# Patient Record
Sex: Female | Born: 1950 | Race: Black or African American | Hispanic: No | Marital: Married | Smoking: Never smoker
Health system: Southern US, Community
[De-identification: ages and names within clinical notes are randomized; demographics above are authoritative.]

## PROBLEM LIST (undated history)

## (undated) DIAGNOSIS — E119 Type 2 diabetes mellitus without complications: Secondary | ICD-10-CM

## (undated) DIAGNOSIS — I499 Cardiac arrhythmia, unspecified: Secondary | ICD-10-CM

## (undated) DIAGNOSIS — E041 Nontoxic single thyroid nodule: Secondary | ICD-10-CM

## (undated) DIAGNOSIS — K5790 Diverticulosis of intestine, part unspecified, without perforation or abscess without bleeding: Secondary | ICD-10-CM

## (undated) DIAGNOSIS — Z79899 Other long term (current) drug therapy: Secondary | ICD-10-CM

## (undated) DIAGNOSIS — Z7901 Long term (current) use of anticoagulants: Secondary | ICD-10-CM

## (undated) DIAGNOSIS — N12 Tubulo-interstitial nephritis, not specified as acute or chronic: Secondary | ICD-10-CM

## (undated) DIAGNOSIS — E785 Hyperlipidemia, unspecified: Secondary | ICD-10-CM

## (undated) DIAGNOSIS — K603 Anal fistula, unspecified: Secondary | ICD-10-CM

## (undated) DIAGNOSIS — N186 End stage renal disease: Secondary | ICD-10-CM

## (undated) DIAGNOSIS — I517 Cardiomegaly: Secondary | ICD-10-CM

## (undated) DIAGNOSIS — I440 Atrioventricular block, first degree: Secondary | ICD-10-CM

## (undated) DIAGNOSIS — I4510 Unspecified right bundle-branch block: Secondary | ICD-10-CM

## (undated) DIAGNOSIS — E1140 Type 2 diabetes mellitus with diabetic neuropathy, unspecified: Secondary | ICD-10-CM

## (undated) DIAGNOSIS — N200 Calculus of kidney: Secondary | ICD-10-CM

## (undated) DIAGNOSIS — E538 Deficiency of other specified B group vitamins: Secondary | ICD-10-CM

## (undated) DIAGNOSIS — N189 Chronic kidney disease, unspecified: Secondary | ICD-10-CM

## (undated) DIAGNOSIS — D649 Anemia, unspecified: Secondary | ICD-10-CM

## (undated) DIAGNOSIS — I4891 Unspecified atrial fibrillation: Secondary | ICD-10-CM

## (undated) DIAGNOSIS — R112 Nausea with vomiting, unspecified: Secondary | ICD-10-CM

## (undated) DIAGNOSIS — R011 Cardiac murmur, unspecified: Secondary | ICD-10-CM

## (undated) DIAGNOSIS — I38 Endocarditis, valve unspecified: Secondary | ICD-10-CM

## (undated) DIAGNOSIS — I350 Nonrheumatic aortic (valve) stenosis: Secondary | ICD-10-CM

## (undated) DIAGNOSIS — K289 Gastrojejunal ulcer, unspecified as acute or chronic, without hemorrhage or perforation: Secondary | ICD-10-CM

## (undated) DIAGNOSIS — E139 Other specified diabetes mellitus without complications: Secondary | ICD-10-CM

## (undated) DIAGNOSIS — G709 Myoneural disorder, unspecified: Secondary | ICD-10-CM

## (undated) DIAGNOSIS — D638 Anemia in other chronic diseases classified elsewhere: Secondary | ICD-10-CM

## (undated) DIAGNOSIS — M4802 Spinal stenosis, cervical region: Secondary | ICD-10-CM

## (undated) DIAGNOSIS — Z940 Kidney transplant status: Secondary | ICD-10-CM

## (undated) DIAGNOSIS — I10 Essential (primary) hypertension: Secondary | ICD-10-CM

## (undated) DIAGNOSIS — Z9889 Other specified postprocedural states: Secondary | ICD-10-CM

## (undated) DIAGNOSIS — Z7960 Long term (current) use of unspecified immunomodulators and immunosuppressants: Secondary | ICD-10-CM

## (undated) DIAGNOSIS — M810 Age-related osteoporosis without current pathological fracture: Secondary | ICD-10-CM

## (undated) DIAGNOSIS — E039 Hypothyroidism, unspecified: Secondary | ICD-10-CM

## (undated) DIAGNOSIS — R0600 Dyspnea, unspecified: Secondary | ICD-10-CM

## (undated) HISTORY — PX: A/V FISTULAGRAM: CATH118298

## (undated) HISTORY — PX: KIDNEY TRANSPLANT: SHX239

---

## 1998-12-26 HISTORY — PX: KIDNEY TRANSPLANT: SHX239

## 2009-02-04 HISTORY — PX: KIDNEY TRANSPLANT: SHX239

## 2014-07-11 HISTORY — PX: CARPAL TUNNEL RELEASE: SHX101

## 2018-07-11 HISTORY — PX: EYE SURGERY: SHX253

## 2018-09-21 DIAGNOSIS — Z940 Kidney transplant status: Secondary | ICD-10-CM | POA: Insufficient documentation

## 2018-09-21 DIAGNOSIS — I4891 Unspecified atrial fibrillation: Secondary | ICD-10-CM | POA: Insufficient documentation

## 2018-09-21 DIAGNOSIS — M810 Age-related osteoporosis without current pathological fracture: Secondary | ICD-10-CM | POA: Insufficient documentation

## 2020-03-11 HISTORY — PX: CATARACT EXTRACTION W/ INTRAOCULAR LENS IMPLANT: SHX1309

## 2020-10-15 ENCOUNTER — Encounter: Payer: Self-pay | Admitting: Ophthalmology

## 2020-10-19 ENCOUNTER — Other Ambulatory Visit: Admission: RE | Admit: 2020-10-19 | Payer: 59 | Source: Ambulatory Visit

## 2020-10-20 NOTE — Discharge Instructions (Signed)

## 2020-10-21 ENCOUNTER — Encounter
Admission: RE | Disposition: A | Discharge status: Home / Self Care | Payer: Self-pay | Source: Home / Self Care | Attending: Ophthalmology

## 2020-10-21 ENCOUNTER — Encounter: Payer: Self-pay | Admitting: Ophthalmology

## 2020-10-21 ENCOUNTER — Ambulatory Visit
Admission: RE | Admit: 2020-10-21 | Discharge: 2020-10-21 | Disposition: A | Discharge status: Home / Self Care | Payer: Medicare Other | Attending: Ophthalmology | Admitting: Ophthalmology

## 2020-10-21 ENCOUNTER — Ambulatory Visit: Payer: Medicare Other | Admitting: Anesthesiology

## 2020-10-21 ENCOUNTER — Other Ambulatory Visit: Payer: Self-pay

## 2020-10-21 DIAGNOSIS — Z940 Kidney transplant status: Secondary | ICD-10-CM | POA: Diagnosis not present

## 2020-10-21 DIAGNOSIS — Z7952 Long term (current) use of systemic steroids: Secondary | ICD-10-CM | POA: Diagnosis not present

## 2020-10-21 DIAGNOSIS — Z79899 Other long term (current) drug therapy: Secondary | ICD-10-CM | POA: Diagnosis not present

## 2020-10-21 DIAGNOSIS — H2512 Age-related nuclear cataract, left eye: Secondary | ICD-10-CM | POA: Insufficient documentation

## 2020-10-21 DIAGNOSIS — E1136 Type 2 diabetes mellitus with diabetic cataract: Secondary | ICD-10-CM | POA: Insufficient documentation

## 2020-10-21 DIAGNOSIS — Z794 Long term (current) use of insulin: Secondary | ICD-10-CM | POA: Diagnosis not present

## 2020-10-21 DIAGNOSIS — Z7901 Long term (current) use of anticoagulants: Secondary | ICD-10-CM | POA: Insufficient documentation

## 2020-10-21 HISTORY — PX: CATARACT EXTRACTION W/PHACO: SHX586

## 2020-10-21 HISTORY — DX: Type 2 diabetes mellitus without complications: E11.9

## 2020-10-21 HISTORY — DX: Unspecified atrial fibrillation: I48.91

## 2020-10-21 HISTORY — DX: Essential (primary) hypertension: I10

## 2020-10-21 HISTORY — DX: Myoneural disorder, unspecified: G70.9

## 2020-10-21 LAB — GLUCOSE, CAPILLARY
Glucose-Capillary: 148 mg/dL — ABNORMAL HIGH (ref 70–99)
Glucose-Capillary: 72 mg/dL (ref 70–99)
Glucose-Capillary: 92 mg/dL (ref 70–99)

## 2020-10-21 SURGERY — PHACOEMULSIFICATION, CATARACT, WITH IOL INSERTION
Anesthesia: Monitor Anesthesia Care | Site: Eye | Laterality: Left

## 2020-10-21 MED ORDER — MIDAZOLAM HCL 2 MG/2ML IJ SOLN
INTRAMUSCULAR | Status: DC | PRN
  Administered 2020-10-21: 1 mg via INTRAVENOUS

## 2020-10-21 MED ORDER — LIDOCAINE HCL (PF) 2 % IJ SOLN
INTRAOCULAR | Status: DC | PRN
  Administered 2020-10-21: 1 mL

## 2020-10-21 MED ORDER — FENTANYL CITRATE (PF) 100 MCG/2ML IJ SOLN
INTRAMUSCULAR | Status: DC | PRN
  Administered 2020-10-21 (×2): 50 ug via INTRAVENOUS

## 2020-10-21 MED ORDER — TETRACAINE HCL 0.5 % OP SOLN
1.0000 [drp] | OPHTHALMIC | Status: DC | PRN
  Administered 2020-10-21 (×3): 1 [drp] via OPHTHALMIC

## 2020-10-21 MED ORDER — EPINEPHRINE PF 1 MG/ML IJ SOLN
INTRAOCULAR | Status: DC | PRN
  Administered 2020-10-21: 77 mL via OPHTHALMIC

## 2020-10-21 MED ORDER — NA HYALUR & NA CHOND-NA HYALUR 0.4-0.35 ML IO KIT
PACK | INTRAOCULAR | Status: DC | PRN
  Administered 2020-10-21: 1 mL via INTRAOCULAR

## 2020-10-21 MED ORDER — DEXTROSE 50 % IV SOLN
25.0000 mL | Freq: Once | INTRAVENOUS | Status: AC
  Administered 2020-10-21: 25 mL via INTRAVENOUS

## 2020-10-21 MED ORDER — BRIMONIDINE TARTRATE-TIMOLOL 0.2-0.5 % OP SOLN
OPHTHALMIC | Status: DC | PRN
  Administered 2020-10-21: 1 [drp] via OPHTHALMIC

## 2020-10-21 MED ORDER — ARMC OPHTHALMIC DILATING DROPS
1.0000 "application " | OPHTHALMIC | Status: DC | PRN
  Administered 2020-10-21 (×3): 1 via OPHTHALMIC

## 2020-10-21 MED ORDER — MOXIFLOXACIN HCL 0.5 % OP SOLN
OPHTHALMIC | Status: DC | PRN
  Administered 2020-10-21: 0.2 mL via OPHTHALMIC

## 2020-10-21 SURGICAL SUPPLY — 24 items
CANNULA ANT/CHMB 27GA (MISCELLANEOUS) ×2 IMPLANT
GLOVE SURG TRIUMPH 8.0 PF LTX (GLOVE) ×6 IMPLANT
GOWN STRL REUS W/ TWL LRG LVL3 (GOWN DISPOSABLE) ×3 IMPLANT
GOWN STRL REUS W/TWL LRG LVL3 (GOWN DISPOSABLE) ×6
LENS IOL ACRSF IQ ULTRA 16.5 (Intraocular Lens) ×1 IMPLANT
LENS IOL ACRYSOF IQ 16.5 (Intraocular Lens) ×2 IMPLANT
MARKER SKIN DUAL TIP RULER LAB (MISCELLANEOUS) ×2 IMPLANT
NDL RETROBULBAR .5 NSTRL (NEEDLE) IMPLANT
NEEDLE CAPSULORHEX 25GA (NEEDLE) ×2 IMPLANT
NEEDLE FILTER BLUNT 18X 1/2SAF (NEEDLE) ×2
NEEDLE FILTER BLUNT 18X1 1/2 (NEEDLE) ×2 IMPLANT
PACK CATARACT BRASINGTON (MISCELLANEOUS) ×2 IMPLANT
PACK EYE AFTER SURG (MISCELLANEOUS) ×2 IMPLANT
PACK OPTHALMIC (MISCELLANEOUS) ×2 IMPLANT
RING MALYGIN 7.0 (MISCELLANEOUS) IMPLANT
SOLUTION OPHTHALMIC SALT (MISCELLANEOUS) ×2 IMPLANT
SUT ETHILON 10-0 CS-B-6CS-B-6 (SUTURE)
SUT VICRYL  9 0 (SUTURE)
SUT VICRYL 9 0 (SUTURE) IMPLANT
SUTURE EHLN 10-0 CS-B-6CS-B-6 (SUTURE) IMPLANT
SYR 3ML LL SCALE MARK (SYRINGE) ×4 IMPLANT
SYR TB 1ML LUER SLIP (SYRINGE) ×2 IMPLANT
WATER STERILE IRR 250ML POUR (IV SOLUTION) ×2 IMPLANT
WIPE NON LINTING 3.25X3.25 (MISCELLANEOUS) ×2 IMPLANT

## 2020-10-21 NOTE — H&P (Signed)
[facility]   Primary Care Physician:  Ezequiel Kayser, MD Ophthalmologist: Dr. Leandrew Koyanagi  Pre-Procedure History & Physical: HPI:  Jean Johnson is a 70 y.o. female here for ophthalmic surgery.   Past Medical History:  Diagnosis Date  . Afib ([place])    history of   . Diabetes mellitus without complication ([place])   . Hypertension   . Neuromuscular disorder ([place])    neuropathy    Past Surgical History:  Procedure Laterality Date  . KIDNEY TRANSPLANT     x 2     Prior to Admission medications   Medication Sig Start Date End Date Taking? Authorizing Provider  apixaban (ELIQUIS) 5 MG TABS tablet Take 5 mg by mouth 2 (two) times daily.   Yes [provider]  atorvastatin (LIPITOR) 10 MG tablet Take 10 mg by mouth daily.   Yes [provider]  diltiazem (CARDIZEM CD) 240 MG 24 hr capsule Take 240 mg by mouth daily.   Yes [provider]  ezetimibe (ZETIA) 10 MG tablet Take 10 mg by mouth daily.   Yes [provider]  flecainide (TAMBOCOR) 100 MG tablet Take 100 mg by mouth 2 (two) times daily.   Yes [provider]  insulin detemir (LEVEMIR) 100 UNIT/ML injection Inject 8-10 Units into the skin at bedtime.   Yes [provider]  insulin lispro (HUMALOG) 100 UNIT/ML KwikPen Inject into the skin in the morning and at bedtime.   Yes [provider]  lisinopril (ZESTRIL) 30 MG tablet Take 30 mg by mouth daily.   Yes [provider]  mycophenolate (CELLCEPT) 500 MG tablet Take by mouth 2 (two) times daily.   Yes [provider]  Potassium Citrate POWD by Does not apply route.   Yes [provider]  predniSONE (DELTASONE) 5 MG tablet Take 5 mg by mouth daily with breakfast.   Yes [provider]  tacrolimus (PROGRAF) 1 MG capsule Take 1 mg by mouth 2 (two) times daily.   Yes [provider]    Allergies as of 09/23/2020  . (Not on File)    History reviewed. No  pertinent family history.  Social History   Socioeconomic History  . Marital status: Married    Spouse name: Not on file  . Number of children: Not on file  . Years of education: Not on file  . Highest education level: Not on file  Occupational History  . Not on file  Tobacco Use  . Smoking status: Never Smoker  . Smokeless tobacco: Never Used  Substance and Sexual Activity  . Alcohol use: Not Currently  . Drug use: Not on file  . Sexual activity: Not on file  Other Topics Concern  . Not on file  Social History Narrative  . Not on file   Social Determinants of Health   Financial Resource Strain: Not on file  Food Insecurity: Not on file  Transportation Needs: Not on file  Physical Activity: Not on file  Stress: Not on file  Social Connections: Not on file  Intimate Partner Violence: Not on file    Review of Systems: See HPI, otherwise negative ROS  Physical Exam: BP (!) 184/69   Pulse 64   Temp 97.6 F (36.4 C)   Ht '5\' 1"'$  (1.549 m)   Wt 51.3 kg   SpO2 98%   BMI 21.35 kg/m  General:   Alert,  pleasant and cooperative in NAD Head:  Normocephalic and atraumatic. Lungs:  Clear  to auscultation.    Heart:  Regular rate and rhythm.   Impression/Plan: Jean Johnson is here for ophthalmic surgery.  Risks, benefits, limitations, and alternatives regarding ophthalmic surgery have been reviewed with the patient.  Questions have been answered.  All parties agreeable.   Leandrew Koyanagi, MD  10/21/2020, 11:56 AM

## 2020-10-21 NOTE — Op Note (Signed)
OPERATIVE NOTE  Emojean Wineberg [id number] 10/21/2020   PREOPERATIVE DIAGNOSIS:  Nuclear sclerotic cataract left eye. H25.12   POSTOPERATIVE DIAGNOSIS:    Nuclear sclerotic cataract left eye.     PROCEDURE:  Phacoemusification with posterior chamber intraocular lens placement of the left eye  Ultrasound time: Procedure(s) with comments: CATARACT EXTRACTION PHACO AND INTRAOCULAR LENS PLACEMENT (IOC) DIABETES LEFT (Left) - 2.84 0:48.8 5.8%  LENS:   Implant Name Type Inv. Item Serial No. Manufacturer Lot No. LRB No. Used Action  LENS IOL ACRYSOF IQ 16.5 - [id number] Intraocular Lens LENS IOL ACRYSOF IQ 16.5 [id number] ALCON  Left 1 Implanted      SURGEON:  Wyonia Hough, MD   ANESTHESIA:  Topical with tetracaine drops and 2% Xylocaine jelly, augmented with 1% preservative-free intracameral lidocaine.    COMPLICATIONS:  None.   DESCRIPTION OF PROCEDURE:  The patient was identified in the holding room and transported to the operating room and placed in the supine position under the operating microscope.  The left eye was identified as the operative eye and it was prepped and draped in the usual sterile ophthalmic fashion.   A 1 millimeter clear-corneal paracentesis was made at the 1:30 position.  0.5 ml of preservative-free 1% lidocaine was injected into the anterior chamber.  The anterior chamber was filled with Viscoat viscoelastic.  A 2.4 millimeter keratome was used to make a near-clear corneal incision at the 10:30 position.  .  A curvilinear capsulorrhexis was made with a cystotome and capsulorrhexis forceps.  Balanced salt solution was used to hydrodissect and hydrodelineate the nucleus.   Phacoemulsification was then used in stop and chop fashion to remove the lens nucleus and epinucleus.  The remaining cortex was then removed using the irrigation and aspiration handpiece. Provisc was then placed into the capsular bag to distend it for lens placement.  A lens was then  injected into the capsular bag.  The remaining viscoelastic was aspirated.   Wounds were hydrated with balanced salt solution.  The anterior chamber was inflated to a physiologic pressure with balanced salt solution.  No wound leaks were noted. Vigamox 0.2 ml of a '1mg'$  per ml solution was injected into the anterior chamber for a dose of 0.2 mg of intracameral antibiotic at the completion of the case.   Timolol and Brimonidine drops were applied to the eye.  The patient was taken to the recovery room in stable condition without complications of anesthesia or surgery.  Gillian Meeuwsen 10/21/2020, 1:21 PM

## 2020-10-21 NOTE — Anesthesia Preprocedure Evaluation (Signed)
Anesthesia Evaluation  Patient identified by MRN, date of birth, ID band Patient awake    History of Anesthesia Complications Negative for: history of anesthetic complications  Airway Mallampati: II  TM Distance: >3 FB Neck ROM: Full    Dental no notable dental hx.    Pulmonary neg pulmonary ROS,    Pulmonary exam normal        Cardiovascular Exercise Tolerance: Good hypertension, Pt. 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)     Musculoskeletal   Abdominal   Peds  Hematology negative hematology ROS (+)   Anesthesia Other Findings   Reproductive/Obstetrics                             Anesthesia Physical Anesthesia Plan  ASA: III  Anesthesia Plan: MAC   Post-op Pain Management:    Induction: Intravenous  PONV Risk Score and Plan: 2 and Midazolam, TIVA and Treatment may vary due to age or medical condition  Airway Management Planned: Nasal Cannula and Natural Airway  Additional Equipment: None  Intra-op Plan:   Post-operative Plan:   Informed Consent: I have reviewed the patients History and Physical, chart, labs and discussed the procedure including the risks, benefits and alternatives for the proposed anesthesia with the patient or authorized representative who has indicated his/her understanding and acceptance.       Plan Discussed with: CRNA  Anesthesia Plan Comments:         Anesthesia Quick Evaluation

## 2020-10-21 NOTE — Anesthesia Procedure Notes (Signed)
Procedure Name: MAC Performed by: Demetrice Amstutz, CRNA Pre-anesthesia Checklist: Patient identified, Emergency Drugs available, Suction available, Timeout performed and Patient being monitored Patient Re-evaluated:Patient Re-evaluated prior to induction Oxygen Delivery Method: Nasal cannula Placement Confirmation: positive ETCO2       

## 2020-10-21 NOTE — Anesthesia Postprocedure Evaluation (Signed)
Anesthesia Post Note  Patient: Jean Johnson  Procedure(s) Performed: CATARACT EXTRACTION PHACO AND INTRAOCULAR LENS PLACEMENT (IOC) DIABETES LEFT (Left Eye)     Patient location during evaluation: PACU Anesthesia Type: MAC Level of consciousness: awake and alert Pain management: pain level controlled Vital Signs Assessment: post-procedure vital signs reviewed and stable Respiratory status: spontaneous breathing Cardiovascular status: blood pressure returned to baseline Postop Assessment: no apparent nausea or vomiting, adequate PO intake and no headache Anesthetic complications: no   No complications documented.  Adele Barthel Sabino Denning

## 2020-10-21 NOTE — Transfer of Care (Signed)
Immediate Anesthesia Transfer of Care Note  Patient: Jean Johnson  Procedure(s) Performed: CATARACT EXTRACTION PHACO AND INTRAOCULAR LENS PLACEMENT (IOC) DIABETES LEFT (Left Eye)  Patient Location: PACU  Anesthesia Type: MAC  Level of Consciousness: awake, alert  and patient cooperative  Airway and Oxygen Therapy: Patient Spontanous Breathing and Patient connected to supplemental oxygen  Post-op Assessment: Post-op Vital signs reviewed, Patient's Cardiovascular Status Stable, Respiratory Function Stable, Patent Airway and No signs of Nausea or vomiting  Post-op Vital Signs: Reviewed and stable  Complications: No complications documented.

## 2020-10-22 ENCOUNTER — Encounter: Payer: Self-pay | Admitting: Ophthalmology

## 2020-11-16 ENCOUNTER — Other Ambulatory Visit: Payer: Self-pay

## 2020-11-16 ENCOUNTER — Ambulatory Visit (INDEPENDENT_AMBULATORY_CARE_PROVIDER_SITE_OTHER): Payer: Medicare Other | Admitting: Urology

## 2020-11-16 VITALS — BP 165/73 | HR 71 | Ht 61.0 in | Wt 112.0 lb

## 2020-11-16 DIAGNOSIS — N3941 Urge incontinence: Secondary | ICD-10-CM | POA: Diagnosis not present

## 2020-11-16 LAB — BLADDER SCAN AMB NON-IMAGING

## 2020-11-16 NOTE — Progress Notes (Signed)
11/16/2020 2:11 PM   Jean Johnson [birth year] [id number]  Referring provider: Ezequiel Kayser, MD [facility] [place],  [place] [address]  Chief Complaint  Patient presents with  . Urinary Incontinence    HPI: I was consulted to assess the patient who moved here from [place].  She has noticed some bulging in the vagina from her uterus for 18 months.  She has mild incontinence and wanted to find a urologist  Sometimes she has urgency incontinence.  No stress incontinence or bedwetting.  Wears 2 depends a day.  She thinks usually they are damp and some of the history was difficult to sort out at times.  She voids every 2 or 3 hours and gets up at least 3 times a night.  She has ankle edema.  She has had 2 kidney transplant 1 in year 2001 and 2010.  She has a shunt but does not do dialysis  Has not had a hysterectomy.  Is an insulin-dependent diabetic and may need neck surgery  No previous bladder surgery.       PMH: Past Medical History:  Diagnosis Date  . Afib ([place])    history of   . Diabetes mellitus without complication ([place])   . Hypertension   . Neuromuscular disorder [facility])    neuropathy    Surgical History: Past Surgical History:  Procedure Laterality Date  . CATARACT EXTRACTION W/PHACO Left 10/21/2020   Procedure: CATARACT EXTRACTION PHACO AND INTRAOCULAR LENS PLACEMENT ([place]) DIABETES LEFT;  Surgeon: Leandrew Koyanagi, MD;  Location: [facility];  Service: Ophthalmology;  Laterality: Left;  2.84 0:48.8 5.8%  . KIDNEY TRANSPLANT     x 2     Home Medications:  Allergies as of 11/16/2020      Reactions   E-mycin [erythromycin] Swelling   And itching   Penicillins Swelling   Throat      Medication List       Accurate as of Nov 16, 2020  2:11 PM. If you have any questions, ask your nurse or doctor.        atorvastatin 10 MG tablet Commonly known as: LIPITOR Take 10 mg by mouth daily.   diltiazem 240 MG 24 hr  capsule Commonly known as: CARDIZEM CD Take 240 mg by mouth daily.   Eliquis 5 MG Tabs tablet Generic drug: apixaban Take 5 mg by mouth 2 (two) times daily.   ezetimibe 10 MG tablet Commonly known as: ZETIA Take 10 mg by mouth daily.   flecainide 100 MG tablet Commonly known as: TAMBOCOR Take 100 mg by mouth 2 (two) times daily.   insulin detemir 100 UNIT/ML injection Commonly known as: LEVEMIR Inject 8-10 Units into the skin at bedtime.   insulin lispro 100 UNIT/ML KwikPen Commonly known as: HUMALOG Inject into the skin in the morning and at bedtime.   lisinopril 30 MG tablet Commonly known as: ZESTRIL Take 30 mg by mouth daily.   mycophenolate 500 MG tablet Commonly known as: CELLCEPT Take by mouth 2 (two) times daily.   Potassium Citrate Powd by Does not apply route.   predniSONE 5 MG tablet Commonly known as: DELTASONE Take 5 mg by mouth daily with breakfast.   tacrolimus 1 MG capsule Commonly known as: PROGRAF Take 1 mg by mouth 2 (two) times daily.       Allergies:  Allergies  Allergen Reactions  . E-Mycin [Erythromycin] Swelling    And itching   . Penicillins Swelling  Throat     Family History: No family history on file.  Social History:  reports that she has never smoked. She has never used smokeless tobacco. She reports previous alcohol use. No history on file for drug use.  ROS:                                        Physical Exam: BP (!) 165/73   Pulse 71   Ht '5\' 1"'$  (1.549 m)   Wt 50.8 kg   BMI 21.16 kg/m   Constitutional:  Alert and oriented, No acute distress. HEENT: [place] AT, moist mucus membranes.  Trachea midline, no masses. Cardiovascular: No clubbing, cyanosis, or edema. Respiratory: Normal respiratory effort, no increased work of breathing. GI: Abdomen is soft, nontender, nondistended, no abdominal masses GU: On pelvic examination patient has a grade 3 cystocele with some shortening of the anterior  vaginal wall.  The uterus and cervix descended to the introitus.  No stress incontinence with prolapse reduced.  No rectocele with prolapse reduced. Skin: No rashes, bruises or suspicious lesions. Lymph: No cervical or inguinal adenopathy. Neurologic: Grossly intact, no focal deficits, moving all 4 extremities. Psychiatric: Normal mood and affect.  Laboratory Data: No results found for: WBC, HGB, HCT, MCV, PLT  No results found for: CREATININE  No results found for: PSA  No results found for: TESTOSTERONE  No results found for: HGBA1C  Urinalysis No results found for: COLORURINE, APPEARANCEUR, LABSPEC, PHURINE, GLUCOSEU, HGBUR, BILIRUBINUR, KETONESUR, PROTEINUR, UROBILINOGEN, NITRITE, LEUKOCYTESUR  Pertinent Imaging: Urine reviewed.  Urine sent for culture.  Chart reviewed.  Post void residual 113 mL  Assessment & Plan: Patient has mild urge incontinence.  She has prolapse symptoms.  She has had 2 kidney transplant.  She uses a cane at home but otherwise a wheelchair when she goes out in public  Picture was drawn.  Watchful waiting and pessary recommended.  In my opinion prolapse surgery would not be a good option with her renal transplants.  She actually tolerates her symptoms very well.  The role of medical therapy for overactive bladder discussed.  Recent lab test noted that her glomerular filtration rate was 66 mils per minute  I was not surprised and patient chose watchful waiting See primary care for hemorrhoid and mild rectal prolapse  1. Urge incontinence of urine  - Urinalysis, Complete - Bladder Scan (Post Void Residual) in office   No follow-ups on file.  Reece Packer, MD  [name] [address] [place], [place] [address] [phone]

## 2020-11-17 LAB — MICROSCOPIC EXAMINATION: Bacteria, UA: NONE SEEN

## 2020-11-17 LAB — URINALYSIS, COMPLETE
Bilirubin, UA: NEGATIVE
Glucose, UA: NEGATIVE
Ketones, UA: NEGATIVE
Leukocytes,UA: NEGATIVE
Nitrite, UA: NEGATIVE
Specific Gravity, UA: >1.03 — ABNORMAL HIGH (ref 1.005–1.030)
Urobilinogen, Ur: 0.2 mg/dL (ref 0.2–1.0)
pH, UA: 6 (ref 5.0–7.5)

## 2020-11-20 LAB — CULTURE, URINE COMPREHENSIVE

## 2020-12-03 DIAGNOSIS — E119 Type 2 diabetes mellitus without complications: Secondary | ICD-10-CM | POA: Insufficient documentation

## 2020-12-03 DIAGNOSIS — D649 Anemia, unspecified: Secondary | ICD-10-CM | POA: Insufficient documentation

## 2020-12-03 DIAGNOSIS — I10 Essential (primary) hypertension: Secondary | ICD-10-CM | POA: Insufficient documentation

## 2020-12-03 DIAGNOSIS — E039 Hypothyroidism, unspecified: Secondary | ICD-10-CM | POA: Insufficient documentation

## 2020-12-10 ENCOUNTER — Other Ambulatory Visit: Payer: Self-pay | Admitting: Nephrology

## 2020-12-10 ENCOUNTER — Other Ambulatory Visit (HOSPITAL_COMMUNITY): Payer: Self-pay | Admitting: Nephrology

## 2020-12-10 DIAGNOSIS — E876 Hypokalemia: Secondary | ICD-10-CM

## 2020-12-10 DIAGNOSIS — D631 Anemia in chronic kidney disease: Secondary | ICD-10-CM

## 2020-12-10 DIAGNOSIS — N189 Chronic kidney disease, unspecified: Secondary | ICD-10-CM

## 2020-12-10 DIAGNOSIS — R8290 Unspecified abnormal findings in urine: Secondary | ICD-10-CM

## 2020-12-16 ENCOUNTER — Other Ambulatory Visit (HOSPITAL_COMMUNITY): Payer: Self-pay | Admitting: Neurology

## 2020-12-16 ENCOUNTER — Other Ambulatory Visit: Payer: Self-pay | Admitting: Neurology

## 2020-12-16 DIAGNOSIS — M47812 Spondylosis without myelopathy or radiculopathy, cervical region: Secondary | ICD-10-CM

## 2020-12-29 ENCOUNTER — Ambulatory Visit
Admission: RE | Admit: 2020-12-29 | Discharge: 2020-12-29 | Disposition: A | Discharge status: Home / Self Care | Payer: Medicare Other | Source: Ambulatory Visit | Attending: Neurology | Admitting: Neurology

## 2020-12-29 ENCOUNTER — Other Ambulatory Visit: Payer: Self-pay

## 2020-12-29 DIAGNOSIS — M47812 Spondylosis without myelopathy or radiculopathy, cervical region: Secondary | ICD-10-CM | POA: Insufficient documentation

## 2021-01-08 ENCOUNTER — Ambulatory Visit
Admission: RE | Admit: 2021-01-08 | Discharge: 2021-01-08 | Disposition: A | Discharge status: Home / Self Care | Payer: Medicare Other | Source: Ambulatory Visit | Attending: Nephrology | Admitting: Nephrology

## 2021-01-08 ENCOUNTER — Other Ambulatory Visit: Payer: Self-pay

## 2021-01-08 DIAGNOSIS — D631 Anemia in chronic kidney disease: Secondary | ICD-10-CM | POA: Diagnosis present

## 2021-01-08 DIAGNOSIS — E876 Hypokalemia: Secondary | ICD-10-CM

## 2021-01-08 DIAGNOSIS — N189 Chronic kidney disease, unspecified: Secondary | ICD-10-CM | POA: Insufficient documentation

## 2021-01-08 DIAGNOSIS — R8290 Unspecified abnormal findings in urine: Secondary | ICD-10-CM

## 2021-01-28 ENCOUNTER — Other Ambulatory Visit: Payer: Self-pay

## 2021-01-28 ENCOUNTER — Ambulatory Visit: Payer: Medicare Other | Attending: Neurology | Admitting: Physical Therapy

## 2021-01-28 DIAGNOSIS — R2689 Other abnormalities of gait and mobility: Secondary | ICD-10-CM | POA: Diagnosis present

## 2021-01-28 DIAGNOSIS — R262 Difficulty in walking, not elsewhere classified: Secondary | ICD-10-CM | POA: Diagnosis present

## 2021-01-28 DIAGNOSIS — R42 Dizziness and giddiness: Secondary | ICD-10-CM | POA: Insufficient documentation

## 2021-01-28 NOTE — Therapy (Signed)
[place] [facility] [address] [place], [place], [address] Phone: [phone]   Fax:  [phone]  Physical Therapy Evaluation  Patient Details  Name: Jean Johnson MRN: [id number] Date of Birth: 1950/09/01 Referring Provider (PT): Dr. Vladimir Crofts  Encounter Date: 01/28/2021   PT End of Session - 01/29/21 1000     Visit Number 1    Number of Visits 17    Date for PT Re-Evaluation 03/25/21    Authorization Time Period initial eval 01/28/21    Progress Note Due on Visit 10    PT Start Time 0948    PT Stop Time 1035    PT Time Calculation (min) 47 min    Activity Tolerance Patient tolerated treatment well    Behavior During Therapy [facility] for tasks assessed/performed             Past Medical History:  Diagnosis Date   Afib ([place])    history of    Diabetes mellitus without complication ([place])    Hypertension    Neuromuscular disorder ([place])    neuropathy    Past Surgical History:  Procedure Laterality Date   CATARACT EXTRACTION W/PHACO Left 10/21/2020   Procedure: CATARACT EXTRACTION PHACO AND INTRAOCULAR LENS PLACEMENT ([place]) DIABETES LEFT;  Surgeon: Leandrew Koyanagi, MD;  Location: [facility];  Service: Ophthalmology;  Laterality: Left;  2.84 0:48.8 5.8%   KIDNEY TRANSPLANT     x 2     There were no vitals filed for this visit.    Subjective Assessment - 01/28/21 0946     Subjective Patient is a 70 year old female with primary complaint of dizziness and imbalance    Pertinent History Patient is a 70 year old female with primary complaint of dizziness and imbalance. She states this got worse around early 2021 (month unspecified). Patient reports feeling off balance. She reports intermittently feeling some sensation of her head spinning.  She reports dizziness can last throughout the day. She states this is happening every day. Pt reports no change in sypmtoms since onset. She states it feels different than  previous bout of vertigo. She states she uses her cane to move about the house. She reports some difficulty with direction of gait - "I'm trying to walk this way, but walk the other way." Patient has hx of chronic kidney disease, bilateral neuropathy affecting LEs and R hand. She has mild numbness affecting her L hand. She feels that Lyrica helps "a little." Patient reports no facial numbness or paresthesias. . Patient denies N&V with dizziness. She reports no notable dysarhtria, some dysphagia with food "not wanting to go down." Patient reports limitation in household cleaning, getting up stairs to access her bedroom (handrail is on left going up). She reports more difficulty with LLE going down steps. She reports using single-point cane for household mobility, walker for going outside. She uses wheelchair for shopping if scooter is unavailable. Patient reports one fall in last 6 months - falling out of chair. Patient reports no auditory complaints. Pt is s/p kidney transplant x 2 and she has prolapsed uterus; she reports some bloating and increased urgency, though this has not changed in the previous year notably. Pt denies syncope or drop attacks. Patient has been assessed for mild neck pain in association with neuropathic pain and dizziness with Dr. Jennings Books - consult sent to neurosurgery with concern for possible myelopathy.    Limitations Walking;House hold activities;Standing    Diagnostic tests Impression from radiology as follows:  At C3-C4, there is grade 1 anterolisthesis. 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 T2 hyperintense signal abnormality within the spinal cord at this level, which may reflect focal edema and/or myelomalacia. Bilateral neural foraminal   narrowing (moderate right, severe left).     At C5-C6, a posterior disc osteophyte complex contributes to severe   spinal canal stenosis (greater on the right) with spinal cord flattening. T2 hyperintense  signal abnormality within the right   aspect of the spinal cord at this level compatible with myelomalacia and/or focal edema. Bilateral neural foraminal narrowing (severe   right, moderate/severe left).     At C7-T1, a posterior disc osteophyte complex contributes to mild/moderate spinal canal stenosis, contacting and minimally flattening the ventral spinal cord. Bilateral neural foraminal   narrowing (moderate/severe right, severe left).  No more than mild spinal canal stenosis at the remaining levels. Additional sites of neural foraminal narrowing (including additional   sites of severe neural foraminal narrowing), as detailed. Reversal of the expected cervical lordosis. Grade 1 anterolisthesis also present at C4-C5.    Patient Stated Goals Improvement in dizziness and ability to get around home                [facility] PT Assessment - 01/29/21 0930       Assessment   Medical Diagnosis Balance disorder    Referring Provider (PT) Dr. Vladimir Crofts    Onset Date/Surgical Date 08/12/19    Next MD Visit Not stated    Prior Therapy None for this condition      Precautions   Precautions Fall    Precaution Comments Wheelchair for community mobility, [facility] in home, walker outside of home      Balance Screen   Has the patient fallen in the past 6 months Yes    Has the patient had a decrease in activity level because of a fear of falling?  Yes    Is the patient reluctant to leave their home because of a fear of falling?  Yes      [place] residence    Additional Comments 2 level home with patient's bedroom upstairs      Prior Function   Level of Independence Independent with household mobility with device      Cognition   Overall Cognitive Status Within Functional Limits for tasks assessed      Standardized Balance Assessment   Standardized Balance Assessment Timed Up and Go Test      Timed Up and Go Test   Normal TUG (seconds) 30               OBJECTIVE EXAMINATION  POSTURE:  Forward head, rounded shoulders posture; increased kyphosis grossly. Sacral sitting position in wheelchair  DERMATOMAL SCREEN: N=normal  Ab=abnormal  Level Dermatome R L  C3 Anterior Neck N N  C4 Top of Shoulder N N  C5 Lateral Upper Arm N N  C6 Lateral Arm/ Thumb A N  C7 Middle Finger A N  C8 4th & 5th Finger A N  T1 Medial Arm N N  L2 Medial thigh/groin N N  L3 Lower thigh/med.knee N N  L4 Medial leg/lat thigh N N  L5 Lat. leg & dorsal foot A A  S1 post/lat foot/thigh/leg N N  S2 Post./med. thigh & leg N N    Cranial Nerves Visual acuity and visual fields are intact  Extraocular muscles are intact, poor targeting and intermittent catch-up saccades Facial  sensation is intact bilaterally  Facial strength is intact bilaterally  Patient does have known hearing loss Palate elevates midline, normal phonation  Shoulder shrug strength is intact  Tongue protrudes midline    SOMATOSENSORY:         Sensation           Intact      Diminished         Absent  Light touch   R digits/distal phalanx 1-5    COORDINATION: Finger to Nose: Mild impairment, intermittent hypometria Heel to Shin: Normal Pronator Drift: Negative Rapid Alternating Movements: Normal Finger to Thumb Opposition: Normal  MUSCULOSKELETAL SCREEN: Cervical Spine ROM: WFL and no increase in pain in all planes (mild "strain"). Decreased cervical spine rotation A/PROM.    Functional Mobility: Contact-guard assist during stand pivot transfer from chair to table; high guard with upper extremities and increased postural sway with mild loss of postural stability during stepping prior to sitting on table. Moderate assist for supine to sit on table with difficulty initiating cervical and trunk flexion.   Gait: With single-point cane, patient demonstrates shortened stance time L>R and decreased heel strike at initial contact with mild instability at loading response. Intermittent  loss of balance during change of direction/180-degree turn.     POSTURAL CONTROL TESTS:   Rhomberg Test Eyes Open: WNL Eyes Closed: 8 seconds    OCULOMOTOR / VESTIBULAR TESTING:  Oculomotor Exam- Room Light  Findings Comments  Ocular Alignment abnormal ptosis bilateral right > left with bilateral raised eye brows skew deviation  Ocular ROM normal   Spontaneous Nystagmus normal   Gaze-Holding Nystagmus normal   End-Gaze Nystagmus normal   Vergence (normal 2-3") normal   Smooth Pursuit abnormal Poor accuracy with horiz and vert smooth pursuits  Cross-Cover Test not examined   Saccades abnormal Decreased velocity, impaired  VOR Cancellation normal   Left Head Impulse abnormal Catch up saccade  Right Head Impulse abnormal Catch up saccade  Static Acuity Not examined   Dynamic Acuity not examined    VOR: horizontal and vertical, impaired with mild loss of gaze stability, mild dizziness reproduced.    BPPV TESTS:  Symptoms Duration Intensity Nystagmus  L Dix-Hallpike None   None  R Dix-Hallpike None   None  L Head Roll      R Head Roll      L Sidelying Test None   None  R Sidelying Test None   None  *Head roll not tested due to ROM restrictions    FUNCTIONAL OUTCOME MEASURES   Results Comments  BERG Not tested/56 (To be completed next visit)   TUG 30 seconds Fall risk, in need of intervention  5TSTS (To be completed next visit)   [place] (To be completed next visit)      Objective measurements completed on examination: See above findings.    ASSESSMENT Clinical Impression: Pt is a pleasant 70 year old female referred for difficulty with balance with primary complaint of dizziness. Clinical exam is not consistent with peripheral etiology and no symptoms are reproduced with provocative maneuvers for BPPV today. PT examination reveals deficits in sensation affecting sensory integration and balance, gait instability, visual dependence for postural stability, and  difficulties with sit to stand, supine to sit, change of direction during gait, and negotiating turns. Pt presents with deficits in equilibrium and non-equilibrium coordination, gait, and balance. Pt will benefit from skilled PT services to address deficits in balance and decrease risk for future falls.      PT  Short Term Goals - 01/29/21 1135       PT SHORT TERM GOAL #1   Title Pt will be independent with HEP in order to improve strength and balance in order to decrease fall risk and improve function at home and work.    Baseline 01/28/21: HEP initiated    Time 3    Period Weeks    Status New    Target Date 02/18/21      PT SHORT TERM GOAL #2   Title Patient will perform independent stand pivot transfer without loss of balance or cueing required from clinician as needed for safe home-level mobility ability to get to her appointments    Baseline 01/28/21: LOB and high guard with significant postural sway during transfer W/C to table    Time 4    Period Weeks    Status New    Target Date 02/25/21               PT Long Term Goals - 01/28/21 1137       PT LONG TERM GOAL #1   Title Patient will demonstrate improved function as evidenced by a score of 51 on FOTO measure for full participation in activities at home and in the community.    Baseline 01/28/21: FOTO 41    Time 8    Period Weeks    Status New    Target Date 03/25/21      PT LONG TERM GOAL #2   Title Pt will improve BERG by at least 3 points in order to demonstrate clinically significant improvement in balance.    Baseline 01/28/21: BERG score to be obtained at next f/u visit    Time 8    Period Weeks    Status New    Target Date 03/25/21      PT LONG TERM GOAL #3   Title Pt will decrease DHI score by at least 18 points in order to demonstrate clinically significant reduction in disability    Baseline 01/28/21: [place] to be obtained at next follow-up visit    Time 8    Period Weeks    Status New    Target Date  03/25/21      PT LONG TERM GOAL #4   Title Patient will improve TUG to 14 seconds or less indicative of decreased risk of falls and improved ability to perform home-level mobility tasks safely    Baseline 01/28/21: TUG 30 seconds    Time 8    Period Weeks    Status New    Target Date 03/25/21                    Plan - 01/29/21 1122     Clinical Impression Statement Clinical Impression: Pt is a pleasant 70 year old female referred for difficulty with balance with primary complaint of dizziness. Clinical exam is not consistent with peripheral etiology and no symptoms are reproduced with provocative maneuvers for BPPV today. PT examination reveals deficits in sensation affecting sensory integration and balance, gait instability, visual dependence for postural stability, and difficulties with sit to stand, supine to sit, change of direction during gait, and negotiating turns. Pt presents with deficits in equilibrium and non-equilibrium coordination, gait, and balance. Pt will benefit from skilled PT services to address deficits in balance and decrease risk for future falls.    Personal Factors and Comorbidities Age;Comorbidity 3+    Comorbidities chronic kidney disease, Type II DM, peripheral neuropathy, hypertension, Hx of A-fib  Examination-Activity Limitations Bed Mobility;Stairs;Stand;Locomotion Level;Transfers    Examination-Participation Restrictions Community Activity;Cleaning    Stability/Clinical Decision Making Unstable/Unpredictable    Clinical Decision Making High    Rehab Potential Good    PT Frequency 2x / week    PT Duration 8 weeks    PT Treatment/Interventions Gait training;Stair training;Functional mobility training;Therapeutic activities;Therapeutic exercise;Balance training;Neuromuscular re-education;Patient/family education    PT Next Visit Plan Further assess myotomes/strength, BERG and 5-times sit to stand. Continue with occulomotor re-training, VOR training,  and initiate formal HEP    PT Home Exercise Plan Not initiated today    Consulted and Agree with Plan of Care Patient             Patient will benefit from skilled therapeutic intervention in order to improve the following deficits and impairments:  Abnormal gait, Postural dysfunction, Difficulty walking, Decreased balance, Decreased coordination  Visit Diagnosis: Imbalance  Difficulty in walking, not elsewhere classified  Dizziness and giddiness     Problem List There are no problems to display for this patient.  Valentina Gu, PT, DPT [id number] Eilleen Kempf 01/29/2021, 11:43 AM  [place] [address] [place], [place], [address] Phone: [phone]   Fax:  [phone]  Name: Alveena Deubler MRN: [id number] Date of Birth: 06-11-1951

## 2021-01-29 ENCOUNTER — Encounter: Payer: Self-pay | Admitting: Physical Therapy

## 2021-02-02 ENCOUNTER — Ambulatory Visit: Payer: Medicare Other | Admitting: Physical Therapy

## 2021-02-04 ENCOUNTER — Other Ambulatory Visit: Payer: Self-pay

## 2021-02-04 ENCOUNTER — Ambulatory Visit: Payer: Medicare Other | Admitting: Physical Therapy

## 2021-02-04 DIAGNOSIS — R2689 Other abnormalities of gait and mobility: Secondary | ICD-10-CM | POA: Diagnosis not present

## 2021-02-04 DIAGNOSIS — R262 Difficulty in walking, not elsewhere classified: Secondary | ICD-10-CM

## 2021-02-04 DIAGNOSIS — R42 Dizziness and giddiness: Secondary | ICD-10-CM

## 2021-02-04 NOTE — Therapy (Signed)
[facility] [address] [place], [place], [address] Phone: [phone]   Fax:  [phone]  Physical Therapy Treatment  Patient Details  Name: Jean Johnson MRN: [id number] Date of Birth: [birth year] Referring Provider (PT): Dr. Vladimir Crofts   Encounter Date: 02/04/2021   PT End of Session - 02/04/21 1103     Visit Number 2    Number of Visits 17    Date for PT Re-Evaluation 03/25/21    Authorization Time Period initial eval 01/28/21    Progress Note Due on Visit 10    PT Start Time 0955    PT Stop Time 1035    PT Time Calculation (min) 40 min    Activity Tolerance Patient tolerated treatment well    Behavior During Therapy [facility] for tasks assessed/performed             Past Medical History:  Diagnosis Date   Afib ([place])    history of    Diabetes mellitus without complication ([place])    Hypertension    Neuromuscular disorder ([place])    neuropathy    Past Surgical History:  Procedure Laterality Date   CATARACT EXTRACTION W/PHACO Left 10/21/2020   Procedure: CATARACT EXTRACTION PHACO AND INTRAOCULAR LENS PLACEMENT ([place]) DIABETES LEFT;  Surgeon: Leandrew Koyanagi, MD;  Location: [place];  Service: Ophthalmology;  Laterality: Left;  2.84 0:48.8 5.8%   KIDNEY TRANSPLANT     x 2     There were no vitals filed for this visit.   Subjective Assessment - 02/04/21 1000     Subjective Patient reports ongoing dizziness this AM. Patient is undergoing consultation with neurosurgery for her cervical spine Feb 18, 2021. Patient reports doing okay after her initial evaluation. Patient reports dizziness with sitting, standing - no dramatic change associated with change in position. She reports doing well with lying in bed.    Pertinent History Patient is a 70 year old female with primary complaint of dizziness and imbalance. She states this got worse around early 2021 (month unspecified). Patient reports feeling off  balance. She reports intermittently feeling some sensation of her head spinning.  She reports dizziness can last throughout the day. She states this is happening every day. Pt reports no change in sypmtoms since onset. She states it feels different than previous bout of vertigo. She states she uses her cane to move about the house. She reports some difficulty with direction of gait - "I'm trying to walk this way, but walk the other way." Patient has hx of chronic kidney disease, bilateral neuropathy affecting LEs and R hand. She has mild numbness affecting her L hand. She feels that Lyrica helps "a little." Patient reports no facial numbness or paresthesias. . Patient denies N&V with dizziness. She reports no notable dysarhtria, some dysphagia with food "not wanting to go down." Patient reports limitation in household cleaning, getting up stairs to access her bedroom (handrail is on left going up). She reports more difficulty with LLE going down steps. She reports using single-point cane for household mobility, walker for going outside. She uses wheelchair for shopping if scooter is unavailable. Patient reports one fall in last 6 months - falling out of chair. Patient reports no auditory complaints. Pt is s/p kidney transplant x 2 and she has prolapsed uterus; she reports some bloating and increased urgency, though this has not changed in the previous year notably. Pt denies syncope or drop attacks. Patient has been assessed for mild neck pain  in association with neuropathic pain and dizziness with Dr. Jennings Books - consult sent to neurosurgery with concern for possible myelopathy.    Limitations Walking;House hold activities;Standing    Diagnostic tests Impression from radiology as follows:    At C3-C4, there is grade 1 anterolisthesis. 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 T2 hyperintense signal abnormality within the spinal cord at this level, which may reflect focal edema  and/or myelomalacia. Bilateral neural foraminal   narrowing (moderate right, severe left).     At C5-C6, a posterior disc osteophyte complex contributes to severe   spinal canal stenosis (greater on the right) with spinal cord flattening. T2 hyperintense signal abnormality within the right   aspect of the spinal cord at this level compatible with myelomalacia and/or focal edema. Bilateral neural foraminal narrowing (severe   right, moderate/severe left).     At C7-T1, a posterior disc osteophyte complex contributes to mild/moderate spinal canal stenosis, contacting and minimally flattening the ventral spinal cord. Bilateral neural foraminal   narrowing (moderate/severe right, severe left).  No more than mild spinal canal stenosis at the remaining levels. Additional sites of neural foraminal narrowing (including additional   sites of severe neural foraminal narrowing), as detailed. Reversal of the expected cervical lordosis. Grade 1 anterolisthesis also present at C4-C5.    Patient Stated Goals Improvement in dizziness and ability to get around home                  FUNCTIONAL OUTCOME MEASURES       Results Comments  BERG 02/04/21: 38/56  Fall risk, in need of intervention  TUG 01/28/21: 30 seconds  Fall risk, in need of intervention  5TSTS 02/04/21: 27 seconds    Fall risk, in need of intervention  DHI 19        Physical Performance Testing (see above)   Neuromuscular Re-education - for occulomotor re-training, VOR training, postural stability and balance re-training  VOR x 1 in sitting; 2x20; horizontal and vertical Sit to stand; 2x5  HEP update for VOR re-training; see Access Code under Home Program  *next visit* Smooth pursuits with visual target manipulated by therapist; horiz and vertical;   ASSESSMENT Patient arrives with excellent motivation to participate in physical therapy. She is undergoing consultation for cervical myelopathy. Patient had no reproduction of  symptoms with cervical screen at IE and has been instructed on utilizing pain-free and symptom-free ROM only and signs/symptoms that would necessitate stopping VOR work requiring small degree of head turning. She completed BERG, 5 times STS, and DHI today. BERG and 5TSTS further indicating fall risk and need of intervention. She exhibits good targeting during VOR x 1 performed in sitting today. Patient has remaining deficits in postural stability, occulomotor impairments, dizziness, and gait necessitating further PT. Patient will benefit from continued skilled therapeutic intervention to address the above deficits as needed for improved function and QoL.        PT Short Term Goals - 02/04/21 1053       PT SHORT TERM GOAL #1   Title Pt will be independent with HEP in order to improve strength and balance in order to decrease fall risk and improve function at home and work.    Baseline 02/04/21: HEP initiated at second follow-up visit    Time 3    Period Weeks    Status New    Target Date 02/18/21      PT SHORT TERM GOAL #2   Title Patient will perform  independent stand pivot transfer without loss of balance or cueing required from clinician as needed for safe home-level mobility ability to get to her appointments    Baseline 01/28/21: LOB and high guard with significant postural sway during transfer W/C to table    Time 4    Period Weeks    Status New    Target Date 02/25/21               PT Long Term Goals - 02/04/21 1054       PT LONG TERM GOAL #1   Title Patient will demonstrate improved function as evidenced by a score of 51 on FOTO measure for full participation in activities at home and in the community.    Baseline 01/28/21: FOTO 41    Time 8    Period Weeks    Status New    Target Date 03/25/21      PT LONG TERM GOAL #2   Title Pt will improve BERG by at least 3 points in order to demonstrate clinically significant improvement in balance.    Baseline 01/28/21: BERG  score to be obtained at next f/u visit; 02/04/21: BERG 38/56    Time 8    Period Weeks    Status New    Target Date 03/25/21      PT LONG TERM GOAL #3   Title Pt will decrease DHI score by at least 18 points in order to demonstrate clinically significant reduction in disability    Baseline 01/28/21: [place] to be obtained at next follow-up visit; 02/04/21: [place] 19    Time 8    Period Weeks    Status New    Target Date 03/25/21      PT LONG TERM GOAL #4   Title Patient will improve TUG to 14 seconds or less indicative of decreased risk of falls and improved ability to perform home-level mobility tasks safely    Baseline 01/28/21: TUG 30 seconds    Time 8    Period Weeks    Status New    Target Date 03/25/21                   Plan - 02/04/21 1108     Clinical Impression Statement Patient arrives with excellent motivation to participate in physical therapy. She is undergoing consultation for cervical myelopathy. Patient had no reproduction of symptoms with cervical screen at IE and has been instructed on utilizing pain-free and symptom-free ROM only and signs/symptoms that would necessitate stopping VOR work requiring small degree of head turning. She completed BERG, 5 times STS, and DHI today. BERG and 5TSTS further indicating fall risk and need of intervention. She exhibits good targeting during VOR x 1 performed in sitting today. Patient has remaining deficits in postural stability, occulomotor impairments, dizziness, and gait necessitating further PT. Patient will benefit from continued skilled therapeutic intervention to address the above deficits as needed for improved function and QoL.    Personal Factors and Comorbidities Age;Comorbidity 3+    Comorbidities chronic kidney disease, Type II DM, peripheral neuropathy, hypertension, Hx of A-fib    Examination-Activity Limitations Bed Mobility;Stairs;Stand;Locomotion Level;Transfers    Examination-Participation Restrictions Community  Activity;Cleaning    Stability/Clinical Decision Making Unstable/Unpredictable    Rehab Potential Good    PT Frequency 2x / week    PT Duration 8 weeks    PT Treatment/Interventions Gait training;Stair training;Functional mobility training;Therapeutic activities;Therapeutic exercise;Balance training;Neuromuscular re-education;Patient/family education    PT Next Visit Plan Continue with  occulomotor re-training, VOR training, LE strengthening and postural stabilty work    PT [place] and Agree with Plan of Care Patient             Patient will benefit from skilled therapeutic intervention in order to improve the following deficits and impairments:  Abnormal gait, Postural dysfunction, Difficulty walking, Decreased balance, Decreased coordination  Visit Diagnosis: Imbalance  Difficulty in walking, not elsewhere classified  Dizziness and giddiness     Problem List There are no problems to display for this patient.  Valentina Gu, PT, DPT [id number]  Eilleen Kempf 02/05/2021, 10:20 AM  [facility] [address] [place], [place], [address] Phone: [phone]   Fax:  [phone]  Name: Keela Pluth MRN: [id number] Date of Birth: [birth year]

## 2021-02-05 ENCOUNTER — Encounter: Payer: Self-pay | Admitting: Physical Therapy

## 2021-02-07 NOTE — Addendum Note (Signed)
Encounter addended by: Annie Paras on: 02/07/2021 12:09 PM  Actions taken: Letter saved

## 2021-02-09 ENCOUNTER — Ambulatory Visit: Payer: Medicare Other | Attending: Neurology | Admitting: Physical Therapy

## 2021-02-09 ENCOUNTER — Other Ambulatory Visit: Payer: Self-pay

## 2021-02-09 DIAGNOSIS — R262 Difficulty in walking, not elsewhere classified: Secondary | ICD-10-CM

## 2021-02-09 DIAGNOSIS — R42 Dizziness and giddiness: Secondary | ICD-10-CM | POA: Insufficient documentation

## 2021-02-09 DIAGNOSIS — R2689 Other abnormalities of gait and mobility: Secondary | ICD-10-CM | POA: Diagnosis present

## 2021-02-10 ENCOUNTER — Encounter: Payer: Self-pay | Admitting: Physical Therapy

## 2021-02-10 NOTE — Therapy (Signed)
[facility] [address] [place], [place], [address] Phone: [phone]   Fax:  [phone]  Physical Therapy Treatment  Patient Details  Name: Jean Johnson MRN: [id number] Date of Birth: 02-15-1951 Referring Provider (PT): Dr. Vladimir Crofts   Encounter Date: 02/09/2021   PT End of Session - 02/10/21 1642     Visit Number 3    Number of Visits 17    Date for PT Re-Evaluation 03/25/21    Authorization Time Period initial eval 01/28/21    Progress Note Due on Visit 10    PT Start Time 1000    PT Stop Time 1050    PT Time Calculation (min) 50 min    Equipment Utilized During Treatment Gait belt    Activity Tolerance Patient tolerated treatment well    Behavior During Therapy WFL for tasks assessed/performed             Past Medical History:  Diagnosis Date   Afib ([place])    history of    Diabetes mellitus without complication ([place])    Hypertension    Neuromuscular disorder ([place])    neuropathy    Past Surgical History:  Procedure Laterality Date   CATARACT EXTRACTION W/PHACO Left 10/21/2020   Procedure: CATARACT EXTRACTION PHACO AND INTRAOCULAR LENS PLACEMENT ([place]) DIABETES LEFT;  Surgeon: Leandrew Koyanagi, MD;  Location: [place];  Service: Ophthalmology;  Laterality: Left;  2.84 0:48.8 5.8%   KIDNEY TRANSPLANT     x 2     There were no vitals filed for this visit.   Subjective Assessment - 02/10/21 1640     Subjective Patient reports compliance with her initial HEP. She reports some "strain" in her eyes intermittently with gaze stability work. She reports no major issues after he last visit. Patient reports minimal symptoms at arrival to PT today.    Pertinent History Patient is a 70 year old female with primary complaint of dizziness and imbalance. She states this got worse around early 2021 (month unspecified). Patient reports feeling off balance. She reports intermittently feeling some sensation  of her head spinning.  She reports dizziness can last throughout the day. She states this is happening every day. Pt reports no change in sypmtoms since onset. She states it feels different than previous bout of vertigo. She states she uses her cane to move about the house. She reports some difficulty with direction of gait - "I'm trying to walk this way, but walk the other way." Patient has hx of chronic kidney disease, bilateral neuropathy affecting LEs and R hand. She has mild numbness affecting her L hand. She feels that Lyrica helps "a little." Patient reports no facial numbness or paresthesias. . Patient denies N&V with dizziness. She reports no notable dysarhtria, some dysphagia with food "not wanting to go down." Patient reports limitation in household cleaning, getting up stairs to access her bedroom (handrail is on left going up). She reports more difficulty with LLE going down steps. She reports using single-point cane for household mobility, walker for going outside. She uses wheelchair for shopping if scooter is unavailable. Patient reports one fall in last 6 months - falling out of chair. Patient reports no auditory complaints. Pt is s/p kidney transplant x 2 and she has prolapsed uterus; she reports some bloating and increased urgency, though this has not changed in the previous year notably. Pt denies syncope or drop attacks. Patient has been assessed for mild neck pain in association with neuropathic  pain and dizziness with Dr. Jennings Books - consult sent to neurosurgery with concern for possible myelopathy.    Limitations Walking;House hold activities;Standing    Diagnostic tests Impression from radiology as follows:    At C3-C4, there is grade 1 anterolisthesis. 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 T2 hyperintense signal abnormality within the spinal cord at this level, which may reflect focal edema and/or myelomalacia. Bilateral neural foraminal   narrowing  (moderate right, severe left).     At C5-C6, a posterior disc osteophyte complex contributes to severe   spinal canal stenosis (greater on the right) with spinal cord flattening. T2 hyperintense signal abnormality within the right   aspect of the spinal cord at this level compatible with myelomalacia and/or focal edema. Bilateral neural foraminal narrowing (severe   right, moderate/severe left).     At C7-T1, a posterior disc osteophyte complex contributes to mild/moderate spinal canal stenosis, contacting and minimally flattening the ventral spinal cord. Bilateral neural foraminal   narrowing (moderate/severe right, severe left).  No more than mild spinal canal stenosis at the remaining levels. Additional sites of neural foraminal narrowing (including additional   sites of severe neural foraminal narrowing), as detailed. Reversal of the expected cervical lordosis. Grade 1 anterolisthesis also present at C4-C5.    Patient Stated Goals Improvement in dizziness and ability to get around home              Strength R/L 4-/3+ Hip flexion 4/4 Hip abduction 5/5 Hip adduction 4+/4- Knee extension 4/4- Knee flexion 4+/4 Ankle Dorsiflexion 4/4 Ankle Plantarflexion *indicates pain     TREATMENT    Neuromuscular Re-education - for occulomotor re-training, VOR training, postural stability and balance re-training   VOR x 1 in sitting; 2x20; horizontal and vertical  Smooth pursuits with visual target manipulated by therapist; horiz and vertical, in sitting; 2x20  [place] eyes closed; 2x30sec  [place] eyes open on foam; 2x30sec   HEP update for smooth pursuits with visual target; see Access Code under Home Program    Therapeutic Activities - repetitive task practice and performance of functional ADLs to improve activity performance, gait training  *MMT assessment (see above)  Adjustment of single-point cane for appropriate height and gait with SPC x 1 lap in gym  In //  bars: Forward/retro stepping; x3 D/B Sidestepping; x3 D/B Standing heel raise/toe raise; 2x10 alternating      ASSESSMENT Patient arrives with excellent motivation to participate in physical therapy. Patient demonstrates normal [place] with eyes closed today. She demonstrates slow cadence with VOR work, but this is likely associated with comorbid cervical spine stiffness and comorbid cervical spine myelopathy (patient is undergoing consultation with neurosurgery for this). She demonstrates significant gait instability and has intermittent LOB during stepping without UE support. Patient has remaining deficits in postural stability, occulomotor impairments, dizziness, and gait necessitating further PT. Patient will benefit from continued skilled therapeutic intervention to address the above deficits as needed for improved function and QoL.       PT Short Term Goals - 02/04/21 1053       PT SHORT TERM GOAL #1   Title Pt will be independent with HEP in order to improve strength and balance in order to decrease fall risk and improve function at home and work.    Baseline 02/04/21: HEP initiated at second follow-up visit    Time 3    Period Weeks    Status New    Target Date 02/18/21  PT SHORT TERM GOAL #2   Title Patient will perform independent stand pivot transfer without loss of balance or cueing required from clinician as needed for safe home-level mobility ability to get to her appointments    Baseline 01/28/21: LOB and high guard with significant postural sway during transfer W/C to table    Time 4    Period Weeks    Status New    Target Date 02/25/21               PT Long Term Goals - 02/04/21 1054       PT LONG TERM GOAL #1   Title Patient will demonstrate improved function as evidenced by a score of 51 on FOTO measure for full participation in activities at home and in the community.    Baseline 01/28/21: FOTO 41    Time 8    Period Weeks    Status New    Target  Date 03/25/21      PT LONG TERM GOAL #2   Title Pt will improve BERG by at least 3 points in order to demonstrate clinically significant improvement in balance.    Baseline 01/28/21: BERG score to be obtained at next f/u visit; 02/04/21: BERG 38/56    Time 8    Period Weeks    Status New    Target Date 03/25/21      PT LONG TERM GOAL #3   Title Pt will decrease DHI score by at least 18 points in order to demonstrate clinically significant reduction in disability    Baseline 01/28/21: [place] to be obtained at next follow-up visit; 02/04/21: [employer] 19    Time 8    Period Weeks    Status New    Target Date 03/25/21      PT LONG TERM GOAL #4   Title Patient will improve TUG to 14 seconds or less indicative of decreased risk of falls and improved ability to perform home-level mobility tasks safely    Baseline 01/28/21: TUG 30 seconds    Time 8    Period Weeks    Status New    Target Date 03/25/21                   Plan - 02/10/21 1650     Clinical Impression Statement Patient arrives with excellent motivation to participate in physical therapy. Patient demonstrates normal [place] with eyes closed today. She demonstrates slow cadence with VOR work, but this is likely associated with comorbid cervical spine stiffness and comorbid cervical spine myelopathy (patient is undergoing consultation with neurosurgery for this). She demonstrates significant gait instability and has intermittent LOB during stepping without UE support. Patient has remaining deficits in postural stability, occulomotor impairments, dizziness, and gait necessitating further PT. Patient will benefit from continued skilled therapeutic intervention to address the above deficits as needed for improved function and QoL.    Personal Factors and Comorbidities Age;Comorbidity 3+    Comorbidities chronic kidney disease, Type II DM, peripheral neuropathy, hypertension, Hx of A-fib    Examination-Activity Limitations Bed  Mobility;Stairs;Stand;Locomotion Level;Transfers    Examination-Participation Restrictions Community Activity;Cleaning    Stability/Clinical Decision Making Unstable/Unpredictable    Rehab Potential Good    PT Frequency 2x / week    PT Duration 8 weeks    PT Treatment/Interventions Gait training;Stair training;Functional mobility training;Therapeutic activities;Therapeutic exercise;Balance training;Neuromuscular re-education;Patient/family education    PT Next Visit Plan Continue with occulomotor re-training, VOR training, LE strengthening and postural stabilty work  PT Home Exercise Plan Access Code [email or web address]    Consulted and Agree with Plan of Care Patient             Patient will benefit from skilled therapeutic intervention in order to improve the following deficits and impairments:  Abnormal gait, Postural dysfunction, Difficulty walking, Decreased balance, Decreased coordination  Visit Diagnosis: Imbalance  Difficulty in walking, not elsewhere classified  Dizziness and giddiness     Problem List There are no problems to display for this patient.  Valentina Gu, PT, DPT [id number]  Eilleen Kempf 02/10/2021, 4:52 PM  [facility] [address] [place], [place], [address] Phone: [phone]   Fax:  [phone]  Name: Jean Johnson MRN: [id number] Date of Birth: 09/03/1950

## 2021-02-11 ENCOUNTER — Encounter: Payer: 59 | Admitting: Physical Therapy

## 2021-02-16 ENCOUNTER — Ambulatory Visit: Payer: Medicare Other | Admitting: Physical Therapy

## 2021-02-17 ENCOUNTER — Ambulatory Visit: Payer: Medicare Other | Admitting: Physical Therapy

## 2021-02-17 ENCOUNTER — Other Ambulatory Visit: Payer: Self-pay

## 2021-02-17 ENCOUNTER — Encounter: Payer: Self-pay | Admitting: Physical Therapy

## 2021-02-17 DIAGNOSIS — R2689 Other abnormalities of gait and mobility: Secondary | ICD-10-CM | POA: Diagnosis not present

## 2021-02-17 DIAGNOSIS — R262 Difficulty in walking, not elsewhere classified: Secondary | ICD-10-CM

## 2021-02-17 DIAGNOSIS — R42 Dizziness and giddiness: Secondary | ICD-10-CM

## 2021-02-17 NOTE — Therapy (Signed)
[place] [facility] [address] [place], [place], [address] Phone: [phone]   Fax:  [phone]  Physical Therapy Treatment  Patient Details  Name: Jean Johnson MRN: [id number] Date of Birth: [birth year] Referring Provider (PT): Dr. Vladimir Crofts   Encounter Date: 02/17/2021   PT End of Session - 02/17/21 1510     Visit Number 4    Number of Visits 17    Date for PT Re-Evaluation 03/25/21    Authorization Time Period initial eval 01/28/21    Progress Note Due on Visit 10    PT Start Time 1327    PT Stop Time 1413    PT Time Calculation (min) 46 min    Equipment Utilized During Treatment Gait belt    Activity Tolerance Patient tolerated treatment well    Behavior During Therapy WFL for tasks assessed/performed             Past Medical History:  Diagnosis Date   Afib ([place])    history of    Diabetes mellitus without complication ([place])    Hypertension    Neuromuscular disorder ([place])    neuropathy    Past Surgical History:  Procedure Laterality Date   CATARACT EXTRACTION W/PHACO Left 10/21/2020   Procedure: CATARACT EXTRACTION PHACO AND INTRAOCULAR LENS PLACEMENT ([place]) DIABETES LEFT;  Surgeon: Leandrew Koyanagi, MD;  Location: [place];  Service: Ophthalmology;  Laterality: Left;  2.84 0:48.8 5.8%   KIDNEY TRANSPLANT     x 2     There were no vitals filed for this visit.   Subjective Assessment - 02/17/21 1331     Subjective Patient reports feeling mild dizziness, feeling of "eyes spinning" at arrival to PT. Patient reports that she does use her SPC in the house. She uses W/C for getting out to community. 5/10 dizziness at arrival to PT. Pt denies nausea at this time. Patient denies feeling of head spinning, "very little" feeling of room spinning at this time. Patient reports compliance with her HEP.    Pertinent History Patient is a 70 year old female with primary complaint of dizziness and imbalance.  She states this got worse around early 2021 (month unspecified). Patient reports feeling off balance. She reports intermittently feeling some sensation of her head spinning.  She reports dizziness can last throughout the day. She states this is happening every day. Pt reports no change in sypmtoms since onset. She states it feels different than previous bout of vertigo. She states she uses her cane to move about the house. She reports some difficulty with direction of gait - "I'm trying to walk this way, but walk the other way." Patient has hx of chronic kidney disease, bilateral neuropathy affecting LEs and R hand. She has mild numbness affecting her L hand. She feels that Lyrica helps "a little." Patient reports no facial numbness or paresthesias. . Patient denies N&V with dizziness. She reports no notable dysarhtria, some dysphagia with food "not wanting to go down." Patient reports limitation in household cleaning, getting up stairs to access her bedroom (handrail is on left going up). She reports more difficulty with LLE going down steps. She reports using single-point cane for household mobility, walker for going outside. She uses wheelchair for shopping if scooter is unavailable. Patient reports one fall in last 6 months - falling out of chair. Patient reports no auditory complaints. Pt is s/p kidney transplant x 2 and she has prolapsed uterus; she reports some bloating and increased urgency,  though this has not changed in the previous year notably. Pt denies syncope or drop attacks. Patient has been assessed for mild neck pain in association with neuropathic pain and dizziness with Dr. Jennings Books - consult sent to neurosurgery with concern for possible myelopathy.    Limitations Walking;House hold activities;Standing    Diagnostic tests Impression from radiology as follows:    At C3-C4, there is grade 1 anterolisthesis. 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 T2  hyperintense signal abnormality within the spinal cord at this level, which may reflect focal edema and/or myelomalacia. Bilateral neural foraminal   narrowing (moderate right, severe left).     At C5-C6, a posterior disc osteophyte complex contributes to severe   spinal canal stenosis (greater on the right) with spinal cord flattening. T2 hyperintense signal abnormality within the right   aspect of the spinal cord at this level compatible with myelomalacia and/or focal edema. Bilateral neural foraminal narrowing (severe   right, moderate/severe left).     At C7-T1, a posterior disc osteophyte complex contributes to mild/moderate spinal canal stenosis, contacting and minimally flattening the ventral spinal cord. Bilateral neural foraminal   narrowing (moderate/severe right, severe left).  No more than mild spinal canal stenosis at the remaining levels. Additional sites of neural foraminal narrowing (including additional   sites of severe neural foraminal narrowing), as detailed. Reversal of the expected cervical lordosis. Grade 1 anterolisthesis also present at C4-C5.    Patient Stated Goals Improvement in dizziness and ability to get around home                  TREATMENT      Neuromuscular Re-education - for occulomotor re-training, VOR training, postural stability and balance re-training   VOR x 1 in standing; 2x20; horizontal and vertical, standby assist with patient adjacent to table  Semitandem eyes closed attempted, unable to maintain postural stability with frequent lean to L   Rhomberg eyes closed; 1x30sec   Rhomberg eyes open on foam; 2x30sec   *next visit* Toe Tap; to [employer]; 2x10 alternating (in // bars)   *not today* Smooth pursuits with visual target manipulated by therapist; horiz and vertical, in sitting; 2x20    Therapeutic Activities - repetitive task practice and performance of functional ADLs to improve activity performance, gait training    In //  bars: Forward/retro stepping; x2 D/B Sidestepping; x3 D/B Standing heel raise/toe raise; 2x10 alternating   *not today* Adjustment of single-point cane for appropriate height and gait with SPC x 1 lap in gym     ASSESSMENT Patient arrives with excellent motivation to participate in physical therapy. She has ongoing dizziness consistent with central etiology versus peripheral vestibular dysfunction. Patient reports improvement in her symptoms, albeit dizziness is still present. She does still have notable gait instability with forward, backward, and side stepping. She is significantly challenged with static postural stability tasks performed today including standing with eyes closed with narrow BOS. Patient has remaining deficits in postural stability, occulomotor impairments, dizziness, and gait necessitating further PT. Patient will benefit from continued skilled therapeutic intervention to address the above deficits as needed for improved function and QoL.       PT Short Term Goals - 02/04/21 1053       PT SHORT TERM GOAL #1   Title Pt will be independent with HEP in order to improve strength and balance in order to decrease fall risk and improve function at home and work.    Baseline 02/04/21:  HEP initiated at second follow-up visit    Time 3    Period Weeks    Status New    Target Date 02/18/21      PT SHORT TERM GOAL #2   Title Patient will perform independent stand pivot transfer without loss of balance or cueing required from clinician as needed for safe home-level mobility ability to get to her appointments    Baseline 01/28/21: LOB and high guard with significant postural sway during transfer W/C to table    Time 4    Period Weeks    Status New    Target Date 02/25/21               PT Long Term Goals - 02/04/21 1054       PT LONG TERM GOAL #1   Title Patient will demonstrate improved function as evidenced by a score of 51 on FOTO measure for full participation in  activities at home and in the community.    Baseline 01/28/21: FOTO 41    Time 8    Period Weeks    Status New    Target Date 03/25/21      PT LONG TERM GOAL #2   Title Pt will improve BERG by at least 3 points in order to demonstrate clinically significant improvement in balance.    Baseline 01/28/21: BERG score to be obtained at next f/u visit; 02/04/21: BERG 38/56    Time 8    Period Weeks    Status New    Target Date 03/25/21      PT LONG TERM GOAL #3   Title Pt will decrease DHI score by at least 18 points in order to demonstrate clinically significant reduction in disability    Baseline 01/28/21: [place] to be obtained at next follow-up visit; 02/04/21: [place] 19    Time 8    Period Weeks    Status New    Target Date 03/25/21      PT LONG TERM GOAL #4   Title Patient will improve TUG to 14 seconds or less indicative of decreased risk of falls and improved ability to perform home-level mobility tasks safely    Baseline 01/28/21: TUG 30 seconds    Time 8    Period Weeks    Status New    Target Date 03/25/21                   Plan - 02/17/21 1559     Clinical Impression Statement Patient arrives with excellent motivation to participate in physical therapy. She has ongoing dizziness consistent with central etiology versus peripheral vestibular dysfunction. Patient reports improvement in her symptoms, albeit dizziness is still present. She does still have notable gait instability with forward, backward, and side stepping. She is significantly challenged with static postural stability tasks performed today including standing with eyes closed with narrow BOS. Patient has remaining deficits in postural stability, occulomotor impairments, dizziness, and gait necessitating further PT. Patient will benefit from continued skilled therapeutic intervention to address the above deficits as needed for improved function and QoL.    Personal Factors and Comorbidities Age;Comorbidity 3+     Comorbidities chronic kidney disease, Type II DM, peripheral neuropathy, hypertension, Hx of A-fib    Examination-Activity Limitations Bed Mobility;Stairs;Stand;Locomotion Level;Transfers    Examination-Participation Restrictions Community Activity;Cleaning    Stability/Clinical Decision Making Unstable/Unpredictable    Rehab Potential Good    PT Frequency 2x / week    PT Duration 8 weeks  PT Treatment/Interventions Gait training;Stair training;Functional mobility training;Therapeutic activities;Therapeutic exercise;Balance training;Neuromuscular re-education;Patient/family education    PT Next Visit Plan Continue with occulomotor re-training, VOR training, LE strengthening and postural stabilty work    PT [place] and Agree with Plan of Care Patient             Patient will benefit from skilled therapeutic intervention in order to improve the following deficits and impairments:  Abnormal gait, Postural dysfunction, Difficulty walking, Decreased balance, Decreased coordination  Visit Diagnosis: Imbalance  Difficulty in walking, not elsewhere classified  Dizziness and giddiness     Problem List There are no problems to display for this patient.  Valentina Gu, PT, DPT [id number]  Eilleen Kempf 02/17/2021, 4:00 PM  [facility] [facility] [address] [place], [place], [address] Phone: [phone]   Fax:  [phone]  Name: Willie Mehan MRN: [id number] Date of Birth: 09/24/1950

## 2021-02-18 ENCOUNTER — Encounter: Payer: 59 | Admitting: Physical Therapy

## 2021-02-23 ENCOUNTER — Other Ambulatory Visit: Payer: Self-pay

## 2021-02-23 ENCOUNTER — Ambulatory Visit: Payer: Medicare Other | Admitting: Physical Therapy

## 2021-02-23 DIAGNOSIS — R262 Difficulty in walking, not elsewhere classified: Secondary | ICD-10-CM

## 2021-02-23 DIAGNOSIS — R2689 Other abnormalities of gait and mobility: Secondary | ICD-10-CM

## 2021-02-23 DIAGNOSIS — R42 Dizziness and giddiness: Secondary | ICD-10-CM

## 2021-02-23 NOTE — Therapy (Signed)
[facility] [address] [place], [place], [address] Phone: [phone]   Fax:  [phone]  Physical Therapy Treatment  Patient Details  Name: Jean Johnson MRN: [id number] Date of Birth: 06-Oct-1950 Referring Provider (PT): Dr. Vladimir Crofts   Encounter Date: 02/23/2021   PT End of Session - 02/24/21 0743     Visit Number 5    Number of Visits 17    Date for PT Re-Evaluation 03/23/21    Authorization Time Period initial eval 01/28/21    Progress Note Due on Visit 10    PT Start Time 0948    PT Stop Time 1038    PT Time Calculation (min) 50 min    Equipment Utilized During Treatment Gait belt    Activity Tolerance Patient tolerated treatment well    Behavior During Therapy WFL for tasks assessed/performed             Past Medical History:  Diagnosis Date   Afib ([place])    history of    Diabetes mellitus without complication ([place])    Hypertension    Neuromuscular disorder ([place])    neuropathy    Past Surgical History:  Procedure Laterality Date   CATARACT EXTRACTION W/PHACO Left 10/21/2020   Procedure: CATARACT EXTRACTION PHACO AND INTRAOCULAR LENS PLACEMENT ([place]) DIABETES LEFT;  Surgeon: Leandrew Koyanagi, MD;  Location: [place];  Service: Ophthalmology;  Laterality: Left;  2.84 0:48.8 5.8%   KIDNEY TRANSPLANT     x 2     There were no vitals filed for this visit.   Subjective Assessment - 02/23/21 0952     Subjective She reports still experiencing some slight dizziness at this time. Patient has been scheduled for upcoming ACDF due to cervical myelopathy - scheduled for 03/24/21. Patient reports having one episode of LOB due to L-sided weakness. Patient reports with imbalance, she tends to fall toward her L side. She reports compliance with her HEP. 3/10 dizziness at arrival.    Pertinent History Patient is a 70 year old female with primary complaint of dizziness and imbalance. She states this got  worse around early 2021 (month unspecified). Patient reports feeling off balance. She reports intermittently feeling some sensation of her head spinning.  She reports dizziness can last throughout the day. She states this is happening every day. Pt reports no change in sypmtoms since onset. She states it feels different than previous bout of vertigo. She states she uses her cane to move about the house. She reports some difficulty with direction of gait - "I'm trying to walk this way, but walk the other way." Patient has hx of chronic kidney disease, bilateral neuropathy affecting LEs and R hand. She has mild numbness affecting her L hand. She feels that Lyrica helps "a little." Patient reports no facial numbness or paresthesias. . Patient denies N&V with dizziness. She reports no notable dysarhtria, some dysphagia with food "not wanting to go down." Patient reports limitation in household cleaning, getting up stairs to access her bedroom (handrail is on left going up). She reports more difficulty with LLE going down steps. She reports using single-point cane for household mobility, walker for going outside. She uses wheelchair for shopping if scooter is unavailable. Patient reports one fall in last 6 months - falling out of chair. Patient reports no auditory complaints. Pt is s/p kidney transplant x 2 and she has prolapsed uterus; she reports some bloating and increased urgency, though this has not changed in the  previous year notably. Pt denies syncope or drop attacks. Patient has been assessed for mild neck pain in association with neuropathic pain and dizziness with Dr. Jennings Books - consult sent to neurosurgery with concern for possible myelopathy.    Limitations Walking;House hold activities;Standing    Diagnostic tests Impression from radiology as follows:    At C3-C4, there is grade 1 anterolisthesis. 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 T2 hyperintense signal  abnormality within the spinal cord at this level, which may reflect focal edema and/or myelomalacia. Bilateral neural foraminal   narrowing (moderate right, severe left).     At C5-C6, a posterior disc osteophyte complex contributes to severe   spinal canal stenosis (greater on the right) with spinal cord flattening. T2 hyperintense signal abnormality within the right   aspect of the spinal cord at this level compatible with myelomalacia and/or focal edema. Bilateral neural foraminal narrowing (severe   right, moderate/severe left).     At C7-T1, a posterior disc osteophyte complex contributes to mild/moderate spinal canal stenosis, contacting and minimally flattening the ventral spinal cord. Bilateral neural foraminal   narrowing (moderate/severe right, severe left).  No more than mild spinal canal stenosis at the remaining levels. Additional sites of neural foraminal narrowing (including additional   sites of severe neural foraminal narrowing), as detailed. Reversal of the expected cervical lordosis. Grade 1 anterolisthesis also present at C4-C5.    Patient Stated Goals Improvement in dizziness and ability to get around home               TREATMENT      Neuromuscular Re-education - for occulomotor re-training, VOR training, postural stability and balance re-training   VOR x 1 in standing; 1x30; horizontal and vertical, standby assist with patient adjacent to table   Quarter Tandem, eyes closed; 2 x 10 sec, bilateral   Rhomberg eyes closed; 1x30sec    Standing with eyes closed feet apart, x 1 min   Toe Tap; to 6-inch step; 2x10 alternating (at base of stairs)     *not today* Rhomberg eyes open on foam; 2x30sec Semitandem eyes closed attempted, unable to maintain postural stability with frequent lean to L Smooth pursuits with visual target manipulated by therapist; horiz and vertical, in sitting; 2x20     Therapeutic Activities - repetitive task practice and performance of functional  ADLs to improve activity performance, gait training     In // bars: Forward/retro stepping; x3 D/B Sidestepping; x3 D/B Standing heel raise/toe raise; 2x10 alternating     *not today* Adjustment of single-point cane for appropriate height and gait with SPC x 1 lap in gym     ASSESSMENT Patient has mild remaining dizziness at this time and L>R-sided weakness. Her condition is largely affected by cervical myelopathy, for which patient will be having surgery in one month. Static postural stability training needs to be regressed today due to difficulty maintaining balance with narrow BOS. She tolerates VOR drills well without significant increase in dizziness. Patient has remaining deficits in postural stability, occulomotor impairments, dizziness, and gait necessitating further PT. Patient will benefit from continued skilled therapeutic intervention to address the above deficits as needed for improved function and QoL.       PT Short Term Goals - 02/04/21 1053       PT SHORT TERM GOAL #1   Title Pt will be independent with HEP in order to improve strength and balance in order to decrease fall risk and improve function at home  and work.    Baseline 02/04/21: HEP initiated at second follow-up visit    Time 3    Period Weeks    Status New    Target Date 02/18/21      PT SHORT TERM GOAL #2   Title Patient will perform independent stand pivot transfer without loss of balance or cueing required from clinician as needed for safe home-level mobility ability to get to her appointments    Baseline 01/28/21: LOB and high guard with significant postural sway during transfer W/C to table    Time 4    Period Weeks    Status New    Target Date 02/25/21               PT Long Term Goals - 02/04/21 1054       PT LONG TERM GOAL #1   Title Patient will demonstrate improved function as evidenced by a score of 51 on FOTO measure for full participation in activities at home and in the community.     Baseline 01/28/21: FOTO 41    Time 8    Period Weeks    Status New    Target Date 03/25/21      PT LONG TERM GOAL #2   Title Pt will improve BERG by at least 3 points in order to demonstrate clinically significant improvement in balance.    Baseline 01/28/21: BERG score to be obtained at next f/u visit; 02/04/21: BERG 38/56    Time 8    Period Weeks    Status New    Target Date 03/25/21      PT LONG TERM GOAL #3   Title Pt will decrease DHI score by at least 18 points in order to demonstrate clinically significant reduction in disability    Baseline 01/28/21: [place] to be obtained at next follow-up visit; 02/04/21: [place] 19    Time 8    Period Weeks    Status New    Target Date 03/25/21      PT LONG TERM GOAL #4   Title Patient will improve TUG to 14 seconds or less indicative of decreased risk of falls and improved ability to perform home-level mobility tasks safely    Baseline 01/28/21: TUG 30 seconds    Time 8    Period Weeks    Status New    Target Date 03/25/21                   Plan - 02/24/21 0747     Clinical Impression Statement Patient has mild remaining dizziness at this time and L>R-sided weakness. Her condition is largely affected by cervical myelopathy, for which patient will be having surgery in one month. Static postural stability training needs to be regressed today due to difficulty maintaining balance with narrow BOS. She tolerates VOR drills well without significant increase in dizziness. Patient has remaining deficits in postural stability, occulomotor impairments, dizziness, and gait necessitating further PT. Patient will benefit from continued skilled therapeutic intervention to address the above deficits as needed for improved function and QoL.    Personal Factors and Comorbidities Age;Comorbidity 3+    Comorbidities chronic kidney disease, Type II DM, peripheral neuropathy, hypertension, Hx of A-fib    Examination-Activity Limitations Bed  Mobility;Stairs;Stand;Locomotion Level;Transfers    Examination-Participation Restrictions Community Activity;Cleaning    Stability/Clinical Decision Making Unstable/Unpredictable    Rehab Potential Good    PT Frequency 2x / week    PT Duration 8 weeks  PT Treatment/Interventions Gait training;Stair training;Functional mobility training;Therapeutic activities;Therapeutic exercise;Balance training;Neuromuscular re-education;Patient/family education    PT Next Visit Plan Continue with occulomotor re-training, VOR training, LE strengthening and postural stabilty work. Upcoming ACDF scheduled for 03/24/21.    PT Home Exercise Plan Access Code [email or web address]    Consulted and Agree with Plan of Care Patient             Patient will benefit from skilled therapeutic intervention in order to improve the following deficits and impairments:  Abnormal gait, Postural dysfunction, Difficulty walking, Decreased balance, Decreased coordination  Visit Diagnosis: Imbalance  Difficulty in walking, not elsewhere classified  Dizziness and giddiness     Problem List There are no problems to display for this patient.  Valentina Gu, PT, DPT [id number]  Eilleen Kempf 02/24/2021, 7:51 AM  [facility] [address] [place], [place], [address] Phone: [phone]   Fax:  [phone]  Name: Jean Johnson MRN: [id number] Date of Birth: Aug 10, 1950

## 2021-02-24 ENCOUNTER — Other Ambulatory Visit: Payer: Self-pay | Admitting: Neurosurgery

## 2021-02-24 ENCOUNTER — Encounter: Payer: Self-pay | Admitting: Physical Therapy

## 2021-02-25 ENCOUNTER — Ambulatory Visit: Payer: Medicare Other | Admitting: Physical Therapy

## 2021-02-25 ENCOUNTER — Other Ambulatory Visit: Payer: Self-pay

## 2021-02-25 DIAGNOSIS — R262 Difficulty in walking, not elsewhere classified: Secondary | ICD-10-CM

## 2021-02-25 DIAGNOSIS — R42 Dizziness and giddiness: Secondary | ICD-10-CM

## 2021-02-25 DIAGNOSIS — R2689 Other abnormalities of gait and mobility: Secondary | ICD-10-CM

## 2021-02-25 NOTE — Therapy (Signed)
[facility] [address] [place], [place], [address] Phone: [phone]   Fax:  [phone]  Physical Therapy Treatment  Patient Details  Name: Jean Johnson MRN: [id number] Date of Birth: Dec 11, 1950 Referring Provider (PT): Dr. Vladimir Crofts   Encounter Date: 02/25/2021   PT End of Session - 02/25/21 1047     Visit Number 6    Number of Visits 17    Date for PT Re-Evaluation 03/23/21    Authorization Time Period initial eval 01/28/21    Progress Note Due on Visit 10    PT Start Time 0943    PT Stop Time 1029    PT Time Calculation (min) 46 min    Equipment Utilized During Treatment Gait belt    Activity Tolerance Patient tolerated treatment well    Behavior During Therapy WFL for tasks assessed/performed             Past Medical History:  Diagnosis Date   Afib ([place])    history of    Diabetes mellitus without complication ([place])    Hypertension    Neuromuscular disorder ([place])    neuropathy    Past Surgical History:  Procedure Laterality Date   CATARACT EXTRACTION W/PHACO Left 10/21/2020   Procedure: CATARACT EXTRACTION PHACO AND INTRAOCULAR LENS PLACEMENT ([place]) DIABETES LEFT;  Surgeon: Leandrew Koyanagi, MD;  Location: [place];  Service: Ophthalmology;  Laterality: Left;  2.84 0:48.8 5.8%   KIDNEY TRANSPLANT     x 2     There were no vitals filed for this visit.   Subjective Assessment - 02/25/21 1034     Subjective Pt reports 2/10 dizziness at arrival to PT. She is undergoing testing for stenosis of unspecified cervical artery today. Pt has upcoming surgery (ACDF) to address cervical myelopathy. She reports compliance with her established home program and completing stepping drils in home with adjacent wall/counter.    Pertinent History Patient is a 70 year old female with primary complaint of dizziness and imbalance. She states this got worse around early 2021 (month unspecified). Patient  reports feeling off balance. She reports intermittently feeling some sensation of her head spinning.  She reports dizziness can last throughout the day. She states this is happening every day. Pt reports no change in sypmtoms since onset. She states it feels different than previous bout of vertigo. She states she uses her cane to move about the house. She reports some difficulty with direction of gait - "I'm trying to walk this way, but walk the other way." Patient has hx of chronic kidney disease, bilateral neuropathy affecting LEs and R hand. She has mild numbness affecting her L hand. She feels that Lyrica helps "a little." Patient reports no facial numbness or paresthesias. . Patient denies N&V with dizziness. She reports no notable dysarhtria, some dysphagia with food "not wanting to go down." Patient reports limitation in household cleaning, getting up stairs to access her bedroom (handrail is on left going up). She reports more difficulty with LLE going down steps. She reports using single-point cane for household mobility, walker for going outside. She uses wheelchair for shopping if scooter is unavailable. Patient reports one fall in last 6 months - falling out of chair. Patient reports no auditory complaints. Pt is s/p kidney transplant x 2 and she has prolapsed uterus; she reports some bloating and increased urgency, though this has not changed in the previous year notably. Pt denies syncope or drop attacks. Patient has been assessed  for mild neck pain in association with neuropathic pain and dizziness with Dr. Jennings Books - consult sent to neurosurgery with concern for possible myelopathy.    Limitations Walking;House hold activities;Standing    Diagnostic tests Impression from radiology as follows:    At C3-C4, there is grade 1 anterolisthesis. 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 T2 hyperintense signal abnormality within the spinal cord at this level, which may  reflect focal edema and/or myelomalacia. Bilateral neural foraminal   narrowing (moderate right, severe left).     At C5-C6, a posterior disc osteophyte complex contributes to severe   spinal canal stenosis (greater on the right) with spinal cord flattening. T2 hyperintense signal abnormality within the right   aspect of the spinal cord at this level compatible with myelomalacia and/or focal edema. Bilateral neural foraminal narrowing (severe   right, moderate/severe left).     At C7-T1, a posterior disc osteophyte complex contributes to mild/moderate spinal canal stenosis, contacting and minimally flattening the ventral spinal cord. Bilateral neural foraminal   narrowing (moderate/severe right, severe left).  No more than mild spinal canal stenosis at the remaining levels. Additional sites of neural foraminal narrowing (including additional   sites of severe neural foraminal narrowing), as detailed. Reversal of the expected cervical lordosis. Grade 1 anterolisthesis also present at C4-C5.    Patient Stated Goals Improvement in dizziness and ability to get around home                 TREATMENT      Neuromuscular Re-education - for occulomotor re-training, VOR training, postural stability and balance re-training   VOR x 1 in standing; 1x30; horizontal and vertical, standby assist with patient adjacent to table   Standing with eyes closed, feet apart, on Airex; 3x20sec   In // bars: Dynamic march; 2x down/back Toe Tap; to 6-inch step; 2x10 alternating     *not today* Quarter Tandem, eyes closed; 2 x 10 sec, bilateral Rhomberg eyes closed; 1x30sec  Rhomberg eyes open on foam; 2x30sec Semitandem eyes closed attempted, unable to maintain postural stability with frequent lean to L Smooth pursuits with visual target manipulated by therapist; horiz and vertical, in sitting; 2x20     Therapeutic Activities - repetitive task practice and performance of functional ADLs to improve activity  performance, gait training   Sit to stand, from low table; 2x5   In // bars: Forward/retro stepping; x4 D/B with cueing for symmetrical toe-out (avoiding excessive R toe-out) and no cross-over gait Sidestepping; x3 D/B      *not today* Standing heel raise/toe raise; 2x10 alternating Adjustment of single-point cane for appropriate height and gait with SPC x 1 lap in gym     ASSESSMENT Patient has no increase in dizziness with sensory integration drills, VOR drills, or dynamic stepping drills. She does exhibit significant cross-over of RLE during gait with excessive R foot toe-out. Practiced correction of this pattern in // bars and worked on dynamic weight-shifting to improve postural stability, LE weakness, and equilibrium coordination (which are likely root cause of gait changes). Patient has remaining deficits in postural stability, occulomotor impairments, dizziness, and gait necessitating further PT. Patient will benefit from continued skilled therapeutic intervention to address the above deficits as needed for improved function and QoL.         PT Short Term Goals - 02/04/21 1053       PT SHORT TERM GOAL #1   Title Pt will be independent with HEP in order to  improve strength and balance in order to decrease fall risk and improve function at home and work.    Baseline 02/04/21: HEP initiated at second follow-up visit    Time 3    Period Weeks    Status New    Target Date 02/18/21      PT SHORT TERM GOAL #2   Title Patient will perform independent stand pivot transfer without loss of balance or cueing required from clinician as needed for safe home-level mobility ability to get to her appointments    Baseline 01/28/21: LOB and high guard with significant postural sway during transfer W/C to table    Time 4    Period Weeks    Status New    Target Date 02/25/21               PT Long Term Goals - 02/04/21 1054       PT LONG TERM GOAL #1   Title Patient will  demonstrate improved function as evidenced by a score of 51 on FOTO measure for full participation in activities at home and in the community.    Baseline 01/28/21: FOTO 41    Time 8    Period Weeks    Status New    Target Date 03/25/21      PT LONG TERM GOAL #2   Title Pt will improve BERG by at least 3 points in order to demonstrate clinically significant improvement in balance.    Baseline 01/28/21: BERG score to be obtained at next f/u visit; 02/04/21: BERG 38/56    Time 8    Period Weeks    Status New    Target Date 03/25/21      PT LONG TERM GOAL #3   Title Pt will decrease DHI score by at least 18 points in order to demonstrate clinically significant reduction in disability    Baseline 01/28/21: [place] to be obtained at next follow-up visit; 02/04/21: [place] 19    Time 8    Period Weeks    Status New    Target Date 03/25/21      PT LONG TERM GOAL #4   Title Patient will improve TUG to 14 seconds or less indicative of decreased risk of falls and improved ability to perform home-level mobility tasks safely    Baseline 01/28/21: TUG 30 seconds    Time 8    Period Weeks    Status New    Target Date 03/25/21                   Plan - 02/25/21 1056     Clinical Impression Statement Patient has no increase in dizziness with sensory integration drills, VOR drills, or dynamic stepping drills. She does exhibit significant cross-over of RLE during gait with excessive R foot toe-out. Practiced correction of this pattern in // bars and worked on dynamic weight-shifting to improve postural stability, LE weakness, and equilibrium coordination (which are likely root cause of gait changes). Patient has remaining deficits in postural stability, occulomotor impairments, dizziness, and gait necessitating further PT. Patient will benefit from continued skilled therapeutic intervention to address the above deficits as needed for improved function and QoL.    Personal Factors and Comorbidities  Age;Comorbidity 3+    Comorbidities chronic kidney disease, Type II DM, peripheral neuropathy, hypertension, Hx of A-fib    Examination-Activity Limitations Bed Mobility;Stairs;Stand;Locomotion Level;Transfers    Examination-Participation Restrictions Community Activity;Cleaning    Stability/Clinical Decision Making Unstable/Unpredictable    Rehab Potential  Good    PT Frequency 2x / week    PT Duration 8 weeks    PT Treatment/Interventions Gait training;Stair training;Functional mobility training;Therapeutic activities;Therapeutic exercise;Balance training;Neuromuscular re-education;Patient/family education    PT Next Visit Plan Continue with occulomotor re-training, VOR training, LE strengthening and postural stabilty work. Upcoming ACDF scheduled for 03/24/21.    PT Home Exercise Plan Access Code [email or web address]    Consulted and Agree with Plan of Care Patient             Patient will benefit from skilled therapeutic intervention in order to improve the following deficits and impairments:  Abnormal gait, Postural dysfunction, Difficulty walking, Decreased balance, Decreased coordination  Visit Diagnosis: Imbalance  Difficulty in walking, not elsewhere classified  Dizziness and giddiness     Problem List There are no problems to display for this patient.  Valentina Gu, PT, DPT [id number]  Eilleen Kempf 02/25/2021, 10:56 AM  [facility] [address] [place], [place], [address] Phone: [phone]   Fax:  [phone]  Name: Jean Johnson MRN: [id number] Date of Birth: 14-May-1951

## 2021-03-02 ENCOUNTER — Ambulatory Visit: Payer: Medicare Other | Admitting: Physical Therapy

## 2021-03-02 ENCOUNTER — Other Ambulatory Visit: Payer: Self-pay

## 2021-03-02 DIAGNOSIS — R2689 Other abnormalities of gait and mobility: Secondary | ICD-10-CM

## 2021-03-02 DIAGNOSIS — R42 Dizziness and giddiness: Secondary | ICD-10-CM

## 2021-03-02 DIAGNOSIS — R262 Difficulty in walking, not elsewhere classified: Secondary | ICD-10-CM

## 2021-03-02 NOTE — Therapy (Signed)
[facility] [address]. [place], [place], [address] Phone: [phone]   Fax:  [phone]  Physical Therapy Treatment/ Physical Therapy Progress Note   Dates of reporting period  01/28/21   to   03/02/21   Patient Details  Name: Jean Johnson MRN: [id number] Date of Birth: 08-Apr-1951 Referring Provider (PT): Dr. Vladimir Crofts   Encounter Date: 03/02/2021   PT End of Session - 03/02/21 0953     Visit Number 7    Number of Visits 17    Date for PT Re-Evaluation 03/23/21    Authorization Time Period initial eval 01/28/21    Progress Note Due on Visit 10    PT Start Time 0945    PT Stop Time 1032    PT Time Calculation (min) 47 min    Equipment Utilized During Treatment Gait belt    Activity Tolerance Patient tolerated treatment well    Behavior During Therapy [facility] for tasks assessed/performed             Past Medical History:  Diagnosis Date   Afib ([place])    history of    Diabetes mellitus without complication ([place])    Hypertension    Neuromuscular disorder ([place])    neuropathy    Past Surgical History:  Procedure Laterality Date   CATARACT EXTRACTION W/PHACO Left 10/21/2020   Procedure: CATARACT EXTRACTION PHACO AND INTRAOCULAR LENS PLACEMENT ([place]) DIABETES LEFT;  Surgeon: Leandrew Koyanagi, MD;  Location: [place];  Service: Ophthalmology;  Laterality: Left;  2.84 0:48.8 5.8%   KIDNEY TRANSPLANT     x 2     There were no vitals filed for this visit.   Subjective Assessment - 03/02/21 1001     Subjective Patient reports experiencing dizziness this AM. She reports no worsening dizziness compared to last week. Patient reports feeling sensation of room moving/eyes spinning. She reports feeing better following PT. She reports notable fatigue following PT sessions. Patient reports difficulty with swelling in her feet for which she is taking Rx medication. Patient reports her blood sugar does sometimes  get too low with taking 3 units of insulin. She reports difficulty with walking in mall or in store due to imbalance and dizziness. 80% global rating of improvement. Pt is following up with cardiology today following carotid artery imaging. 6/10 dizziness at arrival to PT.    Pertinent History Patient is a 70 year old female with primary complaint of dizziness and imbalance. She states this got worse around early 2021 (month unspecified). Patient reports feeling off balance. She reports intermittently feeling some sensation of her head spinning.  She reports dizziness can last throughout the day. She states this is happening every day. Pt reports no change in sypmtoms since onset. She states it feels different than previous bout of vertigo. She states she uses her cane to move about the house. She reports some difficulty with direction of gait - "I'm trying to walk this way, but walk the other way." Patient has hx of chronic kidney disease, bilateral neuropathy affecting LEs and R hand. She has mild numbness affecting her L hand. She feels that Lyrica helps "a little." Patient reports no facial numbness or paresthesias. . Patient denies N&V with dizziness. She reports no notable dysarhtria, some dysphagia with food "not wanting to go down." Patient reports limitation in household cleaning, getting up stairs to access her bedroom (handrail is on left going up). She reports more difficulty with LLE going down steps.  She reports using single-point cane for household mobility, walker for going outside. She uses wheelchair for shopping if scooter is unavailable. Patient reports one fall in last 6 months - falling out of chair. Patient reports no auditory complaints. Pt is s/p kidney transplant x 2 and she has prolapsed uterus; she reports some bloating and increased urgency, though this has not changed in the previous year notably. Pt denies syncope or drop attacks. Patient has been assessed for mild neck pain in  association with neuropathic pain and dizziness with Dr. Jennings Books - consult sent to neurosurgery with concern for possible myelopathy.    Limitations Walking;House hold activities;Standing    Diagnostic tests Impression from radiology as follows:    At C3-C4, there is grade 1 anterolisthesis. 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 T2 hyperintense signal abnormality within the spinal cord at this level, which may reflect focal edema and/or myelomalacia. Bilateral neural foraminal   narrowing (moderate right, severe left).     At C5-C6, a posterior disc osteophyte complex contributes to severe   spinal canal stenosis (greater on the right) with spinal cord flattening. T2 hyperintense signal abnormality within the right   aspect of the spinal cord at this level compatible with myelomalacia and/or focal edema. Bilateral neural foraminal narrowing (severe   right, moderate/severe left).     At C7-T1, a posterior disc osteophyte complex contributes to mild/moderate spinal canal stenosis, contacting and minimally flattening the ventral spinal cord. Bilateral neural foraminal   narrowing (moderate/severe right, severe left).  No more than mild spinal canal stenosis at the remaining levels. Additional sites of neural foraminal narrowing (including additional   sites of severe neural foraminal narrowing), as detailed. Reversal of the expected cervical lordosis. Grade 1 anterolisthesis also present at C4-C5.    Patient Stated Goals Improvement in dizziness and ability to get around home              OBJECTIVE FINDINGS   COORDINATION: Finger to Nose: WFL Heel to Shin: Normal Pronator Drift: Negative Rapid Alternating Movements: Normal Finger to Thumb Opposition: Normal                Functional Mobility: Supervision level of assist during stand pivot transfer from chair to table; rapid transfer of pelvis from surface to surface with no LOB. Supine to sit not assessed  today (IE: Moderate assist for supine to sit on table with difficulty initiating cervical and trunk flexion).    Gait: With single-point cane, patient demonstrates shortened stance time L>R and decreased heel strike at initial contact with mild instability at loading response. Intermittent crossover gait pattern with R lower limb during stance on LLE.      POSTURAL CONTROL TESTS:          Rhomberg Test Eyes Open: WNL Eyes Closed: 27 seconds        FUNCTIONAL OUTCOME MEASURES         Results Comments  BERG 02/04/21: 38/56, 03/02/21: 41/56   Fall risk, in need of intervention. Clinically significant improvement versus baseline  TUG 01/28/21: 30 seconds; 03/02/21: 29 seconds   Fall risk, in need of intervention. No significant change versus baseline.  5TSTS 02/04/21: 27 seconds; 03/02/21: 25 sec    Fall risk, in need of intervention. No significant change versus IE (MCID = 2.3 sec)  DHI 02/04/21: 19; 03/02/21: 32  No change according to established MCID/MDC         TREATMENT     Therapeutic Activities -  repetitive task practice and performance of functional ADLs to improve activity performance, gait training   Re-assessment performed (see above)   *next visit* Sit to stand, from low table; 2x5 In // bars: Forward/retro stepping; x4 D/B with cueing for symmetrical toe-out (avoiding excessive R toe-out) and no cross-over gait Sidestepping; x3 D/B      *not today* Standing heel raise/toe raise; 2x10 alternating Adjustment of single-point cane for appropriate height and gait with SPC x 1 lap in gym      Neuromuscular Re-education - for occulomotor re-training, VOR training, postural stability and balance re-training  Adjacent to raised low mat (on L side of patient) Toe Tap; to 6-inch step; 2x10 alternating      *next visit* VOR x 1 in standing; 1x30; horizontal and vertical, standby assist with patient adjacent to table Standing with eyes closed, feet apart, on Airex;  3x20sec      *not today* Dynamic march; 2x down/back  Quarter Tandem, eyes closed; 2 x 10 sec, bilateral Rhomberg eyes closed; 1x30sec  Rhomberg eyes open on foam; 2x30sec Semitandem eyes closed attempted, unable to maintain postural stability with frequent lean to L Smooth pursuits with visual target manipulated by therapist; horiz and vertical, in sitting; 2x20          ASSESSMENT Patient reports relatively higher level of dizziness this AM and higher DHI relative to initial evaluation; fortunately, DHI is not increased to degree of established minimal detectable change. She feels she as made notable progress with her balance and dizziness to date with low levels of dizziness reported at last 2 sessions. She has significantly improved performance of Rhomberg with eyes closed. She has clinically significant improvement in BERG score, but no notable change in other functional outcome measures today. She has achieved MDC for FOTO, but still has not increased to established long-term goal. She has significant gait instability remaining that can be largely attributed to cervical myelopathy with L>R lower limb weakness. Patient has upcoming ACDF to address this. She has remaining deficits in gaze stability, continuous dizziness, postural stability, gait instability, impaired sensory integration/visual reliance for balance, L>R lower extremity weakness, and difficulty with sit to stand. Patient will benefit from continued skilled therapeutic intervention to address the above deficits as needed for improved function and QoL.                    PT Short Term Goals - 03/03/21 1336       PT SHORT TERM GOAL #1   Title Pt will be independent with HEP in order to improve strength and balance in order to decrease fall risk and improve function at home and work.    Baseline 02/04/21: HEP initiated at second follow-up visit. 03/02/21: Pt is compliant and independent with HEP    Time 3     Period Weeks    Status Achieved    Target Date 02/18/21      PT SHORT TERM GOAL #2   Title Patient will perform independent stand pivot transfer without loss of balance or cueing required from clinician as needed for safe home-level mobility ability to get to her appointments    Baseline 01/28/21: LOB and high guard with significant postural sway during transfer W/C to table. 03/02/21: Patient is able to perform transfer independently with IND management of chair and no LOB    Time 4    Period Weeks    Status Achieved    Target Date 02/25/21  PT Long Term Goals - 03/03/21 1337       PT LONG TERM GOAL #1   Title Patient will demonstrate improved function as evidenced by a score of 51 on FOTO measure for full participation in activities at home and in the community.    Baseline 01/28/21: FOTO 41. 03/02/21: FOTO 45    Time 8    Period Weeks    Status New    Target Date 03/25/21      PT LONG TERM GOAL #2   Title Pt will improve BERG by at least 3 points in order to demonstrate clinically significant improvement in balance.    Baseline 01/28/21: BERG score to be obtained at next f/u visit; 02/04/21: BERG 38/56. 03/02/21: BERG 41/56    Time 8    Period Weeks    Status Achieved    Target Date 03/02/21      PT LONG TERM GOAL #3   Title Pt will decrease DHI score by at least 18 points in order to demonstrate clinically significant reduction in disability    Baseline 01/28/21: [place] to be obtained at next follow-up visit; 02/04/21: [place] 19. 03/02/21: [place] 32    Time 8    Period Weeks    Status New    Target Date 03/18/21      PT LONG TERM GOAL #4   Title Patient will improve TUG to 14 seconds or less indicative of decreased risk of falls and improved ability to perform home-level mobility tasks safely    Baseline 01/28/21: TUG 30 seconds. 03/03/21: TUG 29 seconds    Time 8    Period Weeks    Status New                   Plan - 03/03/21 1402     Clinical Impression  Statement Patient reports relatively higher level of dizziness this AM and higher DHI relative to initial evaluation; fortunately, DHI is not increased to degree of established minimal detectable change. She feels she as made notable progress with her balance and dizziness to date with low levels of dizziness reported at last 2 sessions. She has significantly improved performance of Rhomberg with eyes closed. She has clinically significant improvement in BERG score, but no notable change in other functional outcome measures today. She has achieved MDC for FOTO, but still has not increased to established long-term goal. She has significant gait instability remaining that can be largely attributed to cervical myelopathy with L>R lower limb weakness. Patient has upcoming ACDF to address this. She has remaining deficits in gaze stability, continuous dizziness, postural stability, gait instability, impaired sensory integration/visual reliance for balance, L>R lower extremity weakness, and difficulty with sit to stand. Patient will benefit from continued skilled therapeutic intervention to address the above deficits as needed for improved function and QoL.    Personal Factors and Comorbidities Age;Comorbidity 3+    Comorbidities chronic kidney disease, Type II DM, peripheral neuropathy, hypertension, Hx of A-fib    Examination-Activity Limitations Bed Mobility;Stairs;Stand;Locomotion Level;Transfers    Examination-Participation Restrictions Community Activity;Cleaning    Stability/Clinical Decision Making Unstable/Unpredictable    Rehab Potential Good    PT Frequency 2x / week    PT Duration 8 weeks    PT Treatment/Interventions Gait training;Stair training;Functional mobility training;Therapeutic activities;Therapeutic exercise;Balance training;Neuromuscular re-education;Patient/family education    PT Next Visit Plan Continue with occulomotor re-training, VOR training, LE strengthening and postural stabilty  work. Upcoming ACDF scheduled for 03/24/21.    PT Home Exercise  Plan Access Code [email or web address]    Consulted and Agree with Plan of Care Patient             Patient will benefit from skilled therapeutic intervention in order to improve the following deficits and impairments:  Abnormal gait, Postural dysfunction, Difficulty walking, Decreased balance, Decreased coordination  Visit Diagnosis: Imbalance  Difficulty in walking, not elsewhere classified  Dizziness and giddiness     Problem List There are no problems to display for this patient.  Valentina Gu, PT, DPT [id number]  Eilleen Kempf 03/03/2021, 2:02 PM  [facility] [address] [place], [place], [address] Phone: [phone]   Fax:  [phone]  Name: Jean Johnson MRN: [id number] Date of Birth: 08/22/1950

## 2021-03-03 ENCOUNTER — Encounter: Payer: Self-pay | Admitting: Physical Therapy

## 2021-03-03 ENCOUNTER — Other Ambulatory Visit: Payer: Self-pay | Admitting: Neurosurgery

## 2021-03-04 ENCOUNTER — Ambulatory Visit: Payer: Medicare Other | Admitting: Physical Therapy

## 2021-03-04 ENCOUNTER — Other Ambulatory Visit: Payer: Self-pay

## 2021-03-04 DIAGNOSIS — R2689 Other abnormalities of gait and mobility: Secondary | ICD-10-CM | POA: Diagnosis not present

## 2021-03-04 DIAGNOSIS — R42 Dizziness and giddiness: Secondary | ICD-10-CM

## 2021-03-04 DIAGNOSIS — R262 Difficulty in walking, not elsewhere classified: Secondary | ICD-10-CM

## 2021-03-04 NOTE — Therapy (Signed)
[facility] [address] [place], [place], [address] Phone: [phone]   Fax:  [phone]  Physical Therapy Treatment  Patient Details  Name: Jean Johnson MRN: [id number] Date of Birth: 09/24/1950 Referring Provider (PT): Dr. Vladimir Crofts   Encounter Date: 03/04/2021   PT End of Session - 03/04/21 0942     Visit Number 8    Number of Visits 17    Date for PT Re-Evaluation 03/23/21    Authorization Time Period initial eval 01/28/21, progress note 03/02/21    Progress Note Due on Visit 27    PT Start Time 0944    PT Stop Time 1030    PT Time Calculation (min) 46 min    Equipment Utilized During Treatment Gait belt    Activity Tolerance Patient tolerated treatment well    Behavior During Therapy WFL for tasks assessed/performed             Past Medical History:  Diagnosis Date   Afib ([place])    history of    Diabetes mellitus without complication ([place])    Hypertension    Neuromuscular disorder ([place])    neuropathy    Past Surgical History:  Procedure Laterality Date   CATARACT EXTRACTION W/PHACO Left 10/21/2020   Procedure: CATARACT EXTRACTION PHACO AND INTRAOCULAR LENS PLACEMENT ([place]) DIABETES LEFT;  Surgeon: Leandrew Koyanagi, MD;  Location: [facility];  Service: Ophthalmology;  Laterality: Left;  2.84 0:48.8 5.8%   KIDNEY TRANSPLANT     x 2     There were no vitals filed for this visit.   Subjective Assessment - 03/04/21 0948     Subjective Patient reports not feeling well today due to low back pain. Patient reports burning associated with neuropathy. Patient reports mild dizziness at arrival. She reports 4/10 dizziness this AM. She reports compliance with her home exercise program.    Pertinent History Patient is a 70 year old female with primary complaint of dizziness and imbalance. She states this got worse around early 2021 (month unspecified). Patient reports feeling off balance. She reports  intermittently feeling some sensation of her head spinning.  She reports dizziness can last throughout the day. She states this is happening every day. Pt reports no change in sypmtoms since onset. She states it feels different than previous bout of vertigo. She states she uses her cane to move about the house. She reports some difficulty with direction of gait - "I'm trying to walk this way, but walk the other way." Patient has hx of chronic kidney disease, bilateral neuropathy affecting LEs and R hand. She has mild numbness affecting her L hand. She feels that Lyrica helps "a little." Patient reports no facial numbness or paresthesias. . Patient denies N&V with dizziness. She reports no notable dysarhtria, some dysphagia with food "not wanting to go down." Patient reports limitation in household cleaning, getting up stairs to access her bedroom (handrail is on left going up). She reports more difficulty with LLE going down steps. She reports using single-point cane for household mobility, walker for going outside. She uses wheelchair for shopping if scooter is unavailable. Patient reports one fall in last 6 months - falling out of chair. Patient reports no auditory complaints. Pt is s/p kidney transplant x 2 and she has prolapsed uterus; she reports some bloating and increased urgency, though this has not changed in the previous year notably. Pt denies syncope or drop attacks. Patient has been assessed for mild neck pain in  association with neuropathic pain and dizziness with Dr. Jennings Books - consult sent to neurosurgery with concern for possible myelopathy.    Limitations Walking;House hold activities;Standing    Diagnostic tests Impression from radiology as follows:    At C3-C4, there is grade 1 anterolisthesis. 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 T2 hyperintense signal abnormality within the spinal cord at this level, which may reflect focal edema and/or myelomalacia.  Bilateral neural foraminal   narrowing (moderate right, severe left).     At C5-C6, a posterior disc osteophyte complex contributes to severe   spinal canal stenosis (greater on the right) with spinal cord flattening. T2 hyperintense signal abnormality within the right   aspect of the spinal cord at this level compatible with myelomalacia and/or focal edema. Bilateral neural foraminal narrowing (severe   right, moderate/severe left).     At C7-T1, a posterior disc osteophyte complex contributes to mild/moderate spinal canal stenosis, contacting and minimally flattening the ventral spinal cord. Bilateral neural foraminal   narrowing (moderate/severe right, severe left).  No more than mild spinal canal stenosis at the remaining levels. Additional sites of neural foraminal narrowing (including additional   sites of severe neural foraminal narrowing), as detailed. Reversal of the expected cervical lordosis. Grade 1 anterolisthesis also present at C4-C5.    Patient Stated Goals Improvement in dizziness and ability to get around home                TREATMENT    MHP (unbilled) utilized during pre-treatment for analgesic effect and improved soft tissue extensibility, x 5 minutes along low back with patient seated   Therapeutic Activities - repetitive task practice and performance of functional ADLs to improve activity performance, gait training    In // bars: Sidestepping; x3 D/B   Sit to stand, from standard chair; 2x8   *not today* Forward/retro stepping; x4 D/B with cueing for symmetrical toe-out (avoiding excessive R toe-out) and no cross-over gait Standing heel raise/toe raise; 2x10 alternating Adjustment of single-point cane for appropriate height and gait with SPC x 1 lap in gym         Neuromuscular Re-education - for occulomotor re-training, VOR training, postural stability and balance re-training   Adjacent to raised low mat (on L side of patient) Toe Tap; to 6-inch step; 2x10  alternating    VOR x 1 in standing; 1x30; horizontal and vertical, standby assist with patient adjacent to table  Standing with eyes closed, feet apart, on Airex; 3x30sec   Dynamic march; 2x down/back    *not today* Quarter Tandem, eyes closed; 2 x 10 sec, bilateral Rhomberg eyes closed; 1x30sec  Rhomberg eyes open on foam; 2x30sec Semitandem eyes closed attempted, unable to maintain postural stability with frequent lean to L Smooth pursuits with visual target manipulated by therapist; horiz and vertical, in sitting; 2x20           ASSESSMENT Patient demonstrates improving ability to perform transfers and sit to stand. She exhibits safe transfer from her wheelchair to adjacent standard chair. She has more mild dizziness today compared to last session. She is limited by comorbid back pain, sciatica, and peripheral neuropathy which can impede her activity tolerance. Utilized thermotherapy today to improve her ability to participate in active PT. She has significant gait instability remaining that can be largely attributed to cervical myelopathy with L>R lower limb weakness. Patient has upcoming ACDF to address this. She has remaining deficits in gaze stability, continuous dizziness, postural stability, gait instability, impaired  sensory integration/visual reliance for balance, L>R lower extremity weakness, and difficulty with sit to stand. Patient will benefit from continued skilled therapeutic intervention to address the above deficits as needed for improved function and QoL.           PT Short Term Goals - 03/03/21 1336       PT SHORT TERM GOAL #1   Title Pt will be independent with HEP in order to improve strength and balance in order to decrease fall risk and improve function at home and work.    Baseline 02/04/21: HEP initiated at second follow-up visit. 03/02/21: Pt is compliant and independent with HEP    Time 3    Period Weeks    Status Achieved    Target Date 02/18/21       PT SHORT TERM GOAL #2   Title Patient will perform independent stand pivot transfer without loss of balance or cueing required from clinician as needed for safe home-level mobility ability to get to her appointments    Baseline 01/28/21: LOB and high guard with significant postural sway during transfer W/C to table. 03/02/21: Patient is able to perform transfer independently with IND management of chair and no LOB    Time 4    Period Weeks    Status Achieved    Target Date 02/25/21               PT Long Term Goals - 03/03/21 1337       PT LONG TERM GOAL #1   Title Patient will demonstrate improved function as evidenced by a score of 51 on FOTO measure for full participation in activities at home and in the community.    Baseline 01/28/21: FOTO 41. 03/02/21: FOTO 45    Time 8    Period Weeks    Status New    Target Date 03/25/21      PT LONG TERM GOAL #2   Title Pt will improve BERG by at least 3 points in order to demonstrate clinically significant improvement in balance.    Baseline 01/28/21: BERG score to be obtained at next f/u visit; 02/04/21: BERG 38/56. 03/02/21: BERG 41/56    Time 8    Period Weeks    Status Achieved    Target Date 03/02/21      PT LONG TERM GOAL #3   Title Pt will decrease DHI score by at least 18 points in order to demonstrate clinically significant reduction in disability    Baseline 01/28/21: [place] to be obtained at next follow-up visit; 02/04/21: [place] 19. 03/02/21: [place] 32    Time 8    Period Weeks    Status New    Target Date 03/18/21      PT LONG TERM GOAL #4   Title Patient will improve TUG to 14 seconds or less indicative of decreased risk of falls and improved ability to perform home-level mobility tasks safely    Baseline 01/28/21: TUG 30 seconds. 03/03/21: TUG 29 seconds    Time 8    Period Weeks    Status New                   Plan - 03/04/21 1053     Clinical Impression Statement Patient demonstrates improving ability to perform  transfers and sit to stand. She exhibits safe transfer from her wheelchair to adjacent standard chair. She has more mild dizziness today compared to last session. She is limited by comorbid back pain, sciatica, and  peripheral neuropathy which can impede her activity tolerance. Utilized thermotherapy today to improve her ability to participate in active PT. She has significant gait instability remaining that can be largely attributed to cervical myelopathy with L>R lower limb weakness. Patient has upcoming ACDF to address this. She has remaining deficits in gaze stability, continuous dizziness, postural stability, gait instability, impaired sensory integration/visual reliance for balance, L>R lower extremity weakness, and difficulty with sit to stand. Patient will benefit from continued skilled therapeutic intervention to address the above deficits as needed for improved function and QoL.    Personal Factors and Comorbidities Age;Comorbidity 3+    Comorbidities chronic kidney disease, Type II DM, peripheral neuropathy, hypertension, Hx of A-fib    Examination-Activity Limitations Bed Mobility;Stairs;Stand;Locomotion Level;Transfers    Examination-Participation Restrictions Community Activity;Cleaning    Stability/Clinical Decision Making Unstable/Unpredictable    Rehab Potential Good    PT Frequency 2x / week    PT Duration 8 weeks    PT Treatment/Interventions Gait training;Stair training;Functional mobility training;Therapeutic activities;Therapeutic exercise;Balance training;Neuromuscular re-education;Patient/family education    PT Next Visit Plan Continue with occulomotor re-training, VOR training, LE strengthening and postural stabilty work. Upcoming ACDF scheduled for 03/24/21.    PT Home Exercise Plan Access Code [email or web address]    Consulted and Agree with Plan of Care Patient             Patient will benefit from skilled therapeutic intervention in order to improve the following deficits and  impairments:  Abnormal gait, Postural dysfunction, Difficulty walking, Decreased balance, Decreased coordination  Visit Diagnosis: Imbalance  Difficulty in walking, not elsewhere classified  Dizziness and giddiness     Problem List There are no problems to display for this patient.  Valentina Gu, PT, DPT [id number]  Eilleen Kempf 03/04/2021, 10:54 AM  [facility] [address] [place], [place], [address] Phone: [phone]   Fax:  [phone]  Name: Jean Johnson MRN: [id number] Date of Birth: 03-28-1951

## 2021-03-09 ENCOUNTER — Encounter: Payer: Self-pay | Admitting: Physical Therapy

## 2021-03-09 ENCOUNTER — Other Ambulatory Visit: Payer: Self-pay

## 2021-03-09 ENCOUNTER — Ambulatory Visit: Payer: Medicare Other | Admitting: Physical Therapy

## 2021-03-09 DIAGNOSIS — R262 Difficulty in walking, not elsewhere classified: Secondary | ICD-10-CM

## 2021-03-09 DIAGNOSIS — R42 Dizziness and giddiness: Secondary | ICD-10-CM

## 2021-03-09 DIAGNOSIS — R2689 Other abnormalities of gait and mobility: Secondary | ICD-10-CM

## 2021-03-10 NOTE — Therapy (Signed)
[place] [facility] [address] [place], [place], [address] Phone: [phone]   Fax:  [phone]  Physical Therapy Treatment  Patient Details  Name: Jean Johnson MRN: [id number] Date of Birth: 08/13/1950 Referring Provider (PT): Dr. Vladimir Crofts   Encounter Date: 03/09/2021   PT End of Session - 03/10/21 1258     Visit Number 9    Number of Visits 17    Date for PT Re-Evaluation 03/23/21    Authorization Time Period initial eval 01/28/21, progress note 03/02/21    Progress Note Due on Visit 27    PT Start Time 0953    PT Stop Time 1032    PT Time Calculation (min) 39 min    Equipment Utilized During Treatment Gait belt    Activity Tolerance Patient tolerated treatment well    Behavior During Therapy WFL for tasks assessed/performed             Past Medical History:  Diagnosis Date   Afib ([place])    history of    Diabetes mellitus without complication ([place])    Hypertension    Neuromuscular disorder ([place])    neuropathy    Past Surgical History:  Procedure Laterality Date   CATARACT EXTRACTION W/PHACO Left 10/21/2020   Procedure: CATARACT EXTRACTION PHACO AND INTRAOCULAR LENS PLACEMENT ([place]) DIABETES LEFT;  Surgeon: Leandrew Koyanagi, MD;  Location: [place];  Service: Ophthalmology;  Laterality: Left;  2.84 0:48.8 5.8%   KIDNEY TRANSPLANT     x 2     There were no vitals filed for this visit.   Subjective Assessment - 03/09/21 0955     Subjective Pt reports feeling very tired at arrival to PT. She reports difficulties with comorbid neuropathy affecting her RLE. She reports mild dizziness at arrival to PT, 3/10 this AM. She reports ongoing significant difficulty with imbalance. She reports intermittent retropulsion in standing and having to grab items nearby when walking.    Pertinent History Patient is a 70 year old female with primary complaint of dizziness and imbalance. She states this got worse  around early 2021 (month unspecified). Patient reports feeling off balance. She reports intermittently feeling some sensation of her head spinning.  She reports dizziness can last throughout the day. She states this is happening every day. Pt reports no change in sypmtoms since onset. She states it feels different than previous bout of vertigo. She states she uses her cane to move about the house. She reports some difficulty with direction of gait - "I'm trying to walk this way, but walk the other way." Patient has hx of chronic kidney disease, bilateral neuropathy affecting LEs and R hand. She has mild numbness affecting her L hand. She feels that Lyrica helps "a little." Patient reports no facial numbness or paresthesias. . Patient denies N&V with dizziness. She reports no notable dysarhtria, some dysphagia with food "not wanting to go down." Patient reports limitation in household cleaning, getting up stairs to access her bedroom (handrail is on left going up). She reports more difficulty with LLE going down steps. She reports using single-point cane for household mobility, walker for going outside. She uses wheelchair for shopping if scooter is unavailable. Patient reports one fall in last 6 months - falling out of chair. Patient reports no auditory complaints. Pt is s/p kidney transplant x 2 and she has prolapsed uterus; she reports some bloating and increased urgency, though this has not changed in the previous year notably. Pt denies  syncope or drop attacks. Patient has been assessed for mild neck pain in association with neuropathic pain and dizziness with Dr. Jennings Books - consult sent to neurosurgery with concern for possible myelopathy.    Limitations Walking;House hold activities;Standing    Diagnostic tests Impression from radiology as follows:    At C3-C4, there is grade 1 anterolisthesis. 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 T2 hyperintense signal abnormality  within the spinal cord at this level, which may reflect focal edema and/or myelomalacia. Bilateral neural foraminal   narrowing (moderate right, severe left).     At C5-C6, a posterior disc osteophyte complex contributes to severe   spinal canal stenosis (greater on the right) with spinal cord flattening. T2 hyperintense signal abnormality within the right   aspect of the spinal cord at this level compatible with myelomalacia and/or focal edema. Bilateral neural foraminal narrowing (severe   right, moderate/severe left).     At C7-T1, a posterior disc osteophyte complex contributes to mild/moderate spinal canal stenosis, contacting and minimally flattening the ventral spinal cord. Bilateral neural foraminal   narrowing (moderate/severe right, severe left).  No more than mild spinal canal stenosis at the remaining levels. Additional sites of neural foraminal narrowing (including additional   sites of severe neural foraminal narrowing), as detailed. Reversal of the expected cervical lordosis. Grade 1 anterolisthesis also present at C4-C5.    Patient Stated Goals Improvement in dizziness and ability to get around home                TREATMENT     Therapeutic Activities - repetitive task practice and performance of functional ADLs to improve activity performance, gait training     In // bars: Sidestepping; x3 D/B   Step over targets, blue agility ladder (stepping over horizontal lines, step-to); x4 down/back   *not today* Sit to stand, from standard chair; 2x8 Forward/retro stepping; x4 D/B with cueing for symmetrical toe-out (avoiding excessive R toe-out) and no cross-over gait Standing heel raise/toe raise; 2x10 alternating Adjustment of single-point cane for appropriate height and gait with SPC x 1 lap in gym         Neuromuscular Re-education - for occulomotor re-training, VOR training, postural stability and balance re-training   At base of stairs:  Toe Tap; to 6-inch step; 2x10  alternating    In // bars: VOR x 1 in standing; 1x30; horizontal and vertical,  Standing with eyes closed, feet apart, on Airex; 3x30sec Dynamic march; 2x down/back   Campbell [employer]; x20 down/back, 3 balls on string, anchored on bathroom door     *not today* Quarter Tandem, eyes closed; 2 x 10 sec, bilateral Rhomberg eyes closed; 1x30sec  Rhomberg eyes open on foam; 2x30sec Semitandem eyes closed attempted, unable to maintain postural stability with frequent lean to L Smooth pursuits with visual target manipulated by therapist; horiz and vertical, in sitting; 2x20       *not today*   MHP (unbilled) utilized during pre-treatment for analgesic effect and improved soft tissue extensibility, x 5 minutes along low back with patient seated     ASSESSMENT Patient demonstrates ongoing significant difficulty with maintaining postural control without visual input and has notable gait instability and LE weakness. She is able to perform Fransisco Beau string without significant increase in symptoms, though she does have mild symptoms with VOR drills. She is exhibiting improved independence and safety with transferring from her chair (improved sit to standand stand-pivot transfer). 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 Patient has upcoming ACDF to address this. She has remaining deficits in gaze stability, continuous dizziness, postural stability, gait instability, impaired sensory integration/visual reliance for balance, L>R lower extremity weakness, and difficulty with sit to stand. Patient will benefit from continued skilled therapeutic intervention to address the above deficits as needed for improved function and QoL.         PT Short Term Goals - 03/03/21 1336       PT SHORT TERM GOAL #1   Title Pt will be independent with HEP in order to improve strength and balance in order to decrease fall risk and improve  function at home and work.    Baseline 02/04/21: HEP initiated at second follow-up visit. 03/02/21: Pt is compliant and independent with HEP    Time 3    Period Weeks    Status Achieved    Target Date 02/18/21      PT SHORT TERM GOAL #2   Title Patient will perform independent stand pivot transfer without loss of balance or cueing required from clinician as needed for safe home-level mobility ability to get to her appointments    Baseline 01/28/21: LOB and high guard with significant postural sway during transfer W/C to table. 03/02/21: Patient is able to perform transfer independently with IND management of chair and no LOB    Time 4    Period Weeks    Status Achieved    Target Date 02/25/21               PT Long Term Goals - 03/03/21 1337       PT LONG TERM GOAL #1   Title Patient will demonstrate improved function as evidenced by a score of 51 on FOTO measure for full participation in activities at home and in the community.    Baseline 01/28/21: FOTO 41. 03/02/21: FOTO 45    Time 8    Period Weeks    Status New    Target Date 03/25/21      PT LONG TERM GOAL #2   Title Pt will improve BERG by at least 3 points in order to demonstrate clinically significant improvement in balance.    Baseline 01/28/21: BERG score to be obtained at next f/u visit; 02/04/21: BERG 38/56. 03/02/21: BERG 41/56    Time 8    Period Weeks    Status Achieved    Target Date 03/02/21      PT LONG TERM GOAL #3   Title Pt will decrease DHI score by at least 18 points in order to demonstrate clinically significant reduction in disability    Baseline 01/28/21: [place] to be obtained at next follow-up visit; 02/04/21: [place] 19. 03/02/21: [place] 32    Time 8    Period Weeks    Status New    Target Date 03/18/21      PT LONG TERM GOAL #4   Title Patient will improve TUG to 14 seconds or less indicative of decreased risk of falls and improved ability to perform home-level mobility tasks safely    Baseline 01/28/21: TUG  30 seconds. 03/03/21: TUG 29 seconds    Time 8    Period Weeks    Status New                   Plan - 03/10/21 1302     Clinical Impression Statement Patient demonstrates ongoing significant difficulty with maintaining postural control without visual  input and has notable gait instability and LE weakness. She is able to perform Fransisco Beau string without significant increase in symptoms, though she does have mild symptoms with VOR drills. She is exhibiting improved independence and safety with transferring from her chair (improved sit to standand stand-pivot transfer). She has significant gait instability remaining that can be largely attributed to cervical myelopathy with L>R lower limb weakness. Patient has upcoming ACDF to address this. She has remaining deficits in gaze stability, continuous dizziness, postural stability, gait instability, impaired sensory integration/visual reliance for balance, L>R lower extremity weakness, and difficulty with sit to stand. Patient will benefit from continued skilled therapeutic intervention to address the above deficits as needed for improved function and QoL.    Personal Factors and Comorbidities Age;Comorbidity 3+    Comorbidities chronic kidney disease, Type II DM, peripheral neuropathy, hypertension, Hx of A-fib    Examination-Activity Limitations Bed Mobility;Stairs;Stand;Locomotion Level;Transfers    Examination-Participation Restrictions Community Activity;Cleaning    Stability/Clinical Decision Making Unstable/Unpredictable    Rehab Potential Good    PT Frequency 2x / week    PT Duration 8 weeks    PT Treatment/Interventions Gait training;Stair training;Functional mobility training;Therapeutic activities;Therapeutic exercise;Balance training;Neuromuscular re-education;Patient/family education    PT Next Visit Plan Continue with occulomotor re-training, VOR training, LE strengthening and postural stabilty work. Upcoming ACDF scheduled for 03/24/21.     PT Home Exercise Plan Access Code [email or web address]    Consulted and Agree with Plan of Care Patient             Patient will benefit from skilled therapeutic intervention in order to improve the following deficits and impairments:  Abnormal gait, Postural dysfunction, Difficulty walking, Decreased balance, Decreased coordination  Visit Diagnosis: Imbalance  Difficulty in walking, not elsewhere classified  Dizziness and giddiness     Problem List There are no problems to display for this patient.  Valentina Gu, PT, DPT [id number]  Eilleen Kempf 03/10/2021, 1:02 PM  [place] [facility] [address] [place], [place], [address] Phone: [phone]   Fax:  [phone]  Name: Jerry Akram MRN: [id number] Date of Birth: 1951/02/04

## 2021-03-11 ENCOUNTER — Other Ambulatory Visit: Payer: Self-pay

## 2021-03-11 ENCOUNTER — Encounter: Payer: Self-pay | Admitting: Physical Therapy

## 2021-03-11 ENCOUNTER — Ambulatory Visit: Payer: Medicare Other | Attending: Neurology | Admitting: Physical Therapy

## 2021-03-11 DIAGNOSIS — R42 Dizziness and giddiness: Secondary | ICD-10-CM | POA: Diagnosis present

## 2021-03-11 DIAGNOSIS — R2689 Other abnormalities of gait and mobility: Secondary | ICD-10-CM | POA: Insufficient documentation

## 2021-03-11 DIAGNOSIS — R262 Difficulty in walking, not elsewhere classified: Secondary | ICD-10-CM | POA: Diagnosis present

## 2021-03-11 NOTE — Therapy (Signed)
[facility] [address] [place], [place], [address] Phone: [phone]   Fax:  [phone]  Physical Therapy Treatment  Patient Details  Name: Jean Johnson MRN: [id number] Date of Birth: Dec 04, 1950 Referring Provider (PT): Dr. Vladimir Crofts   Encounter Date: 03/11/2021   PT End of Session - 03/11/21 0956     Visit Number 10    Number of Visits 17    Date for PT Re-Evaluation 03/23/21    Authorization Time Period initial eval 01/28/21, progress note 03/02/21    Progress Note Due on Visit 27    PT Start Time 0948    PT Stop Time 1030    PT Time Calculation (min) 42 min    Equipment Utilized During Treatment Gait belt    Activity Tolerance Patient tolerated treatment well    Behavior During Therapy WFL for tasks assessed/performed             Past Medical History:  Diagnosis Date   Afib ([place])    history of    Diabetes mellitus without complication ([place])    Hypertension    Neuromuscular disorder ([place])    neuropathy    Past Surgical History:  Procedure Laterality Date   CATARACT EXTRACTION W/PHACO Left 10/21/2020   Procedure: CATARACT EXTRACTION PHACO AND INTRAOCULAR LENS PLACEMENT ([place]) DIABETES LEFT;  Surgeon: Leandrew Koyanagi, MD;  Location: [place];  Service: Ophthalmology;  Laterality: Left;  2.84 0:48.8 5.8%   KIDNEY TRANSPLANT     x 2     There were no vitals filed for this visit.   Subjective Assessment - 03/11/21 0953     Subjective Patient reports 3/10 dizziness at arrival to PT. She has comorbid L-sided low back pain. She denies significant dizziness with lying - she reports that lying back and relaxing her head "calms down" symptoms. Patient reports that she has made fair progress to date. Patient reports doing well after last PT visit. Patient reports no major incidents/LOB or near fall recently. Pt has upcoming ACDF 03/24/21.    Pertinent History Patient is a 70 year old female with  primary complaint of dizziness and imbalance. She states this got worse around early 2021 (month unspecified). Patient reports feeling off balance. She reports intermittently feeling some sensation of her head spinning.  She reports dizziness can last throughout the day. She states this is happening every day. Pt reports no change in sypmtoms since onset. She states it feels different than previous bout of vertigo. She states she uses her cane to move about the house. She reports some difficulty with direction of gait - "I'm trying to walk this way, but walk the other way." Patient has hx of chronic kidney disease, bilateral neuropathy affecting LEs and R hand. She has mild numbness affecting her L hand. She feels that Lyrica helps "a little." Patient reports no facial numbness or paresthesias. . Patient denies N&V with dizziness. She reports no notable dysarhtria, some dysphagia with food "not wanting to go down." Patient reports limitation in household cleaning, getting up stairs to access her bedroom (handrail is on left going up). She reports more difficulty with LLE going down steps. She reports using single-point cane for household mobility, walker for going outside. She uses wheelchair for shopping if scooter is unavailable. Patient reports one fall in last 6 months - falling out of chair. Patient reports no auditory complaints. Pt is s/p kidney transplant x 2 and she has prolapsed uterus; she reports some  bloating and increased urgency, though this has not changed in the previous year notably. Pt denies syncope or drop attacks. Patient has been assessed for mild neck pain in association with neuropathic pain and dizziness with Dr. Jennings Books - consult sent to neurosurgery with concern for possible myelopathy.    Limitations Walking;House hold activities;Standing    Diagnostic tests Impression from radiology as follows:    At C3-C4, there is grade 1 anterolisthesis. 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 T2 hyperintense signal abnormality within the spinal cord at this level, which may reflect focal edema and/or myelomalacia. Bilateral neural foraminal   narrowing (moderate right, severe left).     At C5-C6, a posterior disc osteophyte complex contributes to severe   spinal canal stenosis (greater on the right) with spinal cord flattening. T2 hyperintense signal abnormality within the right   aspect of the spinal cord at this level compatible with myelomalacia and/or focal edema. Bilateral neural foraminal narrowing (severe   right, moderate/severe left).     At C7-T1, a posterior disc osteophyte complex contributes to mild/moderate spinal canal stenosis, contacting and minimally flattening the ventral spinal cord. Bilateral neural foraminal   narrowing (moderate/severe right, severe left).  No more than mild spinal canal stenosis at the remaining levels. Additional sites of neural foraminal narrowing (including additional   sites of severe neural foraminal narrowing), as detailed. Reversal of the expected cervical lordosis. Grade 1 anterolisthesis also present at C4-C5.    Patient Stated Goals Improvement in dizziness and ability to get around home               TREATMENT    MHP (unbilled) utilized during pre-treatment for analgesic effect and improved soft tissue extensibility, x 5 minutes along low back with patient seated    Therapeutic Activities - repetitive task practice and performance of functional ADLs to improve activity performance, gait training   In // bars: Sidestepping; x3 D/B Forward/retro stepping; x4 D/B with cueing for symmetrical toe-out (avoiding excessive R toe-out) and no cross-over gait  Sit to stand, from standard chair; 2x10  Ambulate laps in gym with CGA using gait belt; x2   *next visit* Step over targets, blue agility ladder (stepping over horizontal lines, step-to); x4 down/back  *not today* Standing heel raise/toe  raise; 2x10 alternating Adjustment of single-point cane for appropriate height and gait with SPC x 1 lap in gym         Neuromuscular Re-education - for occulomotor re-training, VOR training, postural stability and balance re-training    In // bars: VOR x 1 in standing; 1x30; horizontal and vertical, with narrow BOS Standing with eyes closed, narrow BOS, on Airex; 3x30sec    Campbell Soup; x20 down/back, 3 balls on string, anchored on bathroom door     *not today* Dynamic march; 2x down/back At base of stairs:  Toe Tap; to 6-inch step; 2x10 alternating  Quarter Tandem, eyes closed; 2 x 10 sec, bilateral Rhomberg eyes closed; 1x30sec  Rhomberg eyes open on foam; 2x30sec Semitandem eyes closed attempted, unable to maintain postural stability with frequent lean to L Smooth pursuits with visual target manipulated by therapist; horiz and vertical, in sitting; 2x20          ASSESSMENT Patient demonstrates improved ability to maintain postural control with narrow BOS. She exhibits ongoing gait stability with decreased stance time on LLE. She reports relatively mild symptoms of dizziness recently - this is not notably exacerbated with activities performed in clinic. She  has remaining deficits in gaze stability, continuous dizziness, postural stability, gait instability, impaired sensory integration/visual reliance for balance, L>R lower extremity weakness, and difficulty with sit to stand. Patient will benefit from continued skilled therapeutic intervention to address the above deficits as needed for improved function and QoL.           PT Short Term Goals - 03/03/21 1336       PT SHORT TERM GOAL #1   Title Pt will be independent with HEP in order to improve strength and balance in order to decrease fall risk and improve function at home and work.    Baseline 02/04/21: HEP initiated at second follow-up visit. 03/02/21: Pt is compliant and independent with HEP    Time 3    Period  Weeks    Status Achieved    Target Date 02/18/21      PT SHORT TERM GOAL #2   Title Patient will perform independent stand pivot transfer without loss of balance or cueing required from clinician as needed for safe home-level mobility ability to get to her appointments    Baseline 01/28/21: LOB and high guard with significant postural sway during transfer W/C to table. 03/02/21: Patient is able to perform transfer independently with IND management of chair and no LOB    Time 4    Period Weeks    Status Achieved    Target Date 02/25/21               PT Long Term Goals - 03/03/21 1337       PT LONG TERM GOAL #1   Title Patient will demonstrate improved function as evidenced by a score of 51 on FOTO measure for full participation in activities at home and in the community.    Baseline 01/28/21: FOTO 41. 03/02/21: FOTO 45    Time 8    Period Weeks    Status New    Target Date 03/25/21      PT LONG TERM GOAL #2   Title Pt will improve BERG by at least 3 points in order to demonstrate clinically significant improvement in balance.    Baseline 01/28/21: BERG score to be obtained at next f/u visit; 02/04/21: BERG 38/56. 03/02/21: BERG 41/56    Time 8    Period Weeks    Status Achieved    Target Date 03/02/21      PT LONG TERM GOAL #3   Title Pt will decrease DHI score by at least 18 points in order to demonstrate clinically significant reduction in disability    Baseline 01/28/21: Wall to be obtained at next follow-up visit; 02/04/21: Allison 19. 03/02/21: Macy 32    Time 8    Period Weeks    Status New    Target Date 03/18/21      PT LONG TERM GOAL #4   Title Patient will improve TUG to 14 seconds or less indicative of decreased risk of falls and improved ability to perform home-level mobility tasks safely    Baseline 01/28/21: TUG 30 seconds. 03/03/21: TUG 29 seconds    Time 8    Period Weeks    Status New                   Plan - 03/11/21 1940     Clinical Impression  Statement Patient demonstrates improved ability to maintain postural control with narrow BOS. She exhibits ongoing gait stability with decreased stance time on LLE. She reports relatively mild symptoms of  dizziness recently - this is not notably exacerbated with activities performed in clinic. She has remaining deficits in gaze stability, continuous dizziness, postural stability, gait instability, impaired sensory integration/visual reliance for balance, L>R lower extremity weakness, and difficulty with sit to stand. Patient will benefit from continued skilled therapeutic intervention to address the above deficits as needed for improved function and QoL.    Personal Factors and Comorbidities Age;Comorbidity 3+    Comorbidities chronic kidney disease, Type II DM, peripheral neuropathy, hypertension, Hx of A-fib    Examination-Activity Limitations Bed Mobility;Stairs;Stand;Locomotion Level;Transfers    Examination-Participation Restrictions Community Activity;Cleaning    Stability/Clinical Decision Making Unstable/Unpredictable    Rehab Potential Good    PT Frequency 2x / week    PT Duration 8 weeks    PT Treatment/Interventions Gait training;Stair training;Functional mobility training;Therapeutic activities;Therapeutic exercise;Balance training;Neuromuscular re-education;Patient/family education    PT Next Visit Plan Continue with occulomotor re-training, VOR training, LE strengthening and postural stabilty work. Upcoming ACDF scheduled for 03/24/21.    PT Home Exercise Plan Access Code [email or web address]    Consulted and Agree with Plan of Care Patient             Patient will benefit from skilled therapeutic intervention in order to improve the following deficits and impairments:  Abnormal gait, Postural dysfunction, Difficulty walking, Decreased balance, Decreased coordination  Visit Diagnosis: Imbalance  Difficulty in walking, not elsewhere classified  Dizziness and giddiness     Problem  List There are no problems to display for this patient.  Valentina Gu, PT, DPT [id number]  Eilleen Kempf 03/11/2021, 7:40 PM  [facility] [address] [place], [place], [address] Phone: [phone]   Fax:  [phone]  Name: Jean Johnson MRN: [id number] Date of Birth: 01/29/1951

## 2021-03-16 ENCOUNTER — Other Ambulatory Visit: Payer: Self-pay

## 2021-03-16 ENCOUNTER — Encounter: Payer: 59 | Admitting: Physical Therapy

## 2021-03-16 ENCOUNTER — Encounter
Admission: RE | Admit: 2021-03-16 | Discharge: 2021-03-16 | Disposition: A | Discharge status: Home / Self Care | Payer: Medicare Other | Source: Ambulatory Visit | Attending: Neurosurgery | Admitting: Neurosurgery

## 2021-03-16 DIAGNOSIS — I4510 Unspecified right bundle-branch block: Secondary | ICD-10-CM | POA: Diagnosis not present

## 2021-03-16 DIAGNOSIS — Z01810 Encounter for preprocedural cardiovascular examination: Secondary | ICD-10-CM

## 2021-03-16 DIAGNOSIS — Z01818 Encounter for other preprocedural examination: Secondary | ICD-10-CM | POA: Diagnosis present

## 2021-03-16 DIAGNOSIS — I440 Atrioventricular block, first degree: Secondary | ICD-10-CM | POA: Diagnosis not present

## 2021-03-16 HISTORY — DX: Anemia, unspecified: D64.9

## 2021-03-16 HISTORY — DX: Other specified postprocedural states: R11.2

## 2021-03-16 HISTORY — DX: Cardiac arrhythmia, unspecified: I49.9

## 2021-03-16 HISTORY — DX: Dyspnea, unspecified: R06.00

## 2021-03-16 HISTORY — DX: Other specified postprocedural states: Z98.890

## 2021-03-16 HISTORY — DX: Chronic kidney disease, unspecified: N18.9

## 2021-03-16 LAB — BASIC METABOLIC PANEL
Anion gap: 6 (ref 5–15)
BUN: 19 mg/dL (ref 8–23)
CO2: 26 mmol/L (ref 22–32)
Calcium: 9.5 mg/dL (ref 8.9–10.3)
Chloride: 112 mmol/L — ABNORMAL HIGH (ref 98–111)
Creatinine, Ser: 1.18 mg/dL — ABNORMAL HIGH (ref 0.44–1.00)
GFR, Estimated: 49 mL/min — ABNORMAL LOW (ref >60)
Glucose, Bld: 85 mg/dL (ref 70–99)
Potassium: 3.8 mmol/L (ref 3.5–5.1)
Sodium: 144 mmol/L (ref 135–145)

## 2021-03-16 LAB — TYPE AND SCREEN
ABO/RH(D): A POS
Antibody Screen: NEGATIVE

## 2021-03-16 LAB — CBC
HCT: 35.2 % — ABNORMAL LOW (ref 36.0–46.0)
Hemoglobin: 11.6 g/dL — ABNORMAL LOW (ref 12.0–15.0)
MCH: 32.3 pg (ref 26.0–34.0)
MCHC: 33 g/dL (ref 30.0–36.0)
MCV: 98.1 fL (ref 80.0–100.0)
Platelets: 201 10*3/uL (ref 150–400)
RBC: 3.59 MIL/uL — ABNORMAL LOW (ref 3.87–5.11)
RDW: 13.8 % (ref 11.5–15.5)
WBC: 6.4 10*3/uL (ref 4.0–10.5)
nRBC: 0 % (ref 0.0–0.2)

## 2021-03-16 LAB — SURGICAL PCR SCREEN
MRSA, PCR: NEGATIVE
Staphylococcus aureus: NEGATIVE

## 2021-03-16 LAB — PROTIME-INR
INR: 1.2 (ref 0.8–1.2)
Prothrombin Time: 14.9 seconds (ref 11.4–15.2)

## 2021-03-16 LAB — APTT: aPTT: 30 seconds (ref 24–36)

## 2021-03-16 NOTE — Patient Instructions (Addendum)
Your procedure is scheduled on: Wednesday March 24, 2021. Report to Day Surgery inside [facility] 2nd floor stop by admissions desk first before getting on elevator. To find out your arrival time please call [phone] between 1PM - 3PM on Tuesday March 23, 2021.  Remember: Instructions that are not followed completely may result in serious medical risk,  up to and including death, or upon the discretion of your surgeon and anesthesiologist your  surgery may need to be rescheduled.     _X__ 1. Do not eat food after midnight the night before your procedure.                 No chewing gum or hard candies. You may drink clear liquids up to 2 hours                 before you are scheduled to arrive for your surgery- DO not drink clear                 liquids within 2 hours of the start of your surgery.                 Clear Liquids include:  water,  Black Coffee or Tea (Do not add                 anything to coffee or tea).  __X__2.  On the morning of surgery brush your teeth with toothpaste and water, you                may rinse your mouth with mouthwash if you wish.  Do not swallow any toothpaste of mouthwash.     _X__ 3.  No Alcohol for 24 hours before or after surgery.   _X__ 4.  Do Not Smoke or use e-cigarettes For 24 Hours Prior to Your Surgery.                 Do not use any chewable tobacco products for at least 6 hours prior to                 Surgery.  _X__  5.  Do not use any recreational drugs (marijuana, cocaine, heroin, ecstasy, MDMA or other)                For at least one week prior to your surgery.  Combination of these drugs with anesthesia                May have life threatening results.  __X__6.  Notify your doctor if there is any change in your medical condition      (cold, fever, infections).     Do not wear jewelry, make-up, hairpins, clips or nail polish. Do not wear lotions, powders, or perfumes. You may wear deodorant. Do  not shave 48 hours prior to surgery. Men may shave face and neck. Do not bring valuables to the hospital.    [facility] is not responsible for any belongings or valuables.  Contacts, dentures or bridgework may not be worn into surgery. Leave your suitcase in the car. After surgery it may be brought to your room. For patients admitted to the hospital, discharge time is determined by your treatment team.   Patients discharged the day of surgery will not be allowed to drive home.   Make arrangements for someone to be with you for the first 24 hours of your Same Day Discharge.   __X__ Take these medicines the morning  of surgery with A SIP OF WATER:    1. tacrolimus (PROGRAF) 3 mg  2. pregabalin (LYRICA) 25 MG  3. mycophenolate (CELLCEPT) 500 MG   4. flecainide (TAMBOCOR) 100 MG   5. ezetimibe (ZETIA) 10 MG  6. atorvastatin (LIPITOR) 10 MG  ____ Fleet Enema (as directed)   __X__ Use CHG Soap (or wipes) as directed  ____ Use Benzoyl Peroxide Gel as instructed  ____ Use inhalers on the day of surgery  ____ Stop metformin 2 days prior to surgery    __X__ Take 1/2 of usual insulin dose the night before surgery. No insulin the morning          of surgery. insulin detemir (LEVEMIR) 100 UNIT/ML injection  __X__ Stop apixaban (ELIQUIS) 5 MG TABS 5 days prior to your surgery as instructed by your doctor. (last dose Saturday 03/20/21)  __X__ One Week prior to surgery- Stop Anti-inflammatories such as Ibuprofen, Aleve, Advil, Motrin, meloxicam (MOBIC), diclofenac, etodolac, ketorolac, Toradol, Daypro, piroxicam, Goody's or BC powders. OK TO USE TYLENOL IF NEEDED   __X__ Do not start any new supplements prior to your surgery    ____ Bring C-Pap to the hospital.    If you have any questions regarding your pre-procedure instructions,  Please call Pre-admit Testing at [phone]

## 2021-03-17 ENCOUNTER — Encounter: Payer: Self-pay | Admitting: Neurosurgery

## 2021-03-17 ENCOUNTER — Ambulatory Visit: Payer: Medicare Other | Admitting: Physical Therapy

## 2021-03-17 ENCOUNTER — Encounter: Payer: Self-pay | Admitting: Physical Therapy

## 2021-03-17 VITALS — BP 162/58

## 2021-03-17 DIAGNOSIS — R2689 Other abnormalities of gait and mobility: Secondary | ICD-10-CM

## 2021-03-17 DIAGNOSIS — R262 Difficulty in walking, not elsewhere classified: Secondary | ICD-10-CM

## 2021-03-17 DIAGNOSIS — R42 Dizziness and giddiness: Secondary | ICD-10-CM

## 2021-03-17 NOTE — Progress Notes (Signed)
Perioperative Services  Pre-Admission/Anesthesia Testing Clinical Review  Date: 03/18/21  Patient Demographics:  Name: Jean Johnson DOB:   08-28-1950 MRN:   [id number]  Planned Surgical Procedure(s):    Case: [id number] Date/Time: 03/24/21 0929   Procedure: C3-6 ANTERIOR CERVICAL DECOMPRESSION/DISCECTOMY FUSION 3 LEVELS   Anesthesia type: General   Pre-op diagnosis: cervical myelopathy g95.9   Location: [facility] OR ROOM 03 / [facility]   Surgeons: Meade Maw, MD   NOTE: Available PAT nursing documentation and vital signs have been reviewed. Clinical nursing staff has updated patient's PMH/PSHx, current medication list, and drug allergies/intolerances to ensure comprehensive history available to assist in medical decision making as it pertains to the aforementioned surgical procedure and anticipated anesthetic course. Extensive review of available clinical information performed. [place] PMH and PSHx updated with any diagnoses/procedures that  may have been inadvertently omitted during her intake with the pre-admission testing department's nursing staff.  Clinical Discussion:  Jean Johnson is a 70 y.o. female who is submitted for pre-surgical anesthesia review and clearance prior to her undergoing the above procedure. Patient has never been a smoker. Pertinent PMH includes: atrial fibrillation, 1st degree AV block, IRBBB, cardiac murmur, valvular heart disease, left atrial enlargement, aortic stenosis, HLD, posttransplant diabetes, ESRD (s/p renal transplant), dyspnea, anemia of chronic renal disease, cervical spinal stenosis, diabetic neuropathy.  Patient is followed by cardiology Clayborn Bigness, MD). She was last seen in the cardiology clinic on 03/02/2021; notes reviewed. At the time of her clinic visit, the patient denied any chest pain, shortness of breath, PND, orthopnea, palpitations, significant peripheral edema, vertiginous symptoms, or presyncope/syncope.   Patient with a PMH significant for cardiovascular diagnoses.  TTE performed on 11/23/2020 revealed globally normal systolic function with moderate LVH.  LVEF estimated at >55%.  There was mild pan valvular regurgitation noted.  Additionally, there was mild aortic valve stenosis observed; mean gradient 9.7 mmHg; AVA (VTI) = 1.33 cm.  There was a small/mild pericardial effusion.  PMH significant for atrial fibrillation.  CHA2DS2-VASc Score = 3  (age, sex, diabetes).  Rate and rhythm controlled with oral diltiazem and flecainide.  Patient chronically anticoagulated with daily apixaban; compliant with therapy with no evidence of GI bleeding.  Patient with a significant renovascular history.  She underwent a living donor transplant in 2000 for her sister, however rejected the organ in 2006.  Patient underwent a cadaveric transplant in 2010 and has remained on antirejection therapy using mycophenolate, prednisone, and tacrolimus since.  Post transplant, patient developed diabetes, which is well controlled; last Hgb A1c was 6.2% when checked on 10/14/2020.  Functional capacity limited by back pain; patient ambulates with the assistance of a cane.  Despite pain, patient felt to be able to achieve at least 4 METS of activity.  Patient stable overall from a cardiovascular perspective.  No changes were made to her medication regimen.  Patient to follow-up with outpatient cardiology in 4 to 6 months or sooner if needed.  Patient is scheduled to undergo a C3-C6 ACDF on 03/24/2021 with Dr. Meade Maw, MD.  Given patient's past medical history significant for cardiovascular and renovascular diagnoses, presurgical clearances were sought from patient's primary cardiologist and nephrologist.  Specialty clearances were obtained as follows.  Per nephrology Lanora Manis, MD), "this patient is optimized for surgery.  Patient is cleared from a renal standpoint for proposed surgery and may proceed at a LOW risk".  Per  cardiology Clayborn Bigness, MD), "this patient is optimized for surgery and may proceed with the  planned procedural course with an ACCEPTABLE risk of significant perioperative cardiovascular complications".  Again, this patient is on daily anticoagulation therapy.  She has been instructed on recommendations for holding her apixaban for 3 days prior to her procedure with plans to restart as soon as postoperative bleeding risk felt to be minimized by her primary attending surgeon.  The patient is aware that her last dose of apixaban will be on 03/20/2021.  Patient reports previous perioperative complications with anesthesia in the past. Patient has a PMH (+) for PONV.  Order placed by PAT for patient to receive a prophylactic preoperative dose of aprepitant. In review of the available records, it is noted that patient underwent a MAC anesthetic course at [facility] ([facility]) in 10/2020 without documented complications.   Vitals with BMI 03/17/2021 03/17/2021 03/16/2021  Height - - _0   Weight - - 116 lbs 14 oz  BMI - - 94.5  Systolic 859 292 446  Diastolic 58 62 72  Pulse - - 68    Providers/Specialists:   NOTE: Primary physician provider listed below. Patient may have been seen by APP or partner within same practice.   PROVIDER ROLE / SPECIALTY LAST Dola Factor, MD Neurosurgery 02/18/2021  Lyla Son, MD Primary Care Provider 01/21/2021  Katrine Coho, MD Cardiology 03/02/2021  Lavone Orn, MD Endocrinology 12/15/2020  Jennings Books, MD Neurology 01/15/2021   Allergies:  E-mycin [erythromycin] and Penicillins  Current Home Medications:   No current facility-administered medications for this encounter.    apixaban (ELIQUIS) 5 MG TABS tablet   Artificial Tear Ointment (DRY EYES OP)   atorvastatin (LIPITOR) 10 MG tablet   cyanocobalamin 2000 MCG tablet   diltiazem (CARDIZEM CD) 240 MG 24 hr capsule   ezetimibe (ZETIA) 10 MG tablet   flecainide (TAMBOCOR) 100 MG  tablet   insulin detemir (LEVEMIR) 100 UNIT/ML injection   insulin lispro (HUMALOG) 100 UNIT/ML KwikPen   lisinopril (ZESTRIL) 30 MG tablet   mycophenolate (CELLCEPT) 500 MG tablet   Potassium Chloride ER 20 MEQ TBCR   predniSONE (DELTASONE) 5 MG tablet   pregabalin (LYRICA) 25 MG capsule   tacrolimus (PROGRAF) 1 MG capsule   History:   Past Medical History:  Diagnosis Date   Anemia of chronic renal disease    Aortic stenosis, mild    a.) TTE on 11/23/2020 --> mean gradient 9.7 mmHg   Atrial fibrillation (HCC)    a.) CHA2DS2-VASc = 3 (age, sex, diabetes). b.) daily apixaban   B12 deficiency    Cervical spinal stenosis    Chronic anticoagulation    Apixaban   Diabetic neuropathy (HCC)    Diverticulosis    Dyspnea    ESRD (end stage renal disease) (HCC)    First degree AV block    HLD (hyperlipidemia)    Hypertension    Incomplete right bundle branch block (RBBB)    LAE (left atrial enlargement)    a.) TTE on 11/23/2020 --> moderate   Left thyroid nodule    a.) Cervical MRI on 12/29/2020 --> measured "at least" 3.5 cm; imcompletely imaged.   Long-term use of immunosuppressant medication    a.) takes daily mycophenolate, tacrolimus, prednisone   Murmur    Nephrolithiasis    Osteoporosis    PONV (postoperative nausea and vomiting)    Post-transplant diabetes mellitus ([place])    Renal transplant recipient    a.) living donor transplant from sister on 12/26/1998; rejected organ in 2006 and restarted on hemodialysis. b.) cadaveric  organ recipient on 02/04/2009; located in LEFT lower abdominal quadrant.   Valvular heart disease    a.) TTE 11/23/2020 --> mild panvalvular regurgitation   Past Surgical History:  Procedure Laterality Date   CATARACT EXTRACTION W/PHACO Left 10/21/2020   Procedure: CATARACT EXTRACTION PHACO AND INTRAOCULAR LENS PLACEMENT ([employer]) DIABETES LEFT;  Surgeon: Leandrew Koyanagi, MD;  Location: [place];  Service: Ophthalmology;  Laterality:  Left;  2.84 0:48.8 5.8%   EYE SURGERY Right 2020   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   Living donor organ recipient (sister)   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   Cadaveric organ recipient   No family history on file. Social History   Tobacco Use   Smoking status: Never   Smokeless tobacco: Never  Vaping Use   Vaping Use: Never used  Substance Use Topics   Alcohol use: Not Currently   Drug use: Never    Pertinent Clinical Results:  LABS: Labs reviewed: Acceptable for surgery.  Hospital Outpatient Visit on 03/16/2021  Component Date Value Ref Range Status   aPTT 03/16/2021 30  24 - 36 seconds Final   Performed at [facility], [place], [place] [address]   Sodium 03/16/2021 144  135 - 145 mmol/L Final   Potassium 03/16/2021 3.8  3.5 - 5.1 mmol/L Final   Chloride 03/16/2021 112 (A) 98 - 111 mmol/L Final   CO2 03/16/2021 26  22 - 32 mmol/L Final   Glucose, Bld 03/16/2021 85  70 - 99 mg/dL Final   Glucose reference range applies only to samples taken after fasting for at least 8 hours.   BUN 03/16/2021 19  8 - 23 mg/dL Final   Creatinine, Ser 03/16/2021 1.18 (A) 0.44 - 1.00 mg/dL Final   Calcium 03/16/2021 9.5  8.9 - 10.3 mg/dL Final   GFR, Estimated 03/16/2021 49 (A) >60 mL/min Final   Comment: (NOTE) Calculated using the CKD-EPI Creatinine Equation (2021)    Anion gap 03/16/2021 6  5 - 15 Final   Performed at [facility], [address] [place], [place] [address]   WBC 03/16/2021 6.4  4.0 - 10.5 K/uL Final   RBC 03/16/2021 3.59 (A) 3.87 - 5.11 MIL/uL Final   Hemoglobin 03/16/2021 11.6 (A) 12.0 - 15.0 g/dL Final   HCT 03/16/2021 35.2 (A) 36.0 - 46.0 % Final   MCV 03/16/2021 98.1  80.0 - 100.0 fL Final   MCH 03/16/2021 32.3  26.0 - 34.0 pg Final   MCHC 03/16/2021 33.0  30.0 - 36.0 g/dL Final   RDW 03/16/2021 13.8  11.5 - 15.5 % Final   Platelets 03/16/2021 201  150 - 400 K/uL Final   nRBC 03/16/2021 0.0  0.0 - 0.2 % Final    Performed at [facility], [address] [place], [place] [address]   Prothrombin Time 03/16/2021 14.9  11.4 - 15.2 seconds Final   INR 03/16/2021 1.2  0.8 - 1.2 Final   Comment: (NOTE) INR goal varies based on device and disease states. Performed at [facility], [address] [place], [place] [address]    MRSA, PCR 03/16/2021 NEGATIVE  NEGATIVE Final   Staphylococcus aureus 03/16/2021 NEGATIVE  NEGATIVE Final   Comment: (NOTE) The Xpert SA Assay (FDA approved for NASAL specimens in patients 63 years of age and older), is one component of a comprehensive surveillance program. It is not intended to diagnose infection nor to guide or monitor treatment. Performed at [facility], [address] [place],  [place] [address]    ABO/RH(D) 03/16/2021 A POS   Final   Antibody Screen 03/16/2021 NEG   Final   Sample Expiration 03/16/2021 03/30/2021,2359   Final   Extend sample reason 03/16/2021    Final                   Value:NO TRANSFUSIONS OR PREGNANCY IN THE PAST 3 MONTHS Performed at [facility], [address] [place], [place] [address]     ECG: Date: 03/16/2021 Time ECG obtained: 1229 PM Rate: 66 bpm Rhythm:  Sinus rhythm with marked sinus arrhythmia with first-degree AV block; IRBBB Axis (leads I and aVF): Normal Intervals: PR 256 ms. QRS 92 ms. QTc 465 ms. ST segment and T wave changes: No evidence of acute ST segment elevation or depression.  Evidence of an age undetermined anteroseptal infarct noted. Comparison: Similar to previous tracing obtained on 10/26/2020   IMAGING / PROCEDURES: MRI CERVICAL SPINE WITHOUT CONTRAST performed on 12/29/2020 Advanced cervical and upper thoracic spondylosis. At C3-C4, there is grade 1 anterolisthesis. 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 T2 hyperintense signal abnormality within the spinal cord at this level, which may reflect focal edema and/or myelomalacia.  Bilateral neural foraminal narrowing (moderate right, severe left). At C5-C6, a posterior disc osteophyte complex contributes to severe spinal canal stenosis (greater on the right) with spinal cord flattening. T2 hyperintense signal abnormality within the right aspect of the spinal cord at this level compatible with myelomalacia and/or focal edema. Bilateral neural foraminal narrowing (severe right, moderate/severe left). At C7-T1, a posterior disc osteophyte complex contributes to mild/moderate spinal canal stenosis, contacting and minimally flattening the ventral spinal cord. Bilateral neural foraminal narrowing (moderate/severe right, severe left). No more than mild spinal canal stenosis at the remaining levels. Reversal of the expected cervical lordosis. Grade 1 anterolisthesis also present at C4-C5. Incompletely imaged left thyroid nodule measuring at least 3.5 cm. A dedicated thyroid ultrasound is recommended for further evaluation.  TRANSTHORACIC ECHOCARDIOGRAM performed on 11/23/2020 LVEF >55% Normal left ventricular systolic function with mild LVH Normal right ventricular systolic function Mild pan valvular regurgitation mild aortic stenosis; mean gradient 9.7 mmHg; AVA (VTI) = 1.33 cm clear Small/mild pericardial effusion  Impression and Plan:  Jean Johnson has been referred for pre-anesthesia review and clearance prior to her undergoing the planned anesthetic and procedural courses. Available labs, pertinent testing, and imaging results were personally reviewed by me. This patient has been appropriately cleared by cardiology (ACCEPTABLE) and nephrology (LOW) with the individually indicated risk of significant perioperative complications.  Based on clinical review performed today (03/18/21), barring any significant acute changes in the patient's overall condition, it is anticipated that she will be able to proceed with the planned surgical intervention. Any acute changes in clinical  condition may necessitate her procedure being postponed and/or cancelled. Patient will meet with anesthesia team (MD and/or CRNA) on the day of her procedure for preoperative evaluation/assessment. Questions regarding anesthetic course will be fielded at that time.   Pre-surgical instructions were reviewed with the patient during her PAT appointment and questions were fielded by PAT clinical staff. Patient was advised that if any questions or concerns arise prior to her procedure then she should return a call to PAT and/or her surgeon's office to discuss.  Honor Loh, MSN, APRN, FNP-C, CEN [facility]  Peri-operative Services Nurse Practitioner Phone: [phone] Fax: [phone] 03/18/21 4:37 PM  NOTE: This note has been prepared using Dragon dictation software.  Despite my best ability to proofread, there is always the potential that unintentional transcriptional errors may still occur from this process.

## 2021-03-17 NOTE — Therapy (Addendum)
[facility] [address]. [place], [place], [address] Phone: [phone]   Fax:  [phone]  Physical Therapy Treatment  Patient Details  Name: Jean Johnson MRN: [id number] Date of Birth: 10-10-1950 Referring Provider (PT): Dr. Vladimir Crofts   Encounter Date: 03/17/2021   PT End of Session - 03/17/21 1454     Visit Number 11    Number of Visits 17    Date for PT Re-Evaluation 03/23/21    Authorization Time Period initial eval 01/28/21, progress note 03/02/21    Progress Note Due on Visit 27    PT Start Time 1337    PT Stop Time 1425    PT Time Calculation (min) 48 min    Equipment Utilized During Treatment Gait belt    Activity Tolerance Patient tolerated treatment well    Behavior During Therapy [facility] for tasks assessed/performed             Past Medical History:  Diagnosis Date   Anemia of chronic renal disease    Aortic stenosis, mild    a.) TTE on 11/23/2020 --> mean gradient 9.7 mmHg   Atrial fibrillation ([place])    a.) CHA2DS2-VASc = 3 (age, sex, diabetes). b.) daily apixaban   B12 deficiency    Cervical spinal stenosis    Chronic anticoagulation    Apixaban   Diabetic neuropathy (HCC)    Diverticulosis    Dyspnea    ESRD (end stage renal disease) (HCC)    First degree AV block    HLD (hyperlipidemia)    Hypertension    Incomplete right bundle branch block (RBBB)    LAE (left atrial enlargement)    a.) TTE on 11/23/2020 --> moderate   Left thyroid nodule    a.) Cervical MRI on 12/29/2020 --> measured "at least" 3.5 cm; imcompletely imaged.   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   Valvular heart disease    a.) TTE 11/23/2020 --> mild panvalvular regurgitation    Past Surgical History:  Procedure Laterality Date   CATARACT EXTRACTION W/PHACO Left 10/21/2020   Procedure: CATARACT EXTRACTION PHACO AND INTRAOCULAR LENS PLACEMENT ([place]) DIABETES LEFT;  Surgeon: Leandrew Koyanagi, MD;  Location: [facility];  Service: Ophthalmology;  Laterality: Left;  2.84 0:48.8 5.8%   EYE SURGERY Right 2020   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   Living donor organ recipient (sister)   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   Cadaveric organ recipient    Vitals:   03/17/21 1402 03/17/21 1403  BP: (!) 164/62 (!) 162/58     Subjective Assessment - 03/17/21 1509     Subjective Patient reports 5/10 dizziness at arrival to PT. She reports feeling relatively tired this afternoon. She denies recent near-fall or fall episode. Pt reports some lightheadedness with inititaion of ambulation today. Pt does have some worry about upcoming procedure; she has upcoming ACDF 03/24/21.    Pertinent History Patient is a 70 year old female with primary complaint of dizziness and imbalance. She states this got worse around early 2021 (month unspecified). Patient reports feeling off balance.  She reports intermittently feeling some sensation of her head spinning.  She reports dizziness can last throughout the day. She states this is happening every day. Pt reports no change in sypmtoms since onset. She states it feels different than previous bout of vertigo. She states she uses her cane to move about the house. She reports some difficulty with direction of gait - "I'm trying to walk this way, but walk the other way." Patient has hx of chronic kidney disease, bilateral neuropathy affecting LEs and R hand. She has mild numbness affecting her L hand. She feels that Lyrica helps "a little." Patient reports no  facial numbness or paresthesias. . Patient denies N&V with dizziness. She reports no notable dysarhtria, some dysphagia with food "not wanting to go down." Patient reports limitation in household cleaning, getting up stairs to access her bedroom (handrail is on left going up). She reports more difficulty with LLE going down steps. She reports using single-point cane for household mobility, walker for going outside. She uses wheelchair for shopping if scooter is unavailable. Patient reports one fall in last 6 months - falling out of chair. Patient reports no auditory complaints. Pt is s/p kidney transplant x 2 and she has prolapsed uterus; she reports some bloating and increased urgency, though this has not changed in the previous year notably. Pt denies syncope or drop attacks. Patient has been assessed for mild neck pain in association with neuropathic pain and dizziness with Dr. Jennings Books - consult sent to neurosurgery with concern for possible myelopathy.    Limitations Walking;House hold activities;Standing    Diagnostic tests Impression from radiology as follows:    At C3-C4, there is grade 1 anterolisthesis. 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 T2 hyperintense signal abnormality within the spinal cord at this level, which may reflect focal edema and/or myelomalacia. Bilateral neural foraminal   narrowing (moderate right, severe left).     At C5-C6, a posterior disc osteophyte complex contributes to severe   spinal canal stenosis (greater on the right) with spinal cord flattening. T2 hyperintense signal abnormality within the right   aspect of the spinal cord at this level compatible with myelomalacia and/or focal edema. Bilateral neural foraminal narrowing (severe   right, moderate/severe left).     At C7-T1, a posterior disc osteophyte complex contributes to mild/moderate spinal canal stenosis, contacting and minimally flattening the ventral spinal cord. Bilateral  neural foraminal   narrowing (moderate/severe right, severe left).  No more than mild spinal canal stenosis at the remaining levels. Additional sites of neural foraminal narrowing (including additional   sites of severe neural foraminal narrowing), as detailed. Reversal of the expected cervical lordosis. Grade 1 anterolisthesis also present at C4-C5.    Patient Stated Goals Improvement in dizziness and ability to get around home                TREATMENT      Therapeutic Activities - repetitive task practice and performance of functional ADLs to improve activity performance, gait training   Blood pressure monitoring and discussion on present signs/symptoms. Educated patient on following up with MD if experiencing recurrent episodes of postural hypotension.    In // bars: Sit to stand, from standard chair; 2x10   Ambulate laps in gym with CGA using gait belt; x3  Step over targets, blue agility ladder (stepping over horizontal lines, step-to); x4 down/back     *not today* Forward/retro stepping; x4 D/B with cueing for symmetrical toe-out (avoiding excessive R  toe-out) and no cross-over gait Sidestepping; x3 D/B Standing heel raise/toe raise; 2x10 alternating Adjustment of single-point cane for appropriate height and gait with SPC x 1 lap in gym         Neuromuscular Re-education - for occulomotor re-training, VOR training, postural stability and balance re-training     In // bars: VOR x 1 in standing; 1x30; horizontal and vertical, with narrow BOS  Standing with eyes closed, narrow BOS, on Airex; 1x10 sec, 1x15 sec  Standing with eyes closed, feet apart, on Airex; 1x10 sec, 1x20 sec  Standing with eyes closed, narrow BOS, noncompliant surface; 1x10 sec, 1x15 sec     Standing adjacent to wheelchair with Fransisco Beau string anchored to door: [employer]; x20 down/back, 3 balls on string, anchored on bathroom door       *not today* Dynamic march; 2x down/back At base of  stairs:  Toe Tap; to 6-inch step; 2x10 alternating  Quarter Tandem, eyes closed; 2 x 10 sec, bilateral Rhomberg eyes closed; 1x30sec  Rhomberg eyes open on foam; 2x30sec Semitandem eyes closed attempted, unable to maintain postural stability with frequent lean to L Smooth pursuits with visual target manipulated by therapist; horiz and vertical, in sitting; 2x20     *not today* MHP (unbilled) utilized during pre-treatment for analgesic effect and improved soft tissue extensibility, x 5 minutes along low back with patient seated        ASSESSMENT Patient has ongoing deficits with gait instability with decreased stance time on LLE with intermittent crossover pattern during episode of LOB c gait. She exhibits some increasing difficulty with maintaining static balance without visual input. Pt demonstrates safe transferring from wheelchair. She has no notable increase in symptoms with convergence work today, but she does have some mild increase in symptoms with VOR horizontal (less than VOR vertical). She does have some report of lightheadedness with initiation of ambulation in gym today; took BP with systolic blood pressure in hypertensive range (though not increased to level of hypertensive emergency) and relatively low diastolic blood pressure. Pt was monitored throughout the session and did not have recurrent episode of lightheadedness with standing. Discussed with patient following up with her physician if she is continuing to experience episodes of debilitating lightheadedness with sit to stand or initiation of standing activity. She has remaining deficits in gaze stability, continuous dizziness, postural stability, gait instability, impaired sensory integration/visual reliance for balance, L>R lower extremity weakness, and difficulty with sit to stand. Patient will benefit from continued skilled therapeutic intervention to address the above deficits as needed for improved function and QoL.         PT Short Term Goals - 03/03/21 1336       PT SHORT TERM GOAL #1   Title Pt will be independent with HEP in order to improve strength and balance in order to decrease fall risk and improve function at home and work.    Baseline 02/04/21: HEP initiated at second follow-up visit. 03/02/21: Pt is compliant and independent with HEP    Time 3    Period Weeks    Status Achieved    Target Date 02/18/21      PT SHORT TERM GOAL #2   Title Patient will perform independent stand pivot transfer without loss of balance or cueing required from clinician as needed for safe home-level mobility ability to get to her appointments    Baseline 01/28/21: LOB and high guard with significant postural sway during transfer W/C to table. 03/02/21: Patient is able  to perform transfer independently with IND management of chair and no LOB    Time 4    Period Weeks    Status Achieved    Target Date 02/25/21               PT Long Term Goals - 03/03/21 1337       PT LONG TERM GOAL #1   Title Patient will demonstrate improved function as evidenced by a score of 51 on FOTO measure for full participation in activities at home and in the community.    Baseline 01/28/21: FOTO 41. 03/02/21: FOTO 45    Time 8    Period Weeks    Status New    Target Date 03/25/21      PT LONG TERM GOAL #2   Title Pt will improve BERG by at least 3 points in order to demonstrate clinically significant improvement in balance.    Baseline 01/28/21: BERG score to be obtained at next f/u visit; 02/04/21: BERG 38/56. 03/02/21: BERG 41/56    Time 8    Period Weeks    Status Achieved    Target Date 03/02/21      PT LONG TERM GOAL #3   Title Pt will decrease DHI score by at least 18 points in order to demonstrate clinically significant reduction in disability    Baseline 01/28/21: [place] to be obtained at next follow-up visit; 02/04/21: [place] 19. 03/02/21: [place] 32    Time 8    Period Weeks    Status New    Target Date 03/18/21      PT LONG  TERM GOAL #4   Title Patient will improve TUG to 14 seconds or less indicative of decreased risk of falls and improved ability to perform home-level mobility tasks safely    Baseline 01/28/21: TUG 30 seconds. 03/03/21: TUG 29 seconds    Time 8    Period Weeks    Status New                   Plan - 03/17/21 1501     Clinical Impression Statement Patient has ongoing deficits with gait instability with decreased stance time on LLE with intermittent crossover pattern during episode of LOB c gait. She exhibits some increasing difficulty with maintaining static balance without visual input. Pt demonstrates safe transferring from wheelchair. She has no notable increase in symptoms with convergence work today, but she does have some mild increase in symptoms with VOR horizontal (less than VOR vertical). She does have some report of lightheadedness with initiation of ambulation in gym today; took BP with systolic blood pressure in hypertensive range (though not increased to level of hypertensive emergency) and relatively low diastolic blood pressure. Pt was monitored throughout the session and did not have recurrent episode of lightheadedness with standing. Discussed with patient following up with her physician if she is continuing to experience episodes of debilitating lightheadedness with sit to stand or initiation of standing activity. She has remaining deficits in gaze stability, continuous dizziness, postural stability, gait instability, impaired sensory integration/visual reliance for balance, L>R lower extremity weakness, and difficulty with sit to stand. Patient will benefit from continued skilled therapeutic intervention to address the above deficits as needed for improved function and QoL.    Personal Factors and Comorbidities Age;Comorbidity 3+    Comorbidities chronic kidney disease, Type II DM, peripheral neuropathy, hypertension, Hx of A-fib    Examination-Activity Limitations Bed  Mobility;Stairs;Stand;Locomotion Level;Transfers    Examination-Participation Restrictions [employer]  Activity;Cleaning    Stability/Clinical Decision Making Unstable/Unpredictable    Rehab Potential Good    PT Frequency 2x / week    PT Duration 8 weeks    PT Treatment/Interventions Gait training;Stair training;Functional mobility training;Therapeutic activities;Therapeutic exercise;Balance training;Neuromuscular re-education;Patient/family education    PT Next Visit Plan Continue with occulomotor re-training, VOR training, LE strengthening and postural stability work. Upcoming ACDF scheduled for 03/24/21.    PT Home Exercise Plan Access Code [email or web address]    Consulted and Agree with Plan of Care Patient             Patient will benefit from skilled therapeutic intervention in order to improve the following deficits and impairments:  Abnormal gait, Postural dysfunction, Difficulty walking, Decreased balance, Decreased coordination  Visit Diagnosis: Imbalance  Difficulty in walking, not elsewhere classified  Dizziness and giddiness     Problem List There are no problems to display for this patient.  Valentina Gu, PT, DPT [id number]  Eilleen Kempf, PT 03/17/2021, 3:13 PM  [facility] [address] [place], [place], [address] Phone: [phone]   Fax:  [phone]  Name: Jean Johnson MRN: [id number] Date of Birth: 1951/07/02

## 2021-03-18 ENCOUNTER — Other Ambulatory Visit: Payer: Self-pay

## 2021-03-18 ENCOUNTER — Ambulatory Visit: Payer: Medicare Other | Admitting: Physical Therapy

## 2021-03-18 ENCOUNTER — Encounter: Payer: Self-pay | Admitting: Physical Therapy

## 2021-03-18 DIAGNOSIS — R262 Difficulty in walking, not elsewhere classified: Secondary | ICD-10-CM

## 2021-03-18 DIAGNOSIS — R2689 Other abnormalities of gait and mobility: Secondary | ICD-10-CM

## 2021-03-18 DIAGNOSIS — R42 Dizziness and giddiness: Secondary | ICD-10-CM

## 2021-03-18 NOTE — Therapy (Signed)
[facility] [place] [address] [place], [place], [address] Phone: [phone]   Fax:  [phone]  Physical Therapy Treatment  Patient Details  Name: Jean Johnson MRN: [id number] Date of Birth: 01/12/1951 Referring Provider (PT): Dr. Vladimir Crofts   Encounter Date: 03/18/2021   PT End of Session - 03/19/21 0956     Visit Number 12    Number of Visits 17    Date for PT Re-Evaluation 03/23/21    Authorization Time Period initial eval 01/28/21, progress note 03/02/21    Progress Note Due on Visit 27    PT Start Time 0952    PT Stop Time 1035    PT Time Calculation (min) 43 min    Equipment Utilized During Treatment Gait belt    Activity Tolerance Patient tolerated treatment well    Behavior During Therapy [facility] for tasks assessed/performed             Past Medical History:  Diagnosis Date   Anemia of chronic renal disease    Aortic stenosis, mild    a.) TTE on 11/23/2020 --> mean gradient 9.7 mmHg   Atrial fibrillation ([place])    a.) CHA2DS2-VASc = 3 (age, sex, diabetes). b.) daily apixaban   B12 deficiency    Cervical spinal stenosis    Chronic anticoagulation    Apixaban   Diabetic neuropathy (HCC)    Diverticulosis    Dyspnea    ESRD (end stage renal disease) (HCC)    First degree AV block    HLD (hyperlipidemia)    Hypertension    Incomplete right bundle branch block (RBBB)    LAE (left atrial enlargement)    a.) TTE on 11/23/2020 --> moderate   Left thyroid nodule    a.) Cervical MRI on 12/29/2020 --> measured "at least" 3.5 cm; imcompletely imaged.   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   Subjective Assessment - 03/19/21 0955     Subjective Patient reports 2/10 dizziness at arrival to PT. She denies recent significant near-fall or fall incident. She believes that some of her symptoms may be attributed to her medications. She reports feeling uneasy about upcoming ACDF procedure. She reports minimal pain at arrival to PT this AM.    Pertinent History Patient is a 70 year old female with primary complaint of dizziness and imbalance. She states this got worse around early 2021 (month unspecified). Patient reports feeling off balance. She reports intermittently feeling  some sensation of her head spinning.  She reports dizziness can last throughout the day. She states this is happening every day. Pt reports no change in sypmtoms since onset. She states it feels different than previous bout of vertigo. She states she uses her cane to move about the house. She reports some difficulty with direction of gait - "I'm trying to walk this way, but walk the other way." Patient has hx of chronic kidney disease, bilateral neuropathy affecting LEs and R hand. She has mild numbness affecting her L hand. She feels that Lyrica helps "a little." Patient reports no facial numbness or paresthesias.  . Patient denies N&V with dizziness. She reports no notable dysarhtria, some dysphagia with food "not wanting to go down." Patient reports limitation in household cleaning, getting up stairs to access her bedroom (handrail is on left going up). She reports more difficulty with LLE going down steps. She reports using single-point cane for household mobility, walker for going outside. She uses wheelchair for shopping if scooter is unavailable. Patient reports one fall in last 6 months - falling out of chair. Patient reports no auditory complaints. Pt is s/p kidney transplant x 2 and she has prolapsed uterus; she reports some bloating and increased urgency, though this has not changed in the previous year notably. Pt denies syncope or drop attacks. Patient has been assessed for mild neck pain in association with neuropathic pain and dizziness with Dr. Jennings Books - consult sent to neurosurgery with concern for possible myelopathy.    Limitations Walking;House hold activities;Standing    Diagnostic tests Impression from radiology as follows:    At C3-C4, there is grade 1 anterolisthesis. 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 T2 hyperintense signal abnormality within the spinal cord at this level, which may reflect focal edema and/or myelomalacia. Bilateral neural foraminal   narrowing (moderate right, severe left).     At C5-C6, a posterior disc osteophyte complex contributes to severe   spinal canal stenosis (greater on the right) with spinal cord flattening. T2 hyperintense signal abnormality within the right   aspect of the spinal cord at this level compatible with myelomalacia and/or focal edema. Bilateral neural foraminal narrowing (severe   right, moderate/severe left).     At C7-T1, a posterior disc osteophyte complex contributes to mild/moderate spinal canal stenosis, contacting and minimally flattening the ventral spinal cord. Bilateral neural foraminal   narrowing  (moderate/severe right, severe left).  No more than mild spinal canal stenosis at the remaining levels. Additional sites of neural foraminal narrowing (including additional   sites of severe neural foraminal narrowing), as detailed. Reversal of the expected cervical lordosis. Grade 1 anterolisthesis also present at C4-C5.    Patient Stated Goals Improvement in dizziness and ability to get around home                 TREATMENT      Therapeutic Activities - repetitive task practice and performance of functional ADLs to improve activity performance, gait training   In // bars: Minisquat with bilat UE light touch; 2x10   Ambulate laps in gym with CGA using gait belt; x3   Step over targets, blue agility ladder (stepping over horizontal lines, step-to); x4 down/back       *not today* Sit to stand, from standard chair; 2x10 Forward/retro stepping; x4 D/B with cueing for symmetrical toe-out (avoiding excessive R toe-out) and no cross-over gait Sidestepping; x3 D/B Standing heel raise/toe raise; 2x10 alternating Adjustment of single-point cane  for appropriate height and gait with SPC x 1 lap in gym         Neuromuscular Re-education - for occulomotor re-training, VOR training, postural stability and balance re-training     In // bars: VOR x 1 in standing; 1x30; horizontal and vertical, with narrow BOS   Standing with eyes closed, narrow BOS, on Airex; 1x10 sec, 1x20 sec   Standing with eyes closed, feet apart, on Airex; 2x20sec   Standing with eyes closed, narrow BOS, noncompliant surface; 1x20sec, 1x30sec      Adjacent to wheelchair for UE support as needed: Smooth pursuit in standing; with sticky note visual target (Letter "e"); x20 vertical and horizontal          *not today* Standing adjacent to wheelchair with Fransisco Beau string anchored to door: [employer]; x20 down/back, 3 balls on string, anchored on bathroom door Dynamic march; 2x down/back At base of stairs:   Toe Tap; to 6-inch step; 2x10 alternating  Quarter Tandem, eyes closed; 2 x 10 sec, bilateral Rhomberg eyes closed; 1x30sec  Rhomberg eyes open on foam; 2x30sec Semitandem eyes closed attempted, unable to maintain postural stability with frequent lean to L Smooth pursuits with visual target manipulated by therapist; horiz and vertical, in sitting; 2x20       *not today* MHP (unbilled) utilized during pre-treatment for analgesic effect and improved soft tissue extensibility, x 5 minutes along low back with patient seated          ASSESSMENT Patient exhibits improving ability to negotiate visual targets on floor and improving ability to complete transfers and navigate small space/room without AD. She has mild dizziness today and feels that her symptoms have been intermittent recently. She has ongoing LLE weakness and decreased stance time on left lower limb during gait. Pt has upcoming ACDF next week. Will complete further re-assessment next week to determine her current progress prior to pt moving forward with surgery. She has remaining deficits in gaze stability, continuous dizziness, postural stability, gait instability, impaired sensory integration/visual reliance for balance, L>R lower extremity weakness, and difficulty with sit to stand. Patient will benefit from continued skilled therapeutic intervention to address the above deficits as needed for improved function and QoL.           PT Short Term Goals - 03/03/21 1336       PT SHORT TERM GOAL #1   Title Pt will be independent with HEP in order to improve strength and balance in order to decrease fall risk and improve function at home and work.    Baseline 02/04/21: HEP initiated at second follow-up visit. 03/02/21: Pt is compliant and independent with HEP    Time 3    Period Weeks    Status Achieved    Target Date 02/18/21      PT SHORT TERM GOAL #2   Title Patient will perform independent stand pivot transfer without loss of  balance or cueing required from clinician as needed for safe home-level mobility ability to get to her appointments    Baseline 01/28/21: LOB and high guard with significant postural sway during transfer W/C to table. 03/02/21: Patient is able to perform transfer independently with IND management of chair and no LOB    Time 4    Period Weeks    Status Achieved    Target Date 02/25/21               PT Long Term Goals - 03/03/21 1337  PT LONG TERM GOAL #1   Title Patient will demonstrate improved function as evidenced by a score of 51 on FOTO measure for full participation in activities at home and in the community.    Baseline 01/28/21: FOTO 41. 03/02/21: FOTO 45    Time 8    Period Weeks    Status New    Target Date 03/25/21      PT LONG TERM GOAL #2   Title Pt will improve BERG by at least 3 points in order to demonstrate clinically significant improvement in balance.    Baseline 01/28/21: BERG score to be obtained at next f/u visit; 02/04/21: BERG 38/56. 03/02/21: BERG 41/56    Time 8    Period Weeks    Status Achieved    Target Date 03/02/21      PT LONG TERM GOAL #3   Title Pt will decrease DHI score by at least 18 points in order to demonstrate clinically significant reduction in disability    Baseline 01/28/21: [place] to be obtained at next follow-up visit; 02/04/21: [place] 19. 03/02/21: [place] 32    Time 8    Period Weeks    Status New    Target Date 03/18/21      PT LONG TERM GOAL #4   Title Patient will improve TUG to 14 seconds or less indicative of decreased risk of falls and improved ability to perform home-level mobility tasks safely    Baseline 01/28/21: TUG 30 seconds. 03/03/21: TUG 29 seconds    Time 8    Period Weeks    Status New                   Plan - 03/19/21 1015     Clinical Impression Statement Patient exhibits improving ability to negotiate visual targets on floor and improving ability to complete transfers and navigate small space/room without  AD. She has mild dizziness today and feels that her symptoms have been intermittent recently. She has ongoing LLE weakness and decreased stance time on left lower limb during gait. Pt has upcoming ACDF next week. Will complete further re-assessment next week to determine her current progress prior to pt moving forward with surgery. She has remaining deficits in gaze stability, continuous dizziness, postural stability, gait instability, impaired sensory integration/visual reliance for balance, L>R lower extremity weakness, and difficulty with sit to stand. Patient will benefit from continued skilled therapeutic intervention to address the above deficits as needed for improved function and QoL.    Personal Factors and Comorbidities Age;Comorbidity 3+    Comorbidities chronic kidney disease, Type II DM, peripheral neuropathy, hypertension, Hx of A-fib    Examination-Activity Limitations Bed Mobility;Stairs;Stand;Locomotion Level;Transfers    Examination-Participation Restrictions Community Activity;Cleaning    Stability/Clinical Decision Making Unstable/Unpredictable    Rehab Potential Good    PT Frequency 2x / week    PT Duration 8 weeks    PT Treatment/Interventions Gait training;Stair training;Functional mobility training;Therapeutic activities;Therapeutic exercise;Balance training;Neuromuscular re-education;Patient/family education    PT Next Visit Plan Continue with occulomotor re-training, VOR training, LE strengthening and postural stability work. Upcoming ACDF scheduled for 03/24/21.    PT Home Exercise Plan Access Code [email or web address]    Consulted and Agree with Plan of Care Patient             Patient will benefit from skilled therapeutic intervention in order to improve the following deficits and impairments:  Abnormal gait, Postural dysfunction, Difficulty walking, Decreased balance, Decreased coordination  Visit Diagnosis: Imbalance  Difficulty  in walking, not elsewhere  classified  Dizziness and giddiness     Problem List There are no problems to display for this patient.  Valentina Gu, PT, DPT [id number]  Eilleen Kempf, PT 03/19/2021, 10:17 AM  [place] [facility] [address] [place], [place], [address] Phone: [phone]   Fax:  [phone]  Name: Jean Johnson MRN: [id number] Date of Birth: 1951-07-05

## 2021-03-22 ENCOUNTER — Other Ambulatory Visit: Payer: Self-pay

## 2021-03-22 ENCOUNTER — Other Ambulatory Visit
Admission: RE | Admit: 2021-03-22 | Discharge: 2021-03-22 | Disposition: A | Discharge status: Home / Self Care | Payer: Medicare Other | Source: Ambulatory Visit | Attending: Neurosurgery | Admitting: Neurosurgery

## 2021-03-22 DIAGNOSIS — Z01812 Encounter for preprocedural laboratory examination: Secondary | ICD-10-CM | POA: Insufficient documentation

## 2021-03-22 DIAGNOSIS — Z20822 Contact with and (suspected) exposure to covid-19: Secondary | ICD-10-CM | POA: Insufficient documentation

## 2021-03-22 LAB — SARS CORONAVIRUS 2 (TAT 6-24 HRS): SARS Coronavirus 2: NEGATIVE

## 2021-03-23 ENCOUNTER — Encounter: Payer: 59 | Admitting: Physical Therapy

## 2021-03-24 ENCOUNTER — Inpatient Hospital Stay: Payer: Medicare Other | Admitting: Urgent Care

## 2021-03-24 ENCOUNTER — Other Ambulatory Visit: Payer: Self-pay

## 2021-03-24 ENCOUNTER — Encounter
Admission: RE | Disposition: A | Discharge status: Home / Self Care | Payer: Self-pay | Source: Ambulatory Visit | Attending: Neurosurgery

## 2021-03-24 ENCOUNTER — Inpatient Hospital Stay
Admission: RE | Admit: 2021-03-24 | Discharge: 2021-03-27 | DRG: 472 | Disposition: A | Discharge status: Home Health | Payer: Medicare Other | Attending: Neurosurgery | Admitting: Neurosurgery

## 2021-03-24 ENCOUNTER — Inpatient Hospital Stay: Payer: Medicare Other

## 2021-03-24 ENCOUNTER — Encounter: Payer: Self-pay | Admitting: Neurosurgery

## 2021-03-24 DIAGNOSIS — D84821 Immunodeficiency due to drugs: Secondary | ICD-10-CM | POA: Diagnosis present

## 2021-03-24 DIAGNOSIS — M4712 Other spondylosis with myelopathy, cervical region: Secondary | ICD-10-CM | POA: Diagnosis present

## 2021-03-24 DIAGNOSIS — R5383 Other fatigue: Secondary | ICD-10-CM | POA: Diagnosis present

## 2021-03-24 DIAGNOSIS — M4714 Other spondylosis with myelopathy, thoracic region: Secondary | ICD-10-CM | POA: Diagnosis present

## 2021-03-24 DIAGNOSIS — E785 Hyperlipidemia, unspecified: Secondary | ICD-10-CM | POA: Diagnosis present

## 2021-03-24 DIAGNOSIS — Z20822 Contact with and (suspected) exposure to covid-19: Secondary | ICD-10-CM | POA: Diagnosis present

## 2021-03-24 DIAGNOSIS — E1140 Type 2 diabetes mellitus with diabetic neuropathy, unspecified: Secondary | ICD-10-CM | POA: Diagnosis present

## 2021-03-24 DIAGNOSIS — I350 Nonrheumatic aortic (valve) stenosis: Secondary | ICD-10-CM | POA: Diagnosis present

## 2021-03-24 DIAGNOSIS — M4312 Spondylolisthesis, cervical region: Secondary | ICD-10-CM | POA: Diagnosis present

## 2021-03-24 DIAGNOSIS — I440 Atrioventricular block, first degree: Secondary | ICD-10-CM | POA: Diagnosis present

## 2021-03-24 DIAGNOSIS — M2578 Osteophyte, vertebrae: Secondary | ICD-10-CM | POA: Diagnosis present

## 2021-03-24 DIAGNOSIS — Z794 Long term (current) use of insulin: Secondary | ICD-10-CM

## 2021-03-24 DIAGNOSIS — Z7901 Long term (current) use of anticoagulants: Secondary | ICD-10-CM | POA: Diagnosis not present

## 2021-03-24 DIAGNOSIS — Z881 Allergy status to other antibiotic agents status: Secondary | ICD-10-CM | POA: Diagnosis not present

## 2021-03-24 DIAGNOSIS — Z8249 Family history of ischemic heart disease and other diseases of the circulatory system: Secondary | ICD-10-CM

## 2021-03-24 DIAGNOSIS — M4803 Spinal stenosis, cervicothoracic region: Secondary | ICD-10-CM | POA: Diagnosis present

## 2021-03-24 DIAGNOSIS — I10 Essential (primary) hypertension: Secondary | ICD-10-CM | POA: Diagnosis present

## 2021-03-24 DIAGNOSIS — G959 Disease of spinal cord, unspecified: Secondary | ICD-10-CM | POA: Diagnosis not present

## 2021-03-24 DIAGNOSIS — E041 Nontoxic single thyroid nodule: Secondary | ICD-10-CM | POA: Diagnosis present

## 2021-03-24 DIAGNOSIS — Z833 Family history of diabetes mellitus: Secondary | ICD-10-CM

## 2021-03-24 DIAGNOSIS — I480 Paroxysmal atrial fibrillation: Secondary | ICD-10-CM | POA: Diagnosis present

## 2021-03-24 DIAGNOSIS — M4722 Other spondylosis with radiculopathy, cervical region: Secondary | ICD-10-CM | POA: Diagnosis present

## 2021-03-24 DIAGNOSIS — M4802 Spinal stenosis, cervical region: Secondary | ICD-10-CM | POA: Diagnosis present

## 2021-03-24 DIAGNOSIS — Z880 Allergy status to penicillin: Secondary | ICD-10-CM | POA: Diagnosis not present

## 2021-03-24 DIAGNOSIS — Z79899 Other long term (current) drug therapy: Secondary | ICD-10-CM | POA: Diagnosis not present

## 2021-03-24 DIAGNOSIS — Z940 Kidney transplant status: Secondary | ICD-10-CM

## 2021-03-24 DIAGNOSIS — E1169 Type 2 diabetes mellitus with other specified complication: Secondary | ICD-10-CM | POA: Diagnosis present

## 2021-03-24 DIAGNOSIS — R1310 Dysphagia, unspecified: Secondary | ICD-10-CM | POA: Diagnosis present

## 2021-03-24 DIAGNOSIS — Z7952 Long term (current) use of systemic steroids: Secondary | ICD-10-CM

## 2021-03-24 DIAGNOSIS — Z87891 Personal history of nicotine dependence: Secondary | ICD-10-CM

## 2021-03-24 DIAGNOSIS — Z419 Encounter for procedure for purposes other than remedying health state, unspecified: Secondary | ICD-10-CM

## 2021-03-24 HISTORY — DX: Long term (current) use of unspecified immunomodulators and immunosuppressants: Z79.60

## 2021-03-24 HISTORY — DX: Unspecified right bundle-branch block: I45.10

## 2021-03-24 HISTORY — DX: Hyperlipidemia, unspecified: E78.5

## 2021-03-24 HISTORY — DX: Deficiency of other specified B group vitamins: E53.8

## 2021-03-24 HISTORY — DX: Age-related osteoporosis without current pathological fracture: M81.0

## 2021-03-24 HISTORY — DX: Diverticulosis of intestine, part unspecified, without perforation or abscess without bleeding: K57.90

## 2021-03-24 HISTORY — PX: ANTERIOR CERVICAL DECOMP/DISCECTOMY FUSION: SHX1161

## 2021-03-24 HISTORY — DX: Cardiac murmur, unspecified: R01.1

## 2021-03-24 HISTORY — DX: End stage renal disease: N18.6

## 2021-03-24 HISTORY — DX: Atrioventricular block, first degree: I44.0

## 2021-03-24 HISTORY — DX: Spinal stenosis, cervical region: M48.02

## 2021-03-24 HISTORY — DX: Long term (current) use of anticoagulants: Z79.01

## 2021-03-24 HISTORY — DX: Other specified diabetes mellitus without complications: E13.9

## 2021-03-24 HISTORY — DX: Unspecified atrial fibrillation: I48.91

## 2021-03-24 HISTORY — DX: Endocarditis, valve unspecified: I38

## 2021-03-24 HISTORY — DX: Type 2 diabetes mellitus with diabetic neuropathy, unspecified: E11.40

## 2021-03-24 HISTORY — DX: Kidney transplant status: Z94.0

## 2021-03-24 HISTORY — DX: Nonrheumatic aortic (valve) stenosis: I35.0

## 2021-03-24 HISTORY — DX: Calculus of kidney: N20.0

## 2021-03-24 HISTORY — DX: Anemia in other chronic diseases classified elsewhere: D63.8

## 2021-03-24 HISTORY — DX: Cardiomegaly: I51.7

## 2021-03-24 HISTORY — DX: Other long term (current) drug therapy: Z79.899

## 2021-03-24 HISTORY — DX: Nontoxic single thyroid nodule: E04.1

## 2021-03-24 LAB — HEMOGLOBIN A1C
Hgb A1c MFr Bld: 5.5 % (ref 4.8–5.6)
Mean Plasma Glucose: 111.15 mg/dL

## 2021-03-24 LAB — GLUCOSE, CAPILLARY
Glucose-Capillary: 88 mg/dL (ref 70–99)
Glucose-Capillary: 105 mg/dL — ABNORMAL HIGH (ref 70–99)
Glucose-Capillary: 153 mg/dL — ABNORMAL HIGH (ref 70–99)
Glucose-Capillary: 219 mg/dL — ABNORMAL HIGH (ref 70–99)
Glucose-Capillary: 174 mg/dL — ABNORMAL HIGH (ref 70–99)

## 2021-03-24 LAB — ABO/RH: ABO/RH(D): A POS

## 2021-03-24 SURGERY — ANTERIOR CERVICAL DECOMPRESSION/DISCECTOMY FUSION 3 LEVELS
Anesthesia: General

## 2021-03-24 MED ORDER — INSULIN ASPART 100 UNIT/ML IJ SOLN
0.0000 [IU] | Freq: Every day | INTRAMUSCULAR | Status: DC
  Administered 2021-03-26: 3 [IU] via SUBCUTANEOUS
  Filled 2021-03-24: qty 1

## 2021-03-24 MED ORDER — REMIFENTANIL HCL 1 MG IV SOLR
INTRAVENOUS | Status: AC
  Filled 2021-03-24: qty 1000

## 2021-03-24 MED ORDER — APREPITANT 40 MG PO CAPS
ORAL_CAPSULE | ORAL | Status: AC
  Filled 2021-03-24: qty 1

## 2021-03-24 MED ORDER — ACETAMINOPHEN 325 MG PO TABS
650.0000 mg | ORAL_TABLET | Freq: Four times a day (QID) | ORAL | Status: DC | PRN
  Filled 2021-03-24: qty 2

## 2021-03-24 MED ORDER — FLECAINIDE ACETATE 100 MG PO TABS
100.0000 mg | ORAL_TABLET | Freq: Two times a day (BID) | ORAL | Status: DC
  Administered 2021-03-24 – 2021-03-27 (×6): 100 mg via ORAL
  Filled 2021-03-24 (×7): qty 1

## 2021-03-24 MED ORDER — CHLORHEXIDINE GLUCONATE 0.12 % MT SOLN: 15.0000 mL | Freq: Once | OROMUCOSAL | Status: AC

## 2021-03-24 MED ORDER — HYDRALAZINE HCL 20 MG/ML IJ SOLN
5.0000 mg | Freq: Four times a day (QID) | INTRAMUSCULAR | Status: DC | PRN
  Administered 2021-03-26: 5 mg via INTRAVENOUS
  Filled 2021-03-24: qty 1
  Filled 2021-03-24: qty 0.25

## 2021-03-24 MED ORDER — FAMOTIDINE 20 MG PO TABS: 20.0000 mg | ORAL_TABLET | Freq: Once | ORAL | Status: AC

## 2021-03-24 MED ORDER — OXYCODONE HCL 5 MG/5ML PO SOLN
5.0000 mg | Freq: Once | ORAL | Status: DC | PRN
Start: 2021-03-24 — End: 2021-03-24

## 2021-03-24 MED ORDER — MELATONIN 5 MG PO TABS
2.5000 mg | ORAL_TABLET | Freq: Every evening | ORAL | Status: DC | PRN
  Administered 2021-03-24: 2.5 mg via ORAL
  Filled 2021-03-24: qty 0.5
  Filled 2021-03-24: qty 1

## 2021-03-24 MED ORDER — INSULIN ASPART 100 UNIT/ML IJ SOLN: 0.0000 [IU] | Freq: Three times a day (TID) | INTRAMUSCULAR | Status: DC

## 2021-03-24 MED ORDER — APREPITANT 40 MG PO CAPS
40.0000 mg | ORAL_CAPSULE | Freq: Once | ORAL | Status: AC
  Administered 2021-03-24: 40 mg via ORAL

## 2021-03-24 MED ORDER — POTASSIUM CHLORIDE CRYS ER 20 MEQ PO TBCR
20.0000 meq | EXTENDED_RELEASE_TABLET | Freq: Three times a day (TID) | ORAL | Status: DC
  Administered 2021-03-24 – 2021-03-27 (×9): 20 meq via ORAL
  Filled 2021-03-24 (×9): qty 1

## 2021-03-24 MED ORDER — PHENOL 1.4 % MT LIQD
1.0000 | OROMUCOSAL | Status: DC | PRN
  Filled 2021-03-24: qty 177

## 2021-03-24 MED ORDER — ACETAMINOPHEN 325 MG PO TABS
650.0000 mg | ORAL_TABLET | ORAL | Status: DC | PRN
  Administered 2021-03-25: 650 mg via ORAL
  Filled 2021-03-24 (×3): qty 2

## 2021-03-24 MED ORDER — FLEET ENEMA 7-19 GM/118ML RE ENEM: 1.0000 | ENEMA | Freq: Once | RECTAL | Status: DC | PRN

## 2021-03-24 MED ORDER — ACETAMINOPHEN 10 MG/ML IV SOLN
INTRAVENOUS | Status: DC | PRN
  Administered 2021-03-24: 1000 mg via INTRAVENOUS

## 2021-03-24 MED ORDER — PROPOFOL 1000 MG/100ML IV EMUL
INTRAVENOUS | Status: AC
  Filled 2021-03-24: qty 200

## 2021-03-24 MED ORDER — SODIUM CHLORIDE 0.9% FLUSH: 3.0000 mL | INTRAVENOUS | Status: DC | PRN

## 2021-03-24 MED ORDER — BUPIVACAINE-EPINEPHRINE 0.5% -1:200000 IJ SOLN
INTRAMUSCULAR | Status: DC | PRN
  Administered 2021-03-24: 6 mL

## 2021-03-24 MED ORDER — OXYCODONE HCL 5 MG PO TABS: 5.0000 mg | ORAL_TABLET | Freq: Once | ORAL | Status: DC | PRN

## 2021-03-24 MED ORDER — INSULIN ASPART 100 UNIT/ML IJ SOLN
0.0000 [IU] | Freq: Three times a day (TID) | INTRAMUSCULAR | Status: DC
  Administered 2021-03-24: 2 [IU] via SUBCUTANEOUS
  Administered 2021-03-25 – 2021-03-26 (×3): 1 [IU] via SUBCUTANEOUS
  Filled 2021-03-24 (×4): qty 1

## 2021-03-24 MED ORDER — CEFAZOLIN SODIUM-DEXTROSE 2-4 GM/100ML-% IV SOLN
2.0000 g | Freq: Once | INTRAVENOUS | Status: AC
  Administered 2021-03-24 (×2): 2 g via INTRAVENOUS

## 2021-03-24 MED ORDER — SUCCINYLCHOLINE CHLORIDE 200 MG/10ML IV SOSY
PREFILLED_SYRINGE | INTRAVENOUS | Status: DC | PRN
  Administered 2021-03-24: 80 mg via INTRAVENOUS

## 2021-03-24 MED ORDER — INSULIN DETEMIR 100 UNIT/ML ~~LOC~~ SOLN
8.0000 [IU] | Freq: Every day | SUBCUTANEOUS | Status: DC
  Administered 2021-03-24 – 2021-03-26 (×3): 8 [IU] via SUBCUTANEOUS
  Filled 2021-03-24 (×4): qty 0.08

## 2021-03-24 MED ORDER — LACTATED RINGERS IV SOLN: INTRAVENOUS | Status: AC

## 2021-03-24 MED ORDER — ONDANSETRON HCL 4 MG/2ML IJ SOLN: 4.0000 mg | Freq: Four times a day (QID) | INTRAMUSCULAR | Status: DC | PRN

## 2021-03-24 MED ORDER — ATORVASTATIN CALCIUM 10 MG PO TABS
10.0000 mg | ORAL_TABLET | Freq: Every day | ORAL | Status: DC
  Administered 2021-03-25 – 2021-03-27 (×3): 10 mg via ORAL
  Filled 2021-03-24 (×4): qty 1

## 2021-03-24 MED ORDER — SENNA 8.6 MG PO TABS
1.0000 | ORAL_TABLET | Freq: Two times a day (BID) | ORAL | Status: DC
  Administered 2021-03-24 – 2021-03-27 (×6): 8.6 mg via ORAL
  Filled 2021-03-24 (×6): qty 1

## 2021-03-24 MED ORDER — METHOCARBAMOL 1000 MG/10ML IJ SOLN
500.0000 mg | Freq: Four times a day (QID) | INTRAVENOUS | Status: DC | PRN
  Filled 2021-03-24: qty 5

## 2021-03-24 MED ORDER — POLYETHYLENE GLYCOL 3350 17 G PO PACK
17.0000 g | PACK | Freq: Every day | ORAL | Status: DC | PRN
Start: 2021-03-24 — End: 2021-03-27

## 2021-03-24 MED ORDER — MENTHOL 3 MG MT LOZG
1.0000 | LOZENGE | OROMUCOSAL | Status: DC | PRN
  Administered 2021-03-25: 3 mg via ORAL
  Filled 2021-03-24 (×2): qty 9

## 2021-03-24 MED ORDER — FENTANYL CITRATE (PF) 100 MCG/2ML IJ SOLN: 25.0000 ug | INTRAMUSCULAR | Status: DC | PRN

## 2021-03-24 MED ORDER — PROPOFOL 10 MG/ML IV BOLUS
INTRAVENOUS | Status: DC | PRN
  Administered 2021-03-24: 80 mg via INTRAVENOUS

## 2021-03-24 MED ORDER — ONDANSETRON HCL 4 MG/2ML IJ SOLN
INTRAMUSCULAR | Status: DC | PRN
  Administered 2021-03-24: 4 mg via INTRAVENOUS

## 2021-03-24 MED ORDER — METHOCARBAMOL 500 MG PO TABS
500.0000 mg | ORAL_TABLET | Freq: Four times a day (QID) | ORAL | Status: DC | PRN
  Administered 2021-03-24 – 2021-03-26 (×2): 500 mg via ORAL
  Filled 2021-03-24 (×2): qty 1

## 2021-03-24 MED ORDER — MYCOPHENOLATE MOFETIL 250 MG PO CAPS: 500.0000 mg | ORAL_CAPSULE | Freq: Two times a day (BID) | ORAL | Status: DC

## 2021-03-24 MED ORDER — MIDAZOLAM HCL 2 MG/2ML IJ SOLN
INTRAMUSCULAR | Status: AC
  Filled 2021-03-24: qty 2

## 2021-03-24 MED ORDER — PHENYLEPHRINE HCL (PRESSORS) 10 MG/ML IV SOLN
INTRAVENOUS | Status: AC
  Filled 2021-03-24: qty 1

## 2021-03-24 MED ORDER — VITAMIN B-12 1000 MCG PO TABS
2000.0000 ug | ORAL_TABLET | Freq: Every day | ORAL | Status: DC
  Administered 2021-03-24 – 2021-03-27 (×4): 2000 ug via ORAL
  Filled 2021-03-24 (×4): qty 2

## 2021-03-24 MED ORDER — LIDOCAINE HCL (CARDIAC) PF 100 MG/5ML IV SOSY
PREFILLED_SYRINGE | INTRAVENOUS | Status: DC | PRN
Start: 2021-03-24 — End: 2021-03-24
  Administered 2021-03-24: 100 mg via INTRAVENOUS
  Administered 2021-03-24: 50 mg via INTRAVENOUS

## 2021-03-24 MED ORDER — FENTANYL CITRATE (PF) 100 MCG/2ML IJ SOLN
INTRAMUSCULAR | Status: DC | PRN
  Administered 2021-03-24: 100 ug via INTRAVENOUS

## 2021-03-24 MED ORDER — SODIUM CHLORIDE 0.9 % IV SOLN: INTRAVENOUS | Status: DC

## 2021-03-24 MED ORDER — OXYCODONE HCL 5 MG PO TABS
5.0000 mg | ORAL_TABLET | ORAL | Status: DC | PRN
  Administered 2021-03-26: 5 mg via ORAL
  Filled 2021-03-24: qty 1

## 2021-03-24 MED ORDER — HEMOSTATIC AGENTS (NO CHARGE) OPTIME
TOPICAL | Status: DC | PRN
Start: 2021-03-24 — End: 2021-03-24
  Administered 2021-03-24 (×2): 1 via TOPICAL

## 2021-03-24 MED ORDER — ACETAMINOPHEN 10 MG/ML IV SOLN
INTRAVENOUS | Status: AC
  Filled 2021-03-24: qty 100

## 2021-03-24 MED ORDER — EZETIMIBE 10 MG PO TABS
10.0000 mg | ORAL_TABLET | Freq: Every day | ORAL | Status: DC
  Administered 2021-03-25 – 2021-03-27 (×3): 10 mg via ORAL
  Filled 2021-03-24 (×3): qty 1

## 2021-03-24 MED ORDER — TACROLIMUS 1 MG PO CAPS
3.0000 mg | ORAL_CAPSULE | Freq: Two times a day (BID) | ORAL | Status: DC
  Administered 2021-03-24 – 2021-03-27 (×6): 3 mg via ORAL
  Filled 2021-03-24 (×8): qty 3

## 2021-03-24 MED ORDER — PREGABALIN 50 MG PO CAPS
50.0000 mg | ORAL_CAPSULE | Freq: Two times a day (BID) | ORAL | Status: DC
  Administered 2021-03-24 – 2021-03-27 (×6): 50 mg via ORAL
  Filled 2021-03-24 (×6): qty 1

## 2021-03-24 MED ORDER — PROPOFOL 1000 MG/100ML IV EMUL
INTRAVENOUS | Status: AC
  Filled 2021-03-24: qty 100

## 2021-03-24 MED ORDER — MEPERIDINE HCL 25 MG/ML IJ SOLN: 6.2500 mg | INTRAMUSCULAR | Status: DC | PRN

## 2021-03-24 MED ORDER — OXYCODONE HCL 5 MG PO TABS
10.0000 mg | ORAL_TABLET | ORAL | Status: DC | PRN
  Administered 2021-03-24: 10 mg via ORAL
  Filled 2021-03-24: qty 2

## 2021-03-24 MED ORDER — DEXAMETHASONE SODIUM PHOSPHATE 10 MG/ML IJ SOLN
INTRAMUSCULAR | Status: DC | PRN
  Administered 2021-03-24: 10 mg via INTRAVENOUS

## 2021-03-24 MED ORDER — PROPOFOL 500 MG/50ML IV EMUL
INTRAVENOUS | Status: DC | PRN
  Administered 2021-03-24: 125 ug/kg/min via INTRAVENOUS

## 2021-03-24 MED ORDER — 0.9 % SODIUM CHLORIDE (POUR BTL) OPTIME
TOPICAL | Status: DC | PRN
  Administered 2021-03-24: 1000 mL

## 2021-03-24 MED ORDER — CEFAZOLIN SODIUM-DEXTROSE 2-4 GM/100ML-% IV SOLN
INTRAVENOUS | Status: AC
  Filled 2021-03-24: qty 100

## 2021-03-24 MED ORDER — CHLORHEXIDINE GLUCONATE 0.12 % MT SOLN
OROMUCOSAL | Status: AC
  Administered 2021-03-24: 15 mL via OROMUCOSAL
  Filled 2021-03-24: qty 15

## 2021-03-24 MED ORDER — GLYCOPYRROLATE 0.2 MG/ML IJ SOLN
INTRAMUSCULAR | Status: DC | PRN
Start: 2021-03-24 — End: 2021-03-24
  Administered 2021-03-24: .2 mg via INTRAVENOUS

## 2021-03-24 MED ORDER — FAMOTIDINE 20 MG PO TABS
ORAL_TABLET | ORAL | Status: AC
  Administered 2021-03-24: 20 mg via ORAL
  Filled 2021-03-24: qty 1

## 2021-03-24 MED ORDER — SODIUM CHLORIDE 0.9% FLUSH
3.0000 mL | Freq: Two times a day (BID) | INTRAVENOUS | Status: DC
  Administered 2021-03-24 – 2021-03-25 (×2): 3 mL via INTRAVENOUS

## 2021-03-24 MED ORDER — PREDNISONE 10 MG PO TABS
5.0000 mg | ORAL_TABLET | Freq: Every day | ORAL | Status: DC
  Administered 2021-03-25 – 2021-03-27 (×3): 5 mg via ORAL
  Filled 2021-03-24 (×3): qty 1

## 2021-03-24 MED ORDER — ONDANSETRON HCL 4 MG PO TABS: 4.0000 mg | ORAL_TABLET | Freq: Four times a day (QID) | ORAL | Status: DC | PRN

## 2021-03-24 MED ORDER — THROMBIN 5000 UNITS EX SOLR
CUTANEOUS | Status: DC | PRN
  Administered 2021-03-24 (×2): 5000 [IU] via TOPICAL

## 2021-03-24 MED ORDER — SODIUM CHLORIDE 0.9 % IV SOLN: 250.0000 mL | INTRAVENOUS | Status: DC

## 2021-03-24 MED ORDER — ORAL CARE MOUTH RINSE: 15.0000 mL | Freq: Once | OROMUCOSAL | Status: AC

## 2021-03-24 MED ORDER — FENTANYL CITRATE (PF) 100 MCG/2ML IJ SOLN
INTRAMUSCULAR | Status: AC
  Filled 2021-03-24: qty 2

## 2021-03-24 MED ORDER — DILTIAZEM HCL ER COATED BEADS 240 MG PO CP24
240.0000 mg | ORAL_CAPSULE | Freq: Every day | ORAL | Status: DC
  Administered 2021-03-24 – 2021-03-26 (×3): 240 mg via ORAL
  Filled 2021-03-24 (×3): qty 1

## 2021-03-24 MED ORDER — REMIFENTANIL HCL 1 MG IV SOLR
INTRAVENOUS | Status: DC | PRN
  Administered 2021-03-24: .1 ug/kg/min via INTRAVENOUS

## 2021-03-24 MED ORDER — LISINOPRIL 20 MG PO TABS
30.0000 mg | ORAL_TABLET | Freq: Every day | ORAL | Status: DC
  Administered 2021-03-24 – 2021-03-27 (×4): 30 mg via ORAL
  Filled 2021-03-24 (×5): qty 1

## 2021-03-24 MED ORDER — SODIUM CHLORIDE 0.9 % IV SOLN
INTRAVENOUS | Status: DC | PRN
  Administered 2021-03-24: 15 ug/min via INTRAVENOUS

## 2021-03-24 MED ORDER — ONDANSETRON HCL 4 MG/2ML IJ SOLN: 4.0000 mg | Freq: Once | INTRAMUSCULAR | Status: DC | PRN

## 2021-03-24 MED ORDER — ACETAMINOPHEN 650 MG RE SUPP: 650.0000 mg | RECTAL | Status: DC | PRN

## 2021-03-24 MED ORDER — BISACODYL 5 MG PO TBEC
5.0000 mg | DELAYED_RELEASE_TABLET | Freq: Every day | ORAL | Status: DC | PRN
  Administered 2021-03-27: 5 mg via ORAL
  Filled 2021-03-24: qty 1

## 2021-03-24 MED ORDER — EPHEDRINE SULFATE 50 MG/ML IJ SOLN
INTRAMUSCULAR | Status: DC | PRN
  Administered 2021-03-24: 5 mg via INTRAVENOUS

## 2021-03-24 MED ORDER — INSULIN ASPART 100 UNIT/ML IJ SOLN: 0.0000 [IU] | Freq: Every day | INTRAMUSCULAR | Status: DC

## 2021-03-24 SURGICAL SUPPLY — 62 items
BULB RESERV EVAC DRAIN JP 100C (MISCELLANEOUS) ×2 IMPLANT
BUR NEURO DRILL SOFT 3.0X3.8M (BURR) ×2 IMPLANT
CHLORAPREP W/TINT 26 (MISCELLANEOUS) ×2 IMPLANT
COUNTER NEEDLE 20/40 LG (NEEDLE) ×2 IMPLANT
DERMABOND ADVANCED (GAUZE/BANDAGES/DRESSINGS) ×1
DERMABOND ADVANCED .7 DNX12 (GAUZE/BANDAGES/DRESSINGS) ×1 IMPLANT
DRAIN CHANNEL JP 10F RND 20C F (MISCELLANEOUS) ×2 IMPLANT
DRAPE C ARM PK CFD 31 SPINE (DRAPES) ×2 IMPLANT
DRAPE LAPAROTOMY 77X122 PED (DRAPES) ×2 IMPLANT
DRAPE MICROSCOPE SPINE 48X150 (DRAPES) ×2 IMPLANT
DRAPE SURG 17X11 SM STRL (DRAPES) ×8 IMPLANT
DRSG TEGADERM 2-3/8X2-3/4 SM (GAUZE/BANDAGES/DRESSINGS) ×2 IMPLANT
ELECT CAUTERY BLADE TIP 2.5 (TIP) ×2
ELECTRODE CAUTERY BLDE TIP 2.5 (TIP) ×1 IMPLANT
FEE INTRAOP CADWELL SUPPLY NCS (MISCELLANEOUS) ×1 IMPLANT
FEE INTRAOP MONITOR IMPULS NCS (MISCELLANEOUS) ×1 IMPLANT
GAUZE 4X4 16PLY ~~LOC~~+RFID DBL (SPONGE) ×2 IMPLANT
GAUZE SPONGE 4X4 12PLY STRL (GAUZE/BANDAGES/DRESSINGS) ×2 IMPLANT
GLOVE SURG SYN 6.5 ES PF (GLOVE) ×4 IMPLANT
GLOVE SURG SYN 8.5  E (GLOVE) ×6
GLOVE SURG SYN 8.5 E (GLOVE) ×3 IMPLANT
GLOVE SURG UNDER POLY LF SZ6.5 (GLOVE) ×4 IMPLANT
GOWN SRG LRG LVL 4 IMPRV REINF (GOWNS) ×2 IMPLANT
GOWN SRG XL LVL 3 NONREINFORCE (GOWNS) ×1 IMPLANT
GOWN STRL NON-REIN TWL XL LVL3 (GOWNS) ×2
GOWN STRL REIN LRG LVL4 (GOWNS) ×4
GRADUATE 1200CC STRL 31836 (MISCELLANEOUS) ×2 IMPLANT
INTRAOP CADWELL SUPPLY FEE NCS (MISCELLANEOUS) ×1
INTRAOP DISP SUPPLY FEE NCS (MISCELLANEOUS) ×2
INTRAOP MONITOR FEE IMPULS NCS (MISCELLANEOUS) ×1
INTRAOP MONITOR FEE IMPULSE (MISCELLANEOUS) ×2
KIT TURNOVER KIT A (KITS) ×2 IMPLANT
MANIFOLD NEPTUNE II (INSTRUMENTS) ×2 IMPLANT
MARKER SKIN DUAL TIP RULER LAB (MISCELLANEOUS) ×4 IMPLANT
NEEDLE HYPO 22GX1.5 SAFETY (NEEDLE) ×2 IMPLANT
NS IRRIG 1000ML POUR BTL (IV SOLUTION) ×2 IMPLANT
PACK LAMINECTOMY NEURO (CUSTOM PROCEDURE TRAY) ×2 IMPLANT
PAD ARMBOARD 7.5X6 YLW CONV (MISCELLANEOUS) ×4 IMPLANT
PIN CASPAR 14 (PIN) ×1 IMPLANT
PIN CASPAR 14MM (PIN) ×2
PLATE ANT CERV XTEND 3 LV 42 (Plate) ×2 IMPLANT
PUTTY DBX 1CC (Putty) ×4 IMPLANT
PUTTY DBX 1CC DEPUY (Putty) ×2 IMPLANT
SCREW VAR 4.2 XD SELF DRILL 16 (Screw) ×14 IMPLANT
SCREW XTEND SELF DRILL 4.6X16 (Screw) ×2 IMPLANT
SPACER C HEDRON 12X14 6 7D (Spacer) ×4 IMPLANT
SPACER C HEDRON 12X14 7M 7D (Spacer) ×2 IMPLANT
SPOGE SURGIFLO 8M (HEMOSTASIS) ×2
SPONGE KITTNER 5P (MISCELLANEOUS) ×4 IMPLANT
SPONGE SURGIFLO 8M (HEMOSTASIS) ×2 IMPLANT
STAPLER SKIN PROX 35W (STAPLE) ×2 IMPLANT
STRIP CLOSURE SKIN 1/2X4 (GAUZE/BANDAGES/DRESSINGS) IMPLANT
SUT ETHILON 3-0 FS-10 30 BLK (SUTURE) ×2
SUT V-LOC 90 ABS DVC 3-0 CL (SUTURE) ×2 IMPLANT
SUT VIC AB 3-0 SH 8-18 (SUTURE) ×2 IMPLANT
SUTURE EHLN 3-0 FS-10 30 BLK (SUTURE) ×1 IMPLANT
SYR 30ML LL (SYRINGE) ×2 IMPLANT
TAPE CLOTH 3X10 WHT NS LF (GAUZE/BANDAGES/DRESSINGS) ×2 IMPLANT
TOWEL OR 17X26 4PK STRL BLUE (TOWEL DISPOSABLE) ×8 IMPLANT
TRAY FOLEY MTR SLVR 16FR STAT (SET/KITS/TRAYS/PACK) IMPLANT
TUBING CONNECTING 10 (TUBING) ×2 IMPLANT
WATER STERILE IRR 500ML POUR (IV SOLUTION) ×2 IMPLANT

## 2021-03-24 NOTE — Op Note (Signed)
Indications: Ms. Leella Yanni is suffering from cervical myelopathy. Due to weakness we recommended surgical intervention.  Findings: severe stenosis C3-6  Preoperative Diagnosis: Cervical myelopathy G95.9 Postoperative Diagnosis: same   EBL: 50 ml IVF: 1000 ml Drains: 1 placed Disposition: Extubated and Stable to PACU Complications: none  No foley catheter was placed.   Preoperative Note:   Risks of surgery discussed include: infection, bleeding, stroke, coma, death, paralysis, CSF leak, nerve/spinal cord injury, numbness, tingling, weakness, complex regional pain syndrome, recurrent stenosis and/or disc herniation, vascular injury, development of instability, neck/back pain, need for further surgery, persistent symptoms, development of deformity, and the risks of anesthesia. The patient understood these risks and agreed to proceed.  Operative Note:  Operative Procedure: 1. Anterior Cervical Discectomy and Fusion C4-5 including bilateral foraminotomies and end plate preparation  2. Anterior Cervical Discectomy and Fusion C5-6 including bilateral foraminotomies and end plate preparation  3. Anterior Cervical Discectomy and Fusion C3-4 including bilateral foraminotomies and end plate preparation 4. Anterior Spinal Instrumentation C3 to 6 using Globus Xtend 5. Anterior arthrodesis from C3 to C6 with placement of biomechanical devices at C3-4, C4-5, and C5-6 6. Use of the operative microscope 7. Use of intraoperative flouroscopy  PROCEDURE IN DETAIL: After obtaining informed consent, the patient taken to the operating room, placed in supine position, general anesthesia induced.  The patient had a small shoulder roll placed behind their shoulders.  The patient received preop antibiotics and IV Decadron.  The patient had a neck incision outlined, was prepped and draped in usual sterile fashion. The incision was injected with local anesthetic.   An incision was opened, dissection taken down  medial to the carotid artery and jugular vein, lateral to the trachea and esophagus.  The prevertebral fascia identified and a localizing x-ray demonstrated the correct level.  The longus colli were dissected laterally, and self-retaining retractors placed to open the operative field. The microscope was then brought into the field.  With this complete, distractor pins were placed in the vertebral bodies of C3 and C4. The distractor was placed from C3-4, and the annulus at C3-4 was opened using a bovie.  Curettes and pituitary rongeurs used to remove the majority of disk, then the drill was used to remove the posterior osteophyte and begin the foraminotomies. The nerve hook was used to elevate the posterior longitudinal ligament, which was then removed with Kerrison rongeurs. The microblunt nerve hook could be passed out the foramen bilaterally.   Meticulous hemostasis was obtained.    A trial was used to size the disc space. A biomechanical device (Globus Hedron C 7 mm height x 14 mm width x 12 mm depth) was filled with bone-promoting allograft and tapped behind the anterior lip of the vertebral body at C3/4.    The distractor was removed, and the C3 distractor pin removed. Bone wax was used for hemostasis.  A caspar pin was placed at C6, then the distractor placed at C4-6. The annulus C4-5 was opened using a bovie.  Curettes and pituitary rongeurs used to remove the majority of disk, then the drill was used to remove the posterior osteophyte and begin the foraminotomies. The nerve hook was used to elevate the posterior longitudinal ligament, which was then removed with Kerrison rongeurs. The microblunt nerve hook could be passed out the foramen bilaterally.   Meticulous hemostasis was obtained.    A trial was used to size the disc space. A biomechanical device (Globus Hedron C 7 mm height x 14 mm width  x 12 mm depth) was filled with bone-promoting allograft and tapped behind the anterior lip of the  vertebral body at C4/5.    The annulus C5-6 was opened using a bovie.  Curettes and pituitary rongeurs used to remove the majority of disk, then the drill was used to remove the posterior osteophyte and begin the foraminotomies. The nerve hook was used to elevate the posterior longitudinal ligament, which was then removed with Kerrison rongeurs. The microblunt nerve hook could be passed out the foramen bilaterally.   Meticulous hemostasis was obtained.    A trial was used to size the disc space. A biomechanical device (Globus Hedron C 7 mm height x 14 mm width x 12 mm depth) was filled with bone-promoting allograft and tapped behind the anterior lip of the vertebral body at C5/6.   The caspar distractor was removed, and bone wax used for hemostasis at each level. The anterior osteophytes were removed.   A separate, four segment, three level plate (42 mm Globus Xtend) was chosen.  Two screws placed in the vertebral bodies of all four segments, respectively making sure the screws were behind the locking mechanism.  Final AP and lateral radiographs were taken.   With everything in good position, the wound was irrigated copiously with bacitracin-containing solution and meticulous hemostasis obtained.  Wound was closed in 2 layers using interrupted inverted 3-0 Vicryl sutures in the platysma and 3-0 monocryl in the dermis.  The wound was dressed with dermabond, the head of bed at 30 degrees, taken to recovery room in stable condition.  No new postop neurological deficits were identified.  All counts were correct at the end of the case.   Monitoring was used throughout without any changes.  Cooper Render PA assisted in the entire procedure.

## 2021-03-24 NOTE — Anesthesia Preprocedure Evaluation (Addendum)
Anesthesia Evaluation  Patient identified by MRN, date of birth, ID band Patient awake    Reviewed: Allergy & Precautions, NPO status , Patient's Chart, lab work & pertinent test results, reviewed documented beta blocker date and time   Airway Mallampati: I  TM Distance: >3 FB Neck ROM: Full    Dental no notable dental hx.    Pulmonary    Pulmonary exam normal        Cardiovascular Exercise Tolerance: Poor hypertension, Pt. on medications Normal cardiovascular examI     Neuro/Psych    GI/Hepatic   Endo/Other  diabetes, Well Controlled  Renal/GU Renal disease     Musculoskeletal   Abdominal Normal abdominal exam  (+)   Peds  Hematology   Anesthesia Other Findings H/o kidney transplant approx. 10 years back  Reproductive/Obstetrics                            Anesthesia Physical Anesthesia Plan  ASA: 2  Anesthesia Plan: General   Post-op Pain Management:    Induction:   PONV Risk Score and Plan:   Airway Management Planned: Oral ETT  Additional Equipment: None and Arterial line  Intra-op Plan:   Post-operative Plan:   Informed Consent: I have reviewed the patients History and Physical, chart, labs and discussed the procedure including the risks, benefits and alternatives for the proposed anesthesia with the patient or authorized representative who has indicated his/her understanding and acceptance.       Plan Discussed with: CRNA  Anesthesia Plan Comments:        Anesthesia Quick Evaluation

## 2021-03-24 NOTE — Anesthesia Procedure Notes (Signed)
Procedure Name: Intubation Date/Time: 03/24/2021 10:10 AM Performed by: Kelton Pillar, CRNA Pre-anesthesia Checklist: Patient identified, Emergency Drugs available, Suction available and Patient being monitored Patient Re-evaluated:Patient Re-evaluated prior to induction Oxygen Delivery Method: Circle system utilized Preoxygenation: Pre-oxygenation with 100% oxygen Induction Type: IV induction Ventilation: Mask ventilation without difficulty Laryngoscope Size: McGraph and 3 Grade View: Grade I Tube type: Oral Tube size: 6.5 mm Number of attempts: 1 Airway Equipment and Method: Stylet and Oral airway Placement Confirmation: ETT inserted through vocal cords under direct vision, positive ETCO2, breath sounds checked- equal and bilateral and CO2 detector Secured at: 21 cm Tube secured with: Tape Dental Injury: Teeth and Oropharynx as per pre-operative assessment

## 2021-03-24 NOTE — Consult Note (Signed)
Pharmacy Antibiotic Note  Jean Johnson is a 70 y.o. female admitted on 03/24/2021 with surgical prophylaxis.    Pharmacy has been consulted for cefazolin dosing.  Pt with reported PCN allergy of swelling but without reported cephalosporin allergy and med history of cefuroxime use from 04/2020.  Plan: Will dose Cefazolin 2g x 1 within 60 minutes of surgical incision - please contact pharmacy if surgery is prolonged and additional dosing is warranted.  Height: '5\' 1"'$  (154.9 cm) Weight: 53 kg (116 lb 14.4 oz) IBW/kg (Calculated) : 47.8  Temp (24hrs), Avg:97.5 F (36.4 C), Min:97.5 F (36.4 C), Max:97.5 F (36.4 C)   Allergies  Allergen Reactions   E-Mycin [Erythromycin] Swelling    And itching    Penicillins Swelling    Throat     Thank you for allowing pharmacy to be a part of this patient's care.  Lu Duffel, PharmD, BCPS Clinical Pharmacist 03/24/2021 8:35 AM

## 2021-03-24 NOTE — Progress Notes (Signed)
Orthopedic Tech Progress Note Patient Details:  Jean Johnson 10/19/1950 [id number]  Called in order to [facility] for an [place] Patient ID: Jean Johnson, female   DOB: 01-Dec-1950, 70 y.o.   MRN: [id number]  Janit Pagan 03/24/2021, 3:53 PM

## 2021-03-24 NOTE — H&P (Signed)
History of Present Illness: 03/24/2021 Jean Johnson has continued symptoms as noted below.  02/18/2021 Jean Johnson is here today with a chief complaint of balance issues, numbness in her right hand, tingling in fingertips, dropping items, trouble with buttons, and changes in her handwriting. She also reports burning in her right leg. She is a diabetic and has a fistula in her left arm, but reports she is no longer on dialysis.  She has been having trouble for a few years both gradually worsening numbness and tingling in her hands. She is having trouble walking and now cannot walk unaided. She is lost strength in her arms.   Bowel/Bladder Dysfunction: none  Conservative measures:  Physical therapy: Currently participating in balance PT at [facility] in [facility] Multimodal medical therapy including regular antiinflammatories: gabapentin, lyrica, tylenol, prednsione Injections: has not had any epidural steroid injections  Past Surgery: denies  Jean Johnson has clear and progressive symptoms of cervical myelopathy.  The symptoms are causing a significant impact on the patient's life.   Review of Systems:  A 10 point review of systems is negative, except for the pertinent positives and negatives detailed in the HPI.  Past Medical History: Past Medical History:  Diagnosis Date   Anemia of chronic renal failure 09/21/2018   Atrial fibrillation (CMS-HCC) 09/21/2018   B12 deficiency   Diabetes mellitus with neuropathy (CMS-HCC)   ESRD (end stage renal disease) (CMS-HCC)  Etiology uncertain, status post renal transplant   Hyperlipidemia associated with type 2 diabetes mellitus (CMS-HCC) 10/12/2020   Hypertension associated with type 2 diabetes mellitus (CMS-HCC) 10/12/2020   Nephrolithiasis 05/2010   Osteoporosis 09/21/2018   Post-transplant diabetes mellitus (CMS-HCC) 2012   Pyelonephritis 05/01/2020   Spinal stenosis of cervical region 09/21/2018  Was advised to have surgery  but declined   Unilateral renal agenesis   Zoster  2 separate episodes in the 1990s   Past Surgical History: Past Surgical History:  Procedure Laterality Date   CATARACT EXTRACTION Left 2022   CREATION ARTERIOVENOUS FISTULA Left   ENDOSCOPIC CARPAL TUNNEL RELEASE Right 2016   EXTRACTION CATARACT EXTRACAPSULAR W/INSERTION INTRAOCULAR PROSTHESIS Right 03/2020   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   Artificial Tear Ointment (DRY EYES OP) Place 1 drop into both eyes daily as needed (blurry Vision or floater in eyes).   atorvastatin (LIPITOR) 10 MG tablet Take 10 mg by mouth daily.   cyanocobalamin 2000 MCG tablet Take 2,000 mcg by mouth daily.   diltiazem (CARDIZEM CD) 240 MG 24 hr capsule Take 240 mg by mouth at bedtime.   ezetimibe (ZETIA) 10 MG tablet Take 10 mg by mouth daily.   flecainide (TAMBOCOR) 100 MG tablet Take 100 mg by mouth every 12 (twelve) hours.   insulin detemir (LEVEMIR) 100 UNIT/ML injection Inject 8 Units into the skin at bedtime.   insulin lispro (HUMALOG) 100 UNIT/ML KwikPen Inject 0-5 Units into the skin in the morning and at bedtime. Sliding scale   lisinopril (ZESTRIL) 30 MG tablet Take 30 mg by mouth daily.   mycophenolate (CELLCEPT) 500 MG tablet Take 500 mg by mouth 2 (two) times daily.   Potassium Chloride ER 20 MEQ TBCR Take 20 mEq by mouth 3 (three) times daily.   predniSONE (DELTASONE) 5 MG tablet Take  5 mg by mouth daily with breakfast.   pregabalin (LYRICA) 25 MG capsule Take 50 mg by mouth 2 (two) times daily.   tacrolimus (PROGRAF) 1 MG capsule Take 3 mg by mouth 2 (two) times daily.     Allergies  Allergen Reactions   E-Mycin [Erythromycin] Swelling    And itching    Penicillins Swelling    Throat     Social History: Social History   Tobacco  Use   Smoking status: Former Smoker  Types: Cigarettes  Quit date: 1994  Years since quitting: 28.6   Smokeless tobacco: Never Used  Substance Use Topics   Alcohol use: Not Currently  Comment: occ wine   Drug use: Never   Family Medical History: Family History  Problem Relation Age of Onset   Diabetes Father   Coronary Artery Disease (Blocked arteries around heart) Father  S/P CABG   High blood pressure (Hypertension) Father   Diabetes Sister   Cerebral aneurysm Sister   No Known Problems Maternal Grandmother   No Known Problems Maternal Grandfather   No Known Problems Paternal Grandmother   No Known Problems Paternal Grandfather   Cancer Neg Hx   Breast cancer Neg Hx   Colon cancer Neg Hx   Physical Examination:  Vitals:   03/24/21 0822  BP: (!) 193/68  Pulse: (!) 58  Resp: 14  Temp: (!) 97.5 F (36.4 C)  SpO2: 100%    Heart sounds normal no MRG. Chest Clear to Auscultation Bilaterally.   General: Patient is well developed, well nourished, calm, collected, and in no apparent distress. Attention to examination is appropriate.  Psychiatric: Patient is non-anxious.  Head: Pupils equal, round, and reactive to light.  ENT: Oral mucosa appears well hydrated.  Neck: Supple. Limited extension range of motion.  Respiratory: Patient is breathing without any difficulty.  Extremities: No edema.  Vascular: Palpable dorsal pedal pulses.  Skin: On exposed skin, there are no abnormal skin lesions.  NEUROLOGICAL:   Awake, alert, oriented to person, place, and time. Speech is clear and fluent. Fund of knowledge is appropriate.   Cranial Nerves: Pupils equal round and reactive to light. Facial tone is symmetric. Facial sensation is symmetric. Shoulder shrug is symmetric. Tongue protrusion is midline. There is no pronator drift.  Strength: Side Biceps Triceps Deltoid Interossei Grip Wrist Ext. Wrist Flex.  R 4+ 4+ '4 4 4 4 '$ 4+  L 4+ 4+ 4+ '4 4 4 4   '$ Side Iliopsoas  Quads Hamstring PF DF EHL  R '5 5 5 5 5 5  '$ L '5 5 5 5 5 5   '$ Reflexes are 3+ and symmetric at the biceps, triceps, brachioradialis, patella and achilles. Hoffman's is present.  Clonus is present. Bilateral upper and lower extremity sensation is symmetric to light touch.  Gait is abnormal and wide-based. She cannot walk normally. She cannot perform tandem walk.  No evidence of dysmetria noted.  Medical Decision Making  Imaging: MRI C spine 12/29/2020 C3-C4: Grade 1 anterolisthesis. Disc uncovering with disc bulge and  uncovertebral hypertrophy. Superimposed central disc protrusion.  Facet arthrosis and ligamentum flavum hypertrophy. Severe spinal  canal stenosis with spinal cord flattening. T2/STIR hyperintense  signal abnormality within the spinal cord at this level which may  reflect focal edema and/or myelomalacia (series 7, image 8).  Bilateral neural foraminal narrowing (moderate right, severe left).   C4-C5: Grade 1 anterolisthesis. Disc uncovering with disc bulge.  Endplate spurring with bilateral disc osteophyte ridge/uncinate  hypertrophy. Facet arthrosis. Moderate  spinal canal stenosis without  significant spinal cord mass effect. Bilateral neural foraminal  narrowing (severe right, moderate left   C5-C6: Posterior disc osteophyte complex with right greater than  left disc osteophyte ridge/uncinate hypertrophy. Facet arthrosis.  Severe spinal canal stenosis (greater on the right). With spinal  cord flattening. T2 hyperintense signal abnormality within the right  aspect of the spinal cord which may reflect myelomalacia and/or  focal edema (series 8, image 14). Bilateral neural foraminal  narrowing (severe right, moderate/severe left).   C6-C7: Posterior disc osteophyte complex with left greater than  right disc osteophyte ridge/uncinate hypertrophy. Mild spinal canal  narrowing without significant spinal cord mass effect. Bilateral  neural foraminal narrowing  (moderate/severe right, severe left).   C7-T1: Posterior disc osteophyte complex with bilateral disc  osteophyte ridge. Mild facet arthrosis. Mild/moderate spinal canal  stenosis. Contact upon the ventral spinal cord with mild ventral  spinal cord flattening (series 5, image 9). Bilateral neural  foraminal narrowing (moderate/severe right, severe left).   T1-T2: Posterior disc osteophyte complex with bilateral disc  osteophyte ridge. Mild facet arthrosis. Mild relative spinal canal  narrowing without significant spinal cord mass effect. Moderate  bilateral neural foraminal narrowing.   IMPRESSION:  Advanced cervical and upper thoracic spondylosis, as outlined and  with findings most notably as follows.   At C3-C4, there is grade 1 anterolisthesis. Multifactorial severe  spinal canal stenosis with spinal cord flattening. T2 hyperintense  signal abnormality within the spinal cord at this level, which may  reflect focal edema and/or myelomalacia. Bilateral neural foraminal  narrowing (moderate right, severe left).   At C5-C6, a posterior disc osteophyte complex contributes to severe  spinal canal stenosis (greater on the right) with spinal cord  flattening. T2 hyperintense signal abnormality within the right  aspect of the spinal cord at this level compatible with myelomalacia  and/or focal edema. Bilateral neural foraminal narrowing (severe  right, moderate/severe left).   At C7-T1, a posterior disc osteophyte complex contributes to  mild/moderate spinal canal stenosis, contacting and minimally  flattening the ventral spinal cord. Bilateral neural foraminal  narrowing (moderate/severe right, severe left).   No more than mild spinal canal stenosis at the remaining levels.  Additional sites of neural foraminal narrowing (including additional  sites of severe neural foraminal narrowing), as detailed.   Reversal of the expected cervical lordosis. Grade 1 anterolisthesis  also  present at C4-C5.   Incompletely imaged left thyroid nodule measuring at least 3.5 cm. A  dedicated thyroid ultrasound is recommended for further evaluation.   Electronically Signed    By: Kellie Simmering DO   I have personally reviewed the images and agree with the above interpretation.  Assessment and Plan: Jean Johnson is a pleasant 70 y.o. female with cervical myelopathy with progressive symptoms. She is very weak and very unstable. She has critical stenosis C3-4 and moderate to severe stenosis at C4-5 and C5-6. There is no role for conservative management.   We will proceed with ACDF C3-6   Meade Maw MD, [facility] Department of Neurosurgery

## 2021-03-24 NOTE — Transfer of Care (Signed)
Immediate Anesthesia Transfer of Care Note  Patient: Jean Johnson  Procedure(s) Performed: C3-6 ANTERIOR CERVICAL DECOMPRESSION/DISCECTOMY FUSION 3 LEVELS  Patient Location: PACU  Anesthesia Type:General  Level of Consciousness: awake, drowsy and patient cooperative  Airway & Oxygen Therapy: Patient Spontanous Breathing  Post-op Assessment: Report given to RN and Post -op Vital signs reviewed and stable  Post vital signs: Reviewed and stable  Last Vitals:  Vitals Value Taken Time  BP 174/69 03/24/21 1330  Temp    Pulse 76 03/24/21 1338  Resp 14 03/24/21 1338  SpO2 97 % 03/24/21 1338  Vitals shown include unvalidated device data.  Last Pain:  Vitals:   03/24/21 0822  TempSrc: Temporal  PainSc: 0-No pain         Complications: No notable events documented.

## 2021-03-24 NOTE — Anesthesia Postprocedure Evaluation (Signed)
Anesthesia Post Note  Patient: Jean Johnson  Procedure(s) Performed: C3-6 ANTERIOR CERVICAL DECOMPRESSION/DISCECTOMY FUSION 3 LEVELS  Patient location during evaluation: PACU Anesthesia Type: General Level of consciousness: awake and alert Pain management: pain level controlled Vital Signs Assessment: post-procedure vital signs reviewed and stable Respiratory status: spontaneous breathing, nonlabored ventilation, respiratory function stable and patient connected to nasal cannula oxygen Cardiovascular status: blood pressure returned to baseline and stable Postop Assessment: no apparent nausea or vomiting Anesthetic complications: no   No notable events documented.   Last Vitals:  Vitals:   03/24/21 1345 03/24/21 1346  BP:  (!) 183/67  Pulse: 77 78  Resp: 15 20  Temp:    SpO2: 100% 100%    Last Pain:  Vitals:   03/24/21 0822  TempSrc: Temporal  PainSc: 0-No pain                 Aryahna Spagna Doyne Keel

## 2021-03-24 NOTE — Consult Note (Addendum)
Medical Consultation   Caroljean Pazmino  [id number]  DOB: 22-Nov-1950  DOA: 03/24/2021  PCP: Lyla Son, MD   Outpatient Specialists: Neurosurgery.   Requesting physician: Dr. Izora Ribas  Reason for consultation: Diabetes, medical management.   History of Present Illness: Jean Johnson is an 70 y.o. female with past medical history significant for insulin-dependent type 2 diabetes, diabetic peripheral neuropathy, ESRD status post bilateral kidney transplant on immunosuppressants, anemia of chronic disease, paroxysmal atrial fibrillation on Eliquis, hyperlipidemia, essential hypertension, nephrolithiasis, B12 deficiency, osteoporosis, spinal stenosis of cervical region, who was admitted by the neurosurgical team after undergoing ACDF C3-6 on 03/24/2021 by Dr. Izora Ribas.  Per neurosurgery no surgical complication.  [facility] was called to consult for management of her diabetes and her chronic medical conditions.  She is in the room accompanied by her husband.  She has some mild discomfort in her throat with swallowing, she rates 3 out of 10.  She also reports bilateral lower extremity edema left greater than right.  States she had let her endocrinologist know about this and was told that it was due to her diabetes.  She denies any pain in her lower extremities.  Review of Systems:  ROS As per HPI otherwise 10 point review of systems negative.    Past Medical History: Past Medical History:  Diagnosis Date   Anemia of chronic renal disease    Aortic stenosis, mild    a.) TTE on 11/23/2020 --> mean gradient 9.7 mmHg   Atrial fibrillation (HCC)    a.) CHA2DS2-VASc = 3 (age, sex, diabetes). b.) daily apixaban   B12 deficiency    Cervical spinal stenosis    Chronic anticoagulation    Apixaban   Diabetic neuropathy (HCC)    Diverticulosis    Dyspnea    ESRD (end stage renal disease) (HCC)    First degree AV block    HLD (hyperlipidemia)    Hypertension     Incomplete right bundle branch block (RBBB)    LAE (left atrial enlargement)    a.) TTE on 11/23/2020 --> moderate   Left thyroid nodule    a.) Cervical MRI on 12/29/2020 --> measured "at least" 3.5 cm; imcompletely imaged.   Long-term use of immunosuppressant medication    a.) takes daily mycophenolate, tacrolimus, prednisone   Murmur    Nephrolithiasis    Osteoporosis    PONV (postoperative nausea and vomiting)    Post-transplant diabetes mellitus ([place])    Renal transplant recipient    a.) living donor transplant from sister on 12/26/1998; rejected organ in 2006 and restarted on hemodialysis. 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   Valvular heart disease    a.) TTE 11/23/2020 --> mild panvalvular regurgitation    Past Surgical History: Past Surgical History:  Procedure Laterality Date   CATARACT EXTRACTION W/PHACO Left 10/21/2020   Procedure: CATARACT EXTRACTION PHACO AND INTRAOCULAR LENS PLACEMENT ([place]) DIABETES LEFT;  Surgeon: Leandrew Koyanagi, MD;  Location: [facility];  Service: Ophthalmology;  Laterality: Left;  2.84 0:48.8 5.8%   EYE SURGERY Right 2020   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   Living donor organ recipient (sister)   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   Cadaveric organ recipient     Allergies:   Allergies  Allergen Reactions   E-Mycin [Erythromycin] Swelling    And itching    Penicillins Swelling    Throat  Social History:  reports that she has never smoked. She has never used smokeless tobacco. She reports that she does not currently use alcohol. She reports that she does not use drugs.   Family History: History reviewed. No pertinent family history.   Physical Exam: Vitals:   03/24/21 1328 03/24/21 1330 03/24/21 1345 03/24/21 1346  BP:  (!) 174/69  (!) 183/67  Pulse: 82 78 77 78  Resp:  (!) '21 15 20  '$ Temp: (!) 97.2 F (36.2 C)     TempSrc:      SpO2: 100% 100% 100%  100%  Weight:      Height:        Constitutional: Alert and awake, oriented x3, not in any acute distress. Eyes: PERLA, EOMI, irises appear normal, anicteric sclera,  ENMT: external ears and nose appear normal, normal hearing. Lips appears normal, oropharynx mucosa, tongue, posterior pharynx appear normal  Neck: neck appears normal, no masses, normal ROM, no thyromegaly, no JVD  CVS: S1-S2 clear, mild LE edema, normal pedal pulses  Respiratory:  clear to auscultation bilaterally, no wheezing, rales or rhonchi. Respiratory effort normal. No accessory muscle use.  Abdomen: soft nontender, nondistended, normal bowel sounds, no hepatosplenomegaly, no hernias  Musculoskeletal: : no cyanosis or clubbing.  Mild lower extremity edema bilaterally, left greater than right.  No tenderness with palpation.               Neuro: Cranial nerves II-XII intact, strength, sensation, reflexes Psych: judgement and insight appear normal, stable mood and affect, mental status Skin: no rashes or lesions or ulcers, no induration or nodules   Data reviewed:  I have personally reviewed following labs and imaging studies Labs:  CBC: No results for input(s): WBC, NEUTROABS, HGB, HCT, MCV, PLT in the last 168 hours.  Basic Metabolic Panel: No results for input(s): NA, K, CL, CO2, GLUCOSE, BUN, CREATININE, CALCIUM, MG, PHOS in the last 168 hours. GFR Estimated Creatinine Clearance: 33 mL/min (A) (by C-G formula based on SCr of 1.18 mg/dL (H)). Liver Function Tests: No results for input(s): AST, ALT, ALKPHOS, BILITOT, PROT, ALBUMIN in the last 168 hours. No results for input(s): LIPASE, AMYLASE in the last 168 hours. No results for input(s): AMMONIA in the last 168 hours. Coagulation profile No results for input(s): INR, PROTIME in the last 168 hours.  Cardiac Enzymes: No results for input(s): CKTOTAL, CKMB, CKMBINDEX, TROPONINI in the last 168 hours. BNP: Invalid input(s): POCBNP CBG: Recent Labs  Lab  03/24/21 0826 03/24/21 1328  GLUCAP 88 105*   D-Dimer No results for input(s): DDIMER in the last 72 hours. Hgb A1c No results for input(s): HGBA1C in the last 72 hours. Lipid Profile No results for input(s): CHOL, HDL, LDLCALC, TRIG, CHOLHDL, LDLDIRECT in the last 72 hours. Thyroid function studies No results for input(s): TSH, T4TOTAL, T3FREE, THYROIDAB in the last 72 hours.  Invalid input(s): FREET3 Anemia work up No results for input(s): VITAMINB12, FOLATE, FERRITIN, TIBC, IRON, RETICCTPCT in the last 72 hours. Urinalysis    Component Value Date/Time   APPEARANCEUR Hazy (A) 11/16/2020 1344   GLUCOSEU Negative 11/16/2020 1344   BILIRUBINUR Negative 11/16/2020 1344   PROTEINUR 3+ (A) 11/16/2020 1344   NITRITE Negative 11/16/2020 1344   LEUKOCYTESUR Negative 11/16/2020 1344     Microbiology Recent Results (from the past 240 hour(s))  Surgical pcr screen     Status: None   Collection Time: 03/16/21 12:22 PM   Specimen: Nasal Mucosa; Nasal Swab  Result Value Ref Range  Status   MRSA, PCR NEGATIVE NEGATIVE Final   Staphylococcus aureus NEGATIVE NEGATIVE Final    Comment: (NOTE) The Xpert SA Assay (FDA approved for NASAL specimens in patients 62 years of age and older), is one component of a comprehensive surveillance program. It is not intended to diagnose infection nor to guide or monitor treatment. Performed at [facility], [place], [place] [address]   SARS CORONAVIRUS 2 (TAT 6-24 HRS) Nasopharyngeal Nasopharyngeal Swab     Status: None   Collection Time: 03/22/21  9:54 AM   Specimen: Nasopharyngeal Swab  Result Value Ref Range Status   SARS Coronavirus 2 NEGATIVE NEGATIVE Final    Comment: (NOTE) SARS-CoV-2 target nucleic acids are NOT DETECTED.  The SARS-CoV-2 RNA is generally detectable in upper and lower respiratory specimens during the acute phase of infection. Negative results do not preclude SARS-CoV-2 infection, do not rule  out co-infections with other pathogens, and should not be used as the sole basis for treatment or other patient management decisions. Negative results must be combined with clinical observations, patient history, and epidemiological information. The expected result is Negative.  Fact Sheet for Patients: SugarRoll.be  Fact Sheet for Healthcare Providers: https://www.woods-mathews.com/  This test is not yet approved or cleared by the [place] FDA and  has been authorized for detection and/or diagnosis of SARS-CoV-2 by FDA under an Emergency Use Authorization (EUA). This EUA will remain  in effect (meaning this test can be used) for the duration of the COVID-19 declaration under Se ction 564(b)(1) of the Act, 21 U.S.C. section 360bbb-3(b)(1), unless the authorization is terminated or revoked sooner.  Performed at [facility], [place] [address], [place], [place] [address]        Inpatient Medications:   Scheduled Meds:  aprepitant       Continuous Infusions:  sodium chloride 10 mL/hr at 03/24/21 [id number]     Radiological Exams on Admission: DG Cervical Spine 2 or 3 views  Result Date: 03/24/2021 CLINICAL DATA:  surgery EXAM: CERVICAL SPINE - 2-3 VIEW; DG C-ARM 1-60 MIN COMPARISON:  None. FINDINGS: Fluoro time: 5 seconds. Five C-arm fluoroscopic images were obtained intraoperatively and submitted for post operative interpretation. The initial image demonstrates a probe with its tip at the C3-4 anterior disc space. Subsequent images demonstrate placement of anterior plate and screws spanning C3-C6 with intervening biomechanical devices at C3-C4, C4-C5, and C5-C6. Expected swelling/gas. Please see the performing provider's procedural report for further detail. IMPRESSION: Intraoperative fluoroscopy, as detailed above. Electronically Signed   By: Margaretha Sheffield M.D.   On: 03/24/2021 13:23   DG C-Arm 1-60 Min  Result Date:  03/24/2021 CLINICAL DATA:  surgery EXAM: CERVICAL SPINE - 2-3 VIEW; DG C-ARM 1-60 MIN COMPARISON:  None. FINDINGS: Fluoro time: 5 seconds. Five C-arm fluoroscopic images were obtained intraoperatively and submitted for post operative interpretation. The initial image demonstrates a probe with its tip at the C3-4 anterior disc space. Subsequent images demonstrate placement of anterior plate and screws spanning C3-C6 with intervening biomechanical devices at C3-C4, C4-C5, and C5-C6. Expected swelling/gas. Please see the performing provider's procedural report for further detail. IMPRESSION: Intraoperative fluoroscopy, as detailed above. Electronically Signed   By: Margaretha Sheffield M.D.   On: 03/24/2021 13:23   DG C-Arm 1-60 Min  Result Date: 03/24/2021 CLINICAL DATA:  surgery EXAM: CERVICAL SPINE - 2-3 VIEW; DG C-ARM 1-60 MIN COMPARISON:  None. FINDINGS: Fluoro time: 5 seconds. Five C-arm fluoroscopic images were obtained intraoperatively and submitted  for post operative interpretation. The initial image demonstrates a probe with its tip at the C3-4 anterior disc space. Subsequent images demonstrate placement of anterior plate and screws spanning C3-C6 with intervening biomechanical devices at C3-C4, C4-C5, and C5-C6. Expected swelling/gas. Please see the performing provider's procedural report for further detail. IMPRESSION: Intraoperative fluoroscopy, as detailed above. Electronically Signed   By: Margaretha Sheffield M.D.   On: 03/24/2021 13:23    Impression/Recommendations Active Problems:   Cervical myelopathy (HCC)  Cervical myelopathy with progressive symptoms status post ACDF C3-6 by neurosurgery Dr. Izora Ribas on 03/24/2021. Management per neurosurgery.  ESRD s/p bilateral renal transplant, on immunosuppressants Per primary, transplant team had recommended to hold off mycophenolate for 2 days and to continue prednisone and tacrolimus. Monitor closely while on immunosuppressants. Creatinine stable  at 1.18 with GFR 49 We will start gentle IV fluid hydration LR at 50 cc/h x 1 day. Closely monitor urine output.  Bilateral lower extremity edema, POA States she noted this a few days ago and had mentioned it to her endocrinologist. She denies any pain in her lower extremities. Will monitor for now while on IV fluids Elevated extremities and continue TED hose.  Paroxysmal A. fib on Eliquis Resume home cardiac regimen Resume Eliquis once okay with primary team improved  Insulin-dependent type 2 diabetes with hyperlipidemia Will obtain hemoglobin A1c Start insulin sliding scale Hold off home oral hypoglycemics for now, can resume once able to tolerate orals or diet.  Essential hypertension, BPs not at goal. BP elevated. Resume home oral antihypertensives IV hydralazine as needed with parameters Continue to closely monitor vital signs  Hyperlipidemia Resume home Lipitor and Zetia  Ambulatory dysfunction Reported balance issues PT OT to assess when okay with neurosurgery Fall precautions.    Thank you for this consultation.  Our [facility] hospitalist team will follow the patient with you.   Time Spent: 55 minutes.  Kayleen Memos M.D. [facility] 03/24/2021, 1:52 PM

## 2021-03-25 ENCOUNTER — Encounter: Payer: 59 | Admitting: Physical Therapy

## 2021-03-25 ENCOUNTER — Encounter: Payer: Self-pay | Admitting: Neurosurgery

## 2021-03-25 LAB — CBC
HCT: 38.1 % (ref 36.0–46.0)
Hemoglobin: 12.6 g/dL (ref 12.0–15.0)
MCH: 32.5 pg (ref 26.0–34.0)
MCHC: 33.1 g/dL (ref 30.0–36.0)
MCV: 98.2 fL (ref 80.0–100.0)
Platelets: 191 10*3/uL (ref 150–400)
RBC: 3.88 MIL/uL (ref 3.87–5.11)
RDW: 13.8 % (ref 11.5–15.5)
WBC: 11.2 10*3/uL — ABNORMAL HIGH (ref 4.0–10.5)
nRBC: 0 % (ref 0.0–0.2)

## 2021-03-25 LAB — COMPREHENSIVE METABOLIC PANEL
ALT: 11 U/L (ref 0–44)
AST: 20 U/L (ref 15–41)
Albumin: 3.4 g/dL — ABNORMAL LOW (ref 3.5–5.0)
Alkaline Phosphatase: 78 U/L (ref 38–126)
Anion gap: 6 (ref 5–15)
BUN: 16 mg/dL (ref 8–23)
CO2: 24 mmol/L (ref 22–32)
Calcium: 8.9 mg/dL (ref 8.9–10.3)
Chloride: 108 mmol/L (ref 98–111)
Creatinine, Ser: 1.02 mg/dL — ABNORMAL HIGH (ref 0.44–1.00)
GFR, Estimated: 59 mL/min — ABNORMAL LOW (ref >60)
Glucose, Bld: 207 mg/dL — ABNORMAL HIGH (ref 70–99)
Potassium: 4.5 mmol/L (ref 3.5–5.1)
Sodium: 138 mmol/L (ref 135–145)
Total Bilirubin: 0.6 mg/dL (ref 0.3–1.2)
Total Protein: 6.4 g/dL — ABNORMAL LOW (ref 6.5–8.1)

## 2021-03-25 LAB — HEMOGLOBIN A1C
Hgb A1c MFr Bld: 5.6 % (ref 4.8–5.6)
Mean Plasma Glucose: 114.02 mg/dL

## 2021-03-25 LAB — GLUCOSE, CAPILLARY
Glucose-Capillary: 151 mg/dL — ABNORMAL HIGH (ref 70–99)
Glucose-Capillary: 148 mg/dL — ABNORMAL HIGH (ref 70–99)
Glucose-Capillary: 176 mg/dL — ABNORMAL HIGH (ref 70–99)
Glucose-Capillary: 164 mg/dL — ABNORMAL HIGH (ref 70–99)

## 2021-03-25 LAB — MAGNESIUM: Magnesium: 1.4 mg/dL — ABNORMAL LOW (ref 1.7–2.4)

## 2021-03-25 MED ORDER — MYCOPHENOLATE MOFETIL 250 MG PO CAPS
500.0000 mg | ORAL_CAPSULE | Freq: Two times a day (BID) | ORAL | Status: DC
  Administered 2021-03-27: 500 mg via ORAL
  Filled 2021-03-25 (×3): qty 2

## 2021-03-25 MED ORDER — MAGNESIUM SULFATE 4 GM/100ML IV SOLN
4.0000 g | Freq: Once | INTRAVENOUS | Status: AC
  Administered 2021-03-25: 4 g via INTRAVENOUS
  Filled 2021-03-25: qty 100

## 2021-03-25 MED ORDER — SEVOFLURANE IN SOLN
RESPIRATORY_TRACT | Status: AC
  Filled 2021-03-25: qty 250

## 2021-03-25 NOTE — Evaluation (Signed)
Occupational Therapy Evaluation Patient Details Name: Jean Johnson MRN: [id number] DOB: Mar 16, 1951 Today's Date: 03/25/2021   History of Present Illness 70 y/o female s/p C3-6 ACDF for cervical myelopathy and radiculopathy 03/24/21.   Clinical Impression   Patient presenting with decreased I in self care, balance, functional mobility/transfers, endurance, and safety awareness. Patient reports being independent at baseline with use of SPC as needed. Pt lives with her husband who is available to assist as needed. Patient currently performing functional mobility with min HHA for short distance ambulation. OT discussed pt will have increased support and balance with use of AD at discharge. Pt demonstrates use of figure four position for LB self care tasks. Pt does report bedroom is upstairs and would like to be able to get to her bedroom safely but does on occasion sleep in recliner chair downstairs. Patient will benefit from acute OT to increase overall independence in the areas of ADLs, functional mobility, and safety awareness in order to safely discharge home with family assistance.       Recommendations for follow up therapy are one component of a multi-disciplinary discharge planning process, led by the attending physician.  Recommendations may be updated based on patient status, additional functional criteria and insurance authorization.   Follow Up Recommendations  Home health OT;Supervision - Intermittent    Equipment Recommendations  None recommended by OT       Precautions / Restrictions Precautions Precautions: Cervical Required Braces or Orthoses: Cervical Brace Cervical Brace: Hard collar Restrictions Weight Bearing Restrictions: No      Mobility Bed Mobility Overal bed mobility: Modified Independent             General bed mobility comments: Pt seated in recliner chair    Transfers Overall transfer level: Needs assistance Equipment used: 1 person hand held  assist Transfers: Sit to/from Stand;Stand Pivot Transfers Sit to Stand: Min guard Stand pivot transfers: Min guard;Min assist       General transfer comment: Pt with some initial hesitancy with getting to standing citing weakness, but was able to rise w/o direct assist    Balance Overall balance assessment: Needs assistance Sitting-balance support: Feet supported Sitting balance-Leahy Scale: Good     Standing balance support: During functional activity Standing balance-Leahy Scale: Fair Standing balance comment: UE support                           ADL either performed or assessed with clinical judgement   ADL Overall ADL's : Needs assistance/impaired     Grooming: Wash/dry hands;Wash/dry face;Standing;Min guard;Minimal Scientist, research (physical sciences): Minimal assistance   Toileting- Water quality scientist and Hygiene: Minimal assistance       Functional mobility during ADLs: Min guard;Minimal assistance General ADL Comments: HHA for functional mobility and toileting needs. Pt able to demonstrate figure four position for LB dressing/threading onto B feet.     Vision Patient Visual Report: No change from baseline              Pertinent Vitals/Pain Pain Assessment: 0-10 Pain Score: 4  Pain Location: anterior neck Pain Descriptors / Indicators: Aching;Discomfort Pain Intervention(s): Monitored during session;Premedicated before session;Repositioned     Hand Dominance Right   Extremity/Trunk Assessment Upper Extremity Assessment Upper Extremity Assessment: Generalized weakness (chronic R UE numbness- pt reports unchanged from surgery)   Lower Extremity Assessment Lower Extremity Assessment:  Generalized weakness       Communication Communication Communication: No difficulties   Cognition Arousal/Alertness: Awake/alert Behavior During Therapy: WFL for tasks assessed/performed Overall Cognitive Status: Within Functional Limits  for tasks assessed                                 General Comments: Pt is pleasant and cooperative throughout              [place] expects to be discharged to:: Private residence Living Arrangements: Spouse/significant other Available Help at Discharge: Family;Available 24 hours/day Type of Home: Apartment Home Access: Level entry     Home Layout: Two level;Bed/bath upstairs Alternate Level Stairs-Number of Steps: 14 Alternate Level Stairs-Rails:  (yes)       Bathroom Accessibility: Yes   Home Equipment: Walker - 4 wheels;Cane - single point   Additional Comments: Plan for sink bathing at home at discharge      Prior Functioning/Environment Level of Independence: Independent with assistive device(s)        Comments: Pt reports that she does not necessarily need an AD all the time in the home, does use cane out of the home and typically will use transport chair,        OT Problem List: Decreased strength;Decreased activity tolerance;Impaired balance (sitting and/or standing);Decreased safety awareness;Pain;Decreased knowledge of use of DME or AE      OT Treatment/Interventions: Self-care/ADL training;Manual therapy;Therapeutic exercise;Modalities;Patient/family education;Balance training;Energy conservation;Therapeutic activities;DME and/or AE instruction    OT Goals(Current goals can be found in the care plan section) Acute Rehab OT Goals Patient Stated Goal: Go home OT Goal Formulation: With patient Time For Goal Achievement: 04/08/21 Potential to Achieve Goals: Good ADL Goals Pt Will Perform Grooming: with modified independence Pt Will Perform Lower Body Dressing: with modified independence;sit to/from stand Pt Will Transfer to Toilet: with modified independence;ambulating Pt Will Perform Toileting - Clothing Manipulation and hygiene: with modified independence;sit to/from stand  OT Frequency: Min 2X/week   Barriers to D/C:     none known at this time          AM-PAC OT "6 Clicks" Daily Activity     Outcome Measure Help from another person eating meals?: A Little Help from another person taking care of personal grooming?: A Little Help from another person toileting, which includes using toliet, bedpan, or urinal?: A Little Help from another person bathing (including washing, rinsing, drying)?: A Little Help from another person to put on and taking off regular upper body clothing?: A Little Help from another person to put on and taking off regular lower body clothing?: A Little 6 Click Score: 18   End of Session Nurse Communication: Mobility status;Precautions  Activity Tolerance: Patient tolerated treatment well Patient left: with call bell/phone within reach;in chair;with chair alarm set  OT Visit Diagnosis: Unsteadiness on feet (R26.81);Muscle weakness (generalized) (M62.81)                Time: [id number] OT Time Calculation (min): 20 min Charges:  OT General Charges $OT Visit: 1 Visit OT Evaluation $OT Eval Moderate Complexity: 1 Mod OT Treatments $Self Care/Home Management : 8-22 mins  Darleen Crocker, MS, OTR/L , CBIS ascom [phone]  03/25/21, 11:40 AM

## 2021-03-25 NOTE — Progress Notes (Addendum)
    Attending Progress Note  History: Jean Johnson is POD#1 from C3-6 ACDF for cervical myelopathy and radiculopathy.  POD#1: She is getting breakfast well but does note some mild dysphagia.  She continues to have some neck pain.  Physical Exam: Vitals:   03/25/21 0602 03/25/21 0800  BP: (!) 182/164 (!) 175/67  Pulse: 80 73  Resp: 20 15  Temp: 97.9 F (36.6 C) 97.9 F (36.6 C)  SpO2: 97% 97%    AA Ox3 CNI  4/5 throughout BUE. 5/5 throughout BLE Incision with Dermabond in place. HV output 100 overnight   Data:  Recent Labs  Lab 03/25/21 0243  NA 138  K 4.5  CL 108  CO2 24  BUN 16  CREATININE 1.02*  GLUCOSE 207*  CALCIUM 8.9   Recent Labs  Lab 03/25/21 0243  AST 20  ALT 11  ALKPHOS 78     Recent Labs  Lab 03/25/21 0243  WBC 11.2*  HGB 12.6  HCT 38.1  PLT 191   No results for input(s): APTT, INR in the last 168 hours.         Assessment/Plan:  Jean Johnson is a 70 y.o with a medical history of  ESRD s/p bilateral renal transplant, A Fib on Eliquis, type 2 DM, HTN, and HLD, s/p C3-6 ACDF cervical myelopathy and radiculopathy.  - mobilize - pain control - DVT prophylaxis; SCDs. Holding chemical DVT prophylaxis at this time due to drain output. - PTOT; already has home health services established for discharge. - Continue HV due to output - c-collar when out of bed. -We appreciate medicine's assistance with additional medical management.  Cooper Render  Department of Neurosurgery

## 2021-03-25 NOTE — Evaluation (Signed)
Physical Therapy Evaluation Patient Details Name: Jean Johnson MRN: [id number] DOB: 1951/04/30 Today's Date: 03/25/2021  History of Present Illness  70 y/o female s/p C3-6 ACDF for cervical myelopathy and radiculopathy 03/24/21.  Clinical Impression  Pt did well with activity/movement in bed despite being somewhat slow and guarded with the effort.  She reports expected surgical pain (anterior neck) but was good about using Aspen Collar and does not report change in baseline numbness in R U&LEs.  She does go to outpatient PT currently for vestibular issues, f/u recs per neuro/limitations.       Recommendations for follow up therapy are one component of a multi-disciplinary discharge planning process, led by the attending physician.  Recommendations may be updated based on patient status, additional functional criteria and insurance authorization.  Follow Up Recommendations Follow surgeon's recommendation for DC plan and follow-up therapies (currently has outpt PT for vestibular issues; neck movement limitations may preclude continuing this short term; further recs regarding post-surgery PT deferred to Neuro team)    Equipment Recommendations  None recommended by PT    Recommendations for Other Services       Precautions / Restrictions Precautions Precautions: Cervical Required Braces or Orthoses: Cervical Brace Cervical Brace: Hard collar Restrictions Weight Bearing Restrictions: No      Mobility  Bed Mobility Overal bed mobility: Modified Independent             General bed mobility comments: Pt seated in recliner chair    Transfers Overall transfer level: Needs assistance Equipment used: 1 person hand held assist Transfers: Sit to/from Stand;Stand Pivot Transfers Sit to Stand: Min guard Stand pivot transfers: Min guard;Min assist       General transfer comment: Pt with some initial hesitancy with getting to standing citing weakness, but was able to rise w/o  direct assist  Ambulation/Gait Ambulation/Gait assistance: Min guard Gait Distance (Feet): 100 Feet Assistive device: Rolling walker (2 wheeled)       General Gait Details: Pt with slow but steady gait around central pod.  She endorses fatigue (vitals stable) and need to sit after the effort but did not have any unsteadiness or overt safety issues.  Stairs            Wheelchair Mobility    Modified Rankin (Stroke Patients Only)       Balance Overall balance assessment: Needs assistance Sitting-balance support: Feet supported Sitting balance-Leahy Scale: Good     Standing balance support: During functional activity Standing balance-Leahy Scale: Fair Standing balance comment: UE support                             Pertinent Vitals/Pain Pain Assessment: 0-10 Pain Score: 4  Pain Location: anterior neck Pain Descriptors / Indicators: Aching;Discomfort Pain Intervention(s): Monitored during session;Premedicated before session;Repositioned    Home Living Family/patient expects to be discharged to:: Private residence Living Arrangements: Spouse/significant other Available Help at Discharge: Family;Available 24 hours/day Type of Home: Apartment Home Access: Level entry     Home Layout: Two level;Bed/bath upstairs Home Equipment: Walker - 4 wheels;Cane - single point Additional Comments: Plan for sink bathing at home at discharge    Prior Function Level of Independence: Independent with assistive device(s)         Comments: Pt reports that she does not necessarily need an AD all the time in the home, does use cane out of the home and typically will use transport chair,  Hand Dominance   Dominant Hand: Right    Extremity/Trunk Assessment   Upper Extremity Assessment Upper Extremity Assessment: Generalized weakness (chronic R UE numbness- pt reports unchanged from surgery)    Lower Extremity Assessment Lower Extremity Assessment:  Generalized weakness       Communication   Communication: No difficulties  Cognition Arousal/Alertness: Awake/alert Behavior During Therapy: WFL for tasks assessed/performed Overall Cognitive Status: Within Functional Limits for tasks assessed                                 General Comments: Pt is pleasant and cooperative throughout      General Comments      Exercises     Assessment/Plan    PT Assessment Patient needs continued PT services  PT Problem List Decreased strength;Decreased range of motion;Decreased activity tolerance;Decreased balance;Decreased mobility;Decreased coordination;Decreased knowledge of use of DME;Decreased safety awareness;Pain       PT Treatment Interventions DME instruction;Gait training;Stair training;Functional mobility training;Therapeutic activities;Therapeutic exercise;Balance training;Neuromuscular re-education;Patient/family education    PT Goals (Current goals can be found in the Care Plan section)  Acute Rehab PT Goals Patient Stated Goal: Go home PT Goal Formulation: With patient Time For Goal Achievement: 04/08/21 Potential to Achieve Goals: Good    Frequency 7X/week   Barriers to discharge        Co-evaluation               AM-PAC PT "6 Clicks" Mobility  Outcome Measure Help needed turning from your back to your side while in a flat bed without using bedrails?: A Little Help needed moving from lying on your back to sitting on the side of a flat bed without using bedrails?: A Little Help needed moving to and from a bed to a chair (including a wheelchair)?: A Little Help needed standing up from a chair using your arms (e.g., wheelchair or bedside chair)?: A Little Help needed to walk in hospital room?: A Little Help needed climbing 3-5 steps with a railing? : A Lot 6 Click Score: 17    End of Session Equipment Utilized During Treatment: Gait belt Activity Tolerance: Patient tolerated treatment  well Patient left: with chair alarm set;with call bell/phone within reach Nurse Communication: Mobility status PT Visit Diagnosis: Muscle weakness (generalized) (M62.81);Unsteadiness on feet (R26.81);Difficulty in walking, not elsewhere classified (R26.2);Other symptoms and signs involving the nervous system (R29.898)    Time: [id number] PT Time Calculation (min) (ACUTE ONLY): 28 min   Charges:   PT Evaluation $PT Eval Low Complexity: 1 Low PT Treatments $Gait Training: 8-22 mins        Kreg Shropshire, DPT 03/25/2021, 11:32 AM

## 2021-03-25 NOTE — Consult Note (Signed)
Medical Consultation   Jean Johnson  [id number]  DOB: [birth year]  DOA: 03/24/2021  PCP: Lyla Son, MD   Outpatient Specialists: Neurosurgery.   Requesting physician: Dr. Izora Ribas  Reason for consultation: Diabetes, medical management.   History of Present Illness: Jean Johnson is an 70 y.o. female with past medical history significant for insulin-dependent type 2 diabetes, diabetic peripheral neuropathy, ESRD status post bilateral kidney transplant on immunosuppressants, anemia of chronic disease, paroxysmal atrial fibrillation on Eliquis, hyperlipidemia, essential hypertension, nephrolithiasis, B12 deficiency, osteoporosis, spinal stenosis of cervical region, who was admitted by the neurosurgical team after undergoing ACDF C3-6 on 03/24/2021 by Dr. Izora Ribas.  Per neurosurgery no surgical complication.  [facility] was called to consult for management of her diabetes and her chronic medical conditions.  Patient reports successful surgery. Says mouth is dry, no significant pain. Tolerating diet. No fevers or cough.  Review of Systems:  ROS As per HPI otherwise 10 point review of systems negative.    Past Medical History: Past Medical History:  Diagnosis Date   Anemia of chronic renal disease    Aortic stenosis, mild    a.) TTE on 11/23/2020 --> mean gradient 9.7 mmHg   Atrial fibrillation (HCC)    a.) CHA2DS2-VASc = 3 (age, sex, diabetes). b.) daily apixaban   B12 deficiency    Cervical spinal stenosis    Chronic anticoagulation    Apixaban   Diabetic neuropathy (HCC)    Diverticulosis    Dyspnea    ESRD (end stage renal disease) (HCC)    First degree AV block    HLD (hyperlipidemia)    Hypertension    Incomplete right bundle branch block (RBBB)    LAE (left atrial enlargement)    a.) TTE on 11/23/2020 --> moderate   Left thyroid nodule    a.) Cervical MRI on 12/29/2020 --> measured "at least" 3.5 cm; imcompletely imaged.   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   Valvular heart disease    a.) TTE 11/23/2020 --> mild panvalvular regurgitation    Past Surgical History: Past Surgical History:  Procedure Laterality Date   ANTERIOR CERVICAL DECOMP/DISCECTOMY FUSION N/A 03/24/2021   Procedure: C3-6 ANTERIOR CERVICAL DECOMPRESSION/DISCECTOMY FUSION 3 LEVELS;  Surgeon: Meade Maw, MD;  Location: [facility];  Service: Neurosurgery;  Laterality: N/A;   CATARACT EXTRACTION W/PHACO Left 10/21/2020   Procedure: CATARACT EXTRACTION PHACO AND INTRAOCULAR LENS PLACEMENT ([place]) DIABETES LEFT;  Surgeon: Leandrew Koyanagi, MD;  Location: [facility];  Service: Ophthalmology;  Laterality: Left;  2.84 0:48.8 5.8%   EYE SURGERY Right 2020   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   Living donor organ recipient (sister)   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   Cadaveric organ recipient     Allergies:   Allergies  Allergen Reactions   E-Mycin [Erythromycin] Swelling    And itching    Penicillins Swelling    Throat      Social History:  reports that she has never smoked. She has never used smokeless  tobacco. She reports that she does not currently use alcohol. She reports that she does not use drugs.   Family History: History reviewed. No pertinent family history.   Physical Exam: Vitals:   03/24/21 1520 03/24/21 2030 03/25/21 0602 03/25/21 0800  BP: (!) 169/75 (!) 153/82 (!) 182/164 (!) 175/67  Pulse: 79 89 80 73  Resp: '15 18 20 15  '$ Temp: 98.6 F (37 C) 98.4 F (36.9 C) 97.9 F (36.6 C)  97.9 F (36.6 C)  TempSrc:  Oral  Oral  SpO2: 97%  97% 97%  Weight:      Height:        Constitutional: Alert and awake, NAD, collar in place Eyes: non-icteric ENMT: mmm Neck: cervical collar in place, jp drain draining blood CVS: S1-S2 clear, trace pedal edema, soft systolic murmur Respiratory:  clear to auscultation bilaterally, no wheezing, rales or rhonchi. Respiratory effort normal. No accessory muscle use.  Abdomen: soft nontender, nondistended, normal bowel sounds, no hepatosplenomegaly, no hernias  Musculoskeletal: : no cyanosis or clubbing.  Mild lower extremity edema bilaterally, left greater than right.  No tenderness with palpation.               Neuro: moving all 4 extremities Psych: judgement and insight appear normal, stable mood and affect, mental status Skin: no rashes or lesions or ulcers, no induration or nodules   Data reviewed:  I have personally reviewed following labs and imaging studies Labs:  CBC: Recent Labs  Lab 03/25/21 0243  WBC 11.2*  HGB 12.6  HCT 38.1  MCV 98.2  PLT 99991111    Basic Metabolic Panel: Recent Labs  Lab 03/25/21 0243  NA 138  K 4.5  CL 108  CO2 24  GLUCOSE 207*  BUN 16  CREATININE 1.02*  CALCIUM 8.9  MG 1.4*   GFR Estimated Creatinine Clearance: 38.2 mL/min (A) (by C-G formula based on SCr of 1.02 mg/dL (H)). Liver Function Tests: Recent Labs  Lab 03/25/21 0243  AST 20  ALT 11  ALKPHOS 78  BILITOT 0.6  PROT 6.4*  ALBUMIN 3.4*   No results for input(s): LIPASE, AMYLASE in the last 168 hours. No results for input(s): AMMONIA in the last 168 hours. Coagulation profile No results for input(s): INR, PROTIME in the last 168 hours.  Cardiac Enzymes: No results for input(s): CKTOTAL, CKMB, CKMBINDEX, TROPONINI in the last 168 hours. BNP: Invalid input(s): POCBNP CBG: Recent Labs  Lab 03/24/21 1452 03/24/21 1806 03/24/21 2116 03/25/21 0745 03/25/21 1131  GLUCAP 153* 219* 174* 151* 148*    D-Dimer No  results for input(s): DDIMER in the last 72 hours. Hgb A1c Recent Labs    03/24/21 1459 03/25/21 0243  HGBA1C 5.5 5.6   Lipid Profile No results for input(s): CHOL, HDL, LDLCALC, TRIG, CHOLHDL, LDLDIRECT in the last 72 hours. Thyroid function studies No results for input(s): TSH, T4TOTAL, T3FREE, THYROIDAB in the last 72 hours.  Invalid input(s): FREET3 Anemia work up No results for input(s): VITAMINB12, FOLATE, FERRITIN, TIBC, IRON, RETICCTPCT in the last 72 hours. Urinalysis    Component Value Date/Time   APPEARANCEUR Hazy (A) 11/16/2020 1344   GLUCOSEU Negative 11/16/2020 1344   BILIRUBINUR Negative 11/16/2020 1344   PROTEINUR 3+ (A) 11/16/2020 1344   NITRITE Negative 11/16/2020 1344   LEUKOCYTESUR Negative 11/16/2020 1344     Microbiology Recent Results (from the past 240 hour(s))  Surgical pcr screen     Status: None   Collection Time: 03/16/21 12:22 PM   Specimen:  Nasal Mucosa; Nasal Swab  Result Value Ref Range Status   MRSA, PCR NEGATIVE NEGATIVE Final   Staphylococcus aureus NEGATIVE NEGATIVE Final    Comment: (NOTE) The Xpert SA Assay (FDA approved for NASAL specimens in patients 79 years of age and older), is one component of a comprehensive surveillance program. It is not intended to diagnose infection nor to guide or monitor treatment. Performed at [facility], [place], [place] [address]   SARS CORONAVIRUS 2 (TAT 6-24 HRS) Nasopharyngeal Nasopharyngeal Swab     Status: None   Collection Time: 03/22/21  9:54 AM   Specimen: Nasopharyngeal Swab  Result Value Ref Range Status   SARS Coronavirus 2 NEGATIVE NEGATIVE Final    Comment: (NOTE) SARS-CoV-2 target nucleic acids are NOT DETECTED.  The SARS-CoV-2 RNA is generally detectable in upper and lower respiratory specimens during the acute phase of infection. Negative results do not preclude SARS-CoV-2 infection, do not rule out co-infections with other pathogens, and should not  be used as the sole basis for treatment or other patient management decisions. Negative results must be combined with clinical observations, patient history, and epidemiological information. The expected result is Negative.  Fact Sheet for Patients: SugarRoll.be  Fact Sheet for Healthcare Providers: https://www.woods-mathews.com/  This test is not yet approved or cleared by the [place] FDA and  has been authorized for detection and/or diagnosis of SARS-CoV-2 by FDA under an Emergency Use Authorization (EUA). This EUA will remain  in effect (meaning this test can be used) for the duration of the COVID-19 declaration under Se ction 564(b)(1) of the Act, 21 U.S.C. section 360bbb-3(b)(1), unless the authorization is terminated or revoked sooner.  Performed at [facility], [place] [address] [place], [place] [address]        Inpatient Medications:   Scheduled Meds:  atorvastatin  10 mg Oral Daily   diltiazem  240 mg Oral QHS   ezetimibe  10 mg Oral Daily   flecainide  100 mg Oral Q12H   insulin aspart  0-5 Units Subcutaneous QHS   insulin aspart  0-6 Units Subcutaneous TID WC   insulin detemir  8 Units Subcutaneous QHS   lisinopril  30 mg Oral Daily   [START ON 03/26/2021] mycophenolate  500 mg Oral BID   potassium chloride SA  20 mEq Oral TID   predniSONE  5 mg Oral Q breakfast   pregabalin  50 mg Oral BID   senna  1 tablet Oral BID   tacrolimus  3 mg Oral BID   cyanocobalamin  2,000 mcg Oral Daily   Continuous Infusions:  methocarbamol (ROBAXIN) IV       Radiological Exams on Admission: DG Cervical Spine 2 or 3 views  Result Date: 03/24/2021 CLINICAL DATA:  surgery EXAM: CERVICAL SPINE - 2-3 VIEW; DG C-ARM 1-60 MIN COMPARISON:  None. FINDINGS: Fluoro time: 5 seconds. Five C-arm fluoroscopic images were obtained intraoperatively and submitted for post operative interpretation. The initial image demonstrates a probe  with its tip at the C3-4 anterior disc space. Subsequent images demonstrate placement of anterior plate and screws spanning C3-C6 with intervening biomechanical devices at C3-C4, C4-C5, and C5-C6. Expected swelling/gas. Please see the performing provider's procedural report for further detail. IMPRESSION: Intraoperative fluoroscopy, as detailed above. Electronically Signed   By: Margaretha Sheffield M.D.   On: 03/24/2021 13:23   DG C-Arm 1-60 Min  Result Date: 03/24/2021 CLINICAL DATA:  surgery EXAM: CERVICAL SPINE - 2-3 VIEW; DG C-ARM 1-60  MIN COMPARISON:  None. FINDINGS: Fluoro time: 5 seconds. Five C-arm fluoroscopic images were obtained intraoperatively and submitted for post operative interpretation. The initial image demonstrates a probe with its tip at the C3-4 anterior disc space. Subsequent images demonstrate placement of anterior plate and screws spanning C3-C6 with intervening biomechanical devices at C3-C4, C4-C5, and C5-C6. Expected swelling/gas. Please see the performing provider's procedural report for further detail. IMPRESSION: Intraoperative fluoroscopy, as detailed above. Electronically Signed   By: Margaretha Sheffield M.D.   On: 03/24/2021 13:23   DG C-Arm 1-60 Min  Result Date: 03/24/2021 CLINICAL DATA:  surgery EXAM: CERVICAL SPINE - 2-3 VIEW; DG C-ARM 1-60 MIN COMPARISON:  None. FINDINGS: Fluoro time: 5 seconds. Five C-arm fluoroscopic images were obtained intraoperatively and submitted for post operative interpretation. The initial image demonstrates a probe with its tip at the C3-4 anterior disc space. Subsequent images demonstrate placement of anterior plate and screws spanning C3-C6 with intervening biomechanical devices at C3-C4, C4-C5, and C5-C6. Expected swelling/gas. Please see the performing provider's procedural report for further detail. IMPRESSION: Intraoperative fluoroscopy, as detailed above. Electronically Signed   By: Margaretha Sheffield M.D.   On: 03/24/2021 13:23     Impression/Recommendations Active Problems:   Cervical myelopathy (HCC)  Cervical myelopathy with progressive symptoms status post ACDF C3-6 by neurosurgery Dr. Izora Ribas on 03/24/2021. Management per neurosurgery.  ESRD s/p bilateral renal transplant, on immunosuppressants Per primary, transplant team had recommended to hold off mycophenolate for 2 days and to continue prednisone and tacrolimus. Can thus resume mycophenolate tomorrow. Kidney function is at baseline. Did not receive stress-dose steroids perioperatively; appears to have tolerated surgery well. Can stop IVF today  Bilateral lower extremity edema, POA Trace lower extremity edema around ankles, present prior to surgery, exam not suggestive of DVT.  History hypokalemia Normokalemic here. Continue home potassium  Hypomagnesemia Mg 1.4. k wnl - will give Mg 4 g - monitor daily  Paroxysmal A. fib on Eliquis Continue home flecainide, diltiazem Resume eliquis when safe to do so from neurosurgical perspective  Insulin-dependent type 2 diabetes with hyperlipidemia A1c shows excellent control continue insulin sliding scale Continue  home levemir  Essential hypertension, BPs not at goal. BP elevated here. Review out outpt records shows good control on home meds so will hold adding additional chronic med for now. Continue home lisinopril, with hydralazine as needed  Hyperlipidemia Resumed home Lipitor and Zetia  Ambulatory dysfunction PT/OT, disposition per primary team  Diabetic neuropathy Continue home lyrica    Thank you for this consultation.  Our [facility] hospitalist team will follow the patient with you.   Time Spent: 35 minutes.  Desma Maxim M.D. [facility] 03/25/2021, 2:18 PM

## 2021-03-25 NOTE — TOC Progression Note (Signed)
Transition of Care [facility]) - Progression Note    Patient Details  Name: Arleda Lahiff MRN: [id number] Date of Birth: 1950-08-09  Transition of Care [facility]) CM/SW [place], RN Phone Number: 03/25/2021, 4:21 PM  Clinical Narrative:   Patient states she lives with spouse, who is at bedside.  Spouse can assist with care as needed.  No concerns with transportation to appointments or to the pharmacy.  She is able to take medications as directed.  [facility] to home health.  [facility] home [facility] to provide services, confirmed by [facility].  Patient has walker at home.  TOC contact information given, tOC to follow to discharge.         Expected Discharge Plan and Services                                                 Social Determinants of Health (SDOH) Interventions    Readmission Risk Interventions No flowsheet data found.

## 2021-03-26 LAB — BASIC METABOLIC PANEL
Anion gap: 4 — ABNORMAL LOW (ref 5–15)
BUN: 27 mg/dL — ABNORMAL HIGH (ref 8–23)
CO2: 24 mmol/L (ref 22–32)
Calcium: 9.5 mg/dL (ref 8.9–10.3)
Chloride: 109 mmol/L (ref 98–111)
Creatinine, Ser: 1.06 mg/dL — ABNORMAL HIGH (ref 0.44–1.00)
GFR, Estimated: 56 mL/min — ABNORMAL LOW (ref >60)
Glucose, Bld: 109 mg/dL — ABNORMAL HIGH (ref 70–99)
Potassium: 5 mmol/L (ref 3.5–5.1)
Sodium: 137 mmol/L (ref 135–145)

## 2021-03-26 LAB — MAGNESIUM: Magnesium: 2.9 mg/dL — ABNORMAL HIGH (ref 1.7–2.4)

## 2021-03-26 LAB — GLUCOSE, CAPILLARY
Glucose-Capillary: 96 mg/dL (ref 70–99)
Glucose-Capillary: 119 mg/dL — ABNORMAL HIGH (ref 70–99)
Glucose-Capillary: 182 mg/dL — ABNORMAL HIGH (ref 70–99)
Glucose-Capillary: 251 mg/dL — ABNORMAL HIGH (ref 70–99)

## 2021-03-26 MED ORDER — DEXAMETHASONE SODIUM PHOSPHATE 4 MG/ML IJ SOLN
4.0000 mg | Freq: Once | INTRAMUSCULAR | Status: AC
  Administered 2021-03-26: 4 mg via INTRAVENOUS
  Filled 2021-03-26: qty 1

## 2021-03-26 NOTE — Consult Note (Signed)
Medical Consultation   Jean Johnson  [id number]  DOB: [birth year]  DOA: 03/24/2021  PCP: Lyla Son, MD   Outpatient Specialists: Neurosurgery.   Requesting physician: Dr. Izora Ribas  Reason for consultation: Diabetes, medical management.   History of Present Illness: Jean Johnson is an 70 y.o. female with past medical history significant for insulin-dependent type 2 diabetes, diabetic peripheral neuropathy, ESRD status post bilateral kidney transplant on immunosuppressants, anemia of chronic disease, paroxysmal atrial fibrillation on Eliquis, hyperlipidemia, essential hypertension, nephrolithiasis, B12 deficiency, osteoporosis, spinal stenosis of cervical region, who was admitted by the neurosurgical team after undergoing ACDF C3-6 on 03/24/2021 by Dr. Izora Ribas.  Per neurosurgery no surgical complication.  [facility] was called to consult for management of her diabetes and her chronic medical conditions.  Patient reports successful surgery. Says mouth is dry, no significant pain. Tolerating diet. No fevers or cough.  Review of Systems:  ROS As per HPI otherwise 10 point review of systems negative.    Past Medical History: Past Medical History:  Diagnosis Date   Anemia of chronic renal disease    Aortic stenosis, mild    a.) TTE on 11/23/2020 --> mean gradient 9.7 mmHg   Atrial fibrillation (HCC)    a.) CHA2DS2-VASc = 3 (age, sex, diabetes). b.) daily apixaban   B12 deficiency    Cervical spinal stenosis    Chronic anticoagulation    Apixaban   Diabetic neuropathy (HCC)    Diverticulosis    Dyspnea    ESRD (end stage renal disease) (HCC)    First degree AV block    HLD (hyperlipidemia)    Hypertension    Incomplete right bundle branch block (RBBB)    LAE (left atrial enlargement)    a.) TTE on 11/23/2020 --> moderate   Left thyroid nodule    a.) Cervical MRI on 12/29/2020 --> measured "at least" 3.5 cm; imcompletely imaged.   Long-term  use of immunosuppressant medication    a.) takes daily mycophenolate, tacrolimus, prednisone   Murmur    Nephrolithiasis    Osteoporosis    PONV (postoperative nausea and vomiting)    Post-transplant diabetes mellitus ([place])    Renal transplant recipient    a.) living donor transplant from sister on 12/26/1998; rejected organ in 2006 and restarted on hemodialysis. 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   Valvular heart disease    a.) TTE 11/23/2020 --> mild panvalvular regurgitation    Past Surgical History: Past Surgical History:  Procedure Laterality Date   ANTERIOR CERVICAL DECOMP/DISCECTOMY FUSION N/A 03/24/2021   Procedure: C3-6 ANTERIOR CERVICAL DECOMPRESSION/DISCECTOMY FUSION 3 LEVELS;  Surgeon: Meade Maw, MD;  Location: [facility];  Service: Neurosurgery;  Laterality: N/A;   CATARACT EXTRACTION W/PHACO Left 10/21/2020   Procedure: CATARACT EXTRACTION PHACO AND INTRAOCULAR LENS PLACEMENT ([place]) DIABETES LEFT;  Surgeon: Leandrew Koyanagi, MD;  Location: [place];  Service: Ophthalmology;  Laterality: Left;  2.84 0:48.8 5.8%   EYE SURGERY Right 2020   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   Living donor organ recipient (sister)   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   Cadaveric organ recipient     Allergies:   Allergies  Allergen Reactions   E-Mycin [Erythromycin] Swelling    And itching    Penicillins Swelling    Throat      Social History:  reports that she has never smoked. She has never used smokeless  tobacco. She reports that she does not currently use alcohol. She reports that she does not use drugs.   Family History: History reviewed. No pertinent family history.   Physical Exam: Vitals:   03/25/21 1632 03/25/21 2214 03/26/21 0835 03/26/21 1207  BP: (!) 148/60 (!) 146/60 (!) 170/59 135/63  Pulse: (!) 58 75 65 60  Resp: '16 17 15 18  '$ Temp: 98.6 F (37 C) 98 F (36.7 C) 98.1 F (36.7 C)  98.8 F (37.1 C)  TempSrc: Oral     SpO2: 96%  100% 100%  Weight:      Height:        Constitutional: Alert and awake, NAD, collar in place Eyes: non-icteric ENMT: mmm Neck: cervical collar in place, jp drain draining blood CVS: S1-S2 clear, trace pedal edema, soft systolic murmur Respiratory:  clear to auscultation bilaterally, no wheezing, rales or rhonchi. Respiratory effort normal. No accessory muscle use.  Abdomen: soft nontender, nondistended,  Musculoskeletal: : no cyanosis or clubbing.  Mild lower extremity edema bilaterally, left greater than right.  No tenderness with palpation.               Neuro: moving all 4 extremities Psych: judgement and insight appear normal, stable mood and affect, mental status Skin: no rashes or lesions or ulcers, no induration or nodules   Data reviewed:  I have personally reviewed following labs and imaging studies Labs:  CBC: Recent Labs  Lab 03/25/21 0243  WBC 11.2*  HGB 12.6  HCT 38.1  MCV 98.2  PLT 191     Basic Metabolic Panel: Recent Labs  Lab 03/25/21 0243 03/26/21 0349  NA 138 137  K 4.5 5.0  CL 108 109  CO2 24 24  GLUCOSE 207* 109*  BUN 16 27*  CREATININE 1.02* 1.06*  CALCIUM 8.9 9.5  MG 1.4* 2.9*    GFR Estimated Creatinine Clearance: 36.7 mL/min (A) (by C-G formula based on SCr of 1.06 mg/dL (H)). Liver Function Tests: Recent Labs  Lab 03/25/21 0243  AST 20  ALT 11  ALKPHOS 78  BILITOT 0.6  PROT 6.4*  ALBUMIN 3.4*    No results for input(s): LIPASE, AMYLASE in the last 168 hours. No results for input(s): AMMONIA in the last 168 hours. Coagulation profile No results for input(s): INR, PROTIME in the last 168 hours.  Cardiac Enzymes: No results for input(s): CKTOTAL, CKMB, CKMBINDEX, TROPONINI in the last 168 hours. BNP: Invalid input(s): POCBNP CBG: Recent Labs  Lab 03/25/21 1131 03/25/21 1648 03/25/21 2149 03/26/21 0832 03/26/21 1245  GLUCAP 148* 176* 164* 96 119*    D-Dimer No  results for input(s): DDIMER in the last 72 hours. Hgb A1c Recent Labs    03/24/21 1459 03/25/21 0243  HGBA1C 5.5 5.6    Lipid Profile No results for input(s): CHOL, HDL, LDLCALC, TRIG, CHOLHDL, LDLDIRECT in the last 72 hours. Thyroid function studies No results for input(s): TSH, T4TOTAL, T3FREE, THYROIDAB in the last 72 hours.  Invalid input(s): FREET3 Anemia work up No results for input(s): VITAMINB12, FOLATE, FERRITIN, TIBC, IRON, RETICCTPCT in the last 72 hours. Urinalysis    Component Value Date/Time   APPEARANCEUR Hazy (A) 11/16/2020 1344   GLUCOSEU Negative 11/16/2020 1344   BILIRUBINUR Negative 11/16/2020 1344   PROTEINUR 3+ (A) 11/16/2020 1344   NITRITE Negative 11/16/2020 1344   LEUKOCYTESUR Negative 11/16/2020 1344     Microbiology Recent Results (from the past 240 hour(s))  SARS CORONAVIRUS 2 (TAT 6-24 HRS) Nasopharyngeal Nasopharyngeal Swab  Status: None   Collection Time: 03/22/21  9:54 AM   Specimen: Nasopharyngeal Swab  Result Value Ref Range Status   SARS Coronavirus 2 NEGATIVE NEGATIVE Final    Comment: (NOTE) SARS-CoV-2 target nucleic acids are NOT DETECTED.  The SARS-CoV-2 RNA is generally detectable in upper and lower respiratory specimens during the acute phase of infection. Negative results do not preclude SARS-CoV-2 infection, do not rule out co-infections with other pathogens, and should not be used as the sole basis for treatment or other patient management decisions. Negative results must be combined with clinical observations, patient history, and epidemiological information. The expected result is Negative.  Fact Sheet for Patients: SugarRoll.be  Fact Sheet for Healthcare Providers: https://www.woods-mathews.com/  This test is not yet approved or cleared by the [place] FDA and  has been authorized for detection and/or diagnosis of SARS-CoV-2 by FDA under an Emergency Use  Authorization (EUA). This EUA will remain  in effect (meaning this test can be used) for the duration of the COVID-19 declaration under Se ction 564(b)(1) of the Act, 21 U.S.C. section 360bbb-3(b)(1), unless the authorization is terminated or revoked sooner.  Performed at [facility], [place] [address], [place], [place] [address]        Inpatient Medications:   Scheduled Meds:  atorvastatin  10 mg Oral Daily   diltiazem  240 mg Oral QHS   ezetimibe  10 mg Oral Daily   flecainide  100 mg Oral Q12H   insulin aspart  0-5 Units Subcutaneous QHS   insulin aspart  0-6 Units Subcutaneous TID WC   insulin detemir  8 Units Subcutaneous QHS   lisinopril  30 mg Oral Daily   [START ON 03/27/2021] mycophenolate  500 mg Oral BID   potassium chloride SA  20 mEq Oral TID   predniSONE  5 mg Oral Q breakfast   pregabalin  50 mg Oral BID   senna  1 tablet Oral BID   tacrolimus  3 mg Oral BID   cyanocobalamin  2,000 mcg Oral Daily   Continuous Infusions:  methocarbamol (ROBAXIN) IV       Radiological Exams on Admission: No results found.  Impression/Recommendations Active Problems:   Cervical myelopathy (HCC)  Cervical myelopathy with progressive symptoms status post ACDF C3-6 by neurosurgery Dr. Izora Ribas on 03/24/2021. Management per neurosurgery.  ESRD s/p bilateral renal transplant, on immunosuppressants Per primary, transplant team had recommended to hold off mycophenolate for 2 days and to continue prednisone and tacrolimus. Can thus resume mycophenolate today, which is ordered. Kidney function is at baseline. Did not receive stress-dose steroids perioperatively; appears to have tolerated surgery well.   Bilateral lower extremity edema, POA Trace lower extremity edema around ankles, present prior to surgery, exam not suggestive of DVT.  Dysphagia Possibly consequence of cervical collar, intubation. Primary team has ordered dexamethasone.  - monitor  History  hypokalemia Normokalemic here. Continue home potassium  Hypomagnesemia Resolved w/ supplementation  Paroxysmal A. fib on Eliquis Continue home flecainide, diltiazem Resume eliquis when safe to do so from neurosurgical perspective  Insulin-dependent type 2 diabetes with hyperlipidemia A1c shows excellent control continue insulin sliding scale Continue home levemir  Essential hypertension, BPs not at goal. BP wnl last check. Cont home lisinopril.  Hyperlipidemia Resumed home Lipitor and Zetia  Ambulatory dysfunction PT/OT, disposition per primary team  Diabetic neuropathy Continue home lyrica    Thank you for this consultation.  Our [facility] hospitalist team will follow the patient with you.   Time Spent:  20 minutes.  Desma Maxim M.D. [facility] 03/26/2021, 3:05 PM

## 2021-03-26 NOTE — Discharge Summary (Signed)
Physician Discharge Summary  Patient ID: Shawnya Wickel MRN: [id number] DOB/AGE: [birth year] 70 y.o.  Admit date: 03/24/2021 Discharge date: 03/27/2021  Admission Diagnoses: Cervical myelopathy  Discharge Diagnoses:  Active Problems:   Cervical myelopathy [facility])   Discharged Condition: good  Hospital Course: Ardath AVANT DAVIS is a 70 y.o hospitalized after C3-6 ACDF. Her interoperative course was uncomplicated and she was admitted for medical stabilization and therapy.   Consults:  Internal Medicine  Treatments: surgery: See above  Discharge Exam: Blood pressure (!) 153/79, pulse 68, temperature 98.3 F (36.8 C), temperature source Oral, resp. rate 20, height '5\' 1"'$  (1.549 m), weight 53 kg, SpO2 97 %. Doing well.  Voice strong. Neck soft. Incision c/d/I. 4+/5 BUE 5/5 BLE   Disposition: Discharge disposition: 01-Home or Self Care      Please note below - ok to restart apixaban (eliquis) on 04/07/2021.  Please restart Mycophenolate (cellcept) today 03/27/2021.   Discharge Instructions     Incentive spirometry RT   Complete by: As directed       Allergies as of 03/27/2021       Reactions   E-mycin [erythromycin] Swelling   And itching   Penicillins Swelling   Throat        Medication List     STOP taking these medications    Eliquis 5 MG Tabs tablet Generic drug: apixaban       TAKE these medications    atorvastatin 10 MG tablet Commonly known as: LIPITOR Take 10 mg by mouth daily.   cyanocobalamin 2000 MCG tablet Take 2,000 mcg by mouth daily.   diltiazem 240 MG 24 hr capsule Commonly known as: CARDIZEM CD Take 240 mg by mouth at bedtime.   DRY EYES OP Place 1 drop into both eyes daily as needed (blurry Vision or floater in eyes).   ezetimibe 10 MG tablet Commonly known as: ZETIA Take 10 mg by mouth daily.   flecainide 100 MG tablet Commonly known as: TAMBOCOR Take 100 mg by mouth every 12 (twelve) hours.   insulin detemir 100  UNIT/ML injection Commonly known as: LEVEMIR Inject 8 Units into the skin at bedtime.   insulin lispro 100 UNIT/ML KwikPen Commonly known as: HUMALOG Inject 0-5 Units into the skin in the morning and at bedtime. Sliding scale   lisinopril 30 MG tablet Commonly known as: ZESTRIL Take 30 mg by mouth daily.   methocarbamol 500 MG tablet Commonly known as: ROBAXIN Take 1 tablet (500 mg total) by mouth every 6 (six) hours as needed for muscle spasms.   mycophenolate 500 MG tablet Commonly known as: CELLCEPT Take 500 mg by mouth 2 (two) times daily.   oxyCODONE 5 MG immediate release tablet Commonly known as: Oxy IR/ROXICODONE Take 1 tablet (5 mg total) by mouth every 4 (four) hours as needed for up to 5 days for severe pain ((score 4 to 6)).   Potassium Chloride ER 20 MEQ Tbcr Take 20 mEq by mouth 3 (three) times daily.   predniSONE 5 MG tablet Commonly known as: DELTASONE Take 5 mg by mouth daily with breakfast.   pregabalin 25 MG capsule Commonly known as: LYRICA Take 50 mg by mouth 2 (two) times daily.   senna 8.6 MG Tabs tablet Commonly known as: SENOKOT Take 1 tablet (8.6 mg total) by mouth 2 (two) times daily.   tacrolimus 1 MG capsule Commonly known as: PROGRAF Take 3 mg by mouth 2 (two) times daily.  Durable Medical Equipment  (From admission, onward)           Start     Ordered   03/27/21 0808  For home use only DME 4 wheeled rolling walker with seat  Once       Question:  Patient needs a walker to treat with the following condition  Answer:  Cervical myelopathy ([place])   03/27/21 0808            Follow-up Information     Loleta Dicker, PA Follow up in 2 week(s).   Contact information: [place] [address] [phone]                 Signed: Meade Maw 03/27/2021, 8:10 AM

## 2021-03-26 NOTE — Care Management Important Message (Signed)
Important Message  Patient Details  Name: Kavina Lints MRN: [id number] Date of Birth: 25-Jan-1951   Medicare Important Message Given:  Yes     Dannette Barbara 03/26/2021, 11:59 AM

## 2021-03-26 NOTE — NC FL2 (Signed)
[place] LEVEL OF CARE SCREENING TOOL     IDENTIFICATION  Patient Name: Jean Johnson Birthdate: Mar 11, 1951 Sex: female Admission Date (Current Location): 03/24/2021  [place] and [place] Number:  Engineering geologist and Address:  [facility], [address] [place], [place] [address]      Provider Number: [id number]  Attending Physician Name and Address:  Meade Maw, MD  Relative Name and Phone Number:  [email or web address],roger (Spouse)   [phone] [facility] Phone)    Current Level of Care: Hospital Recommended Level of Care: [place] Prior Approval Number:    Date Approved/Denied:   PASRR Number: [id number]  Discharge Plan: SNF    Current Diagnoses: Patient Active Problem List   Diagnosis Date Noted   Cervical myelopathy ([place]) 03/24/2021   Anemia 12/03/2020   Essential hypertension 12/03/2020   Hypothyroidism 12/03/2020   Type 2 diabetes mellitus ([place]) 12/03/2020   Atrial fibrillation ([place]) 09/21/2018   Kidney transplant status 09/21/2018   Osteoporosis 09/21/2018    Orientation RESPIRATION BLADDER Height & Weight     Self, Time, Situation, Place  Normal Continent, External catheter Weight: 53 kg Height:  '5\' 1"'$  (154.9 cm)  BEHAVIORAL SYMPTOMS/MOOD NEUROLOGICAL BOWEL NUTRITION STATUS      Continent Diet (Carb modified 1600-2000 calories)  AMBULATORY STATUS COMMUNICATION OF NEEDS Skin   Extensive Assist Verbally Surgical wounds (neck (cervical) surgical incision, Liquid adhesive with guaze dressing in place)                       Personal Care Assistance Level of Assistance  Bathing, Feeding, Dressing Bathing Assistance: Limited assistance Feeding assistance: Limited assistance Dressing Assistance: Limited assistance     Functional Limitations Info  Sight, Hearing, Speech Sight Info: Adequate Hearing Info: Adequate Speech Info: Adequate    SPECIAL CARE FACTORS FREQUENCY  PT (By licensed  PT), OT (By licensed OT)     PT Frequency: Min 5x weekly OT Frequency: Min 5x weekly            Contractures Contractures Info: Not present    Additional Factors Info  Code Status, Allergies Code Status Info: Full Allergies Info: Emycin, penicillins           Current Medications (03/26/2021):  This is the current hospital active medication list Current Facility-Administered Medications  Medication Dose Route Frequency Provider Last Rate Last Admin   acetaminophen (TYLENOL) tablet 650 mg  650 mg Oral Q4H PRN Loleta Dicker, PA   650 mg at 03/25/21 1029   Or   acetaminophen (TYLENOL) suppository 650 mg  650 mg Rectal Q4H PRN Loleta Dicker, PA       acetaminophen (TYLENOL) tablet 650 mg  650 mg Oral Q6H PRN Irene Pap N, DO       atorvastatin (LIPITOR) tablet 10 mg  10 mg Oral Daily Loleta Dicker, PA   10 mg at 03/26/21 1013   bisacodyl (DULCOLAX) EC tablet 5 mg  5 mg Oral Daily PRN Loleta Dicker, PA       diltiazem (CARDIZEM CD) 24 hr capsule 240 mg  240 mg Oral QHS Loleta Dicker, [place]   240 mg at 03/25/21 2211   ezetimibe (ZETIA) tablet 10 mg  10 mg Oral Daily Loleta Dicker, PA   10 mg at 03/26/21 1015   flecainide (TAMBOCOR) tablet 100 mg  100 mg Oral Q12H Loleta Dicker, PA   100 mg at 03/26/21 1013  hydrALAZINE (APRESOLINE) injection 5 mg  5 mg Intravenous Q6H PRN Irene Pap N, DO       insulin aspart (novoLOG) injection 0-5 Units  0-5 Units Subcutaneous QHS Loleta Dicker, PA       insulin aspart (novoLOG) injection 0-6 Units  0-6 Units Subcutaneous TID WC Loleta Dicker, PA   1 Units at 03/25/21 1709   insulin detemir (LEVEMIR) injection 8 Units  8 Units Subcutaneous QHS Loleta Dicker, [place]   8 Units at 03/25/21 2211   lisinopril (ZESTRIL) tablet 30 mg  30 mg Oral Daily Loleta Dicker, PA   30 mg at 03/26/21 1012   melatonin tablet 2.5 mg  2.5 mg Oral QHS PRN Irene Pap N, DO   2.5 mg at 03/24/21 2109    menthol-cetylpyridinium (CEPACOL) lozenge 3 mg  1 lozenge Oral PRN Loleta Dicker, PA   3 mg at 03/25/21 [id number]   Or   phenol (CHLORASEPTIC) mouth spray 1 spray  1 spray Mouth/Throat PRN Loleta Dicker, PA       methocarbamol (ROBAXIN) tablet 500 mg  500 mg Oral Q6H PRN Loleta Dicker, PA   500 mg at 03/26/21 [id number]   Or   methocarbamol (ROBAXIN) 500 mg in dextrose 5 % 50 mL IVPB  500 mg Intravenous Q6H PRN Loleta Dicker, PA       [START ON 03/27/2021] mycophenolate (CELLCEPT) capsule 500 mg  500 mg Oral BID Loleta Dicker, PA       ondansetron [facility]) tablet 4 mg  4 mg Oral Q6H PRN Loleta Dicker, PA       Or   ondansetron [facility]) injection 4 mg  4 mg Intravenous Q6H PRN Loleta Dicker, PA       oxyCODONE (Oxy IR/ROXICODONE) immediate release tablet 10 mg  10 mg Oral Q4H PRN Loleta Dicker, PA   10 mg at 03/24/21 2109   oxyCODONE (Oxy IR/ROXICODONE) immediate release tablet 5 mg  5 mg Oral Q4H PRN Loleta Dicker, PA   5 mg at 03/26/21 0810   polyethylene glycol (MIRALAX / GLYCOLAX) packet 17 g  17 g Oral Daily PRN Loleta Dicker, PA       potassium chloride SA (KLOR-CON) CR tablet 20 mEq  20 mEq Oral TID Loleta Dicker, PA   20 mEq at 03/26/21 1015   predniSONE (DELTASONE) tablet 5 mg  5 mg Oral Q breakfast Loleta Dicker, PA   5 mg at 03/26/21 0810   pregabalin (LYRICA) capsule 50 mg  50 mg Oral BID Loleta Dicker, PA   50 mg at 03/26/21 1014   senna (SENOKOT) tablet 8.6 mg  1 tablet Oral BID Loleta Dicker, PA   8.6 mg at 03/26/21 1015   sodium phosphate (FLEET) 7-19 GM/118ML enema 1 enema  1 enema Rectal Once PRN Loleta Dicker, PA       tacrolimus (PROGRAF) capsule 3 mg  3 mg Oral BID Loleta Dicker, PA   3 mg at 03/26/21 0800   vitamin B-12 (CYANOCOBALAMIN) tablet 2,000 mcg  2,000 mcg Oral Daily Loleta Dicker, PA   2,000 mcg at 03/26/21 1014     Discharge Medications: Please see discharge summary for a list of discharge  medications.  Relevant Imaging Results:  Relevant Lab Results:   Additional Information SSN [id number] 2 diabetes, diabetic peripheral neuropathy, ESRD status post bilateral kidney transplant  Pete Pelt, RN

## 2021-03-26 NOTE — Progress Notes (Addendum)
Physical Therapy Treatment Patient Details Name: Jean Johnson MRN: [id number] DOB: 1951-01-18 Today's Date: 03/26/2021   History of Present Illness 70 y/o female s/p C3-6 ACDF for cervical myelopathy and radiculopathy 03/24/21.    PT Comments    Pt making slowed progress towards her therapy goals. Pt demonstrating impaired balance with scissoring gait pattern with turns and required minA intervention to prevent fall. Pt continues to demonstrate difficulty/pain with swallowing and MD notified. Recommending SNF placement to improve patient's balance prior to returning home. Case manager informed of updated recommendation. Pt will benefit from skilled PT services to improve balance and reduce fall risk.    Recommendations for follow up therapy are one component of a multi-disciplinary discharge planning process, led by the attending physician.  Recommendations may be updated based on patient status, additional functional criteria and insurance authorization.  Follow Up Recommendations  SNF;Supervision for mobility/OOB     Equipment Recommendations  Rolling walker with 5" wheels    Recommendations for Other Services       Precautions / Restrictions Precautions Precautions: Cervical Required Braces or Orthoses: Cervical Brace Cervical Brace: Hard collar Restrictions Weight Bearing Restrictions: No     Mobility  Bed Mobility    General bed mobility comments: Pt received in chair so bed mobility not assessed    Transfers Overall transfer level: Needs assistance Equipment used: Rolling walker (2 wheeled) Transfers: Sit to/from Stand Sit to Stand: Min guard         General transfer comment: CGA for initial standing for balance  Ambulation/Gait Ambulation/Gait assistance: Min assist Gait Distance (Feet): 50 Feet Assistive device: Rolling walker (2 wheeled) Gait Pattern/deviations: Step-to pattern;Decreased step length - right;Decreased step length - left Gait  velocity: slow   General Gait Details: Pt demonstrating tendency to cross her feet during turns requiring minA to correct balance with usage of RW. Pt ambulating very slow and cautiously.   Stairs             Wheelchair Mobility    Modified Rankin (Stroke Patients Only)       Balance Overall balance assessment: Needs assistance Sitting-balance support: Feet supported Sitting balance-Leahy Scale: Good     Standing balance support: During functional activity;Bilateral upper extremity supported Standing balance-Leahy Scale: Poor Standing balance comment: B UE support on walker + assistance for correction of 1 LOB during ambulation                            Cognition Arousal/Alertness: Awake/alert Behavior During Therapy: WFL for tasks assessed/performed Overall Cognitive Status: Within Functional Limits for tasks assessed                                 General Comments: Pt is pleasant and cooperative throughout      Exercises      General Comments        Pertinent Vitals/Pain Pain Assessment: No/denies pain    Home Living                      Prior Function            PT Goals (current goals can now be found in the care plan section) Acute Rehab PT Goals Patient Stated Goal: Go home PT Goal Formulation: With patient Time For Goal Achievement: 04/08/21 Potential to Achieve Goals: Good Progress towards PT goals: Progressing toward  goals    Frequency    7X/week      PT Plan Discharge plan needs to be updated    Co-evaluation              AM-PAC PT "6 Clicks" Mobility   Outcome Measure  Help needed turning from your back to your side while in a flat bed without using bedrails?: A Little Help needed moving from lying on your back to sitting on the side of a flat bed without using bedrails?: A Little Help needed moving to and from a bed to a chair (including a wheelchair)?: A Little Help needed standing  up from a chair using your arms (e.g., wheelchair or bedside chair)?: A Little Help needed to walk in hospital room?: A Little Help needed climbing 3-5 steps with a railing? : A Lot 6 Click Score: 17    End of Session Equipment Utilized During Treatment: Gait belt;Cervical collar Activity Tolerance: Patient tolerated treatment well Patient left: in chair;with call bell/phone within reach;with chair alarm set Nurse Communication: Mobility status PT Visit Diagnosis: Muscle weakness (generalized) (M62.81);Unsteadiness on feet (R26.81);Difficulty in walking, not elsewhere classified (R26.2);Other symptoms and signs involving the nervous system [id number])     Time: [id number] PT Time Calculation (min) (ACUTE ONLY): 16 min  Charges:                       Andrey Campanile, SPT   Andrey Campanile 03/26/2021, 4:02 PM

## 2021-03-26 NOTE — TOC Progression Note (Signed)
Transition of Care [facility]) - Progression Note    Patient Details  Name: Jean Johnson MRN: [id number] Date of Birth: 10-06-1950  [facility]) CM/SW [place], RN Phone Number: 03/26/2021, 4:01 PM  Clinical Narrative:   Bed search started in local [place] for STR.         Expected Discharge Plan and Services                                                 Social Determinants of Health (SDOH) Interventions    Readmission Risk Interventions No flowsheet data found.

## 2021-03-26 NOTE — Discharge Instructions (Signed)
Your surgeon has performed an operation on your cervical spine (neck) to relieve pressure on the spinal cord and/or nerves. This involved making an incision in the front of your neck and removing one or more of the discs that support your spine. Next, a small piece of bone, a titanium plate, and screws were used to fuse two or more of the vertebrae (bones) together.  The following are instructions to help in your recovery once you have been discharged from the hospital. Even if you feel well, it is important that you follow these activity guidelines. If you do not let your neck heal properly from the surgery, you can increase the chance of return of your symptoms and other complications.  * Do not take anti-inflammatory medications for 3 months after surgery (naproxen [Aleve], ibuprofen [Advil, Motrin], celecoxib [Celebrex], etc.). These medications can prevent your bones from healing properly. *Please resume home Eliquis 14 days from the date of surgery *Please resume Cellcept on 9/17  Activity    No bending, lifting, or twisting ("BLT"). Avoid lifting objects heavier than 10 pounds (gallon milk jug).  Where possible, avoid household activities that involve lifting, bending, reaching, pushing, or pulling such as laundry, vacuuming, grocery shopping, and childcare. Try to arrange for help from friends and family for these activities while your back heals.  Increase physical activity slowly as tolerated.  Taking short walks is encouraged, but avoid strenuous exercise. Do not jog, run, bicycle, lift weights, or participate in any other exercises unless specifically allowed by your doctor.  Talk to your doctor before resuming sexual activity.  You should not drive until cleared by your doctor.  Until released by your doctor, you should not return to work or school.  You should rest at home and let your body heal.   You may shower three days after your surgery.  After showering, lightly dab your  incision dry. Do not take a tub bath or go swimming until approved by your doctor at your follow-up appointment.  If your doctor ordered a cervical collar (neck brace) for you, you should wear it whenever you are out of bed. You may remove it when lying down or sleeping, but you should wear it at all other times. Not all neck surgeries require a cervical collar.  If you smoke, we strongly recommend that you quit.  Smoking has been proven to interfere with normal bone healing and will dramatically reduce the success rate of your surgery. Please contact [facility] (800-QUIT-NOW) and use the resources at www.[facility].com for assistance in stopping smoking.  Surgical Incision   If you have a dressing on your incision, you may remove it two days after your surgery. Keep your incision area clean and dry.  If you have staples or stitches on your incision, you should have a follow up scheduled for removal. If you do not have staples or stitches, you will have steri-strips (small pieces of surgical tape) or Dermabond glue. The steri-strips/glue should begin to peel away within about a week (it is fine if the steri-strips fall off before then). If the strips are still in place one week after your surgery, you may gently remove them.  Diet           You may return to your usual diet. However, you may experience discomfort when swallowing in the first month after your surgery. This is normal. You may find that softer foods are more comfortable for you to swallow. Be sure to stay hydrated.  When  to Contact us  You may experience pain in your neck and/or pain between your shoulder blades. This is normal and should improve in the next few weeks with the help of pain medication, muscle relaxers, and rest. Some patients report that a warm compress on the back of the neck or between the shoulder blades helps.  However, should you experience any of the following, contact us immediately: New numbness or  weakness Pain that is progressively getting worse, and is not relieved by your pain medication, muscle relaxers, rest, and warm compresses Bleeding, redness, swelling, pain, or drainage from surgical incision Chills or flu-like symptoms Fever greater than 101.0 F (38.3 C) Inability to eat, drink fluids, or take medications Problems with bowel or bladder functions Difficulty breathing or shortness of breath Warmth, tenderness, or swelling in your calf Contact Information During office hours (Monday-Friday 9 am to 5 pm), please call your physician at [phone] and ask for Berdine Addison After hours and weekends, please call [phone] and speak with the answering service, who will contact the doctor on call.  If that fails, call the [place] Operator at [phone] and ask for the Neurosurgery Resident On Call  For a life-threatening emergency, call 911

## 2021-03-26 NOTE — Progress Notes (Signed)
    Attending Progress Note  History: Jean Johnson is POD#2 from C3-6 ACDF for cervical myelopathy and radiculopathy.  POD#2: She reports generalized fatigue and continued dysphagia however she is tolerating breakfast this morning  Physical Exam: Vitals:   03/25/21 2214 03/26/21 0835  BP: (!) 146/60 (!) 170/59  Pulse: 75 65  Resp: 17 15  Temp: 98 F (36.7 C) 98.1 F (36.7 C)  SpO2:  100%    AA Ox3 CNI 4/5 throughout BUE. 5/5 throughout BLE Incision with Dermabond in place. HV output was not measured overnight Data:  Recent Labs  Lab 03/25/21 0243 03/26/21 0349  NA 138 137  K 4.5 5.0  CL 108 109  CO2 24 24  BUN 16 27*  CREATININE 1.02* 1.06*  GLUCOSE 207* 109*  CALCIUM 8.9 9.5    Recent Labs  Lab 03/25/21 0243  AST 20  ALT 11  ALKPHOS 78      Recent Labs  Lab 03/25/21 0243  WBC 11.2*  HGB 12.6  HCT 38.1  PLT 191    No results for input(s): APTT, INR in the last 168 hours.         Assessment/Plan:  Jean Johnson is a 70 y.o with a medical history of  ESRD s/p bilateral renal transplant, A Fib on Eliquis, type 2 DM, HTN, and HLD, s/p C3-6 ACDF cervical myelopathy and radiculopathy.  - mobilize - pain control - DVT prophylaxis; SCDs. Holding chemical DVT prophylaxis at this time due to drain output. - PTOT; already has home health services established for discharge. -We will monitor Hemovac output this morning and consider removal this afternoon. - '4mg'$  IV X Methasone x1 for dysphagia - c-collar when out of bed. -We appreciate medicine's assistance with additional medical management. - [place]  Department of Neurosurgery

## 2021-03-26 NOTE — Progress Notes (Signed)
Occupational Therapy Treatment Patient Details Name: Jinnifer Bilyeu MRN: [id number] DOB: 01-03-1951 Today's Date: 03/26/2021   History of present illness 70 y/o female s/p C3-6 ACDF for cervical myelopathy and radiculopathy 03/24/21.   OT comments  Upon entering the room, pt reports not feeling well but requesting assistance for toileting. Pt performing bed mobility without assistance but needing increased time to complete task. Pt standing and ambulating into bathroom with close supervision and use of RW. Pt able to void and perform her own hygiene without assistance. Hand hygiene while standing at sink with supervision and then pt ambulates to recliner chair. OT demonstrates and then has pt return demonstrations to change her hard collar pads this session. Hard collar donned while pt in recliner chair and all needs within reach. Pt continues to benefit from OT intervention.    Recommendations for follow up therapy are one component of a multi-disciplinary discharge planning process, led by the attending physician.  Recommendations may be updated based on patient status, additional functional criteria and insurance authorization.    Follow Up Recommendations  Home health OT;Supervision - Intermittent    Equipment Recommendations  None recommended by OT       Precautions / Restrictions Precautions Precautions: Cervical Required Braces or Orthoses: Cervical Brace Cervical Brace: Hard collar Restrictions Weight Bearing Restrictions: No       Mobility Bed Mobility Overal bed mobility: Modified Independent             General bed mobility comments: no physical assistance but pt needing extra time to complete task    Transfers Overall transfer level: Needs assistance Equipment used: Rolling walker (2 wheeled) Transfers: Sit to/from [facility] Sit to Stand: Min guard Stand pivot transfers: Min guard            Balance Overall balance assessment:  Needs assistance Sitting-balance support: Feet supported Sitting balance-Leahy Scale: Good     Standing balance support: During functional activity Standing balance-Leahy Scale: Fair Standing balance comment: UE support                           ADL either performed or assessed with clinical judgement   ADL Overall ADL's : Needs assistance/impaired     Grooming: Wash/dry hands;Wash/dry face;Standing;Min guard                   Armed forces technical officer: Min guard;Ambulation;RW;Comfort height toilet   Toileting- Clothing Manipulation and Hygiene: Min guard;Sit to/from stand       Functional mobility during ADLs: Min guard;Rolling walker       Vision Patient Visual Report: No change from baseline            Cognition Arousal/Alertness: Awake/alert Behavior During Therapy: WFL for tasks assessed/performed Overall Cognitive Status: Within Functional Limits for tasks assessed                                 General Comments: Pt is pleasant and cooperative throughout                   Pertinent Vitals/ Pain       Pain Assessment: Faces Faces Pain Scale: Hurts a little bit Pain Location: anterior neck Pain Descriptors / Indicators: Aching;Discomfort Pain Intervention(s): Monitored during session;Repositioned;Premedicated before session         Frequency  Min 2X/week  Progress Toward Goals  OT Goals(current goals can now be found in the care plan section)  Progress towards OT goals: Progressing toward goals  Acute Rehab OT Goals Patient Stated Goal: Go home OT Goal Formulation: With patient Time For Goal Achievement: 04/08/21 Potential to Achieve Goals: Good  Plan Discharge plan remains appropriate;Frequency remains appropriate       AM-PAC OT "6 Clicks" Daily Activity     Outcome Measure   Help from another person eating meals?: None Help from another person taking care of personal grooming?: A Little Help from  another person toileting, which includes using toliet, bedpan, or urinal?: A Little Help from another person bathing (including washing, rinsing, drying)?: A Little Help from another person to put on and taking off regular upper body clothing?: A Little Help from another person to put on and taking off regular lower body clothing?: A Little 6 Click Score: 19    End of Session Equipment Utilized During Treatment: Rolling walker  OT Visit Diagnosis: Unsteadiness on feet (R26.81);Muscle weakness (generalized) (M62.81)   Activity Tolerance Patient tolerated treatment well   Patient Left with call bell/phone within reach;in chair;with chair alarm set   Nurse Communication          Time: [id number] OT Time Calculation (min): 28 min  Charges: OT General Charges $OT Visit: 1 Visit OT Treatments $Self Care/Home Management : 8-22 mins $Therapeutic Activity: 8-22 mins  Darleen Crocker, MS, OTR/L , CBIS ascom [phone]  03/26/21, 12:56 PM

## 2021-03-27 LAB — BASIC METABOLIC PANEL
Anion gap: 4 — ABNORMAL LOW (ref 5–15)
BUN: 35 mg/dL — ABNORMAL HIGH (ref 8–23)
CO2: 23 mmol/L (ref 22–32)
Calcium: 9.3 mg/dL (ref 8.9–10.3)
Chloride: 111 mmol/L (ref 98–111)
Creatinine, Ser: 1.13 mg/dL — ABNORMAL HIGH (ref 0.44–1.00)
GFR, Estimated: 52 mL/min — ABNORMAL LOW (ref >60)
Glucose, Bld: 116 mg/dL — ABNORMAL HIGH (ref 70–99)
Potassium: 4.9 mmol/L (ref 3.5–5.1)
Sodium: 138 mmol/L (ref 135–145)

## 2021-03-27 LAB — GLUCOSE, CAPILLARY
Glucose-Capillary: 121 mg/dL — ABNORMAL HIGH (ref 70–99)
Glucose-Capillary: 108 mg/dL — ABNORMAL HIGH (ref 70–99)
Glucose-Capillary: 130 mg/dL — ABNORMAL HIGH (ref 70–99)

## 2021-03-27 MED ORDER — METHOCARBAMOL 500 MG PO TABS: 500.0000 mg | ORAL_TABLET | Freq: Four times a day (QID) | ORAL | 0 refills | Status: DC | PRN

## 2021-03-27 MED ORDER — OXYCODONE HCL 5 MG PO TABS: 5.0000 mg | ORAL_TABLET | ORAL | 0 refills | Status: AC | PRN

## 2021-03-27 MED ORDER — SENNA 8.6 MG PO TABS: 1.0000 | ORAL_TABLET | Freq: Two times a day (BID) | ORAL | 0 refills | Status: DC

## 2021-03-27 NOTE — Plan of Care (Signed)
  Problem: Education: Goal: Knowledge of General Education information will improve Description: Including pain rating scale, medication(s)/side effects and non-pharmacologic comfort measures 03/27/2021 1535 by Jules Schick, RN Outcome: Completed/Met 03/27/2021 0955 by Jules Schick, RN Outcome: Progressing 03/27/2021 0954 by Jules Schick, RN Outcome: Progressing   Problem: Health Behavior/Discharge Planning: Goal: Ability to manage health-related needs will improve 03/27/2021 1535 by Jules Schick, RN Outcome: Completed/Met 03/27/2021 0955 by Jules Schick, RN Outcome: Progressing 03/27/2021 0954 by Jules Schick, RN Outcome: Progressing   Problem: Clinical Measurements: Goal: Ability to maintain clinical measurements within normal limits will improve 03/27/2021 1535 by Jules Schick, RN Outcome: Completed/Met 03/27/2021 0955 by Jules Schick, RN Outcome: Progressing 03/27/2021 0954 by Jules Schick, RN Outcome: Progressing Goal: Will remain free from infection 03/27/2021 1535 by Jules Schick, RN Outcome: Completed/Met 03/27/2021 0955 by Jules Schick, RN Outcome: Progressing 03/27/2021 0954 by Jules Schick, RN Outcome: Progressing Goal: Diagnostic test results will improve 03/27/2021 1535 by Jules Schick, RN Outcome: Completed/Met 03/27/2021 0955 by Jules Schick, RN Outcome: Progressing 03/27/2021 0954 by Jules Schick, RN Outcome: Progressing Goal: Respiratory complications will improve 03/27/2021 1535 by Jules Schick, RN Outcome: Completed/Met 03/27/2021 0955 by Jules Schick, RN Outcome: Progressing 03/27/2021 0954 by Jules Schick, RN Outcome: Progressing Goal: Cardiovascular complication will be avoided 03/27/2021 1535 by Jules Schick, RN Outcome: Completed/Met 03/27/2021 0955 by Jules Schick, RN Outcome: Progressing 03/27/2021 0954 by Jules Schick, RN Outcome: Progressing    Problem: Activity: Goal: Risk for activity intolerance will decrease 03/27/2021 1535 by Jules Schick, RN Outcome: Completed/Met 03/27/2021 0955 by Jules Schick, RN Outcome: Progressing 03/27/2021 0954 by Jules Schick, RN Outcome: Progressing   Problem: Nutrition: Goal: Adequate nutrition will be maintained 03/27/2021 1535 by Jules Schick, RN Outcome: Completed/Met 03/27/2021 0955 by Jules Schick, RN Outcome: Progressing 03/27/2021 0954 by Jules Schick, RN Outcome: Progressing   Problem: Coping: Goal: Level of anxiety will decrease 03/27/2021 1535 by Jules Schick, RN Outcome: Completed/Met 03/27/2021 0955 by Jules Schick, RN Outcome: Progressing 03/27/2021 0954 by Jules Schick, RN Outcome: Progressing   Problem: Elimination: Goal: Will not experience complications related to bowel motility 03/27/2021 1535 by Jules Schick, RN Outcome: Completed/Met 03/27/2021 0955 by Jules Schick, RN Outcome: Progressing 03/27/2021 0954 by Jules Schick, RN Outcome: Progressing Goal: Will not experience complications related to urinary retention 03/27/2021 1535 by Jules Schick, RN Outcome: Completed/Met 03/27/2021 0955 by Jules Schick, RN Outcome: Progressing 03/27/2021 0954 by Jules Schick, RN Outcome: Progressing   Problem: Pain Managment: Goal: General experience of comfort will improve 03/27/2021 1535 by Jules Schick, RN Outcome: Completed/Met 03/27/2021 0955 by Jules Schick, RN Outcome: Progressing 03/27/2021 0954 by Jules Schick, RN Outcome: Progressing   Problem: Safety: Goal: Ability to remain free from injury will improve 03/27/2021 1535 by Jules Schick, RN Outcome: Completed/Met 03/27/2021 0955 by Jules Schick, RN Outcome: Progressing 03/27/2021 0954 by Jules Schick, RN Outcome: Progressing   Problem: Skin Integrity: Goal: Risk for impaired skin integrity will decrease 03/27/2021  1535 by Jules Schick, RN Outcome: Completed/Met 03/27/2021 [place] by Jules Schick, RN Outcome: Progressing 03/27/2021 [place] by Jules Schick, RN Outcome: Progressing

## 2021-03-27 NOTE — Progress Notes (Addendum)
   03/27/21 0346  What Happened  Was fall witnessed? No  Was patient injured? No  Patient found in bathroom  Found by Staff-comment  Stated prior activity other (comment) (assis[place] to bathroom)  Follow Up  MD notified yes  Time MD notified [phone]  Family notified Yes - comment Chapman Moss (husband))  Time family notified [phone]  Additional tests No  Progress note created (see row info) Yes  Adult Fall Risk Assessment  Risk Factor Category (scoring not indicated) High fall risk per protocol (document High fall risk)  Patient Fall Risk Level High fall risk  Adult Fall Risk Interventions  Required Bundle Interventions *See Row Information* High fall risk - low, moderate, and high requirements implemented  Additional Interventions Use of appropriate toileting equipment (bedpan, BSC, etc.)  Screening for Fall Injury Risk (To be completed on HIGH fall risk patients) - Assessing Need for Floor Mats  Risk For Fall Injury- Criteria for Floor Mats None identified - No additional interventions needed  Vitals  Temp 98 F (36.7 C)  Temp Source Oral  BP (!) 183/64  MAP (mmHg) 99  BP Location Right Arm  BP Method Automatic  Patient Position (if appropriate) Lying  Pulse Rate 74  Pulse Rate Source Monitor  Resp 20  Oxygen Therapy  SpO2 100 %  O2 Device Room Air  Pain Assessment  Pain Scale 0-10  Pain Score 0  Neurological  Neuro (WDL) WDL  Level of Consciousness Alert  Orientation Level Oriented X4  Motor Function/Sensation Assessment Grip  R Hand Grip Present;Moderate  L Hand Grip Present;Moderate   RUE Motor Response Purposeful movement  LUE Motor Response Purposeful movement  RLE Motor Response Purposeful movement  LLE Motor Response Purposeful movement  Integumentary  Integumentary (WDL) X  Skin Color Appropriate for ethnicity  Skin Condition Dry  Skin Integrity Surgical Incision (see LDA)  Skin Turgor Non-tenting  Staff assisted patient to bathroom to use the toilet and  brush her teeth. while in the bathroom Patient states she dropped her toothbrush and when she bent down to pick the toothbrush up she lost her balance and fell to the floor. No injuries were observed. Patient was safely assisted back to bed. Vital signs were taken. MD notified no new orders at this time.Call bell placed within patients reach, bed alarm activated. Will continue to monitor.

## 2021-03-27 NOTE — Progress Notes (Signed)
Pt discharged home with husband, all instructions provided with questions answered.   BP (!) 157/61 (BP Location: Right Arm)   Pulse 65   Temp 98.2 F (36.8 C)   Resp 16   Ht '5\' 1"'$  (1.549 m)   Wt 53 kg   SpO2 95%   BMI 22.09 kg/m

## 2021-03-27 NOTE — TOC Transition Note (Signed)
Transition of Care [facility]) - CM/SW Discharge Note   Patient Details  Name: Jean Johnson MRN: [id number] Date of Birth: March 27, 1951  Transition of Care [facility]) CM/SW Contact:  Izola Price, RN Phone Number: 03/27/2021, 10:34 AM   Clinical Narrative: Final disposition plan discussed with patient:  PT re-evaluation remained SNF, but patient wishes to go home with [facility] and husband as 24 hour supervision/assistance with mobility.  PT cautioned patient to sleep in recliner downstairs until stronger and seen by [facility] PT for safety on stairs.  DME rolling walker with seat ordered via [place] via [place], and is to be delivered to patient's room around 2-3 pm today.  HH via [facility] and verified with Glennis Brink by phone.  Patient stated again, as in prior CM notes that husband is able to be with her full time for supervision/assistance, transportation needs, and she has not issues [facility].  Reinforced concerns about not going upstairs till [facility] PT evaluates and she is stronger as she was only able to perform 2-3 steps with PT in acute setting at [facility].   Patient repeated this back to RN CM.  Patient has a new PCP appointment on 10/12 that surgery provider is aware of but she could not remember the name.   Patient had no further questions or concerns regarding discharge plan, and has RN CM number if any arise prior to discharge today.  Simmie Davies RN CM    Final next level of care: Home w Home Health Services Barriers to Discharge: Barriers Resolved   Patient Goals and CMS Choice        Discharge Placement                    Patient and family notified of of transfer: 03/27/21 (Spoke with patient regarding changes in discharge plan and any DME needs. Husband will transport home.)  Discharge Plan and Services                DME Arranged: Walker rolling with seat DME Agency: [employer] Date DME Agency Contacted: 03/27/21 Time DME Agency  Contacted: 1032 Representative spoke with at DME Agency: [place] Arranged: OT, PT [place] Agency: [place] Date [place]: 03/27/21 Time [place]: 1033 Representative spoke with at [place]: Glennis Brink [phone]  Social Determinants of Health ([place]) Interventions     Readmission Risk Interventions No flowsheet data found.

## 2021-03-27 NOTE — Progress Notes (Deleted)
Staff assisted patient to bathroom to use the toilet and brush her teeth. while in the bathroom Patient states she dropped her toothbrush and when she bent down to pick the toothbrush up she lost her balance and fell to the floor. No injuries were observed. Patient was safely assisted back to bed. Vital signs were taken. MD notified no new orders at this time.Call bell placed within patients reach, bed alarm activated. Will continue to monitor.

## 2021-03-27 NOTE — Progress Notes (Signed)
Physical Therapy Treatment Patient Details Name: Jean Johnson MRN: [id number] DOB: April 23, 1951 Today's Date: 03/27/2021   History of Present Illness 70 y/o female s/p C3-6 ACDF for cervical myelopathy and radiculopathy 03/24/21.    PT Comments   Pt ready for session.  She has neck brace removed to eat and was assisted in putting it back on and instructed on how to do so.  She is able to get to EOB with supervision.  Stood to [employer] and min guard.  She is able to walk/ to /from PT gym for stair training.  She uses L rail and SPC to go up/down 8 steps.  Pt has bedroom upstairs at home but could sleep in recliner if needed.  She is fatigued from steps and requires seated rest prior to walking back to room.     Pt stated she would be discharging back home today.  Stated her husband has good mobility and her sister in law would also be in to help.  Stressed no independent gait at home and she voiced understanding.  She is given a gait belt for home use and instructed on how to use it and placed in her personal bags per her request.  While pt is progressing towards goals, she continues to require +1 assist for mobility and safety.  She voices understanding.  SNF is still recommended given fall risk.  Discussed with team as at end of session pt stated she would like to go home vs SNF.  She will benefit from a RW and HHPT/OT as available to her along with +1 assist at all times for mobility.     Recommendations for follow up therapy are one component of a multi-disciplinary discharge planning process, led by the attending physician.  Recommendations may be updated based on patient status, additional functional criteria and insurance authorization.  Follow Up Recommendations  SNF;Supervision for mobility/OOB     Equipment Recommendations  Rolling walker with 5" wheels    Recommendations for Other Services       Precautions / Restrictions Precautions Precautions: Cervical Required Braces or  Orthoses: Cervical Brace Cervical Brace: Hard collar Restrictions Weight Bearing Restrictions: No     Mobility  Bed Mobility Overal bed mobility: Modified Independent                  Transfers Overall transfer level: Needs assistance Equipment used: Rolling walker (2 wheeled) Transfers: Sit to/from Stand Sit to Stand: Min guard            Ambulation/Gait Ambulation/Gait assistance: Min Web designer (Feet): 100 Feet Assistive device: Rolling walker (2 wheeled) Gait Pattern/deviations: Step-to pattern;Decreased step length - right;Decreased step length - left Gait velocity: slow       Stairs Stairs: Yes Stairs assistance: Min assist Stair Management: One rail Left;With cane Number of Stairs: 8 General stair comments: +1 assist for safety - has full flight at home   Wheelchair Mobility    Modified Rankin (Stroke Patients Only)       Balance Overall balance assessment: Needs assistance Sitting-balance support: Feet supported Sitting balance-Leahy Scale: Good     Standing balance support: During functional activity;Bilateral upper extremity supported Standing balance-Leahy Scale: Poor                              Cognition Arousal/Alertness: Awake/alert Behavior During Therapy: WFL for tasks assessed/performed Overall Cognitive Status: Within Functional Limits for tasks assessed  Exercises      General Comments        Pertinent Vitals/Pain Pain Assessment: No/denies pain    Home Living                      Prior Function            PT Goals (current goals can now be found in the care plan section) Progress towards PT goals: Progressing toward goals    Frequency    7X/week      PT Plan Current plan remains appropriate    Co-evaluation              AM-PAC PT "6 Clicks" Mobility   Outcome Measure  Help needed turning from your back  to your side while in a flat bed without using bedrails?: A Little Help needed moving from lying on your back to sitting on the side of a flat bed without using bedrails?: A Little Help needed moving to and from a bed to a chair (including a wheelchair)?: A Little Help needed standing up from a chair using your arms (e.g., wheelchair or bedside chair)?: A Little Help needed to walk in hospital room?: A Little Help needed climbing 3-5 steps with a railing? : A Little 6 Click Score: 18    End of Session Equipment Utilized During Treatment: Gait belt;Cervical collar Activity Tolerance: Patient tolerated treatment well Patient left: in chair;with call bell/phone within reach;with chair alarm set Nurse Communication: Mobility status PT Visit Diagnosis: Muscle weakness (generalized) (M62.81);Unsteadiness on feet (R26.81);Difficulty in walking, not elsewhere classified (R26.2);Other symptoms and signs involving the nervous system [id number])     Time: [id number] PT Time Calculation (min) (ACUTE ONLY): 17 min  Charges:  $Gait Training: 8-22 mins                    Chesley Noon, PTA 03/27/21, 10:14 AM

## 2021-03-27 NOTE — Plan of Care (Signed)

## 2021-03-27 NOTE — Progress Notes (Signed)
    Attending Progress Note  History: Jean Johnson is POD#3 from C3-6 ACDF for cervical myelopathy and radiculopathy.   POD#3: Doing better every day.  Slipped while trying to pick up her toothbrush.  Swallowing and voice improved.  POD#2: She reports generalized fatigue and continued dysphagia however she is tolerating breakfast this morning  Physical Exam: Vitals:   03/27/21 0346 03/27/21 0535  BP: (!) 183/64 (!) 153/79  Pulse: 74 68  Resp: 20 20  Temp: 98 F (36.7 C) 98.3 F (36.8 C)  SpO2: 100% 97%    AA Ox3 CNI 4+/5 throughout BUE. 5/5 throughout BLE Incision with Dermabond in place.   Data:  Recent Labs  Lab 03/25/21 0243 03/26/21 0349 03/27/21 0510  NA 138 137 138  K 4.5 5.0 4.9  CL 108 109 111  CO2 '24 24 23  '$ BUN 16 27* 35*  CREATININE 1.02* 1.06* 1.13*  GLUCOSE 207* 109* 116*  CALCIUM 8.9 9.5 9.3   Recent Labs  Lab 03/25/21 0243  AST 20  ALT 11  ALKPHOS 78     Recent Labs  Lab 03/25/21 0243  WBC 11.2*  HGB 12.6  HCT 38.1  PLT 191   No results for input(s): APTT, INR in the last 168 hours.         Assessment/Plan:  Jean Johnson is a 70 y.o with a medical history of  ESRD s/p bilateral renal transplant, A Fib on Eliquis, type 2 DM, HTN, and HLD, s/p C3-6 ACDF cervical myelopathy and radiculopathy.  - mobilize - pain control - DVT prophylaxis; SCDs.  - PTOT; already has home health services established for discharge. - c-collar when out of bed. -We appreciate medicine's assistance with additional medical management. - dispo planning   Meade Maw MD Department of Neurosurgery

## 2021-03-27 NOTE — TOC Initial Note (Addendum)
Transition of Care [facility]) - Initial/Assessment Note    Patient Details  Name: Jean Johnson MRN: [id number] Date of Birth: 1950/08/06  Transition of Care [facility]) CM/SW Contact:    Izola Price, RN Phone Number: 03/27/2021, 10:44 AM  Clinical Narrative:  Per prior CM notes and today's discussion with patient by RN CM informed this assessment note: Patient lives with husband who is available for 24 assistance/supervision for mobility/balance issues noted.  Spouse also provides transportation to appointments, medications and other needs.  Patient does have a bedroom upstairs but will sleep downstairs in recliner until stronger/HH PT evaluates/treats.  Has her father's rollator at home for downstairs, no carpet use.  Requests one for upstairs when she is ready to go back upstairs and a bathroom is near bedroom upstairs--she has not had one purchased via Medicare before.  Patient stated she has a new PCP with an appointment on 10/12 but was unable to remember name at this time. Prior PCP listed is Dr. Lyla Son.  Chapman Moss Spouse)  [phone] [facility] Phone)  Home address and contact information in EHR confirmed.  [id number] [place] [facility] [address]       Barbie Massiel Stipp RN CM    1230 pm update. [employer] was unable to successfully run patient's Medicare for rolling walker with seat for upstairs when patient is able to manage stairs. Jasmine will recheck Sunday as she can only access several times. Relayed this information to patient and she was satisfied with home delivery. Gave her company information and verified to her knowledge Medicare is active as does her chart information. Jasmine will let RN CM know or reach out directly to patient at home. BSimmie DaviesRN CM    Barriers to Discharge: Barriers Resolved   Patient Goals and CMS Choice        Expected Discharge Plan and Services           Expected Discharge Date: 03/27/21               DME Arranged: WGilford Rile rolling with seat DME Agency: RFranklin ResourcesDate DME Agency Contacted: 03/27/21 Time DME Agency Contacted: 1032 Representative spoke with at DME Agency: JWadsworthHTaylorArranged: OT, PT [place]Agency: [place] 03/27/21 Time [place] 1033 Representative spoke with at [place] KGlennis [phone] Prior Living Arrangements/Services                       Activities of Daily Living Home Assistive Devices/Equipment: CKasandra Knudsen(specify quad or straight), CBG Meter, Eyeglasses, Walker (specify type), Wheelchair, Shower chair with back ADL Screening (condition at time of admission) Patient's cognitive ability adequate to safely complete daily activities?: Yes Is the patient deaf or have difficulty hearing?: No Does the patient have difficulty seeing, even when wearing glasses/contacts?: No Does the patient have difficulty concentrating, remembering, or making decisions?: No Patient able to express need for assistance with ADLs?: Yes Does the patient have difficulty dressing or bathing?: No Independently performs ADLs?: Yes (appropriate for developmental age) Does the patient have difficulty walking or climbing stairs?: Yes Weakness of Legs: Both Weakness of Arms/Hands: Right  Permission Sought/Granted                  Emotional Assessment              Admission diagnosis:  Cervical myelopathy ([place] [G95.9] Patient Active Problem List  Diagnosis Date Noted   Cervical myelopathy ([place]) 03/24/2021   Anemia 12/03/2020   Essential hypertension 12/03/2020   Hypothyroidism 12/03/2020   Type 2 diabetes mellitus ([place]) 12/03/2020   Atrial fibrillation ([place]) 09/21/2018   Kidney transplant status 09/21/2018   Osteoporosis 09/21/2018   PCP:  Lyla Son, MD Pharmacy[facility]   [id number] [place] [address]: [phone]:  [phone]    Social Determinants of Health (SDOH) Interventions    Readmission Risk Interventions No flowsheet data found.

## 2021-03-27 NOTE — TOC Progression Note (Signed)
Transition of Care [facility]) - Progression Note    Patient Details  Name: Jean Johnson MRN: [id number] Date of Birth: [birth year]  [facility]) CM/SW Contact  Izola Price, RN Phone Number: 03/27/2021, 9:27 AM  Clinical Narrative:  03/27/21:  Patient has discharge orders to Home/Self care but with SNF placement pending.  Patient also has a progress note from RN indicating she lost her balance and fell trying to pick up her toothbrush after dropping it.  Had been set up with [facility] on 9/15 as husband is at home per prior CM notes.  Patient has history of balance issues, with 9/16 PT notes indicated "impaired balance with scissoring gait patterns with minA assist to prevent fall".  Notified provider for clarification on disposition/ discharge plan and provider requested another PT evaluation to be done for final disposition/safe discharge plan. Will monitor for final plan today.  Simmie Davies RN CM        Expected Discharge Plan and Services           Expected Discharge Date: 03/27/21                                     Social Determinants of Health (SDOH) Interventions    Readmission Risk Interventions No flowsheet data found.

## 2021-04-15 ENCOUNTER — Other Ambulatory Visit: Payer: Self-pay

## 2021-04-15 ENCOUNTER — Ambulatory Visit: Payer: Medicare Other | Attending: Neurosurgery | Admitting: Physical Therapy

## 2021-04-15 DIAGNOSIS — Z981 Arthrodesis status: Secondary | ICD-10-CM | POA: Diagnosis present

## 2021-04-15 DIAGNOSIS — M6281 Muscle weakness (generalized): Secondary | ICD-10-CM | POA: Insufficient documentation

## 2021-04-15 DIAGNOSIS — R262 Difficulty in walking, not elsewhere classified: Secondary | ICD-10-CM | POA: Insufficient documentation

## 2021-04-15 DIAGNOSIS — R2689 Other abnormalities of gait and mobility: Secondary | ICD-10-CM | POA: Diagnosis present

## 2021-04-15 NOTE — Therapy (Addendum)
[facility] [address] [place], [place], [address] Phone: [phone]   Fax:  [phone]  Physical Therapy Evaluation  Patient Details  Name: Jean Johnson MRN: [id number] Date of Birth: Jun 28, 1951 Referring Provider (PT): Meade Maw, MD   Encounter Date: 04/15/2021   PT End of Session - 04/16/21 1432     Visit Number 1    Number of Visits 17    Authorization Type Medicare and [employer] - VL based on medical necessity, no auth required    Authorization Time Period initial eval [id number], Cert [id number]    Progress Note Due on Visit 10    PT Start Time 1440    PT Stop Time 1530    PT Time Calculation (min) 50 min    Activity Tolerance Patient tolerated treatment well;Patient limited by fatigue    Behavior During Therapy [facility] for tasks assessed/performed             Past Medical History:  Diagnosis Date   Anemia of chronic renal disease    Aortic stenosis, mild    a.) TTE on 11/23/2020 --> mean gradient 9.7 mmHg   Atrial fibrillation ([place])    a.) CHA2DS2-VASc = 3 (age, sex, diabetes). b.) daily apixaban   B12 deficiency    Cervical spinal stenosis    Chronic anticoagulation    Apixaban   Diabetic neuropathy (HCC)    Diverticulosis    Dyspnea    ESRD (end stage renal disease) (HCC)    First degree AV block    HLD (hyperlipidemia)    Hypertension    Incomplete right bundle branch block (RBBB)    LAE (left atrial enlargement)    a.) TTE on 11/23/2020 --> moderate   Left thyroid nodule    a.) Cervical MRI on 12/29/2020 --> measured "at least" 3.5 cm; imcompletely imaged.   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   Valvular heart disease    a.) TTE 11/23/2020 --> mild panvalvular regurgitation    Past Surgical History:  Procedure Laterality Date   ANTERIOR CERVICAL DECOMP/DISCECTOMY FUSION N/A 03/24/2021   Procedure: C3-6 ANTERIOR CERVICAL DECOMPRESSION/DISCECTOMY FUSION 3 LEVELS;  Surgeon: Meade Maw, MD;  Location: [facility];  Service: Neurosurgery;  Laterality: N/A;   CATARACT EXTRACTION W/PHACO Left 10/21/2020   Procedure: CATARACT EXTRACTION PHACO AND INTRAOCULAR LENS PLACEMENT ([place]) DIABETES LEFT;  Surgeon: Leandrew Koyanagi, MD;  Location: [facility];  Service: Ophthalmology;  Laterality: Left;  2.84 0:48.8 5.8%   EYE SURGERY Right 2020   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    Subjective Assessment - 04/15/21 1438     Subjective Patient is a 70 year old female previously seen for dizziness and imbalance, currently s/p C3-6 ACDF (DOS: 03/24/21).    Pertinent History Patient is a 70 year old female previously seen for dizziness and imbalance, currently s/p C3-6 ACDF (DOS: 03/24/21). No significant complications post-op. She reports some irritation  recently along upper periscapular region. Patient is currently donning neck brace until next follow-up with her surgeon. She is refraining from cervical AROM per instructions from physician.  Patient reports using brace the entire day except for completing hygiene tasks, eating, and sleeping. Patient currently has precautions for lifting > one gallon per report; physician's notes state no lifting > 10 pounds in first 6 weeks. Pt is precautioned from bending and stooping at this time. Patient does have remaining neuropathic symptoms on RUE/RLE. Patient  reports no notable sciatic-type symptoms at this time. Patient reports no more urinary urgency/frequency than usual; she does occasionally feel hurried to get to the bathroom. Patient reports no fever, chills, night sweats. Pt has worked on gait with home health; she has walked as far 208 feet. She reports significant difficulty with stair negotiation at this time. Patient reports no stairs to enter/exit home. She has to negotiate 14 steps to get to her bedroom - handrail on L side. Pt has tub shower with shower bench; no grab bars. Pt has to negotiate gravel to get into her home. Pt reports having intermittent slight dizziness, but "not that much" recently.    Limitations Walking;House hold activities;Standing    Diagnostic tests Intraoperative fluoroscopy - no post-op imaging yet    Patient Stated Goals Able to walk with improved steadiness                [facility] PT Assessment - 04/16/21 1429       Assessment   Medical Diagnosis Primary    Cervical spondylosis with myelopathy, s/p cervical spinal fusion    Referring Provider (PT) Meade Maw, MD    Onset Date/Surgical Date 03/24/21    Next MD Visit 05/06/21    Prior Therapy PT pre-operatively for dizziness/balance      Precautions   Precautions Fall      Balance Screen   Has the patient fallen in the past 6 months Yes    Has the patient had a decrease in activity level because of a fear of falling?  Yes    Is the patient reluctant to leave their home because of a fear of falling?  Yes      Prior Function   Level of Independence Independent with household mobility with device      Cognition   Overall Cognitive Status Within Functional Limits for tasks assessed             SUBJECTIVE Chief complaint: Patient is a 70 year old female previously seen for dizziness and imbalance, currently s/p C3-6 ACDF (DOS: 03/24/21).  Onset: Patient is a 70 year old female previously seen for dizziness and imbalance, currently s/p C3-6  ACDF (DOS: 03/24/21). No significant complications post-op. She reports some irritation recently along upper periscapular region. Patient is currently donning neck brace until next follow-up with her surgeon. She is refraining from cervical AROM per instructions from physician.  Patient reports using brace the entire day except for completing hygiene tasks, eating, and sleeping. Patient currently has precautions for lifting > one gallon per report; physician's notes state no lifting > 10 pounds in first 6 weeks. Pt is precautioned from bending and stooping at this time. Patient does have remaining neuropathic symptoms on RUE/RLE. Patient reports no notable sciatic-type symptoms at this time. Patient reports no more urinary urgency/frequency than usual; she does occasionally feel hurried to get to the bathroom. Patient reports no fever, chills, night sweats. Pt has worked on gait with home health; she has walked as far 208  feet. She reports significant difficulty with stair negotiation at this time. Patient reports no stairs to enter/exit home. She has to negotiate 14 steps to get to her bedroom - handrail on L side. Pt has tub shower with shower bench; no grab bars. Pt has to negotiate gravel to get into her home. Pt reports having intermittent slight dizziness, but "not that much" recently.  Patient stated goals: Able to walk with improved steadiness  Prior history of physical therapy for balance: Yes, prior to ACDF on 03/24/21.  Follow-up appointment with MD: 05/06/21  Red flags (bowel/bladder changes, saddle paresthesia, personal history of cancer, chills/fever, night sweats, unrelenting pain) Negative   OBJECTIVE  MUSCULOSKELETAL: Tremor: Absent Bulk: Mild generalized lower limb atrophy Tone: Normal, no clonus  Observation Pt donning [place] cervical collar. Anterior cervical incision healing appropriately with no sign of infection at this time  Posture FHRS posture, increased thoracic  kyphosis  Gait Ambulating with rollator; Decreased stance time LLE, mild Trendelenburg L, decreased single-limb support time bilaterally  Strength R/L 4/4- Hip flexion 4+/4 Hip abduction (seated) 5/5 Hip adduction 4+/4- Knee extension 4/4- Knee flexion 5/4+ Ankle Dorsiflexion 4/4 Ankle Plantarflexion *indicates pain  Cervical spine AROM deferred   NEUROLOGICAL:  Mental Status Patient is oriented to person, place and time.  Recent memory is intact.  Remote memory is intact.  Attention span and concentration are intact.  Expressive speech is intact.  Patient's fund of knowledge is within normal limits for educational level.  Cranial Nerves Deferred  Sensation Grossly intact to light touch bilateral UEs/LEs as determined by testing dermatomes C2-T2/L2-S2 respectively   Coordination/Cerebellar Finger to Nose: mild hypometria bilaterally  Heel to Shin: WNL Dysdiadochokinesia: WNL Finger Opposition: WNL Pronator Drift: Negative   FUNCTIONAL OUTCOME MEASURES   Results Comments  BERG (Next visit) /56 Fall risk, in need of intervention  TUG 25 seconds   5TSTS 31 seconds   6 Minute Walk Test Deferred   10 Meter Gait Speed Self-selected: s =  0.67 m/s;  Below normative values for full community ambulation    POSTURAL CONTROL TESTS   [place]: Eyes Open 30 seconds, Eyes closed 3 seconds  Modified CTSIB deferred today      TREATMENT   Therapeutic Activities  - patient education and activities to improve patient function, HEP establishment   Patient education: Reviewed restrictions previously discussed with MD c patient. Discussed PT plan of care, prognosis. Reviewed HEP and provided handout for patient.        ASSESSMENT Clinical Impression: Pt is a pleasant 70 year old female s/p C3-6 cervical fusion (DOS: 03/24/21). Pt is currently required to wear cervical collar throughout the day and is refraining from significant cervical spine AROM at this time  per post-operative restrictions. PT examination reveals deficits in transferring, gait, stair/obstacle negotiation, static standing postural control, non-equilibrium UE coordination (mild), and postural changes. Pt presents with deficits in strength, gait and balance. Pt will benefit from skilled PT services to address deficits in functional mobility, gait, and balance as needed for best return to PLOF and improved QoL.     PT Short Term Goals - 04/16/21 1446       PT SHORT TERM GOAL #1   Title Pt will be independent with HEP in order to improve strength and balance in order to decrease fall risk and improve function at home and work.    Baseline 04/15/21: HEP initiated    Time 3    Period Days    Status New  Target Date 05/06/21      PT SHORT TERM GOAL #2   Title Patient will demonstrate modified independent gait with SPC for home-mobility distance (150 ft or greater) in clinic with no LOB and safe management of AD    Baseline 04/15/21: Patient using rollator as primary means of mobility    Time 4    Period Weeks    Status New    Target Date 05/13/21               PT Long Term Goals - 04/16/21 1448       PT LONG TERM GOAL #1   Title Pt will improve BERG by at least 3 points in order to demonstrate clinically significant improvement in balance.    Baseline 04/15/21: Baseline BERG to be obtained next visit    Time 8    Period Weeks    Status New    Target Date 06/10/21      PT LONG TERM GOAL #2   Title Pt will decrease 5TSTS by at least 3 seconds in order to demonstrate clinically significant improvement in LE strength.    Baseline 04/15/21: 5TSTS 31 sec    Time 8    Period Weeks    Status New    Target Date 06/10/21      PT LONG TERM GOAL #3   Title Pt will decrease TUG to below 14 seconds/decrease in order to demonstrate decreased fall risk.    Baseline 04/15/21: TUG 25 sec    Time 8    Period Weeks    Status New    Target Date 06/10/21      PT LONG TERM GOAL #4    Title Patient will demonstrate improved function as evidenced by a score of 64 on FOTO measure for full participation in activities at home and in the community.    Baseline 04/15/21: FOTO 53    Time 8    Period Weeks    Status New    Target Date 06/10/21                    Plan - 04/16/21 1442     Clinical Impression Statement Pt is a pleasant 70 year old female s/p C3-6 cervical fusion (DOS: 03/24/21). Pt is currently required to wear cervical collar throughout the day and is refraining from significant cervical spine AROM at this time per post-operative restrictions. PT examination reveals deficits in transferring, gait, stair/obstacle negotiation, static standing postural control, non-equilibrium UE coordination (mild), and postural changes. Pt presents with deficits in strength, gait and balance. Pt will benefit from skilled PT services to address deficits in functional mobility, gait, and balance as needed for best return to PLOF and improved QoL.    Personal Factors and Comorbidities Age;Comorbidity 3+    Comorbidities chronic kidney disease, Type II DM, peripheral neuropathy, hypertension, Hx of A-fib, cervical spine stenosis s/p C3-6 fusion    Examination-Activity Limitations Bed Mobility;Stairs;Stand;Locomotion Level;Transfers;Bend;Dressing    Examination-Participation Restrictions Community Activity;Cleaning    Stability/Clinical Decision Making Evolving/Moderate complexity    Clinical Decision Making Moderate    Rehab Potential Good    PT Frequency 2x / week    PT Duration 8 weeks    PT Treatment/Interventions Gait training;Stair training;Functional mobility training;Therapeutic activities;Therapeutic exercise;Balance training;Neuromuscular re-education;Patient/family education;ADLs/Self Care Home Management;Cryotherapy;Electrical Stimulation;Moist Heat    PT Next Visit Plan BERG testing, further postural control assessment. Continue with gait and balance re-training, LE  strengthening. Continue strict observance of post-op restrictions.  PT Home Exercise Plan Access Code: [email or web address]    Consulted and Agree with Plan of Care Patient             Patient will benefit from skilled therapeutic intervention in order to improve the following deficits and impairments:  Abnormal gait, Postural dysfunction, Difficulty walking, Decreased balance, Decreased coordination, Decreased strength, Decreased activity tolerance  Visit Diagnosis: Difficulty in walking, not elsewhere classified  Imbalance  Muscle weakness (generalized)  S/P cervical spinal fusion     Problem List Patient Active Problem List   Diagnosis Date Noted   Cervical myelopathy ([place]) 03/24/2021   Anemia 12/03/2020   Essential hypertension 12/03/2020   Hypothyroidism 12/03/2020   Type 2 diabetes mellitus ([place]) 12/03/2020   Atrial fibrillation ([place]) 09/21/2018   Kidney transplant status 09/21/2018   Osteoporosis 09/21/2018   *Addendum to correct reason for visit* Valentina Gu, PT, DPT [id number]  Eilleen Kempf, PT 04/16/2021, 2:58 PM  [facility] [address] [place], [place], [address] Phone: [phone]   Fax:  [phone]  Name: Jean Johnson MRN: [id number] Date of Birth: 29-Apr-1951

## 2021-04-16 ENCOUNTER — Encounter: Payer: Self-pay | Admitting: Physical Therapy

## 2021-04-19 ENCOUNTER — Encounter: Payer: Self-pay | Admitting: Physical Therapy

## 2021-04-19 ENCOUNTER — Other Ambulatory Visit: Payer: Self-pay

## 2021-04-19 ENCOUNTER — Ambulatory Visit: Payer: Medicare Other | Admitting: Physical Therapy

## 2021-04-19 DIAGNOSIS — Z981 Arthrodesis status: Secondary | ICD-10-CM

## 2021-04-19 DIAGNOSIS — M6281 Muscle weakness (generalized): Secondary | ICD-10-CM

## 2021-04-19 DIAGNOSIS — R2689 Other abnormalities of gait and mobility: Secondary | ICD-10-CM

## 2021-04-19 DIAGNOSIS — R262 Difficulty in walking, not elsewhere classified: Secondary | ICD-10-CM

## 2021-04-19 NOTE — Therapy (Signed)
[facility] [address] [place], [place], [address] Phone: [phone]   Fax:  [phone]  Physical Therapy Treatment  Patient Details  Name: Jean Johnson MRN: [id number] Date of Birth: [birth year] Referring Provider (PT): Meade Maw, MD   Encounter Date: 04/19/2021   PT End of Session - 04/19/21 1509     Visit Number 2    Number of Visits 17    Authorization Type Medicare and [employer] - VL based on medical necessity, no auth required    Authorization Time Period initial eval [id number], Cert [id number]    Progress Note Due on Visit 10    PT Start Time 1502    PT Stop Time 1544    PT Time Calculation (min) 42 min    Equipment Utilized During Treatment Gait belt   rollator for negotiating clinic   Activity Tolerance Patient tolerated treatment well;Patient limited by fatigue    Behavior During Therapy [facility] for tasks assessed/performed             Past Medical History:  Diagnosis Date   Anemia of chronic renal disease    Aortic stenosis, mild    a.) TTE on 11/23/2020 --> mean gradient 9.7 mmHg   Atrial fibrillation ([place])    a.) CHA2DS2-VASc = 3 (age, sex, diabetes). b.) daily apixaban   B12 deficiency    Cervical spinal stenosis    Chronic anticoagulation    Apixaban   Diabetic neuropathy (HCC)    Diverticulosis    Dyspnea    ESRD (end stage renal disease) (HCC)    First degree AV block    HLD (hyperlipidemia)    Hypertension    Incomplete right bundle branch block (RBBB)    LAE (left atrial enlargement)    a.) TTE on 11/23/2020 --> moderate   Left thyroid nodule    a.) Cervical MRI on 12/29/2020 --> measured "at least" 3.5 cm; imcompletely imaged.   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   Valvular heart disease    a.) TTE 11/23/2020 --> mild panvalvular regurgitation    Past Surgical History:  Procedure Laterality Date   ANTERIOR CERVICAL DECOMP/DISCECTOMY FUSION N/A 03/24/2021   Procedure: C3-6 ANTERIOR CERVICAL DECOMPRESSION/DISCECTOMY FUSION 3 LEVELS;  Surgeon: Meade Maw, MD;  Location: [facility] ORS;  Service: Neurosurgery;  Laterality: N/A;   CATARACT EXTRACTION W/PHACO Left 10/21/2020   Procedure: CATARACT EXTRACTION PHACO AND INTRAOCULAR LENS PLACEMENT ([place]) DIABETES LEFT;  Surgeon: Leandrew Koyanagi, MD;  Location: [place];  Service: Ophthalmology;  Laterality: Left;  2.84 0:48.8 5.8%   EYE SURGERY Right 2020   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   Subjective Assessment - 04/19/21 1507     Subjective Patient reports some dizziness this AM. Patient reports feeling it mildly at arrival to therapy today. Patient reports sensation of spinning versus lightheadedness or faintness today. She reports her blood glucose was 80 this  AM. She reports doing well with her baseline HEP. Pt is using cervical collar throughout the day with exception of sleeping and eating. She reports minimal pain at arrival to PT.    Pertinent History Patient is a 70 year old female previously seen for dizziness and imbalance, currently s/p C3-6 ACDF (DOS: 03/24/21). No significant complications post-op. She reports some irritation recently along upper periscapular region. Patient is currently donning neck brace until next follow-up with her surgeon. She is refraining from cervical AROM per instructions from physician.   Patient reports using brace the entire day except for completing hygiene tasks, eating, and sleeping. Patient currently has precautions for lifting > one gallon per report; physician's notes state no lifting > 10 pounds in first 6 weeks. Pt is precautioned from bending and stooping at this time. Patient does have remaining neuropathic symptoms on RUE/RLE. Patient reports no notable sciatic-type symptoms at this time. Patient reports no more urinary urgency/frequency than usual; she does occasionally feel hurried to get to the bathroom. Patient reports no fever, chills, night sweats. Pt has worked on gait with home health; she has walked as far 208 feet. She reports significant difficulty with stair negotiation at this time. Patient reports no stairs to enter/exit home. She has to negotiate 14 steps to get to her bedroom - handrail on L side. Pt has tub shower with shower bench; no grab bars. Pt has to negotiate gravel to get into her home. Pt reports having intermittent slight dizziness, but "not that much" recently.    Limitations Walking;House hold activities;Standing    Diagnostic tests Intraoperative fluoroscopy - no post-op imaging yet    Patient Stated Goals Able to walk with improved steadiness                   TREATMENT    Physical Performance Testing - to establish current fall risk and assess for specific impairments for which patient could benefit from PT to improve function and risk of falling  -BERG testing -Patient education: discussed results of BERG testing, fall risk, goals of PT    Neuromuscular Re-education - for static and dynamic postural stability as needed to improve balance with performance of ADLs and household/community mobility tasks, equilibrium and non-equilibrium coordination to improve motor control with standing activities  -Standing feet apart, eyes closed; 2x30sec  In // bars: Forward and retro stepping; 2x D/B Standing toe tap; Airex pad;  x10   Therapeutic Exercise - improved strength as needed to improve performance of CKC activities/functional movements  Seated march; 2x10 [heaviness with lifting LLE] LAQ; 2x10; 3-lb cuff weight, bilateral LE   Pt edu: Discussed recent symptoms related to dizziness and recommended follow-up with physician regarding intermittent dizziness   *next visit* Standing heel raise/toe raise, alternating; 2x10   Seated hip abduction, blue Tband; 2x10    ASSESSMENT Patient arrives with excellent motivation to participate in physical therapy. She is continuing use of cervical collar as directed by her surgeon. She is compliant with activity modification given by her surgeon. Patient completed BERG today with apparent deficits in postural control, weight shifting fully to LLE, and mild instability with change-of-direction (noted with 360-degree turn). Patient tolerates treatment fairly well at this time with introduction of stepping drills and low-intensity seated drills performed today. Patient has remaining deficits in LE strength, postural stability, gait changes, mild UE coordination deficits, dec cervical spine AROM (s/p fusion and currently in collar throughout the day), and difficulty with transferring and stair/obstacle negotiation.  Patient will benefit  from continued skilled therapeutic intervention to address the above deficits as needed for improved function and QoL.      PT Short Term Goals - 04/16/21 1446       PT SHORT TERM GOAL #1   Title Pt will be independent with HEP in order to improve strength and balance in order to decrease fall risk and improve function at home and work.    Baseline 04/15/21: HEP initiated    Time 3    Period Days    Status New    Target Date 05/06/21      PT SHORT TERM GOAL #2   Title Patient will demonstrate modified independent gait with SPC for home-mobility distance (150 ft or greater) in clinic with no LOB and safe management of AD    Baseline  04/15/21: Patient using rollator as primary means of mobility    Time 4    Period Weeks    Status New    Target Date 05/13/21               PT Long Term Goals - 04/16/21 1448       PT LONG TERM GOAL #1   Title Pt will improve BERG by at least 3 points in order to demonstrate clinically significant improvement in balance.    Baseline 04/15/21: Baseline BERG to be obtained next visit    Time 8    Period Weeks    Status New    Target Date 06/10/21      PT LONG TERM GOAL #2   Title Pt will decrease 5TSTS by at least 3 seconds in order to demonstrate clinically significant improvement in LE strength.    Baseline 04/15/21: 5TSTS 31 sec    Time 8    Period Weeks    Status New    Target Date 06/10/21      PT LONG TERM GOAL #3   Title Pt will decrease TUG to below 14 seconds/decrease in order to demonstrate decreased fall risk.    Baseline 04/15/21: TUG 25 sec    Time 8    Period Weeks    Status New    Target Date 06/10/21      PT LONG TERM GOAL #4   Title Patient will demonstrate improved function as evidenced by a score of 64 on FOTO measure for full participation in activities at home and in the community.    Baseline 04/15/21: FOTO 53    Time 8    Period Weeks    Status New    Target Date 06/10/21                   Plan - 04/20/21 0730     Clinical Impression Statement Patient arrives with excellent motivation to participate in physical therapy. She is continuing use of cervical collar as directed by her surgeon. She is compliant with activity modification given by her surgeon. Patient completed BERG today with apparent deficits in postural control, weight shifting fully to LLE, and mild instability with change-of-direction (noted with 360-degree turn). Patient tolerates treatment fairly well at this time with introduction of stepping drills and low-intensity seated drills performed today. Patient has remaining deficits in LE strength, postural stability, gait  changes, mild UE coordination deficits, dec cervical spine AROM (s/p fusion and currently in collar throughout the day), and difficulty with transferring and stair/obstacle negotiation.  Patient will benefit from continued skilled therapeutic intervention to address the above deficits as needed for improved function  and QoL    Personal Factors and Comorbidities Age;Comorbidity 3+    Comorbidities chronic kidney disease, Type II DM, peripheral neuropathy, hypertension, Hx of A-fib, cervical spine stenosis s/p C3-6 fusion    Examination-Activity Limitations Bed Mobility;Stairs;Stand;Locomotion Level;Transfers;Bend;Dressing    Examination-Participation Restrictions Community Activity;Cleaning    Stability/Clinical Decision Making Evolving/Moderate complexity    Rehab Potential Good    PT Frequency 2x / week    PT Duration 8 weeks    PT Treatment/Interventions Gait training;Stair training;Functional mobility training;Therapeutic activities;Therapeutic exercise;Balance training;Neuromuscular re-education;Patient/family education;ADLs/Self Care Home Management;Cryotherapy;Electrical Stimulation;Moist Heat    PT Next Visit Plan Continue with gait and balance re-training, LE strengthening. UE coordination work in sitting/standing. Continue strict observance of post-op restrictions.    PT Home Exercise Plan Access Code: [email or web address]    Consulted and Agree with Plan of Care Patient             Patient will benefit from skilled therapeutic intervention in order to improve the following deficits and impairments:  Abnormal gait, Postural dysfunction, Difficulty walking, Decreased balance, Decreased coordination, Decreased strength, Decreased activity tolerance  Visit Diagnosis: Difficulty in walking, not elsewhere classified  Imbalance  Muscle weakness (generalized)  S/P cervical spinal fusion     Problem List Patient Active Problem List   Diagnosis Date Noted   Cervical myelopathy ([place])  03/24/2021   Anemia 12/03/2020   Essential hypertension 12/03/2020   Hypothyroidism 12/03/2020   Type 2 diabetes mellitus ([place]) 12/03/2020   Atrial fibrillation ([place]) 09/21/2018   Kidney transplant status 09/21/2018   Osteoporosis 09/21/2018   Valentina Gu, PT, DPT [id number]  Eilleen Kempf, PT 04/20/2021, 9:07 AM  [facility] [address] [place], [place], [address] Phone: [phone]   Fax:  [phone]  Name: Jean Johnson MRN: [id number] Date of Birth: 1950-12-21

## 2021-04-20 ENCOUNTER — Encounter: Payer: Self-pay | Admitting: Physical Therapy

## 2021-04-20 ENCOUNTER — Ambulatory Visit: Payer: Medicare Other | Admitting: Physical Therapy

## 2021-04-20 DIAGNOSIS — R262 Difficulty in walking, not elsewhere classified: Secondary | ICD-10-CM | POA: Diagnosis not present

## 2021-04-20 DIAGNOSIS — Z981 Arthrodesis status: Secondary | ICD-10-CM

## 2021-04-20 DIAGNOSIS — R2689 Other abnormalities of gait and mobility: Secondary | ICD-10-CM

## 2021-04-20 DIAGNOSIS — M6281 Muscle weakness (generalized): Secondary | ICD-10-CM

## 2021-04-20 NOTE — Therapy (Signed)
[facility] [address] [place], [place], [address] Phone: [phone]   Fax:  [phone]  Physical Therapy Treatment  Patient Details  Name: Jean Johnson MRN: [id number] Date of Birth: [birth year] Referring Provider (PT): Meade Maw, MD   Encounter Date: 04/20/2021   PT End of Session - 04/20/21 1516     Visit Number 3    Number of Visits 17    Authorization Type Medicare and [employer]etna - VL based on medical necessity, no auth required    Authorization Time Period initial eval [id number], Cert [id number]    Progress Note Due on Visit 10    PT Start Time 1436    PT Stop Time 1517    PT Time Calculation (min) 41 min    Equipment Utilized During Treatment Gait belt   rollator for negotiating clinic   Activity Tolerance Patient tolerated treatment well;Patient limited by fatigue    Behavior During Therapy [facility] for tasks assessed/performed             Past Medical History:  Diagnosis Date   Anemia of chronic renal disease    Aortic stenosis, mild    a.) TTE on 11/23/2020 --> mean gradient 9.7 mmHg   Atrial fibrillation ([place])    a.) CHA2DS2-VASc = 3 (age, sex, diabetes). b.) daily apixaban   B12 deficiency    Cervical spinal stenosis    Chronic anticoagulation    Apixaban   Diabetic neuropathy (HCC)    Diverticulosis    Dyspnea    ESRD (end stage renal disease) (HCC)    First degree AV block    HLD (hyperlipidemia)    Hypertension    Incomplete right bundle branch block (RBBB)    LAE (left atrial enlargement)    a.) TTE on 11/23/2020 --> moderate   Left thyroid nodule    a.) Cervical MRI on 12/29/2020 --> measured "at least" 3.5 cm; imcompletely imaged.   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   Valvular heart disease    a.) TTE 11/23/2020 --> mild panvalvular regurgitation    Past Surgical History:  Procedure Laterality Date   ANTERIOR CERVICAL DECOMP/DISCECTOMY FUSION N/A 03/24/2021   Procedure: C3-6 ANTERIOR CERVICAL DECOMPRESSION/DISCECTOMY FUSION 3 LEVELS;  Surgeon: Meade Maw, MD;  Location: [facility] ORS;  Service: Neurosurgery;  Laterality: N/A;   CATARACT EXTRACTION W/PHACO Left 10/21/2020   Procedure: CATARACT EXTRACTION PHACO AND INTRAOCULAR LENS PLACEMENT ([place]) DIABETES LEFT;  Surgeon: Leandrew Koyanagi, MD;  Location: [place];  Service: Ophthalmology;  Laterality: Left;  2.84 0:48.8 5.8%   EYE SURGERY Right 2020   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   Subjective Assessment - 04/20/21 1439     Subjective Patient reports mild dizziness at arrival to PT. She reports low blood sugar, at 74 this AM. She took two glucose tablets since then. Patient reports neck is doing "okay' this afternoon. She reports  some irritability of L paracervical region at night. Patient reports using extra strength Tylenol for pain control as needed. Patient reports tolerating yesterday's session well.    Pertinent History Patient is a 70 year old female previously seen for dizziness and imbalance, currently s/p C3-6 ACDF (DOS: 03/24/21). No significant complications post-op. She reports some irritation recently along upper periscapular region. Patient is currently donning neck brace until next follow-up with her surgeon. She is refraining from cervical AROM per instructions from physician.  Patient reports using brace  the entire day except for completing hygiene tasks, eating, and sleeping. Patient currently has precautions for lifting > one gallon per report; physician's notes state no lifting > 10 pounds in first 6 weeks. Pt is precautioned from bending and stooping at this time. Patient does have remaining neuropathic symptoms on RUE/RLE. Patient reports no notable sciatic-type symptoms at this time. Patient reports no more urinary urgency/frequency than usual; she does occasionally feel hurried to get to the bathroom. Patient reports no fever, chills, night sweats. Pt has worked on gait with home health; she has walked as far 208 feet. She reports significant difficulty with stair negotiation at this time. Patient reports no stairs to enter/exit home. She has to negotiate 14 steps to get to her bedroom - handrail on L side. Pt has tub shower with shower bench; no grab bars. Pt has to negotiate gravel to get into her home. Pt reports having intermittent slight dizziness, but "not that much" recently.    Limitations Walking;House hold activities;Standing    Diagnostic tests Intraoperative fluoroscopy - no post-op imaging yet    Patient Stated Goals Able to walk with improved steadiness              TREATMENT     Neuromuscular Re-education - for static and dynamic postural stability as needed to improve balance with performance of ADLs and household/community mobility tasks, equilibrium and non-equilibrium coordination to improve motor control with standing activities    In // bars: Forward and retro stepping; 3x D/B Standing toe tap; Airex pad; 2x10 alternating  Standing feet apart, eyes closed; 2x30sec Half Tandem stance; 2x20sec, bilateral      Therapeutic Exercise - improved strength as needed to improve performance of CKC activities/functional movements   Seated march; 2x10 [heaviness with lifting LLE] LAQ; 2x10; 3-lb cuff weight, bilateral LE Seated hip abduction, blue Tband; 2x10     *next  visit* In // bars: Standing heel raise/toe raise, alternating; 2x10        ASSESSMENT Patient  is continuing use of cervical collar as directed by her surgeon. She exhibits significant difficulty with full weight shift onto left lower extremity and is significantly challenged with standing with narrow base of support. She needs further work on L lower limb strengthening and postural control. We are currently refraining from cervical AROM until cleared by surgeon. Patient has remaining deficits in LE strength, postural stability, gait changes, mild UE coordination deficits, dec cervical spine AROM (s/p fusion and currently in collar throughout the day), and difficulty with transferring and stair/obstacle negotiation.  Patient will benefit from continued skilled therapeutic intervention to address the above deficits as needed for improved function and QoL.        PT Short Term Goals - 04/16/21 1446       PT SHORT TERM GOAL #1   Title Pt will be independent with HEP in order to improve strength and balance in order to decrease fall risk and improve function at home and work.  Baseline 04/15/21: HEP initiated    Time 3    Period Days    Status New    Target Date 05/06/21      PT SHORT TERM GOAL #2   Title Patient will demonstrate modified independent gait with SPC for home-mobility distance (150 ft or greater) in clinic with no LOB and safe management of AD    Baseline 04/15/21: Patient using rollator as primary means of mobility    Time 4    Period Weeks    Status New    Target Date 05/13/21               PT Long Term Goals - 04/16/21 1448       PT LONG TERM GOAL #1   Title Pt will improve BERG by at least 3 points in order to demonstrate clinically significant improvement in balance.    Baseline 04/15/21: Baseline BERG to be obtained next visit    Time 8    Period Weeks    Status New    Target Date 06/10/21      PT LONG TERM GOAL #2   Title Pt will decrease 5TSTS by at  least 3 seconds in order to demonstrate clinically significant improvement in LE strength.    Baseline 04/15/21: 5TSTS 31 sec    Time 8    Period Weeks    Status New    Target Date 06/10/21      PT LONG TERM GOAL #3   Title Pt will decrease TUG to below 14 seconds/decrease in order to demonstrate decreased fall risk.    Baseline 04/15/21: TUG 25 sec    Time 8    Period Weeks    Status New    Target Date 06/10/21      PT LONG TERM GOAL #4   Title Patient will demonstrate improved function as evidenced by a score of 64 on FOTO measure for full participation in activities at home and in the community.    Baseline 04/15/21: FOTO 53    Time 8    Period Weeks    Status New    Target Date 06/10/21                   Plan - 04/21/21 1713     Clinical Impression Statement Patient  is continuing use of cervical collar as directed by her surgeon. She exhibits significant difficulty with full weight shift onto left lower extremity and is significantly challenged with standing with narrow base of support. She needs further work on L lower limb strengthening and postural control. We are currently refraining from cervical AROM until cleared by surgeon. Patient has remaining deficits in LE strength, postural stability, gait changes, mild UE coordination deficits, dec cervical spine AROM (s/p fusion and currently in collar throughout the day), and difficulty with transferring and stair/obstacle negotiation.  Patient will benefit from continued skilled therapeutic intervention to address the above deficits as needed for improved function and QoL.    Personal Factors and Comorbidities Age;Comorbidity 3+    Comorbidities chronic kidney disease, Type II DM, peripheral neuropathy, hypertension, Hx of A-fib, cervical spine stenosis s/p C3-6 fusion    Examination-Activity Limitations Bed Mobility;Stairs;Stand;Locomotion Level;Transfers;Bend;Dressing    Examination-Participation Restrictions Community  Activity;Cleaning    Stability/Clinical Decision Making Evolving/Moderate complexity    Rehab Potential Good    PT Frequency 2x / week    PT Duration 8 weeks    PT Treatment/Interventions Gait training;Stair training;Functional mobility training;Therapeutic activities;Therapeutic  exercise;Balance training;Neuromuscular re-education;Patient/family education;ADLs/Self Care Home Management;Cryotherapy;Electrical Stimulation;Moist Heat    PT Next Visit Plan Continue with gait and balance re-training, LE strengthening. UE coordination work in sitting/standing. Continue strict observance of post-op restrictions.    PT Home Exercise Plan Access Code: [email or web address]    Consulted and Agree with Plan of Care Patient             Patient will benefit from skilled therapeutic intervention in order to improve the following deficits and impairments:  Abnormal gait, Postural dysfunction, Difficulty walking, Decreased balance, Decreased coordination, Decreased strength, Decreased activity tolerance  Visit Diagnosis: Difficulty in walking, not elsewhere classified  Imbalance  Muscle weakness (generalized)  S/P cervical spinal fusion     Problem List Patient Active Problem List   Diagnosis Date Noted   Cervical myelopathy ([place]) 03/24/2021   Anemia 12/03/2020   Essential hypertension 12/03/2020   Hypothyroidism 12/03/2020   Type 2 diabetes mellitus ([place]) 12/03/2020   Atrial fibrillation ([place]) 09/21/2018   Kidney transplant status 09/21/2018   Osteoporosis 09/21/2018   Valentina Gu, PT, DPT [id number]  Eilleen Kempf, PT 04/21/2021, 5:13 PM  [facility] [address] [place], [place], [address] Phone: [phone]   Fax:  [phone]  Name: Jasnoor Kin MRN: [id number] Date of Birth: 27-Mar-1951

## 2021-04-22 ENCOUNTER — Encounter: Payer: 59 | Admitting: Physical Therapy

## 2021-04-27 ENCOUNTER — Encounter: Payer: Self-pay | Admitting: Physical Therapy

## 2021-04-27 ENCOUNTER — Other Ambulatory Visit: Payer: Self-pay

## 2021-04-27 ENCOUNTER — Ambulatory Visit: Payer: Medicare Other | Admitting: Physical Therapy

## 2021-04-27 DIAGNOSIS — M6281 Muscle weakness (generalized): Secondary | ICD-10-CM

## 2021-04-27 DIAGNOSIS — R262 Difficulty in walking, not elsewhere classified: Secondary | ICD-10-CM

## 2021-04-27 DIAGNOSIS — R2689 Other abnormalities of gait and mobility: Secondary | ICD-10-CM

## 2021-04-27 DIAGNOSIS — Z981 Arthrodesis status: Secondary | ICD-10-CM

## 2021-04-27 NOTE — Therapy (Signed)
[facility] [address] [place], [place], [address] Phone: [phone]   Fax:  [phone]  Physical Therapy Treatment  Patient Details  Name: Jean Johnson MRN: [id number] Date of Birth: 05/17/1951 Referring Provider (PT): Meade Maw, MD   Encounter Date: 04/27/2021   PT End of Session - 04/27/21 1449     Visit Number 4    Number of Visits 17    Date for PT Re-Evaluation 06/10/21    Authorization Type Medicare and [employer] - VL based on medical necessity, no auth required    Authorization Time Period initial eval [id number], Cert [id number]    Progress Note Due on Visit 10    PT Start Time 1428    PT Stop Time 1512    PT Time Calculation (min) 44 min    Equipment Utilized During Treatment Gait belt   rollator for negotiating clinic   Activity Tolerance Patient tolerated treatment well;Patient limited by fatigue    Behavior During Therapy [facility] for tasks assessed/performed             Past Medical History:  Diagnosis Date   Anemia of chronic renal disease    Aortic stenosis, mild    a.) TTE on 11/23/2020 --> mean gradient 9.7 mmHg   Atrial fibrillation ([place])    a.) CHA2DS2-VASc = 3 (age, sex, diabetes). b.) daily apixaban   B12 deficiency    Cervical spinal stenosis    Chronic anticoagulation    Apixaban   Diabetic neuropathy (HCC)    Diverticulosis    Dyspnea    ESRD (end stage renal disease) (HCC)    First degree AV block    HLD (hyperlipidemia)    Hypertension    Incomplete right bundle branch block (RBBB)    LAE (left atrial enlargement)    a.) TTE on 11/23/2020 --> moderate   Left thyroid nodule    a.) Cervical MRI on 12/29/2020 --> measured "at least" 3.5 cm; imcompletely imaged.   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   Valvular heart disease    a.) TTE 11/23/2020 --> mild panvalvular regurgitation    Past Surgical History:  Procedure Laterality Date   ANTERIOR CERVICAL DECOMP/DISCECTOMY FUSION N/A 03/24/2021   Procedure: C3-6 ANTERIOR CERVICAL DECOMPRESSION/DISCECTOMY FUSION 3 LEVELS;  Surgeon: Meade Maw, MD;  Location: [facility];  Service: Neurosurgery;  Laterality: N/A;   CATARACT EXTRACTION W/PHACO Left 10/21/2020   Procedure: CATARACT EXTRACTION PHACO AND INTRAOCULAR LENS PLACEMENT ([place]) DIABETES LEFT;  Surgeon: Leandrew Koyanagi, MD;  Location: [place];  Service: Ophthalmology;  Laterality: Left;  2.84 0:48.8 5.8%   EYE SURGERY Right 2020   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   Subjective Assessment - 04/27/21 1432     Subjective Patient reports feeling somewhat out of breath at arrival to PT. Patient voices concern with edema in her lower limbs. She reports compliance with her HEP.  She reports intermittent feeling of "strain" affecting L paracervical mm. She denies paracervical pain at arrival to PT. Pt has post-op follow-up 05/06/21.    Pertinent History Patient is a 70 year old female previously seen for dizziness and imbalance, currently s/p C3-6 ACDF (DOS: 03/24/21). No significant complications post-op. She reports some irritation recently along upper periscapular region. Patient is currently donning neck brace until next follow-up with her surgeon. She is refraining from cervical AROM per instructions from physician.  Patient reports using brace the entire day except for  completing hygiene tasks, eating, and sleeping. Patient currently has precautions for lifting > one gallon per report; physician's notes state no lifting > 10 pounds in first 6 weeks. Pt is precautioned from bending and stooping at this time. Patient does have remaining neuropathic symptoms on RUE/RLE. Patient reports no notable sciatic-type symptoms at this time. Patient reports no more urinary urgency/frequency than usual; she does occasionally feel hurried to get to the bathroom. Patient reports no fever, chills, night sweats. Pt has worked on gait with home health; she has walked as far 208 feet. She reports significant difficulty with stair negotiation at this time. Patient reports no stairs to enter/exit home. She has to negotiate 14 steps to get to her bedroom - handrail on L side. Pt has tub shower with shower bench; no grab bars. Pt has to negotiate gravel to get into her home. Pt reports having intermittent slight dizziness, but "not that much" recently.    Limitations Walking;House hold activities;Standing    Diagnostic tests Intraoperative fluoroscopy - no post-op imaging yet    Patient Stated Goals Able to walk with improved steadiness               TREATMENT     Neuromuscular Re-education - for static and dynamic postural stability as needed to improve balance with performance of ADLs and household/community mobility tasks, equilibrium and non-equilibrium coordination to improve motor control with standing activities     In // bars: Forward and retro stepping; 3x D/B Standing Romberg; 3x30sec Standing toe tap; Airex pad; 2x10 alternating  Standing feet apart, eyes closed; 2x30sec Sidestepping; 2x D/B length of parallel bars  *next visit* Half Tandem stance; 2x20sec, bilateral       Therapeutic Exercise - improved strength as needed to improve performance of CKC activities/functional movements and as needed for improved power production to prevent fall during episode of  large postural perturbation    LAQ; 2x10; 4-lb cuff weight, bilateral LE Seated hip abduction, black Tband; 2x10    In // bars: Standing heel raise/toe raise, alternating; 2x10    *not today* Seated march; 2x10 [heaviness with lifting LLE]      ASSESSMENT Patient exhibits ongoing gait instability and most difficulty with full weightbearing onto left lower limb. She is exhibiting improved ability to perform static standing tasks in clinic including performance of Romberg standing with eyes open. She exhibits safe gait and negotiation of clinic with use of rollator following initial cueing to reach for chair during stand to sit (correct technique was demonstrated afterward). Patient is continuing use of cervical collar as directed by her surgeon.  Patient has remaining deficits in LE strength, postural stability, gait changes, mild UE coordination deficits, dec cervical spine AROM (s/p fusion and currently in collar throughout the day), and difficulty with transferring and stair/obstacle negotiation.  Patient will benefit from continued skilled therapeutic intervention to address the above deficits as needed for improved function and QoL.     PT Education -  04/28/21 1839     Education Details Discussed strategies for edema management and recommended follow-up with nephrology    Person(s) Educated Patient    Methods Explanation;Demonstration    Comprehension Verbalized understanding              PT Short Term Goals - 04/16/21 1446       PT SHORT TERM GOAL #1   Title Pt will be independent with HEP in order to improve strength and balance in order to decrease fall risk and improve function at home and work.    Baseline 04/15/21: HEP initiated    Time 3    Period Days    Status New    Target Date 05/06/21      PT SHORT TERM GOAL #2   Title Patient will demonstrate modified independent gait with SPC for home-mobility distance (150 ft or greater) in clinic with no LOB and safe  management of AD    Baseline 04/15/21: Patient using rollator as primary means of mobility    Time 4    Period Weeks    Status New    Target Date 05/13/21               PT Long Term Goals - 04/16/21 1448       PT LONG TERM GOAL #1   Title Pt will improve BERG by at least 3 points in order to demonstrate clinically significant improvement in balance.    Baseline 04/15/21: Baseline BERG to be obtained next visit    Time 8    Period Weeks    Status New    Target Date 06/10/21      PT LONG TERM GOAL #2   Title Pt will decrease 5TSTS by at least 3 seconds in order to demonstrate clinically significant improvement in LE strength.    Baseline 04/15/21: 5TSTS 31 sec    Time 8    Period Weeks    Status New    Target Date 06/10/21      PT LONG TERM GOAL #3   Title Pt will decrease TUG to below 14 seconds/decrease in order to demonstrate decreased fall risk.    Baseline 04/15/21: TUG 25 sec    Time 8    Period Weeks    Status New    Target Date 06/10/21      PT LONG TERM GOAL #4   Title Patient will demonstrate improved function as evidenced by a score of 64 on FOTO measure for full participation in activities at home and in the community.    Baseline 04/15/21: FOTO 53    Time 8    Period Weeks    Status New    Target Date 06/10/21                   Plan - 04/28/21 1846     Clinical Impression Statement Patient exhibits ongoing gait instability and most difficulty with full weightbearing onto left lower limb. She is exhibiting improved ability to perform static standing tasks in clinic including performance of Romberg standing with eyes open. She exhibits safe gait and negotiation of clinic with use of rollator following initial cueing to reach for chair during stand to sit (correct technique was demonstrated afterward). Patient is continuing use of cervical collar as directed by her surgeon.  Patient has remaining deficits in LE strength, postural stability, gait  changes, mild UE coordination deficits, dec cervical spine AROM (s/p fusion and currently in collar throughout the  day), and difficulty with transferring and stair/obstacle negotiation.  Patient will benefit from continued skilled therapeutic intervention to address the above deficits as needed for improved function and QoL.    Personal Factors and Comorbidities Age;Comorbidity 3+    Comorbidities chronic kidney disease, Type II DM, peripheral neuropathy, hypertension, Hx of A-fib, cervical spine stenosis s/p C3-6 fusion    Examination-Activity Limitations Bed Mobility;Stairs;Stand;Locomotion Level;Transfers;Bend;Dressing    Examination-Participation Restrictions Community Activity;Cleaning    Stability/Clinical Decision Making Evolving/Moderate complexity    Rehab Potential Good    PT Frequency 2x / week    PT Duration 8 weeks    PT Treatment/Interventions Gait training;Stair training;Functional mobility training;Therapeutic activities;Therapeutic exercise;Balance training;Neuromuscular re-education;Patient/family education;ADLs/Self Care Home Management;Cryotherapy;Electrical Stimulation;Moist Heat    PT Next Visit Plan Continue with gait and balance re-training, LE strengthening. UE coordination work in sitting/standing. Continue strict observance of post-op restrictions.    PT Home Exercise Plan Access Code: [email or web address]    Consulted and Agree with Plan of Care Patient             Patient will benefit from skilled therapeutic intervention in order to improve the following deficits and impairments:  Abnormal gait, Postural dysfunction, Difficulty walking, Decreased balance, Decreased coordination, Decreased strength, Decreased activity tolerance  Visit Diagnosis: Difficulty in walking, not elsewhere classified  Imbalance  Muscle weakness (generalized)  S/P cervical spinal fusion     Problem List Patient Active Problem List   Diagnosis Date Noted   Cervical myelopathy ([place])  03/24/2021   Anemia 12/03/2020   Essential hypertension 12/03/2020   Hypothyroidism 12/03/2020   Type 2 diabetes mellitus ([place]) 12/03/2020   Atrial fibrillation ([place]) 09/21/2018   Kidney transplant status 09/21/2018   Osteoporosis 09/21/2018   Valentina Gu, PT, DPT [id number]  Eilleen Kempf, PT 04/28/2021, 6:47 PM  [facility] [address] [place], [place], [address] Phone: [phone]   Fax:  [phone]  Name: Jean Johnson MRN: [id number] Date of Birth: May 23, 1951

## 2021-04-29 ENCOUNTER — Encounter: Payer: 59 | Admitting: Physical Therapy

## 2021-05-04 ENCOUNTER — Other Ambulatory Visit: Payer: Self-pay

## 2021-05-04 ENCOUNTER — Ambulatory Visit: Payer: Medicare Other | Admitting: Physical Therapy

## 2021-05-04 DIAGNOSIS — M6281 Muscle weakness (generalized): Secondary | ICD-10-CM

## 2021-05-04 DIAGNOSIS — R262 Difficulty in walking, not elsewhere classified: Secondary | ICD-10-CM | POA: Diagnosis not present

## 2021-05-04 DIAGNOSIS — R2689 Other abnormalities of gait and mobility: Secondary | ICD-10-CM

## 2021-05-04 DIAGNOSIS — Z981 Arthrodesis status: Secondary | ICD-10-CM

## 2021-05-04 NOTE — Therapy (Signed)
[facility] [address]. [place], [place], [address] Phone: [phone]   Fax:  [phone]  Physical Therapy Treatment  Patient Details  Name: Jean Johnson MRN: [id number] Date of Birth: Sep 13, 1950 Referring Provider (PT): Meade Maw, MD   Encounter Date: 05/04/2021   PT End of Session - 05/04/21 1445     Visit Number 5    Number of Visits 17    Date for PT Re-Evaluation 06/10/21    Authorization Type Medicare and [employer] - VL based on medical necessity, no auth required    Authorization Time Period initial eval 62/3/76, Cert 28/3/15-17/61/60    Progress Note Due on Visit 10    PT Start Time 1428    PT Stop Time 1511    PT Time Calculation (min) 43 min    Equipment Utilized During Treatment Gait belt   rollator for negotiating clinic   Activity Tolerance Patient tolerated treatment well;Patient limited by fatigue    Behavior During Therapy [facility] for tasks assessed/performed             Past Medical History:  Diagnosis Date   Anemia of chronic renal disease    Aortic stenosis, mild    a.) TTE on 11/23/2020 --> mean gradient 9.7 mmHg   Atrial fibrillation ([place])    a.) CHA2DS2-VASc = 3 (age, sex, diabetes). b.) daily apixaban   B12 deficiency    Cervical spinal stenosis    Chronic anticoagulation    Apixaban   Diabetic neuropathy (HCC)    Diverticulosis    Dyspnea    ESRD (end stage renal disease) (HCC)    First degree AV block    HLD (hyperlipidemia)    Hypertension    Incomplete right bundle branch block (RBBB)    LAE (left atrial enlargement)    a.) TTE on 11/23/2020 --> moderate   Left thyroid nodule    a.) Cervical MRI on 12/29/2020 --> measured "at least" 3.5 cm; imcompletely imaged.   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   Valvular heart disease    a.) TTE 11/23/2020 --> mild panvalvular regurgitation    Past Surgical History:  Procedure Laterality Date   ANTERIOR CERVICAL DECOMP/DISCECTOMY FUSION N/A 03/24/2021   Procedure: C3-6 ANTERIOR CERVICAL DECOMPRESSION/DISCECTOMY FUSION 3 LEVELS;  Surgeon: Meade Maw, MD;  Location: [facility];  Service: Neurosurgery;  Laterality: N/A;   CATARACT EXTRACTION W/PHACO Left 10/21/2020   Procedure: CATARACT EXTRACTION PHACO AND INTRAOCULAR LENS PLACEMENT ([place]) DIABETES LEFT;  Surgeon: Leandrew Koyanagi, MD;  Location: [facility];  Service: Ophthalmology;  Laterality: Left;  2.84 0:48.8 5.8%   EYE SURGERY Right 2020   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   Subjective Assessment - 05/04/21 1446     Subjective Patient reports still having a "long ways" to go for getting her strength back. Patient reports compliance with her HEP. She reports difficulty with stair negotiation  in her home. Patient reports no other new complaints or updates at arrival this afternoon. Patient wishes to speak with pelvic health PT about impairments related to uterine prolapse.    Pertinent History Patient is a 70 year old female previously seen for dizziness and imbalance, currently s/p C3-6 ACDF (DOS: 03/24/21). No significant complications post-op. She reports some irritation recently along upper periscapular region. Patient is currently donning neck brace until next follow-up with her surgeon. She is refraining from cervical AROM per instructions from physician.  Patient reports  using brace the entire day except for completing hygiene tasks, eating, and sleeping. Patient currently has precautions for lifting > one gallon per report; physician's notes state no lifting > 10 pounds in first 6 weeks. Pt is precautioned from bending and stooping at this time. Patient does have remaining neuropathic symptoms on RUE/RLE. Patient reports no notable sciatic-type symptoms at this time. Patient reports no more urinary urgency/frequency than usual; she does occasionally feel hurried to get to the bathroom. Patient reports no fever, chills, night sweats. Pt has worked on gait with home health; she has walked as far 208 feet. She reports significant difficulty with stair negotiation at this time. Patient reports no stairs to enter/exit home. She has to negotiate 14 steps to get to her bedroom - handrail on L side. Pt has tub shower with shower bench; no grab bars. Pt has to negotiate gravel to get into her home. Pt reports having intermittent slight dizziness, but "not that much" recently.    Limitations Walking;House hold activities;Standing    Diagnostic tests Intraoperative fluoroscopy - no post-op imaging yet    Patient Stated Goals Able to walk with improved steadiness                TREATMENT     Neuromuscular Re-education - for static and dynamic postural stability as needed to improve balance with performance of ADLs and household/community mobility tasks, equilibrium and non-equilibrium coordination to improve motor control with standing activities     In // bars: Forward and retro stepping; 3x D/B Standing toe tap; 6-inch step; 3x10 alternating  Sidestepping; 2x D/B length of parallel bars   *next visit* Half Tandem stance; 2x20sec, bilateral Standing feet apart, eyes closed; 2x30sec  *not today* Standing Romberg; 3x30sec       Therapeutic Exercise - improved strength as needed to improve performance of CKC activities/functional movements and as needed for  improved power production to prevent fall during episode of large postural perturbation  Ambulate laps around gym; x 2 laps with rollator    Standing hip abduction; 2x5 alternating, bilateral LAQ; 2x10; 4-lb cuff weight, bilateral LE Seated hip abduction, black Tband; 2x10    In // bars: Standing heel raise/toe raise, alternating; 2x10     *not today* Seated march; 2x10 [heaviness with lifting LLE]       ASSESSMENT Patient demonstrates improving weight acceptance onto LLE that was significantly weakened from cervical myelopathy and deconditioning. She is still utilizing cervical brace until cleared by surgeon to wean from brace. She exhibits ongoing deficits in static postural control and gait stability. Patient is notably challenged with standing hip abduction drill with notable compensatory contralateral sidebend toward end of each set today. Patient discussed urinary impairments related to uterine prolapse with pelvic health SPT today; pt will follow up with her physician regarding potential referral for pelvic health PT to address these issues. Patient has remaining deficits in LE strength, postural stability, gait changes, mild UE coordination deficits, dec cervical  spine AROM (s/p fusion and currently in collar throughout the day), and difficulty with transferring and stair/obstacle negotiation.  Patient will benefit from continued skilled therapeutic intervention to address the above deficits as needed for improved function and QoL.       PT Education - 05/04/21 1455     Education Details Patient discussed with pelvic health SPT (Nicki ) regarding concerns with urinary sympoms associated with prolapsed uterus. Educated patient on staging of uterine prolapse, pelvic floor strengthening, benefit of pelvis health PT to manage bladder irritants and address pelvic floor impairments related to current urinary complaints.    Person(s) Educated Patient    Methods Explanation     Comprehension Verbalized understanding              PT Short Term Goals - 04/16/21 1446       PT SHORT TERM GOAL #1   Title Pt will be independent with HEP in order to improve strength and balance in order to decrease fall risk and improve function at home and work.    Baseline 04/15/21: HEP initiated    Time 3    Period Days    Status New    Target Date 05/06/21      PT SHORT TERM GOAL #2   Title Patient will demonstrate modified independent gait with SPC for home-mobility distance (150 ft or greater) in clinic with no LOB and safe management of AD    Baseline 04/15/21: Patient using rollator as primary means of mobility    Time 4    Period Weeks    Status New    Target Date 05/13/21               PT Long Term Goals - 04/16/21 1448       PT LONG TERM GOAL #1   Title Pt will improve BERG by at least 3 points in order to demonstrate clinically significant improvement in balance.    Baseline 04/15/21: Baseline BERG to be obtained next visit    Time 8    Period Weeks    Status New    Target Date 06/10/21      PT LONG TERM GOAL #2   Title Pt will decrease 5TSTS by at least 3 seconds in order to demonstrate clinically significant improvement in LE strength.    Baseline 04/15/21: 5TSTS 31 sec    Time 8    Period Weeks    Status New    Target Date 06/10/21      PT LONG TERM GOAL #3   Title Pt will decrease TUG to below 14 seconds/decrease in order to demonstrate decreased fall risk.    Baseline 04/15/21: TUG 25 sec    Time 8    Period Weeks    Status New    Target Date 06/10/21      PT LONG TERM GOAL #4   Title Patient will demonstrate improved function as evidenced by a score of 64 on FOTO measure for full participation in activities at home and in the community.    Baseline 04/15/21: FOTO 53    Time 8    Period Weeks    Status New    Target Date 06/10/21                   Plan - 05/05/21 0927     Clinical Impression Statement Patient demonstrates  improving weight acceptance onto LLE that was significantly weakened from cervical myelopathy and deconditioning. She is still  utilizing cervical brace until cleared by surgeon to wean from brace. She exhibits ongoing deficits in static postural control and gait stability. Patient is notably challenged with standing hip abduction drill with notable compensatory contralateral sidebend toward end of each set today. Patient discussed urinary impairments related to uterine prolapse with pelvic health SPT today; pt will follow up with her physician regarding potential referral for pelvic health PT to address these issues. Patient has remaining deficits in LE strength, postural stability, gait changes, mild UE coordination deficits, dec cervical spine AROM (s/p fusion and currently in collar throughout the day), and difficulty with transferring and stair/obstacle negotiation.  Patient will benefit from continued skilled therapeutic intervention to address the above deficits as needed for improved function and QoL.    Personal Factors and Comorbidities Age;Comorbidity 3+    Comorbidities chronic kidney disease, Type II DM, peripheral neuropathy, hypertension, Hx of A-fib, cervical spine stenosis s/p C3-6 fusion    Examination-Activity Limitations Bed Mobility;Stairs;Stand;Locomotion Level;Transfers;Bend;Dressing    Examination-Participation Restrictions Community Activity;Cleaning    Stability/Clinical Decision Making Evolving/Moderate complexity    Rehab Potential Good    PT Frequency 2x / week    PT Duration 8 weeks    PT Treatment/Interventions Gait training;Stair training;Functional mobility training;Therapeutic activities;Therapeutic exercise;Balance training;Neuromuscular re-education;Patient/family education;ADLs/Self Care Home Management;Cryotherapy;Electrical Stimulation;Moist Heat    PT Next Visit Plan Continue with gait and balance re-training, LE strengthening. UE coordination work in  sitting/standing. Continue strict observance of post-op restrictions.    PT Home Exercise Plan Access Code: [email or web address]    Consulted and Agree with Plan of Care Patient             Patient will benefit from skilled therapeutic intervention in order to improve the following deficits and impairments:  Abnormal gait, Postural dysfunction, Difficulty walking, Decreased balance, Decreased coordination, Decreased strength, Decreased activity tolerance  Visit Diagnosis: Difficulty in walking, not elsewhere classified  Imbalance  Muscle weakness (generalized)  S/P cervical spinal fusion     Problem List Patient Active Problem List   Diagnosis Date Noted   Cervical myelopathy ([place] [address]) 03/24/2021   Anemia 12/03/2020   Essential hypertension 12/03/2020   Hypothyroidism 12/03/2020   Type 2 diabetes mellitus ([place]) 12/03/2020   Atrial fibrillation ([place]) 09/21/2018   Kidney transplant status 09/21/2018   Osteoporosis 09/21/2018   Valentina Gu, PT, DPT #[id number]  Eilleen Kempf, PT 05/05/2021, 9:29 AM  [place] [facility] [address]. [place], [place], [address] Phone: [phone]   Fax:  [phone]  Name: Jean Johnson MRN: [id number] Date of Birth: [birth year]

## 2021-05-05 ENCOUNTER — Encounter: Payer: Self-pay | Admitting: Physical Therapy

## 2021-05-05 ENCOUNTER — Ambulatory Visit: Payer: Medicare Other | Admitting: Physical Therapy

## 2021-05-05 NOTE — Patient Instructions (Incomplete)
°  TREATMENT     Neuromuscular Re-education - for static and dynamic postural stability as needed to improve balance with performance of ADLs and household/community mobility tasks, equilibrium and non-equilibrium coordination to improve motor control with standing activities     In // bars: Forward and retro stepping; 3x D/B Standing toe tap; 6-inch step; 3x10 alternating  Sidestepping; 2x D/B length of parallel bars   *next visit* Half Tandem stance; 2x20sec, bilateral Standing feet apart, eyes closed; 2x30sec   *not today* Standing Romberg; 3x30sec       Therapeutic Exercise - improved strength as needed to improve performance of CKC activities/functional movements and as needed for improved power production to prevent fall during episode of large postural perturbation   Ambulate laps around gym; x 2 laps with rollator    Standing hip abduction; 2x5 alternating, bilateral LAQ; 2x10; 4-lb cuff weight, bilateral LE Seated hip abduction, black Tband; 2x10    In // bars: Standing heel raise/toe raise, alternating; 2x10      *not today* Seated march; 2x10 [heaviness with lifting LLE]       ASSESSMENT Patient demonstrates improving weight acceptance onto LLE that was significantly weakened from cervical myelopathy and deconditioning. She is still utilizing cervical brace until cleared by surgeon to wean from brace. She exhibits ongoing deficits in static postural control and gait stability. Patient is notably challenged with standing hip abduction drill with notable compensatory contralateral sidebend toward end of each set today. Patient discussed urinary impairments related to uterine prolapse with pelvic health SPT today; pt will follow up with her physician regarding potential referral for pelvic health PT to address these issues. Patient has remaining deficits in LE strength, postural stability, gait changes, mild UE coordination deficits, dec cervical spine AROM (s/p fusion  and currently in collar throughout the day), and difficulty with transferring and stair/obstacle negotiation.  Patient will benefit from continued skilled therapeutic intervention to address the above deficits as needed for improved function and QoL.

## 2021-05-06 ENCOUNTER — Encounter: Payer: 59 | Admitting: Physical Therapy

## 2021-05-11 ENCOUNTER — Ambulatory Visit: Payer: Medicare Other | Attending: Neurosurgery | Admitting: Physical Therapy

## 2021-05-11 ENCOUNTER — Other Ambulatory Visit: Payer: Self-pay

## 2021-05-11 DIAGNOSIS — Z981 Arthrodesis status: Secondary | ICD-10-CM | POA: Diagnosis present

## 2021-05-11 DIAGNOSIS — M6281 Muscle weakness (generalized): Secondary | ICD-10-CM | POA: Insufficient documentation

## 2021-05-11 DIAGNOSIS — R2689 Other abnormalities of gait and mobility: Secondary | ICD-10-CM | POA: Insufficient documentation

## 2021-05-11 DIAGNOSIS — R262 Difficulty in walking, not elsewhere classified: Secondary | ICD-10-CM | POA: Insufficient documentation

## 2021-05-11 NOTE — Therapy (Signed)
[facility] [address] [place], [place], [address] Phone: [phone]   Fax:  [phone]  Physical Therapy Treatment  Patient Details  Name: Jean Johnson MRN: [id number] Date of Birth: Apr 09, 1951 Referring Provider (PT): Meade Maw, MD   Encounter Date: 05/11/2021   PT End of Session - 05/12/21 1243     Visit Number 6    Number of Visits 17    Date for PT Re-Evaluation 06/10/21    Authorization Type Medicare and [employer] - VL based on medical necessity, no auth required    Authorization Time Period initial eval 83/3/82, Cert 50/5/39-76/73/41    Progress Note Due on Visit 10    PT Start Time 1427    PT Stop Time 1516    PT Time Calculation (min) 49 min    Equipment Utilized During Treatment Gait belt   rollator for negotiating clinic   Activity Tolerance Patient tolerated treatment well;Patient limited by fatigue    Behavior During Therapy [facility] for tasks assessed/performed             Past Medical History:  Diagnosis Date   Anemia of chronic renal disease    Aortic stenosis, mild    a.) TTE on 11/23/2020 --> mean gradient 9.7 mmHg   Atrial fibrillation ([place])    a.) CHA2DS2-VASc = 3 (age, sex, diabetes). b.) daily apixaban   B12 deficiency    Cervical spinal stenosis    Chronic anticoagulation    Apixaban   Diabetic neuropathy (HCC)    Diverticulosis    Dyspnea    ESRD (end stage renal disease) (HCC)    First degree AV block    HLD (hyperlipidemia)    Hypertension    Incomplete right bundle branch block (RBBB)    LAE (left atrial enlargement)    a.) TTE on 11/23/2020 --> moderate   Left thyroid nodule    a.) Cervical MRI on 12/29/2020 --> measured "at least" 3.5 cm; imcompletely imaged.   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   Valvular heart disease    a.) TTE 11/23/2020 --> mild panvalvular regurgitation    Past Surgical History:  Procedure Laterality Date   ANTERIOR CERVICAL DECOMP/DISCECTOMY FUSION N/A 03/24/2021   Procedure: C3-6 ANTERIOR CERVICAL DECOMPRESSION/DISCECTOMY FUSION 3 LEVELS;  Surgeon: Meade Maw, MD;  Location: [facility];  Service: Neurosurgery;  Laterality: N/A;   CATARACT EXTRACTION W/PHACO Left 10/21/2020   Procedure: CATARACT EXTRACTION PHACO AND INTRAOCULAR LENS PLACEMENT ([place]) DIABETES LEFT;  Surgeon: Leandrew Koyanagi, MD;  Location: [facility];  Service: Ophthalmology;  Laterality: Left;  2.84 0:48.8 5.8%   EYE SURGERY Right 2020   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   Subjective Assessment - 05/12/21 1235     Subjective Pt reports good follw-up with Dr. Izora Ribas - she was given exercises to complete including supine rotation, head raises in supine and sidelying, and scapular retractions.  The printout also has quadruped exercises, but the patient does not feel she can get into quadruped at this time. Patient reports soreness along her R hip at arrival to PT. She states she had fall in her kitchen this past Saturday and had notable pain Saturday - this improved by Sunday. She states it is much better today with mild soreness along R greater trochanter region remaining. She reports no major bruising or color change in the area since the incident Saturday.    Pertinent History Patient is a 70 year old female previously seen for dizziness and imbalance, currently s/p C3-6 ACDF  (DOS: 03/24/21). No significant complications post-op. She reports some irritation recently along upper periscapular region. Patient is currently donning neck brace until next follow-up with her surgeon. She is refraining from cervical AROM per instructions from physician.  Patient reports using brace the entire day except for completing hygiene tasks, eating, and sleeping. Patient currently has precautions for lifting > one gallon per report; physician's notes state no lifting > 10 pounds in first 6 weeks. Pt is precautioned from bending and stooping at this time. Patient does have remaining neuropathic symptoms on RUE/RLE. Patient reports no notable sciatic-type symptoms at this time. Patient reports no more urinary urgency/frequency than usual; she does occasionally feel hurried to get to the bathroom. Patient reports no fever, chills, night sweats. Pt has worked on gait with home health; she has walked as far 208 feet. She reports significant difficulty with stair negotiation at this time. Patient reports no stairs to enter/exit home. She has to negotiate 14 steps to get to her bedroom - handrail on L side. Pt has tub shower with shower bench; no grab bars. Pt has to negotiate gravel to get into her home. Pt reports having intermittent slight dizziness, but "not that much" recently.    Limitations Walking;House hold activities;Standing    Diagnostic tests Intraoperative fluoroscopy - no post-op imaging yet    Patient Stated Goals Able to walk with improved steadiness              OBJECTIVE FINDINGS  No ecchymosis, erythema, hematoma, palpable soft tissue defect, or edema along R hip/pelvic complex. No hip motion loss.   Patient is able to bear weight onto bilateral LE during gait  Leg length symmetrical bilaterally   AROM Cervical flexion:  50 deg Cervical extension: 15 deg Lateral flexion: Right 26 , Left 23 Cervical rotation: Right 32, Left 42 *Indicates pain        TREATMENT  Neuromuscular Re-education - for static and dynamic postural stability as needed to improve balance with performance of ADLs and household/community mobility tasks, equilibrium and non-equilibrium coordination to improve motor control with standing activities     In // bars: Forward and retro stepping; 3x D/B Standing toe tap; Airex; 2x10 alternating  [6-inch step next visit] Half Tandem stance; 2x30sec, bilateral 3/4 Tandem stance; 2x20sec, bilateral  *next visit* Standing feet apart, eyes closed; 2x30sec   *not today* Sidestepping; 2x D/B length of parallel bars Standing Romberg; 3x30sec       Therapeutic Exercise - improved strength as needed to improve performance of CKC activities/functional movements and as needed for improved power production to prevent fall during episode of large postural perturbation   Ambulate laps around gym; x 2 laps with rollator   Standing hip abduction; 2x5 alternating, bilateral   In // bars: Standing heel raise/toe raise, alternating; 2x10      *not today* LAQ; 2x10; 4-lb cuff weight, bilateral LE  Seated hip abduction, black Tband; 2x10  Seated march; 2x10 [heaviness with lifting LLE]       ASSESSMENT Patient has no signs of fracture, dislocation, or significant soft tissue injury following her fall this past Saturday. Patient is able to fully bear weight on bilateral lower extremities and pain/discomfort along hip complex has been improving since the weekend. She exhibits expected cervical spine AROM deficits given post-operative status with multi-level fusion. She does have significant stiffness with cervical spine rotation. She exhibits ongoing Trendelenburg sign L>R and gait instability. Patient has remaining deficits in LE strength, postural stability, gait changes, mild UE coordination deficits, dec cervical spine AROM (s/p fusion and currently in collar throughout the day), and difficulty with transferring and stair/obstacle  negotiation.  Patient will benefit from continued skilled therapeutic intervention to address the above deficits as needed for improved function and QoL.           PT Short Term Goals - 04/16/21 1446       PT SHORT TERM GOAL #1   Title Pt will be independent with HEP in order to improve strength and balance in order to decrease fall risk and improve function at home and work.    Baseline 04/15/21: HEP initiated    Time 3    Period Days    Status New    Target Date 05/06/21      PT SHORT TERM GOAL #2   Title Patient will demonstrate modified independent gait with SPC for home-mobility distance (150 ft or greater) in clinic with no LOB and safe management of AD    Baseline 04/15/21: Patient using rollator as primary means of mobility    Time 4    Period Weeks    Status New    Target Date 05/13/21               PT Long Term Goals - 04/16/21 1448       PT LONG TERM GOAL #1   Title Pt will improve BERG by at least 3 points in order to demonstrate clinically significant improvement in balance.    Baseline 04/15/21: Baseline BERG to be obtained next visit    Time 8    Period Weeks    Status New    Target Date 06/10/21      PT LONG TERM GOAL #2   Title Pt will decrease 5TSTS by at least 3 seconds in order to demonstrate clinically significant improvement in LE strength.    Baseline 04/15/21: 5TSTS 31 sec    Time 8    Period Weeks    Status New    Target Date 06/10/21      PT LONG TERM GOAL #3   Title Pt will decrease TUG to below 14 seconds/decrease in order to demonstrate decreased fall risk.    Baseline 04/15/21: TUG 25 sec    Time 8    Period Weeks    Status New    Target Date 06/10/21      PT LONG TERM GOAL #4   Title Patient will demonstrate improved function as evidenced by a score of 64 on FOTO measure for full participation in activities at home and in the community.    Baseline 04/15/21: FOTO 53    Time 8    Period Weeks    Status New    Target Date  06/10/21                   Plan - 05/12/21 1253  Clinical Impression Statement Patient has no signs of fracture, dislocation, or significant soft tissue injury following her fall this past Saturday. Patient is able to fully bear weight on bilateral lower extremities and pain/discomfort along hip complex has been improving since the weekend. She exhibits expected cervical spine AROM deficits given post-operative status with multi-level fusion. She does have significant stiffness with cervical spine rotation. She exhibits ongoing Trendelenburg sign L>R and gait instability. Patient has remaining deficits in LE strength, postural stability, gait changes, mild UE coordination deficits, dec cervical spine AROM (s/p fusion and currently in collar throughout the day), and difficulty with transferring and stair/obstacle negotiation.  Patient will benefit from continued skilled therapeutic intervention to address the above deficits as needed for improved function and QoL.    Personal Factors and Comorbidities Age;Comorbidity 3+    Comorbidities chronic kidney disease, Type II DM, peripheral neuropathy, hypertension, Hx of A-fib, cervical spine stenosis s/p C3-6 fusion    Examination-Activity Limitations Bed Mobility;Stairs;Stand;Locomotion Level;Transfers;Bend;Dressing    Examination-Participation Restrictions Community Activity;Cleaning    Stability/Clinical Decision Making Evolving/Moderate complexity    Rehab Potential Good    PT Frequency 2x / week    PT Duration 8 weeks    PT Treatment/Interventions Gait training;Stair training;Functional mobility training;Therapeutic activities;Therapeutic exercise;Balance training;Neuromuscular re-education;Patient/family education;ADLs/Self Care Home Management;Cryotherapy;Electrical Stimulation;Moist Heat    PT Next Visit Plan Continue with gait and balance re-training, LE strengthening. UE coordination work in sitting/standing. Continue strict observance  of post-op restrictions.    PT Home Exercise Plan Access Code: [email or web address]    Consulted and Agree with Plan of Care Patient             Patient will benefit from skilled therapeutic intervention in order to improve the following deficits and impairments:  Abnormal gait, Postural dysfunction, Difficulty walking, Decreased balance, Decreased coordination, Decreased strength, Decreased activity tolerance  Visit Diagnosis: Difficulty in walking, not elsewhere classified  Imbalance  Muscle weakness (generalized)  S/P cervical spinal fusion     Problem List Patient Active Problem List   Diagnosis Date Noted   Cervical myelopathy ([place]) 03/24/2021   Anemia 12/03/2020   Essential hypertension 12/03/2020   Hypothyroidism 12/03/2020   Type 2 diabetes mellitus ([place]) 12/03/2020   Atrial fibrillation ([place]) 09/21/2018   Kidney transplant status 09/21/2018   Osteoporosis 09/21/2018   Valentina Gu, PT, DPT #[id number]  Eilleen Kempf, PT 05/12/2021, 12:54 PM  [facility] [address] [place], [place], [address] Phone: [phone]   Fax:  [phone]  Name: Jean Johnson MRN: [id number] Date of Birth: 1951-02-24

## 2021-05-12 ENCOUNTER — Encounter: Payer: Self-pay | Admitting: Physical Therapy

## 2021-05-13 ENCOUNTER — Other Ambulatory Visit: Payer: Self-pay

## 2021-05-13 ENCOUNTER — Ambulatory Visit: Payer: Medicare Other | Admitting: Physical Therapy

## 2021-05-13 DIAGNOSIS — R262 Difficulty in walking, not elsewhere classified: Secondary | ICD-10-CM

## 2021-05-13 DIAGNOSIS — R2689 Other abnormalities of gait and mobility: Secondary | ICD-10-CM

## 2021-05-13 DIAGNOSIS — M6281 Muscle weakness (generalized): Secondary | ICD-10-CM

## 2021-05-13 DIAGNOSIS — Z981 Arthrodesis status: Secondary | ICD-10-CM

## 2021-05-13 NOTE — Therapy (Signed)
[facility] [address]. [place], [place], [address] Phone: [phone]   Fax:  [phone]  Physical Therapy Treatment  Patient Details  Name: Jean Johnson MRN: [id number] Date of Birth: Feb 08, 1951 Referring Provider (PT): Meade Maw, MD   Encounter Date: 05/13/2021   PT End of Session - 05/15/21 0953     Visit Number 7    Number of Visits 17    Date for PT Re-Evaluation 06/10/21    Authorization Type Medicare and [employer] - VL based on medical necessity, no auth required    Authorization Time Period initial eval 33/8/25, Cert 11/11/95-67/34/19    Progress Note Due on Visit 10    PT Start Time 1430    PT Stop Time 1520    PT Time Calculation (min) 50 min    Equipment Utilized During Treatment Gait belt   rollator for negotiating clinic   Activity Tolerance Patient tolerated treatment well;Patient limited by fatigue    Behavior During Therapy [facility] for tasks assessed/performed             Past Medical History:  Diagnosis Date   Anemia of chronic renal disease    Aortic stenosis, mild    a.) TTE on 11/23/2020 --> mean gradient 9.7 mmHg   Atrial fibrillation ([place])    a.) CHA2DS2-VASc = 3 (age, sex, diabetes). b.) daily apixaban   B12 deficiency    Cervical spinal stenosis    Chronic anticoagulation    Apixaban   Diabetic neuropathy (HCC)    Diverticulosis    Dyspnea    ESRD (end stage renal disease) (HCC)    First degree AV block    HLD (hyperlipidemia)    Hypertension    Incomplete right bundle branch block (RBBB)    LAE (left atrial enlargement)    a.) TTE on 11/23/2020 --> moderate   Left thyroid nodule    a.) Cervical MRI on 12/29/2020 --> measured "at least" 3.5 cm; imcompletely imaged.   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   Valvular heart disease    a.) TTE 11/23/2020 --> mild panvalvular regurgitation    Past Surgical History:  Procedure Laterality Date   ANTERIOR CERVICAL DECOMP/DISCECTOMY FUSION N/A 03/24/2021   Procedure: C3-6 ANTERIOR CERVICAL DECOMPRESSION/DISCECTOMY FUSION 3 LEVELS;  Surgeon: Meade Maw, MD;  Location: [facility];  Service: Neurosurgery;  Laterality: N/A;   CATARACT EXTRACTION W/PHACO Left 10/21/2020   Procedure: CATARACT EXTRACTION PHACO AND INTRAOCULAR LENS PLACEMENT ([place]) DIABETES LEFT;  Surgeon: Leandrew Koyanagi, MD;  Location: [place];  Service: Ophthalmology;  Laterality: Left;  2.84 0:48.8 5.8%   EYE SURGERY Right 2020   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   Subjective Assessment - 05/15/21 0951     Subjective Patient reports some dizziness, feeling of her vision being unsteady/shaky. Patient denies faintness/syncope. She reports mild L-sided feeling of cervical "strain"/discomfort along L paracervical region. No  more recent falls. Pt states she has not yet initiated home exercises given by her surgeon.    Pertinent History Patient is a 70 year old female previously seen for dizziness and imbalance, currently s/p C3-6 ACDF (DOS: 03/24/21). No significant complications post-op. She reports some irritation recently along upper periscapular region. Patient is currently donning neck brace until next follow-up with her surgeon. She is refraining from cervical AROM per instructions from physician.  Patient reports using brace the entire day except for completing hygiene  tasks, eating, and sleeping. Patient currently has precautions for lifting > one gallon per report; physician's notes state no lifting > 10 pounds in first 6 weeks. Pt is precautioned from bending and stooping at this time. Patient does have remaining neuropathic symptoms on RUE/RLE. Patient reports no notable sciatic-type symptoms at this time. Patient reports no more urinary urgency/frequency than usual; she does occasionally feel hurried to get to the bathroom. Patient reports no fever, chills, night sweats. Pt has worked on gait with home health; she has walked as far 208 feet. She reports significant difficulty with stair negotiation at this time. Patient reports no stairs to enter/exit home. She has to negotiate 14 steps to get to her bedroom - handrail on L side. Pt has tub shower with shower bench; no grab bars. Pt has to negotiate gravel to get into her home. Pt reports having intermittent slight dizziness, but "not that much" recently.    Limitations Walking;House hold activities;Standing    Diagnostic tests Intraoperative fluoroscopy - no post-op imaging yet    Patient Stated Goals Able to walk with improved steadiness                TREATMENT  Manual Therapy - for symptom modulation, soft tissue sensitivity and mobility  STM L UT/L C4-6 splenuis cervicis    Neuromuscular Re-education - for static and dynamic postural stability as needed to improve balance with performance of ADLs and household/community mobility tasks, equilibrium and non-equilibrium coordination to improve motor control with standing activities     In // bars: Forward and retro stepping; 5x D/B Standing toe tap; 2x10 alternating, 6-inch step 3/4 Tandem stance; 2x20sec, bilateral Standing feet together, eyes closed; 3x15sec    *not today* Half Tandem stance; 2x30sec, bilateral Sidestepping; 2x D/B length of parallel bars Standing Romberg; 3x30sec       Therapeutic Exercise - improved strength as  needed to improve performance of CKC activities/functional movements and as needed for improved power production to prevent fall during episode of large postural perturbation   Ambulate laps around gym; x 2 laps with rollator   Standing hip abduction; 2x6 alternating, bilateral  LAQ; 2x10; 5-lb cuff weight, bilateral LE   In // bars: Standing heel raise/toe raise, alternating; 2x10    Pt edu: Reviewed exercises given by surgeon and emphasized gentle UT stretching for L paracervical tightness.     *not today* Seated hip abduction, black Tband; 2x10  Seated march; 2x10 [heaviness with lifting LLE]     MHP (unbilled) utilized post-treatment for analgesic effect and improved soft tissue extensibility, x 5 minutes along L UT     ASSESSMENT Patient has gradually improving ability to bear weight onto bilateral LE. She still has ongoing Trendelenburg, difficulty with L lower limb weight acceptance, and significant postural control deficits. Pt reports some occular impairments with her visual field appearing unstable; pt will be following up with optometrist regarding these complaints. Neck pain is mild at this time following cervical spine fusion. Patient has  remaining deficits in LE strength, postural stability, gait changes, mild UE coordination deficits, dec cervical spine AROM (s/p fusion and currently in collar throughout the day), and difficulty with transferring and stair/obstacle negotiation.  Patient will benefit from continued skilled therapeutic intervention to address the above deficits as needed for improved function and QoL.       PT Short Term Goals - 04/16/21 1446       PT SHORT TERM GOAL #1   Title Pt will be independent with HEP in order to improve strength and balance in order to decrease fall risk and improve function at home and work.    Baseline 04/15/21: HEP initiated    Time 3    Period Days    Status New    Target Date 05/06/21      PT SHORT TERM GOAL #2   Title  Patient will demonstrate modified independent gait with SPC for home-mobility distance (150 ft or greater) in clinic with no LOB and safe management of AD    Baseline 04/15/21: Patient using rollator as primary means of mobility    Time 4    Period Weeks    Status New    Target Date 05/13/21               PT Long Term Goals - 04/16/21 1448       PT LONG TERM GOAL #1   Title Pt will improve BERG by at least 3 points in order to demonstrate clinically significant improvement in balance.    Baseline 04/15/21: Baseline BERG to be obtained next visit    Time 8    Period Weeks    Status New    Target Date 06/10/21      PT LONG TERM GOAL #2   Title Pt will decrease 5TSTS by at least 3 seconds in order to demonstrate clinically significant improvement in LE strength.    Baseline 04/15/21: 5TSTS 31 sec    Time 8    Period Weeks    Status New    Target Date 06/10/21      PT LONG TERM GOAL #3   Title Pt will decrease TUG to below 14 seconds/decrease in order to demonstrate decreased fall risk.    Baseline 04/15/21: TUG 25 sec    Time 8    Period Weeks    Status New    Target Date 06/10/21      PT LONG TERM GOAL #4   Title Patient will demonstrate improved function as evidenced by a score of 64 on FOTO measure for full participation in activities at home and in the community.    Baseline 04/15/21: FOTO 53    Time 8    Period Weeks    Status New    Target Date 06/10/21                   Plan - 05/15/21 0957     Clinical Impression Statement Patient has gradually improving ability to bear weight onto bilateral LE. She still has ongoing Trendelenburg, difficulty with L lower limb weight acceptance, and significant postural control deficits. Pt reports some occular impairments with her visual field appearing unstable; pt will be following up with optometrist regarding these complaints. Neck pain is mild at this time following cervical spine fusion. Patient has remaining  deficits in LE strength, postural stability, gait changes, mild UE coordination deficits, dec cervical spine AROM (s/p fusion and currently in collar throughout the day), and difficulty with  transferring and stair/obstacle negotiation.  Patient will benefit from continued skilled therapeutic intervention to address the above deficits as needed for improved function and QoL.    Personal Factors and Comorbidities Age;Comorbidity 3+    Comorbidities chronic kidney disease, Type II DM, peripheral neuropathy, hypertension, Hx of A-fib, cervical spine stenosis s/p C3-6 fusion    Examination-Activity Limitations Bed Mobility;Stairs;Stand;Locomotion Level;Transfers;Bend;Dressing    Examination-Participation Restrictions Community Activity;Cleaning    Stability/Clinical Decision Making Evolving/Moderate complexity    Rehab Potential Good    PT Frequency 2x / week    PT Duration 8 weeks    PT Treatment/Interventions Gait training;Stair training;Functional mobility training;Therapeutic activities;Therapeutic exercise;Balance training;Neuromuscular re-education;Patient/family education;ADLs/Self Care Home Management;Cryotherapy;Electrical Stimulation;Moist Heat    PT Next Visit Plan Continue with gait and balance re-training, LE strengthening. UE coordination work in sitting/standing. Continue strict observance of post-op restrictions.    PT Home Exercise Plan Access Code: [email or web address]    Consulted and Agree with Plan of Care Patient             Patient will benefit from skilled therapeutic intervention in order to improve the following deficits and impairments:  Abnormal gait, Postural dysfunction, Difficulty walking, Decreased balance, Decreased coordination, Decreased strength, Decreased activity tolerance  Visit Diagnosis: Difficulty in walking, not elsewhere classified  Imbalance  Muscle weakness (generalized)  S/P cervical spinal fusion     Problem List Patient Active Problem List    Diagnosis Date Noted   Cervical myelopathy ([place]) 03/24/2021   Anemia 12/03/2020   Essential hypertension 12/03/2020   Hypothyroidism 12/03/2020   Type 2 diabetes mellitus ([place]) 12/03/2020   Atrial fibrillation ([place]) 09/21/2018   Kidney transplant status 09/21/2018   Osteoporosis 09/21/2018   Valentina Gu, PT, DPT #[id number]  Eilleen Kempf, PT 05/15/2021, 9:57 AM  [facility] [address]. [place], [place], [address] Phone: [phone]   Fax:  [phone]  Name: Jean Johnson MRN: [id number] Date of Birth: 08-Apr-1951

## 2021-05-15 ENCOUNTER — Encounter: Payer: Self-pay | Admitting: Physical Therapy

## 2021-05-18 ENCOUNTER — Encounter: Payer: Self-pay | Admitting: Physical Therapy

## 2021-05-18 ENCOUNTER — Ambulatory Visit: Payer: Medicare Other | Admitting: Physical Therapy

## 2021-05-18 ENCOUNTER — Other Ambulatory Visit: Payer: Self-pay

## 2021-05-18 DIAGNOSIS — R2689 Other abnormalities of gait and mobility: Secondary | ICD-10-CM

## 2021-05-18 DIAGNOSIS — M6281 Muscle weakness (generalized): Secondary | ICD-10-CM

## 2021-05-18 DIAGNOSIS — R262 Difficulty in walking, not elsewhere classified: Secondary | ICD-10-CM | POA: Diagnosis not present

## 2021-05-18 DIAGNOSIS — Z981 Arthrodesis status: Secondary | ICD-10-CM

## 2021-05-18 NOTE — Therapy (Signed)
[facility] [address]. [place], [place], [address] Phone: [phone]   Fax:  [phone]  Physical Therapy Treatment/ Physical Therapy Progress Note   Dates of reporting period  04/15/21   to   05/18/21   Patient Details  Name: Jean Johnson MRN: [id number] Date of Birth: 1950/07/25 Referring Provider (PT): Meade Maw, MD   Encounter Date: 05/18/2021   PT End of Session - 05/18/21 1435     Visit Number 8    Number of Visits 17    Date for PT Re-Evaluation 06/10/21    Authorization Type Medicare and [employer] - VL based on medical necessity, no auth required    Authorization Time Period initial eval 04/15/21, progress note 05/18/21; Cert 77/8/24-23/53/61    Progress Note Due on Visit 10    PT Start Time 1430    PT Stop Time 1515    PT Time Calculation (min) 45 min    Equipment Utilized During Treatment Gait belt   rollator for negotiating clinic   Activity Tolerance Patient tolerated treatment well;Patient limited by fatigue    Behavior During Therapy [facility] for tasks assessed/performed             Past Medical History:  Diagnosis Date   Anemia of chronic renal disease    Aortic stenosis, mild    a.) TTE on 11/23/2020 --> mean gradient 9.7 mmHg   Atrial fibrillation (HCC)    a.) CHA2DS2-VASc = 3 (age, sex, diabetes). b.) daily apixaban   B12 deficiency    Cervical spinal stenosis    Chronic anticoagulation    Apixaban   Diabetic neuropathy (HCC)    Diverticulosis    Dyspnea    ESRD (end stage renal disease) (HCC)    First degree AV block    HLD (hyperlipidemia)    Hypertension    Incomplete right bundle branch block (RBBB)    LAE (left atrial enlargement)    a.) TTE on 11/23/2020 --> moderate   Left thyroid nodule    a.) Cervical MRI on 12/29/2020 --> measured "at least" 3.5 cm; imcompletely imaged.   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   Valvular heart disease    a.) TTE 11/23/2020 --> mild panvalvular regurgitation    Past Surgical History:  Procedure Laterality Date   ANTERIOR CERVICAL DECOMP/DISCECTOMY FUSION N/A 03/24/2021   Procedure: C3-6 ANTERIOR CERVICAL DECOMPRESSION/DISCECTOMY FUSION 3 LEVELS;  Surgeon: Meade Maw, MD;  Location: [facility];  Service: Neurosurgery;  Laterality: N/A;   CATARACT EXTRACTION W/PHACO Left 10/21/2020   Procedure: CATARACT EXTRACTION PHACO AND INTRAOCULAR LENS PLACEMENT ([place]) DIABETES LEFT;  Surgeon: Leandrew Koyanagi, MD;  Location: [place];  Service: Ophthalmology;  Laterality: Left;  2.84 0:48.8 5.8%   EYE SURGERY Right 2020   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   Subjective Assessment - 05/18/21 1434     Subjective Patient reports feeling significantly  better compared to beginning of PT. Patient still feels she needs to make a lot more progress. Patient reports initiating exercises given by her surgeon. 50% SANE score. Patient reports notable hip weakness and L hip discomfort with walking. Patient wants to be able to walk without significant reliance on assistive device. Patient reports using SPC when walking in home with her husband. Patient reports some difficulty with gripping with R hand given altered sensation/numbness. Pt is R hand dominant.    Pertinent History Patient is a 70 year old female previously seen for dizziness and imbalance, currently s/p  C3-6 ACDF (DOS: 03/24/21). No significant complications post-op. She reports some irritation recently along upper periscapular region. Patient is currently donning neck brace until next follow-up with her surgeon. She is refraining from cervical AROM per instructions from physician.  Patient reports using brace the entire day except for completing hygiene tasks, eating, and sleeping. Patient currently has precautions for lifting > one gallon per report; physician's notes state no lifting > 10 pounds in first 6 weeks. Pt is precautioned from bending and stooping at this time. Patient does have remaining neuropathic symptoms on RUE/RLE. Patient reports no notable sciatic-type symptoms at this time. Patient reports no more urinary urgency/frequency than usual; she does occasionally feel hurried to get to the bathroom. Patient reports no fever, chills, night sweats. Pt has worked on gait with home health; she has walked as far 208 feet. She reports significant difficulty with stair negotiation at this time. Patient reports no stairs to enter/exit home. She has to negotiate 14 steps to get to her bedroom - handrail on L side. Pt has tub shower with shower bench; no grab bars. Pt has to negotiate gravel to get into her home. Pt reports having intermittent slight dizziness, but "not that much" recently.    Limitations Walking;House hold activities;Standing    Diagnostic tests Intraoperative fluoroscopy - no post-op imaging yet    Patient Stated Goals Able to walk with improved steadiness               [facility] PT Assessment - 05/19/21 0001       Berg Balance Test   Sit to Stand Able to stand  independently using hands    Standing Unsupported Able to stand safely 2 minutes    Sitting with Back Unsupported but Feet Supported on Floor or Stool Able to sit safely and securely 2 minutes    Stand to Sit Uses backs of legs against chair to control descent    Transfers Able to transfer safely, minor use of hands     Standing Unsupported with Eyes Closed Able to stand 10 seconds safely    Standing Unsupported with Feet Together Able to place feet together independently and stand 1 minute safely    From Standing, Reach Forward with Outstretched Arm Can reach forward >12 cm safely (5")    From Standing Position, Pick up Object from Floor Able to pick up shoe, needs supervision    From Standing Position, Turn to Look Behind Over each Shoulder Looks behind from both sides and weight shifts well    Turn 360 Degrees Able to turn 360 degrees safely but slowly    Standing Unsupported, Alternately Place Feet on Step/Stool Needs assistance to keep from falling or unable to try    Standing Unsupported, One Foot in [employer] balance while stepping or standing    Standing on One Leg Able to lift leg independently and hold 5-10 seconds    Total  Score 40               OBJECTIVE FINDINGS  Gait Ambulating with rollator, forward lean onto AD; Decreased stance time LLE, mild Trendelenburg L, decreased single-limb support time bilaterally   Strength R/L 4/4 Hip flexion 4+/4+ Hip abduction (seated) 5/5 Hip adduction 4+/4 Knee extension 4/4- Knee flexion 5/4+ Ankle Dorsiflexion 4/4 Ankle Plantarflexion *indicates pain   AROM Cervical flexion:  40 Cervical extension: 32 Lateral flexion: Right 32, Left 21* Cervical rotation: Right 52, Left 48 *Indicates pain        FUNCTIONAL OUTCOME MEASURES       Results Comments  BERG 04/19/21: 35/56 05/18/21: 40/56 Fall risk, in need of intervention  TUG 04/15/21: 25 seconds 05/18/21: 22.6 seconds    5TSTS 04/15/21: 31 seconds 05/18/21: 27 seconds    10 Meter Gait Speed 04/15/21: Self-selected: s =  0.67 m/s;     05/18/21: Self-selected: s = 0.52 m/s Below normative values for full community ambulation      POSTURAL CONTROL TESTING            Rhomberg: Eyes Open 30 seconds, Eyes closed 8 seconds       TREATMENT   Physical Performance  Testing  Performance of functional outcome measures, gait assessment, strength and ROM testing.  Patient education on results obtained and goals of PT moving forward     Neuromuscular Re-education - for static and dynamic postural stability as needed to improve balance with performance of ADLs and household/community mobility tasks, equilibrium and non-equilibrium coordination to improve motor control with standing activities   In // bars: Forward and retro stepping; 5x D/B Standing toe tap; 2x10 alternating, 6-inch step   *next visit* 3/4 Tandem stance; 2x20sec, bilateral Standing feet together, eyes closed; 3x15sec   *not today* Half Tandem stance; 2x30sec, bilateral Sidestepping; 2x D/B length of parallel bars Standing Romberg; 3x30sec       Therapeutic Exercise - improved strength as needed to improve performance of CKC activities/functional movements and as needed for improved power production to prevent fall during episode of large postural perturbation    Ambulate laps around gym; x 3 laps with rollator     *next visit* Standing hip abduction; 2x6 alternating, bilateral LAQ; 2x10; 5-lb cuff weight, bilateral LE In // bars: Standing heel raise/toe raise, alternating; 2x10     *not today* Seated hip abduction, black Tband; 2x10  Seated march; 2x10 [heaviness with lifting LLE]         ASSESSMENT Patient has clinically significant improvement in BERG and 5-times sit to stand. She has improved FOTO score, but she has not yet met long-term goal for FOTO. She has modestly improved manual muscle tests, but she has ongoing significant L>RLE strength. Pt exhibits ongoing Trendelenburg sign with gait and gait instability. She has improved TUG score, but still needs significant improvement to meet TUG cut-off score. She demonstrates slower self-selected gait speed today relative to her initial evaluation. She is currently able to take her collar off for performing home  exercises, but is still using her cervical collar for most of the day with exception of hygiene/bathing activities and eating. Patient has remaining deficits in LE strength, postural stability, gait changes, mild UE coordination deficits, dec cervical spine AROM (s/p fusion and currently in cervical spine collar throughout the day), and difficulty with transferring and stair/obstacle negotiation.  Patient will benefit from continued skilled therapeutic intervention to address the above deficits as needed for improved function and QoL.  PT Short Term Goals - 05/19/21 1330       PT SHORT TERM GOAL #1   Title Pt will be independent with HEP in order to improve strength and balance in order to decrease fall risk and improve function at home and work.    Baseline 04/15/21: HEP initiated.   05/19/21: Pt is complaint with HEP given in PT, initiating home exercises from surgeon this week    Time 3    Period Days    Status Achieved    Target Date 05/06/21      PT SHORT TERM GOAL #2   Title Patient will demonstrate modified independent gait with SPC for home-mobility distance (150 ft or greater) in clinic with no LOB and safe management of AD    Baseline 04/15/21: Patient using rollator as primary means of mobility.   05/19/21: Pt still using rollator as primary means of mobility; she has demonstrated gait in parallel bars with no AD with intermittent LOB requiring minA to maintain balance; will practice SPC ambulation next visit    Time 4    Period Weeks    Status Not Met    Target Date 05/13/21               PT Long Term Goals - 05/19/21 1332       PT LONG TERM GOAL #1   Title Pt will improve BERG by at least 3 points in order to demonstrate clinically significant improvement in balance.    Baseline 04/15/21: Baseline BERG to be obtained next visit.   04/19/21: 35/56.  05/18/21: 40/56    Time 8    Period Weeks    Status Achieved    Target Date 05/18/21      PT LONG TERM GOAL #2    Title Pt will decrease 5TSTS by at least 3 seconds in order to demonstrate clinically significant improvement in LE strength.    Baseline 04/15/21: 5TSTS 31 sec.   05/19/21: Improved by 4 seconds    Time 8    Period Weeks    Status Achieved    Target Date 05/18/21      PT LONG TERM GOAL #3   Title Pt will decrease TUG to below 14 seconds/decrease in order to demonstrate decreased fall risk.    Baseline 04/15/21: TUG 25 sec.   05/18/21: TUG 22.6 sec    Time 8    Period Weeks    Status Not Met    Target Date 06/10/21      PT LONG TERM GOAL #4   Title Patient will demonstrate improved function as evidenced by a score of 64 on FOTO measure for full participation in activities at home and in the community.    Baseline 04/15/21: FOTO 53.   05/18/21: FOTO 58    Time 8    Period Weeks    Status Not Met    Target Date 06/10/21                   Plan - 05/19/21 1343     Clinical Impression Statement Patient has clinically significant improvement in BERG and 5-times sit to stand. She has improved FOTO score, but she has not yet met long-term goal for FOTO. She has modestly improved manual muscle tests, but she has ongoing significant L>RLE strength. Pt exhibits ongoing Trendelenburg sign with gait and gait instability. She has improved TUG score, but still needs significant improvement to meet TUG cut-off score. She demonstrates slower self-selected  gait speed today relative to her initial evaluation. She is currently able to take her collar off for performing home exercises, but is still using her cervical collar for most of the day with exception of hygiene/bathing activities and eating. Patient has remaining deficits in LE strength, postural stability, gait changes, mild UE coordination deficits, dec cervical spine AROM (s/p fusion and currently in cervical spine collar throughout the day), and difficulty with transferring and stair/obstacle negotiation.  Patient will benefit from continued  skilled therapeutic intervention to address the above deficits as needed for improved function and QoL.    Personal Factors and Comorbidities Age;Comorbidity 3+    Comorbidities chronic kidney disease, Type II DM, peripheral neuropathy, hypertension, Hx of A-fib, cervical spine stenosis s/p C3-6 fusion    Examination-Activity Limitations Bed Mobility;Stairs;Stand;Locomotion Level;Transfers;Bend;Dressing    Examination-Participation Restrictions Community Activity;Cleaning    Stability/Clinical Decision Making Evolving/Moderate complexity    Rehab Potential Good    PT Frequency 2x / week    PT Duration 8 weeks    PT Treatment/Interventions Gait training;Stair training;Functional mobility training;Therapeutic activities;Therapeutic exercise;Balance training;Neuromuscular re-education;Patient/family education;ADLs/Self Care Home Management;Cryotherapy;Electrical Stimulation;Moist Heat    PT Next Visit Plan Continue with gait and balance re-training, LE strengthening. UE coordination work in sitting/standing. Continue strict observance of post-op restrictions. Pt continuing with cervical AROM drills given by surgeon; may use MT for paracervical sensitivity and soft tissue mobility as needed.    PT Home Exercise Plan Access Code: [email or web address]    Consulted and Agree with Plan of Care Patient             Patient will benefit from skilled therapeutic intervention in order to improve the following deficits and impairments:  Abnormal gait, Postural dysfunction, Difficulty walking, Decreased balance, Decreased coordination, Decreased strength, Decreased activity tolerance  Visit Diagnosis: Difficulty in walking, not elsewhere classified  Imbalance  Muscle weakness (generalized)  S/P cervical spinal fusion     Problem List Patient Active Problem List   Diagnosis Date Noted   Cervical myelopathy ([place]) 03/24/2021   Anemia 12/03/2020   Essential hypertension 12/03/2020   Hypothyroidism  12/03/2020   Type 2 diabetes mellitus ([place]) 12/03/2020   Atrial fibrillation ([place]) 09/21/2018   Kidney transplant status 09/21/2018   Osteoporosis 09/21/2018   Valentina Gu, PT, DPT #[id number]  Eilleen Kempf, PT 05/19/2021, 1:43 PM  [facility] [address] [place], [place], [address] Phone: [phone]   Fax:  [phone]  Name: Jean Johnson MRN: [id number] Date of Birth: 02/04/1951

## 2021-05-20 ENCOUNTER — Other Ambulatory Visit: Payer: Self-pay

## 2021-05-20 ENCOUNTER — Ambulatory Visit: Payer: Medicare Other | Admitting: Physical Therapy

## 2021-05-20 VITALS — BP 148/51 | HR 66

## 2021-05-20 DIAGNOSIS — R2689 Other abnormalities of gait and mobility: Secondary | ICD-10-CM

## 2021-05-20 DIAGNOSIS — Z981 Arthrodesis status: Secondary | ICD-10-CM

## 2021-05-20 DIAGNOSIS — R262 Difficulty in walking, not elsewhere classified: Secondary | ICD-10-CM | POA: Diagnosis not present

## 2021-05-20 DIAGNOSIS — M6281 Muscle weakness (generalized): Secondary | ICD-10-CM

## 2021-05-20 NOTE — Therapy (Signed)
[facility] [address] [place], [place], [address] Phone: [phone]   Fax:  [phone]  Physical Therapy Treatment  Patient Details  Name: Jean Johnson MRN: [id number] Date of Birth: 08/13/1950 Referring Provider (PT): Meade Maw, MD   Encounter Date: 05/20/2021   PT End of Session - 05/20/21 1649     Visit Number 9    Number of Visits 17    Date for PT Re-Evaluation 06/10/21    Authorization Type Medicare and [employer] - VL based on medical necessity, no auth required    Authorization Time Period initial eval 04/15/21, progress note 05/18/21; Cert 44/0/10-27/25/36    Progress Note Due on Visit 17    PT Start Time 1432    PT Stop Time 1515    PT Time Calculation (min) 43 min    Equipment Utilized During Treatment Gait belt   rollator for negotiating clinic   Activity Tolerance Patient tolerated treatment well;Patient limited by fatigue    Behavior During Therapy [facility] for tasks assessed/performed             Past Medical History:  Diagnosis Date   Anemia of chronic renal disease    Aortic stenosis, mild    a.) TTE on 11/23/2020 --> mean gradient 9.7 mmHg   Atrial fibrillation (HCC)    a.) CHA2DS2-VASc = 3 (age, sex, diabetes). b.) daily apixaban   B12 deficiency    Cervical spinal stenosis    Chronic anticoagulation    Apixaban   Diabetic neuropathy (HCC)    Diverticulosis    Dyspnea    ESRD (end stage renal disease) (HCC)    First degree AV block    HLD (hyperlipidemia)    Hypertension    Incomplete right bundle branch block (RBBB)    LAE (left atrial enlargement)    a.) TTE on 11/23/2020 --> moderate   Left thyroid nodule    a.) Cervical MRI on 12/29/2020 --> measured "at least" 3.5 cm; imcompletely imaged.   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   Valvular heart disease    a.) TTE 11/23/2020 --> mild panvalvular regurgitation    Past Surgical History:  Procedure Laterality Date   ANTERIOR CERVICAL DECOMP/DISCECTOMY FUSION N/A 03/24/2021   Procedure: C3-6 ANTERIOR CERVICAL DECOMPRESSION/DISCECTOMY FUSION 3 LEVELS;  Surgeon: Meade Maw, MD;  Location: [facility];  Service: Neurosurgery;  Laterality: N/A;   CATARACT EXTRACTION W/PHACO Left 10/21/2020   Procedure: CATARACT EXTRACTION PHACO AND INTRAOCULAR LENS PLACEMENT ([place]) DIABETES LEFT;  Surgeon: Leandrew Koyanagi, MD;  Location: [place];  Service: Ophthalmology;  Laterality: Left;  2.84 0:48.8 5.8%   EYE SURGERY Right 2020   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   Living donor organ recipient (sister)   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   Cadaveric organ recipient    Vitals:   05/20/21 1442  BP: (!) 148/51  Pulse: 66  SpO2: 100%     Subjective Assessment - 05/20/21 1436     Subjective Patient reports spinning sensation when at rest that is continuous that she thinks may  be due to her medications. She states her blood glucose was around 74 this AM. Patient reports doing okay after therapy on Tuesday. Patient reports partial compliance with exercises given by her surgeon. She reports minimal upper quarter pain/tightness today.    Pertinent History Patient is a 70 year old female previously seen for dizziness and imbalance, currently s/p C3-6 ACDF (DOS: 03/24/21). No significant complications post-op. She reports some irritation recently along upper periscapular region. Patient is currently donning neck brace until next follow-up with her surgeon. She is refraining from cervical AROM  per instructions from physician.  Patient reports using brace the entire day except for completing hygiene tasks, eating, and sleeping. Patient currently has precautions for lifting > one gallon per report; physician's notes state no lifting > 10 pounds in first 6 weeks. Pt is precautioned from bending and stooping at this time. Patient does have remaining neuropathic symptoms on RUE/RLE. Patient reports no notable sciatic-type symptoms at this time. Patient reports no more urinary urgency/frequency than usual; she does occasionally feel hurried to get to the bathroom. Patient reports no fever, chills, night sweats. Pt has worked on gait with home health; she has walked as far 208 feet. She reports significant difficulty with stair negotiation at this time. Patient reports no stairs to enter/exit home. She has to negotiate 14 steps to get to her bedroom - handrail on L side. Pt has tub shower with shower bench; no grab bars. Pt has to negotiate gravel to get into her home. Pt reports having intermittent slight dizziness, but "not that much" recently.    Limitations Walking;House hold activities;Standing    Diagnostic tests Intraoperative fluoroscopy - no post-op imaging yet    Patient Stated Goals Able to walk with improved steadiness                TREATMENT     Neuromuscular Re-education - for static and dynamic postural stability as needed to improve balance with performance of ADLs and household/community mobility tasks, equilibrium and non-equilibrium coordination to improve motor control with standing activities   In // bars: Hurdle step (single foot step-over blue line with heel to toe) alternating feet; 2x10 alt Forward heel-to-toe walking; 3x D/B Standing toe tap; 2x10 alternating, 6-inch step 3/4 Tandem stance; 2x20sec, bilateral Standing feet together, eyes closed; 3x15sec    *not today* Forward and retro stepping; 5x D/B Half Tandem stance; 2x30sec, bilateral Sidestepping; 2x  D/B length of parallel bars Standing Romberg; 3x30sec       Therapeutic Exercise - improved strength as needed to improve performance of CKC activities/functional movements and as needed for improved power production to prevent fall during episode of large postural perturbation     Ambulate laps around gym; x 1 laps with rollator   Ambulate laps around gym; x 2 laps with SPC RUE   *next visit* Standing hip abduction; 2x6 alternating, bilateral LAQ; 2x10; 5-lb cuff weight, bilateral LE In // bars: Standing heel raise/toe raise, alternating; 2x10      *not today* Seated hip abduction, black Tband; 2x10  Seated march; 2x10 [heaviness with lifting LLE]         ASSESSMENT Patient reports notable continuous spinning that is not specific to change in position or rolling over in bed. She denies visual disturbance, N&V, or other major symptoms. Pt does not have notable nystagmus at rest. Previously established habituation exercises (given to pt prior to moving forward with ACDF) have ben put on hold due to post-op restrictions. Pt does have low  diastolic blood pressure today, but no symptoms consistent with orthostatic hypotension. Pt to f/u with her PCP regarding recent symptoms and any needed changes to her medications. Patient has remaining deficits in LE strength, postural stability, gait changes, mild UE coordination deficits, dec cervical spine AROM (s/p fusion and currently in cervical spine collar throughout the day), and difficulty with transferring and stair/obstacle negotiation.  Patient will benefit from continued skilled therapeutic intervention to address the above deficits as needed for improved function and QoL.         PT Short Term Goals - 05/19/21 1330       PT SHORT TERM GOAL #1   Title Pt will be independent with HEP in order to improve strength and balance in order to decrease fall risk and improve function at home and work.    Baseline 04/15/21: HEP initiated.    05/19/21: Pt is complaint with HEP given in PT, initiating home exercises from surgeon this week    Time 3    Period Days    Status Achieved    Target Date 05/06/21      PT SHORT TERM GOAL #2   Title Patient will demonstrate modified independent gait with SPC for home-mobility distance (150 ft or greater) in clinic with no LOB and safe management of AD    Baseline 04/15/21: Patient using rollator as primary means of mobility.   05/19/21: Pt still using rollator as primary means of mobility; she has demonstrated gait in parallel bars with no AD with intermittent LOB requiring minA to maintain balance; will practice SPC ambulation next visit    Time 4    Period Weeks    Status Not Met    Target Date 05/13/21               PT Long Term Goals - 05/19/21 1332       PT LONG TERM GOAL #1   Title Pt will improve BERG by at least 3 points in order to demonstrate clinically significant improvement in balance.    Baseline 04/15/21: Baseline BERG to be obtained next visit.   04/19/21: 35/56.  05/18/21: 40/56    Time 8    Period Weeks    Status Achieved    Target Date 05/18/21      PT LONG TERM GOAL #2   Title Pt will decrease 5TSTS by at least 3 seconds in order to demonstrate clinically significant improvement in LE strength.    Baseline 04/15/21: 5TSTS 31 sec.   05/19/21: Improved by 4 seconds    Time 8    Period Weeks    Status Achieved    Target Date 05/18/21      PT LONG TERM GOAL #3   Title Pt will decrease TUG to below 14 seconds/decrease in order to demonstrate decreased fall risk.    Baseline 04/15/21: TUG 25 sec.   05/18/21: TUG 22.6 sec    Time 8    Period Weeks    Status Not Met    Target Date 06/10/21      PT LONG TERM GOAL #4   Title Patient will demonstrate improved function as evidenced by a score of 64 on FOTO measure for full participation in activities at home and in the community.    Baseline 04/15/21: FOTO 53.   05/18/21: FOTO 58    Time 8    Period Weeks    Status  Not Met    Target Date 06/10/21  Plan - 05/20/21 1653     Clinical Impression Statement Patient reports notable continuous spinning that is not specific to change in position or rolling over in bed. She denies visual disturbance, N&V, or other major symptoms. Pt does not have notable nystagmus at rest. Previously established habituation exercises (given to pt prior to moving forward with ACDF) have ben put on hold due to post-op restrictions. Pt does have low diastolic blood pressure today, but no symptoms consistent with orthostatic hypotension. Pt to f/u with her PCP regarding recent symptoms and any needed changes to her medications. Patient has remaining deficits in LE strength, postural stability, gait changes, mild UE coordination deficits, dec cervical spine AROM (s/p fusion and currently in cervical spine collar throughout the day), and difficulty with transferring and stair/obstacle negotiation.  Patient will benefit from continued skilled therapeutic intervention to address the above deficits as needed for improved function and QoL.    Personal Factors and Comorbidities Age;Comorbidity 3+    Comorbidities chronic kidney disease, Type II DM, peripheral neuropathy, hypertension, Hx of A-fib, cervical spine stenosis s/p C3-6 fusion    Examination-Activity Limitations Bed Mobility;Stairs;Stand;Locomotion Level;Transfers;Bend;Dressing    Examination-Participation Restrictions Community Activity;Cleaning    Stability/Clinical Decision Making Evolving/Moderate complexity    Rehab Potential Good    PT Frequency 2x / week    PT Duration 8 weeks    PT Treatment/Interventions Gait training;Stair training;Functional mobility training;Therapeutic activities;Therapeutic exercise;Balance training;Neuromuscular re-education;Patient/family education;ADLs/Self Care Home Management;Cryotherapy;Electrical Stimulation;Moist Heat    PT Next Visit Plan Continue with gait and balance  re-training, LE strengthening. UE coordination work in sitting/standing. Continue strict observance of post-op restrictions. Pt continuing with cervical AROM drills given by surgeon; may use MT for paracervical sensitivity and soft tissue mobility as needed.    PT Home Exercise Plan Access Code: [email or web address]    Consulted and Agree with Plan of Care Patient             Patient will benefit from skilled therapeutic intervention in order to improve the following deficits and impairments:  Abnormal gait, Postural dysfunction, Difficulty walking, Decreased balance, Decreased coordination, Decreased strength, Decreased activity tolerance  Visit Diagnosis: Difficulty in walking, not elsewhere classified  Imbalance  Muscle weakness (generalized)  S/P cervical spinal fusion     Problem List Patient Active Problem List   Diagnosis Date Noted   Cervical myelopathy ([place]) 03/24/2021   Anemia 12/03/2020   Essential hypertension 12/03/2020   Hypothyroidism 12/03/2020   Type 2 diabetes mellitus ([place]) 12/03/2020   Atrial fibrillation ([place]) 09/21/2018   Kidney transplant status 09/21/2018   Osteoporosis 09/21/2018   Valentina Gu, PT, DPT #[id number]  Eilleen Kempf, PT 05/20/2021, 4:53 PM  [facility] [address] [place], [place], [address] Phone: [phone]   Fax:  [phone]  Name: Jean Johnson MRN: [id number] Date of Birth: December 17, 1950

## 2021-05-25 ENCOUNTER — Ambulatory Visit: Payer: Medicare Other | Admitting: Physical Therapy

## 2021-05-25 ENCOUNTER — Other Ambulatory Visit: Payer: Self-pay

## 2021-05-25 DIAGNOSIS — R262 Difficulty in walking, not elsewhere classified: Secondary | ICD-10-CM

## 2021-05-25 DIAGNOSIS — Z981 Arthrodesis status: Secondary | ICD-10-CM

## 2021-05-25 DIAGNOSIS — R2689 Other abnormalities of gait and mobility: Secondary | ICD-10-CM

## 2021-05-25 DIAGNOSIS — M6281 Muscle weakness (generalized): Secondary | ICD-10-CM

## 2021-05-25 NOTE — Therapy (Signed)
[place] [facility] [address] [place], [place], [address] Phone: [phone]   Fax:  [phone]  Physical Therapy Treatment  Patient Details  Name: Jean Johnson MRN: [id number] Date of Birth: 1951-01-26 Referring Provider (PT): Meade Maw, MD   Encounter Date: 05/25/2021   PT End of Session - 05/25/21 1506     Visit Number 10    Number of Visits 17    Date for PT Re-Evaluation 06/10/21    Authorization Type Medicare and [employer] - VL based on medical necessity, no auth required    Authorization Time Period initial eval 04/15/21, progress note 05/18/21; Cert 00/3/49-17/91/50    Progress Note Due on Visit 17    PT Start Time 1427    PT Stop Time 1510    PT Time Calculation (min) 43 min    Equipment Utilized During Treatment Gait belt   rollator for negotiating clinic   Activity Tolerance Patient tolerated treatment well;Patient limited by fatigue    Behavior During Therapy WFL for tasks assessed/performed             Past Medical History:  Diagnosis Date   Anemia of chronic renal disease    Aortic stenosis, mild    a.) TTE on 11/23/2020 --> mean gradient 9.7 mmHg   Atrial fibrillation (HCC)    a.) CHA2DS2-VASc = 3 (age, sex, diabetes). b.) daily apixaban   B12 deficiency    Cervical spinal stenosis    Chronic anticoagulation    Apixaban   Diabetic neuropathy (HCC)    Diverticulosis    Dyspnea    ESRD (end stage renal disease) (HCC)    First degree AV block    HLD (hyperlipidemia)    Hypertension    Incomplete right bundle branch block (RBBB)    LAE (left atrial enlargement)    a.) TTE on 11/23/2020 --> moderate   Left thyroid nodule    a.) Cervical MRI on 12/29/2020 --> measured "at least" 3.5 cm; imcompletely imaged.   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   Valvular heart disease    a.) TTE 11/23/2020 --> mild panvalvular regurgitation    Past Surgical History:  Procedure Laterality Date   ANTERIOR CERVICAL DECOMP/DISCECTOMY FUSION N/A 03/24/2021   Procedure: C3-6 ANTERIOR CERVICAL DECOMPRESSION/DISCECTOMY FUSION 3 LEVELS;  Surgeon: Meade Maw, MD;  Location: [facility];  Service: Neurosurgery;  Laterality: N/A;   CATARACT EXTRACTION W/PHACO Left 10/21/2020   Procedure: CATARACT EXTRACTION PHACO AND INTRAOCULAR LENS PLACEMENT ([place]) DIABETES LEFT;  Surgeon: Leandrew Koyanagi, MD;  Location: [place];  Service: Ophthalmology;  Laterality: Left;  2.84 0:48.8 5.8%   EYE SURGERY Right 2020   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   Subjective Assessment - 05/26/21 1248     Subjective Patient reports feeling more fatigued today than usual. She reports no significant paracervical pain at arrival to PT. Patient reports improving consistency with  HEP given by her surgeon. She reports that her blood pressure has remained in normal range.    Pertinent History Patient is a 70 year old female previously seen for dizziness and imbalance, currently s/p C3-6 ACDF (DOS: 03/24/21). No significant complications post-op. She reports some irritation recently along upper periscapular region. Patient is currently donning neck brace until next follow-up with her surgeon. She is refraining from cervical AROM per instructions from physician.  Patient reports using brace the entire day except for completing hygiene tasks, eating,  and sleeping. Patient currently has precautions for lifting > one gallon per report; physician's notes state no lifting > 10 pounds in first 6 weeks. Pt is precautioned from bending and stooping at this time. Patient does have remaining neuropathic symptoms on RUE/RLE. Patient reports no notable sciatic-type symptoms at this time. Patient reports no more urinary urgency/frequency than usual; she does occasionally feel hurried to get to the bathroom. Patient reports no fever, chills, night sweats. Pt has worked on gait with home health; she has walked as far 208 feet. She reports significant difficulty with stair negotiation at this time. Patient reports no stairs to enter/exit home. She has to negotiate 14 steps to get to her bedroom - handrail on L side. Pt has tub shower with shower bench; no grab bars. Pt has to negotiate gravel to get into her home. Pt reports having intermittent slight dizziness, but "not that much" recently.    Limitations Walking;House hold activities;Standing    Diagnostic tests Intraoperative fluoroscopy - no post-op imaging yet    Patient Stated Goals Able to walk with improved steadiness                TREATMENT     Neuromuscular Re-education - for static and dynamic postural stability as needed to improve balance with performance of ADLs and household/community mobility tasks, equilibrium and non-equilibrium coordination to improve motor control with standing activities   In // bars: Hurdle step (single foot step-over blue line with heel to toe) alternating feet; 2x10 alt Forward heel-to-toe walking; 3x D/B Standing toe tap; 2x10 alternating, 6-inch step 3/4 Tandem stance; 2x20sec, bilateral Standing feet together, eyes closed; 3x15sec Sidestepping; 3x D/B length of parallel bars    *not today* Forward and retro stepping; 5x D/B Half Tandem stance; 2x30sec, bilateral Standing Romberg; 3x30sec       Therapeutic Exercise - improved strength as needed to  improve performance of CKC activities/functional movements and as needed for improved power production to prevent fall during episode of large postural perturbation   Ambulate laps around gym; x 2 laps with SPC RUE    In // bars: Standing hip abduction; 2x5 alternating, bilateral   LAQ; 2x10; 5-lb cuff weight, bilateral LE Seated march; 2x10 [heaviness with lifting LLE]      *not today* Ambulate laps around gym; x 1 laps with rollator  In // bars: Standing heel raise/toe raise, alternating; 2x10  Seated hip abduction, black Tband; 2x10          ASSESSMENT Patient reports general feeling of fatigue today. She does have constant dizziness that has been present since previous episode of care in PT. Patient does exhibit improving gait with SPC without cross-over pattern during stepping and minimal loss of gait stability. She has ongoing significant L>R gluteus medius weakness and impaired postural control requiring further PT intervention. Patient has remaining deficits in LE strength, postural stability, gait changes, mild UE coordination deficits, dec cervical spine AROM (s/p fusion and currently in cervical spine  collar throughout the day), and difficulty with transferring and stair/obstacle negotiation.  Patient will benefit from continued skilled therapeutic intervention to address the above deficits as needed for improved function and QoL.       PT Short Term Goals - 05/19/21 1330       PT SHORT TERM GOAL #1   Title Pt will be independent with HEP in order to improve strength and balance in order to decrease fall risk and improve function at home and work.    Baseline 04/15/21: HEP initiated.   05/19/21: Pt is complaint with HEP given in PT, initiating home exercises from surgeon this week    Time 3    Period Days    Status Achieved    Target Date 05/06/21      PT SHORT TERM GOAL #2   Title Patient will demonstrate modified independent gait with SPC for home-mobility distance  (150 ft or greater) in clinic with no LOB and safe management of AD    Baseline 04/15/21: Patient using rollator as primary means of mobility.   05/19/21: Pt still using rollator as primary means of mobility; she has demonstrated gait in parallel bars with no AD with intermittent LOB requiring minA to maintain balance; will practice SPC ambulation next visit    Time 4    Period Weeks    Status Not Met    Target Date 05/13/21               PT Long Term Goals - 05/19/21 1332       PT LONG TERM GOAL #1   Title Pt will improve BERG by at least 3 points in order to demonstrate clinically significant improvement in balance.    Baseline 04/15/21: Baseline BERG to be obtained next visit.   04/19/21: 35/56.  05/18/21: 40/56    Time 8    Period Weeks    Status Achieved    Target Date 05/18/21      PT LONG TERM GOAL #2   Title Pt will decrease 5TSTS by at least 3 seconds in order to demonstrate clinically significant improvement in LE strength.    Baseline 04/15/21: 5TSTS 31 sec.   05/19/21: Improved by 4 seconds    Time 8    Period Weeks    Status Achieved    Target Date 05/18/21      PT LONG TERM GOAL #3   Title Pt will decrease TUG to below 14 seconds/decrease in order to demonstrate decreased fall risk.    Baseline 04/15/21: TUG 25 sec.   05/18/21: TUG 22.6 sec    Time 8    Period Weeks    Status Not Met    Target Date 06/10/21      PT LONG TERM GOAL #4   Title Patient will demonstrate improved function as evidenced by a score of 64 on FOTO measure for full participation in activities at home and in the community.    Baseline 04/15/21: FOTO 53.   05/18/21: FOTO 58    Time 8    Period Weeks    Status Not Met    Target Date 06/10/21                   Plan - 05/26/21 1255     Clinical Impression Statement Patient reports general feeling of fatigue today. She does have constant dizziness that has been present since previous episode of care in PT. Patient does exhibit  improving gait with SPC without  cross-over pattern during stepping and minimal loss of gait stability. She has ongoing significant L>R gluteus medius weakness and impaired postural control requiring further PT intervention. Patient has remaining deficits in LE strength, postural stability, gait changes, mild UE coordination deficits, dec cervical spine AROM (s/p fusion and currently in cervical spine collar throughout the day), and difficulty with transferring and stair/obstacle negotiation.  Patient will benefit from continued skilled therapeutic intervention to address the above deficits as needed for improved function and QoL.    Personal Factors and Comorbidities Age;Comorbidity 3+    Comorbidities chronic kidney disease, Type II DM, peripheral neuropathy, hypertension, Hx of A-fib, cervical spine stenosis s/p C3-6 fusion    Examination-Activity Limitations Bed Mobility;Stairs;Stand;Locomotion Level;Transfers;Bend;Dressing    Examination-Participation Restrictions Community Activity;Cleaning    Stability/Clinical Decision Making Evolving/Moderate complexity    Rehab Potential Good    PT Frequency 2x / week    PT Duration 8 weeks    PT Treatment/Interventions Gait training;Stair training;Functional mobility training;Therapeutic activities;Therapeutic exercise;Balance training;Neuromuscular re-education;Patient/family education;ADLs/Self Care Home Management;Cryotherapy;Electrical Stimulation;Moist Heat    PT Next Visit Plan Continue with gait and balance re-training, LE strengthening. UE coordination work in sitting/standing. Continue strict observance of post-op restrictions. Pt continuing with cervical AROM drills given by surgeon; may use MT for paracervical sensitivity and soft tissue mobility as needed.    PT Home Exercise Plan Access Code: [email or web address]    Consulted and Agree with Plan of Care Patient             Patient will benefit from skilled therapeutic intervention in order to improve  the following deficits and impairments:  Abnormal gait, Postural dysfunction, Difficulty walking, Decreased balance, Decreased coordination, Decreased strength, Decreased activity tolerance  Visit Diagnosis: Difficulty in walking, not elsewhere classified  Imbalance  Muscle weakness (generalized)  S/P cervical spinal fusion     Problem List Patient Active Problem List   Diagnosis Date Noted   Cervical myelopathy ([place]) 03/24/2021   Anemia 12/03/2020   Essential hypertension 12/03/2020   Hypothyroidism 12/03/2020   Type 2 diabetes mellitus ([place]) 12/03/2020   Atrial fibrillation ([place]) 09/21/2018   Kidney transplant status 09/21/2018   Osteoporosis 09/21/2018   Valentina Gu, PT, DPT #[id number]  Eilleen Kempf, PT 05/26/2021, 12:55 PM  [place] [facility] [address] [place], [place], [address] Phone: [phone]   Fax:  [phone]  Name: Jean Johnson MRN: [id number] Date of Birth: 1951/07/07

## 2021-05-26 ENCOUNTER — Encounter: Payer: Self-pay | Admitting: Physical Therapy

## 2021-05-27 ENCOUNTER — Encounter: Payer: 59 | Admitting: Physical Therapy

## 2021-06-01 ENCOUNTER — Encounter: Payer: Self-pay | Admitting: Physical Therapy

## 2021-06-01 ENCOUNTER — Ambulatory Visit: Payer: Medicare Other | Admitting: Physical Therapy

## 2021-06-01 ENCOUNTER — Other Ambulatory Visit: Payer: Self-pay

## 2021-06-01 VITALS — BP 152/55 | HR 64

## 2021-06-01 DIAGNOSIS — M6281 Muscle weakness (generalized): Secondary | ICD-10-CM

## 2021-06-01 DIAGNOSIS — R262 Difficulty in walking, not elsewhere classified: Secondary | ICD-10-CM | POA: Diagnosis not present

## 2021-06-01 DIAGNOSIS — Z981 Arthrodesis status: Secondary | ICD-10-CM

## 2021-06-01 DIAGNOSIS — R2689 Other abnormalities of gait and mobility: Secondary | ICD-10-CM

## 2021-06-01 NOTE — Therapy (Signed)
Jean Johnson [address] [place], [place], [address] Phone: [phone]   Fax:  [phone]  Physical Therapy Treatment  Patient Details  Name: Jean Johnson MRN: [id number] Date of Birth: 1950-09-08 Referring Provider (PT): Jean Maw, MD   Encounter Date: 06/01/2021   PT End of Session - 06/03/21 0728     Visit Number 11    Number of Visits 17    Date for PT Re-Evaluation 06/10/21    Authorization Type Medicare and [employer] - VL based on medical necessity, no auth required    Authorization Time Period initial eval 04/15/21, progress note 05/18/21; Cert 77/4/12-87/86/76    Progress Note Due on Visit 17    PT Start Time 1432    PT Stop Time 1514    PT Time Calculation (min) 42 min    Equipment Utilized During Treatment Gait belt   rollator for negotiating clinic   Activity Tolerance Patient tolerated treatment well;Patient limited by fatigue    Behavior During Therapy [facility] for tasks assessed/performed             Past Medical History:  Diagnosis Date   Anemia of chronic renal disease    Aortic stenosis, mild    a.) TTE on 11/23/2020 --> mean gradient 9.7 mmHg   Atrial fibrillation (HCC)    a.) CHA2DS2-VASc = 3 (age, sex, diabetes). b.) daily apixaban   B12 deficiency    Cervical spinal stenosis    Chronic anticoagulation    Apixaban   Diabetic neuropathy (HCC)    Diverticulosis    Dyspnea    ESRD (end stage renal disease) (HCC)    First degree AV block    HLD (hyperlipidemia)    Hypertension    Incomplete right bundle branch block (RBBB)    LAE (left atrial enlargement)    a.) TTE on 11/23/2020 --> moderate   Left thyroid nodule    a.) Cervical MRI on 12/29/2020 --> measured "at least" 3.5 cm; imcompletely imaged.   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   Valvular heart disease    a.) TTE 11/23/2020 --> mild panvalvular regurgitation    Past Surgical History:  Procedure Laterality Date   ANTERIOR CERVICAL DECOMP/DISCECTOMY FUSION N/A 03/24/2021   Procedure: C3-6 ANTERIOR CERVICAL DECOMPRESSION/DISCECTOMY FUSION 3 LEVELS;  Surgeon: Jean Maw, MD;  Location: [facility];  Service: Neurosurgery;  Laterality: N/A;   CATARACT EXTRACTION W/PHACO Left 10/21/2020   Procedure: CATARACT EXTRACTION PHACO AND INTRAOCULAR LENS PLACEMENT ([place]) DIABETES LEFT;  Surgeon: Jean Koyanagi, MD;  Location: [facility];  Service: Ophthalmology;  Laterality: Left;  2.84 0:48.8 5.8%   EYE SURGERY Right 2020   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   Living donor organ recipient (sister)   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   Cadaveric organ recipient    Vitals:   06/01/21 1449 06/01/21 1451  BP: (!) 151/51 (!) 152/55  Pulse: 63 64  SpO2: 100% 100%     Subjective Assessment - 06/03/21 0728     Subjective Patient reports following up with her optometrist regarding  unusual sensation in her eye (feeling of spinning or rolling). Patient reports mild dizziness at arrival today - she reports some mild feeling of spinning. Patient reports compliance with her HEP. Pt denies significant neck pain or sensation of tightness.    Pertinent History Patient is a 70 year old female previously seen for dizziness and imbalance, currently s/p C3-6 ACDF (DOS: 03/24/21). No significant complications post-op. She reports some irritation recently along upper periscapular region. Patient is currently donning neck brace until next follow-up with her surgeon. She is refraining from cervical AROM  per instructions from physician.  Patient reports using brace the entire day except for completing hygiene tasks, eating, and sleeping. Patient currently has precautions for lifting > one gallon per report; physician's notes state no lifting > 10 pounds in first 6 weeks. Pt is precautioned from bending and stooping at this time. Patient does have remaining neuropathic symptoms on RUE/RLE. Patient reports no notable sciatic-type symptoms at this time. Patient reports no more urinary urgency/frequency than usual; she does occasionally feel hurried to get to the bathroom. Patient reports no fever, chills, night sweats. Pt has worked on gait with home health; she has walked as far 208 feet. She reports significant difficulty with stair negotiation at this time. Patient reports no stairs to enter/exit home. She has to negotiate 14 steps to get to her bedroom - handrail on L side. Pt has tub shower with shower bench; no grab bars. Pt has to negotiate gravel to get into her home. Pt reports having intermittent slight dizziness, but "not that much" recently.    Limitations Walking;House hold activities;Standing    Diagnostic tests Intraoperative fluoroscopy - no post-op imaging yet    Patient Stated Goals Able to walk with improved steadiness               TREATMENT     Neuromuscular Re-education - for static and dynamic postural stability as needed to improve balance with performance of ADLs and household/community mobility tasks, equilibrium and non-equilibrium coordination to improve motor control with standing activities   In // bars: Forward heel-to-toe walking; 3x D/B Standing toe tap; 2x10 alternating, 6-inch step 3/4 Tandem stance; 2x20sec, bilateral Standing feet together, eyes closed; 3x20sec Sidestepping; 2x D/B length of parallel bars with yellow Theraband     *not today* Hurdle step (single foot step-over blue line with heel to toe) alternating feet; 2x10 alt Forward and retro  stepping; 5x D/B Half Tandem stance; 2x30sec, bilateral Standing Romberg; 3x30sec       Therapeutic Exercise - improved strength as needed to improve performance of CKC activities/functional movements and as needed for improved power production to prevent fall during episode of large postural perturbation   Ambulate laps around gym; x 3 laps with SPC RUE     *not today* LAQ; 2x10; 5-lb cuff weight, bilateral LE Seated march; 2x10 [heaviness with lifting LLE] Ambulate laps around gym; x 1 laps with rollator  In // bars: Standing heel raise/toe raise, alternating; 2x10  Seated hip abduction, black Tband; 2x10            ASSESSMENT Patient has continuous sensation of room spinning/room moving that has been ongoing following her cervical fusion. Pt was treated for vertigo during prior episode of care (also treated for imbalance at the time) with symptoms modestly reduced but not resolved. She followed up with optometry to discuss visual changes. She has low diastolic blood pressure and hypertensive systolic blood pressure, but not to level of hypertensive emergency. Discussed follow-up  with PCP regarding her blood pressure management. Pt does exhibit improving gait stability with SPC in clinic with only 1 episode of LOB requiring modA to maintain stability. Patient may require further follow-up for vestibular dysfunction. Patient has remaining deficits in LE strength, postural stability, gait changes, mild UE coordination deficits, dec cervical spine AROM (s/p fusion and currently in cervical spine collar throughout the day), and difficulty with transferring and stair/obstacle negotiation.  Patient will benefit from continued skilled therapeutic intervention to address the above deficits as needed for improved function and QoL.         PT Short Term Goals - 05/19/21 1330       PT SHORT TERM GOAL #1   Title Pt will be independent with HEP in order to improve strength and balance in order  to decrease fall risk and improve function at home and work.    Baseline 04/15/21: HEP initiated.   05/19/21: Pt is complaint with HEP given in PT, initiating home exercises from surgeon this week    Time 3    Period Days    Status Achieved    Target Date 05/06/21      PT SHORT TERM GOAL #2   Title Patient will demonstrate modified independent gait with SPC for home-mobility distance (150 ft or greater) in clinic with no LOB and safe management of AD    Baseline 04/15/21: Patient using rollator as primary means of mobility.   05/19/21: Pt still using rollator as primary means of mobility; she has demonstrated gait in parallel bars with no AD with intermittent LOB requiring minA to maintain balance; will practice SPC ambulation next visit    Time 4    Period Weeks    Status Not Met    Target Date 05/13/21               PT Long Term Goals - 05/19/21 1332       PT LONG TERM GOAL #1   Title Pt will improve BERG by at least 3 points in order to demonstrate clinically significant improvement in balance.    Baseline 04/15/21: Baseline BERG to be obtained next visit.   04/19/21: 35/56.  05/18/21: 40/56    Time 8    Period Weeks    Status Achieved    Target Date 05/18/21      PT LONG TERM GOAL #2   Title Pt will decrease 5TSTS by at least 3 seconds in order to demonstrate clinically significant improvement in LE strength.    Baseline 04/15/21: 5TSTS 31 sec.   05/19/21: Improved by 4 seconds    Time 8    Period Weeks    Status Achieved    Target Date 05/18/21      PT LONG TERM GOAL #3   Title Pt will decrease TUG to below 14 seconds/decrease in order to demonstrate decreased fall risk.    Baseline 04/15/21: TUG 25 sec.   05/18/21: TUG 22.6 sec    Time 8    Period Weeks    Status Not Met    Target Date 06/10/21      PT LONG TERM GOAL #4   Title Patient will demonstrate improved function as evidenced by a score of 64 on FOTO measure for full participation in activities at home and in the  community.    Baseline 04/15/21: FOTO 53.   05/18/21: FOTO 58    Time 8    Period Weeks    Status Not Met  Target Date 06/10/21                   Plan - 06/03/21 0735     Clinical Impression Statement Patient has continuous sensation of room spinning/room moving that has been ongoing following her cervical fusion. Pt was treated for vertigo during prior episode of care (also treated for imbalance at the time) with symptoms modestly reduced but not resolved. She followed up with optometry to discuss visual changes. She has low diastolic blood pressure and hypertensive systolic blood pressure, but not to level of hypertensive emergency. Discussed follow-up with PCP regarding her blood pressure management. Pt does exhibit improving gait stability with SPC in clinic with only 1 episode of LOB requiring modA to maintain stability. Patient may require further follow-up for vestibular dysfunction. Patient has remaining deficits in LE strength, postural stability, gait changes, mild UE coordination deficits, dec cervical spine AROM (s/p fusion and currently in cervical spine collar throughout the day), and difficulty with transferring and stair/obstacle negotiation.  Patient will benefit from continued skilled therapeutic intervention to address the above deficits as needed for improved function and QoL.    Personal Factors and Comorbidities Age;Comorbidity 3+    Comorbidities chronic kidney disease, Type II DM, peripheral neuropathy, hypertension, Hx of A-fib, cervical spine stenosis s/p C3-6 fusion    Examination-Activity Limitations Bed Mobility;Stairs;Stand;Locomotion Level;Transfers;Bend;Dressing    Examination-Participation Restrictions Community Activity;Cleaning    Stability/Clinical Decision Making Evolving/Moderate complexity    Rehab Potential Good    PT Frequency 2x / week    PT Duration 8 weeks    PT Treatment/Interventions Gait training;Stair training;Functional mobility  training;Therapeutic activities;Therapeutic exercise;Balance training;Neuromuscular re-education;Patient/family education;ADLs/Self Care Home Management;Cryotherapy;Electrical Stimulation;Moist Heat    PT Next Visit Plan Continue with gait and balance re-training, LE strengthening. UE coordination work in sitting/standing. Continue strict observance of post-op restrictions. Pt continuing with cervical AROM drills given by surgeon; may use MT for paracervical sensitivity and soft tissue mobility as needed.    PT Home Exercise Plan Access Code: [email or web address]    Consulted and Agree with Plan of Care Patient             Patient will benefit from skilled therapeutic intervention in order to improve the following deficits and impairments:  Abnormal gait, Postural dysfunction, Difficulty walking, Decreased balance, Decreased coordination, Decreased strength, Decreased activity tolerance  Visit Diagnosis: Difficulty in walking, not elsewhere classified  Imbalance  Muscle weakness (generalized)  S/P cervical spinal fusion     Problem List Patient Active Problem List   Diagnosis Date Noted   Cervical myelopathy ([place]) 03/24/2021   Anemia 12/03/2020   Essential hypertension 12/03/2020   Hypothyroidism 12/03/2020   Type 2 diabetes mellitus ([place]) 12/03/2020   Atrial fibrillation ([place]) 09/21/2018   Kidney transplant status 09/21/2018   Osteoporosis 09/21/2018   Valentina Gu, PT, DPT #[id number]  Eilleen Kempf, PT 06/03/2021, 7:35 AM  [facility] [address] [place], [place], [address] Phone: [phone]   Fax:  [phone]  Name: Tanji Storrs MRN: [id number] Date of Birth: February 08, 1951

## 2021-06-08 ENCOUNTER — Encounter: Payer: Self-pay | Admitting: Physical Therapy

## 2021-06-08 ENCOUNTER — Other Ambulatory Visit: Payer: Self-pay

## 2021-06-08 ENCOUNTER — Ambulatory Visit: Payer: Medicare Other | Admitting: Physical Therapy

## 2021-06-08 VITALS — BP 173/55 | HR 66

## 2021-06-08 DIAGNOSIS — Z981 Arthrodesis status: Secondary | ICD-10-CM

## 2021-06-08 DIAGNOSIS — R2689 Other abnormalities of gait and mobility: Secondary | ICD-10-CM

## 2021-06-08 DIAGNOSIS — R262 Difficulty in walking, not elsewhere classified: Secondary | ICD-10-CM | POA: Diagnosis not present

## 2021-06-08 DIAGNOSIS — M6281 Muscle weakness (generalized): Secondary | ICD-10-CM

## 2021-06-08 NOTE — Therapy (Signed)
[facility] [address] [place], [place], [address] Phone: [phone]   Fax:  [phone]  Physical Therapy Treatment  Patient Details  Name: Jean Johnson MRN: [id number] Date of Birth: 03/15/1951 Referring Provider (PT): Meade Maw, MD   Encounter Date: 06/08/2021   PT End of Session - 06/08/21 1441     Visit Number 12    Number of Visits 17    Date for PT Re-Evaluation 06/10/21    Authorization Type Medicare and [employer] - VL based on medical necessity, no auth required    Authorization Time Period initial eval 04/15/21, progress note 05/18/21; Cert 03/17/27-36/62/94    Progress Note Due on Visit 17    PT Start Time 1435    PT Stop Time 1517    PT Time Calculation (min) 42 min    Equipment Utilized During Treatment Gait belt   rollator for negotiating clinic   Activity Tolerance Patient tolerated treatment well;Patient limited by fatigue    Behavior During Therapy WFL for tasks assessed/performed             Past Medical History:  Diagnosis Date   Anemia of chronic renal disease    Aortic stenosis, mild    a.) TTE on 11/23/2020 --> mean gradient 9.7 mmHg   Atrial fibrillation (HCC)    a.) CHA2DS2-VASc = 3 (age, sex, diabetes). b.) daily apixaban   B12 deficiency    Cervical spinal stenosis    Chronic anticoagulation    Apixaban   Diabetic neuropathy (HCC)    Diverticulosis    Dyspnea    ESRD (end stage renal disease) (HCC)    First degree AV block    HLD (hyperlipidemia)    Hypertension    Incomplete right bundle branch block (RBBB)    LAE (left atrial enlargement)    a.) TTE on 11/23/2020 --> moderate   Left thyroid nodule    a.) Cervical MRI on 12/29/2020 --> measured "at least" 3.5 cm; imcompletely imaged.   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   Valvular heart disease    a.) TTE 11/23/2020 --> mild panvalvular regurgitation    Past Surgical History:  Procedure Laterality Date   ANTERIOR CERVICAL DECOMP/DISCECTOMY FUSION N/A 03/24/2021   Procedure: C3-6 ANTERIOR CERVICAL DECOMPRESSION/DISCECTOMY FUSION 3 LEVELS;  Surgeon: Meade Maw, MD;  Location: [facility];  Service: Neurosurgery;  Laterality: N/A;   CATARACT EXTRACTION W/PHACO Left 10/21/2020   Procedure: CATARACT EXTRACTION PHACO AND INTRAOCULAR LENS PLACEMENT ([place]) DIABETES LEFT;  Surgeon: Leandrew Koyanagi, MD;  Location: [facility];  Service: Ophthalmology;  Laterality: Left;  2.84 0:48.8 5.8%   EYE SURGERY Right 2020   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   Living donor organ recipient (sister)   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   Cadaveric organ recipient    Vitals:   06/08/21 1450  BP: (!) 173/55  Pulse: 66  SpO2: 100%     Subjective Assessment - 06/08/21 1437     Subjective Patient reports minimal neck pain presently. She reports ongoing dizziness - she reports feeling  that she was listing from one side to the other when walking at home. Patient reports her strength is gradually returning. She has not yet discussed current BP management with physician. She has upcoming appointment with cardiology.  Pt states she would lke to discuss her dizziness with neurology, but she does not currently have appointment scheduled for this year.    Pertinent History Patient is a 70 year old female previously seen for dizziness and imbalance, currently s/p C3-6 ACDF (DOS: 03/24/21). No significant complications post-op. She reports some irritation recently along upper periscapular region.  Patient is currently donning neck brace until next follow-up with her surgeon. She is refraining from cervical AROM per instructions from physician.  Patient reports using brace the entire day except for completing hygiene tasks, eating, and sleeping. Patient currently has precautions for lifting > one gallon per report; physician's notes state no lifting > 10 pounds in first 6 weeks. Pt is precautioned from bending and stooping at this time. Patient does have remaining neuropathic symptoms on RUE/RLE. Patient reports no notable sciatic-type symptoms at this time. Patient reports no more urinary urgency/frequency than usual; she does occasionally feel hurried to get to the bathroom. Patient reports no fever, chills, night sweats. Pt has worked on gait with home health; she has walked as far 208 feet. She reports significant difficulty with stair negotiation at this time. Patient reports no stairs to enter/exit home. She has to negotiate 14 steps to get to her bedroom - handrail on L side. Pt has tub shower with shower bench; no grab bars. Pt has to negotiate gravel to get into her home. Pt reports having intermittent slight dizziness, but "not that much" recently.    Limitations Walking;House hold activities;Standing    Diagnostic tests Intraoperative fluoroscopy - no post-op imaging yet    Patient Stated Goals Able to walk with improved steadiness                 EXAMINATION  POSTURE: Forward head, rounded shoulders posture, rests in posterior pelvic tilt   NEUROLOGICAL SCREEN: N=normal  Ab=abnormal  Level Dermatome R L  C3 Anterior Neck N N  C4 Top of Shoulder N N  C5 Lateral Upper Arm N N  C6 Lateral Arm/ Thumb N N  C7 Middle Finger N N  C8 4th & 5th Finger N N  T1 Medial Arm N N  L2 Medial thigh/groin N N  L3 Lower thigh/med.knee N N  L4 Medial leg/lat thigh N N  L5 Lat. leg & dorsal foot N N  S1 post/lat foot/thigh/leg N N  S2 Post./med. thigh & leg N N    Cranial  Nerves Impaired visual acuity L eye with Hx of diabetes (unspecified if retinopathy) Extraocular muscles are intact  Facial sensation is intact bilaterally  Facial strength is intact bilaterally  Hearing is normal as tested by gross conversation  Shoulder shrug strength is intact     COORDINATION: Finger to Nose: Normal Heel to Shin: Normal Pronator Drift: Negative Rapid Alternating Movements: Normal Finger to Thumb Opposition: Normal    OCULOMOTOR / VESTIBULAR TESTING:  Oculomotor Exam- Room Light  Findings Comments  Ocular Alignment abnormal   Ocular ROM normal   Spontaneous Nystagmus normal   End-Gaze Nystagmus normal   Vergence (normal 2-3") normal   Smooth Pursuit abnormal Difficulty with upward and downward tracking with corrective saccades  Cross-Cover Test not examined   Saccades abnormal Difficulty maintaining cadence, saccadic corrections with L lateral deviation, reproduction of symptoms with L  saccades  VOR Cancellation abnormal Difficulty maintaining focus on target with eyes deviation ipsilaterally to side in which target/head is moving with en bloc rotation   VOR horizontal WNL VOR vertical WNL       TREATMENT     Neuromuscular Re-education - for static and dynamic postural stability as needed to improve balance with performance of ADLs and household/community mobility tasks, equilibrium and non-equilibrium coordination to improve motor control with standing activities   -Vestibular screening, see above  In // bars: Forward heel-to-toe walking; 3x D/B Standing feet together, eyes closed; 1x10sec, 2x15 sec  Seated saccades, horizontal and vertical; 2x10 each [Mild dizziness with horizontal saccades]   *next visit* Standing toe tap; 2x10 alternating, 6-inch step 3/4 Tandem stance; 2x20sec, bilateral  Sidestepping; 2x D/B length of parallel bars with yellow Theraband     *not today* Hurdle step (single foot step-over blue line with heel to toe)  alternating feet; 2x10 alt Forward and retro stepping; 5x D/B Half Tandem stance; 2x30sec, bilateral Standing Romberg; 3x30sec       Therapeutic Exercise - improved strength as needed to improve performance of CKC activities/functional movements and as needed for improved power production to prevent fall during episode of large postural perturbation   Ambulate laps around gym; x 3 laps with SPC RUE     *not today* LAQ; 2x10; 5-lb cuff weight, bilateral LE Seated march; 2x10 [heaviness with lifting LLE] Ambulate laps around gym; x 1 laps with rollator  In // bars: Standing heel raise/toe raise, alternating; 2x10  Seated hip abduction, black Tband; 2x10            ASSESSMENT Patient has continuous sensation of spinning that was not significantly reproduced with previous canalith re-positioning techniques (prior to cervical fusion). PT has been focused on post-operative rehab following cervical myelopathy and ACDF. Patient has ongoing significant weakness, gait deficits, and imbalance requiring further intervention. Symptoms do not seem to be stemming from peripheral vestibular dysfunction given history. Patient will be following up with cardiologist regarding dizziness and low diastolic blood pressure. She may require further screening and diastolic workup for persistent central vertigo. Patient has remaining deficits in LE strength, postural stability, gait changes, mild UE coordination deficits, dec cervical spine AROM (s/p fusion and currently in cervical spine collar throughout the day), and difficulty with transferring and stair/obstacle negotiation.  Patient will benefit from continued skilled therapeutic intervention to address the above deficits as needed for improved function and QoL.     PT Short Term Goals - 05/19/21 1330       PT SHORT TERM GOAL #1   Title Pt will be independent with HEP in order to improve strength and balance in order to decrease fall risk and improve  function at home and work.    Baseline 04/15/21: HEP initiated.   05/19/21: Pt is complaint with HEP given in PT, initiating home exercises from surgeon this week    Time 3    Period Days    Status Achieved    Target Date 05/06/21      PT SHORT TERM GOAL #2   Title Patient will demonstrate modified independent gait with SPC for home-mobility distance (150 ft or greater) in clinic with no LOB and safe management of AD    Baseline 04/15/21: Patient using rollator as primary means of mobility.   05/19/21: Pt still using rollator as primary means of mobility; she has demonstrated gait in parallel bars with no AD with intermittent LOB requiring minA to  maintain balance; will practice SPC ambulation next visit    Time 4    Period Weeks    Status Not Met    Target Date 05/13/21               PT Long Term Goals - 05/19/21 1332       PT LONG TERM GOAL #1   Title Pt will improve BERG by at least 3 points in order to demonstrate clinically significant improvement in balance.    Baseline 04/15/21: Baseline BERG to be obtained next visit.   04/19/21: 35/56.  05/18/21: 40/56    Time 8    Period Weeks    Status Achieved    Target Date 05/18/21      PT LONG TERM GOAL #2   Title Pt will decrease 5TSTS by at least 3 seconds in order to demonstrate clinically significant improvement in LE strength.    Baseline 04/15/21: 5TSTS 31 sec.   05/19/21: Improved by 4 seconds    Time 8    Period Weeks    Status Achieved    Target Date 05/18/21      PT LONG TERM GOAL #3   Title Pt will decrease TUG to below 14 seconds/decrease in order to demonstrate decreased fall risk.    Baseline 04/15/21: TUG 25 sec.   05/18/21: TUG 22.6 sec    Time 8    Period Weeks    Status Not Met    Target Date 06/10/21      PT LONG TERM GOAL #4   Title Patient will demonstrate improved function as evidenced by a score of 64 on FOTO measure for full participation in activities at home and in the community.    Baseline 04/15/21:  FOTO 53.   05/18/21: FOTO 58    Time 8    Period Weeks    Status Not Met    Target Date 06/10/21                   Plan - 06/09/21 1617     Clinical Impression Statement Patient has continuous sensation of spinning that was not significantly reproduced with previous canalith re-positioning techniques (prior to cervical fusion). PT has been focused on post-operative rehab following cervical myelopathy and ACDF. Patient has ongoing significant weakness, gait deficits, and imbalance requiring further intervention. Symptoms do not seem to be stemming from peripheral vestibular dysfunction given history. Patient will be following up with cardiologist regarding dizziness and low diastolic blood pressure. She may require further screening and diastolic workup for persistent central vertigo. Patient has remaining deficits in LE strength, postural stability, gait changes, mild UE coordination deficits, dec cervical spine AROM (s/p fusion and currently in cervical spine collar throughout the day), and difficulty with transferring and stair/obstacle negotiation.  Patient will benefit from continued skilled therapeutic intervention to address the above deficits as needed for improved function and QoL.    Personal Factors and Comorbidities Age;Comorbidity 3+    Comorbidities chronic kidney disease, Type II DM, peripheral neuropathy, hypertension, Hx of A-fib, cervical spine stenosis s/p C3-6 fusion    Examination-Activity Limitations Bed Mobility;Stairs;Stand;Locomotion Level;Transfers;Bend;Dressing    Examination-Participation Restrictions Community Activity;Cleaning    Stability/Clinical Decision Making Evolving/Moderate complexity    Rehab Potential Good    PT Frequency 2x / week    PT Duration 8 weeks    PT Treatment/Interventions Gait training;Stair training;Functional mobility training;Therapeutic activities;Therapeutic exercise;Balance training;Neuromuscular re-education;Patient/family  education;ADLs/Self Care Home Management;Cryotherapy;Electrical Stimulation;Moist Heat    PT Next  Visit Plan Continue with gait and balance re-training, LE strengthening. UE coordination work in sitting/standing. Continue strict observance of post-op restrictions. Pt continuing with cervical AROM drills given by surgeon; may use MT for paracervical sensitivity and soft tissue mobility as needed.    PT Home Exercise Plan Access Code: [email or web address]    Consulted and Agree with Plan of Care Patient             Patient will benefit from skilled therapeutic intervention in order to improve the following deficits and impairments:  Abnormal gait, Postural dysfunction, Difficulty walking, Decreased balance, Decreased coordination, Decreased strength, Decreased activity tolerance  Visit Diagnosis: Difficulty in walking, not elsewhere classified  Imbalance  Muscle weakness (generalized)  S/P cervical spinal fusion     Problem List Patient Active Problem List   Diagnosis Date Noted   Cervical myelopathy ([place]) 03/24/2021   Anemia 12/03/2020   Essential hypertension 12/03/2020   Hypothyroidism 12/03/2020   Type 2 diabetes mellitus ([place]) 12/03/2020   Atrial fibrillation ([place]) 09/21/2018   Kidney transplant status 09/21/2018   Osteoporosis 09/21/2018   Valentina Gu, PT, DPT #[id number]  Eilleen Kempf, PT 06/09/2021, 4:20 PM  [facility] [address] [place], [place], [address] Phone: [phone]   Fax:  [phone]  Name: Mackenze Grandison MRN: [id number] Date of Birth: 11-25-1950

## 2021-06-10 ENCOUNTER — Other Ambulatory Visit: Payer: Self-pay

## 2021-06-10 ENCOUNTER — Encounter: Payer: Self-pay | Admitting: Physical Therapy

## 2021-06-10 ENCOUNTER — Ambulatory Visit: Payer: Medicare Other | Attending: Neurosurgery | Admitting: Physical Therapy

## 2021-06-10 VITALS — BP 167/56 | HR 64

## 2021-06-10 DIAGNOSIS — R2689 Other abnormalities of gait and mobility: Secondary | ICD-10-CM | POA: Insufficient documentation

## 2021-06-10 DIAGNOSIS — R262 Difficulty in walking, not elsewhere classified: Secondary | ICD-10-CM | POA: Diagnosis present

## 2021-06-10 DIAGNOSIS — Z981 Arthrodesis status: Secondary | ICD-10-CM | POA: Diagnosis present

## 2021-06-10 DIAGNOSIS — M6281 Muscle weakness (generalized): Secondary | ICD-10-CM | POA: Diagnosis present

## 2021-06-10 NOTE — Therapy (Signed)
[place] [facility] [address] [place], [place], [address] Phone: [phone]   Fax:  [phone]  Physical Therapy Treatment  Patient Details  Name: Jean Johnson MRN: [id number] Date of Birth: [birth year] Referring Provider (PT): Meade Maw, MD   Encounter Date: 06/10/2021   PT End of Session - 06/10/21 1445     Visit Number 13    Number of Visits 17    Date for PT Re-Evaluation 06/10/21    Authorization Type Medicare and [employer] - VL based on medical necessity, no auth required    Authorization Time Period initial eval 04/15/21, progress note 05/18/21; Cert 85/0/27-74/12/87    Progress Note Due on Visit 17    PT Start Time 1436    PT Stop Time 1520    PT Time Calculation (min) 44 min    Equipment Utilized During Treatment Gait belt   rollator for negotiating clinic   Activity Tolerance Patient tolerated treatment well;Patient limited by fatigue    Behavior During Therapy [facility] for tasks assessed/performed             Past Medical History:  Diagnosis Date   Anemia of chronic renal disease    Aortic stenosis, mild    a.) TTE on 11/23/2020 --> mean gradient 9.7 mmHg   Atrial fibrillation (HCC)    a.) CHA2DS2-VASc = 3 (age, sex, diabetes). b.) daily apixaban   B12 deficiency    Cervical spinal stenosis    Chronic anticoagulation    Apixaban   Diabetic neuropathy (HCC)    Diverticulosis    Dyspnea    ESRD (end stage renal disease) (HCC)    First degree AV block    HLD (hyperlipidemia)    Hypertension    Incomplete right bundle branch block (RBBB)    LAE (left atrial enlargement)    a.) TTE on 11/23/2020 --> moderate   Left thyroid nodule    a.) Cervical MRI on 12/29/2020 --> measured "at least" 3.5 cm; imcompletely imaged.   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   Valvular heart disease    a.) TTE 11/23/2020 --> mild panvalvular regurgitation    Past Surgical History:  Procedure Laterality Date   ANTERIOR CERVICAL DECOMP/DISCECTOMY FUSION N/A 03/24/2021   Procedure: C3-6 ANTERIOR CERVICAL DECOMPRESSION/DISCECTOMY FUSION 3 LEVELS;  Surgeon: Meade Maw, MD;  Location: [facility];  Service: Neurosurgery;  Laterality: N/A;   CATARACT EXTRACTION W/PHACO Left 10/21/2020   Procedure: CATARACT EXTRACTION PHACO AND INTRAOCULAR LENS PLACEMENT ([place]) DIABETES LEFT;  Surgeon: Leandrew Koyanagi, MD;  Location: [facility];  Service: Ophthalmology;  Laterality: Left;  2.84 0:48.8 5.8%   EYE SURGERY Right 2020   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   Living donor organ recipient (sister)   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   Cadaveric organ recipient    Vitals:   06/10/21 1446  BP: (!) 167/56  Pulse: 64  SpO2: 100%     Subjective Assessment - 06/10/21 1439     Subjective Patient reports feeling "okay" at arrival to PT. Patient reports using her walker for  primary means of mobility and she uses her quad cane for negotiating steps. Patient reports compliance with her home exercise program. Patient reports baseline dizziness/spinning at arrival to PT today.    Pertinent History Patient is a 70 year old female previously seen for dizziness and imbalance, currently s/p C3-6 ACDF (DOS: 03/24/21). No significant complications post-op. She reports some irritation recently along upper periscapular region. Patient is currently donning neck brace until next follow-up with her surgeon. She is refraining from cervical AROM per instructions from physician.  Patient reports using  brace the entire day except for completing hygiene tasks, eating, and sleeping. Patient currently has precautions for lifting > one gallon per report; physician's notes state no lifting > 10 pounds in first 6 weeks. Pt is precautioned from bending and stooping at this time. Patient does have remaining neuropathic symptoms on RUE/RLE. Patient reports no notable sciatic-type symptoms at this time. Patient reports no more urinary urgency/frequency than usual; she does occasionally feel hurried to get to the bathroom. Patient reports no fever, chills, night sweats. Pt has worked on gait with home health; she has walked as far 208 feet. She reports significant difficulty with stair negotiation at this time. Patient reports no stairs to enter/exit home. She has to negotiate 14 steps to get to her bedroom - handrail on L side. Pt has tub shower with shower bench; no grab bars. Pt has to negotiate gravel to get into her home. Pt reports having intermittent slight dizziness, but "not that much" recently.    Limitations Walking;House hold activities;Standing    Diagnostic tests Intraoperative fluoroscopy - no post-op imaging yet    Patient Stated Goals Able to walk with improved steadiness                   TREATMENT     Neuromuscular Re-education - for static and dynamic postural stability as needed to improve balance with performance of ADLs and household/community mobility tasks, equilibrium and non-equilibrium coordination to improve motor control with standing activities     In // bars: Forward heel-to-toe walking; 3x D/Bx Standing feet together, eyes closed; difficulty maintaining > 5 sec Standing feet apart, eyes closed; 2x30 sec, bilat   Seated saccades, horizontal and vertical; 2x20 each  Standing toe tap; 2x10 alternating, 6-inch step  3/4 Tandem stance; 2x20sec, bilateral   Sidestepping; 2x D/B length of parallel bars with yellow Theraband  Standing on Airex with feet together;  2x30sec    *not today* Hurdle step (single foot step-over blue line with heel to toe) alternating feet; 2x10 alt Forward and retro stepping; 5x D/B Half Tandem stance; 2x30sec, bilateral Standing Romberg; 3x30sec       Therapeutic Exercise - improved strength as needed to improve performance of CKC activities/functional movements and as needed for improved power production to prevent fall during episode of large postural perturbation   Ambulate laps around gym; x 3 laps with SPC RUE  Pt edu: Recommended against use of quad cane, ongoing use of rollator for most of her mobility with goal for progression to use of SPC primarily as her condition improves     *not today* LAQ; 2x10; 5-lb cuff weight, bilateral LE Seated march; 2x10 [heaviness with lifting LLE] Ambulate laps around gym; x 1 laps with rollator  In // bars: Standing heel raise/toe raise, alternating; 2x10  Seated hip abduction, black Tband; 2x10            ASSESSMENT Patient has mild constant sensation of spinning (pt verbalizes feeling  of "eyes spinning") with no significant exacerbation with saccades today versus mild exacerbation last visit with saccades. She does have low diastolic blood pressure; discussed with patient importance of following up with her cardiologist regarding current BP management. She demonstrates ongoing gait instability, but she is improving with ability to negotiate gym with single-point cane without significant LOB. She is still significantly challenged with 3/4 Tandem exercises and standing Romberg with eyes closed. Patient has remaining deficits in LE strength, postural stability, gait changes, mild UE coordination deficits, dec cervical spine AROM (s/p fusion and currently in cervical spine collar throughout the day), and difficulty with transferring and stair/obstacle negotiation.  Patient will benefit from continued skilled therapeutic intervention to address the above deficits as needed for  improved function and QoL.        PT Short Term Goals - 05/19/21 1330       PT SHORT TERM GOAL #1   Title Pt will be independent with HEP in order to improve strength and balance in order to decrease fall risk and improve function at home and work.    Baseline 04/15/21: HEP initiated.   05/19/21: Pt is complaint with HEP given in PT, initiating home exercises from surgeon this week    Time 3    Period Days    Status Achieved    Target Date 05/06/21      PT SHORT TERM GOAL #2   Title Patient will demonstrate modified independent gait with SPC for home-mobility distance (150 ft or greater) in clinic with no LOB and safe management of AD    Baseline 04/15/21: Patient using rollator as primary means of mobility.   05/19/21: Pt still using rollator as primary means of mobility; she has demonstrated gait in parallel bars with no AD with intermittent LOB requiring minA to maintain balance; will practice SPC ambulation next visit    Time 4    Period Weeks    Status Not Met    Target Date 05/13/21               PT Long Term Goals - 05/19/21 1332       PT LONG TERM GOAL #1   Title Pt will improve BERG by at least 3 points in order to demonstrate clinically significant improvement in balance.    Baseline 04/15/21: Baseline BERG to be obtained next visit.   04/19/21: 35/56.  05/18/21: 40/56    Time 8    Period Weeks    Status Achieved    Target Date 05/18/21      PT LONG TERM GOAL #2   Title Pt will decrease 5TSTS by at least 3 seconds in order to demonstrate clinically significant improvement in LE strength.    Baseline 04/15/21: 5TSTS 31 sec.   05/19/21: Improved by 4 seconds    Time 8    Period Weeks    Status Achieved    Target Date 05/18/21      PT LONG TERM GOAL #3   Title Pt will decrease TUG to below 14 seconds/decrease in order to demonstrate decreased fall risk.    Baseline 04/15/21: TUG 25 sec.   05/18/21: TUG 22.6 sec    Time 8    Period Weeks    Status Not Met    Target  Date 06/10/21      PT LONG TERM GOAL #4   Title Patient will demonstrate improved function as evidenced by a score of 64 on FOTO measure for full participation in activities  at home and in the community.    Baseline 04/15/21: FOTO 53.   05/18/21: FOTO 58    Time 8    Period Weeks    Status Not Met    Target Date 06/10/21                   Plan - 06/11/21 1113     Clinical Impression Statement Patient has mild constant sensation of spinning (pt verbalizes feeling of "eyes spinning") with no significant exacerbation with saccades today versus mild exacerbation last visit with saccades. She does have low diastolic blood pressure; discussed with patient importance of following up with her cardiologist regarding current BP management. She demonstrates ongoing gait instability, but she is improving with ability to negotiate gym with single-point cane without significant LOB. She is still significantly challenged with 3/4 Tandem exercises and standing Romberg with eyes closed. Patient has remaining deficits in LE strength, postural stability, gait changes, mild UE coordination deficits, dec cervical spine AROM (s/p fusion and currently in cervical spine collar throughout the day), and difficulty with transferring and stair/obstacle negotiation.  Patient will benefit from continued skilled therapeutic intervention to address the above deficits as needed for improved function and QoL.    Personal Factors and Comorbidities Age;Comorbidity 3+    Comorbidities chronic kidney disease, Type II DM, peripheral neuropathy, hypertension, Hx of A-fib, cervical spine stenosis s/p C3-6 fusion    Examination-Activity Limitations Bed Mobility;Stairs;Stand;Locomotion Level;Transfers;Bend;Dressing    Examination-Participation Restrictions Community Activity;Cleaning    Stability/Clinical Decision Making Evolving/Moderate complexity    Rehab Potential Good    PT Frequency 2x / week    PT Duration 8 weeks    PT  Treatment/Interventions Gait training;Stair training;Functional mobility training;Therapeutic activities;Therapeutic exercise;Balance training;Neuromuscular re-education;Patient/family education;ADLs/Self Care Home Management;Cryotherapy;Electrical Stimulation;Moist Heat    PT Next Visit Plan Continue with gait and balance re-training, LE strengthening. UE coordination work in sitting/standing. Continue strict observance of post-op restrictions. Pt continuing with cervical AROM drills given by surgeon; may use MT for paracervical sensitivity and soft tissue mobility as needed.    PT Home Exercise Plan Access Code: [email or web address]    Consulted and Agree with Plan of Care Patient             Patient will benefit from skilled therapeutic intervention in order to improve the following deficits and impairments:  Abnormal gait, Postural dysfunction, Difficulty walking, Decreased balance, Decreased coordination, Decreased strength, Decreased activity tolerance  Visit Diagnosis: Difficulty in walking, not elsewhere classified  Imbalance  Muscle weakness (generalized)  S/P cervical spinal fusion     Problem List Patient Active Problem List   Diagnosis Date Noted   Cervical myelopathy ([place]) 03/24/2021   Anemia 12/03/2020   Essential hypertension 12/03/2020   Hypothyroidism 12/03/2020   Type 2 diabetes mellitus ([place]) 12/03/2020   Atrial fibrillation ([place]) 09/21/2018   Kidney transplant status 09/21/2018   Osteoporosis 09/21/2018   Valentina Gu, PT, DPT #[id number]  Eilleen Kempf, PT 06/11/2021, 11:14 AM  [place] [facility] [address] [place], [place], [address] Phone: [phone]   Fax:  [phone]  Name: Jean Johnson MRN: [id number] Date of Birth: 1950-08-27

## 2021-06-15 ENCOUNTER — Ambulatory Visit: Payer: Medicare Other | Admitting: Physical Therapy

## 2021-06-15 ENCOUNTER — Other Ambulatory Visit: Payer: Self-pay

## 2021-06-15 ENCOUNTER — Encounter: Payer: Self-pay | Admitting: Physical Therapy

## 2021-06-15 DIAGNOSIS — R2689 Other abnormalities of gait and mobility: Secondary | ICD-10-CM

## 2021-06-15 DIAGNOSIS — M6281 Muscle weakness (generalized): Secondary | ICD-10-CM

## 2021-06-15 DIAGNOSIS — R262 Difficulty in walking, not elsewhere classified: Secondary | ICD-10-CM

## 2021-06-15 DIAGNOSIS — Z981 Arthrodesis status: Secondary | ICD-10-CM

## 2021-06-15 NOTE — Therapy (Signed)
[facility] [address]. [place], [place], [address] Phone: [phone]   Fax:  [phone]  Physical Therapy Treatment  Patient Details  Name: Jean Johnson MRN: [id number] Date of Birth: 1951/02/19 Referring Provider (PT): Meade Maw, MD   Encounter Date: 06/15/2021   PT End of Session - 06/17/21 0837     Visit Number 14    Number of Visits 17    Date for PT Re-Evaluation 06/10/21    Authorization Type Medicare and [employer] - VL based on medical necessity, no auth required    Authorization Time Period initial eval 04/15/21, progress note 05/18/21; Cert 82/5/00-37/04/88    Progress Note Due on Visit 17    PT Start Time 1436    PT Stop Time 1520    PT Time Calculation (min) 44 min    Equipment Utilized During Treatment Gait belt   rollator for negotiating clinic   Activity Tolerance Patient tolerated treatment well;Patient limited by fatigue    Behavior During Therapy [facility] for tasks assessed/performed             Past Medical History:  Diagnosis Date   Anemia of chronic renal disease    Aortic stenosis, mild    a.) TTE on 11/23/2020 --> mean gradient 9.7 mmHg   Atrial fibrillation (HCC)    a.) CHA2DS2-VASc = 3 (age, sex, diabetes). b.) daily apixaban   B12 deficiency    Cervical spinal stenosis    Chronic anticoagulation    Apixaban   Diabetic neuropathy (HCC)    Diverticulosis    Dyspnea    ESRD (end stage renal disease) (HCC)    First degree AV block    HLD (hyperlipidemia)    Hypertension    Incomplete right bundle branch block (RBBB)    LAE (left atrial enlargement)    a.) TTE on 11/23/2020 --> moderate   Left thyroid nodule    a.) Cervical MRI on 12/29/2020 --> measured "at least" 3.5 cm; imcompletely imaged.   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   Valvular heart disease    a.) TTE 11/23/2020 --> mild panvalvular regurgitation    Past Surgical History:  Procedure Laterality Date   ANTERIOR CERVICAL DECOMP/DISCECTOMY FUSION N/A 03/24/2021   Procedure: C3-6 ANTERIOR CERVICAL DECOMPRESSION/DISCECTOMY FUSION 3 LEVELS;  Surgeon: Meade Maw, MD;  Location: [facility];  Service: Neurosurgery;  Laterality: N/A;   CATARACT EXTRACTION W/PHACO Left 10/21/2020   Procedure: CATARACT EXTRACTION PHACO AND INTRAOCULAR LENS PLACEMENT ([place]) DIABETES LEFT;  Surgeon: Leandrew Koyanagi, MD;  Location: [place];  Service: Ophthalmology;  Laterality: Left;  2.84 0:48.8 5.8%   EYE SURGERY Right 2020   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   Subjective Assessment - 06/17/21 0839     Subjective Patient reports minimal/negligible dizziness at arrival to PT. She reports no other major complaints at arrival. Pt denies recent falls or new safety  concerns.    Pertinent History Patient is a 70 year old female previously seen for dizziness and imbalance, currently s/p C3-6 ACDF (DOS: 03/24/21). No significant complications post-op. She reports some irritation recently along upper periscapular region. Patient is currently donning neck brace until next follow-up with her surgeon. She is refraining from cervical AROM per instructions from physician.  Patient reports using brace the entire day except for completing hygiene tasks, eating, and sleeping. Patient currently has precautions for lifting > one gallon per report;  physician's notes state no lifting > 10 pounds in first 6 weeks. Pt is precautioned from bending and stooping at this time. Patient does have remaining neuropathic symptoms on RUE/RLE. Patient reports no notable sciatic-type symptoms at this time. Patient reports no more urinary urgency/frequency than usual; she does occasionally feel hurried to get to the bathroom. Patient reports no fever, chills, night sweats. Pt has worked on gait with home health; she has walked as far 208 feet. She reports significant difficulty with stair negotiation at this time. Patient reports no stairs to enter/exit home. She has to negotiate 14 steps to get to her bedroom - handrail on L side. Pt has tub shower with shower bench; no grab bars. Pt has to negotiate gravel to get into her home. Pt reports having intermittent slight dizziness, but "not that much" recently.    Limitations Walking;House hold activities;Standing    Diagnostic tests Intraoperative fluoroscopy - no post-op imaging yet    Patient Stated Goals Able to walk with improved steadiness                TREATMENT     Neuromuscular Re-education - for static and dynamic postural stability as needed to improve balance with performance of ADLs and household/community mobility tasks, equilibrium and non-equilibrium coordination to improve motor control with standing activities     In // bars: Forward heel-to-toe walking and retro step; 3x D/Bx   Standing saccades, horizontal and vertical; 2x20 each   Standing toe tap; 2x10 alternating, 6-inch step   3/4 Tandem stance; 2x30sec, bilateral   Sidestepping; 2x D/B length of parallel bars with Red Theraband above knees   Standing on Airex with feet together; 2x30sec  Standing on Airex, feet apart, eyes closed; 5x10sec     *not today* Standing feet apart, eyes closed; 2x30 sec, bilat Hurdle step (single foot step-over blue line with heel to toe) alternating feet; 2x10 alt Forward and retro  stepping; 5x D/B Half Tandem stance; 2x30sec, bilateral Standing Romberg; 3x30sec       Therapeutic Exercise - improved strength as needed to improve performance of CKC activities/functional movements and as needed for improved power production to prevent fall during episode of large postural perturbation   Ambulate laps around gym; x 3 laps with SPC RUE  LAQ; 2x10; 5-lb cuff weight, bilateral LE        *not today* Seated march; 2x10 [heaviness with lifting LLE] Ambulate laps around gym; x 1 laps with rollator  In // bars: Standing heel raise/toe raise, alternating; 2x10  Seated hip abduction, black Tband; 2x10            ASSESSMENT Patient has minimal dizziness at this time - these symptoms are continuous and not consistent with classic positional vertigo. Patient is able to continue progression of static and dynamic postural stability work in clinic today. Patient is significantly challenged and fatigued with progression of resisted side stepping today. She has ongoing L>RLE weakness, gait instability, and postural control deficits requiring ongoing  intervention. Patient has remaining deficits in LE strength, postural stability, gait changes, mild UE coordination deficits, dec cervical spine AROM (s/p fusion and currently in cervical spine collar throughout the day), and difficulty with transferring and stair/obstacle negotiation.  Patient will benefit from continued skilled therapeutic intervention to address the above deficits as needed for improved function and QoL.      PT Short Term Goals - 05/19/21 1330       PT SHORT TERM GOAL #1   Title Pt will be independent with HEP in order to improve strength and balance in order to decrease fall risk and improve function at home and work.    Baseline 04/15/21: HEP initiated.   05/19/21: Pt is complaint with HEP given in PT, initiating home exercises from surgeon this week    Time 3    Period Days    Status Achieved    Target Date  05/06/21      PT SHORT TERM GOAL #2   Title Patient will demonstrate modified independent gait with SPC for home-mobility distance (150 ft or greater) in clinic with no LOB and safe management of AD    Baseline 04/15/21: Patient using rollator as primary means of mobility.   05/19/21: Pt still using rollator as primary means of mobility; she has demonstrated gait in parallel bars with no AD with intermittent LOB requiring minA to maintain balance; will practice SPC ambulation next visit    Time 4    Period Weeks    Status Not Met    Target Date 05/13/21               PT Long Term Goals - 05/19/21 1332       PT LONG TERM GOAL #1   Title Pt will improve BERG by at least 3 points in order to demonstrate clinically significant improvement in balance.    Baseline 04/15/21: Baseline BERG to be obtained next visit.   04/19/21: 35/56.  05/18/21: 40/56    Time 8    Period Weeks    Status Achieved    Target Date 05/18/21      PT LONG TERM GOAL #2   Title Pt will decrease 5TSTS by at least 3 seconds in order to demonstrate clinically significant improvement in LE strength.    Baseline 04/15/21: 5TSTS 31 sec.   05/19/21: Improved by 4 seconds    Time 8    Period Weeks    Status Achieved    Target Date 05/18/21      PT LONG TERM GOAL #3   Title Pt will decrease TUG to below 14 seconds/decrease in order to demonstrate decreased fall risk.    Baseline 04/15/21: TUG 25 sec.   05/18/21: TUG 22.6 sec    Time 8    Period Weeks    Status Not Met    Target Date 06/10/21      PT LONG TERM GOAL #4   Title Patient will demonstrate improved function as evidenced by a score of 64 on FOTO measure for full participation in activities at home and in the community.    Baseline 04/15/21: FOTO 53.   05/18/21: FOTO 58    Time 8    Period Weeks    Status Not Met    Target Date 06/10/21                   Plan - 06/17/21 0845     Clinical Impression Statement Patient has minimal dizziness at this  time - these symptoms are continuous and not consistent with classic positional vertigo. Patient is able to continue progression of static and dynamic postural stability work in clinic today. Patient is significantly challenged and fatigued with progression of resisted side stepping today. She has ongoing L>RLE weakness, gait instability, and postural control deficits requiring ongoing intervention. Patient has remaining deficits in LE strength, postural stability, gait changes, mild UE coordination deficits, dec cervical spine AROM (s/p fusion and currently in cervical spine collar throughout the day), and difficulty with transferring and stair/obstacle negotiation.  Patient will benefit from continued skilled therapeutic intervention to address the above deficits as needed for improved function and QoL.    Personal Factors and Comorbidities Age;Comorbidity 3+    Comorbidities chronic kidney disease, Type II DM, peripheral neuropathy, hypertension, Hx of A-fib, cervical spine stenosis s/p C3-6 fusion    Examination-Activity Limitations Bed Mobility;Stairs;Stand;Locomotion Level;Transfers;Bend;Dressing    Examination-Participation Restrictions Community Activity;Cleaning    Stability/Clinical Decision Making Evolving/Moderate complexity    Rehab Potential Good    PT Frequency 2x / week    PT Duration 8 weeks    PT Treatment/Interventions Gait training;Stair training;Functional mobility training;Therapeutic activities;Therapeutic exercise;Balance training;Neuromuscular re-education;Patient/family education;ADLs/Self Care Home Management;Cryotherapy;Electrical Stimulation;Moist Heat    PT Next Visit Plan Continue with gait and balance re-training, LE strengthening. UE coordination work in sitting/standing. Continue strict observance of post-op restrictions. Pt continuing with cervical AROM drills given by surgeon; may use MT for paracervical sensitivity and soft tissue mobility as needed. Re-assess and  re-certification next visit.    PT Home Exercise Plan Access Code: [email or web address]    Consulted and Agree with Plan of Care Patient             Patient will benefit from skilled therapeutic intervention in order to improve the following deficits and impairments:  Abnormal gait, Postural dysfunction, Difficulty walking, Decreased balance, Decreased coordination, Decreased strength, Decreased activity tolerance  Visit Diagnosis: Difficulty in walking, not elsewhere classified  Imbalance  Muscle weakness (generalized)  S/P cervical spinal fusion     Problem List Patient Active Problem List   Diagnosis Date Noted   Cervical myelopathy ([place]) 03/24/2021   Anemia 12/03/2020   Essential hypertension 12/03/2020   Hypothyroidism 12/03/2020   Type 2 diabetes mellitus ([place]) 12/03/2020   Atrial fibrillation ([place]) 09/21/2018   Kidney transplant status 09/21/2018   Osteoporosis 09/21/2018   Valentina Gu, PT, DPT #[id number]  Eilleen Kempf, PT 06/17/2021, 8:47 AM  [facility] [address] [place], [place], [address] Phone: [phone]   Fax:  [phone]  Name: Jean Johnson MRN: [id number] Date of Birth: [birth year]

## 2021-06-17 ENCOUNTER — Encounter: Payer: 59 | Admitting: Physical Therapy

## 2021-06-22 ENCOUNTER — Ambulatory Visit: Payer: Medicare Other | Admitting: Physical Therapy

## 2021-06-22 NOTE — Patient Instructions (Incomplete)
°  °  OBJECTIVE FINDINGS   Gait Ambulating with rollator, forward lean onto AD; Decreased stance time LLE, mild Trendelenburg L, decreased single-limb support time bilaterally   Strength R/L 4/4 Hip flexion 4+/4+ Hip abduction (seated) 5/5 Hip adduction 4+/4 Knee extension 4/4- Knee flexion 5/4+ Ankle Dorsiflexion 4/4 Ankle Plantarflexion *indicates pain     AROM Cervical flexion:  40 Cervical extension: 32 Lateral flexion: Right 32, Left 21* Cervical rotation: Right 52, Left 48 *Indicates pain         FUNCTIONAL OUTCOME MEASURES         Results Comments  BERG 04/19/21: 35/56 05/18/21: 40/56 Fall risk, in need of intervention  TUG 04/15/21: 25 seconds 05/18/21: 22.6 seconds    5TSTS 04/15/21: 31 seconds 05/18/21: 27 seconds    10 Meter Gait Speed 04/15/21: Self-selected: s =  0.67 m/s;     05/18/21: Self-selected: s = 0.52 m/s Below normative values for full community ambulation      POSTURAL CONTROL TESTING            Rhomberg: Eyes Open 30 seconds, Eyes closed 8 seconds           TREATMENT     Neuromuscular Re-education - for static and dynamic postural stability as needed to improve balance with performance of ADLs and household/community mobility tasks, equilibrium and non-equilibrium coordination to improve motor control with standing activities     In // bars: Forward heel-to-toe walking and retro step; 3x D/Bx   Standing saccades, horizontal and vertical; 2x20 each   Standing toe tap; 2x10 alternating, 6-inch step   3/4 Tandem stance; 2x30sec, bilateral   Sidestepping; 2x D/B length of parallel bars with Red Theraband above knees   Standing on Airex with feet together; 2x30sec   Standing on Airex, feet apart, eyes closed; 5x10sec     *not today* Standing feet apart, eyes closed; 2x30 sec, bilat Hurdle step (single foot step-over blue line with heel to toe) alternating feet; 2x10 alt Forward and retro stepping; 5x D/B Half Tandem stance;  2x30sec, bilateral Standing Romberg; 3x30sec       Therapeutic Exercise - improved strength as needed to improve performance of CKC activities/functional movements and as needed for improved power production to prevent fall during episode of large postural perturbation   Ambulate laps around gym; x 3 laps with SPC RUE   LAQ; 2x10; 5-lb cuff weight, bilateral LE         *not today* Seated march; 2x10 [heaviness with lifting LLE] Ambulate laps around gym; x 1 laps with rollator  In // bars: Standing heel raise/toe raise, alternating; 2x10  Seated hip abduction, black Tband; 2x10            ASSESSMENT Patient has minimal dizziness at this time - these symptoms are continuous and not consistent with classic positional vertigo. Patient is able to continue progression of static and dynamic postural stability work in clinic today. Patient is significantly challenged and fatigued with progression of resisted side stepping today. She has ongoing L>RLE weakness, gait instability, and postural control deficits requiring ongoing intervention. Patient has remaining deficits in LE strength, postural stability, gait changes, mild UE coordination deficits, dec cervical spine AROM (s/p fusion and currently in cervical spine collar throughout the day), and difficulty with transferring and stair/obstacle negotiation.  Patient will benefit from continued skilled therapeutic intervention to address the above deficits as needed for improved function and QoL.

## 2021-06-24 ENCOUNTER — Ambulatory Visit: Payer: Medicare Other | Admitting: Physical Therapy

## 2021-06-29 ENCOUNTER — Encounter: Payer: Self-pay | Admitting: Physical Therapy

## 2021-06-29 ENCOUNTER — Ambulatory Visit: Payer: Medicare Other | Admitting: Physical Therapy

## 2021-06-29 ENCOUNTER — Other Ambulatory Visit: Payer: Self-pay

## 2021-06-29 DIAGNOSIS — R262 Difficulty in walking, not elsewhere classified: Secondary | ICD-10-CM

## 2021-06-29 DIAGNOSIS — Z981 Arthrodesis status: Secondary | ICD-10-CM

## 2021-06-29 DIAGNOSIS — R2689 Other abnormalities of gait and mobility: Secondary | ICD-10-CM

## 2021-06-29 DIAGNOSIS — M6281 Muscle weakness (generalized): Secondary | ICD-10-CM

## 2021-06-29 NOTE — Therapy (Signed)
[facility] [address]. [place], [place], [address] Phone: [phone]   Fax:  [phone]  Physical Therapy Treatment/ Physical Therapy Progress Note/Re-certification   Dates of reporting period  05/18/21   to   06/29/21   Patient Details  Name: Jean Johnson MRN: [id number] Date of Birth: 1951/03/24 Referring Provider (PT): Meade Maw, MD   Encounter Date: 06/29/2021   PT End of Session - 06/29/21 1440     Visit Number 15    Number of Visits 17    Date for PT Re-Evaluation 06/10/21    Authorization Type Medicare and [employer] - VL based on medical necessity, no auth required    Authorization Time Period initial eval 04/15/21, progress note 06/29/21; Cert 45/80/99-8/33/82    Progress Note Due on Visit 17    PT Start Time 1435    PT Stop Time 1525    PT Time Calculation (min) 50 min    Equipment Utilized During Treatment Gait belt   rollator for negotiating clinic   Activity Tolerance Patient tolerated treatment well;Patient limited by fatigue    Behavior During Therapy [facility] for tasks assessed/performed             Past Medical History:  Diagnosis Date   Anemia of chronic renal disease    Aortic stenosis, mild    a.) TTE on 11/23/2020 --> mean gradient 9.7 mmHg   Atrial fibrillation (HCC)    a.) CHA2DS2-VASc = 3 (age, sex, diabetes). b.) daily apixaban   B12 deficiency    Cervical spinal stenosis    Chronic anticoagulation    Apixaban   Diabetic neuropathy (HCC)    Diverticulosis    Dyspnea    ESRD (end stage renal disease) (HCC)    First degree AV block    HLD (hyperlipidemia)    Hypertension    Incomplete right bundle branch block (RBBB)    LAE (left atrial enlargement)    a.) TTE on 11/23/2020 --> moderate   Left thyroid nodule    a.) Cervical MRI on 12/29/2020 --> measured "at least" 3.5 cm; imcompletely imaged.   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   Valvular heart disease    a.) TTE 11/23/2020 --> mild panvalvular regurgitation    Past Surgical History:  Procedure Laterality Date   ANTERIOR CERVICAL DECOMP/DISCECTOMY FUSION N/A 03/24/2021   Procedure: C3-6 ANTERIOR CERVICAL DECOMPRESSION/DISCECTOMY FUSION 3 LEVELS;  Surgeon: Meade Maw, MD;  Location: [facility];  Service: Neurosurgery;  Laterality: N/A;   CATARACT EXTRACTION W/PHACO Left 10/21/2020   Procedure: CATARACT EXTRACTION PHACO AND INTRAOCULAR LENS PLACEMENT ([place]) DIABETES LEFT;  Surgeon: Leandrew Koyanagi, MD;  Location: [place];  Service: Ophthalmology;  Laterality: Left;  2.84 0:48.8 5.8%   EYE SURGERY Right 2020   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   Subjective Assessment - 06/29/21 1438     Subjective Patient was told by  her surgeon that she may wean from use of cervical collar; she was told to use it only on as-needed basis and during longer car rides. She arrives donning no collar today. She reports feeling L-sided "strain" in paracervical and L side of trunk with L cervical spine lateral flexion; surgeon instructed her on refraining from this. Pt is undergoing further workup and labs for kidney function. Patient follows up with nephrologist tomorrow. She is awaiting follow-up with neurology. Patient reports her neck is doing "okay" at arrival to PT. She reports intermittently feeling "tight" along cervical spine during  the night. Patient reports 80% SANE score at this time. She reports "very little" dizziness this afternoon - she has constant symptoms versus intermittent dizziness. Patient reports fair progress with re-gaining her strength and walking ability. She states she wants her balance to get better.    Pertinent History Patient is a 70 year old female previously seen for dizziness and imbalance, currently s/p C3-6 ACDF (DOS: 03/24/21). No significant complications post-op. She reports some irritation recently along upper periscapular region. Patient is currently donning neck brace until next follow-up with her surgeon. She is refraining from cervical AROM per instructions from physician.  Patient reports using brace the entire day except for completing hygiene tasks, eating, and sleeping. Patient currently has precautions for lifting > one gallon per report; physician's notes state no lifting > 10 pounds in first 6 weeks. Pt is precautioned from bending and stooping at this time. Patient does have remaining neuropathic symptoms on RUE/RLE. Patient reports no notable sciatic-type symptoms at this time. Patient reports no more urinary urgency/frequency than usual; she does occasionally feel hurried to get to the bathroom. Patient reports no fever, chills, night sweats. Pt has worked on gait with home health; she has walked as far 208 feet. She reports significant difficulty with stair negotiation at this time. Patient reports no stairs to enter/exit home. She has to negotiate 14 steps to get to her bedroom - handrail on L side. Pt has tub shower with shower bench; no grab bars. Pt has to negotiate gravel to get into her home. Pt reports having intermittent slight dizziness, but "not that much" recently.    Limitations Walking;House hold activities;Standing    Diagnostic tests Intraoperative fluoroscopy - no post-op imaging yet    Patient Stated Goals Able to walk with improved steadiness               OBJECTIVE  FINDINGS   Observation Incision well healed at this time  Gait Ambulating with rollator, forward lean onto AD; mild Trendelenburg L, decreased single-limb support time bilaterally, pt ambulates with lower limbs externally rotated R>L   Strength R/L 4+/4 Hip flexion 5-/5- Hip abduction (seated) 5/5 Hip adduction 4+/4+ Knee extension 4/4 Knee flexion 5/5 Ankle Dorsiflexion 4/4 Ankle Plantarflexion *indicates pain     AROM Cervical flexion:  42 Cervical extension: 33 Lateral flexion: Right 38, Left 26* Cervical rotation: Right 55, Left 70 *Indicates pain         FUNCTIONAL OUTCOME MEASURES         Results Comments  BERG 04/19/21: 35/56 05/18/21: 40/56 06/29/21: 38/56 Fall risk, in need of intervention  TUG 04/15/21: 25 seconds 05/18/21: 22.6 seconds 06/29/21: 20.1 seconds  Performed using rollator. Above cut-off score for fall risk  5TSTS 04/15/21: 31 seconds 05/18/21: 27 seconds 06/29/21: 23 seconds  Fall risk  10 Meter Gait Speed 04/15/21: Self-selected: s =  0.67 m/s;     05/18/21: Self-selected: s = 0.52 m/s  06/30/19: Self-selected: = 0.64 m/s Below normative values for full community ambulation      POSTURAL CONTROL TESTING            Rhomberg: Eyes Open 60 seconds, Eyes closed 8 seconds          TREATMENT    Manual Therapy - for symptom modulation, soft tissue sensitivity and mobility as needed for cervical spine ROM  L upper trapezius DTM/TPR in sitting   Moist Heat Pack (unbilled) utilized following manual therapy for analgesic effect and improved soft tissue extensibility, along L upper trapezius; x 5 minutes in sitting    Physical Performance Testing   Performance of functional outcome measures, gait assessment, strength and ROM testing.   Patient education on results obtained and goals of PT moving forward    Neuromuscular Re-education - for static and dynamic postural stability as needed to improve balance with performance of ADLs and  household/community mobility tasks, equilibrium and non-equilibrium coordination to improve motor control with standing activities    *next visit* In // bars: Forward heel-to-toe walking and retro step; 3x D/Bx Standing saccades, horizontal and vertical; 2x20 each Standing toe tap; 2x10 alternating, 6-inch step 3/4 Tandem stance; 2x30sec, bilateral Sidestepping; 2x D/B length of parallel bars with Red Theraband above knees Standing on Airex with feet together; 2x30sec Standing on Airex, feet apart, eyes closed; 5x10sec     *not today* Standing feet apart, eyes closed; 2x30 sec, bilat Hurdle step (single foot step-over blue line with heel to toe) alternating feet; 2x10 alt Forward and retro stepping; 5x D/B Half Tandem stance; 2x30sec, bilateral Standing Romberg; 3x30sec       Therapeutic Exercise - improved strength as needed to improve performance of CKC activities/functional movements and as needed for improved power production to prevent fall during episode of large postural perturbation    *not today* Ambulate laps around gym; x 3 laps with SPC RUE LAQ; 2x10; 5-lb cuff weight, bilateral LE Seated march; 2x10 [heaviness with lifting LLE] Ambulate laps around gym; x 1 laps with rollator  In // bars: Standing heel raise/toe raise, alternating; 2x10  Seated hip abduction, black Tband; 2x10            ASSESSMENT Patient has made notable progress since last progress note with impoved time for 5TSTS and TUG. She has improved FOTO score, but she has not yet met long-term goal. Her BERG is slightly lower than last progress note, but she still has increase that is clinically significant compared to initial evaluation. Patient does have some occulomotor deficits and impaired VOR cancellation per quick exam completed on 06/08/21 related to dizziness/vertigo complaints. Dizziness is minimal at this time, though she does experience continuous spinning symptoms at low intensity. She has  remaining gait instability and does require continued use of walker at this time for safety. No change in self-selected gait speed. Patient has remaining deficits in LE strength, postural stability, gait changes, mild UE coordination deficits, dec cervical spine AROM (s/p fusion, full AROM not expected), impaired smooth pursuit (upward/downward) and saccades, impaired VOR cancellation, and difficulty with transferring and stair/obstacle negotiation.  Patient will benefit from continued skilled therapeutic intervention to address the above deficits as needed for improved function and QoL.         PT Short Term Goals - 06/29/21 1554       PT SHORT TERM GOAL #1   Title Pt will be independent with HEP in order to improve strength and balance in order to decrease  fall risk and improve function at home and work.    Baseline 04/15/21: HEP initiated.   05/19/21: Pt is complaint with HEP given in PT, initiating home exercises from surgeon this week.   06/29/21: compliant and independent with hom exercise program.    Time 3    Period Days    Status Achieved    Target Date 05/06/21      PT SHORT TERM GOAL #2   Title Patient will demonstrate modified independent gait with SPC for home-mobility distance (150 ft or greater) in clinic with no LOB and safe management of AD    Baseline 04/15/21: Patient using rollator as primary means of mobility.   05/19/21: Pt still using rollator as primary means of mobility; she has demonstrated gait in parallel bars with no AD with intermittent LOB requiring minA to maintain balance; will practice SPC ambulation next visit.   06/29/21: Patient ambulates with SPC with intermittent ModA required to prevent LOB for home mobility distance.    Time 4    Period Weeks    Status Partially Met    Target Date 05/13/21               PT Long Term Goals - 06/29/21 1445       PT LONG TERM GOAL #1   Title Pt will improve BERG by at least 3 points in order to demonstrate  clinically significant improvement in balance.    Baseline 04/15/21: Baseline BERG to be obtained next visit.   04/19/21: 35/56.  05/18/21: 40/56   06/29/21: 38/56    Time 8    Period Weeks    Status Achieved    Target Date 05/18/21      PT LONG TERM GOAL #2   Title Pt will decrease 5TSTS by at least 3 seconds in order to demonstrate clinically significant improvement in LE strength.    Baseline 04/15/21: 5TSTS 31 sec.   05/19/21: Improved by 4 seconds.   06/29/21: 5TSTS improved by 8 seconds relative to initial eval (23 sec).    Time 8    Period Weeks    Status Achieved    Target Date 05/18/21      PT LONG TERM GOAL #3   Title Pt will decrease TUG to below 14 seconds/decrease in order to demonstrate decreased fall risk.    Baseline 04/15/21: TUG 25 sec.   05/18/21: TUG 22.6 sec.   06/29/21:  TUG 20.1 sec    Time 8    Period Weeks    Status Not Met    Target Date 06/10/21      PT LONG TERM GOAL #4   Title Patient will demonstrate improved function as evidenced by a score of 64 on FOTO measure for full participation in activities at home and in the community.    Baseline 04/15/21: FOTO 53.   05/18/21: FOTO 58.   06/29/21: FOTO 60.    Time 8    Period Weeks    Status Not Met    Target Date 06/10/21                   Plan - 06/30/21 1322     Clinical Impression Statement Patient has made notable progress since last progress note with impoved time for 5TSTS and TUG. She has improved FOTO score, but she has not yet met long-term goal. Her BERG is slightly lower than last progress note, but she still has increase that is clinically significant compared to initial  evaluation. Patient does have some occulomotor deficits and impaired VOR cancellation per quick exam completed on 06/08/21 related to dizziness/vertigo complaints. Dizziness is minimal at this time, though she does experience continuous spinning symptoms at low intensity. She has remaining gait instability and does require  continued use of walker at this time for safety. No change in self-selected gait speed. Patient has remaining deficits in LE strength, postural stability, gait changes, mild UE coordination deficits, dec cervical spine AROM (s/p fusion, full AROM not expected), impaired smooth pursuit (upward/downward) and saccades, impaired VOR cancellation, and difficulty with transferring and stair/obstacle negotiation.  Patient will benefit from continued skilled therapeutic intervention to address the above deficits as needed for improved function and QoL.    Personal Factors and Comorbidities Age;Comorbidity 3+    Comorbidities chronic kidney disease, Type II DM, peripheral neuropathy, hypertension, Hx of A-fib, cervical spine stenosis s/p C3-6 fusion    Examination-Activity Limitations Bed Mobility;Stairs;Stand;Locomotion Level;Transfers;Bend;Dressing    Examination-Participation Restrictions Community Activity;Cleaning    Stability/Clinical Decision Making Evolving/Moderate complexity    Rehab Potential Good    PT Frequency 2x / week    PT Duration 6 weeks    PT Treatment/Interventions Gait training;Stair training;Functional mobility training;Therapeutic activities;Therapeutic exercise;Balance training;Neuromuscular re-education;Patient/family education;ADLs/Self Care Home Management;Cryotherapy;Electrical Stimulation;Moist Heat    PT Next Visit Plan Continue with gait and balance re-training, LE strengthening. UE coordination work in sitting/standing. Pt continuing with cervical AROM drills given by surgeon; may use MT for paracervical sensitivity and soft tissue mobility as needed. Recommend continued PT 2x/week for 6 weeks    PT Home Exercise Plan Access Code: [email or web address]    Consulted and Agree with Plan of Care Patient             Patient will benefit from skilled therapeutic intervention in order to improve the following deficits and impairments:  Abnormal gait, Postural dysfunction, Difficulty walking,  Decreased balance, Decreased coordination, Decreased strength, Decreased activity tolerance  Visit Diagnosis: Difficulty in walking, not elsewhere classified - Plan: PT plan of care cert/re-cert  Imbalance - Plan: PT plan of care cert/re-cert  Muscle weakness (generalized) - Plan: PT plan of care cert/re-cert  S/P cervical spinal fusion - Plan: PT plan of care cert/re-cert     Problem List Patient Active Problem List   Diagnosis Date Noted   Cervical myelopathy ([place]) 03/24/2021   Anemia 12/03/2020   Essential hypertension 12/03/2020   Hypothyroidism 12/03/2020   Type 2 diabetes mellitus ([place]) 12/03/2020   Atrial fibrillation ([place]) 09/21/2018   Kidney transplant status 09/21/2018   Osteoporosis 09/21/2018   Valentina Gu, PT, DPT #[id number]  Eilleen Kempf, PT 06/30/2021, 1:28 PM  [facility] [address] [place], [place], [address] Phone: [phone]   Fax:  [phone]  Name: Jean Johnson MRN: [id number] Date of Birth: 1951/03/11

## 2021-07-01 ENCOUNTER — Other Ambulatory Visit: Payer: Self-pay

## 2021-07-01 ENCOUNTER — Ambulatory Visit: Payer: Medicare Other

## 2021-07-01 DIAGNOSIS — Z981 Arthrodesis status: Secondary | ICD-10-CM

## 2021-07-01 DIAGNOSIS — M6281 Muscle weakness (generalized): Secondary | ICD-10-CM

## 2021-07-01 DIAGNOSIS — R262 Difficulty in walking, not elsewhere classified: Secondary | ICD-10-CM | POA: Diagnosis not present

## 2021-07-01 DIAGNOSIS — R2689 Other abnormalities of gait and mobility: Secondary | ICD-10-CM

## 2021-07-01 NOTE — Therapy (Signed)
[place] [facility] [address] [place], [place], [address] Phone: [phone]   Fax:  [phone]  Physical Therapy Treatment  Patient Details  Name: Jean Johnson MRN: [id number] Date of Birth: 07-20-1950 Referring Provider (PT): Meade Maw, MD   Encounter Date: 07/01/2021   PT End of Session - 07/01/21 1533     Visit Number 16    Number of Visits 17    Date for PT Re-Evaluation 06/10/21    Authorization Type Medicare and [employer] - VL based on medical necessity, no auth required    Authorization Time Period initial eval 04/15/21, progress note 06/29/21; Cert 38/17/71-1/65/79    Progress Note Due on Visit 17    PT Start Time 1435    PT Stop Time 1525    PT Time Calculation (min) 50 min    Equipment Utilized During Treatment Gait belt   rollator for negotiating clinic   Activity Tolerance Patient tolerated treatment well;Patient limited by fatigue    Behavior During Therapy [facility] for tasks assessed/performed             Past Medical History:  Diagnosis Date   Anemia of chronic renal disease    Aortic stenosis, mild    a.) TTE on 11/23/2020 --> mean gradient 9.7 mmHg   Atrial fibrillation (HCC)    a.) CHA2DS2-VASc = 3 (age, sex, diabetes). b.) daily apixaban   B12 deficiency    Cervical spinal stenosis    Chronic anticoagulation    Apixaban   Diabetic neuropathy (HCC)    Diverticulosis    Dyspnea    ESRD (end stage renal disease) (HCC)    First degree AV block    HLD (hyperlipidemia)    Hypertension    Incomplete right bundle branch block (RBBB)    LAE (left atrial enlargement)    a.) TTE on 11/23/2020 --> moderate   Left thyroid nodule    a.) Cervical MRI on 12/29/2020 --> measured "at least" 3.5 cm; imcompletely imaged.   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   Valvular heart disease    a.) TTE 11/23/2020 --> mild panvalvular regurgitation    Past Surgical History:  Procedure Laterality Date   ANTERIOR CERVICAL DECOMP/DISCECTOMY FUSION N/A 03/24/2021   Procedure: C3-6 ANTERIOR CERVICAL DECOMPRESSION/DISCECTOMY FUSION 3 LEVELS;  Surgeon: Meade Maw, MD;  Location: [facility];  Service: Neurosurgery;  Laterality: N/A;   CATARACT EXTRACTION W/PHACO Left 10/21/2020   Procedure: CATARACT EXTRACTION PHACO AND INTRAOCULAR LENS PLACEMENT ([place]) DIABETES LEFT;  Surgeon: Leandrew Koyanagi, MD;  Location: [place];  Service: Ophthalmology;  Laterality: Left;  2.84 0:48.8 5.8%   EYE SURGERY Right 2020   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   Subjective Assessment - 07/01/21 1530     Subjective Pt reports she saw her cardiologist yesterday and she states they did not think her dizziness is cardiac related and that she should  follow up with her neurologist and ENT about the dizziness sx.  She reports moderate compliance with her HEP.  She notes having difficulty with her strength, balance, and gait.  She feels like her L LE continues to be weaker.    Pertinent History Patient is a 70 year old female previously seen for dizziness and imbalance, currently s/p C3-6 ACDF (DOS: 03/24/21). No significant complications post-op. She reports some irritation recently along upper periscapular region. Patient is currently donning neck brace until next follow-up with her surgeon. She is refraining from cervical AROM per instructions  from physician.  Patient reports using brace the entire day except for completing hygiene tasks, eating, and sleeping. Patient currently has precautions for lifting > one gallon per report; physician's notes state no lifting > 10 pounds in first 6 weeks. Pt is precautioned from bending and stooping at this time. Patient does have remaining neuropathic symptoms on RUE/RLE. Patient reports no notable sciatic-type symptoms at this time. Patient reports no more urinary urgency/frequency than usual; she does occasionally feel hurried to get to the bathroom. Patient reports no fever, chills, night sweats. Pt has worked on gait with home health; she has walked as far 208 feet. She reports significant difficulty with stair negotiation at this time. Patient reports no stairs to enter/exit home. She has to negotiate 14 steps to get to her bedroom - handrail on L side. Pt has tub shower with shower bench; no grab bars. Pt has to negotiate gravel to get into her home. Pt reports having intermittent slight dizziness, but "not that much" recently.    Limitations Walking;House hold activities;Standing    Diagnostic tests Intraoperative fluoroscopy - no post-op imaging yet    Patient Stated Goals Able to walk with improved steadiness                TREATMENT   Neuromuscular Re-education - for static and dynamic postural stability as needed to improve balance with performance of ADLs and household/community mobility tasks, equilibrium and non-equilibrium coordination to improve motor control with standing activities   In // bars: Forward heel-to-toe walking and retro step; 3x D/Bx Standing saccades, horizontal and vertical; 2x20 each Standing toe tap; 2x10 alternating, 6-inch step 3/4 Tandem stance; 2x30sec, bilateral Hurdle step focusing on heel strike at initial contact, holding bilateral parallel bars 4x   *not today* Standing feet apart, eyes closed; 2x30 sec, bilat Forward and retro stepping; 5x  D/B Standing Romberg; 3x30sec  Sidestepping; 2x D/B length of parallel bars with Red Theraband above knees Standing on Airex with feet together; 2x30sec Standing on Airex, feet apart, eyes closed; 5x10sec     Therapeutic Exercise - improved strength as needed to improve performance of CKC activities/functional movements and as needed for improved power production to prevent fall during episode of large postural perturbation  In // bars: Standing heel raise/toe raise, alternating; 2x10  Seated hip abduction, black Tband; 2x10  Seated march; 2x10 [heaviness with lifting LLE] LAQ; 2x10; 5-lb cuff weight, bilateral LE  *not today* Ambulate laps around gym; x 3 laps with SPC RUE Ambulate laps around gym; x 1 laps with rollator       ASSESSMENT  Pt was challenged with LE strengthening and balance exercises today.  Impaired foot placement, step length, and heel strike gait pattern noted during execises in parallel bars.  Pt had difficulty maintaining COM positioned over BOS during modified tandem stance.  Overall she tolerated session well and notes fatigue by end of tx.  PT Short Term Goals - 06/29/21 1554       PT SHORT TERM GOAL #1   Title Pt will be independent with HEP in order to improve strength and balance in order to decrease fall risk and improve function at home and work.    Baseline 04/15/21: HEP initiated.   05/19/21: Pt is complaint with HEP given in PT, initiating home exercises from surgeon this week.   06/29/21: compliant and independent with hom exercise program.    Time 3    Period Days    Status Achieved    Target Date 05/06/21      PT SHORT TERM GOAL #2   Title Patient will demonstrate modified independent gait with SPC for home-mobility distance (150 ft or greater) in clinic with no LOB and safe management of AD    Baseline 04/15/21: Patient using rollator as primary means of mobility.   05/19/21: Pt still using rollator as primary means of mobility; she has  demonstrated gait in parallel bars with no AD with intermittent LOB requiring minA to maintain balance; will practice SPC ambulation next visit.   06/29/21: Patient ambulates with SPC with intermittent ModA required to prevent LOB for home mobility distance.    Time 4    Period Weeks    Status Partially Met    Target Date 05/13/21               PT Long Term Goals - 06/29/21 1445       PT LONG TERM GOAL #1   Title Pt will improve BERG by at least 3 points in order to demonstrate clinically significant improvement in balance.    Baseline 04/15/21: Baseline BERG to be obtained next visit.   04/19/21: 35/56.  05/18/21: 40/56   06/29/21: 38/56    Time 8    Period Weeks    Status Achieved    Target Date 05/18/21      PT LONG TERM GOAL #2   Title Pt will decrease 5TSTS by at least 3 seconds in order to demonstrate clinically significant improvement in LE strength.    Baseline 04/15/21: 5TSTS 31 sec.   05/19/21: Improved by 4 seconds.   06/29/21: 5TSTS improved by 8 seconds relative to initial eval (23 sec).    Time 8    Period Weeks    Status Achieved    Target Date 05/18/21      PT LONG TERM GOAL #3   Title Pt will decrease TUG to below 14 seconds/decrease in order to demonstrate decreased fall risk.    Baseline 04/15/21: TUG 25 sec.   05/18/21: TUG 22.6 sec.   06/29/21:  TUG 20.1 sec    Time 8    Period Weeks    Status Not Met    Target Date 06/10/21      PT LONG TERM GOAL #4   Title Patient will demonstrate improved function as evidenced by a score of 64 on FOTO measure for full participation in activities at home and in the community.    Baseline 04/15/21: FOTO 53.   05/18/21: FOTO 58.   06/29/21: FOTO 60.    Time 8    Period Weeks    Status Not Met    Target Date 06/10/21                   Plan - 07/01/21 1534     Clinical Impression Statement Pt was challenged with LE strengthening and balance exercises today.  Impaired  foot placement, step length, and heel strike  gait pattern noted during execises in parallel bars.  Pt had difficulty maintaining COM positioned over BOS during modified tandem stance.  Overall she tolerated session well and notes fatigue by end of tx.    Personal Factors and Comorbidities Age;Comorbidity 3+    Comorbidities chronic kidney disease, Type II DM, peripheral neuropathy, hypertension, Hx of A-fib, cervical spine stenosis s/p C3-6 fusion    Examination-Activity Limitations Bed Mobility;Stairs;Stand;Locomotion Level;Transfers;Bend;Dressing    Examination-Participation Restrictions Community Activity;Cleaning    Stability/Clinical Decision Making Evolving/Moderate complexity    Rehab Potential Good    PT Frequency 2x / week    PT Duration 6 weeks    PT Treatment/Interventions Gait training;Stair training;Functional mobility training;Therapeutic activities;Therapeutic exercise;Balance training;Neuromuscular re-education;Patient/family education;ADLs/Self Care Home Management;Cryotherapy;Electrical Stimulation;Moist Heat    PT Next Visit Plan Continue with gait and balance re-training, LE strengthening. UE coordination work in sitting/standing. Pt continuing with cervical AROM drills given by surgeon; may use MT for paracervical sensitivity and soft tissue mobility as needed. Recommend continued PT 2x/week for 6 weeks    PT Home Exercise Plan Access Code: [email or web address]    Consulted and Agree with Plan of Care Patient             Patient will benefit from skilled therapeutic intervention in order to improve the following deficits and impairments:  Abnormal gait, Postural dysfunction, Difficulty walking, Decreased balance, Decreased coordination, Decreased strength, Decreased activity tolerance  Visit Diagnosis: Difficulty in walking, not elsewhere classified  Imbalance  Muscle weakness (generalized)  S/P cervical spinal fusion     Problem List Patient Active Problem List   Diagnosis Date Noted   Cervical myelopathy ([place])  03/24/2021   Anemia 12/03/2020   Essential hypertension 12/03/2020   Hypothyroidism 12/03/2020   Type 2 diabetes mellitus ([place]) 12/03/2020   Atrial fibrillation ([place]) 09/21/2018   Kidney transplant status 09/21/2018   Osteoporosis 09/21/2018    Pincus Badder, PT 07/01/2021, 3:45 PM Merdis Delay, PT, DPT  [phone]  [facility] [address] [place], [place], [address] Phone: [phone]   Fax:  [phone]  Name: Jean Johnson MRN: [id number] Date of Birth: January 11, 1951

## 2021-07-07 ENCOUNTER — Other Ambulatory Visit: Payer: Self-pay

## 2021-07-07 ENCOUNTER — Ambulatory Visit: Payer: Medicare Other | Admitting: Physical Therapy

## 2021-07-07 VITALS — BP 143/52 | HR 57

## 2021-07-07 DIAGNOSIS — M6281 Muscle weakness (generalized): Secondary | ICD-10-CM

## 2021-07-07 DIAGNOSIS — R262 Difficulty in walking, not elsewhere classified: Secondary | ICD-10-CM | POA: Diagnosis not present

## 2021-07-07 DIAGNOSIS — R2689 Other abnormalities of gait and mobility: Secondary | ICD-10-CM

## 2021-07-07 DIAGNOSIS — Z981 Arthrodesis status: Secondary | ICD-10-CM

## 2021-07-07 NOTE — Therapy (Addendum)
[facility] [address] [place], [place], [address] Phone: [phone]   Fax:  [phone]  Physical Therapy Treatment  Patient Details  Name: Jean Johnson MRN: [id number] Date of Birth: [birth year] Referring Provider (PT): Meade Maw, MD   Encounter Date: 07/07/2021   PT End of Session - 07/07/21 1426     Visit Number 17    Number of Visits 25    Date for PT Re-Evaluation 06/10/21    Authorization Type Medicare and [employer] - VL based on medical necessity, no auth required    Authorization Time Period initial eval 04/15/21, progress note 06/29/21; Cert 54/49/20-1/00/71    Progress Note Due on Visit 25    PT Start Time 1419    PT Stop Time 1505    PT Time Calculation (min) 46 min    Equipment Utilized During Treatment Gait belt   rollator for negotiating clinic   Activity Tolerance Patient tolerated treatment well;Patient limited by fatigue    Behavior During Therapy [facility] for tasks assessed/performed             Past Medical History:  Diagnosis Date   Anemia of chronic renal disease    Aortic stenosis, mild    a.) TTE on 11/23/2020 --> mean gradient 9.7 mmHg   Atrial fibrillation (HCC)    a.) CHA2DS2-VASc = 3 (age, sex, diabetes). b.) daily apixaban   B12 deficiency    Cervical spinal stenosis    Chronic anticoagulation    Apixaban   Diabetic neuropathy (HCC)    Diverticulosis    Dyspnea    ESRD (end stage renal disease) (HCC)    First degree AV block    HLD (hyperlipidemia)    Hypertension    Incomplete right bundle branch block (RBBB)    LAE (left atrial enlargement)    a.) TTE on 11/23/2020 --> moderate   Left thyroid nodule    a.) Cervical MRI on 12/29/2020 --> measured "at least" 3.5 cm; imcompletely imaged.   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   Valvular heart disease    a.) TTE 11/23/2020 --> mild panvalvular regurgitation    Past Surgical History:  Procedure Laterality Date   ANTERIOR CERVICAL DECOMP/DISCECTOMY FUSION N/A 03/24/2021   Procedure: C3-6 ANTERIOR CERVICAL DECOMPRESSION/DISCECTOMY FUSION 3 LEVELS;  Surgeon: Meade Maw, MD;  Location: [facility];  Service: Neurosurgery;  Laterality: N/A;   CATARACT EXTRACTION W/PHACO Left 10/21/2020   Procedure: CATARACT EXTRACTION PHACO AND INTRAOCULAR LENS PLACEMENT ([place]) DIABETES LEFT;  Surgeon: Leandrew Koyanagi, MD;  Location: [facility];  Service: Ophthalmology;  Laterality: Left;  2.84 0:48.8 5.8%   EYE SURGERY Right 2020   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   Living donor organ recipient (sister)   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   Cadaveric organ recipient    Vitals:   07/07/21 1424 07/07/21 1427  BP: (!) 157/49 (!) 143/52  Pulse:  (!) 57     Subjective Assessment - 07/07/21 1420     Subjective Patient reports continuous dizziness. She was told that her dizziness was  not related to cardiac issues. Patient reports feeling moderately lightheaded at arrival to PT. Patient reports still needing continued work on her gait/steadiness.    Pertinent History Patient is a 70 year old female previously seen for dizziness and imbalance, currently s/p C3-6 ACDF (DOS: 03/24/21). No significant complications post-op. She reports some irritation recently along upper periscapular region. Patient is currently donning neck brace until next follow-up with her surgeon. She is refraining from cervical AROM per instructions from physician.  Patient reports using brace the entire day except for  completing hygiene tasks, eating, and sleeping. Patient currently has precautions for lifting > one gallon per report; physician's notes state no lifting > 10 pounds in first 6 weeks. Pt is precautioned from bending and stooping at this time. Patient does have remaining neuropathic symptoms on RUE/RLE. Patient reports no notable sciatic-type symptoms at this time. Patient reports no more urinary urgency/frequency than usual; she does occasionally feel hurried to get to the bathroom. Patient reports no fever, chills, night sweats. Pt has worked on gait with home health; she has walked as far 208 feet. She reports significant difficulty with stair negotiation at this time. Patient reports no stairs to enter/exit home. She has to negotiate 14 steps to get to her bedroom - handrail on L side. Pt has tub shower with shower bench; no grab bars. Pt has to negotiate gravel to get into her home. Pt reports having intermittent slight dizziness, but "not that much" recently.    Limitations Walking;House hold activities;Standing    Diagnostic tests Intraoperative fluoroscopy - no post-op imaging yet    Patient Stated Goals Able to walk with improved steadiness               TREATMENT   Neuromuscular Re-education - for static and dynamic postural stability as needed to improve balance with performance of ADLs and household/community mobility tasks, equilibrium and non-equilibrium coordination to improve motor control with standing activities   In // bars: Forward heel-to-toe walking and retro step; 3x D/Bx [verbal cueing and demo for arm swing and heel strike at initial contact] Standing saccades on Airex, horizontal and vertical; 2x20 each Standing VOR x 1 (slow cadence); x20 horiz and vert  Standing toe tap; 2x10 alternating, 6-inch step Standing on Airex, feet apart, eyes closed;  1x8 sec, 1x10sec, 1x15sec  *next visit* Hurdle step focusing on heel strike at initial contact, holding bilateral parallel  bars 4x   *not today* Standing feet apart, eyes closed; 2x30 sec, bilat Forward and retro stepping; 5x D/B Standing Romberg; 3x30sec Sidestepping; 2x D/B length of parallel bars with Red Theraband above knees Standing on Airex with feet together; 2x30sec      Therapeutic Exercise - improved strength as needed to improve performance of CKC activities/functional movements and as needed for improved power production to prevent fall during episode of large postural perturbation   Ambulate laps around gym; x 3 laps with SPC RUE    *not today* In // bars: Standing heel raise/toe raise, alternating; 2x10  Seated hip abduction, black Tband; 2x10  Seated march; 2x10 [heaviness with lifting LLE] LAQ; 2x10; 5-lb cuff weight, bilateral LE Ambulate laps around gym; x 1 laps with rollator        ASSESSMENT Pt has ongoing dizziness that is not explained by orthostatic hypotension given ongoing high SBP noted in clinic and continuous symptoms versus dizziness related to change in position. She demonstrates improving gait pattern with SPC, though pt intermittently exhibits cross-over gait pattern/ataxia and has 2  episodes of requiring ModA from PT to prevent significant loss of gait stability during ambulation trials. She has ongoing static postural control deficits and LE strength deficits requiring continued PT intervention. Patient has remaining deficits in LE strength, postural stability, gait changes, mild UE coordination deficits, dec cervical spine AROM (s/p fusion, full AROM not expected), impaired smooth pursuit (upward/downward) and saccades, impaired VOR cancellation, and difficulty with transferring and stair/obstacle negotiation.  Patient will benefit from continued skilled therapeutic intervention to address the above deficits as needed for improved function and QoL.      PT Short Term Goals - 06/29/21 1554       PT SHORT TERM GOAL #1   Title Pt will be independent with HEP in order  to improve strength and balance in order to decrease fall risk and improve function at home and work.    Baseline 04/15/21: HEP initiated.   05/19/21: Pt is complaint with HEP given in PT, initiating home exercises from surgeon this week.   06/29/21: compliant and independent with hom exercise program.    Time 3    Period Days    Status Achieved    Target Date 05/06/21      PT SHORT TERM GOAL #2   Title Patient will demonstrate modified independent gait with SPC for home-mobility distance (150 ft or greater) in clinic with no LOB and safe management of AD    Baseline 04/15/21: Patient using rollator as primary means of mobility.   05/19/21: Pt still using rollator as primary means of mobility; she has demonstrated gait in parallel bars with no AD with intermittent LOB requiring minA to maintain balance; will practice SPC ambulation next visit.   06/29/21: Patient ambulates with SPC with intermittent ModA required to prevent LOB for home mobility distance.    Time 4    Period Weeks    Status Partially Met    Target Date 05/13/21               PT Long Term Goals - 06/29/21 1445       PT LONG TERM GOAL #1   Title Pt will improve BERG by at least 3 points in order to demonstrate clinically significant improvement in balance.    Baseline 04/15/21: Baseline BERG to be obtained next visit.   04/19/21: 35/56.  05/18/21: 40/56   06/29/21: 38/56    Time 8    Period Weeks    Status Achieved    Target Date 05/18/21      PT LONG TERM GOAL #2   Title Pt will decrease 5TSTS by at least 3 seconds in order to demonstrate clinically significant improvement in LE strength.    Baseline 04/15/21: 5TSTS 31 sec.   05/19/21: Improved by 4 seconds.   06/29/21: 5TSTS improved by 8 seconds relative to initial eval (23 sec).    Time 8    Period Weeks    Status Achieved    Target Date 05/18/21      PT LONG TERM GOAL #3   Title Pt will decrease TUG to below 14 seconds/decrease in order to demonstrate decreased  fall risk.    Baseline 04/15/21: TUG 25 sec.   05/18/21: TUG 22.6 sec.   06/29/21:  TUG 20.1 sec    Time 8    Period Weeks    Status Not Met    Target Date 06/10/21      PT LONG TERM GOAL #4   Title Patient will demonstrate improved function as evidenced  by a score of 64 on FOTO measure for full participation in activities at home and in the community.    Baseline 04/15/21: FOTO 53.   05/18/21: FOTO 58.   06/29/21: FOTO 60.    Time 8    Period Weeks    Status Not Met    Target Date 06/10/21                   Plan - 07/07/21 1540     Clinical Impression Statement ?Pt has ongoing dizziness that is not explained by orthostatic hypotension given ongoing high SBP noted in clinic and continuous symptoms versus dizziness related to change in position. She demonstrates improving gait pattern with SPC, though pt intermittently exhibits cross-over gait pattern/ataxia and has 2 episodes of requiring ModA from PT to prevent significant loss of gait stability during ambulation trials. She has ongoing static postural control deficits and LE strength deficits requiring continued PT intervention. Patient has remaining deficits in LE strength, postural stability, gait changes, mild UE coordination deficits, dec cervical spine AROM (s/p fusion, full AROM not expected), impaired smooth pursuit (upward/downward) and saccades, impaired VOR cancellation, and difficulty with transferring and stair/obstacle negotiation.  Patient will benefit from continued skilled therapeutic intervention to address the above deficits as needed for improved function and QoL.    Personal Factors and Comorbidities Age;Comorbidity 3+    Comorbidities chronic kidney disease, Type II DM, peripheral neuropathy, hypertension, Hx of A-fib, cervical spine stenosis s/p C3-6 fusion    Examination-Activity Limitations Bed Mobility;Stairs;Stand;Locomotion Level;Transfers;Bend;Dressing    Examination-Participation Restrictions Community  Activity;Cleaning    Stability/Clinical Decision Making Evolving/Moderate complexity    Rehab Potential Good    PT Frequency 2x / week    PT Duration 6 weeks    PT Treatment/Interventions Gait training;Stair training;Functional mobility training;Therapeutic activities;Therapeutic exercise;Balance training;Neuromuscular re-education;Patient/family education;ADLs/Self Care Home Management;Cryotherapy;Electrical Stimulation;Moist Heat    PT Next Visit Plan Continue with gait and balance re-training, LE strengthening. UE coordination work in sitting/standing. Pt continuing with cervical AROM drills given by surgeon; may use MT for paracervical sensitivity and soft tissue mobility as needed. Recommend continued PT 2x/week for 6 weeks    PT Home Exercise Plan Access Code: [email or web address]    Consulted and Agree with Plan of Care Patient             Patient will benefit from skilled therapeutic intervention in order to improve the following deficits and impairments:  Abnormal gait, Postural dysfunction, Difficulty walking, Decreased balance, Decreased coordination, Decreased strength, Decreased activity tolerance  Visit Diagnosis: Difficulty in walking, not elsewhere classified  Imbalance  Muscle weakness (generalized)  S/P cervical spinal fusion     Problem List Patient Active Problem List   Diagnosis Date Noted   Cervical myelopathy ([place]) 03/24/2021   Anemia 12/03/2020   Essential hypertension 12/03/2020   Hypothyroidism 12/03/2020   Type 2 diabetes mellitus ([place]) 12/03/2020   Atrial fibrillation ([place]) 09/21/2018   Kidney transplant status 09/21/2018   Osteoporosis 09/21/2018   *Addendum for error in "Plan" section*  Valentina Gu, PT, DPT #[id number]  Eilleen Kempf, PT 07/07/2021, 3:45 PM  [place] [facility] [address] [place], [place], [address] Phone: [phone]   Fax:  [phone]  Name: Jean Johnson MRN:  [id number] Date of Birth: 16-Jul-1950

## 2021-07-09 ENCOUNTER — Ambulatory Visit: Payer: Medicare Other | Admitting: Physical Therapy

## 2021-07-09 NOTE — Patient Instructions (Incomplete)
°  TREATMENT   Neuromuscular Re-education - for static and dynamic postural stability as needed to improve balance with performance of ADLs and household/community mobility tasks, equilibrium and non-equilibrium coordination to improve motor control with standing activities     In // bars: Forward heel-to-toe walking and retro step; 3x D/Bx [verbal cueing and demo for arm swing and heel strike at initial contact] Standing saccades on Airex, horizontal and vertical; 2x20 each Standing VOR x 1 (slow cadence); x20 horiz and vert  Standing toe tap; 2x10 alternating, 6-inch step Standing on Airex, feet apart, eyes closed;  1x8 sec, 1x10sec, 1x15sec   *next visit* Hurdle step focusing on heel strike at initial contact, holding bilateral parallel bars 4x   *not today* Standing feet apart, eyes closed; 2x30 sec, bilat Forward and retro stepping; 5x D/B Standing Romberg; 3x30sec Sidestepping; 2x D/B length of parallel bars with Red Theraband above knees Standing on Airex with feet together; 2x30sec       Therapeutic Exercise - improved strength as needed to improve performance of CKC activities/functional movements and as needed for improved power production to prevent fall during episode of large postural perturbation     Ambulate laps around gym; x 3 laps with SPC RUE     *not today* In // bars: Standing heel raise/toe raise, alternating; 2x10  Seated hip abduction, black Tband; 2x10  Seated march; 2x10 [heaviness with lifting LLE] LAQ; 2x10; 5-lb cuff weight, bilateral LE Ambulate laps around gym; x 1 laps with rollator        ASSESSMENT Pt has ongoing dizziness that is not explained by orthostatic hypotension given ongoing high SBP noted in clinic and continuous symptoms versus dizziness related to change in position. She demonstrates improving gait pattern with SPC, though pt intermittently exhibits cross-over gait pattern/ataxia and has 2 episodes of requiring ModA from PT to  prevent significant loss of gait stability during ambulation trials. She has ongoing static postural control deficits and LE strength deficits requiring continued PT intervention. Patient has remaining deficits in LE strength, postural stability, gait changes, mild UE coordination deficits, dec cervical spine AROM (s/p fusion, full AROM not expected), impaired smooth pursuit (upward/downward) and saccades, impaired VOR cancellation, and difficulty with transferring and stair/obstacle negotiation.  Patient will benefit from continued skilled therapeutic intervention to address the above deficits as needed for improved function and QoL.

## 2021-07-13 ENCOUNTER — Other Ambulatory Visit: Payer: Self-pay

## 2021-07-13 ENCOUNTER — Ambulatory Visit: Payer: Medicare Other | Attending: Neurosurgery

## 2021-07-13 DIAGNOSIS — Z981 Arthrodesis status: Secondary | ICD-10-CM | POA: Diagnosis present

## 2021-07-13 DIAGNOSIS — R262 Difficulty in walking, not elsewhere classified: Secondary | ICD-10-CM | POA: Insufficient documentation

## 2021-07-13 DIAGNOSIS — R2689 Other abnormalities of gait and mobility: Secondary | ICD-10-CM | POA: Insufficient documentation

## 2021-07-13 DIAGNOSIS — R42 Dizziness and giddiness: Secondary | ICD-10-CM | POA: Diagnosis present

## 2021-07-13 DIAGNOSIS — M6281 Muscle weakness (generalized): Secondary | ICD-10-CM | POA: Diagnosis present

## 2021-07-13 NOTE — Therapy (Signed)
[facility] [address]. [place], [place], [address] Phone: [phone]   Fax:  [phone]  Physical Therapy Treatment  Patient Details  Name: Jean Johnson MRN: [id number] Date of Birth: February 26, 1951 Referring Provider (PT): Meade Maw, MD   Encounter Date: 07/13/2021   PT End of Session - 07/13/21 1617     Visit Number 18    Number of Visits 29    Date for PT Re-Evaluation 08/19/21    Authorization Type Medicare and [employer] - VL based on medical necessity, no auth required    Authorization Time Period initial eval 04/15/21, progress note 06/29/21; Cert 52/84/13-2/44/01    Progress Note Due on Visit --    PT Start Time 1425    PT Stop Time 1515    PT Time Calculation (min) 50 min    Equipment Utilized During Treatment --   rollator for negotiating clinic   Activity Tolerance Patient tolerated treatment well    Behavior During Therapy [facility] for tasks assessed/performed             Past Medical History:  Diagnosis Date   Anemia of chronic renal disease    Aortic stenosis, mild    a.) TTE on 11/23/2020 --> mean gradient 9.7 mmHg   Atrial fibrillation (HCC)    a.) CHA2DS2-VASc = 3 (age, sex, diabetes). b.) daily apixaban   B12 deficiency    Cervical spinal stenosis    Chronic anticoagulation    Apixaban   Diabetic neuropathy (HCC)    Diverticulosis    Dyspnea    ESRD (end stage renal disease) (HCC)    First degree AV block    HLD (hyperlipidemia)    Hypertension    Incomplete right bundle branch block (RBBB)    LAE (left atrial enlargement)    a.) TTE on 11/23/2020 --> moderate   Left thyroid nodule    a.) Cervical MRI on 12/29/2020 --> measured "at least" 3.5 cm; imcompletely imaged.   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   Valvular heart disease    a.) TTE 11/23/2020 --> mild panvalvular regurgitation    Past Surgical History:  Procedure Laterality Date   ANTERIOR CERVICAL DECOMP/DISCECTOMY FUSION N/A 03/24/2021   Procedure: C3-6 ANTERIOR CERVICAL DECOMPRESSION/DISCECTOMY FUSION 3 LEVELS;  Surgeon: Meade Maw, MD;  Location: [facility];  Service: Neurosurgery;  Laterality: N/A;   CATARACT EXTRACTION W/PHACO Left 10/21/2020   Procedure: CATARACT EXTRACTION PHACO AND INTRAOCULAR LENS PLACEMENT ([place]) DIABETES LEFT;  Surgeon: Leandrew Koyanagi, MD;  Location: [place];  Service: Ophthalmology;  Laterality: Left;  2.84 0:48.8 5.8%   EYE SURGERY Right 2020   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   Subjective Assessment - 07/13/21 1617     Subjective Ongoing dizziness    Pertinent History Patient is a 71 year old female previously seen for dizziness and imbalance, currently s/p C3-6 ACDF (DOS: 03/24/21). No significant complications  post-op. She reports some irritation recently along upper periscapular region. Patient is currently donning neck brace until next follow-up with her surgeon. She is refraining from cervical AROM per instructions from physician.  Patient reports using brace the entire day except for completing hygiene tasks, eating, and sleeping. Patient currently has precautions for lifting > one gallon per report; physician's notes state no lifting > 10 pounds in first 6 weeks. Pt is precautioned from bending and stooping at this time. Patient does have remaining neuropathic symptoms on RUE/RLE.  Patient reports no notable sciatic-type symptoms at this time. Patient reports no more urinary urgency/frequency than usual; she does occasionally feel hurried to get to the bathroom. Patient reports no fever, chills, night sweats. Pt has worked on gait with home health; she has walked as far 208 feet. She reports significant difficulty with stair negotiation at this time. Patient reports no stairs to enter/exit home. She has to negotiate 14 steps to get to her bedroom - handrail on L side. Pt has tub shower with shower bench; no grab bars. Pt has to negotiate gravel to get into her home. Pt reports having intermittent slight dizziness, but "not that much" recently.    Limitations Walking;House hold activities;Standing    Diagnostic tests Intraoperative fluoroscopy - no post-op imaging yet    Patient Stated Goals Able to walk with improved steadiness                VESTIBULAR AND BALANCE EVALUATION   HISTORY:  Subjective history of current problem: Pt arrives reporting continued constant dizziness. She has had two distinct episodes of vertigo in the past and both episodes lasted for around 1 weeks with gradual improvement after the first 3-4 days. She still has regular, if not continuous, epsidoes of "dizziness." She describes the symptoms as if "my eyes are rolling." Her symptoms improve if she lays her head back and never occur when she is laying down. They rarely occur in sitting and are mostly noted when standing and walking. She feels like she stumbles often especially in the evening after watching television. These symptoms have been ongoing for "a couple years" without any notable improvement. She reports a history of neuropathy which is worse on the right side. History of R hand numbness s/p carpal tunnel surgery. She has an appointment tomorrow to see Dr. Manuella Ghazi and would like for him to order a brain MRI.  Description of dizziness: "Things look like they are moving" Denies  lightheadedness/faintness or any true vertigo (vertigo, unsteadiness, lightheadedness, falling, general unsteadiness, whoozy, swimmy-headed sensation, aural fullness) Frequency: Every day Duration: "all day long" Symptom nature: (motion provoked, positional, spontaneous, constant, variable, intermittent)   Provocative Factors: Almost all of her symptoms occur when standing or walking but otherwise unable to identify provocative factors; Easing Factors: Pt unsure but possibly worsened if bending forward. Never occurs when laying down, infrequently in sitting.  Progression of symptoms: unchanged (better, worse, no change since onset) History of similar episodes: 2 distinct episodes of vertigo which lasted approximately 1 week each but no similar episodes to what she is currently experiencing  Falls (yes/no): Yes Number of falls in past 6 months: 1   Prior Functional Level: Independent with ADLs, husband assists with IADLs.  Auditory complaints (tinnitus, pain, drainage, hearing loss, aural fullness): None Vision (diplopia, visual field loss, recent changes, last eye exam): Infrequent blurring of vision (has seen optometrist in the last year with small change in the prescription). Pt has had cataract surgery on her  R eye in the past; Headaches: once every few months, no prior or current history of migraines; Chest pain: None, recently seen by cardiology who doesn't believe her symptoms are related to her atrial fibrillation Stress/anxiety: "a little bit of stress," however pt mostly denies significant stressors;   Red Flags: Reports difficulty swallowing large pills but no cough/difficulty swallowing with normal meals (dysarthria, drop attacks, recent bowel and bladder changes, recent weight loss/gain) Review of systems negative for red flags.     EXAMINATION  POSTURE: Forward head and rounded shoulders without upper thoracic kyphosis  NEUROLOGICAL SCREEN: (2+ unless otherwise noted.)  N=normal  Ab=abnormal  Level Dermatome R L Myotome R L Reflex R L  C3 Anterior Neck Ab N Sidebend C2-3 N N Jaw CN V    C4 Top of Shoulder Ab N Shoulder Shrug C4 N N Hoffmans UMN Ab Ab  C5 Lateral Upper Arm Ab N Shoulder ABD C4-5 N N Biceps C5-6    C6 Lateral Arm/ Thumb Ab N Arm Flex/ Wrist Ext C5-6 N N Brachiorad. C5-6    C7 Middle Finger Ab N Arm Ext//Wrist Flex C6-7 N N Triceps C7    C8 4th & 5th Finger Ab N Flex/ Ext Carpi Ulnaris C8 N N Patellar (L3-4)    T1 Medial Arm Ab N Interossei T1 N N Gastrocnemius    L2 Medial thigh/groin N N Illiopsoas (L2-3) N N     L3 Lower thigh/med.knee N N Quadriceps (L3-4) N N     L4 Medial leg/lat thigh N N Tibialis Ant (L4-5) N Ab     L5 Lat. leg & dorsal foot N N EHL (L5) NT NT     S1 post/lat foot/thigh/leg N N Gastrocnemius (S1-2) N N     S2 Post./med. thigh & leg N N Hamstrings (L4-S3) N N       Cranial Nerves Pt appears to have deficits in her vertical visual field in the upper right and left corners Extraocular muscles are grossly intact however vertical ocular range of motion does appears mildly/moderately limited; Facial sensation is intact bilaterally  Facial strength is intact bilaterally  Hearing is normal as tested by gross conversation Difficulty visualizing palate however normal phonation  Shoulder shrug strength is intact  Tongue protrudes slightly to the right;   COORDINATION: Finger to Nose: Normal Heel to Shin: Not tested Pronator Drift: Mild L pronator drift Rapid Alternating Movements: Normal Finger to Thumb Opposition: Normal  MUSCULOSKELETAL SCREEN: Cervical Spine ROM: Moderate/severe limitation in all directions with mild end range pain reported   ROM: UE/LE grossly WNL  MMT: Pt appears to have slight weakness in L knee flexion as well as L ankle plantarflexion when compared to R side. No focal weakness identified in BUE however gross deconditioning noted throughout;  Functional Mobility: Modified independent  with transfers and ambulation with use of rollator. Self-selected gait speed diminished   POSTURAL CONTROL TESTS:   Clinical Test of Sensory Interaction for Balance    (CTSIB): Deferred full testing however Romberg is positive for gross loss of balance within 3-4 seconds   OCULOMOTOR / VESTIBULAR TESTING:  Oculomotor Exam- Room Light  Findings Comments  Ocular Alignment normal   Ocular ROM abnormal Decreased vertical range of motion noted  Spontaneous Nystagmus normal   Gaze-Holding Nystagmus normal   End-Gaze Nystagmus normal   Vergence (normal 2-3") not examined   Smooth Pursuit abnormal Saccadic  Cross-Cover Test not examined   Saccades normal   VOR Cancellation normal  Left Head Impulse normal Difficult to assess due to recent ACDF  Right Head Impulse normal See above  Static Acuity not examined   Dynamic Acuity not examined     Oculomotor Exam- Fixation Suppressed (overall difficult to assess due to hooded eyelids and pt having difficulty keeping eyes open wide enough for therapist to visualize)  Findings Comments  Ocular Alignment normal   Spontaneous Nystagmus normal   Gaze-Holding Nystagmus normal   End-Gaze Nystagmus normal   Head Shaking Nystagmus normal   Pressure-Induced Nystagmus not examined   Hyperventilation Induced Nystagmus not examined   Skull Vibration Induced Nystagmus not examined     BPPV TESTS:  Symptoms Duration Intensity Nystagmus  L Dix-Hallpike None   None  R Dix-Hallpike None   None  L Head Roll None   None  R Head Roll None   None  L Sidelying Test      R Sidelying Test                PT Short Term Goals - 06/29/21 1554       PT SHORT TERM GOAL #1   Title Pt will be independent with HEP in order to improve strength and balance in order to decrease fall risk and improve function at home and work.    Baseline 04/15/21: HEP initiated.   05/19/21: Pt is complaint with HEP given in PT, initiating home exercises from surgeon this  week.   06/29/21: compliant and independent with hom exercise program.    Time 3    Period Days    Status Achieved    Target Date 05/06/21      PT SHORT TERM GOAL #2   Title Patient will demonstrate modified independent gait with SPC for home-mobility distance (150 ft or greater) in clinic with no LOB and safe management of AD    Baseline 04/15/21: Patient using rollator as primary means of mobility.   05/19/21: Pt still using rollator as primary means of mobility; she has demonstrated gait in parallel bars with no AD with intermittent LOB requiring minA to maintain balance; will practice SPC ambulation next visit.   06/29/21: Patient ambulates with SPC with intermittent ModA required to prevent LOB for home mobility distance.    Time 4    Period Weeks    Status Partially Met    Target Date 05/13/21               PT Long Term Goals - 06/29/21 1445       PT LONG TERM GOAL #1   Title Pt will improve BERG by at least 3 points in order to demonstrate clinically significant improvement in balance.    Baseline 04/15/21: Baseline BERG to be obtained next visit.   04/19/21: 35/56.  05/18/21: 40/56   06/29/21: 38/56    Time 8    Period Weeks    Status Achieved    Target Date 05/18/21      PT LONG TERM GOAL #2   Title Pt will decrease 5TSTS by at least 3 seconds in order to demonstrate clinically significant improvement in LE strength.    Baseline 04/15/21: 5TSTS 31 sec.   05/19/21: Improved by 4 seconds.   06/29/21: 5TSTS improved by 8 seconds relative to initial eval (23 sec).    Time 8    Period Weeks    Status Achieved    Target Date 05/18/21      PT LONG TERM GOAL #3   Title Pt will  decrease TUG to below 14 seconds/decrease in order to demonstrate decreased fall risk.    Baseline 04/15/21: TUG 25 sec.   05/18/21: TUG 22.6 sec.   06/29/21:  TUG 20.1 sec    Time 8    Period Weeks    Status Not Met    Target Date 06/10/21      PT LONG TERM GOAL #4   Title Patient will demonstrate  improved function as evidenced by a score of 64 on FOTO measure for full participation in activities at home and in the community.    Baseline 04/15/21: FOTO 53.   05/18/21: FOTO 58.   06/29/21: FOTO 60.    Time 8    Period Weeks    Status Not Met    Target Date 06/10/21                   Plan - 07/13/21 1622     Clinical Impression Statement Pt seen by therapist for vestibular screening. Testing is somewhat limited due to recent cervical surgery with guarded/limited neck range of motion. Fixation suppression testing is also difficult to assess due to hooded eyelids and pt having difficulty keeping eyes open wide enough for therapist to visualize eye movements.  Overall examination and history is more consistent with central etiology of symptoms.  Patient reports persistent symptoms which last all day long.  She appears to have decreased vertical ocular range of motion in the upper corners bilaterally as well as diminished vertical visual fields when tested.  Positive left pronator drift as well as decreased sensation in entire right upper quarter.  Smooth pursuit visual tracking is saccadic. Notably positive Romberg testing in pt with self-reported diabetic neuropathy. She has an upcoming appointment with neurology and if deemed appropriate brain MRI may prove useful in further possible etiology of symptoms.  Pt presents with deficits in strength, gait, balance, and dizziness. She will benefit from skilled PT services to address these deficits to improve function and decrease risk for future falls.    Personal Factors and Comorbidities Age;Comorbidity 3+    Comorbidities chronic kidney disease, Type II DM, peripheral neuropathy, hypertension, Hx of A-fib, cervical spine stenosis s/p C3-6 fusion    Examination-Activity Limitations Bed Mobility;Stairs;Stand;Locomotion Level;Transfers;Bend;Dressing    Examination-Participation Restrictions Community Activity;Cleaning    Stability/Clinical  Decision Making Evolving/Moderate complexity    Rehab Potential Good    PT Frequency 2x / week    PT Duration 6 weeks    PT Treatment/Interventions Gait training;Stair training;Functional mobility training;Therapeutic activities;Therapeutic exercise;Balance training;Neuromuscular re-education;Patient/family education;ADLs/Self Care Home Management;Cryotherapy;Electrical Stimulation;Moist Heat    PT Next Visit Plan Continue with gait and balance re-training, LE strengthening. UE coordination work in sitting/standing. Pt continuing with cervical AROM drills given by surgeon; may use MT for paracervical sensitivity and soft tissue mobility as needed. Recommend continued PT 2x/week for 6 weeks    PT Home Exercise Plan Access Code: [email or web address]    Consulted and Agree with Plan of Care Patient             Patient will benefit from skilled therapeutic intervention in order to improve the following deficits and impairments:  Abnormal gait, Postural dysfunction, Difficulty walking, Decreased balance, Decreased coordination, Decreased strength, Decreased activity tolerance  Visit Diagnosis: Difficulty in walking, not elsewhere classified  Dizziness and giddiness     Problem List Patient Active Problem List   Diagnosis Date Noted   Cervical myelopathy ([place]) 03/24/2021   Anemia 12/03/2020   Essential hypertension 12/03/2020  Hypothyroidism 12/03/2020   Type 2 diabetes mellitus ([place]) 12/03/2020   Atrial fibrillation ([place]) 09/21/2018   Kidney transplant status 09/21/2018   Osteoporosis 09/21/2018    Naithan Delage, PT 07/13/2021, 4:40 PM  [facility] [address] [place], [place], [address] Phone: [phone]   Fax:  [phone]  Name: Riannah Stagner MRN: [id number] Date of Birth: [birth year]

## 2021-07-15 ENCOUNTER — Ambulatory Visit: Payer: Medicare Other | Admitting: Physical Therapy

## 2021-07-15 ENCOUNTER — Other Ambulatory Visit: Payer: Self-pay

## 2021-07-15 ENCOUNTER — Other Ambulatory Visit: Payer: Self-pay | Admitting: Neurology

## 2021-07-15 DIAGNOSIS — R42 Dizziness and giddiness: Secondary | ICD-10-CM

## 2021-07-15 DIAGNOSIS — Z981 Arthrodesis status: Secondary | ICD-10-CM

## 2021-07-15 DIAGNOSIS — M6281 Muscle weakness (generalized): Secondary | ICD-10-CM

## 2021-07-15 DIAGNOSIS — R262 Difficulty in walking, not elsewhere classified: Secondary | ICD-10-CM | POA: Diagnosis not present

## 2021-07-15 DIAGNOSIS — R2689 Other abnormalities of gait and mobility: Secondary | ICD-10-CM

## 2021-07-15 NOTE — Therapy (Signed)
[facility] [address] [place], [place], [address] Phone: [phone]   Fax:  [phone]  Physical Therapy Treatment  Patient Details  Name: Jean Johnson MRN: [id number] Date of Birth: Sep 04, 1950 Referring Provider (PT): Meade Maw, MD   Encounter Date: 07/15/2021   PT End of Session - 07/15/21 1458     Visit Number 19    Number of Visits 29    Date for PT Re-Evaluation 08/19/21    Authorization Type Medicare and [employer] - VL based on medical necessity, no auth required    Authorization Time Period initial eval 04/15/21, progress note 06/29/21; Cert 77/93/90-3/00/92    PT Start Time 1435    PT Stop Time 1515    PT Time Calculation (min) 40 min    Equipment Utilized During Treatment --   rollator for negotiating clinic   Activity Tolerance Patient tolerated treatment well    Behavior During Therapy WFL for tasks assessed/performed             Past Medical History:  Diagnosis Date   Anemia of chronic renal disease    Aortic stenosis, mild    a.) TTE on 11/23/2020 --> mean gradient 9.7 mmHg   Atrial fibrillation ([place])    a.) CHA2DS2-VASc = 3 (age, sex, diabetes). b.) daily apixaban   B12 deficiency    Cervical spinal stenosis    Chronic anticoagulation    Apixaban   Diabetic neuropathy (HCC)    Diverticulosis    Dyspnea    ESRD (end stage renal disease) (HCC)    First degree AV block    HLD (hyperlipidemia)    Hypertension    Incomplete right bundle branch block (RBBB)    LAE (left atrial enlargement)    a.) TTE on 11/23/2020 --> moderate   Left thyroid nodule    a.) Cervical MRI on 12/29/2020 --> measured "at least" 3.5 cm; imcompletely imaged.   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   Valvular heart disease    a.) TTE 11/23/2020 --> mild panvalvular regurgitation    Past Surgical History:  Procedure Laterality Date   ANTERIOR CERVICAL DECOMP/DISCECTOMY FUSION N/A 03/24/2021   Procedure: C3-6 ANTERIOR CERVICAL DECOMPRESSION/DISCECTOMY FUSION 3 LEVELS;  Surgeon: Meade Maw, MD;  Location: [facility];  Service: Neurosurgery;  Laterality: N/A;   CATARACT EXTRACTION W/PHACO Left 10/21/2020   Procedure: CATARACT EXTRACTION PHACO AND INTRAOCULAR LENS PLACEMENT ([place]) DIABETES LEFT;  Surgeon: Leandrew Koyanagi, MD;  Location: [place];  Service: Ophthalmology;  Laterality: Left;  2.84 0:48.8 5.8%   EYE SURGERY Right 2020   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   Subjective Assessment - 07/15/21 1437     Subjective Patient reports ongoing continuous dizziness for which she is following up with neurology; pt has brain MRI ordered per Dr. Manuella Ghazi. Patient reports still needing continued work on her steadiness/balance.    Pertinent History Patient  is a 71 year old female previously seen for dizziness and imbalance, currently s/p C3-6 ACDF (DOS: 03/24/21). No significant complications post-op. She reports some irritation recently along upper periscapular region. Patient is currently donning neck brace until next follow-up with her surgeon. She is refraining from cervical AROM per instructions from physician.  Patient reports using brace the entire day except for completing hygiene tasks, eating, and sleeping. Patient currently has precautions for lifting > one gallon per report; physician's notes state no lifting > 10  pounds in first 6 weeks. Pt is precautioned from bending and stooping at this time. Patient does have remaining neuropathic symptoms on RUE/RLE. Patient reports no notable sciatic-type symptoms at this time. Patient reports no more urinary urgency/frequency than usual; she does occasionally feel hurried to get to the bathroom. Patient reports no fever, chills, night sweats. Pt has worked on gait with home health; she has walked as far 208 feet. She reports significant difficulty with stair negotiation at this time. Patient reports no stairs to enter/exit home. She has to negotiate 14 steps to get to her bedroom - handrail on L side. Pt has tub shower with shower bench; no grab bars. Pt has to negotiate gravel to get into her home. Pt reports having intermittent slight dizziness, but "not that much" recently.    Limitations Walking;House hold activities;Standing    Diagnostic tests Intraoperative fluoroscopy - no post-op imaging yet    Patient Stated Goals Able to walk with improved steadiness                TREATMENT   Neuromuscular Re-education - for static and dynamic postural stability as needed to improve balance with performance of ADLs and household/community mobility tasks, equilibrium and non-equilibrium coordination to improve motor control with standing activities     In // bars: -Forward heel-to-toe walking and retro step; 3x D/Bx [verbal cueing and demo for arm swing and heel strike at initial contact] -Standing VOR x 1 (slow cadence); x20 horiz and vert, feet apart on Airex  -Forward stepping along parallel bars with yellow band around lower limbs to resist adduction; 3x D/B -Standing on Airex, feet apart, eyes closed; *unable to perform today -*Unable to maintain feet together/standing Romberg today -Standing feet apart, eyes closed; 2x30 sec, bilat -Hurdle step focusing on heel strike at initial contact, holding bilateral parallel bars 4x   *not today* -Standing toe tap;  2x10 alternating, 6-inch step Standing saccades on Airex, horizontal and vertical; 2x20 each Forward and retro stepping; 5x D/B Standing Romberg; 3x30sec Sidestepping; 2x D/B length of parallel bars with Red Theraband above knees Standing on Airex with feet together; 2x30sec       Therapeutic Exercise - improved strength as needed to improve performance of CKC activities/functional movements and as needed for improved power production to prevent fall during episode of large postural perturbation     Ambulate laps around gym; x 2 laps with SPC RUE   Sit to stand with 4-lb Goblet hold, from chair in gym; 2x8    *not today* In // bars: Standing heel raise/toe raise, alternating; 2x10  Seated hip abduction, black Tband; 2x10  Seated march; 2x10 [heaviness with lifting LLE] LAQ; 2x10; 5-lb cuff weight, bilateral LE Ambulate laps around gym; x 1 laps with rollator        ASSESSMENT Pt Is undergoing further diagnostic workup with neurology given persisting gait ataxia, unsteadiness, and continuous dizziness with sensation of eye and environmental spinning/movement. Pt is awaiting scheduling for brain MRI. Pt  underwent further assessment with vestibular PT last session with exam consistent with central etiology of symptoms versus BPPV or peripheral vestibular hypofunction. Pt presents with ongoing gait instability and has significant difficulty with Romberg standing. Pt is able to perform VOR x 1 on Airex with feet apart, but unexpectedly has notable impairment in postural control with trying to maintain static stance on Airex with feet together or feet apart afterward. Patient has remaining deficits in LE strength, postural stability, gait changes, mild UE coordination deficits, dec cervical spine AROM (s/p fusion, full AROM not expected), impaired smooth pursuit (upward/downward) and saccades, impaired VOR cancellation, and difficulty with transferring and stair/obstacle negotiation.  Patient  will benefit from continued skilled therapeutic intervention to address the above deficits as needed for improved function and QoL.       PT Short Term Goals - 06/29/21 1554       PT SHORT TERM GOAL #1   Title Pt will be independent with HEP in order to improve strength and balance in order to decrease fall risk and improve function at home and work.    Baseline 04/15/21: HEP initiated.   05/19/21: Pt is complaint with HEP given in PT, initiating home exercises from surgeon this week.   06/29/21: compliant and independent with hom exercise program.    Time 3    Period Days    Status Achieved    Target Date 05/06/21      PT SHORT TERM GOAL #2   Title Patient will demonstrate modified independent gait with SPC for home-mobility distance (150 ft or greater) in clinic with no LOB and safe management of AD    Baseline 04/15/21: Patient using rollator as primary means of mobility.   05/19/21: Pt still using rollator as primary means of mobility; she has demonstrated gait in parallel bars with no AD with intermittent LOB requiring minA to maintain balance; will practice SPC ambulation next visit.   06/29/21: Patient ambulates with SPC with intermittent ModA required to prevent LOB for home mobility distance.    Time 4    Period Weeks    Status Partially Met    Target Date 05/13/21               PT Long Term Goals - 06/29/21 1445       PT LONG TERM GOAL #1   Title Pt will improve BERG by at least 3 points in order to demonstrate clinically significant improvement in balance.    Baseline 04/15/21: Baseline BERG to be obtained next visit.   04/19/21: 35/56.  05/18/21: 40/56   06/29/21: 38/56    Time 8    Period Weeks    Status Achieved    Target Date 05/18/21      PT LONG TERM GOAL #2   Title Pt will decrease 5TSTS by at least 3 seconds in order to demonstrate clinically significant improvement in LE strength.    Baseline 04/15/21: 5TSTS 31 sec.   05/19/21: Improved by 4 seconds.   06/29/21:  5TSTS improved by 8 seconds relative to initial eval (23 sec).    Time 8    Period Weeks    Status Achieved    Target Date 05/18/21      PT LONG TERM GOAL #3   Title Pt will decrease TUG to below 14 seconds/decrease in order to demonstrate decreased fall risk.    Baseline 04/15/21: TUG 25 sec.   05/18/21: TUG 22.6 sec.   06/29/21:  TUG 20.1 sec  Time 8    Period Weeks    Status Not Met    Target Date 06/10/21      PT LONG TERM GOAL #4   Title Patient will demonstrate improved function as evidenced by a score of 64 on FOTO measure for full participation in activities at home and in the community.    Baseline 04/15/21: FOTO 53.   05/18/21: FOTO 58.   06/29/21: FOTO 60.    Time 8    Period Weeks    Status Not Met    Target Date 06/10/21                   Plan - 07/15/21 1627     Clinical Impression Statement Pt Is undergoing further diagnostic workup with neurology given persisting gait ataxia, unsteadiness, and continuous dizziness with sensation of eye and environmental spinning/movement. Pt is awaiting scheduling for brain MRI. Pt underwent further assessment with vestibular PT last session with exam consistent with central etiology of symptoms versus BPPV or peripheral vestibular hypofunction. Pt presents with ongoing gait instability and has significant difficulty with Romberg standing. Pt is able to perform VOR x 1 on Airex with feet apart, but unexpectedly has notable impairment in postural control with trying to maintain static stance on Airex with feet together or feet apart afterward. Patient has remaining deficits in LE strength, postural stability, gait changes, mild UE coordination deficits, dec cervical spine AROM (s/p fusion, full AROM not expected), impaired smooth pursuit (upward/downward) and saccades, impaired VOR cancellation, and difficulty with transferring and stair/obstacle negotiation.  Patient will benefit from continued skilled therapeutic intervention to  address the above deficits as needed for improved function and QoL.    Personal Factors and Comorbidities Age;Comorbidity 3+    Comorbidities chronic kidney disease, Type II DM, peripheral neuropathy, hypertension, Hx of A-fib, cervical spine stenosis s/p C3-6 fusion    Examination-Activity Limitations Bed Mobility;Stairs;Stand;Locomotion Level;Transfers;Bend;Dressing    Examination-Participation Restrictions Community Activity;Cleaning    Stability/Clinical Decision Making Evolving/Moderate complexity    Rehab Potential Good    PT Frequency 2x / week    PT Duration 6 weeks    PT Treatment/Interventions Gait training;Stair training;Functional mobility training;Therapeutic activities;Therapeutic exercise;Balance training;Neuromuscular re-education;Patient/family education;ADLs/Self Care Home Management;Cryotherapy;Electrical Stimulation;Moist Heat    PT Next Visit Plan Continue with gait and balance re-training, LE strengthening. UE coordination work in sitting/standing. Pt continuing with cervical AROM drills given by surgeon; may use MT for paracervical sensitivity and soft tissue mobility as needed. Recommend continued PT 2x/week for 6 weeks    PT Home Exercise Plan Access Code: [email or web address]    Consulted and Agree with Plan of Care Patient             Patient will benefit from skilled therapeutic intervention in order to improve the following deficits and impairments:  Abnormal gait, Postural dysfunction, Difficulty walking, Decreased balance, Decreased coordination, Decreased strength, Decreased activity tolerance  Visit Diagnosis: No diagnosis found.     Problem List Patient Active Problem List   Diagnosis Date Noted   Cervical myelopathy ([place]) 03/24/2021   Anemia 12/03/2020   Essential hypertension 12/03/2020   Hypothyroidism 12/03/2020   Type 2 diabetes mellitus ([place]) 12/03/2020   Atrial fibrillation ([place]) 09/21/2018   Kidney transplant status 09/21/2018   Osteoporosis  09/21/2018   Valentina Gu, PT, DPT #[id number]  Eilleen Kempf, PT 07/15/2021, 4:27 PM  [facility] [address] [place], [place], [address] Phone: [phone]   Fax:  [phone]  Name: Jean Johnson MRN: [id number] Date of Birth: Feb 09, 1951

## 2021-07-20 ENCOUNTER — Other Ambulatory Visit: Payer: Self-pay

## 2021-07-20 ENCOUNTER — Ambulatory Visit: Payer: Medicare Other | Admitting: Physical Therapy

## 2021-07-20 DIAGNOSIS — R42 Dizziness and giddiness: Secondary | ICD-10-CM

## 2021-07-20 DIAGNOSIS — R262 Difficulty in walking, not elsewhere classified: Secondary | ICD-10-CM

## 2021-07-20 DIAGNOSIS — R2689 Other abnormalities of gait and mobility: Secondary | ICD-10-CM

## 2021-07-20 DIAGNOSIS — Z981 Arthrodesis status: Secondary | ICD-10-CM

## 2021-07-20 DIAGNOSIS — M6281 Muscle weakness (generalized): Secondary | ICD-10-CM

## 2021-07-20 NOTE — Therapy (Signed)
[facility] [address]. [place], [place], [address] Phone: [phone]   Fax:  [phone]  Physical Therapy Treatment  Patient Details  Name: Jean Johnson MRN: [id number] Date of Birth: [birth year] Referring Provider (PT): Meade Maw, MD   Encounter Date: 07/20/2021   PT End of Session - 07/21/21 1053     Visit Number 20    Number of Visits 29    Date for PT Re-Evaluation 08/19/21    Authorization Type Medicare and [employer] - VL based on medical necessity, no auth required    Authorization Time Period initial eval 04/15/21, progress note 06/29/21; Cert 69/62/95-2/84/13    PT Start Time 1426    PT Stop Time 1513    PT Time Calculation (min) 47 min    Equipment Utilized During Treatment --   rollator for negotiating clinic   Activity Tolerance Patient tolerated treatment well    Behavior During Therapy [facility] for tasks assessed/performed             Past Medical History:  Diagnosis Date   Anemia of chronic renal disease    Aortic stenosis, mild    a.) TTE on 11/23/2020 --> mean gradient 9.7 mmHg   Atrial fibrillation (HCC)    a.) CHA2DS2-VASc = 3 (age, sex, diabetes). b.) daily apixaban   B12 deficiency    Cervical spinal stenosis    Chronic anticoagulation    Apixaban   Diabetic neuropathy (HCC)    Diverticulosis    Dyspnea    ESRD (end stage renal disease) (HCC)    First degree AV block    HLD (hyperlipidemia)    Hypertension    Incomplete right bundle branch block (RBBB)    LAE (left atrial enlargement)    a.) TTE on 11/23/2020 --> moderate   Left thyroid nodule    a.) Cervical MRI on 12/29/2020 --> measured "at least" 3.5 cm; imcompletely imaged.   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   Valvular heart disease    a.) TTE 11/23/2020 --> mild panvalvular regurgitation    Past Surgical History:  Procedure Laterality Date   ANTERIOR CERVICAL DECOMP/DISCECTOMY FUSION N/A 03/24/2021   Procedure: C3-6 ANTERIOR CERVICAL DECOMPRESSION/DISCECTOMY FUSION 3 LEVELS;  Surgeon: Meade Maw, MD;  Location: [facility];  Service: Neurosurgery;  Laterality: N/A;   CATARACT EXTRACTION W/PHACO Left 10/21/2020   Procedure: CATARACT EXTRACTION PHACO AND INTRAOCULAR LENS PLACEMENT ([place]) DIABETES LEFT;  Surgeon: Leandrew Koyanagi, MD;  Location: [place];  Service: Ophthalmology;  Laterality: Left;  2.84 0:48.8 5.8%   EYE SURGERY Right 2020   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   Subjective Assessment - 07/20/21 1435     Subjective Patient reports that at night she sometimes does not have dizziness. She reports that she has dizziness for brief time in the morning, but this impoves as the morning continues. She reports continuous dizziness at arrival  to PT that is mild in intensity. Patient reports no recent falls or major concerns at home. She reports having to perfom transfers slowly due to concern for getting dizzy. She has brain MRI scheduled for this Saturday.    Pertinent History Patient is a 71 year old female previously seen for dizziness and imbalance, currently s/p C3-6 ACDF (DOS: 03/24/21). No significant complications post-op. She reports some irritation recently along upper periscapular region. Patient is currently donning neck brace until next follow-up with her surgeon. She is refraining from cervical  AROM per instructions from physician.  Patient reports using brace the entire day except for completing hygiene tasks, eating, and sleeping. Patient currently has precautions for lifting > one gallon per report; physician's notes state no lifting > 10 pounds in first 6 weeks. Pt is precautioned from bending and stooping at this time. Patient does have remaining neuropathic symptoms on RUE/RLE. Patient reports no notable sciatic-type symptoms at this time. Patient reports no more urinary urgency/frequency than usual; she does occasionally feel hurried to get to the bathroom. Patient reports no fever, chills, night sweats. Pt has worked on gait with home health; she has walked as far 208 feet. She reports significant difficulty with stair negotiation at this time. Patient reports no stairs to enter/exit home. She has to negotiate 14 steps to get to her bedroom - handrail on L side. Pt has tub shower with shower bench; no grab bars. Pt has to negotiate gravel to get into her home. Pt reports having intermittent slight dizziness, but "not that much" recently.    Limitations Walking;House hold activities;Standing    Diagnostic tests Intraoperative fluoroscopy - no post-op imaging yet    Patient Stated Goals Able to walk with improved steadiness               TREATMENT   Neuromuscular Re-education - for static and dynamic postural stability as needed to improve balance with performance of ADLs and household/community mobility tasks, equilibrium and non-equilibrium coordination to improve motor control with standing activities     In // bars: -Forward heel-to-toe walking and retro step; 3x D/Bx [verbal cueing and demo for arm swing and heel strike at initial contact] -Standing VOR x 1 (slow cadence); 2x10; horiz and vert, feet apart on Airex  -Standing saccades on Airex, horizontal and vertical; 2x20 each -Standing feet apart, eyes closed; 1x30 sec, bilat -Standing narrow BOS, eyes closed; 2x20 sec (  3 cm apart)   *next visit* Forward stepping along parallel bars with yellow band around lower limbs to resist adduction; 3x D/B   *not today* -Hurdle step focusing on heel strike at initial contact, holding bilateral parallel bars 4x -Standing on Airex, feet apart, eyes closed; -*Unable to maintain feet together/standing Romberg today Standing toe tap; 2x10 alternating, 6-inch step Standing Romberg; 3x30sec Sidestepping; 2x D/B length of parallel bars with Red Theraband above knees Standing on Airex with feet together; 2x30sec       Therapeutic Exercise - improved strength as needed to improve performance of CKC activities/functional movements and as needed for improved power production to prevent fall during episode of large postural perturbation     Ambulate laps around gym; x 2 laps with SPC RUE   *next visit* Sit to stand with 4-lb Goblet hold, from chair in gym; 2x8     *not today* In // bars: Standing heel raise/toe raise, alternating; 2x10  Seated hip abduction, black Tband; 2x10  Seated march; 2x10 [heaviness with lifting LLE] LAQ; 2x10; 5-lb cuff  weight, bilateral LE Ambulate laps around gym; x 1 laps with rollator      MHP (unbilled) utilized pre-treatment for analgesic effect and improved soft tissue extensibility and to improve participation in active PT intervention, along low back in sitting; x 5 minutes       ASSESSMENT Pt exhibits improved postural control compared to last visit as exhibited by performance of static standing drills on compliant surface (Airex pad). She does have continuous dizziness at this time, but this is mild today per report. Patient is awaiting brain MRI to investigate dizziness of unknown cause. She exhibits improving gait quality with decreased excess toe-out and LE external rotation; however, she does still have intermittent LOB, lateral staggering, and ataxia limiting safety with SPC. Pt is most safe with use of rollator at this time.  Patient has remaining deficits in LE strength, postural stability, gait changes, mild UE coordination deficits, dec cervical spine AROM (s/p fusion, full AROM not expected), impaired smooth pursuit (upward/downward) and saccades, impaired VOR cancellation, and difficulty with transferring and stair/obstacle negotiation.  Patient will benefit from continued skilled therapeutic intervention to address the above deficits as needed for improved function and QoL.        PT Short Term Goals - 06/29/21 1554       PT SHORT TERM GOAL #1   Title Pt will be independent with HEP in order to improve strength and balance in order to decrease fall risk and improve function at home and work.    Baseline 04/15/21: HEP initiated.   05/19/21: Pt is complaint with HEP given in PT, initiating home exercises from surgeon this week.   06/29/21: compliant and independent with hom exercise program.    Time 3    Period Days    Status Achieved    Target Date 05/06/21      PT SHORT TERM GOAL #2   Title Patient will demonstrate modified independent gait with SPC for home-mobility distance (150 ft or greater) in clinic with no LOB and safe management of AD    Baseline 04/15/21: Patient using rollator as primary means of mobility.   05/19/21: Pt still using rollator as primary means of mobility; she has demonstrated gait in parallel bars with no AD with intermittent LOB requiring minA to maintain balance; will practice SPC ambulation next visit.   06/29/21: Patient ambulates with SPC with intermittent ModA required to prevent LOB for home mobility distance.    Time 4    Period Weeks    Status Partially Met    Target Date 05/13/21               PT Long Term Goals - 06/29/21 1445       PT LONG TERM GOAL #1   Title Pt will improve BERG by at least 3 points in order to demonstrate clinically significant improvement in balance.    Baseline 04/15/21: Baseline BERG to be obtained next visit.   04/19/21: 35/56.  05/18/21:  40/56   06/29/21: 38/56    Time 8    Period Weeks    Status Achieved    Target Date 05/18/21      PT LONG TERM GOAL #2   Title Pt will decrease 5TSTS by at least 3 seconds in order to demonstrate clinically significant improvement in LE strength.    Baseline 04/15/21: 5TSTS 31 sec.   05/19/21: Improved by 4 seconds.   06/29/21: 5TSTS improved by 8 seconds relative to initial eval (23 sec).  Time 8    Period Weeks    Status Achieved    Target Date 05/18/21      PT LONG TERM GOAL #3   Title Pt will decrease TUG to below 14 seconds/decrease in order to demonstrate decreased fall risk.    Baseline 04/15/21: TUG 25 sec.   05/18/21: TUG 22.6 sec.   06/29/21:  TUG 20.1 sec    Time 8    Period Weeks    Status Not Met    Target Date 06/10/21      PT LONG TERM GOAL #4   Title Patient will demonstrate improved function as evidenced by a score of 64 on FOTO measure for full participation in activities at home and in the community.    Baseline 04/15/21: FOTO 53.   05/18/21: FOTO 58.   06/29/21: FOTO 60.    Time 8    Period Weeks    Status Not Met    Target Date 06/10/21                   Plan - 07/21/21 1108     Clinical Impression Statement ?Pt exhibits improved postural control compared to last visit as exhibited by performance of static standing drills on compliant surface (Airex pad). She does have continuous dizziness at this time, but this is mild today per report. Patient is awaiting brain MRI to investigate dizziness of unknown cause. She exhibits improving gait quality with decreased excess toe-out and LE external rotation; however, she does still have intermittent LOB, lateral staggering, and ataxia limiting safety with SPC. Pt is most safe with use of rollator at this time. Patient has remaining deficits in LE strength, postural stability, gait changes, mild UE coordination deficits, dec cervical spine AROM (s/p fusion, full AROM not expected), impaired smooth pursuit  (upward/downward) and saccades, impaired VOR cancellation, and difficulty with transferring and stair/obstacle negotiation.  Patient will benefit from continued skilled therapeutic intervention to address the above deficits as needed for improved function and QoL.    Personal Factors and Comorbidities Age;Comorbidity 3+    Comorbidities chronic kidney disease, Type II DM, peripheral neuropathy, hypertension, Hx of A-fib, cervical spine stenosis s/p C3-6 fusion    Examination-Activity Limitations Bed Mobility;Stairs;Stand;Locomotion Level;Transfers;Bend;Dressing    Examination-Participation Restrictions Community Activity;Cleaning    Stability/Clinical Decision Making Evolving/Moderate complexity    Rehab Potential Good    PT Frequency 2x / week    PT Duration 6 weeks    PT Treatment/Interventions Gait training;Stair training;Functional mobility training;Therapeutic activities;Therapeutic exercise;Balance training;Neuromuscular re-education;Patient/family education;ADLs/Self Care Home Management;Cryotherapy;Electrical Stimulation;Moist Heat    PT Next Visit Plan Continue with gait and balance re-training, LE strengthening. UE coordination work in sitting/standing. Pt continuing with cervical AROM drills given by surgeon; may use MT for paracervical sensitivity and soft tissue mobility as needed. Recommend continued PT 2x/week for 6 weeks    PT Home Exercise Plan Access Code: [email or web address]    Consulted and Agree with Plan of Care Patient             Patient will benefit from skilled therapeutic intervention in order to improve the following deficits and impairments:  Abnormal gait, Postural dysfunction, Difficulty walking, Decreased balance, Decreased coordination, Decreased strength, Decreased activity tolerance  Visit Diagnosis: Difficulty in walking, not elsewhere classified  Dizziness and giddiness  Imbalance  Muscle weakness (generalized)  S/P cervical spinal fusion     Problem  List Patient Active Problem List   Diagnosis Date Noted   Cervical myelopathy ([place]) 03/24/2021  Anemia 12/03/2020   Essential hypertension 12/03/2020   Hypothyroidism 12/03/2020   Type 2 diabetes mellitus ([place]) 12/03/2020   Atrial fibrillation ([place]) 09/21/2018   Kidney transplant status 09/21/2018   Osteoporosis 09/21/2018   Valentina Gu, PT, DPT #[id number]  Eilleen Kempf, PT 07/21/2021, 11:09 AM  [facility] [address] [place], [place], [address] Phone: [phone]   Fax:  [phone]  Name: Jean Johnson MRN: [id number] Date of Birth: 07/09/1951

## 2021-07-21 ENCOUNTER — Encounter: Payer: Self-pay | Admitting: Physical Therapy

## 2021-07-22 ENCOUNTER — Ambulatory Visit: Payer: Medicare Other | Admitting: Physical Therapy

## 2021-07-22 NOTE — Patient Instructions (Incomplete)
°  °  TREATMENT   Neuromuscular Re-education - for static and dynamic postural stability as needed to improve balance with performance of ADLs and household/community mobility tasks, equilibrium and non-equilibrium coordination to improve motor control with standing activities     In // bars: -Forward heel-to-toe walking and retro step; 3x D/Bx [verbal cueing and demo for arm swing and heel strike at initial contact] -Standing VOR x 1 (slow cadence); 2x10; horiz and vert, feet apart on Airex  -Standing saccades on Airex, horizontal and vertical; 2x20 each -Standing feet apart, eyes closed; 1x30 sec, bilat -Standing narrow BOS, eyes closed; 2x20 sec ( 3 cm apart)    *next visit* Forward stepping along parallel bars with yellow band around lower limbs to resist adduction; 3x D/B     *not today* -Hurdle step focusing on heel strike at initial contact, holding bilateral parallel bars 4x -Standing on Airex, feet apart, eyes closed; -*Unable to maintain feet together/standing Romberg today Standing toe tap; 2x10 alternating, 6-inch step Standing Romberg; 3x30sec Sidestepping; 2x D/B length of parallel bars with Red Theraband above knees Standing on Airex with feet together; 2x30sec       Therapeutic Exercise - improved strength as needed to improve performance of CKC activities/functional movements and as needed for improved power production to prevent fall during episode of large postural perturbation     Ambulate laps around gym; x 2 laps with SPC RUE   *next visit* Sit to stand with 4-lb Goblet hold, from chair in gym; 2x8     *not today* In // bars: Standing heel raise/toe raise, alternating; 2x10  Seated hip abduction, black Tband; 2x10  Seated march; 2x10 [heaviness with lifting LLE] LAQ; 2x10; 5-lb cuff weight, bilateral LE Ambulate laps around gym; x 1 laps with rollator      MHP (unbilled) utilized pre-treatment for analgesic effect and improved soft tissue  extensibility and to improve participation in active PT intervention, along low back in sitting; x 5 minutes          ASSESSMENT Pt exhibits improved postural control compared to last visit as exhibited by performance of static standing drills on compliant surface (Airex pad). She does have continuous dizziness at this time, but this is mild today per report. Patient is awaiting brain MRI to investigate dizziness of unknown cause. She exhibits improving gait quality with decreased excess toe-out and LE external rotation; however, she does still have intermittent LOB, lateral staggering, and ataxia limiting safety with SPC. Pt is most safe with use of rollator at this time. Patient has remaining deficits in LE strength, postural stability, gait changes, mild UE coordination deficits, dec cervical spine AROM (s/p fusion, full AROM not expected), impaired smooth pursuit (upward/downward) and saccades, impaired VOR cancellation, and difficulty with transferring and stair/obstacle negotiation.  Patient will benefit from continued skilled therapeutic intervention to address the above deficits as needed for improved function and QoL.

## 2021-07-24 ENCOUNTER — Ambulatory Visit: Admission: RE | Admit: 2021-07-24 | Payer: 59 | Source: Ambulatory Visit

## 2021-07-27 ENCOUNTER — Encounter: Payer: Self-pay | Admitting: Physical Therapy

## 2021-07-27 ENCOUNTER — Ambulatory Visit: Payer: Medicare Other | Admitting: Physical Therapy

## 2021-07-27 ENCOUNTER — Other Ambulatory Visit: Payer: Self-pay

## 2021-07-27 DIAGNOSIS — R42 Dizziness and giddiness: Secondary | ICD-10-CM

## 2021-07-27 DIAGNOSIS — R262 Difficulty in walking, not elsewhere classified: Secondary | ICD-10-CM | POA: Diagnosis not present

## 2021-07-27 DIAGNOSIS — M6281 Muscle weakness (generalized): Secondary | ICD-10-CM

## 2021-07-27 DIAGNOSIS — R2689 Other abnormalities of gait and mobility: Secondary | ICD-10-CM

## 2021-07-27 DIAGNOSIS — Z981 Arthrodesis status: Secondary | ICD-10-CM

## 2021-07-27 NOTE — Therapy (Signed)
[facility] [address] [place], [place], [address] Phone: [phone]   Fax:  [phone]  Physical Therapy Treatment  Patient Details  Name: Jean Johnson MRN: [id number] Date of Birth: [birth year] Referring Provider (PT): Meade Maw, MD   Encounter Date: 07/27/2021   PT End of Session - 07/28/21 1319     Visit Number 21    Number of Visits 29    Date for PT Re-Evaluation 08/19/21    Authorization Type Medicare and [employer] - VL based on medical necessity, no auth required    Authorization Time Period initial eval 04/15/21, progress note 06/29/21; Cert 62/37/62-03/10/50    PT Start Time 1434    PT Stop Time 1515    PT Time Calculation (min) 41 min    Equipment Utilized During Treatment --   rollator for negotiating clinic   Activity Tolerance Patient tolerated treatment well    Behavior During Therapy WFL for tasks assessed/performed             Past Medical History:  Diagnosis Date   Anemia of chronic renal disease    Aortic stenosis, mild    a.) TTE on 11/23/2020 --> mean gradient 9.7 mmHg   Atrial fibrillation ([place])    a.) CHA2DS2-VASc = 3 (age, sex, diabetes). b.) daily apixaban   B12 deficiency    Cervical spinal stenosis    Chronic anticoagulation    Apixaban   Diabetic neuropathy (HCC)    Diverticulosis    Dyspnea    ESRD (end stage renal disease) (HCC)    First degree AV block    HLD (hyperlipidemia)    Hypertension    Incomplete right bundle branch block (RBBB)    LAE (left atrial enlargement)    a.) TTE on 11/23/2020 --> moderate   Left thyroid nodule    a.) Cervical MRI on 12/29/2020 --> measured "at least" 3.5 cm; imcompletely imaged.   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   Valvular heart disease    a.) TTE 11/23/2020 --> mild panvalvular regurgitation    Past Surgical History:  Procedure Laterality Date   ANTERIOR CERVICAL DECOMP/DISCECTOMY FUSION N/A 03/24/2021   Procedure: C3-6 ANTERIOR CERVICAL DECOMPRESSION/DISCECTOMY FUSION 3 LEVELS;  Surgeon: Meade Maw, MD;  Location: [facility];  Service: Neurosurgery;  Laterality: N/A;   CATARACT EXTRACTION W/PHACO Left 10/21/2020   Procedure: CATARACT EXTRACTION PHACO AND INTRAOCULAR LENS PLACEMENT ([place]) DIABETES LEFT;  Surgeon: Leandrew Koyanagi, MD;  Location: [facility];  Service: Ophthalmology;  Laterality: Left;  2.84 0:48.8 5.8%   EYE SURGERY Right 2020   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   Subjective Assessment - 07/27/21 1437     Subjective Patient reports mild dizziness at arrival to PT. Her brain MRI was re-scheduled to tomorrow due to insurance approval. Patient reports no recent falls or near-falls. Patient reports no other major changes at arrival to PT.  Pertinent History Patient is a 71 year old female previously seen for dizziness and imbalance, currently s/p C3-6 ACDF (DOS: 03/24/21). No significant complications post-op. She reports some irritation recently along upper periscapular region. Patient is currently donning neck brace until next follow-up with her surgeon. She is refraining from cervical AROM per instructions from physician.  Patient reports using brace the entire day except for completing hygiene tasks, eating, and sleeping. Patient currently has precautions for lifting > one gallon per report;  physician's notes state no lifting > 10 pounds in first 6 weeks. Pt is precautioned from bending and stooping at this time. Patient does have remaining neuropathic symptoms on RUE/RLE. Patient reports no notable sciatic-type symptoms at this time. Patient reports no more urinary urgency/frequency than usual; she does occasionally feel hurried to get to the bathroom. Patient reports no fever, chills, night sweats. Pt has worked on gait with home health; she has walked as far 208 feet. She reports significant difficulty with stair negotiation at this time. Patient reports no stairs to enter/exit home. She has to negotiate 14 steps to get to her bedroom - handrail on L side. Pt has tub shower with shower bench; no grab bars. Pt has to negotiate gravel to get into her home. Pt reports having intermittent slight dizziness, but "not that much" recently.    Limitations Walking;House hold activities;Standing    Diagnostic tests Intraoperative fluoroscopy - no post-op imaging yet    Patient Stated Goals Able to walk with improved steadiness               TREATMENT   Neuromuscular Re-education - for static and dynamic postural stability as needed to improve balance with performance of ADLs and household/community mobility tasks, equilibrium and non-equilibrium coordination to improve motor control with standing activities     In // bars: -Hurdle step focusing on heel strike at initial contact, holding bilateral parallel bars; x10 on each LE -Forward heel-to-toe walking and retro step; 3x D/Bx [verbal cueing and demo for arm swing and heel strike at initial contact] -Standing VOR x 1 (slow cadence); 2x10; horiz and vert, feet together on floor -Standing saccades, feet together, horizontal and vertical; 2x20 each -Forward stepping along parallel bars with yellow band around lower limbs to resist adduction; 3x D/B   *next visit* -Standing feet apart, eyes closed; 1x30 sec -Standing narrow BOS, eyes  closed; 2x20 sec ( 3 cm apart)     *not today* -Standing on Airex, feet apart, eyes closed; -*Unable to maintain feet together/standing Romberg today Standing toe tap; 2x10 alternating, 6-inch step Standing Romberg; 3x30sec Sidestepping; 2x D/B length of parallel bars with Red Theraband above knees Standing on Airex with feet together; 2x30sec       Therapeutic Exercise - improved strength as needed to improve performance of CKC activities/functional movements and as needed for improved power production to prevent fall during episode of large postural perturbation     Ambulate laps around gym; x 2 laps with SPC RUE   Sit to stand with 4-lb [place]let hold, from chair in gym; 2x8     *not today* In // bars: Standing heel raise/toe raise, alternating; 2x10  Seated hip abduction, black Tband; 2x10  Seated march; 2x10 [heaviness with lifting LLE] LAQ; 2x10; 5-lb cuff weight, bilateral LE Ambulate laps around gym; x 1 laps with rollator      *not today* MHP (unbilled) utilized pre-treatment for analgesic effect and improved soft tissue extensibility and to improve participation in active PT intervention, along  low back in sitting; x 5 minutes          ASSESSMENT Patient tolerates saccadic motion well without notable effect on dizziness. She has mild increase in symptoms with VOR horizontal performance. Patient demonstrates ongoing gait stability deficits with intermittent cross-over gait pattern. She demonstrates lesser degree of toe-out during gait. 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 Manuella Ghazi following brain MRI. Patient has remaining deficits in LE strength, postural stability, gait changes, mild UE coordination deficits, dec cervical spine AROM (s/p fusion, full AROM not expected), impaired smooth pursuit (upward/downward) and saccades, impaired VOR cancellation, and difficulty with transferring and stair/obstacle negotiation.  Patient will  benefit from continued skilled therapeutic intervention to address the above deficits as needed for improved function and QoL.         PT Short Term Goals - 06/29/21 1554       PT SHORT TERM GOAL #1   Title Pt will be independent with HEP in order to improve strength and balance in order to decrease fall risk and improve function at home and work.    Baseline 04/15/21: HEP initiated.   05/19/21: Pt is complaint with HEP given in PT, initiating home exercises from surgeon this week.   06/29/21: compliant and independent with hom exercise program.    Time 3    Period Days    Status Achieved    Target Date 05/06/21      PT SHORT TERM GOAL #2   Title Patient will demonstrate modified independent gait with SPC for home-mobility distance (150 ft or greater) in clinic with no LOB and safe management of AD    Baseline 04/15/21: Patient using rollator as primary means of mobility.   05/19/21: Pt still using rollator as primary means of mobility; she has demonstrated gait in parallel bars with no AD with intermittent LOB requiring minA to maintain balance; will practice SPC ambulation next visit.   06/29/21: Patient ambulates with SPC with intermittent ModA required to prevent LOB for home mobility distance.    Time 4    Period Weeks    Status Partially Met    Target Date 05/13/21               PT Long Term Goals - 06/29/21 1445       PT LONG TERM GOAL #1   Title Pt will improve BERG by at least 3 points in order to demonstrate clinically significant improvement in balance.    Baseline 04/15/21: Baseline BERG to be obtained next visit.   04/19/21: 35/56.  05/18/21: 40/56   06/29/21: 38/56    Time 8    Period Weeks    Status Achieved    Target Date 05/18/21      PT LONG TERM GOAL #2   Title Pt will decrease 5TSTS by at least 3 seconds in order to demonstrate clinically significant improvement in LE strength.    Baseline 04/15/21: 5TSTS 31 sec.   05/19/21: Improved by 4 seconds.   06/29/21:  5TSTS improved by 8 seconds relative to initial eval (23 sec).    Time 8    Period Weeks    Status Achieved    Target Date 05/18/21      PT LONG TERM GOAL #3   Title Pt will decrease TUG to below 14 seconds/decrease in order to demonstrate decreased fall risk.    Baseline 04/15/21: TUG 25 sec.   05/18/21: TUG 22.6 sec.   06/29/21:  TUG 20.1 sec    Time 8    Period Weeks    Status Not Met    Target Date 06/10/21      PT LONG TERM GOAL #4   Title Patient will demonstrate improved function as evidenced by a score of 64 on FOTO measure for full participation in activities at home and in the community.    Baseline 04/15/21: FOTO 53.   05/18/21: FOTO 58.   06/29/21: FOTO 60.    Time 8    Period Weeks    Status Not Met    Target Date 06/10/21                   Plan - 07/28/21 1337     Clinical Impression Statement Patient tolerates saccadic motion well without notable effect on dizziness. She has mild increase in symptoms with VOR horizontal performance. Patient demonstrates ongoing gait stability deficits with intermittent cross-over gait pattern. She demonstrates lesser degree of toe-out during gait. 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 Manuella Ghazi following brain MRI. Patient has remaining deficits in LE strength, postural stability, gait changes, mild UE coordination deficits, dec cervical spine AROM (s/p fusion, full AROM not expected), impaired smooth pursuit (upward/downward) and saccades, impaired VOR cancellation, and difficulty with transferring and stair/obstacle negotiation.  Patient will benefit from continued skilled therapeutic intervention to address the above deficits as needed for improved function and QoL.    Personal Factors and Comorbidities Age;Comorbidity 3+    Comorbidities chronic kidney disease, Type II DM, peripheral neuropathy, hypertension, Hx of A-fib, cervical spine stenosis s/p C3-6 fusion    Examination-Activity  Limitations Bed Mobility;Stairs;Stand;Locomotion Level;Transfers;Bend;Dressing    Examination-Participation Restrictions Community Activity;Cleaning    Stability/Clinical Decision Making Evolving/Moderate complexity    Rehab Potential Good    PT Frequency 2x / week    PT Duration 6 weeks    PT Treatment/Interventions Gait training;Stair training;Functional mobility training;Therapeutic activities;Therapeutic exercise;Balance training;Neuromuscular re-education;Patient/family education;ADLs/Self Care Home Management;Cryotherapy;Electrical Stimulation;Moist Heat    PT Next Visit Plan Continue with gait and balance re-training, LE strengthening. UE coordination work in sitting/standing. Pt continuing with cervical AROM drills given by surgeon; may use MT for paracervical sensitivity and soft tissue mobility as needed. Recommend continued PT 2x/week for 6 weeks    PT Home Exercise Plan Access Code: [email or web address]    Consulted and Agree with Plan of Care Patient             Patient will benefit from skilled therapeutic intervention in order to improve the following deficits and impairments:  Abnormal gait, Postural dysfunction, Difficulty walking, Decreased balance, Decreased coordination, Decreased strength, Decreased activity tolerance  Visit Diagnosis: Difficulty in walking, not elsewhere classified  Dizziness and giddiness  Imbalance  Muscle weakness (generalized)  S/P cervical spinal fusion     Problem List Patient Active Problem List   Diagnosis Date Noted   Cervical myelopathy ([place]) 03/24/2021   Anemia 12/03/2020   Essential hypertension 12/03/2020   Hypothyroidism 12/03/2020   Type 2 diabetes mellitus ([place]) 12/03/2020   Atrial fibrillation ([place]) 09/21/2018   Kidney transplant status 09/21/2018   Osteoporosis 09/21/2018   Valentina Gu, PT, DPT #[id number]  Eilleen Kempf, PT 07/28/2021, 1:37 PM  Agency [facility] [address] [place], [place], [address] Phone: [phone]   Fax:  [phone]  Name: Nyisha Clippard MRN: [id number] Date of Birth: 05-14-1951

## 2021-07-28 ENCOUNTER — Ambulatory Visit: Payer: 59

## 2021-07-29 ENCOUNTER — Other Ambulatory Visit: Payer: Self-pay

## 2021-07-29 ENCOUNTER — Ambulatory Visit: Payer: Medicare Other | Admitting: Physical Therapy

## 2021-07-29 DIAGNOSIS — R42 Dizziness and giddiness: Secondary | ICD-10-CM

## 2021-07-29 DIAGNOSIS — R262 Difficulty in walking, not elsewhere classified: Secondary | ICD-10-CM | POA: Diagnosis not present

## 2021-07-29 DIAGNOSIS — Z981 Arthrodesis status: Secondary | ICD-10-CM

## 2021-07-29 DIAGNOSIS — M6281 Muscle weakness (generalized): Secondary | ICD-10-CM

## 2021-07-29 DIAGNOSIS — R2689 Other abnormalities of gait and mobility: Secondary | ICD-10-CM

## 2021-07-29 NOTE — Therapy (Signed)
[facility] [address] [place], [place], [address] Phone: [phone]   Fax:  [phone]  Physical Therapy Treatment/Physical Therapy Progress Note and Re-certification   Dates of reporting period  06/29/21   to   07/29/21   Patient Details  Name: Jean Johnson MRN: [id number] Date of Birth: January 28, 1951 Referring Provider (PT): Meade Maw, MD   Encounter Date: 07/29/2021   PT End of Session - 07/31/21 0828     Visit Number 22    Number of Visits 30    Date for PT Re-Evaluation 08/19/21    Authorization Type Medicare and [employer] - VL based on medical necessity, no auth required    Authorization Time Period initial eval 04/15/21, last progress note 07/29/21; Cert 11/17/30-6/71/24    PT Start Time 1425    PT Stop Time 1515    PT Time Calculation (min) 50 min    Equipment Utilized During Treatment Gait belt   rollator for negotiating clinic   Activity Tolerance Patient tolerated treatment well    Behavior During Therapy WFL for tasks assessed/performed              Past Medical History:  Diagnosis Date   Anemia of chronic renal disease    Aortic stenosis, mild    a.) TTE on 11/23/2020 --> mean gradient 9.7 mmHg   Atrial fibrillation (HCC)    a.) CHA2DS2-VASc = 3 (age, sex, diabetes). b.) daily apixaban   B12 deficiency    Cervical spinal stenosis    Chronic anticoagulation    Apixaban   Diabetic neuropathy (HCC)    Diverticulosis    Dyspnea    ESRD (end stage renal disease) (HCC)    First degree AV block    HLD (hyperlipidemia)    Hypertension    Incomplete right bundle branch block (RBBB)    LAE (left atrial enlargement)    a.) TTE on 11/23/2020 --> moderate   Left thyroid nodule    a.) Cervical MRI on 12/29/2020 --> measured "at least" 3.5 cm; imcompletely imaged.   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   Valvular heart disease    a.) TTE 11/23/2020 --> mild panvalvular regurgitation    Past Surgical History:  Procedure Laterality Date   ANTERIOR CERVICAL DECOMP/DISCECTOMY FUSION N/A 03/24/2021   Procedure: C3-6 ANTERIOR CERVICAL DECOMPRESSION/DISCECTOMY FUSION 3 LEVELS;  Surgeon: Meade Maw, MD;  Location: [facility];  Service: Neurosurgery;  Laterality: N/A;   CATARACT EXTRACTION W/PHACO Left 10/21/2020   Procedure: CATARACT EXTRACTION PHACO AND INTRAOCULAR LENS PLACEMENT ([place]) DIABETES LEFT;  Surgeon: Leandrew Koyanagi, MD;  Location: [facility];  Service: Ophthalmology;  Laterality: Left;  2.84 0:48.8 5.8%   EYE SURGERY Right 2020   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   Subjective Assessment - 07/30/21 1424     Subjective Patient is still awaiting brain MRI - it was re-scheduled due to insurance  approval pending. Patient reports mild dizziness at arrival to PT. She feels that she has made progress to date, but she feels that she is notably limited by comorbid vertigo for which pt is following up with neurology. She reports fair compliance with her HEP. She denies significant pain in neck at arrival to PT.    Pertinent History Patient is a 71 year old female previously seen for dizziness and imbalance, currently s/p C3-6 ACDF (DOS: 03/24/21). No significant complications post-op. She reports some irritation recently along upper periscapular region. Patient is currently donning neck brace until next  follow-up with her surgeon. She is refraining from cervical AROM per instructions from physician.  Patient reports using brace the entire day except for completing hygiene tasks, eating, and sleeping. Patient currently has precautions for lifting > one gallon per report; physician's notes state no lifting > 10 pounds in first 6 weeks. Pt is precautioned from bending and stooping at this time. Patient does have remaining neuropathic symptoms on RUE/RLE. Patient reports no notable sciatic-type symptoms at this time. Patient reports no more urinary urgency/frequency than usual; she does occasionally feel hurried to get to the bathroom. Patient reports no fever, chills, night sweats. Pt has worked on gait with home health; she has walked as far 208 feet. She reports significant difficulty with stair negotiation at this time. Patient reports no stairs to enter/exit home. She has to negotiate 14 steps to get to her bedroom - handrail on L side. Pt has tub shower with shower bench; no grab bars. Pt has to negotiate gravel to get into her home. Pt reports having intermittent slight dizziness, but "not that much" recently.    Limitations Walking;House hold activities;Standing    Diagnostic tests Intraoperative fluoroscopy - no post-op imaging yet    Patient Stated Goals Able to walk with improved steadiness                OBJECTIVE FINDINGS   Observation Surgical incision well healed at this time   Gait Ambulating with rollator: forward lean onto AD; mild Trendelenburg L, decreased single-limb support time bilaterally, pt ambulates with lower limbs externally rotated R>L;   SPC: intermittent cross-over pattern during swing phase with decreased arm swing and truncal rotation, decreased single-limb support time  and decreased heel strike at initial contact   Strength R/L 4+/4 Hip flexion 5/5 Hip abduction (seated) 5/5 Hip adduction 5/4+ Knee extension 4+/4+ Knee flexion 5/5 Ankle Dorsiflexion 4/4  Ankle Plantarflexion *indicates pain     AROM Cervical flexion:  42 Cervical extension: 26 Lateral flexion: Right 33, Left 18* Cervical rotation: Right 50, Left 45 *Indicates pain         FUNCTIONAL OUTCOME MEASURES         Results Comments  BERG 04/19/21: 35/56 05/18/21: 40/56 06/29/21: 38/56 07/29/21: 41/56 Fall risk, in need of intervention  TUG 04/15/21: 25 seconds 05/18/21: 22.6 seconds 06/29/21: 20.1 seconds 07/29/21: 18.3 seconds  Performed using rollator. Above cut-off score for fall risk  5TSTS 04/15/21: 31 seconds 05/18/21: 27 seconds 06/29/21: 23 seconds 07/29/21: 21 seconds  Fall risk  10 Meter Gait Speed 04/15/21: Self-selected: s =  0.67 m/s;     05/18/21: Self-selected: s = 0.52 m/s 06/29/21: Self-selected: = 0.64 m/s 07/29/21: Deferred until next visit Below normative values for full community ambulation             TREATMENT   Therapeutic Activities  Re-assessment performed (see above and updated Goal section below)    Neuromuscular Re-education -  for static and dynamic postural stability as needed to improve balance with performance of ADLs and household/community mobility tasks, equilibrium and non-equilibrium coordination to improve motor control with standing activities     In // bars: -Forward heel-to-toe walking and retro step; 4x D/Bx [verbal cueing and demo for arm swing and heel strike at initial contact] -Hurdle step focusing on heel strike at initial contact, holding bilateral parallel bars; x10 on each LE -Standing toe tap; 2x10 alternating, 6-inch step     *next visit* -Standing feet apart, eyes closed; 1x30 sec -Standing narrow BOS, eyes closed; 2x20 sec ( 3 cm apart) -Standing VOR x 1 (slow cadence); 2x10; horiz and vert, feet together on floor -Standing saccades, feet together, horizontal and vertical; 2x20 each -Forward stepping along parallel bars with yellow band around lower limbs to resist adduction; 3x D/B     *not  today* -Standing on Airex, feet apart, eyes closed; -*Unable to maintain feet together/standing Romberg today Standing Romberg; 3x30sec Sidestepping; 2x D/B length of parallel bars with Red Theraband above knees Standing on Airex with feet together; 2x30sec       Therapeutic Exercise - improved strength as needed to improve performance of CKC activities/functional movements and as needed for improved power production to prevent fall during episode of large postural perturbation     Ambulate laps around gym; x 2 laps with SPC RUE  *next visit*  Sit to stand with 4-lb Goblet hold, from chair in gym; 2x8     *not today* In // bars: Standing heel raise/toe raise, alternating; 2x10  Seated hip abduction, black Tband; 2x10  Seated march; 2x10 [heaviness with lifting LLE] LAQ; 2x10; 5-lb cuff weight, bilateral LE Ambulate laps around gym; x 1 laps with rollator      *not today* MHP (unbilled) utilized pre-treatment for analgesic effect and improved soft tissue extensibility and to improve participation in active PT intervention, along low back in sitting; x 5 minutes          ASSESSMENT Patient has improved BERG score, TUG and 5-times sit to stand compared to her previous progress note. She feels that she has made fair progress compared to outset of PT, but she reports ongoing difficulty with gait and staggering associated with comorbid dizziness that may be associated with central etiology. She unfortunately has not further progressed with FOTO score. She is still not safe for using SPC as primary means of functional mobility. She has limited cervical spine AROM as expected following multi-level fusion; she has mild neck pain/sensation of stiffness intermittently. She is awaiting advanced imaging and further follow-up with neurology for dizziness/dysequilibrium. Pt has made notable progress to date, but progress has been gradual over the last 2 re-assessments given complex medical history  and comorbid vertigo for which patient has continued with occulomotor training and gaze stability work (pt previously underwent screen for vertigo and is not candidate for canalith repositioning techniques). Patient has remaining deficits in LE strength, postural stability, gait changes, mild UE coordination deficits, dec cervical spine AROM (s/p fusion, full AROM not expected), impaired smooth pursuit (upward/downward) and saccades, impaired VOR cancellation, and difficulty with transferring and stair/obstacle negotiation.  Patient will benefit from continued skilled therapeutic intervention to address the above deficits as needed for improved function and QoL.           PT Short Term Goals - 07/30/21 1426       PT SHORT TERM GOAL #1   Title Pt will be independent with HEP in  order to improve strength and balance in order to decrease fall risk and improve function at home and work.    Baseline 04/15/21: HEP initiated.   05/19/21: Pt is complaint with HEP given in PT, initiating home exercises from surgeon this week.   06/29/21: compliant and independent with hom exercise program.    Time 3    Period Days    Status Achieved    Target Date 05/06/21      PT SHORT TERM GOAL #2   Title Patient will demonstrate modified independent gait with SPC for home-mobility distance (150 ft or greater) in clinic with no LOB and safe management of AD    Baseline 04/15/21: Patient using rollator as primary means of mobility.   05/19/21: Pt still using rollator as primary means of mobility; she has demonstrated gait in parallel bars with no AD with intermittent LOB requiring minA to maintain balance; will practice SPC ambulation next visit.   06/29/21: Patient ambulates with SPC with intermittent ModA required to prevent LOB for home mobility distance.  07/29/21: Patient requires CGA with use of SPC with ModA intermittently to prevent lateral staggering while rounding corner.    Time 4    Period Weeks    Status  Partially Met    Target Date 05/13/21               PT Long Term Goals - 07/29/21 1447       PT LONG TERM GOAL #1   Title Pt will improve BERG by at least 3 points in order to demonstrate clinically significant improvement in balance.    Baseline 04/15/21: Baseline BERG to be obtained next visit.   04/19/21: 35/56.  05/18/21: 40/56   06/29/21: 38/56.  07/29/21: 41/56    Time 8    Period Weeks    Status Achieved    Target Date 05/18/21      PT LONG TERM GOAL #2   Title Pt will decrease 5TSTS by at least 3 seconds in order to demonstrate clinically significant improvement in LE strength.    Baseline 04/15/21: 5TSTS 31 sec.   05/19/21: Improved by 4 seconds.   06/29/21: 5TSTS improved by 8 seconds relative to initial eval (23 sec).  07/29/21: Improved by 10 seconds relative to initial eval (21 sec).    Time 8    Period Weeks    Status Achieved    Target Date 05/18/21      PT LONG TERM GOAL #3   Title Pt will decrease TUG to below 14 seconds/decrease in order to demonstrate decreased fall risk.    Baseline 04/15/21: TUG 25 sec.   05/18/21: TUG 22.6 sec.   06/29/21:  TUG 20.1 sec   07/29/21: TUG 18.3 sec    Time 8    Period Weeks    Status Partially Met    Target Date 06/10/21      PT LONG TERM GOAL #4   Title Patient will demonstrate improved function as evidenced by a score of 64 on FOTO measure for full participation in activities at home and in the community.    Baseline 04/15/21: FOTO 53.   05/18/21: FOTO 58.   06/29/21: FOTO 60.  07/29/21: FOTO 60.    Time 8    Period Weeks    Status Not Met    Target Date 06/10/21                   Plan - 07/31/21 3710  Clinical Impression Statement Patient has improved BERG score, TUG and 5-times sit to stand compared to her previous progress note. She feels that she has made fair progress compared to outset of PT, but she reports ongoing difficulty with gait and staggering associated with comorbid dizziness that may be associated  with central etiology. She unfortunately has not further progressed with FOTO score. She is still not safe for using SPC as primary means of functional mobility. She has limited cervical spine AROM as expected following multi-level fusion; she has mild neck pain/sensation of stiffness intermittently. She is awaiting advanced imaging and further follow-up with neurology for dizziness/dysequilibrium. Pt has made notable progress to date, but progress has been gradual over the last 2 re-assessments given complex medical history and comorbid vertigo for which patient has continued with occulomotor training and gaze stability work (pt previously underwent screen for vertigo and is not candidate for canalith repositioning techniques). Patient has remaining deficits in LE strength, postural stability, gait changes, mild UE coordination deficits, dec cervical spine AROM (s/p fusion, full AROM not expected), impaired smooth pursuit (upward/downward) and saccades, impaired VOR cancellation, and difficulty with transferring and stair/obstacle negotiation.  Patient will benefit from continued skilled therapeutic intervention to address the above deficits as needed for improved function and QoL.    Personal Factors and Comorbidities Age;Comorbidity 3+    Comorbidities chronic kidney disease, Type II DM, peripheral neuropathy, hypertension, Hx of A-fib, cervical spine stenosis s/p C3-6 fusion    Examination-Activity Limitations Bed Mobility;Stairs;Stand;Locomotion Level;Transfers;Bend;Dressing    Examination-Participation Restrictions Community Activity;Cleaning    Stability/Clinical Decision Making Evolving/Moderate complexity    Rehab Potential Good    PT Frequency 2x / week    PT Duration 6 weeks    PT Treatment/Interventions Gait training;Stair training;Functional mobility training;Therapeutic activities;Therapeutic exercise;Balance training;Neuromuscular re-education;Patient/family education;ADLs/Self Care Home  Management;Cryotherapy;Electrical Stimulation;Moist Heat    PT Next Visit Plan Continue with gait and balance re-training, LE strengthening. UE coordination work in sitting/standing. Pt continuing with cervical AROM drills given by surgeon; may use MT for paracervical sensitivity and soft tissue mobility as needed. Recommend continued PT 2x/week for 4 weeks    PT Home Exercise Plan Access Code: [email or web address]    Consulted and Agree with Plan of Care Patient             Patient will benefit from skilled therapeutic intervention in order to improve the following deficits and impairments:  Abnormal gait, Postural dysfunction, Difficulty walking, Decreased balance, Decreased coordination, Decreased strength, Decreased activity tolerance  Visit Diagnosis: Difficulty in walking, not elsewhere classified  Dizziness and giddiness  Imbalance  Muscle weakness (generalized)  S/P cervical spinal fusion     Problem List Patient Active Problem List   Diagnosis Date Noted   Cervical myelopathy ([place]) 03/24/2021   Anemia 12/03/2020   Essential hypertension 12/03/2020   Hypothyroidism 12/03/2020   Type 2 diabetes mellitus ([place]) 12/03/2020   Atrial fibrillation ([place]) 09/21/2018   Kidney transplant status 09/21/2018   Osteoporosis 09/21/2018   Valentina Gu, PT, DPT #[id number]  Eilleen Kempf, PT 07/31/2021, 8:29 AM  [facility] [address] [place], [place], [address] Phone: [phone]   Fax:  [phone]  Name: Jean Johnson MRN: [id number] Date of Birth: [birth year]

## 2021-07-30 ENCOUNTER — Encounter: Payer: Self-pay | Admitting: Physical Therapy

## 2021-08-03 ENCOUNTER — Ambulatory Visit: Payer: Medicare Other | Admitting: Physical Therapy

## 2021-08-05 ENCOUNTER — Encounter: Payer: Self-pay | Admitting: Physical Therapy

## 2021-08-05 ENCOUNTER — Other Ambulatory Visit: Payer: Self-pay

## 2021-08-05 ENCOUNTER — Ambulatory Visit: Payer: Medicare Other | Admitting: Physical Therapy

## 2021-08-05 DIAGNOSIS — R262 Difficulty in walking, not elsewhere classified: Secondary | ICD-10-CM | POA: Diagnosis not present

## 2021-08-05 DIAGNOSIS — M6281 Muscle weakness (generalized): Secondary | ICD-10-CM

## 2021-08-05 DIAGNOSIS — R2689 Other abnormalities of gait and mobility: Secondary | ICD-10-CM

## 2021-08-05 DIAGNOSIS — Z981 Arthrodesis status: Secondary | ICD-10-CM

## 2021-08-05 DIAGNOSIS — R42 Dizziness and giddiness: Secondary | ICD-10-CM

## 2021-08-05 NOTE — Therapy (Signed)
[facility] [address] [place], [place], [address] Phone: [phone]   Fax:  [phone]  Patient Details  Name: Jean Johnson MRN: [id number] Date of Birth: 11/15/1950 Referring Provider:  Meade Maw, MD  Encounter Date: 08/05/2021   Eilleen Kempf, PT 08/05/2021, 2:27 PM  [facility] [address] [name], [place], [address] Phone: [phone]   Fax:  [phone]

## 2021-08-05 NOTE — Therapy (Signed)
[facility] [address] [place], [place], [address] Phone: [phone]   Fax:  [phone]  Physical Therapy Treatment  Patient Details  Name: Jean Johnson MRN: [id number] Date of Birth: [birth year] Referring Provider (PT): Meade Maw, MD   Encounter Date: 08/05/2021   PT End of Session - 08/07/21 1525     Visit Number 23    Number of Visits 30    Date for PT Re-Evaluation 08/19/21    Authorization Type Medicare and [employer] - VL based on medical necessity, no auth required    Authorization Time Period initial eval 04/15/21, last progress note 07/29/21; Cert 07/13/13-9/45/85    PT Start Time 1428    PT Stop Time 1520    PT Time Calculation (min) 52 min    Equipment Utilized During Treatment Gait belt   rollator for negotiating clinic   Activity Tolerance Patient tolerated treatment well    Behavior During Therapy WFL for tasks assessed/performed              Past Medical History:  Diagnosis Date   Anemia of chronic renal disease    Aortic stenosis, mild    a.) TTE on 11/23/2020 --> mean gradient 9.7 mmHg   Atrial fibrillation (HCC)    a.) CHA2DS2-VASc = 3 (age, sex, diabetes). b.) daily apixaban   B12 deficiency    Cervical spinal stenosis    Chronic anticoagulation    Apixaban   Diabetic neuropathy (HCC)    Diverticulosis    Dyspnea    ESRD (end stage renal disease) (HCC)    First degree AV block    HLD (hyperlipidemia)    Hypertension    Incomplete right bundle branch block (RBBB)    LAE (left atrial enlargement)    a.) TTE on 11/23/2020 --> moderate   Left thyroid nodule    a.) Cervical MRI on 12/29/2020 --> measured "at least" 3.5 cm; imcompletely imaged.   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   Valvular heart disease    a.) TTE 11/23/2020 --> mild panvalvular regurgitation    Past Surgical History:  Procedure Laterality Date   ANTERIOR CERVICAL DECOMP/DISCECTOMY FUSION N/A 03/24/2021   Procedure: C3-6 ANTERIOR CERVICAL DECOMPRESSION/DISCECTOMY FUSION 3 LEVELS;  Surgeon: Meade Maw, MD;  Location: [facility];  Service: Neurosurgery;  Laterality: N/A;   CATARACT EXTRACTION W/PHACO Left 10/21/2020   Procedure: CATARACT EXTRACTION PHACO AND INTRAOCULAR LENS PLACEMENT ([place]) DIABETES LEFT;  Surgeon: Leandrew Koyanagi, MD;  Location: [facility];  Service: Ophthalmology;  Laterality: Left;  2.84 0:48.8 5.8%   EYE SURGERY Right 2020   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   Subjective Assessment - 08/07/21 1525     Subjective Patient reports no major issues since last visit. She has brain MRI tomorrow. Patient reports she felt well first thing in the morning today. Patient repots no recent falls at home or new  safety concerns. She reports 8/10 dizziness at arrival to PT today. Patient feels she has made fair progress, "improved a little bit."    Pertinent History Patient is a 71 year old female previously seen for dizziness and imbalance, currently s/p C3-6 ACDF (DOS: 03/24/21). No significant complications post-op. She reports some irritation recently along upper periscapular region. Patient is currently donning neck brace until next follow-up with her surgeon. She is refraining from cervical AROM per instructions from physician.  Patient reports using brace the entire day except for completing hygiene  tasks, eating, and sleeping. Patient currently has precautions for lifting > one gallon per report; physician's notes state no lifting > 10 pounds in first 6 weeks. Pt is precautioned from bending and stooping at this time. Patient does have remaining neuropathic symptoms on RUE/RLE. Patient reports no notable sciatic-type symptoms at this time. Patient reports no more urinary urgency/frequency than usual; she does occasionally feel hurried to get to the bathroom. Patient reports no fever, chills, night sweats. Pt has worked on gait with home health; she has walked as far 208 feet. She reports significant difficulty with stair negotiation at this time. Patient reports no stairs to enter/exit home. She has to negotiate 14 steps to get to her bedroom - handrail on L side. Pt has tub shower with shower bench; no grab bars. Pt has to negotiate gravel to get into her home. Pt reports having intermittent slight dizziness, but "not that much" recently.    Limitations Walking;House hold activities;Standing    Diagnostic tests Intraoperative fluoroscopy - no post-op imaging yet    Patient Stated Goals Able to walk with improved steadiness                10-meter gait speed: Self-selected = 0.58 m/s, fast speed = 0.74    TREATMENT    Neuromuscular Re-education - for static and dynamic postural stability as needed to improve balance with performance of ADLs and household/community mobility tasks, equilibrium and non-equilibrium coordination to improve motor control with standing activities     In // bars: -Forward heel-to-toe walking and retro step; 4x D/Bx [verbal cueing and demo for arm swing and heel strike at initial contact] -Hurdle step focusing on heel strike at initial contact, holding bilateral parallel bars; x10 on each LE -Standing toe tap; 2x10 alternating, 6-inch step -Standing feet apart, eyes closed; 2x30 sec -Standing VOR x 1 (slow cadence); 2x12; horiz and vert, feet together on  floor -Standing saccades, feet together, horizontal and vertical; 2x25 each   *next visit* -Standing narrow BOS, eyes closed; 2x20 sec ( 3 cm apart)    *not today* -Forward stepping along parallel bars with yellow band around lower limbs to resist adduction; 3x D/B -Standing on Airex, feet apart, eyes closed; -*Unable to maintain feet together/standing Romberg today Standing Romberg; 3x30sec Sidestepping; 2x D/B length of parallel bars with Red Theraband above knees Standing on Airex with feet together; 2x30sec       Therapeutic Exercise - improved strength as needed to improve performance of CKC activities/functional movements and as needed for improved power production to prevent fall during episode of large postural perturbation     Ambulate laps around gym; x 3 laps with SPC RUE   *next visit*  Sit to stand with 4-lb Goblet hold, from chair in gym; 2x8     *not today* In // bars: Standing heel raise/toe raise, alternating; 2x10  Seated hip abduction, black Tband; 2x10  Seated march; 2x10 [heaviness with lifting LLE] LAQ;  2x10; 5-lb cuff weight, bilateral LE Ambulate laps around gym; x 1 laps with rollator      *not today* MHP (unbilled) utilized pre-treatment for analgesic effect and improved soft tissue extensibility and to improve participation in active PT intervention, along low back in sitting; x 5 minutes          ASSESSMENT Patient has ongoing ataxic gait pattern with intermittent cross-over pattern. She has mild increase in symptoms transiently with VOR drills; pt tolerates saccadic drills without notable increase in pain. She has ongoing dizziness reported at 8/10 and is undergoing further diagnostic workup to determine etiology of central vertigo with neurology. Pt needs further work on strengthening, fall risk management, and balance and vestibular training. Patient has remaining deficits in LE strength, postural stability, gait changes, mild UE coordination  deficits, dec cervical spine AROM (s/p fusion, full AROM not expected), impaired smooth pursuit (upward/downward) and saccades, impaired VOR cancellation, and difficulty with transferring and stair/obstacle negotiation.  Patient will benefit from continued skilled therapeutic intervention to address the above deficits as needed for improved function and QoL.       PT Short Term Goals - 07/30/21 1426       PT SHORT TERM GOAL #1   Title Pt will be independent with HEP in order to improve strength and balance in order to decrease fall risk and improve function at home and work.    Baseline 04/15/21: HEP initiated.   05/19/21: Pt is complaint with HEP given in PT, initiating home exercises from surgeon this week.   06/29/21: compliant and independent with hom exercise program.    Time 3    Period Days    Status Achieved    Target Date 05/06/21      PT SHORT TERM GOAL #2   Title Patient will demonstrate modified independent gait with SPC for home-mobility distance (150 ft or greater) in clinic with no LOB and safe management of AD    Baseline 04/15/21: Patient using rollator as primary means of mobility.   05/19/21: Pt still using rollator as primary means of mobility; she has demonstrated gait in parallel bars with no AD with intermittent LOB requiring minA to maintain balance; will practice SPC ambulation next visit.   06/29/21: Patient ambulates with SPC with intermittent ModA required to prevent LOB for home mobility distance.  07/29/21: Patient requires CGA with use of SPC with ModA intermittently to prevent lateral staggering while rounding corner.    Time 4    Period Weeks    Status Partially Met    Target Date 05/13/21               PT Long Term Goals - 07/29/21 1447       PT LONG TERM GOAL #1   Title Pt will improve BERG by at least 3 points in order to demonstrate clinically significant improvement in balance.    Baseline 04/15/21: Baseline BERG to be obtained next visit.   04/19/21:  35/56.  05/18/21: 40/56   06/29/21: 38/56.  07/29/21: 41/56    Time 8    Period Weeks    Status Achieved    Target Date 05/18/21      PT LONG TERM GOAL #2   Title Pt will decrease 5TSTS by at least 3 seconds in order to demonstrate clinically significant improvement in LE strength.    Baseline 04/15/21: 5TSTS 31 sec.   05/19/21: Improved by 4 seconds.   06/29/21: 5TSTS improved by 8 seconds relative to  initial eval (23 sec).  07/29/21: Improved by 10 seconds relative to initial eval (21 sec).    Time 8    Period Weeks    Status Achieved    Target Date 05/18/21      PT LONG TERM GOAL #3   Title Pt will decrease TUG to below 14 seconds/decrease in order to demonstrate decreased fall risk.    Baseline 04/15/21: TUG 25 sec.   05/18/21: TUG 22.6 sec.   06/29/21:  TUG 20.1 sec   07/29/21: TUG 18.3 sec    Time 8    Period Weeks    Status Partially Met    Target Date 06/10/21      PT LONG TERM GOAL #4   Title Patient will demonstrate improved function as evidenced by a score of 64 on FOTO measure for full participation in activities at home and in the community.    Baseline 04/15/21: FOTO 53.   05/18/21: FOTO 58.   06/29/21: FOTO 60.  07/29/21: FOTO 60.    Time 8    Period Weeks    Status Not Met    Target Date 06/10/21                   Plan - 08/07/21 1530     Clinical Impression Statement Patient has ongoing ataxic gait pattern with intermittent cross-over pattern. She has mild increase in symptoms transiently with VOR drills; pt tolerates saccadic drills without notable increase in pain. She has ongoing dizziness reported at 8/10 and is undergoing further diagnostic workup to determine etiology of central vertigo with neurology. Pt needs further work on strengthening, fall risk management, and balance and vestibular training. Patient has remaining deficits in LE strength, postural stability, gait changes, mild UE coordination deficits, dec cervical spine AROM (s/p fusion, full AROM not  expected), impaired smooth pursuit (upward/downward) and saccades, impaired VOR cancellation, and difficulty with transferring and stair/obstacle negotiation.  Patient will benefit from continued skilled therapeutic intervention to address the above deficits as needed for improved function and QoL.    Personal Factors and Comorbidities Age;Comorbidity 3+    Comorbidities chronic kidney disease, Type II DM, peripheral neuropathy, hypertension, Hx of A-fib, cervical spine stenosis s/p C3-6 fusion    Examination-Activity Limitations Bed Mobility;Stairs;Stand;Locomotion Level;Transfers;Bend;Dressing    Examination-Participation Restrictions Community Activity;Cleaning    Stability/Clinical Decision Making Evolving/Moderate complexity    Rehab Potential Good    PT Frequency 2x / week    PT Duration 6 weeks    PT Treatment/Interventions Gait training;Stair training;Functional mobility training;Therapeutic activities;Therapeutic exercise;Balance training;Neuromuscular re-education;Patient/family education;ADLs/Self Care Home Management;Cryotherapy;Electrical Stimulation;Moist Heat    PT Next Visit Plan Continue with gait and balance re-training, LE strengthening. UE coordination work in sitting/standing. Pt continuing with cervical AROM drills given by surgeon; may use MT for paracervical sensitivity and soft tissue mobility as needed. Recommend continued PT 2x/week for 4 weeks    PT Home Exercise Plan Access Code: [email or web address]    Consulted and Agree with Plan of Care Patient             Patient will benefit from skilled therapeutic intervention in order to improve the following deficits and impairments:  Abnormal gait, Postural dysfunction, Difficulty walking, Decreased balance, Decreased coordination, Decreased strength, Decreased activity tolerance  Visit Diagnosis: Difficulty in walking, not elsewhere classified  Dizziness and giddiness  Imbalance  Muscle weakness (generalized)  S/P cervical  spinal fusion     Problem List Patient Active Problem List   Diagnosis Date Noted  Cervical myelopathy ([place]) 03/24/2021   Anemia 12/03/2020   Essential hypertension 12/03/2020   Hypothyroidism 12/03/2020   Type 2 diabetes mellitus ([place]) 12/03/2020   Atrial fibrillation ([place]) 09/21/2018   Kidney transplant status 09/21/2018   Osteoporosis 09/21/2018   Valentina Gu, PT, DPT #[id number]  Eilleen Kempf, PT 08/07/2021, 3:30 PM  [facility] [address] [place], [place], [address] Phone: [phone]   Fax:  [phone]  Name: Jean Johnson MRN: [id number] Date of Birth: May 09, 1951

## 2021-08-06 ENCOUNTER — Ambulatory Visit
Admission: RE | Admit: 2021-08-06 | Discharge: 2021-08-06 | Disposition: A | Discharge status: Home / Self Care | Payer: Medicare Other | Source: Ambulatory Visit | Attending: Neurology | Admitting: Neurology

## 2021-08-06 DIAGNOSIS — R42 Dizziness and giddiness: Secondary | ICD-10-CM | POA: Insufficient documentation

## 2021-08-10 ENCOUNTER — Other Ambulatory Visit: Payer: Self-pay

## 2021-08-10 ENCOUNTER — Encounter: Payer: Self-pay | Admitting: Physical Therapy

## 2021-08-10 ENCOUNTER — Ambulatory Visit: Payer: Medicare Other | Admitting: Physical Therapy

## 2021-08-10 DIAGNOSIS — R262 Difficulty in walking, not elsewhere classified: Secondary | ICD-10-CM | POA: Diagnosis not present

## 2021-08-10 DIAGNOSIS — R42 Dizziness and giddiness: Secondary | ICD-10-CM

## 2021-08-10 DIAGNOSIS — R2689 Other abnormalities of gait and mobility: Secondary | ICD-10-CM

## 2021-08-10 DIAGNOSIS — Z981 Arthrodesis status: Secondary | ICD-10-CM

## 2021-08-10 DIAGNOSIS — M6281 Muscle weakness (generalized): Secondary | ICD-10-CM

## 2021-08-10 NOTE — Therapy (Signed)
[facility] [address] [place], [place], [address] Phone: [phone]   Fax:  [phone]  Physical Therapy Treatment  Patient Details  Name: Jean Johnson MRN: [id number] Date of Birth: 07/24/1950 Referring Provider (PT): Meade Maw, MD   Encounter Date: 08/10/2021   PT End of Session - 08/10/21 1444     Visit Number 24    Number of Visits 30    Date for PT Re-Evaluation 08/19/21    Authorization Type Medicare and [employer] - VL based on medical necessity, no auth required    Authorization Time Period initial eval 04/15/21, last progress note 07/29/21; Cert 0/60/04-5/99/77    PT Start Time 1430    PT Stop Time 1515    PT Time Calculation (min) 45 min    Equipment Utilized During Treatment Gait belt   rollator for negotiating clinic   Activity Tolerance Patient tolerated treatment well    Behavior During Therapy WFL for tasks assessed/performed             Past Medical History:  Diagnosis Date   Anemia of chronic renal disease    Aortic stenosis, mild    a.) TTE on 11/23/2020 --> mean gradient 9.7 mmHg   Atrial fibrillation (HCC)    a.) CHA2DS2-VASc = 3 (age, sex, diabetes). b.) daily apixaban   B12 deficiency    Cervical spinal stenosis    Chronic anticoagulation    Apixaban   Diabetic neuropathy (HCC)    Diverticulosis    Dyspnea    ESRD (end stage renal disease) (HCC)    First degree AV block    HLD (hyperlipidemia)    Hypertension    Incomplete right bundle branch block (RBBB)    LAE (left atrial enlargement)    a.) TTE on 11/23/2020 --> moderate   Left thyroid nodule    a.) Cervical MRI on 12/29/2020 --> measured "at least" 3.5 cm; imcompletely imaged.   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   Valvular heart disease    a.) TTE 11/23/2020 --> mild panvalvular regurgitation    Past Surgical History:  Procedure Laterality Date   ANTERIOR CERVICAL DECOMP/DISCECTOMY FUSION N/A 03/24/2021   Procedure: C3-6 ANTERIOR CERVICAL DECOMPRESSION/DISCECTOMY FUSION 3 LEVELS;  Surgeon: Meade Maw, MD;  Location: [facility];  Service: Neurosurgery;  Laterality: N/A;   CATARACT EXTRACTION W/PHACO Left 10/21/2020   Procedure: CATARACT EXTRACTION PHACO AND INTRAOCULAR LENS PLACEMENT ([place]) DIABETES LEFT;  Surgeon: Leandrew Koyanagi, MD;  Location: [place] [facility];  Service: Ophthalmology;  Laterality: Left;  2.84 0:48.8 5.8%   EYE SURGERY Right 2020   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   Subjective Assessment - 08/10/21 1437     Subjective Patient reports feeling mildly dizzy at arrival to PT. She has undergone MRI and is awaiting follow-up with physician. Patient reports having incident yesterday during which she fell between couch and chair while using  her phone. Patient reports no new onset of hip or back pain today. Patient reports no pain with weightbearing.    Pertinent History Patient is a 71 year old female previously seen for dizziness and imbalance, currently s/p C3-6 ACDF (DOS: 03/24/21). No significant complications post-op. She reports some irritation recently along upper periscapular region. Patient is currently donning neck brace until next follow-up with her surgeon. She is refraining from cervical AROM per instructions from physician.  Patient reports using brace the entire day except for completing hygiene  tasks, eating, and sleeping. Patient currently has precautions for lifting > one gallon per report; physician's notes state no lifting > 10 pounds in first 6 weeks. Pt is precautioned from bending and stooping at this time. Patient does have remaining neuropathic symptoms on RUE/RLE. Patient reports no notable sciatic-type symptoms at this time. Patient reports no more urinary urgency/frequency than usual; she does occasionally feel hurried to get to the bathroom. Patient reports no fever, chills, night sweats. Pt has worked on gait with home health; she has walked as far 208 feet. She reports significant difficulty with stair negotiation at this time. Patient reports no stairs to enter/exit home. She has to negotiate 14 steps to get to her bedroom - handrail on L side. Pt has tub shower with shower bench; no grab bars. Pt has to negotiate gravel to get into her home. Pt reports having intermittent slight dizziness, but "not that much" recently.    Limitations Walking;House hold activities;Standing    Diagnostic tests Intraoperative fluoroscopy - no post-op imaging yet    Patient Stated Goals Able to walk with improved steadiness              TREATMENT     Neuromuscular Re-education - for static and dynamic postural stability as needed to improve balance with performance of ADLs and household/community mobility tasks, equilibrium and non-equilibrium coordination to improve motor control with standing activities     In // bars: -Forward heel-to-toe walking and retro step; 3x D/Bx [verbal cueing and demo for arm swing and heel strike at initial contact] -Standing feet apart, eyes closed; 2x30 sec -Standing VOR x 1 (slow cadence); x25; horiz and vert, feet together on floor -Standing saccades, feet together, horizontal and vertical; 1x23 vertical, 1x25 horizontal Sidestepping; 2x D/B length of parallel bars    -Gait with head turns in hallway, with rollator; x2 D/B; multi-directional cueing  for up/down/left/right     *next visit* -Standing narrow BOS, eyes closed; 2x20 sec ( 3 cm apart)     *not today* -Standing toe tap; 2x10 alternating, 6-inch step -Hurdle step focusing on heel strike at initial contact, holding bilateral parallel bars; x10 on each LE -Forward stepping along parallel bars with yellow band around lower limbs to resist adduction; 3x D/B -Standing on Airex, feet apart, eyes closed; -*Unable to maintain feet together/standing Romberg today Standing Romberg; 3x30sec Standing on Airex with feet together; 2x30sec       Therapeutic Exercise - improved strength as needed to improve performance of CKC activities/functional movements and as needed for improved power production to prevent fall during episode of large postural perturbation     Ambulate laps around gym; x 3 laps with SPC RUE, with verbal cueing for heel strike and decreased excessive toe-out   *next visit*  Sit to stand with 4-lb Goblet hold, from chair in gym; 2x8     *not today* In // bars: Standing heel raise/toe raise, alternating; 2x10  Seated hip abduction, black Tband; 2x10  Seated march; 2x10 [heaviness with lifting LLE] LAQ; 2x10; 5-lb cuff weight, bilateral LE Ambulate laps around gym; x 1 laps with rollator      *not today* MHP (unbilled) utilized pre-treatment for analgesic effect and improved soft tissue extensibility and to improve participation in active PT intervention, along low back in sitting; x 5 minutes          ASSESSMENT Patient's MRI demonstrates minimal small vessel ischemic change in pons and cerebral hemispheric white matter with no acute or subacute finding. Pt is awaiting follow-up with physician for further workup. Patient has ongoing constant dizziness and gait ataxia. She demonstrates improving ability to perform transferring and negotiate longer distances with rollator. Patient has remaining deficits in LE strength, postural stability, gait changes, mild UE  coordination deficits, dec cervical spine AROM (s/p fusion, full AROM not expected), impaired smooth pursuit (upward/downward) and saccades, impaired VOR cancellation, and difficulty with transferring and stair/obstacle negotiation.  Patient will benefit from continued skilled therapeutic intervention to address the above deficits as needed for improved function and QoL.        PT Short Term Goals - 07/30/21 1426       PT SHORT TERM GOAL #1   Title Pt will be independent with HEP in order to improve strength and balance in order to decrease fall risk and improve function at home and work.    Baseline 04/15/21: HEP initiated.   05/19/21: Pt is complaint with HEP given in PT, initiating home exercises from surgeon this week.   06/29/21: compliant and independent with hom exercise program.    Time 3    Period Days    Status Achieved    Target Date 05/06/21      PT SHORT TERM GOAL #2   Title Patient will demonstrate modified independent gait with SPC for home-mobility distance (150 ft or greater) in clinic with no LOB and safe management of AD    Baseline 04/15/21: Patient using rollator as primary means of mobility.   05/19/21: Pt still using rollator as primary means of mobility; she has demonstrated gait in parallel bars with no AD with intermittent LOB requiring minA to maintain balance; will practice SPC ambulation next visit.   06/29/21: Patient ambulates with SPC with intermittent ModA required to prevent LOB for home mobility distance.  07/29/21: Patient requires CGA with use of SPC with ModA intermittently to prevent lateral staggering while rounding corner.    Time 4    Period Weeks    Status Partially Met    Target Date 05/13/21               PT Long Term Goals - 07/29/21 1447       PT LONG TERM GOAL #1   Title Pt will improve BERG by at least 3 points in order to demonstrate clinically significant improvement in balance.    Baseline 04/15/21: Baseline BERG to be obtained next  visit.   04/19/21: 35/56.  05/18/21: 40/56   06/29/21: 38/56.  07/29/21: 41/56    Time 8    Period Weeks    Status Achieved    Target Date 05/18/21      PT LONG TERM GOAL #2   Title Pt will decrease 5TSTS by at least 3 seconds in order to demonstrate clinically significant improvement in LE strength.    Baseline 04/15/21: 5TSTS 31 sec.   05/19/21: Improved by 4 seconds.   06/29/21: 5TSTS improved by 8 seconds relative to initial eval (23 sec).  07/29/21: Improved by 10 seconds relative to initial eval (21 sec).    Time 8    Period Weeks    Status Achieved    Target Date 05/18/21      PT LONG TERM GOAL #3   Title Pt will decrease TUG to below 14 seconds/decrease in order to demonstrate decreased fall risk.    Baseline 04/15/21: TUG 25 sec.   05/18/21: TUG 22.6 sec.   06/29/21:  TUG 20.1 sec   07/29/21: TUG 18.3 sec    Time 8    Period Weeks    Status Partially Met    Target Date 06/10/21      PT LONG TERM GOAL #4   Title Patient will demonstrate improved function as evidenced by a score of 64 on FOTO measure for full participation in activities at home and in the community.    Baseline 04/15/21: FOTO 53.   05/18/21: FOTO 58.   06/29/21: FOTO 60.  07/29/21: FOTO 60.    Time 8    Period Weeks    Status Not Met    Target Date 06/10/21                   Plan - 08/10/21 2138     Clinical Impression Statement Patient's MRI demonstrates minimal small vessel ischemic change in pons and cerebral hemispheric white matter with no acute or subacute finding. Pt is awaiting follow-up with physician for further workup. Patient has ongoing constant dizziness and gait ataxia. She demonstrates improving ability to perform transferring and negotiate longer distances with rollator. Patient has remaining deficits in LE strength, postural stability, gait changes, mild UE coordination deficits, dec cervical spine AROM (s/p fusion, full AROM not expected), impaired smooth pursuit (upward/downward) and  saccades, impaired VOR cancellation, and difficulty with transferring and stair/obstacle negotiation.  Patient will benefit from continued skilled therapeutic intervention to address the above deficits as needed for improved function and QoL.    Personal Factors and Comorbidities Age;Comorbidity 3+    Comorbidities chronic kidney disease, Type II DM, peripheral neuropathy, hypertension, Hx of A-fib, cervical spine stenosis s/p C3-6 fusion    Examination-Activity Limitations Bed Mobility;Stairs;Stand;Locomotion Level;Transfers;Bend;Dressing    Examination-Participation Restrictions Community Activity;Cleaning    Stability/Clinical Decision Making Evolving/Moderate complexity    Rehab Potential Good    PT Frequency 2x / week    PT Duration 6 weeks    PT Treatment/Interventions Gait training;Stair training;Functional mobility training;Therapeutic activities;Therapeutic exercise;Balance training;Neuromuscular re-education;Patient/family education;ADLs/Self Care Home Management;Cryotherapy;Electrical Stimulation;Moist Heat    PT Next Visit Plan Continue with gait and balance re-training, LE strengthening. UE coordination work in sitting/standing. Pt continuing with cervical AROM drills given by surgeon; may use MT for paracervical sensitivity and soft tissue mobility as needed. Recommend continued PT 2x/week for 4 weeks    PT Home Exercise Plan Access Code: [email or web address]    Consulted and Agree with Plan of Care Patient             Patient will benefit from skilled therapeutic intervention in order to improve the following deficits and impairments:  Abnormal gait, Postural dysfunction, Difficulty walking, Decreased balance, Decreased coordination, Decreased strength, Decreased activity tolerance  Visit Diagnosis: Difficulty in walking, not elsewhere classified  Dizziness and giddiness  Imbalance  Muscle weakness (generalized)  S/P cervical spinal fusion     Problem List Patient Active  Problem List   Diagnosis Date Noted   Cervical myelopathy ([place]) 03/24/2021   Anemia 12/03/2020   Essential hypertension 12/03/2020   Hypothyroidism  12/03/2020   Type 2 diabetes mellitus ([place]) 12/03/2020   Atrial fibrillation ([place]) 09/21/2018   Kidney transplant status 09/21/2018   Osteoporosis 09/21/2018   Valentina Gu, PT, DPT #[id number]  Eilleen Kempf, PT 08/10/2021, 9:39 PM  [facility] [address] [place], [place], [address] Phone: [phone]   Fax:  [phone]  Name: Jean Johnson MRN: [id number] Date of Birth: [birth year]

## 2021-08-12 ENCOUNTER — Other Ambulatory Visit: Payer: Self-pay

## 2021-08-12 ENCOUNTER — Ambulatory Visit: Payer: Medicare Other | Attending: Neurosurgery | Admitting: Physical Therapy

## 2021-08-12 ENCOUNTER — Encounter: Payer: Self-pay | Admitting: Physical Therapy

## 2021-08-12 DIAGNOSIS — M6281 Muscle weakness (generalized): Secondary | ICD-10-CM | POA: Diagnosis present

## 2021-08-12 DIAGNOSIS — R42 Dizziness and giddiness: Secondary | ICD-10-CM | POA: Insufficient documentation

## 2021-08-12 DIAGNOSIS — R262 Difficulty in walking, not elsewhere classified: Secondary | ICD-10-CM | POA: Diagnosis not present

## 2021-08-12 DIAGNOSIS — R2689 Other abnormalities of gait and mobility: Secondary | ICD-10-CM | POA: Insufficient documentation

## 2021-08-12 DIAGNOSIS — Z981 Arthrodesis status: Secondary | ICD-10-CM | POA: Diagnosis present

## 2021-08-12 NOTE — Therapy (Signed)
[name] [address] [place], [place], [address] Phone: [phone]   Fax:  [phone]  Physical Therapy Treatment  Patient Details  Name: Jean Johnson MRN: [id number] Date of Birth: 06/01/1951 Referring Provider (PT): Meade Maw, MD   Encounter Date: 08/12/2021   PT End of Session - 08/14/21 1054     Visit Number 25    Number of Visits 30    Date for PT Re-Evaluation 08/19/21    Authorization Type Medicare and [employer] - VL based on medical necessity, no auth required    Authorization Time Period initial eval 04/15/21, last progress note 07/29/21; Cert 01/14/85-7/54/49    PT Start Time 1429    PT Stop Time 1515    PT Time Calculation (min) 46 min    Equipment Utilized During Treatment Gait belt   rollator for negotiating clinic   Activity Tolerance Patient tolerated treatment well    Behavior During Therapy WFL for tasks assessed/performed              Past Medical History:  Diagnosis Date   Anemia of chronic renal disease    Aortic stenosis, mild    a.) TTE on 11/23/2020 --> mean gradient 9.7 mmHg   Atrial fibrillation (HCC)    a.) CHA2DS2-VASc = 3 (age, sex, diabetes). b.) daily apixaban   B12 deficiency    Cervical spinal stenosis    Chronic anticoagulation    Apixaban   Diabetic neuropathy (HCC)    Diverticulosis    Dyspnea    ESRD (end stage renal disease) (HCC)    First degree AV block    HLD (hyperlipidemia)    Hypertension    Incomplete right bundle branch block (RBBB)    LAE (left atrial enlargement)    a.) TTE on 11/23/2020 --> moderate   Left thyroid nodule    a.) Cervical MRI on 12/29/2020 --> measured "at least" 3.5 cm; imcompletely imaged.   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   Valvular heart disease    a.) TTE 11/23/2020 --> mild panvalvular regurgitation    Past Surgical History:  Procedure Laterality Date   ANTERIOR CERVICAL DECOMP/DISCECTOMY FUSION N/A 03/24/2021   Procedure: C3-6 ANTERIOR CERVICAL DECOMPRESSION/DISCECTOMY FUSION 3 LEVELS;  Surgeon: Meade Maw, MD;  Location: [facility];  Service: Neurosurgery;  Laterality: N/A;   CATARACT EXTRACTION W/PHACO Left 10/21/2020   Procedure: CATARACT EXTRACTION PHACO AND INTRAOCULAR LENS PLACEMENT ([place]) DIABETES LEFT;  Surgeon: Leandrew Koyanagi, MD;  Location: [place];  Service: Ophthalmology;  Laterality: Left;  2.84 0:48.8 5.8%   EYE SURGERY Right 2020   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   Subjective Assessment - 08/14/21 1054     Subjective Patient reports ongoing dizziness at arrival to PT, 7/10 dizziness at arrival. Patient reports difficulty with gait c SPC at this time; pt feels more safe with rollator. Patient reports completing her HEP.  Pertinent History Patient is a 71 year old female previously seen for dizziness and imbalance, currently s/p C3-6 ACDF (DOS: 03/24/21). No significant complications post-op. She reports some irritation recently along upper periscapular region. Patient is currently donning neck brace until next follow-up with her surgeon. She is refraining from cervical AROM per instructions from physician.  Patient reports using brace the entire day except for completing hygiene tasks, eating, and sleeping. Patient currently has precautions for lifting > one gallon per report; physician's notes  state no lifting > 10 pounds in first 6 weeks. Pt is precautioned from bending and stooping at this time. Patient does have remaining neuropathic symptoms on RUE/RLE. Patient reports no notable sciatic-type symptoms at this time. Patient reports no more urinary urgency/frequency than usual; she does occasionally feel hurried to get to the bathroom. Patient reports no fever, chills, night sweats. Pt has worked on gait with home health; she has walked as far 208 feet. She reports significant difficulty with stair negotiation at this time. Patient reports no stairs to enter/exit home. She has to negotiate 14 steps to get to her bedroom - handrail on L side. Pt has tub shower with shower bench; no grab bars. Pt has to negotiate gravel to get into her home. Pt reports having intermittent slight dizziness, but "not that much" recently.    Limitations Walking;House hold activities;Standing    Diagnostic tests Intraoperative fluoroscopy - no post-op imaging yet    Patient Stated Goals Able to walk with improved steadiness                  TREATMENT     Neuromuscular Re-education - for static and dynamic postural stability as needed to improve balance with performance of ADLs and household/community mobility tasks, equilibrium and non-equilibrium coordination to improve motor control with standing activities     In // bars: -Forward heel-to-toe walking and retro step; 3x D/Bx [verbal cueing and demo for arm swing and heel strike at initial contact] -Sidestepping; 3x D/B length of parallel bars [break between 2nd and 3rd set]  -Standing VOR x 1 (slow cadence); 3x12; horiz and vert, feet together on floor -Standing narrow BOS, eyes closed; 3x10 sec ( 3 cm apart) -Standing feet apart, eyes closed; 1x20sec, 1x60sec  -Gait with head turns in hallway, with rollator; x2 D/B; multi-directional cueing for up/down/left/right   *next visit* VOR cancellation with visual target en bloc       *not  today* -Standing saccades, feet together, horizontal and vertical; 1x23 vertical, 3x12 horizontal -Standing toe tap; 2x10 alternating, 6-inch step -Hurdle step focusing on heel strike at initial contact, holding bilateral parallel bars; x10 on each LE -Forward stepping along parallel bars with yellow band around lower limbs to resist adduction; 3x D/B -Standing on Airex, feet apart, eyes closed; -*Unable to maintain feet together/standing Romberg today Standing Romberg; 3x30sec Standing on Airex with feet together; 2x30sec       Therapeutic Exercise - improved strength as needed to improve performance of CKC activities/functional movements and as needed for improved power production to prevent fall during episode of large postural perturbation     Ambulate laps around gym; x 3 laps with SPC RUE, with verbal cueing for heel strike and decreased excessive toe-out   *next visit* Sit to stand with 4-lb [place] hold, from chair in gym; 2x8     *not today* In // bars: Standing heel raise/toe raise, alternating; 2x10  Seated hip abduction, black Tband; 2x10  Seated march; 2x10 [heaviness with lifting  LLE] LAQ; 2x10; 5-lb cuff weight, bilateral LE Ambulate laps around gym; x 1 laps with rollator      *not today* MHP (unbilled) utilized pre-treatment for analgesic effect and improved soft tissue extensibility and to improve participation in active PT intervention, along low back in sitting; x 5 minutes          ASSESSMENT Patient has ongoing dizziness that is continuous throughout the day with seldom periods in AM during which patient has minimal symptoms. She demonstrates continued ataxic pattern with R>LLE cross-over pattern (intermittent negative step width). She has remaining lower extremity weakness and impaired postural control necessitating further intervention. Imaging and diagnostic workup to date has not determined specific etiology for continuous dizziness/dysequilibrium - pt  does have various comorbidities contributing to her current condition. Patient has remaining deficits in LE strength, postural stability, gait changes, mild UE coordination deficits, dec cervical spine AROM (s/p fusion, full AROM not expected), impaired smooth pursuit (upward/downward) and saccades, impaired VOR cancellation, and difficulty with transferring and stair/obstacle negotiation.  Patient will benefit from continued skilled therapeutic intervention to address the above deficits as needed for improved function and QoL.        PT Short Term Goals - 07/30/21 1426       PT SHORT TERM GOAL #1   Title Pt will be independent with HEP in order to improve strength and balance in order to decrease fall risk and improve function at home and work.    Baseline 04/15/21: HEP initiated.   05/19/21: Pt is complaint with HEP given in PT, initiating home exercises from surgeon this week.   06/29/21: compliant and independent with hom exercise program.    Time 3    Period Days    Status Achieved    Target Date 05/06/21      PT SHORT TERM GOAL #2   Title Patient will demonstrate modified independent gait with SPC for home-mobility distance (150 ft or greater) in clinic with no LOB and safe management of AD    Baseline 04/15/21: Patient using rollator as primary means of mobility.   05/19/21: Pt still using rollator as primary means of mobility; she has demonstrated gait in parallel bars with no AD with intermittent LOB requiring minA to maintain balance; will practice SPC ambulation next visit.   06/29/21: Patient ambulates with SPC with intermittent ModA required to prevent LOB for home mobility distance.  07/29/21: Patient requires CGA with use of SPC with ModA intermittently to prevent lateral staggering while rounding corner.    Time 4    Period Weeks    Status Partially Met    Target Date 05/13/21               PT Long Term Goals - 07/29/21 1447       PT LONG TERM GOAL #1   Title Pt will  improve BERG by at least 3 points in order to demonstrate clinically significant improvement in balance.    Baseline 04/15/21: Baseline BERG to be obtained next visit.   04/19/21: 35/56.  05/18/21: 40/56   06/29/21: 38/56.  07/29/21: 41/56    Time 8    Period Weeks    Status Achieved    Target Date 05/18/21      PT LONG TERM GOAL #2   Title Pt will decrease 5TSTS by at least 3 seconds in order to demonstrate clinically significant improvement in LE strength.    Baseline 04/15/21: 5TSTS 31 sec.   05/19/21: Improved by 4  seconds.   06/29/21: 5TSTS improved by 8 seconds relative to initial eval (23 sec).  07/29/21: Improved by 10 seconds relative to initial eval (21 sec).    Time 8    Period Weeks    Status Achieved    Target Date 05/18/21      PT LONG TERM GOAL #3   Title Pt will decrease TUG to below 14 seconds/decrease in order to demonstrate decreased fall risk.    Baseline 04/15/21: TUG 25 sec.   05/18/21: TUG 22.6 sec.   06/29/21:  TUG 20.1 sec   07/29/21: TUG 18.3 sec    Time 8    Period Weeks    Status Partially Met    Target Date 06/10/21      PT LONG TERM GOAL #4   Title Patient will demonstrate improved function as evidenced by a score of 64 on FOTO measure for full participation in activities at home and in the community.    Baseline 04/15/21: FOTO 53.   05/18/21: FOTO 58.   06/29/21: FOTO 60.  07/29/21: FOTO 60.    Time 8    Period Weeks    Status Not Met    Target Date 06/10/21                   Plan - 08/14/21 1107     Clinical Impression Statement Patient has ongoing dizziness that is continuous throughout the day with seldom periods in AM during which patient has minimal symptoms. She demonstrates continued ataxic pattern with R>LLE cross-over pattern (intermittent negative step width). She has remaining lower extremity weakness and impaired postural control necessitating further intervention. Imaging and diagnostic workup to date has not determined specific etiology for  continuous dizziness/dysequilibrium - pt does have various comorbidities contributing to her current condition. Patient has remaining deficits in LE strength, postural stability, gait changes, mild UE coordination deficits, dec cervical spine AROM (s/p fusion, full AROM not expected), impaired smooth pursuit (upward/downward) and saccades, impaired VOR cancellation, and difficulty with transferring and stair/obstacle negotiation.  Patient will benefit from continued skilled therapeutic intervention to address the above deficits as needed for improved function and QoL.    Personal Factors and Comorbidities Age;Comorbidity 3+    Comorbidities chronic kidney disease, Type II DM, peripheral neuropathy, hypertension, Hx of A-fib, cervical spine stenosis s/p C3-6 fusion    Examination-Activity Limitations Bed Mobility;Stairs;Stand;Locomotion Level;Transfers;Bend;Dressing    Examination-Participation Restrictions Community Activity;Cleaning    Stability/Clinical Decision Making Evolving/Moderate complexity    Rehab Potential Good    PT Frequency 2x / week    PT Duration 6 weeks    PT Treatment/Interventions Gait training;Stair training;Functional mobility training;Therapeutic activities;Therapeutic exercise;Balance training;Neuromuscular re-education;Patient/family education;ADLs/Self Care Home Management;Cryotherapy;Electrical Stimulation;Moist Heat    PT Next Visit Plan Continue with gait and balance re-training, LE strengthening. UE coordination work in sitting/standing. Pt continuing with cervical AROM drills given by surgeon; may use MT for paracervical sensitivity and soft tissue mobility as needed. Recommend continued PT 2x/week for 4 weeks    PT Home Exercise Plan Access Code: [email or web address]    Consulted and Agree with Plan of Care Patient             Patient will benefit from skilled therapeutic intervention in order to improve the following deficits and impairments:  Abnormal gait, Postural  dysfunction, Difficulty walking, Decreased balance, Decreased coordination, Decreased strength, Decreased activity tolerance  Visit Diagnosis: Difficulty in walking, not elsewhere classified  Dizziness and giddiness  Imbalance  Muscle weakness (generalized)  S/P cervical spinal fusion     Problem List Patient Active Problem List   Diagnosis Date Noted   Cervical myelopathy ([place]) 03/24/2021   Anemia 12/03/2020   Essential hypertension 12/03/2020   Hypothyroidism 12/03/2020   Type 2 diabetes mellitus ([place]) 12/03/2020   Atrial fibrillation ([place]) 09/21/2018   Kidney transplant status 09/21/2018   Osteoporosis 09/21/2018   Valentina Gu, PT, DPT #[id number]  Eilleen Kempf, PT 08/14/2021, 11:09 AM  [place] [facility] [address]. [place], [place], [address] Phone: [phone]   Fax:  [phone]  Name: Isadore Bokhari MRN: [id number] Date of Birth: Jun 24, 1951

## 2021-08-16 ENCOUNTER — Other Ambulatory Visit: Payer: Self-pay

## 2021-08-16 ENCOUNTER — Ambulatory Visit: Payer: Medicare Other | Admitting: Physical Therapy

## 2021-08-16 ENCOUNTER — Encounter: Payer: Self-pay | Admitting: Physical Therapy

## 2021-08-16 DIAGNOSIS — R262 Difficulty in walking, not elsewhere classified: Secondary | ICD-10-CM | POA: Diagnosis not present

## 2021-08-16 DIAGNOSIS — Z981 Arthrodesis status: Secondary | ICD-10-CM

## 2021-08-16 DIAGNOSIS — R2689 Other abnormalities of gait and mobility: Secondary | ICD-10-CM

## 2021-08-16 DIAGNOSIS — M6281 Muscle weakness (generalized): Secondary | ICD-10-CM

## 2021-08-16 DIAGNOSIS — R42 Dizziness and giddiness: Secondary | ICD-10-CM

## 2021-08-16 NOTE — Therapy (Signed)
[facility] [address] [place], [place], [address] Phone: [phone]   Fax:  [phone]  Physical Therapy Treatment  Patient Details  Name: Lucia Harm MRN: [id number] Date of Birth: [birth year] Referring Provider (PT): Meade Maw, MD   Encounter Date: 08/16/2021   PT End of Session - 08/16/21 1110     Visit Number 26    Number of Visits 30    Date for PT Re-Evaluation 08/19/21    Authorization Type Medicare and [employer] - VL based on medical necessity, no auth required    Authorization Time Period initial eval 04/15/21, last progress note 07/29/21; Cert 9/98/33-03/04/04    PT Start Time 1059    PT Stop Time 1143    PT Time Calculation (min) 44 min    Equipment Utilized During Treatment Gait belt   rollator for negotiating clinic   Activity Tolerance Patient tolerated treatment well    Behavior During Therapy WFL for tasks assessed/performed             Past Medical History:  Diagnosis Date   Anemia of chronic renal disease    Aortic stenosis, mild    a.) TTE on 11/23/2020 --> mean gradient 9.7 mmHg   Atrial fibrillation (HCC)    a.) CHA2DS2-VASc = 3 (age, sex, diabetes). b.) daily apixaban   B12 deficiency    Cervical spinal stenosis    Chronic anticoagulation    Apixaban   Diabetic neuropathy (HCC)    Diverticulosis    Dyspnea    ESRD (end stage renal disease) (HCC)    First degree AV block    HLD (hyperlipidemia)    Hypertension    Incomplete right bundle branch block (RBBB)    LAE (left atrial enlargement)    a.) TTE on 11/23/2020 --> moderate   Left thyroid nodule    a.) Cervical MRI on 12/29/2020 --> measured "at least" 3.5 cm; imcompletely imaged.   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   Valvular heart disease    a.) TTE 11/23/2020 --> mild panvalvular regurgitation    Past Surgical History:  Procedure Laterality Date   ANTERIOR CERVICAL DECOMP/DISCECTOMY FUSION N/A 03/24/2021   Procedure: C3-6 ANTERIOR CERVICAL DECOMPRESSION/DISCECTOMY FUSION 3 LEVELS;  Surgeon: Meade Maw, MD;  Location: [facility];  Service: Neurosurgery;  Laterality: N/A;   CATARACT EXTRACTION W/PHACO Left 10/21/2020   Procedure: CATARACT EXTRACTION PHACO AND INTRAOCULAR LENS PLACEMENT ([place]) DIABETES LEFT;  Surgeon: Leandrew Koyanagi, MD;  Location: [facility];  Service: Ophthalmology;  Laterality: Left;  2.84 0:48.8 5.8%   EYE SURGERY Right 2020   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   Subjective Assessment - 08/16/21 1102     Subjective Patient was contracted by neurologist's office and was told there were no findings consistent with stroke/CVA. Pt complains of being cold at arrival. Blood glucose was in the 70s this AM prior to eating.  She reports ongoing dizziness this AM that is continuous.    Pertinent History Patient is a 71 year old female previously seen for dizziness and imbalance, currently s/p C3-6 ACDF (DOS: 03/24/21). No significant complications post-op. She reports some irritation recently along upper periscapular region. Patient is currently donning neck brace until next follow-up with her surgeon. She is refraining from cervical AROM per instructions from physician.  Patient reports using brace the entire day except for completing hygiene tasks, eating, and sleeping. Patient currently has precautions for  lifting > one gallon per report; physician's notes state no lifting > 10 pounds in first 6 weeks. Pt is precautioned from bending and stooping at this time. Patient does have remaining neuropathic symptoms on RUE/RLE. Patient reports no notable sciatic-type symptoms at this time. Patient reports no more urinary urgency/frequency than usual; she does occasionally feel hurried to get to the bathroom. Patient reports no fever, chills, night sweats. Pt has worked on gait with home health; she has walked as far 208 feet. She reports significant difficulty with stair negotiation at this time. Patient reports no stairs to enter/exit home. She has to negotiate 14 steps to get to her bedroom - handrail on L side. Pt has tub shower with shower bench; no grab bars. Pt has to negotiate gravel to get into her home. Pt reports having intermittent slight dizziness, but "not that much" recently.    Limitations Walking;House hold activities;Standing    Diagnostic tests Intraoperative fluoroscopy - no post-op imaging yet    Patient Stated Goals Able to walk with improved steadiness               TREATMENT     Neuromuscular Re-education - for static and dynamic postural stability as needed to improve balance with performance of ADLs and household/community mobility tasks, equilibrium and non-equilibrium coordination to improve motor control with standing activities     In // bars: -Forward heel-to-toe walking and retro step; 3x D/Bx [verbal cueing for heel strike at initial contact] -Sidestepping; 2x D/B length of parallel bars  -Standing VOR x 1 (slow cadence); 3x12; horiz and vert, feet together on floor -Standing narrow BOS, eyes closed; 3x10 sec ( 3 cm apart) -Standing feet apart, eyes closed; 1x20sec, 1x60sec -VOR cancellation with visual target en bloc; using thumb for target; 2x30 seconds in standing    -Gait in hallway with visual scanning and identifying targets ; x 2 D/B (identifying letters and  numbers on wall)   *not today* -Gait with head turns in hallway, with rollator; x2 D/B; multi-directional cueing for up/down/left/right -Standing saccades, feet together, horizontal and vertical; 1x23 vertical, 3x12 horizontal -Standing toe tap; 2x10 alternating, 6-inch step -Hurdle step focusing on heel strike at initial contact, holding bilateral parallel bars; x10 on each LE -Forward stepping along parallel bars with yellow band around lower limbs to resist adduction; 3x D/B -Standing on Airex, feet apart, eyes closed; -*Unable to maintain feet together/standing Romberg today Standing Romberg; 3x30sec Standing on Airex with feet together; 2x30sec       Therapeutic Exercise - improved strength as needed to improve performance of CKC activities/functional movements and as needed for improved power production to prevent fall during episode of large postural perturbation     Ambulate laps in hallway; x 2 laps length of hall (70 feet); with SPC RUE, with verbal cueing for heel strike and decreased excessive toe-out   *next visit* Sit to stand with 4-lb [place] hold, from chair in gym; 2x8     *not  today* In // bars: Standing heel raise/toe raise, alternating; 2x10  Seated hip abduction, black Tband; 2x10  Seated march; 2x10 [heaviness with lifting LLE] LAQ; 2x10; 5-lb cuff weight, bilateral LE      *not today* MHP (unbilled) utilized pre-treatment for analgesic effect and improved soft tissue extensibility and to improve participation in active PT intervention, along low back in sitting; x 5 minutes          ASSESSMENT Patient has minor increase in symptoms with VOR cancellation and horizontal VOR x 1 in standing. She has ongoing gait stability deficits with intermittent cross-over pattern with RLE. She is awaiting further follow-up with neurologist to address persistent dizziness (persistent dizziness was present prior to ACDF). She needs further work on balance, gait, and lower  limb strength for full recovery of function following cervical spine fusion. Patient has remaining deficits in LE strength, postural stability, gait changes, mild UE coordination deficits, dec cervical spine AROM (s/p fusion, full AROM not expected), impaired smooth pursuit (upward/downward) and saccades, impaired VOR cancellation, and difficulty with transferring and stair/obstacle negotiation.  Patient will benefit from continued skilled therapeutic intervention to address the above deficits as needed for improved function and QoL.       PT Short Term Goals - 07/30/21 1426       PT SHORT TERM GOAL #1   Title Pt will be independent with HEP in order to improve strength and balance in order to decrease fall risk and improve function at home and work.    Baseline 04/15/21: HEP initiated.   05/19/21: Pt is complaint with HEP given in PT, initiating home exercises from surgeon this week.   06/29/21: compliant and independent with hom exercise program.    Time 3    Period Days    Status Achieved    Target Date 05/06/21      PT SHORT TERM GOAL #2   Title Patient will demonstrate modified independent gait with SPC for home-mobility distance (150 ft or greater) in clinic with no LOB and safe management of AD    Baseline 04/15/21: Patient using rollator as primary means of mobility.   05/19/21: Pt still using rollator as primary means of mobility; she has demonstrated gait in parallel bars with no AD with intermittent LOB requiring minA to maintain balance; will practice SPC ambulation next visit.   06/29/21: Patient ambulates with SPC with intermittent ModA required to prevent LOB for home mobility distance.  07/29/21: Patient requires CGA with use of SPC with ModA intermittently to prevent lateral staggering while rounding corner.    Time 4    Period Weeks    Status Partially Met    Target Date 05/13/21               PT Long Term Goals - 07/29/21 1447       PT LONG TERM GOAL #1   Title Pt will  improve BERG by at least 3 points in order to demonstrate clinically significant improvement in balance.    Baseline 04/15/21: Baseline BERG to be obtained next visit.   04/19/21: 35/56.  05/18/21: 40/56   06/29/21: 38/56.  07/29/21: 41/56    Time 8    Period Weeks    Status Achieved    Target Date 05/18/21      PT LONG TERM GOAL #2   Title Pt will decrease 5TSTS by at least 3 seconds in order to demonstrate clinically significant improvement in LE strength.    Baseline 04/15/21: 5TSTS  31 sec.   05/19/21: Improved by 4 seconds.   06/29/21: 5TSTS improved by 8 seconds relative to initial eval (23 sec).  07/29/21: Improved by 10 seconds relative to initial eval (21 sec).    Time 8    Period Weeks    Status Achieved    Target Date 05/18/21      PT LONG TERM GOAL #3   Title Pt will decrease TUG to below 14 seconds/decrease in order to demonstrate decreased fall risk.    Baseline 04/15/21: TUG 25 sec.   05/18/21: TUG 22.6 sec.   06/29/21:  TUG 20.1 sec   07/29/21: TUG 18.3 sec    Time 8    Period Weeks    Status Partially Met    Target Date 06/10/21      PT LONG TERM GOAL #4   Title Patient will demonstrate improved function as evidenced by a score of 64 on FOTO measure for full participation in activities at home and in the community.    Baseline 04/15/21: FOTO 53.   05/18/21: FOTO 58.   06/29/21: FOTO 60.  07/29/21: FOTO 60.    Time 8    Period Weeks    Status Not Met    Target Date 06/10/21                   Plan - 08/16/21 1642     Clinical Impression Statement Patient has minor increase in symptoms with VOR cancellation and horizontal VOR x 1 in standing. She has ongoing gait stability deficits with intermittent cross-over pattern with RLE. She is awaiting further follow-up with neurologist to address persistent dizziness (persistent dizziness was present prior to ACDF). She needs further work on balance, gait, and lower limb strength for full recovery of function following cervical  spine fusion. Patient has remaining deficits in LE strength, postural stability, gait changes, mild UE coordination deficits, dec cervical spine AROM (s/p fusion, full AROM not expected), impaired smooth pursuit (upward/downward) and saccades, impaired VOR cancellation, and difficulty with transferring and stair/obstacle negotiation.  Patient will benefit from continued skilled therapeutic intervention to address the above deficits as needed for improved function and QoL.    Personal Factors and Comorbidities Age;Comorbidity 3+    Comorbidities chronic kidney disease, Type II DM, peripheral neuropathy, hypertension, Hx of A-fib, cervical spine stenosis s/p C3-6 fusion    Examination-Activity Limitations Bed Mobility;Stairs;Stand;Locomotion Level;Transfers;Bend;Dressing    Examination-Participation Restrictions Community Activity;Cleaning    Stability/Clinical Decision Making Evolving/Moderate complexity    Rehab Potential Good    PT Frequency 2x / week    PT Duration 6 weeks    PT Treatment/Interventions Gait training;Stair training;Functional mobility training;Therapeutic activities;Therapeutic exercise;Balance training;Neuromuscular re-education;Patient/family education;ADLs/Self Care Home Management;Cryotherapy;Electrical Stimulation;Moist Heat    PT Next Visit Plan Continue with gait and balance re-training, LE strengthening. UE coordination work in sitting/standing. Pt continuing with cervical AROM drills given by surgeon; may use MT for paracervical sensitivity and soft tissue mobility as needed. Recommend continued PT 2x/week for 4 weeks    PT Home Exercise Plan Access Code: [email or web address]    Consulted and Agree with Plan of Care Patient             Patient will benefit from skilled therapeutic intervention in order to improve the following deficits and impairments:  Abnormal gait, Postural dysfunction, Difficulty walking, Decreased balance, Decreased coordination, Decreased strength, Decreased  activity tolerance  Visit Diagnosis: Difficulty in walking, not elsewhere classified  Dizziness and giddiness  Imbalance  Muscle weakness (  generalized)  S/P cervical spinal fusion     Problem List Patient Active Problem List   Diagnosis Date Noted   Cervical myelopathy ([place]) 03/24/2021   Anemia 12/03/2020   Essential hypertension 12/03/2020   Hypothyroidism 12/03/2020   Type 2 diabetes mellitus ([place]) 12/03/2020   Atrial fibrillation ([place]) 09/21/2018   Kidney transplant status 09/21/2018   Osteoporosis 09/21/2018   Valentina Gu, PT, DPT #[id number]  Eilleen Kempf, PT 08/16/2021, 4:42 PM  [facility] [address] [place], [place], [address] Phone: [phone]   Fax:  [phone]  Name: Marrietta Thunder MRN: [id number] Date of Birth: 01-12-1951

## 2021-08-17 ENCOUNTER — Ambulatory Visit: Payer: Medicare Other | Admitting: Physical Therapy

## 2021-08-19 ENCOUNTER — Ambulatory Visit: Payer: Medicare Other | Admitting: Physical Therapy

## 2021-08-19 ENCOUNTER — Other Ambulatory Visit: Payer: Self-pay

## 2021-08-19 DIAGNOSIS — R2689 Other abnormalities of gait and mobility: Secondary | ICD-10-CM

## 2021-08-19 DIAGNOSIS — R262 Difficulty in walking, not elsewhere classified: Secondary | ICD-10-CM | POA: Diagnosis not present

## 2021-08-19 DIAGNOSIS — R42 Dizziness and giddiness: Secondary | ICD-10-CM

## 2021-08-19 DIAGNOSIS — M6281 Muscle weakness (generalized): Secondary | ICD-10-CM

## 2021-08-19 DIAGNOSIS — Z981 Arthrodesis status: Secondary | ICD-10-CM

## 2021-08-19 NOTE — Therapy (Signed)
[facility] [address] [place], [place], [address] Phone: [phone]   Fax:  [phone]  Physical Therapy Treatment  Patient Details  Name: Jean Johnson MRN: [id number] Date of Birth: 02-07-1951 Referring Provider (PT): Meade Maw, MD   Encounter Date: 08/19/2021   PT End of Session - 08/21/21 0900     Visit Number 27    Number of Visits 30    Date for PT Re-Evaluation 08/19/21    Authorization Type Medicare and [employer] - VL based on medical necessity, no auth required    Authorization Time Period initial eval 04/15/21, last progress note 07/29/21; Cert 08/13/53-9/74/16    PT Start Time 1425    PT Stop Time 1510    PT Time Calculation (min) 45 min    Equipment Utilized During Treatment Gait belt   rollator for negotiating clinic   Activity Tolerance Patient tolerated treatment well    Behavior During Therapy WFL for tasks assessed/performed              Past Medical History:  Diagnosis Date   Anemia of chronic renal disease    Aortic stenosis, mild    a.) TTE on 11/23/2020 --> mean gradient 9.7 mmHg   Atrial fibrillation (HCC)    a.) CHA2DS2-VASc = 3 (age, sex, diabetes). b.) daily apixaban   B12 deficiency    Cervical spinal stenosis    Chronic anticoagulation    Apixaban   Diabetic neuropathy (HCC)    Diverticulosis    Dyspnea    ESRD (end stage renal disease) (HCC)    First degree AV block    HLD (hyperlipidemia)    Hypertension    Incomplete right bundle branch block (RBBB)    LAE (left atrial enlargement)    a.) TTE on 11/23/2020 --> moderate   Left thyroid nodule    a.) Cervical MRI on 12/29/2020 --> measured "at least" 3.5 cm; imcompletely imaged.   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   Valvular heart disease    a.) TTE 11/23/2020 --> mild panvalvular regurgitation    Past Surgical History:  Procedure Laterality Date   ANTERIOR CERVICAL DECOMP/DISCECTOMY FUSION N/A 03/24/2021   Procedure: C3-6 ANTERIOR CERVICAL DECOMPRESSION/DISCECTOMY FUSION 3 LEVELS;  Surgeon: Meade Maw, MD;  Location: [facility];  Service: Neurosurgery;  Laterality: N/A;   CATARACT EXTRACTION W/PHACO Left 10/21/2020   Procedure: CATARACT EXTRACTION PHACO AND INTRAOCULAR LENS PLACEMENT ([place]) DIABETES LEFT;  Surgeon: Leandrew Koyanagi, MD;  Location: [place];  Service: Ophthalmology;  Laterality: Left;  2.84 0:48.8 5.8%   EYE SURGERY Right 2020   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   Subjective Assessment - 08/21/21 0859     Subjective Patient reports no updates from neurologist recently - she states she is following up soon. She is getting additional bloodwork on top of previously completed bloodwork to check for abnormal findings. Patient reports  8/10 dizziness at arrival to PT today. Patient reports still having remaining deficits with gait and balance at this time.    Pertinent History Patient is a 71 year old female previously seen for dizziness and imbalance, currently s/p C3-6 ACDF (DOS: 03/24/21). No significant complications post-op. She reports some irritation recently along upper periscapular region. Patient is currently donning neck brace until next follow-up with her surgeon. She is refraining from cervical AROM per instructions from physician.  Patient reports using brace the entire day except for  completing hygiene tasks, eating, and sleeping. Patient currently has precautions for lifting > one gallon per report; physician's notes state no lifting > 10 pounds in first 6 weeks. Pt is precautioned from bending and stooping at this time. Patient does have remaining neuropathic symptoms on RUE/RLE. Patient reports no notable sciatic-type symptoms at this time. Patient reports no more urinary urgency/frequency than usual; she does occasionally feel hurried to get to the bathroom. Patient reports no fever, chills, night sweats. Pt has worked on gait with home health; she has walked as far 208 feet. She reports significant difficulty with stair negotiation at this time. Patient reports no stairs to enter/exit home. She has to negotiate 14 steps to get to her bedroom - handrail on L side. Pt has tub shower with shower bench; no grab bars. Pt has to negotiate gravel to get into her home. Pt reports having intermittent slight dizziness, but "not that much" recently.    Limitations Walking;House hold activities;Standing    Diagnostic tests Intraoperative fluoroscopy - no post-op imaging yet    Patient Stated Goals Able to walk with improved steadiness              TREATMENT     Neuromuscular Re-education - for static and dynamic postural stability as needed to improve balance with performance of ADLs and household/community mobility tasks, equilibrium and non-equilibrium coordination to improve motor control with standing activities     In // bars: -Forward heel-to-toe walking and retro step; 3x D/Bx [verbal cueing for heel strike at initial contact] -Sidestepping; 3x D/B length of parallel bars  -Standing VOR x 1 (slow cadence); 3x12; horiz and vert, feet together on floor -Standing narrow BOS, eyes closed; 1x10 sec, 1x15 sec, 1x20sec ( 3 cm apart) -VOR cancellation with visual target en bloc; using thumb for target; 2x30 seconds in standing       *not today* -Standing feet apart, eyes  closed; 1x20sec, 1x60sec -Gait in hallway with visual scanning and identifying targets ; x 2 D/B (identifying letters and numbers on wall) -Gait with head turns in hallway, with rollator; x2 D/B; multi-directional cueing for up/down/left/right -Standing saccades, feet together, horizontal and vertical; 1x23 vertical, 3x12 horizontal -Standing toe tap; 2x10 alternating, 6-inch step -Hurdle step focusing on heel strike at initial contact, holding bilateral parallel bars; x10 on each LE -Forward stepping along parallel bars with yellow band around lower limbs to resist adduction; 3x D/B -Standing on Airex, feet apart, eyes closed; -*Unable to maintain feet together/standing Romberg today Standing Romberg; 3x30sec Standing on Airex with feet together; 2x30sec       Therapeutic Exercise - improved strength as needed to improve performance of CKC activities/functional movements and as needed for improved power production to prevent fall during episode of large postural perturbation   Ambulate laps in hallway; x 2 laps length of hall (70 feet); with SPC RUE, with verbal cueing for heel strike and decreased excessive toe-out  Sit to stand with 4-lb Goblet hold, from  chair in gym; 2x8   Pt edu: HEP update and review    *not today* In // bars: Standing heel raise/toe raise, alternating; 2x10  Seated hip abduction, black Tband; 2x10  Seated march; 2x10 [heaviness with lifting LLE] LAQ; 2x10; 5-lb cuff weight, bilateral LE       *not today* MHP (unbilled) utilized pre-treatment for analgesic effect and improved soft tissue extensibility and to improve participation in active PT intervention, along low back in sitting; x 5 minutes          ASSESSMENT Patient has ongoing high level of dizziness reported at similar level to that reported the previous week. She has ongoing significant gait impairment with noted RLE>LLE cross-over pattern during swing phase and difficulty maintaining consistent  forward progression during gait. Pt has ataxic pattern with decreased opposite reciprocal arm swing and decreased step cadence and gait velocity. In spite of prolonged time in PT with aggressive work on static and dynamic balance and vestibular re-training, pt still has significant limitations in regard to postural control and fall risk. Pt is continuing follow-up with physician regarding ongoing dizziness. Patient does have multiple comorbidities that can be contributing to her current condition. Patient has remaining deficits in LE strength, postural stability, gait changes, mild UE coordination deficits, dec cervical spine AROM (s/p fusion, full AROM not expected), impaired smooth pursuit (upward/downward) and saccades, impaired VOR cancellation, and difficulty with transferring and stair/obstacle negotiation.  Patient will benefit from continued skilled therapeutic intervention to address the above deficits as needed for improved function and QoL.       PT Short Term Goals - 07/30/21 1426       PT SHORT TERM GOAL #1   Title Pt will be independent with HEP in order to improve strength and balance in order to decrease fall risk and improve function at home and work.    Baseline 04/15/21: HEP initiated.   05/19/21: Pt is complaint with HEP given in PT, initiating home exercises from surgeon this week.   06/29/21: compliant and independent with hom exercise program.    Time 3    Period Days    Status Achieved    Target Date 05/06/21      PT SHORT TERM GOAL #2   Title Patient will demonstrate modified independent gait with SPC for home-mobility distance (150 ft or greater) in clinic with no LOB and safe management of AD    Baseline 04/15/21: Patient using rollator as primary means of mobility.   05/19/21: Pt still using rollator as primary means of mobility; she has demonstrated gait in parallel bars with no AD with intermittent LOB requiring minA to maintain balance; will practice SPC ambulation next  visit.   06/29/21: Patient ambulates with SPC with intermittent ModA required to prevent LOB for home mobility distance.  07/29/21: Patient requires CGA with use of SPC with ModA intermittently to prevent lateral staggering while rounding corner.    Time 4    Period Weeks    Status Partially Met    Target Date 05/13/21               PT Long Term Goals - 07/29/21 1447       PT LONG TERM GOAL #1   Title Pt will improve BERG by at least 3 points in order to demonstrate clinically significant improvement in balance.    Baseline 04/15/21: Baseline BERG to be obtained next visit.   04/19/21: 35/56.  05/18/21: 40/56   06/29/21: 33/29.  07/29/21:  41/56    Time 8    Period Weeks    Status Achieved    Target Date 05/18/21      PT LONG TERM GOAL #2   Title Pt will decrease 5TSTS by at least 3 seconds in order to demonstrate clinically significant improvement in LE strength.    Baseline 04/15/21: 5TSTS 31 sec.   05/19/21: Improved by 4 seconds.   06/29/21: 5TSTS improved by 8 seconds relative to initial eval (23 sec).  07/29/21: Improved by 10 seconds relative to initial eval (21 sec).    Time 8    Period Weeks    Status Achieved    Target Date 05/18/21      PT LONG TERM GOAL #3   Title Pt will decrease TUG to below 14 seconds/decrease in order to demonstrate decreased fall risk.    Baseline 04/15/21: TUG 25 sec.   05/18/21: TUG 22.6 sec.   06/29/21:  TUG 20.1 sec   07/29/21: TUG 18.3 sec    Time 8    Period Weeks    Status Partially Met    Target Date 06/10/21      PT LONG TERM GOAL #4   Title Patient will demonstrate improved function as evidenced by a score of 64 on FOTO measure for full participation in activities at home and in the community.    Baseline 04/15/21: FOTO 53.   05/18/21: FOTO 58.   06/29/21: FOTO 60.  07/29/21: FOTO 60.    Time 8    Period Weeks    Status Not Met    Target Date 06/10/21                   Plan - 08/21/21 0907     Clinical Impression Statement  Patient has ongoing high level of dizziness reported at similar level to that reported the previous week. She has ongoing significant gait impairment with noted RLE>LLE cross-over pattern during swing phase and difficulty maintaining consistent forward progression during gait. Pt has ataxic pattern with decreased opposite reciprocal arm swing and decreased step cadence and gait velocity. In spite of prolonged time in PT with aggressive work on static and dynamic balance and vestibular re-training, pt still has significant limitations in regard to postural control and fall risk. Pt is continuing follow-up with physician regarding ongoing dizziness. Patient does have multiple comorbidities that can be contributing to her current condition. Patient has remaining deficits in LE strength, postural stability, gait changes, mild UE coordination deficits, dec cervical spine AROM (s/p fusion, full AROM not expected), impaired smooth pursuit (upward/downward) and saccades, impaired VOR cancellation, and difficulty with transferring and stair/obstacle negotiation.  Patient will benefit from continued skilled therapeutic intervention to address the above deficits as needed for improved function and QoL.    Personal Factors and Comorbidities Age;Comorbidity 3+    Comorbidities chronic kidney disease, Type II DM, peripheral neuropathy, hypertension, Hx of A-fib, cervical spine stenosis s/p C3-6 fusion    Examination-Activity Limitations Bed Mobility;Stairs;Stand;Locomotion Level;Transfers;Bend;Dressing    Examination-Participation Restrictions Community Activity;Cleaning    Stability/Clinical Decision Making Evolving/Moderate complexity    Rehab Potential Good    PT Frequency 2x / week    PT Duration 6 weeks    PT Treatment/Interventions Gait training;Stair training;Functional mobility training;Therapeutic activities;Therapeutic exercise;Balance training;Neuromuscular re-education;Patient/family education;ADLs/Self Care  Home Management;Cryotherapy;Electrical Stimulation;Moist Heat    PT Next Visit Plan Continue with gait and balance re-training, LE strengthening. UE coordination work in sitting/standing. Pt continuing with cervical AROM drills given by  surgeon; may use MT for paracervical sensitivity and soft tissue mobility as needed. Recommend continued PT 2x/week for 4 weeks    PT Home Exercise Plan Access Code: [email or web address]    Consulted and Agree with Plan of Care Patient             Patient will benefit from skilled therapeutic intervention in order to improve the following deficits and impairments:  Abnormal gait, Postural dysfunction, Difficulty walking, Decreased balance, Decreased coordination, Decreased strength, Decreased activity tolerance  Visit Diagnosis: Difficulty in walking, not elsewhere classified  Dizziness and giddiness  Imbalance  Muscle weakness (generalized)  S/P cervical spinal fusion     Problem List Patient Active Problem List   Diagnosis Date Noted   Cervical myelopathy ([place]) 03/24/2021   Anemia 12/03/2020   Essential hypertension 12/03/2020   Hypothyroidism 12/03/2020   Type 2 diabetes mellitus ([place]) 12/03/2020   Atrial fibrillation ([place]) 09/21/2018   Kidney transplant status 09/21/2018   Osteoporosis 09/21/2018   Valentina Gu, PT, DPT #[id number]  Eilleen Kempf, PT 08/21/2021, 9:16 AM  [facility] [address]. [place], [place], [address] Phone: [phone]   Fax:  [phone]  Name: Tamakia Porto MRN: [id number] Date of Birth: 1951-07-11

## 2021-08-21 ENCOUNTER — Encounter: Payer: Self-pay | Admitting: Physical Therapy

## 2021-08-24 ENCOUNTER — Encounter: Payer: Medicare Other | Admitting: Physical Therapy

## 2021-08-26 ENCOUNTER — Encounter: Payer: 59 | Admitting: Physical Therapy

## 2021-08-31 ENCOUNTER — Encounter: Payer: 59 | Admitting: Physical Therapy

## 2021-09-02 ENCOUNTER — Encounter: Payer: Self-pay | Admitting: *Deleted

## 2021-09-02 ENCOUNTER — Ambulatory Visit: Payer: Medicare Other | Admitting: Physical Therapy

## 2021-09-03 ENCOUNTER — Encounter
Admission: RE | Disposition: A | Discharge status: Home / Self Care | Payer: Self-pay | Source: Home / Self Care | Attending: Gastroenterology

## 2021-09-03 ENCOUNTER — Other Ambulatory Visit: Payer: Self-pay | Admitting: Gastroenterology

## 2021-09-03 ENCOUNTER — Ambulatory Visit
Admission: RE | Admit: 2021-09-03 | Discharge: 2021-09-03 | Disposition: A | Discharge status: Home / Self Care | Payer: Medicare Other | Attending: Gastroenterology | Admitting: Gastroenterology

## 2021-09-03 ENCOUNTER — Ambulatory Visit: Payer: Medicare Other | Admitting: Anesthesiology

## 2021-09-03 DIAGNOSIS — E1140 Type 2 diabetes mellitus with diabetic neuropathy, unspecified: Secondary | ICD-10-CM | POA: Diagnosis not present

## 2021-09-03 DIAGNOSIS — K5730 Diverticulosis of large intestine without perforation or abscess without bleeding: Secondary | ICD-10-CM | POA: Insufficient documentation

## 2021-09-03 DIAGNOSIS — I120 Hypertensive chronic kidney disease with stage 5 chronic kidney disease or end stage renal disease: Secondary | ICD-10-CM | POA: Insufficient documentation

## 2021-09-03 DIAGNOSIS — E785 Hyperlipidemia, unspecified: Secondary | ICD-10-CM | POA: Insufficient documentation

## 2021-09-03 DIAGNOSIS — K6289 Other specified diseases of anus and rectum: Secondary | ICD-10-CM | POA: Diagnosis present

## 2021-09-03 DIAGNOSIS — N186 End stage renal disease: Secondary | ICD-10-CM | POA: Insufficient documentation

## 2021-09-03 DIAGNOSIS — E11649 Type 2 diabetes mellitus with hypoglycemia without coma: Secondary | ICD-10-CM | POA: Insufficient documentation

## 2021-09-03 DIAGNOSIS — Z7901 Long term (current) use of anticoagulants: Secondary | ICD-10-CM | POA: Insufficient documentation

## 2021-09-03 DIAGNOSIS — I4891 Unspecified atrial fibrillation: Secondary | ICD-10-CM | POA: Insufficient documentation

## 2021-09-03 DIAGNOSIS — Z794 Long term (current) use of insulin: Secondary | ICD-10-CM | POA: Insufficient documentation

## 2021-09-03 DIAGNOSIS — E538 Deficiency of other specified B group vitamins: Secondary | ICD-10-CM | POA: Diagnosis not present

## 2021-09-03 DIAGNOSIS — K603 Anal fistula: Secondary | ICD-10-CM

## 2021-09-03 DIAGNOSIS — E1122 Type 2 diabetes mellitus with diabetic chronic kidney disease: Secondary | ICD-10-CM | POA: Insufficient documentation

## 2021-09-03 DIAGNOSIS — Z940 Kidney transplant status: Secondary | ICD-10-CM | POA: Diagnosis not present

## 2021-09-03 DIAGNOSIS — K648 Other hemorrhoids: Secondary | ICD-10-CM | POA: Diagnosis not present

## 2021-09-03 DIAGNOSIS — K620 Anal polyp: Secondary | ICD-10-CM | POA: Insufficient documentation

## 2021-09-03 HISTORY — PX: COLONOSCOPY WITH PROPOFOL: SHX5780

## 2021-09-03 LAB — GLUCOSE, CAPILLARY
Glucose-Capillary: 141 mg/dL — ABNORMAL HIGH (ref 70–99)
Glucose-Capillary: 64 mg/dL — ABNORMAL LOW (ref 70–99)
Glucose-Capillary: 90 mg/dL (ref 70–99)

## 2021-09-03 SURGERY — COLONOSCOPY WITH PROPOFOL
Anesthesia: General

## 2021-09-03 MED ORDER — SODIUM CHLORIDE 0.9 % IV SOLN
INTRAVENOUS | Status: DC
  Administered 2021-09-03: 20 mL/h via INTRAVENOUS

## 2021-09-03 MED ORDER — DEXTROSE 50 % IV SOLN
INTRAVENOUS | Status: AC
  Administered 2021-09-03: 25 mL
  Filled 2021-09-03: qty 50

## 2021-09-03 MED ORDER — PROPOFOL 500 MG/50ML IV EMUL
INTRAVENOUS | Status: DC | PRN
  Administered 2021-09-03: 150 ug/kg/min via INTRAVENOUS

## 2021-09-03 MED ORDER — LIDOCAINE HCL (CARDIAC) PF 100 MG/5ML IV SOSY
PREFILLED_SYRINGE | INTRAVENOUS | Status: DC | PRN
  Administered 2021-09-03: 50 mg via INTRAVENOUS

## 2021-09-03 MED ORDER — PROPOFOL 10 MG/ML IV BOLUS
INTRAVENOUS | Status: DC | PRN
Start: 2021-09-03 — End: 2021-09-03
  Administered 2021-09-03: 60 mg via INTRAVENOUS

## 2021-09-03 NOTE — Anesthesia Preprocedure Evaluation (Signed)
Anesthesia Evaluation  Patient identified by MRN, date of birth, ID band Patient awake    Reviewed: Allergy & Precautions, NPO status , Patient's Chart, lab work & pertinent test results  History of Anesthesia Complications (+) PONV and history of anesthetic complications  Airway Mallampati: III  TM Distance: >3 FB Neck ROM: full    Dental  (+) Chipped, Missing, Poor Dentition, Loose   Pulmonary    Pulmonary exam normal        Cardiovascular hypertension, (-) anginaNormal cardiovascular exam+ dysrhythmias + Valvular Problems/Murmurs      Neuro/Psych negative neurological ROS  negative psych ROS   GI/Hepatic negative GI ROS, Neg liver ROS,   Endo/Other  negative endocrine ROSdiabetes, Type 2, Insulin DependentHypothyroidism   Renal/GU Renal diseasenegative Renal ROS  negative genitourinary   Musculoskeletal   Abdominal   Peds  Hematology negative hematology ROS (+)   Anesthesia Other Findings Treating her hypoglycemia with D50 preOp  Past Medical History: No date: Anemia of chronic renal disease No date: Aortic stenosis, mild     Comment:  a.) TTE on 11/23/2020 --> mean gradient 9.7 mmHg No date: Atrial fibrillation (HCC)     Comment:  a.) CHA2DS2-VASc = 3 (age, sex, diabetes). b.) daily               apixaban No date: B12 deficiency No date: Cervical spinal stenosis No date: Chronic anticoagulation     Comment:  Apixaban No date: Diabetic neuropathy (HCC) No date: Diverticulosis No date: Dyspnea No date: ESRD (end stage renal disease) (HCC) No date: First degree AV block No date: HLD (hyperlipidemia) No date: Hypertension No date: Incomplete right bundle branch block (RBBB) No date: LAE (left atrial enlargement)     Comment:  a.) TTE on 11/23/2020 --> moderate No date: Left thyroid nodule     Comment:  a.) Cervical MRI on 12/29/2020 --> measured "at least"               3.5 cm; imcompletely  imaged. 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 b.)              cadaveric organ recipient on 02/04/2009; located in LEFT               lower abdominal quadrant. No date: Valvular heart disease     Comment:  a.) TTE 11/23/2020 --> mild panvalvular regurgitation  Past Surgical History: 03/24/2021: ANTERIOR CERVICAL DECOMP/DISCECTOMY FUSION; N/A     Comment:  Procedure: C3-6 ANTERIOR CERVICAL               DECOMPRESSION/DISCECTOMY FUSION 3 LEVELS;  Surgeon:               Meade Maw, MD;  Location: [facility];  Service:               Neurosurgery;  Laterality: N/A; 10/21/2020: CATARACT EXTRACTION W/PHACO; Left     Comment:  Procedure: CATARACT EXTRACTION PHACO AND INTRAOCULAR               LENS PLACEMENT ([place]) DIABETES  LEFT;  Surgeon: Leandrew Koyanagi, MD;  Location: [place];  Service:               Ophthalmology;  Laterality: Left;  2.84 0:48.8 5.8% 2020: EYE SURGERY; Right 12/26/1998: KIDNEY TRANSPLANT; Left     Comment:  Living donor organ recipient (sister) 02/04/2009: KIDNEY TRANSPLANT; Left     Comment:  Cadaveric organ recipient  BMI    Body Mass Index: 21.73 kg/m      Reproductive/Obstetrics negative OB ROS                             Anesthesia Physical Anesthesia Plan  ASA: 3  Anesthesia Plan: General   Post-op Pain Management:    Induction: Intravenous  PONV Risk Score and Plan: Propofol infusion and TIVA  Airway Management Planned: Natural Airway and Nasal Cannula  Additional  Equipment:   Intra-op Plan:   Post-operative Plan:   Informed Consent: I have reviewed the patients History and Physical, chart, labs and discussed the procedure including the risks, benefits and alternatives for the proposed anesthesia with the patient or authorized representative who has indicated his/her understanding and acceptance.     Dental Advisory Given  Plan Discussed with: Anesthesiologist, CRNA and Surgeon  Anesthesia Plan Comments: (Patient consented for risks of anesthesia including but not limited to:  - adverse reactions to medications - risk of airway placement if required - damage to eyes, teeth, lips or other oral mucosa - nerve damage due to positioning  - sore throat or hoarseness - Damage to heart, brain, nerves, lungs, other parts of body or loss of life  Patient voiced understanding.)        Anesthesia Quick Evaluation

## 2021-09-03 NOTE — Anesthesia Postprocedure Evaluation (Signed)
Anesthesia Post Note  Patient: Jean Johnson  Procedure(s) Performed: COLONOSCOPY WITH PROPOFOL  Patient location during evaluation: Endoscopy Anesthesia Type: General Level of consciousness: awake and alert Pain management: pain level controlled Vital Signs Assessment: post-procedure vital signs reviewed and stable Respiratory status: spontaneous breathing, nonlabored ventilation, respiratory function stable and patient connected to nasal cannula oxygen Cardiovascular status: blood pressure returned to baseline and stable Postop Assessment: no apparent nausea or vomiting Anesthetic complications: no   No notable events documented.   Last Vitals:  Vitals:   09/03/21 0949 09/03/21 0954  BP: (!) 173/82 (!) 159/72  Pulse:    Resp: (!) 27   Temp:    SpO2:      Last Pain:  Vitals:   09/03/21 0954  TempSrc:   PainSc: 0-No pain                 Precious Haws Giordano Getman

## 2021-09-03 NOTE — H&P (Signed)
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 Had colonoscopy at [facility] in [place] with some polyps removed. She takes eliquis with last dose 3 days ago. No family history of GI malignancies.     Current Facility-Administered Medications:    0.9 %  sodium chloride infusion, , Intravenous, Continuous, Sebrina Kessner, Hilton Cork, MD, Last Rate: 20 mL/hr at 09/03/21 0847, 20 mL/hr at 09/03/21 0847  Medications Prior to Admission  Medication Sig Dispense Refill Last Dose   apixaban (ELIQUIS) 5 MG TABS tablet Take 5 mg by mouth 2 (two) times daily.   09/02/2021   Artificial Tear Ointment (DRY EYES OP) Place 1 drop into both eyes daily as needed (blurry Vision or floater in eyes).   09/02/2021   atorvastatin (LIPITOR) 10 MG tablet Take 10 mg by mouth daily.   09/02/2021   cyanocobalamin 2000 MCG tablet Take 2,000 mcg by mouth daily.   09/02/2021   diltiazem (CARDIZEM CD) 240 MG 24 hr capsule Take 240 mg by mouth at bedtime.   09/03/2021 at 0630   ezetimibe (ZETIA) 10 MG tablet Take 10 mg by mouth daily.   09/02/2021   flecainide (TAMBOCOR) 100 MG tablet Take 100 mg by mouth every 12 (twelve) hours.   09/02/2021   insulin detemir (LEVEMIR) 100 UNIT/ML injection Inject 8 Units into the skin at bedtime.   09/02/2021   insulin lispro (HUMALOG) 100 UNIT/ML KwikPen Inject 0-5 Units into the skin in the morning and at bedtime. Sliding scale   Past Week   lisinopril (ZESTRIL) 30 MG tablet Take 30 mg by mouth daily.   09/02/2021   methocarbamol (ROBAXIN) 500 MG tablet Take 1 tablet (500 mg total) by mouth every 6 (six) hours as needed for muscle spasms. 90 tablet 0 09/02/2021   mycophenolate (CELLCEPT) 500 MG tablet Take 500 mg by  mouth 2 (two) times daily.   09/02/2021   Potassium Chloride ER 20 MEQ TBCR Take 20 mEq by mouth 3 (three) times daily.   09/02/2021   predniSONE (DELTASONE) 5 MG tablet Take 5 mg by mouth daily with breakfast.   09/02/2021   pregabalin (LYRICA) 25 MG capsule Take 50 mg by mouth 2 (two) times daily.   09/02/2021   senna (SENOKOT) 8.6 MG TABS tablet Take 1 tablet (8.6 mg total) by mouth 2 (two) times daily. 120 tablet 0 09/02/2021   tacrolimus (PROGRAF) 1 MG capsule Take 3 mg by mouth 2 (two) times daily.   09/02/2021     Allergies  Allergen Reactions   E-Mycin [Erythromycin] Swelling    And itching    Penicillins Swelling    Throat      Past Medical History:  Diagnosis Date   Anemia of chronic renal disease    Aortic stenosis, mild    a.) TTE on 11/23/2020 --> mean gradient 9.7 mmHg   Atrial fibrillation (HCC)    a.) CHA2DS2-VASc = 3 (age, sex, diabetes). b.) daily apixaban   B12 deficiency    Cervical spinal stenosis    Chronic anticoagulation    Apixaban   Diabetic neuropathy (HCC)    Diverticulosis    Dyspnea    ESRD (end stage renal disease) (HCC)    First degree  AV block    HLD (hyperlipidemia)    Hypertension    Incomplete right bundle branch block (RBBB)    LAE (left atrial enlargement)    a.) TTE on 11/23/2020 --> moderate   Left thyroid nodule    a.) Cervical MRI on 12/29/2020 --> measured "at least" 3.5 cm; imcompletely imaged.   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   Valvular heart disease    a.) TTE 11/23/2020 --> mild panvalvular  regurgitation    Review of systems:  Otherwise negative.    Physical Exam  Gen: Alert, oriented. Appears stated age.  HEENT: PERRLA. Lungs: No respiratory distress CV: RRR Abd: soft, benign, no masses Ext: No edema    Planned procedures: Proceed with colonoscopy. The patient understands the nature of the planned procedure, indications, risks, alternatives and potential complications including but not limited to bleeding, infection, perforation, damage to internal organs and possible oversedation/side effects from anesthesia. The patient agrees and gives consent to proceed.  Please refer to procedure notes for findings, recommendations and patient disposition/instructions.     Jean Rubenstein, MD [facility]

## 2021-09-03 NOTE — Interval H&P Note (Signed)
History and Physical Interval Note:  09/03/2021 9:03 AM  Jean Johnson  has presented today for surgery, with the diagnosis of Perianal fistula.  The various methods of treatment have been discussed with the patient and family. After consideration of risks, benefits and other options for treatment, the patient has consented to  Procedure(s) with comments: COLONOSCOPY WITH PROPOFOL (N/A) - DM, ELIQUIS, WHEELCHAIR as a surgical intervention.  The patient's history has been reviewed, patient examined, no change in status, stable for surgery.  I have reviewed the patient's chart and labs.  Questions were answered to the patient's satisfaction.     Lesly Rubenstein  Ok to proceed with colonoscopy

## 2021-09-03 NOTE — Op Note (Signed)
[facility]astroenter[facility] Patient Name: Jean Johnson Procedure Date: 09/03/2021 9:05 AM MRN: [id number] Account #: [id number] Date of Birth: 1950-07-16 Admit Type: Outpatient Age: 71 Room: [facility] ENDO ROOM 3 Gender: Female Note Status: Finalized Instrument Name: Park Meo [id number] Procedure:             Colonoscopy Indications:           Anal mass Providers:             Andrey Farmer MD, MD Referring MD:          No Local Md, MD (Referring MD) Medicines:             Monitored Anesthesia Care Complications:         No immediate complications. Estimated blood loss:                         Minimal. Procedure:             Pre-Anesthesia Assessment:                        - Prior to the procedure, a History and Physical was                         performed, and patient medications and allergies were                         reviewed. The patient is competent. The risks and                         benefits of the procedure and the sedation options and                         risks were discussed with the patient. All questions                         were answered and informed consent was obtained.                         Patient identification and proposed procedure were                         verified by the physician, the nurse, the                         anesthesiologist, the anesthetist and the technician                         in the endoscopy suite. Mental Status Examination:                         alert and oriented. Airway Examination: normal                         oropharyngeal airway and neck mobility. Respiratory                         Examination: clear to auscultation. CV Examination:  normal. Prophylactic Antibiotics: The patient does not                         require prophylactic antibiotics. Prior                         Anticoagulants: The patient has taken Eliquis                         (apixaban), last dose  was 3 days prior to procedure.                         ASA Grade Assessment: III - A patient with severe                         systemic disease. After reviewing the risks and                         benefits, the patient was deemed in satisfactory                         condition to undergo the procedure. The anesthesia                         plan was to use monitored anesthesia care (MAC).                         Immediately prior to administration of medications,                         the patient was re-assessed for adequacy to receive                         sedatives. The heart rate, respiratory rate, oxygen                         saturations, blood pressure, adequacy of pulmonary                         ventilation, and response to care were monitored                         throughout the procedure. The physical status of the                         patient was re-assessed after the procedure.                        After obtaining informed consent, the colonoscope was                         passed under direct vision. Throughout the procedure,                         the patient's blood pressure, pulse, and oxygen                         saturations were monitored continuously. The  Colonoscope was introduced through the anus and                         advanced to the the cecum, identified by appendiceal                         orifice and ileocecal valve. The colonoscopy was                         somewhat difficult due to poor bowel prep. The patient                         tolerated the procedure well. The quality of the bowel                         preparation was poor. Findings:      The perianal exam findings include what initially appeared as prolapsed       internal hemorrhoid.      Many small and large-mouthed diverticula were found in the sigmoid colon       and descending colon.      A 16 mm polypoid lesion was found at the anus. The  lesion was polypoid.       No bleeding was present. Mucosa was biopsied with a cold forceps for       histology. One specimen bottle was sent to pathology. Estimated blood       loss was minimal. Impression:            - Preparation of the colon was poor.                        - What initially appeared as prolapsed internal                         hemorrhoid found on perianal exam.                        - Diverticulosis in the sigmoid colon and in the                         descending colon.                        - Polypoid lesion at the anus. Biopsied. Recommendation:        - Discharge patient to home.                        - Resume previous diet.                        - Resume Eliquis (apixaban) at prior dose tomorrow.                        - Await pathology results.                        - Repeat colonoscopy in 6 months because the bowel                         preparation was suboptimal.                        -  Return to referring physician as previously                         scheduled. Procedure Code(s):     --- Professional ---                        [phone], Colonoscopy, flexible; diagnostic, including                         collection of specimen(s) by brushing or washing, when                         performed (separate procedure) Diagnosis Code(s):     --- Professional ---                        D49.0, Neoplasm of unspecified behavior of digestive                         system                        K62.89, Other specified diseases of anus and rectum                        K57.30, Diverticulosis of large intestine without                         perforation or abscess without bleeding CPT copyright 2019 [employer]. All rights reserved. The codes documented in this report are preliminary and upon coder review may  be revised to meet current compliance requirements. Andrey Farmer MD, MD 09/03/2021 9:43:33 AM Number of Addenda: 0 Note Initiated  On: 09/03/2021 9:05 AM Scope Withdrawal Time: 0 hours 9 minutes 59 seconds  Total Procedure Duration: 0 hours 17 minutes 52 seconds  Estimated Blood Loss:  Estimated blood loss was minimal.      [facility]

## 2021-09-03 NOTE — Transfer of Care (Signed)
Immediate Anesthesia Transfer of Care Note  Patient: Jean Johnson  Procedure(s) Performed: COLONOSCOPY WITH PROPOFOL  Patient Location: PACU  Anesthesia Type:General  Level of Consciousness: sedated  Airway & Oxygen Therapy: Patient Spontanous Breathing  Post-op Assessment: Report given to RN and Post -op Vital signs reviewed and stable  Post vital signs: Reviewed and stable  Last Vitals:  Vitals Value Taken Time  BP 165/68 09/03/21 0935  Temp 36 C 09/03/21 0934  Pulse 80 09/03/21 0935  Resp 28 09/03/21 0935  SpO2 100 % 09/03/21 0935    Last Pain:  Vitals:   09/03/21 0934  TempSrc: Temporal  PainSc: Asleep         Complications: No notable events documented.

## 2021-09-03 NOTE — Anesthesia Procedure Notes (Signed)
Date/Time: 09/03/2021 9:07 AM Performed by: Johnna Acosta, CRNA Pre-anesthesia Checklist: Patient identified, Emergency Drugs available, Suction available, Patient being monitored and Timeout performed Patient Re-evaluated:Patient Re-evaluated prior to induction Oxygen Delivery Method: Nasal cannula Preoxygenation: Pre-oxygenation with 100% oxygen Induction Type: IV induction

## 2021-09-06 ENCOUNTER — Encounter: Payer: Self-pay | Admitting: Gastroenterology

## 2021-09-06 LAB — SURGICAL PATHOLOGY

## 2021-09-07 ENCOUNTER — Other Ambulatory Visit: Payer: Self-pay

## 2021-09-07 ENCOUNTER — Ambulatory Visit: Payer: Medicare Other | Admitting: Physical Therapy

## 2021-09-07 ENCOUNTER — Encounter: Payer: Self-pay | Admitting: Physical Therapy

## 2021-09-07 DIAGNOSIS — R42 Dizziness and giddiness: Secondary | ICD-10-CM

## 2021-09-07 DIAGNOSIS — R262 Difficulty in walking, not elsewhere classified: Secondary | ICD-10-CM

## 2021-09-07 DIAGNOSIS — M6281 Muscle weakness (generalized): Secondary | ICD-10-CM

## 2021-09-07 DIAGNOSIS — Z981 Arthrodesis status: Secondary | ICD-10-CM

## 2021-09-07 DIAGNOSIS — R2689 Other abnormalities of gait and mobility: Secondary | ICD-10-CM

## 2021-09-07 NOTE — Therapy (Signed)
[facility] [address]. [place], [place], [address] Phone: [phone]   Fax:  [phone]  Physical Therapy Treatment/Physical Therapy Progress Note   Dates of reporting period  07/29/21   to   09/07/21   Patient Details  Name: Jean Johnson MRN: [id number] Date of Birth: 09/18/1950 Referring Provider (PT): Meade Maw, MD   Encounter Date: 09/07/2021   PT End of Session - 09/07/21 1442     Visit Number 28    Number of Visits 30    Date for PT Re-Evaluation 08/19/21    Authorization Type Medicare and [employer] - VL based on medical necessity, no auth required    Authorization Time Period initial eval 04/15/21, last progress note 07/29/21; Cert 6/78/93-02/18/16    PT Start Time 1436    PT Stop Time 1520    PT Time Calculation (min) 44 min    Equipment Utilized During Treatment Gait belt   rollator for negotiating clinic   Activity Tolerance Patient tolerated treatment well    Behavior During Therapy WFL for tasks assessed/performed             Past Medical History:  Diagnosis Date   Anemia of chronic renal disease    Aortic stenosis, mild    a.) TTE on 11/23/2020 --> mean gradient 9.7 mmHg   Atrial fibrillation (HCC)    a.) CHA2DS2-VASc = 3 (age, sex, diabetes). b.) daily apixaban   B12 deficiency    Cervical spinal stenosis    Chronic anticoagulation    Apixaban   Diabetic neuropathy (HCC)    Diverticulosis    Dyspnea    ESRD (end stage renal disease) (HCC)    First degree AV block    HLD (hyperlipidemia)    Hypertension    Incomplete right bundle branch block (RBBB)    LAE (left atrial enlargement)    a.) TTE on 11/23/2020 --> moderate   Left thyroid nodule    a.) Cervical MRI on 12/29/2020 --> measured "at least" 3.5 cm; imcompletely imaged.   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   Valvular heart disease    a.) TTE 11/23/2020 --> mild panvalvular regurgitation    Past Surgical History:  Procedure Laterality Date   ANTERIOR CERVICAL DECOMP/DISCECTOMY FUSION N/A 03/24/2021   Procedure: C3-6 ANTERIOR CERVICAL DECOMPRESSION/DISCECTOMY FUSION 3 LEVELS;  Surgeon: Meade Maw, MD;  Location: [facility];  Service: Neurosurgery;  Laterality: N/A;   CATARACT EXTRACTION W/PHACO Left 10/21/2020   Procedure: CATARACT EXTRACTION PHACO AND INTRAOCULAR LENS PLACEMENT ([place]) DIABETES LEFT;  Surgeon: Leandrew Koyanagi, MD;  Location: [place];  Service: Ophthalmology;  Laterality: Left;  2.84 0:48.8 5.8%   COLONOSCOPY WITH PROPOFOL N/A 09/03/2021   Procedure: COLONOSCOPY WITH PROPOFOL;  Surgeon: Lesly Rubenstein, MD;  Location: [facility];  Service: Endoscopy;  Laterality: N/A;  DM, ELIQUIS, WHEELCHAIR   EYE SURGERY Right 2020   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   Subjective Assessment - 09/07/21 1437     Subjective Patient reports having notable nausea/vomiting and GI symptoms this past week with her colonoscopy. Pt is undergoing further workup for a couple of lesions per patient report. Patient reports she has ongoing dizziness. She has upcoming pelvic MRI to further investigate gynecological issues and incontinence. Patient reports ongoing 8/10 dizziness at arrival to PT. Pt reports that dizzy spells significantly debilitate her and limit her activity. She reports that she has progressed fairly well with  PT, but she states "I need to push myself more at home." Patient reports 70% SANE score at this time - she feels that she would be notably better if she wasn't dizzy. Patient reports not completing home exercise program like she ought to - she feels debillitated due to fatigue and dizziness.    Pertinent History Patient is a 71 year old female previously seen for dizziness and imbalance, currently s/p C3-6 ACDF (DOS: 03/24/21). No significant complications post-op. She reports some irritation recently along upper periscapular region. Patient is currently donning neck brace until next follow-up with her surgeon. She is refraining from cervical AROM per instructions from physician.  Patient reports using brace the entire day except for completing hygiene tasks, eating, and sleeping. Patient currently has precautions for lifting > one gallon per report; physician's notes state no lifting > 10 pounds in first 6 weeks. Pt is precautioned from bending and stooping at this time. Patient does have remaining neuropathic symptoms on RUE/RLE. Patient reports no notable sciatic-type symptoms at this time. Patient reports no more urinary urgency/frequency than usual; she does occasionally feel hurried to get to the bathroom. Patient reports no fever, chills, night sweats. Pt has worked on gait with home health; she has walked as far 208 feet. She reports significant difficulty with stair negotiation at this time. Patient reports no stairs to enter/exit home. She has to negotiate 14 steps to get to her bedroom - handrail on L side. Pt has tub shower with shower bench; no grab bars. Pt has to negotiate gravel to get into her home. Pt reports having intermittent slight dizziness, but "not that much" recently.    Limitations Walking;House hold activities;Standing    Diagnostic tests Intraoperative fluoroscopy - no post-op imaging yet    Patient Stated Goals Able to walk with improved steadiness                [facility] PT  Assessment - 09/07/21 0001       Berg Balance Test   Sit to Stand Able to stand without using hands and stabilize independently    Standing Unsupported Able to stand safely 2 minutes    Sitting with Back Unsupported but Feet Supported on Floor or Stool Able to sit safely and securely 2 minutes    Stand to Sit Sits safely with minimal use of hands    Transfers Able to transfer safely, minor use of hands    Standing Unsupported with Eyes Closed Able to stand 10 seconds with supervision    Standing Unsupported with Feet Together Needs help to attain position and unable to hold for 15 seconds    From Standing, Reach Forward with Outstretched Arm Can reach forward >5 cm safely (2")    From Standing Position, Pick up Object from Floor Able to pick up shoe safely and easily    From Standing Position, Turn to Look Behind Over each Shoulder Looks behind from both sides and weight shifts well  Turn 360 Degrees Able to turn 360 degrees safely one side only in 4 seconds or less    Standing Unsupported, Alternately Place Feet on Step/Stool Able to complete >2 steps/needs minimal assist    Standing Unsupported, One Foot in Front Loses balance while stepping or standing    Standing on One Leg Unable to try or needs assist to prevent fall    Total Score 37                   FUNCTIONAL OUTCOME MEASURES         Results Comments  BERG 04/19/21: 35/56 05/18/21: 40/56 06/29/21: 38/56 07/29/21: 41/56 09/07/21: 37/56 Fall risk, in need of intervention  TUG 04/15/21: 25 seconds 05/18/21: 22.6 seconds 06/29/21: 20.1 seconds 07/29/21: 18.3 seconds 09/07/21: 22 seconds  Performed using rollator. Above cut-off score for fall risk  5TSTS 04/15/21: 31 seconds 05/18/21: 27 seconds 06/29/21: 23 seconds 07/29/21: 21 seconds 09/07/21: 25 seconds  Fall risk  10 Meter Gait Speed 04/15/21: Self-selected: s =  0.67 m/s;     05/18/21: Self-selected: s = 0.52 m/s 06/29/21: Self-selected: = 0.64 m/s 08/05/21:  10-meter gait speed: Self-selected = 0.58 m/s, fast speed = 0.74 09/07/21: not tested Below normative values for full community ambulation        Gait SPC: intermittent cross-over pattern during swing phase with decreased arm swing and truncal rotation, decreased single-limb support time  and decreased heel strike at initial contact     TREATMENT    Therapeutic Activities  Re-assessment performed (see above and updated Goal section below)  Patient education: discussed plateau in progress to date, recommendation for ending POC this week and continuing follow-up with specialist      ASSESSMENT Patient has ongoing high level of dizziness and gait instability in spite of prolonged time in therapy and screening with vestibular PT and aggressive work on habituation, gaze stability, postural control, and dynamic balance. Patient has made progress relative to her initial evaluation, but she demonstrates diminishing objective measures compared to last two re-evaluations and reports ongoing high-level of dizziness that is unchanged. She has ongoing strength deficits and imbalance in spite of prolonged intervention and HEP including gaze stability, VOR re-training, and safe strategies for LE strengthening. Pt has been intermittently non-compliant with her HEP, and this is largely attributed to significant medical comorbidities and debilitating levels of dizziness on some days. Discussed at length with pt current recommendation to follow-up with specialist/neurology versus continued outpatient therapy. Further outpatient rehab beyond established POC is not recommended at this time.        PT Short Term Goals - 09/07/21 1454       PT SHORT TERM GOAL #1   Title Pt will be independent with HEP in order to improve strength and balance in order to decrease fall risk and improve function at home and work.    Baseline 04/15/21: HEP initiated.   05/19/21: Pt is complaint with HEP given in PT, initiating home  exercises from surgeon this week.   06/29/21: compliant and independent with hom exercise program.  09/07/20: Pt reports intermittent non-compliance    Time 3    Period Days    Status Not Met    Target Date 05/06/21      PT SHORT TERM GOAL #2   Title Patient will demonstrate modified independent gait with SPC for home-mobility distance (150 ft or greater) in clinic with no LOB and safe management of AD    Baseline 04/15/21: Patient using  rollator as primary means of mobility.   05/19/21: Pt still using rollator as primary means of mobility; she has demonstrated gait in parallel bars with no AD with intermittent LOB requiring minA to maintain balance; will practice SPC ambulation next visit.   06/29/21: Patient ambulates with SPC with intermittent ModA required to prevent LOB for home mobility distance.  07/29/21: Patient requires CGA with use of SPC with ModA intermittently to prevent lateral staggering while rounding corner. 09/07/21: MinA to prevent fall during ambulation with cane    Time 4    Period Weeks    Status Partially Met    Target Date 05/13/21               PT Long Term Goals - 09/07/21 1446       PT LONG TERM GOAL #1   Title Pt will improve BERG by at least 3 points in order to demonstrate clinically significant improvement in balance.    Baseline 04/15/21: Baseline BERG to be obtained next visit.   04/19/21: 35/56.  05/18/21: 40/56   06/29/21: 38/56.  07/29/21: 41/56   09/07/21:    Time 8    Period Weeks    Status Achieved    Target Date 05/18/21      PT LONG TERM GOAL #2   Title Pt will decrease 5TSTS by at least 3 seconds in order to demonstrate clinically significant improvement in LE strength.    Baseline 04/15/21: 5TSTS 31 sec.   05/19/21: Improved by 4 seconds.   06/29/21: 5TSTS improved by 8 seconds relative to initial eval (23 sec).  07/29/21: Improved by 10 seconds relative to initial eval (21 sec).   09/07/21: 25 seconds    Time 8    Period Weeks    Status Achieved     Target Date 05/18/21      PT LONG TERM GOAL #3   Title Pt will decrease TUG to below 14 seconds/decrease in order to demonstrate decreased fall risk.    Baseline 04/15/21: TUG 25 sec.   05/18/21: TUG 22.6 sec.   06/29/21:  TUG 20.1 sec   07/29/21: TUG 18.3 sec  09/07/21: 22 sec    Time 8    Period Weeks    Status Partially Met    Target Date 06/10/21      PT LONG TERM GOAL #4   Title Patient will demonstrate improved function as evidenced by a score of 64 on FOTO measure for full participation in activities at home and in the community.    Baseline 04/15/21: FOTO 53.   05/18/21: FOTO 58.   06/29/21: FOTO 60.  07/29/21: FOTO 60.  09/07/21: 53    Time 8    Period Weeks    Status Not Met    Target Date 06/10/21                   Plan - 09/07/21 2104     Clinical Impression Statement Patient has ongoing high level of dizziness and gait instability in spite of prolonged time in therapy and screening with vestibular PT and aggressive work on habituation, gaze stability, postural control, and dynamic balance. Patient has made progress relative to her initial evaluation, but she demonstrates diminishing objective measures compared to last two re-evaluations and reports ongoing high-level of dizziness that is unchanged. She has ongoing strength deficits and imbalance in spite of prolonged intervention and HEP including gaze stability, VOR re-training, and safe strategies for LE strengthening. Pt has been intermittently  non-compliant with her HEP, and this is largely attributed to significant medical comorbidities and debilitating levels of dizziness on some days. Discussed at length with pt current recommendation to follow-up with specialist/neurology versus continued outpatient therapy. Further outpatient rehab beyond established POC is not recommended at this time.    Personal Factors and Comorbidities Age;Comorbidity 3+    Comorbidities chronic kidney disease, Type II DM, peripheral neuropathy,  hypertension, Hx of A-fib, cervical spine stenosis s/p C3-6 fusion    Examination-Activity Limitations Bed Mobility;Stairs;Stand;Locomotion Level;Transfers;Bend;Dressing    Examination-Participation Restrictions Community Activity;Cleaning    Stability/Clinical Decision Making Evolving/Moderate complexity    Rehab Potential Good    PT Frequency 2x / week    PT Duration 6 weeks    PT Treatment/Interventions Gait training;Stair training;Functional mobility training;Therapeutic activities;Therapeutic exercise;Balance training;Neuromuscular re-education;Patient/family education;ADLs/Self Care Home Management;Cryotherapy;Electrical Stimulation;Moist Heat    PT Next Visit Plan Completing PT POC this week with continued HEP and pt to follow-up with neurology for further workup.    PT Home Exercise Plan Access Code: [email or web address]    Consulted and Agree with Plan of Care Patient             Patient will benefit from skilled therapeutic intervention in order to improve the following deficits and impairments:  Abnormal gait, Postural dysfunction, Difficulty walking, Decreased balance, Decreased coordination, Decreased strength, Decreased activity tolerance  Visit Diagnosis: Difficulty in walking, not elsewhere classified  Dizziness and giddiness  Imbalance  Muscle weakness (generalized)  S/P cervical spinal fusion     Problem List Patient Active Problem List   Diagnosis Date Noted   Cervical myelopathy ([place]) 03/24/2021   Anemia 12/03/2020   Essential hypertension 12/03/2020   Hypothyroidism 12/03/2020   Type 2 diabetes mellitus ([place]) 12/03/2020   Atrial fibrillation ([place]) 09/21/2018   Kidney transplant status 09/21/2018   Osteoporosis 09/21/2018   Valentina Gu, PT, DPT #[id number]  Eilleen Kempf, PT 09/07/2021, 9:05 PM  [facility] [address] [place], [place], [address] Phone: [phone]   Fax:  [phone]  Name: Jean Johnson MRN: [id number] Date of Birth: [birth year]

## 2021-09-09 ENCOUNTER — Other Ambulatory Visit: Payer: Self-pay

## 2021-09-09 ENCOUNTER — Ambulatory Visit: Payer: Medicare Other | Attending: Neurosurgery | Admitting: Physical Therapy

## 2021-09-09 DIAGNOSIS — Z981 Arthrodesis status: Secondary | ICD-10-CM | POA: Diagnosis present

## 2021-09-09 DIAGNOSIS — R262 Difficulty in walking, not elsewhere classified: Secondary | ICD-10-CM | POA: Insufficient documentation

## 2021-09-09 DIAGNOSIS — R2689 Other abnormalities of gait and mobility: Secondary | ICD-10-CM | POA: Diagnosis present

## 2021-09-09 DIAGNOSIS — M6281 Muscle weakness (generalized): Secondary | ICD-10-CM | POA: Diagnosis present

## 2021-09-09 DIAGNOSIS — R42 Dizziness and giddiness: Secondary | ICD-10-CM | POA: Insufficient documentation

## 2021-09-09 NOTE — Therapy (Signed)
[facility] [address] [place], [place], [address] Phone: [phone]   Fax:  [phone]  Physical Therapy Treatment  Patient Details  Name: Jean Johnson MRN: [id number] Date of Birth: 07-11-1951 Referring Provider (PT): Meade Maw, MD   Encounter Date: 09/09/2021   PT End of Session - 09/13/21 2112     Visit Number 29    Number of Visits 30    Date for PT Re-Evaluation 08/19/21    Authorization Type Medicare and [employer] - VL based on medical necessity, no auth required    Authorization Time Period initial eval 04/15/21, last progress note 07/29/21; Cert 3/57/01-7/79/39    PT Start Time 1434    PT Stop Time 1516    PT Time Calculation (min) 42 min    Equipment Utilized During Treatment Gait belt   rollator for negotiating clinic   Activity Tolerance Patient tolerated treatment well    Behavior During Therapy WFL for tasks assessed/performed             Past Medical History:  Diagnosis Date   Anemia of chronic renal disease    Aortic stenosis, mild    a.) TTE on 11/23/2020 --> mean gradient 9.7 mmHg   Atrial fibrillation (HCC)    a.) CHA2DS2-VASc = 3 (age, sex, diabetes). b.) daily apixaban   B12 deficiency    Cervical spinal stenosis    Chronic anticoagulation    Apixaban   Diabetic neuropathy (HCC)    Diverticulosis    Dyspnea    ESRD (end stage renal disease) (HCC)    First degree AV block    HLD (hyperlipidemia)    Hypertension    Incomplete right bundle branch block (RBBB)    LAE (left atrial enlargement)    a.) TTE on 11/23/2020 --> moderate   Left thyroid nodule    a.) Cervical MRI on 12/29/2020 --> measured "at least" 3.5 cm; imcompletely imaged.   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   Valvular heart disease    a.) TTE 11/23/2020 --> mild panvalvular regurgitation    Past Surgical History:  Procedure Laterality Date   ANTERIOR CERVICAL DECOMP/DISCECTOMY FUSION N/A 03/24/2021   Procedure: C3-6 ANTERIOR CERVICAL DECOMPRESSION/DISCECTOMY FUSION 3 LEVELS;  Surgeon: Meade Maw, MD;  Location: [facility];  Service: Neurosurgery;  Laterality: N/A;   CATARACT EXTRACTION W/PHACO Left 10/21/2020   Procedure: CATARACT EXTRACTION PHACO AND INTRAOCULAR LENS PLACEMENT ([place]) DIABETES LEFT;  Surgeon: Leandrew Koyanagi, MD;  Location: [place];  Service: Ophthalmology;  Laterality: Left;  2.84 0:48.8 5.8%   COLONOSCOPY WITH PROPOFOL N/A 09/03/2021   Procedure: COLONOSCOPY WITH PROPOFOL;  Surgeon: Lesly Rubenstein, MD;  Location: [facility];  Service: Endoscopy;  Laterality: N/A;  DM, ELIQUIS, WHEELCHAIR   EYE SURGERY Right 2020   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   Subjective Assessment - 09/13/21 2112     Subjective Patient  reports that she sometimes doesn't have remarkable symptoms in the AM. Patient reports dizziness 7-8/10 at arrival today. Patient reports she is doing well with neck pain following ACDF. Patient states she is mostly limited by dizziness at this time. Patient reports her strength is fair, but she does have room for improvement.    Pertinent History Patient is a 71 year old female previously seen for dizziness and imbalance, currently s/p C3-6 ACDF (DOS: 03/24/21). No significant complications post-op. She reports some irritation recently along upper periscapular region. Patient  is currently donning neck brace until next follow-up with her surgeon. She is refraining from cervical AROM per instructions from physician.  Patient reports using brace the entire day except for completing hygiene tasks, eating, and sleeping. Patient currently has precautions for lifting > one gallon per report; physician's notes state no lifting > 10 pounds in first 6 weeks. Pt is precautioned from bending and stooping at this time. Patient does have remaining neuropathic symptoms on RUE/RLE. Patient reports no notable sciatic-type symptoms at this time. Patient reports no more urinary urgency/frequency than usual; she does occasionally feel hurried to get to the bathroom. Patient reports no fever, chills, night sweats. Pt has worked on gait with home health; she has walked as far 208 feet. She reports significant difficulty with stair negotiation at this time. Patient reports no stairs to enter/exit home. She has to negotiate 14 steps to get to her bedroom - handrail on L side. Pt has tub shower with shower bench; no grab bars. Pt has to negotiate gravel to get into her home. Pt reports having intermittent slight dizziness, but "not that much" recently.    Limitations Walking;House hold activities;Standing    Diagnostic tests Intraoperative fluoroscopy - no post-op imaging yet    Patient Stated Goals Able to walk with improved steadiness               10 Meter Gait Speed 04/15/21: Self-selected: s =  0.67 m/s;      05/18/21: Self-selected: s = 0.52 m/s  06/29/21: Self-selected: = 0.64 m/s  08/05/21: 10-meter gait speed: Self-selected = 0.58 m/s, fast speed = 0.74 m/s  09/07/21: fast speed = 0.32 m/s (significant staggering and loss of forward progression today) Below normative values for full community ambulation      TREATMENT     Neuromuscular Re-education - for static and dynamic postural stability as needed to improve balance with performance of ADLs and household/community mobility  tasks, equilibrium and non-equilibrium coordination to improve motor control with standing activities     Standing adjacent to locked rollator with chair behind pt: -Standing VOR x 1 (slow cadence); 3x15; horiz and vert, feet together on floor -Standing narrow BOS, eyes closed; 1x10 sec, 1x15 sec, 1x20sec ( 3 cm apart) -VOR cancellation with visual target en bloc; using thumb for target; 2x30 seconds in standing        *not today* -Standing feet apart, eyes closed; 1x20sec, 1x60sec -Gait in hallway with visual scanning and identifying targets ; x 2 D/B (identifying letters and numbers on wall) -Gait with head turns in hallway, with rollator; x2 D/B; multi-directional cueing for up/down/left/right -Standing saccades, feet together, horizontal and vertical; 1x23 vertical, 3x12 horizontal -Standing toe tap; 2x10 alternating, 6-inch step -Hurdle step focusing on heel strike at initial contact, holding bilateral parallel bars; x10 on each LE -Forward stepping along parallel bars with yellow band around lower limbs to resist adduction; 3x D/B -Standing on Airex, feet apart, eyes closed; -*Unable to maintain  feet together/standing Romberg today Standing Romberg; 3x30sec Standing on Airex with feet together; 2x30sec       Therapeutic Exercise - improved strength as needed to improve performance of CKC activities/functional movements and as needed for improved power production to prevent fall during episode of large postural perturbation   Ambulate laps in hallway; x 2 laps length of hall (70 feet); with SPC RUE, with verbal cueing for heel strike and decreased excessive toe-out  Sit to stand with 4-lb Goblet hold, from chair in gym; 2x8   Pt edu: HEP update and review     *not today* In // bars: Standing heel raise/toe raise, alternating; 2x10  Seated hip abduction, black Tband; 2x10  Seated march; 2x10 [heaviness with lifting LLE] LAQ; 2x10; 5-lb cuff weight, bilateral LE        *not today* MHP (unbilled) utilized pre-treatment for analgesic effect and improved soft tissue extensibility and to improve participation in active PT intervention, along low back in sitting; x 5 minutes          ASSESSMENT Patient has reached plateau in progress in spite of significant time spent working on vestibular re-training, gait training, strengthening, and HEP. She did have notable weakness attributed to cervical myelopathy and post-operative impairments. Pt did make early progress with her outcome measures noted in her goals, but further progress has been limited over the last 2 months with no progress demonstrated recently with re-assessment. At this time, patient is significantly limited by dizziness and dysequilibrium - clinical presentation inconsistent with peripheral vestibular disorder and cerebrovascular etiology has been ruled out with brain MRI. Utilized this session to establish maintenance plan and next steps for following up with specialist given ongoing recalcitrant symptoms (dizziness, visual disturbance, gait ataxia). Further plan of care not recommended given plateau in progress in spite of prolonged plan of care and interventions noted above.        PT Short Term Goals - 09/07/21 1454       PT SHORT TERM GOAL #1   Title Pt will be independent with HEP in order to improve strength and balance in order to decrease fall risk and improve function at home and work.    Baseline 04/15/21: HEP initiated.   05/19/21: Pt is complaint with HEP given in PT, initiating home exercises from surgeon this week.   06/29/21: compliant and independent with hom exercise program.  09/07/20: Pt reports intermittent non-compliance    Time 3    Period Days    Status Not Met    Target Date 05/06/21      PT SHORT TERM GOAL #2   Title Patient will demonstrate modified independent gait with SPC for home-mobility distance (150 ft or greater) in clinic with no LOB and safe management of AD     Baseline 04/15/21: Patient using rollator as primary means of mobility.   05/19/21: Pt still using rollator as primary means of mobility; she has demonstrated gait in parallel bars with no AD with intermittent LOB requiring minA to maintain balance; will practice SPC ambulation next visit.   06/29/21: Patient ambulates with SPC with intermittent ModA required to prevent LOB for home mobility distance.  07/29/21: Patient requires CGA with use of SPC with ModA intermittently to prevent lateral staggering while rounding corner. 09/07/21: MinA to prevent fall during ambulation with cane    Time 4    Period Weeks    Status Partially Met    Target Date 05/13/21  PT Long Term Goals - 09/07/21 1446       PT LONG TERM GOAL #1   Title Pt will improve BERG by at least 3 points in order to demonstrate clinically significant improvement in balance.    Baseline 04/15/21: Baseline BERG to be obtained next visit.   04/19/21: 35/56.  05/18/21: 40/56   06/29/21: 38/56.  07/29/21: 41/56   09/07/21:    Time 8    Period Weeks    Status Achieved    Target Date 05/18/21      PT LONG TERM GOAL #2   Title Pt will decrease 5TSTS by at least 3 seconds in order to demonstrate clinically significant improvement in LE strength.    Baseline 04/15/21: 5TSTS 31 sec.   05/19/21: Improved by 4 seconds.   06/29/21: 5TSTS improved by 8 seconds relative to initial eval (23 sec).  07/29/21: Improved by 10 seconds relative to initial eval (21 sec).   09/07/21: 25 seconds    Time 8    Period Weeks    Status Achieved    Target Date 05/18/21      PT LONG TERM GOAL #3   Title Pt will decrease TUG to below 14 seconds/decrease in order to demonstrate decreased fall risk.    Baseline 04/15/21: TUG 25 sec.   05/18/21: TUG 22.6 sec.   06/29/21:  TUG 20.1 sec   07/29/21: TUG 18.3 sec  09/07/21: 22 sec    Time 8    Period Weeks    Status Partially Met    Target Date 06/10/21      PT LONG TERM GOAL #4   Title Patient will  demonstrate improved function as evidenced by a score of 64 on FOTO measure for full participation in activities at home and in the community.    Baseline 04/15/21: FOTO 53.   05/18/21: FOTO 58.   06/29/21: FOTO 60.  07/29/21: FOTO 60.  09/07/21: 53    Time 8    Period Weeks    Status Not Met    Target Date 06/10/21                   Plan - 09/13/21 2129     Clinical Impression Statement Patient has reached plateau in progress in spite of significant time spent working on vestibular re-training, gait training, strengthening, and HEP. She did have notable weakness attributed to cervical myelopathy and post-operative impairments. Pt did make early progress with her outcome measures noted in her goals, but further progress has been limited over the last 2 months with no progress demonstrated recently with re-assessment. At this time, patient is significantly limited by dizziness and dysequilibrium - clinical presentation inconsistent with peripheral vestibular disorder and cerebrovascular etiology has been ruled out with brain MRI. Utilized this session to establish maintenance plan and next steps for following up with specialist given ongoing recalcitrant symptoms (dizziness, visual disturbance, gait ataxia). Further plan of care not recommended given plateau in progress in spite of prolonged plan of care and interventions noted above.    Personal Factors and Comorbidities Age;Comorbidity 3+    Comorbidities chronic kidney disease, Type II DM, peripheral neuropathy, hypertension, Hx of A-fib, cervical spine stenosis s/p C3-6 fusion    Examination-Activity Limitations Bed Mobility;Stairs;Stand;Locomotion Level;Transfers;Bend;Dressing    Examination-Participation Restrictions Community Activity;Cleaning    Stability/Clinical Decision Making Evolving/Moderate complexity    Rehab Potential Good    PT Frequency 2x / week    PT Duration 6 weeks  PT Treatment/Interventions Gait training;Stair  training;Functional mobility training;Therapeutic activities;Therapeutic exercise;Balance training;Neuromuscular re-education;Patient/family education;ADLs/Self Care Home Management;Cryotherapy;Electrical Stimulation;Moist Heat    PT Next Visit Plan Completing PT POC this week with continued HEP and pt to follow-up with neurology for further workup. Discontinuing PT at this time due to plateau in progress.    PT Home Exercise Plan Access Code: [email or web address]    Consulted and Agree with Plan of Care Patient             Patient will benefit from skilled therapeutic intervention in order to improve the following deficits and impairments:  Abnormal gait, Postural dysfunction, Difficulty walking, Decreased balance, Decreased coordination, Decreased strength, Decreased activity tolerance  Visit Diagnosis: Difficulty in walking, not elsewhere classified  Dizziness and giddiness  Imbalance  Muscle weakness (generalized)  S/P cervical spinal fusion     Problem List Patient Active Problem List   Diagnosis Date Noted   Cervical myelopathy ([place]) 03/24/2021   Anemia 12/03/2020   Essential hypertension 12/03/2020   Hypothyroidism 12/03/2020   Type 2 diabetes mellitus ([place]) 12/03/2020   Atrial fibrillation ([place]) 09/21/2018   Kidney transplant status 09/21/2018   Osteoporosis 09/21/2018   Valentina Gu, PT, DPT #[id number]  Eilleen Kempf, PT 09/13/2021, 9:29 PM  [facility] [address] [place], [place], [address] Phone: [phone]   Fax:  [phone]  Name: Jean Johnson MRN: [id number] Date of Birth: 07/27/1950

## 2021-09-13 ENCOUNTER — Encounter: Payer: Self-pay | Admitting: Physical Therapy

## 2021-09-15 ENCOUNTER — Other Ambulatory Visit: Payer: Self-pay | Admitting: Gastroenterology

## 2021-09-15 ENCOUNTER — Ambulatory Visit
Admission: RE | Admit: 2021-09-15 | Discharge: 2021-09-15 | Disposition: A | Discharge status: Home / Self Care | Payer: Medicare Other | Source: Ambulatory Visit | Attending: Gastroenterology | Admitting: Gastroenterology

## 2021-09-15 ENCOUNTER — Other Ambulatory Visit: Payer: Self-pay

## 2021-09-15 DIAGNOSIS — K603 Anal fistula: Secondary | ICD-10-CM

## 2021-09-15 DIAGNOSIS — K6289 Other specified diseases of anus and rectum: Secondary | ICD-10-CM | POA: Insufficient documentation

## 2021-09-15 MED ORDER — GADOBUTROL 1 MMOL/ML IV SOLN
5.0000 mL | Freq: Once | INTRAVENOUS | Status: AC | PRN
  Administered 2021-09-15: 5 mL via INTRAVENOUS

## 2021-11-10 ENCOUNTER — Other Ambulatory Visit: Payer: Self-pay | Admitting: Internal Medicine

## 2021-11-10 DIAGNOSIS — Z1231 Encounter for screening mammogram for malignant neoplasm of breast: Secondary | ICD-10-CM

## 2021-12-14 ENCOUNTER — Ambulatory Visit
Admission: RE | Admit: 2021-12-14 | Discharge: 2021-12-14 | Disposition: A | Discharge status: Home / Self Care | Payer: Medicare Other | Source: Ambulatory Visit | Attending: Internal Medicine | Admitting: Internal Medicine

## 2021-12-14 DIAGNOSIS — Z1231 Encounter for screening mammogram for malignant neoplasm of breast: Secondary | ICD-10-CM | POA: Insufficient documentation

## 2021-12-28 ENCOUNTER — Inpatient Hospital Stay
Admission: RE | Admit: 2021-12-28 | Discharge: 2021-12-28 | Disposition: A | Discharge status: Home / Self Care | Payer: Self-pay | Source: Ambulatory Visit | Attending: *Deleted | Admitting: *Deleted

## 2021-12-28 ENCOUNTER — Other Ambulatory Visit: Payer: Self-pay | Admitting: *Deleted

## 2021-12-28 DIAGNOSIS — Z1231 Encounter for screening mammogram for malignant neoplasm of breast: Secondary | ICD-10-CM

## 2021-12-31 ENCOUNTER — Inpatient Hospital Stay
Admission: RE | Admit: 2021-12-31 | Discharge: 2021-12-31 | Disposition: A | Discharge status: Home / Self Care | Payer: Self-pay | Source: Ambulatory Visit | Attending: *Deleted | Admitting: *Deleted

## 2021-12-31 ENCOUNTER — Other Ambulatory Visit: Payer: Self-pay | Admitting: *Deleted

## 2021-12-31 DIAGNOSIS — Z1231 Encounter for screening mammogram for malignant neoplasm of breast: Secondary | ICD-10-CM

## 2022-01-12 ENCOUNTER — Other Ambulatory Visit: Payer: Self-pay | Admitting: Infectious Diseases

## 2022-01-12 DIAGNOSIS — R928 Other abnormal and inconclusive findings on diagnostic imaging of breast: Secondary | ICD-10-CM

## 2022-01-12 DIAGNOSIS — N63 Unspecified lump in unspecified breast: Secondary | ICD-10-CM

## 2022-01-26 ENCOUNTER — Ambulatory Visit: Payer: Medicare Other | Attending: Internal Medicine

## 2022-01-26 DIAGNOSIS — M6281 Muscle weakness (generalized): Secondary | ICD-10-CM | POA: Insufficient documentation

## 2022-01-26 DIAGNOSIS — R278 Other lack of coordination: Secondary | ICD-10-CM | POA: Insufficient documentation

## 2022-01-26 DIAGNOSIS — R2681 Unsteadiness on feet: Secondary | ICD-10-CM | POA: Diagnosis not present

## 2022-01-26 DIAGNOSIS — R42 Dizziness and giddiness: Secondary | ICD-10-CM | POA: Insufficient documentation

## 2022-01-26 NOTE — Therapy (Signed)
OUTPATIENT PHYSICAL THERAPY WHEELCHAIR EVALUATION   Patient Name: Jean Johnson MRN: [id number] DOB:1951-01-22, 71 y.o., female Today's Date: 01/27/2022   PT End of Session - 01/27/22 0924     Visit Number 1    Number of Visits 1    Date for PT Re-Evaluation 01/26/22    PT Start Time 1442    PT Stop Time 1531    PT Time Calculation (min) 49 min    Equipment Utilized During Treatment Gait belt    Activity Tolerance Patient tolerated treatment well    Behavior During Therapy WFL for tasks assessed/performed             Past Medical History:  Diagnosis Date   Anemia of chronic renal disease    Aortic stenosis, mild    a.) TTE on 11/23/2020 --> mean gradient 9.7 mmHg   Atrial fibrillation (HCC)    a.) CHA2DS2-VASc = 3 (age, sex, diabetes). b.) daily apixaban   B12 deficiency    Cervical spinal stenosis    Chronic anticoagulation    Apixaban   Diabetic neuropathy (HCC)    Diverticulosis    Dyspnea    ESRD (end stage renal disease) (HCC)    First degree AV block    HLD (hyperlipidemia)    Hypertension    Incomplete right bundle branch block (RBBB)    LAE (left atrial enlargement)    a.) TTE on 11/23/2020 --> moderate   Left thyroid nodule    a.) Cervical MRI on 12/29/2020 --> measured "at least" 3.5 cm; imcompletely imaged.   Long-term use of immunosuppressant medication    a.) takes daily mycophenolate, tacrolimus, prednisone   Murmur    Nephrolithiasis    Osteoporosis    PONV (postoperative nausea and vomiting)    Post-transplant diabetes mellitus (HCC)    Renal transplant recipient    a.) living donor transplant from sister on 12/26/1998; rejected organ in 2006 and restarted on hemodialysis. 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   Valvular heart disease    a.) TTE 11/23/2020 --> mild panvalvular regurgitation   Past Surgical History:  Procedure Laterality Date   ANTERIOR CERVICAL DECOMP/DISCECTOMY FUSION  N/A 03/24/2021   Procedure: C3-6 ANTERIOR CERVICAL DECOMPRESSION/DISCECTOMY FUSION 3 LEVELS;  Surgeon: Venetia Night, MD;  Location: [facility];  Service: Neurosurgery;  Laterality: N/A;   CATARACT EXTRACTION W/PHACO Left 10/21/2020   Procedure: CATARACT EXTRACTION PHACO AND INTRAOCULAR LENS PLACEMENT (IOC) DIABETES LEFT;  Surgeon: Lockie Mola, MD;  Location: [facility];  Service: Ophthalmology;  Laterality: Left;  2.84 0:48.8 5.8%   COLONOSCOPY WITH PROPOFOL N/A 09/03/2021   Procedure: COLONOSCOPY WITH PROPOFOL;  Surgeon: Regis Bill, MD;  Location: [facility];  Service: Endoscopy;  Laterality: N/A;  DM, ELIQUIS, WHEELCHAIR   EYE SURGERY Right 2020   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   Living donor organ recipient (sister)   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   Cadaveric organ recipient   Patient Active Problem List   Diagnosis Date Noted   Cervical myelopathy (HCC) 03/24/2021   Anemia 12/03/2020   Essential hypertension 12/03/2020   Hypothyroidism 12/03/2020   Type 2 diabetes mellitus (HCC) 12/03/2020   Atrial fibrillation (HCC) 09/21/2018   Kidney transplant status 09/21/2018   Osteoporosis 09/21/2018    PCP: Enid Baas, MD  REFERRING PROVIDER: Enid Baas, MD   REFERRING DIAG: Polyneuropathy, Ataxia, cervical myelopathy  THERAPY DIAG:  Unsteadiness on feet  Dizziness and giddiness  Other lack of coordination  Muscle weakness (generalized)  ONSET DATE: Reports difficulty with balance since surgical infusion in her neck, C3-6 ACDF (DOS: 03/24/21), pt with hx of multiple comorbidities likely impacting balance and mobility  Rationale for Evaluation and Treatment Rehabilitation  SUBJECTIVE:                                                                                                                                                                                           SUBJECTIVE STATEMENT: Pt presents today in transport  chair for power wheelchair evaluation. She is accompanied by her husband who propels her chair for her. Reports onset of balance impairment and dizziness following surgical c-spine fusion in Sept. 2022, however, this is in the context of multiple comorbidites. Has since experienced decline in her mobility. Pt has a walker at home which she uses in her home for very limited distances. She reports she is unable to ambulate for even 10 minutes, and her gait is impacted by chronic dizziness and poor balance. Pt seen in PT for dizziness, but did not improve, has since been discharged. Pt seen by opthalmology and now waiting for MRA to try to identify cause of chronic dizziness. Pt reports 2 falls within the past month. One fall occurred when using her walker, and the other fall was from her bed when trying to get up and reach her walker.  Pt reports urinary urgency, that she has not always made it to the bathroom on time, she wears depends. Pt normally spends day in recliner, reports is unable to sit in transport chair for hours at a time because it is uncomfortable. Unable to leave house unless she has assistance, such as her husband transporting her in transport chair.  Pt needs assistance getting up and down her stairs since it is unsafe for her to attempt on her own (bedroom upstairs per chart). Pt reports no hx of skin breakdown or wounds. Pt exhibits swelling in LLE in foot/ankle. Reports due to DM. She has intermittent L hip pain, thinks this is related to osteoporosis. Hx of kidney transplant (2000, 2010). Pt has LUE fistula.   PERTINENT HISTORY:  The pt is a pleasant 71 yo R-handed female with multiple medical comorbidities affecting her mobility including significant peripheral neuropathy, ataxia and cervical myelopathy now s/p C-spine fusion 03/24/21, and chronic dizziness. Pt currently uses transport chair for mobility (husband pushes chair for her). She uses walker for limited home distances, however, is  unsafe with recent hx of 2 falls in last month. Unable to ambulate for long distances or travel outside of her home unless she is assisted in transport chair. Other PMH is  significant for kidney transplant (in 2000 and 2010), anemia and chronic renal failure, a-fib, B12 deficiency, DM with neuropathy, ESRD, HLD, HTN, osteoporosis, pyelonephritis, spinal stenosis of cervical region.  PAIN:  Are you having pain? No pain currently.   PRECAUTIONS: Fall  WEIGHT BEARING RESTRICTIONS No  FALLS:  Has patient fallen in last 6 months? Yes. Number of falls 2  LIVING ENVIRONMENT: Lives with: lives with their family and lives with their spouse Lives in: Other townhouse, no steps to enter Stairs: Yes: Internal: 14 steps; on left going up Has following equipment at home: Shower bench, walker, transport chair,  Comments: sits on bench, holds on towel and wall to get out  OCCUPATION: retired  PLOF: Independent Could ambulate down her street without an Ava  Pt would like a power chair to improve her mobility   PATIENT INFORMATION: This Evaluation form will serve as the LMN for the following suppliers: [employer]  Supplier: [employer] Contact Person: Mammie Lorenzo Phone: [phone]   Reason for Referral: power chair, pt with polyneuropathy, Ataxia, cervical myelopathy Patient/caregiver Goals: improve mobility with power chair Patient was seen for face-to-face evaluation for new power wheelchair.  Also present was  Deberah [employer] to discuss recommendations and wheelchair options.  Further paperwork was completed and sent to vendor.  Patient appears to qualify for power mobility device at this time per objective findings.   MEDICAL HISTORY: see subjective above for history Diagnosis: Polyneuropathy, Ataxia, cervical myelopathy, pt also with hx of kidney transplant Primary Diagnosis Onset: reports primary decreases in mobility and difficulty with dizziness started following surgical  c-spine fusion 03/24/21, however, pt also with multiple significant comorbidities.  [x] Progressive Disease Relevant Past and Future Surgeries: Height: 5'1" per chart Weight: 115 lb per chart Explain and recent changes or trends in weight: does report unexplained weight loss in the last year, has improved appetite since. Reports her physician is aware and plans to further discuss this with physician.   Relevant History including falls: 2 falls in last 6 mo, one fall from her bed when trying to reach her walker.    HOME ENVIRONMENT: [] House  [x] Condo/town home  [] Apartment  [] Assisted Living    [] Lives Alone [x]  Lives with Others          Lives with spouse                                          Hours with caregiver:   [x] Home is accessible to patient            Stairs  [] Yes []  No     Ramp [] Yes [x] No Comments:  level entry to first floor, however, requires assistance to get to second level   COMMUNITY ADL: TRANSPORTATION: [x] Car  SUV  [] Van    [] Public Transportation    [] Adapted w/c Lift   [] Ambulance   [] Other:       [] Sits in wheelchair during transport  Employment/School:    retired Specific requirements pertaining to mobility                                                     Other:  FUNCTIONAL/SENSORY PROCESSING SKILLS:  Handedness:   _0 Right   *neuropathy in R hand wears glove to assist, able to use R hand   _1 Left    _2 NA  Comments:                                 Functional Processing Skills for Wheeled Mobility _3 Processing Skills are adequate for safe wheelchair operation, however, insufficient endurance/strength, balance to operate a manual chair  Areas of concern than may interfere with safe operation of wheelchair Description of problem   _4  Attention to environment     _5 Judgment     _6  Hearing  _7  Vision or visual processing   _8 Motor Planning  _9  Fluctuations in Behavior   wears glasses   VERBAL  COMMUNICATION: _10 WFL receptive _11  WFL expressive _12 Understandable  _13 Difficult to understand  _14 non-communicative _15  Uses an augmented communication device    CURRENT SEATING / MOBILITY: Current Mobility Base:   _16 None  _17 Dependent  _18 Manual 4-5 years, using father's transport chair  _19 Scooter  _20 Power   Type of Control:                       Manufacturer:                         Size:                         Age:  4-5                         Current Condition of Mobility Base:        no cushion                                                                                                             Current Wheelchair components:                                                                                                                                   Describe posture in present seating: system:  slumped, sacral sitting, rounded and protracted shoulders, forward head, adducted BLE due to narrow seat width  SENSATION and SKIN ISSUES: Sensation _0 Intact _1 Impaired _2 Absent   Level of sensation:   numbness reported RUE from elbow to hand and numbness/tingling reported throughout RLE with testing                        Pressure Relief: Able to perform effective pressure relief :   _3 Yes  _4  No Method:                                                                              If not, Why?:                                                               Skin Issues/Skin Integrity Current Skin Issues   _5 Yes _6 No  _7 Intact _8  Red area _9  Open Area  _10 Scar Tissue _11 At risk from prolonged sitting  Where                              History of Skin Issues   _12 Yes _13 No  Where                                         When                                               Hx of skin flap surgeries _14 Yes _15 No  Where                  When                                                   Limited sitting tolerance _16 Yes _17 No Hours spent sitting in wheelchair daily:        cannot sustain hours in current chair, reports uncomfortable. Spends most of day in recliner                                                 Complaint of Pain:  Please describe:   L hip  Swelling/Edema:     Present currently in L foot/ankle. Pt reports at times R LE swells, not observed in RLE currently                                                                                                                                              ADL STATUS (in reference to wheelchair use):  Indep Assist Unable Indep with Equip Not assessed Comments  Dressing    Mod I                                                           Must perform seated          Eating  x                                                                                                                         Toileting          Mod I                                                          Requires use of UEs for support/balance                                                           Bathing         Mod I/limited due to dizziness                                                           Must sit, requires UE support  Grooming/ Hygiene   Mod I                                                                                                                           Meal Prep    Mod I                                                                                                                      IADLS                                                                                                                  Bowel Management: [x] Continent  [] Incontinent  [] Accidents Comments:                                                  Bladder Management: [] Continent  [x] Incontinent   [x] Accidents Comments:     wears depends                                         WHEELCHAIR SKILLS: Manual w/c Propulsion: [] UE or LE strength and endurance sufficient to participate in ADLs using manual wheelchair  Not sufficient Arm :  [] left [] right  [] Both                                   Foot:   [] left [] right  [] Both  Distance:   Operate Scooter: []  Strength, hand grip, balance and transfer appropriate for use [] Living environment is accessible for use of scooter  Insufficient trunk strength/balance and stability per MMT and seated balance testing  Operate Power w/c:  [x]  Std. Joystick   []  Alternative Controls Indep []  Assist []   Dependent/ Unable []  N/A []  [] Safe          []  Functional      Distance:                Bed confined without wheelchair [x]  Yes Does use walker in home, however, very unsafe due to dizziness, poor balance, ataxic gait, recent hx of 2 falls within last month per pt []  No   STRENGTH/RANGE OF MOTION:  Range of Motion Strength  Shoulder                     WNL                                          Grossly 4/5                                              Elbow                  WNL                                               Grossly 4/5                                               Wrist/Hand                R currently in brace, L WNL                                                                      Grossly 4-/5                                                   Hip                          Limited hip ext. With gait                                                                 Grossly 4/5                                  Knee                 Exhibits knee flexion B with  short ambulation (see TUG) with RW                                                                    Grossly 4/5                                Ankle Slight limitation likely due to swelling in L ankle                               Grossly 4-/5                                         MOBILITY/BALANCE:  []  Patient is totally dependent for mobility                                                                                               Balance Transfers Ambulation  Sitting Balance: Standing Balance: []  Independent []  Independent/Modified Independent  []  WFL     []  WFL []  Supervision []  Supervision  [x]  Uses UE for balance  []  Supervision [x]  Min Assist - unsafe due to dizziness, ataxic movement, requires UE support to increase safety []  Ambulates with Assist                           []  Min Assist [x]  Min assist - unsafe due to dizziness []  Mod Assist [x]  Ambulates with Device:  [x]  RW ataxic, unsafe, high fall risk (see TUG)  []  StW   []  Cane   []                 []  Mod Assist []  Mod assist []  Max assist   []  Max Assist []  Max assist []  Dependent []  Indep. Short Distance Only  []  Unable []  Unable []  Lift / Sling Required Distance (in feet)                             []  Sliding board []  Unable to Ambulate: (Explain:  Cardio Status:  [] Intact  []  Impaired   [x]  NA                              Respiratory Status:  [] Intact   [] Impaired   [x] NA                                     Orthotics/Prosthetics:  wears RUE wrist brace due to neuropathy                                             Comments (Address manual vs power w/c vs scooter):         The pt has insufficient UE/LE strength and endurance to operate a manual wheelchair. The pt has insufficient trunk strength and balance for safe operation of a scooter, additionally chronic, unresolved dizziness increases this risk (pt dizzy seated at baseline). For these reasons, the pt requires a power chair to maximize safety and decrease fall risk.                                       Other tests: TUG: 36 seconds with RW (close CGA-min a) due to ataxia, extremely unsafe ambulator, high fall risk     Anterior / Posterior Obliquity Rotation-Pelvis  PELVIS    '[]'$ Neutral  $Remove'[x]'OJjByEZ$  Posterior  $RemoveBe'[]'csQqnqikf$   Anterior     '[]'$ WFL  $Re'[x]'StY$ Right Elevated  $RemoveB'[]'TPwlFZXM$ Left Elevated   '[x]'$ WFL  $Re'[]'NQc$ Right Anterior $RemoveBefore'[]'esVccGItBSnvX$   Left Anterior    '[]'$  Fixed $Remo'[x]'WicLq$  Partly Flexible $RemoveBeforeD'[]'FoPCxphcNgChlS$  Flexible  $RemoveB'[]'HqQIBCvv$  Other  $Remo'[]'fENmg$  Fixed  $Remo'[x]'HjXNt$  Partly Flexible  $RemoveB'[]'tMDuMwvT$  Flexible $RemoveB'[]'UmnlygQz$  Other  $Remo'[]'QXBDa$  Fixed  $Remo'[x]'gtzkB$  Partly Flexible  $RemoveB'[]'PNnAjPcd$  Flexible $RemoveB'[]'idZsjvZA$  Other  TRUNK $Remo'[]'MvKzP$ WF'[x]'$ Thoracic Kyphosis $RemoveBeforeDEI'[]'wYGJqvdtrYUjGoFW$ Lumbar Lordosis   '[]'$  WFL $Re'[]'OWP$ Convex Right $RemoveBefo'[]'tknxjBrdRIo$ Convex Left   '[]'$ c-curve $R'[]'iN$ s-curv'[]'$ multiple  $RemoveB'[x]'IhrPzbuJ$  Neutral $Remove'[]'ddkKrMD$  Left-anterior $RemoveBefore'[]'ohjQxmcCpWVKM$  Right-anterior    '[]'$  Fixed $Remo'[]'yUNik$  Flexible $RemoveB'[x]'uCbLPdhD$  Partly Flexible       Other  $Remo'[]'ZJtYf$  Fixed $Remo'[]'CbWgC$  Flexible $RemoveB'[x]'GecHcGXk$  Partly Flexible $RemoveBeforeD'[]'YmEssOEIASPWNO$  Other  $Remo'[]'FbJCQ$  Fixed           '[]'$  Flexible $RemoveB'[x]'Jkxnzqcz$  Partly Flexible $RemoveBeforeD'[x]'aYPCKxnPVUxfQA$  Other   Position Windswept   HIPS  $Rem'[]'MoDU$  Neutral $Remove'[]'wxKnYgO$  Abduct $Remov'[x]'GAQROE$  Adduct - likely due to current seating (chair too narrow) $RemoveBef'[]'zfLNEerXUw$  Neutral $Remove'[]'LdDqsYI$  Right $Remo'[]'iHHEM$  Left       '[]'$  Fixed  $Remo'[x]'JUmGg$  Partly Flexible             '[]'$  Dislocated $RemoveBef'[]'DkMVQjiDbf$  Flexible $RemoveB'[]'cGDpLVLS$  Subluxed    '[]'$  Fixed $Remo'[x]'JhoyE$  Partly Flexible  $RemoveB'[]'BpDYNmvJ$  Flexible $RemoveB'[]'dZwubpwh$  Other              Foot Positioning Knee Positioning   Knees and  Feet  $Rem'[x]'Twet$  WFL $Re'[]'urv$ Lef'[]'$ Right $Remo'[]'VTdib$  WFL $Re'[]'EOv$ Lef'[]'$ Right   KNEES ROM concerns: ROM concerns: slightly flexed in standing and with gait, adduction in current seating in transport chair    & Dorsi-Flexed                    '[]'$ Lt $R'[]'Vz$ Rt                                  FEET Plantar Flexed                  '[]'$ Lt $R'[]'JU$ Rt     Inversion                    '[]'$ Lt $R'[]'PR$ Rt     Eversion                    '[]'$ Lt $R'[]'tZ$ Rt    HEAD $Rem'[]'Hvsl$  Functional $RemoveBef'[x]'PqAORgRDXd$  Good Head  Control   & [x]  Flexed         []  Extended []  Adequate Head Control   NECK []  Rotated  Lt  []  Lat Flexed Lt []  Rotated  Rt []  Lat Flexed Rt []  Limited Head Control    []  Cervical Hyperextension []  Absent  Head Control    SHOULDERS ELBOWS WRIST& HAND         Left     Right    Left     Right  U/E [] Functional  Left            [] Functional  Right                                 [] Fisting             [] Fisting     [x] elevated Left [] depressed  Left [x] elevated Right [] depressed  Right       [x] protracted Left [] retracted Left [x] protracted Right [] retracted Right [] subluxed  Left              [] subluxed  Right         Goals for Wheelchair Mobility  [x]  Independence with mobility in the home with motor related ADLs (MRADLs)  [x]  Independence with MRADLs in the community []  Provide dependent mobility  []  Provide recline     [] Provide tilt   Goals for Seating system []  Optimize pressure distribution [x]  Provide support needed to facilitate function or safety []  Provide corrective forces to assist with maintaining or improving posture []  Accommodate client's posture: current seated postures and positions are not flexible or will not tolerate corrective forces [x]  Client to be independent with relieving pressure in the wheelchair [] Enhance physiological function such as breathing, swallowing, digestion  Simulation ideas/Equipment trials:                                                                                                State why other equipment was unsuccessful:                 The pt is a pleasant 71 yo female evaluated for a power wheelchair. Pt with complex medical hx, multiple comorbidites likely contributing to impaired balance/gait, with further decline in mobility following c-spine fusion in 2022. Pt with unresolved, chronic dizziness and a hx of 2 recent falls: one attempting to use RW in home, the other a fall from her bed attempting to reach RW. The pt is at a high risk for future falls as evidenced by TUG performance of 36 seconds, MMT, neuropathy in RUE, and insufficient UE/LE and trunk strength/stability for safe operation of a manual chair or scooter. Pt also with hx of urinary incontinence/accidents when attempting to make it to the bathroom while using RW, placing her at an even greater risk of falls or development of possible skin breakdown in the future. Due to these impairments, the pt will require a Go Chair power chair to maximize safe mobility  with ADLs and to decrease fall risk. Please refer to mobility base  recommendations and justifications below for selected features that are necessary to maximize safety and to decrease fall risk.                                                             MOBILITY BASE RECOMMENDATIONS and JUSTIFICATION: MOBILITY COMPONENT JUSTIFICATION  Manufacturer:     [employer]      Model:     Go Chair         Size: Width   18        Seat Depth    17         [x] provide transport from point A to B [x] promote Indep mobility  [x] is not a safe, functional ambulator [x] walker or cane inadequate [] non-standard width/depth necessary to accommodate anatomical measurement []                             [] Manual Mobility Base [] non-functional ambulator    [] Scooter/POV  [] can safely operate  [] can safely transfer   [] has adequate trunk stability  [] cannot functionally propel manual w/c  [x] Power Mobility Base  [x] non-ambulatory  [x] cannot functionally propel manual wheelchair  [x]  cannot functionally and safely operate scooter/POV [x] can safely operate and willing to  [] Stroller Base [] infant/child  [] unable to propel manual wheelchair [] allows for growth [] non-functional ambulator [] non-functional UE [] Indep mobility is not a goal at this time  [] Tilt  [] Forward                   [] Backward                  [] Powered tilt              [] Manual tilt  [] change position against gravitational force on head and shoulders  [] change position for pressure relief/cannot weight shift [] transfers  [] management of tone [] rest periods [] control edema [] facilitate postural control  []                                       [] Recline  [] Power recline on power base [] Manual recline on manual base  [] accommodate femur to back angle  [] bring to full recline for ADL care  [] change position for pressure relief/cannot weight shift [] rest periods [] repositioning for transfers or clothing/diaper /catheter  changes [] head positioning  [] Lighter weight required [] self- propulsion  [] lifting []                                                 [] Heavy Duty required [] user weight greater than 250# [] extreme tone/ over active movement [] broken frame on previous chair []                                     []  Back  []  Angle Adjustable []  Custom molded                           [] postural control [] control of tone/spasticity [] accommodation  of range of motion [] UE functional control [] accommodation for seating system []                                          [] provide lateral trunk support [] accommodate deformity [] provide posterior trunk support [] provide lumbar/sacral support [] support trunk in midline [] Pressure relief over spinal processes  []  Seat Cushion                       [] impaired sensation  [] decubitus ulcers present [] history of pressure ulceration [] prevent pelvic extension [] low maintenance  [] stabilize pelvis  [] accommodate obliquity [] accommodate multiple deformity [] neutralize lower extremity position [] increase pressure distribution []                                           []  Pelvic/thigh support  []  Lateral thigh guide []  Distal medial pad  []  Distal lateral pad []  pelvis in neutral [] accommodate pelvis []  position upper legs []  alignment []  accommodate ROM []  decrease adduction [] accommodate tone [] removable for transfers [] decrease abduction  []  Lateral trunk Supports []  Lt     []  Rt [] decrease lateral trunk leaning [] control tone [] contour for increased contact [] safety  [] accommodate asymmetry []                                                 []  Mounting hardware  [] lateral trunk supports  [] back   [] seat [] headrest      []  thigh support [] fixed   [] swing away [] attach seat platform/cushion to w/c frame [] attach back cushion to w/c frame [] mount postural supports [] mount headrest  [] swing medial thigh support away [] swing lateral supports  away for transfers  []                                                     Armrests  [] fixed [x] adjustable height [] removable   [] swing away  [x] flip back   [] reclining [x] full length pads [] desk    [] pads tubular  [x] provide support with elbow at 90   [] provide support for w/c tray [x] change of height/angles for variable activities [x] remove for transfers [] allow to come closer to table top [] remove for access to tables []                                               Hangers/ Leg rests  [] 60 [] 70 [] 90 [] elevating [] heavy duty  [] articulating [] fixed [] lift off [] swing away     [] power [] provide LE support  [] accommodate to hamstring tightness [] elevate legs during recline   [] provide change in position for Legs [] Maintain placement of feet on footplate [] durability [] enable transfers [] decrease edema [] Accommodate lower leg length []   Foot support Footplate    [] Lt  []  Rt  [x]  Center mount [x] flip up                            [] depth/angle adjustable [] Amputee adapter    []  Lt     []  Rt [x] provide foot support [] accommodate to ankle ROM [x] transfers [] Provide support for residual extremity []  allow foot to go under wheelchair base []  decrease tone  []                                                 []  Ankle strap/heel loops [] support foot on foot support [] decrease extraneous movement [] provide input to heel  [] protect foot  Tires: [] pneumatic  [] flat free inserts  [] solid  [] decrease maintenance  [] prevent frequent flats [] increase shock absorbency [] decrease pain from road shock [] decrease spasms from road shock []                                              []  Headrest  [] provide posterior head support [] provide posterior neck support [] provide lateral head support [] provide anterior head support [] support during tilt and recline [] improve feeding   [] improve respiration [] placement of switches [] safety  [] accommodate  ROM  [] accommodate tone [] improve visual orientation  []  Anterior chest strap []  Vest []  Shoulder retractors  [] decrease forward movement of shoulder [] accommodation of TLSO [] decrease forward movement of trunk [] decrease shoulder elevation [] added abdominal support [] alignment [] assistance with shoulder control  []                                               Pelvic Positioner [x] Belt [] SubASIS bar [] Dual Pull [] stabilize tone [x] decrease falling out of chair/ **will not Decrease potential for sliding due to pelvic tilting [] prevent excessive rotation [] pad for protection over boney prominence [] prominence comfort [] special pull angle to control rotation []                                                  Upper ExtremitySupport  [] L   []  R [] Arm trough   [] hand support []  tray       [] full tray [] swivel mount [] decrease edema      [] decrease subluxation   [] control tone   [] placement for AAC/Computer/EADL [] decrease gravitational pull on shoulders [] provide midline positioning [] provide support to increase UE function [] provide hand support in natural position [] provide work surface   POWER WHEELCHAIR CONTROLS  [] Proportional  [] Non-Proportional Type                                      [] Left  [] Right [] provides access for controlling wheelchair   [] lacks motor control to operate proportional drive control [] unable to understand proportional controls  Actuator Control Module  [] Single  [] Multiple   [] Allow the client to operate the power seat function(s) through the joystick control   []   Safety Reset Switches [] Used to change modes and stop the wheelchair when driving in latch mode    [] Upgraded Electronics   [] programming for accurate control [] progressive Disease/changing condition [] non-proportional drive control needed [] Needed in order to operate power seat functions through joystick control   [] Display box [] Allows user to see in which mode and drive the  wheelchair is set  [] necessary for alternate controls    [] Digital interface electronics [] Allows w/c to operate when using alternative drive controls  [] ASL Head Array [] Allows client to operate wheelchair  through switches placed in tri-panel headrest  [] Sip and puff with tubing kit [] needed to operate sip and puff drive controls  [] Upgraded tracking electronics [] increase safety when driving [] correct tracking when on uneven surfaces  [x] Mount for switches or joystick [] Attaches switches to w/c  [x] Swing away for access or transfers [] midline for optimal placement [] provides for consistent access  [] Attendant controlled joystick plus mount [] safety [] long distance driving [] operation of seat functions [] compliance with transportation regulations []                                             Rear wheel placement/Axle adjustability [] None [] semi adjustable [] fully adjustable  [] improved UE access to wheels [] improved stability [] changing angle in space for improvement of postural stability [] 1-arm drive access [] amputee pad placement []                                Wheel rims/ hand rims  [] metal   [] plastic coated [] oblique projections           [] vertical projections [] Provide ability to propel manual wheelchair  []  Increase self-propulsion with hand weakness/decreased grasp  Push handles [] extended   [] angle adjustable              [] standard [] caregiver access [] caregiver assist [] allows "hooking" to enable increased ability to perform ADLs or maintain balance  One armed device   [] Lt   [] Rt [] enable propulsion of manual wheelchair with one arm   []                                            Brake/wheel lock extension []  Lt   []  Rt [] increase indep in applying wheel locks   [] Side guards [] prevent clothing getting caught in wheel or becoming soiled []  prevent skin tears/abrasions  Battery: U1                                           [x] to power wheelchair                                                          Other:  The above equipment has a life- long use expectancy. Growth and changes in medical and/or functional conditions would be the exceptions. This is to certify that the therapist has no financial relationship with durable medical provider or manufacturer. The therapist will not receive remuneration of any kind for the equipment recommended in this evaluation.   Patient has mobility limitation that significantly impairs safe, timely participation in one or more mobility related ADL's. (bathing, toileting, feeding, dressing, grooming, moving from room to room)  [x]  Yes []  No  Will mobility device sufficiently improve ability to participate and/or be aided in participation of MRADL's?      [x]  Yes []  No  Can limitation be compensated for with use of a cane or walker?                                    []  Yes [x]  No  Does patient or caregiver demonstrate ability/potential ability & willingness to safely use the mobility device?    [x]  Yes []  No  Does patient's home environment support use of recommended mobility device?            [x]  Yes []  No  Does patient have sufficient upper extremity function necessary to functionally propel a manual wheelchair?     []  Yes [x]  No  Does patient have sufficient strength and trunk stability to safely operate a POV (scooter)?                                  []  Yes [x]  No  Does patient need additional features/benefits provided by a power wheelchair for MRADL's in the home?        []  Yes [x]  No  Does the patient demonstrate the ability to safely use a power wheelchair?                   [x]  Yes []  No     Physician's Name Printed:                                                        Physician's Signature:  Date:     This is to certify that I, the above signed therapist have the following  affiliations: []  This DME provider []  Manufacturer of recommended equipment []  Patient's long term care facility [x]  None of the above  Therapist Name/Signature: Ricard Dillon PT, DPT                                            Date: 01/27/2022  ASSESSMENT:  CLINICAL IMPRESSION: The pt is a pleasant 71 yo female evaluated for a power wheelchair. Pt with complex medical hx, multiple comorbidites likely contributing to impaired balance/gait, with further decline in mobility following c-spine fusion in 2022. Pt with unresolved, chronic dizziness and a hx of 2 recent falls: one attempting to use RW in home, the other a fall from her bed attempting to reach RW. 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 Pt also with hx of urinary incontinence/accidents when attempting to make it to the bathroom while using RW, placing her at an even greater risk of falls or development of possible skin breakdown in the future. Due to these impairments, the pt will require a Go Chair power chair to maximize safe mobility with ADLs and to decrease fall risk. Please refer to mobility base recommendations and justifications below for selected features that are necessary to maximize safety and to decrease fall risk.           OBJECTIVE IMPAIRMENTS Abnormal gait, decreased activity tolerance, decreased balance, decreased coordination, decreased endurance, decreased mobility, difficulty walking, decreased strength, dizziness, increased edema, impaired sensation, impaired UE functional use, improper body mechanics, and postural dysfunction.   ACTIVITY LIMITATIONS carrying, lifting, bending, sitting, standing, squatting, stairs, transfers, continence, bathing, toileting, dressing, hygiene/grooming, and locomotion level  PARTICIPATION LIMITATIONS: meal prep,  cleaning, laundry, driving, shopping, community activity, and yard work  PERSONAL FACTORS Age, Fitness, Past/current experiences, Sex, Time since onset of injury/illness/exacerbation, and 3+ comorbidities: PMH is significant for kidney transplant (in 2000 and 2010), anemia and chronic renal failure, a-fib, B12 deficiency, DM with neuropathy, ESRD, HLD, HTN, osteoporosis, pyelonephritis, spinal stenosis of cervical region.  are also affecting patient's functional outcome.   REHAB POTENTIAL: Fair    CLINICAL DECISION MAKING: Evolving/moderate complexity  EVALUATION COMPLEXITY: Moderate  GOALS: Target date:  01/26/2022     Complete power chair evaluation and understand current recommendations Baseline: completed, pt understands and is agreeable to recommendations Goal status: MET   PLAN: PT FREQUENCY: one time visit  PT DURATION: other: 01/26/2022, one time visit  PLANNED INTERVENTIONS: Therapeutic exercises, Therapeutic activity, Neuromuscular re-education, Balance training, Gait training, Patient/Family education, Self Care, and WC eval .  PLAN FOR NEXT SESSION: WC evaluation completed on this date, 1x visit.   Zollie Pee, PT 01/27/2022, 10:46 AM

## 2022-02-02 ENCOUNTER — Other Ambulatory Visit: Payer: 59

## 2022-02-02 ENCOUNTER — Inpatient Hospital Stay: Admission: RE | Admit: 2022-02-02 | Payer: 59 | Source: Ambulatory Visit

## 2022-02-03 ENCOUNTER — Other Ambulatory Visit: Payer: Self-pay | Admitting: Neurology

## 2022-02-03 DIAGNOSIS — R42 Dizziness and giddiness: Secondary | ICD-10-CM

## 2022-02-03 DIAGNOSIS — R2689 Other abnormalities of gait and mobility: Secondary | ICD-10-CM

## 2022-02-09 ENCOUNTER — Other Ambulatory Visit: Payer: Self-pay | Admitting: Neurology

## 2022-02-09 DIAGNOSIS — I729 Aneurysm of unspecified site: Secondary | ICD-10-CM

## 2022-02-25 ENCOUNTER — Ambulatory Visit
Admission: RE | Admit: 2022-02-25 | Discharge: 2022-02-25 | Disposition: A | Discharge status: Home / Self Care | Payer: Medicare Other | Source: Ambulatory Visit | Attending: Infectious Diseases | Admitting: Infectious Diseases

## 2022-02-25 DIAGNOSIS — R928 Other abnormal and inconclusive findings on diagnostic imaging of breast: Secondary | ICD-10-CM

## 2022-02-25 DIAGNOSIS — N63 Unspecified lump in unspecified breast: Secondary | ICD-10-CM

## 2022-03-01 ENCOUNTER — Other Ambulatory Visit: Payer: Self-pay | Admitting: Family Medicine

## 2022-03-01 DIAGNOSIS — N63 Unspecified lump in unspecified breast: Secondary | ICD-10-CM

## 2022-03-01 DIAGNOSIS — R928 Other abnormal and inconclusive findings on diagnostic imaging of breast: Secondary | ICD-10-CM

## 2022-03-03 ENCOUNTER — Other Ambulatory Visit: Payer: Self-pay | Admitting: Neurology

## 2022-03-03 ENCOUNTER — Other Ambulatory Visit (HOSPITAL_COMMUNITY): Payer: Self-pay | Admitting: Neurology

## 2022-03-03 DIAGNOSIS — R42 Dizziness and giddiness: Secondary | ICD-10-CM

## 2022-03-04 ENCOUNTER — Other Ambulatory Visit: Payer: Self-pay | Admitting: Neurology

## 2022-03-04 DIAGNOSIS — R251 Tremor, unspecified: Secondary | ICD-10-CM

## 2022-03-04 DIAGNOSIS — R2689 Other abnormalities of gait and mobility: Secondary | ICD-10-CM

## 2022-03-15 ENCOUNTER — Ambulatory Visit
Admission: RE | Admit: 2022-03-15 | Discharge: 2022-03-15 | Disposition: A | Discharge status: Home / Self Care | Payer: Medicare Other | Source: Ambulatory Visit | Attending: Family Medicine | Admitting: Family Medicine

## 2022-03-15 DIAGNOSIS — R928 Other abnormal and inconclusive findings on diagnostic imaging of breast: Secondary | ICD-10-CM

## 2022-03-15 DIAGNOSIS — N63 Unspecified lump in unspecified breast: Secondary | ICD-10-CM | POA: Insufficient documentation

## 2022-03-16 LAB — SURGICAL PATHOLOGY

## 2022-03-29 ENCOUNTER — Other Ambulatory Visit: Payer: Self-pay | Admitting: Family Medicine

## 2022-03-29 DIAGNOSIS — M5136 Other intervertebral disc degeneration, lumbar region: Secondary | ICD-10-CM

## 2022-03-29 DIAGNOSIS — M5416 Radiculopathy, lumbar region: Secondary | ICD-10-CM

## 2022-04-08 ENCOUNTER — Other Ambulatory Visit: Payer: Medicare Other

## 2022-04-14 ENCOUNTER — Emergency Department: Payer: Medicare Other

## 2022-04-14 ENCOUNTER — Encounter: Payer: Self-pay | Admitting: Emergency Medicine

## 2022-04-14 ENCOUNTER — Other Ambulatory Visit: Payer: Self-pay

## 2022-04-14 ENCOUNTER — Observation Stay: Payer: Medicare Other

## 2022-04-14 ENCOUNTER — Inpatient Hospital Stay
Admission: EM | Admit: 2022-04-14 | Discharge: 2022-04-19 | DRG: 698 | Disposition: A | Discharge status: Home Health | Payer: Medicare Other | Attending: Internal Medicine | Admitting: Internal Medicine

## 2022-04-14 DIAGNOSIS — I482 Chronic atrial fibrillation, unspecified: Secondary | ICD-10-CM | POA: Diagnosis present

## 2022-04-14 DIAGNOSIS — Z880 Allergy status to penicillin: Secondary | ICD-10-CM

## 2022-04-14 DIAGNOSIS — I130 Hypertensive heart and chronic kidney disease with heart failure and stage 1 through stage 4 chronic kidney disease, or unspecified chronic kidney disease: Secondary | ICD-10-CM | POA: Diagnosis present

## 2022-04-14 DIAGNOSIS — R55 Syncope and collapse: Secondary | ICD-10-CM | POA: Diagnosis not present

## 2022-04-14 DIAGNOSIS — T8619 Other complication of kidney transplant: Secondary | ICD-10-CM | POA: Diagnosis not present

## 2022-04-14 DIAGNOSIS — M810 Age-related osteoporosis without current pathological fracture: Secondary | ICD-10-CM | POA: Diagnosis present

## 2022-04-14 DIAGNOSIS — E785 Hyperlipidemia, unspecified: Secondary | ICD-10-CM | POA: Diagnosis present

## 2022-04-14 DIAGNOSIS — D84821 Immunodeficiency due to drugs: Secondary | ICD-10-CM | POA: Diagnosis present

## 2022-04-14 DIAGNOSIS — E860 Dehydration: Secondary | ICD-10-CM | POA: Diagnosis present

## 2022-04-14 DIAGNOSIS — I5033 Acute on chronic diastolic (congestive) heart failure: Secondary | ICD-10-CM | POA: Diagnosis not present

## 2022-04-14 DIAGNOSIS — R338 Other retention of urine: Secondary | ICD-10-CM

## 2022-04-14 DIAGNOSIS — E876 Hypokalemia: Secondary | ICD-10-CM | POA: Diagnosis present

## 2022-04-14 DIAGNOSIS — Z87442 Personal history of urinary calculi: Secondary | ICD-10-CM

## 2022-04-14 DIAGNOSIS — Z7960 Long term (current) use of unspecified immunomodulators and immunosuppressants: Secondary | ICD-10-CM

## 2022-04-14 DIAGNOSIS — Y830 Surgical operation with transplant of whole organ as the cause of abnormal reaction of the patient, or of later complication, without mention of misadventure at the time of the procedure: Secondary | ICD-10-CM | POA: Diagnosis present

## 2022-04-14 DIAGNOSIS — I5A Non-ischemic myocardial injury (non-traumatic): Secondary | ICD-10-CM | POA: Diagnosis present

## 2022-04-14 DIAGNOSIS — D631 Anemia in chronic kidney disease: Secondary | ICD-10-CM | POA: Diagnosis present

## 2022-04-14 DIAGNOSIS — Z7901 Long term (current) use of anticoagulants: Secondary | ICD-10-CM

## 2022-04-14 DIAGNOSIS — R339 Retention of urine, unspecified: Secondary | ICD-10-CM | POA: Diagnosis present

## 2022-04-14 DIAGNOSIS — E1129 Type 2 diabetes mellitus with other diabetic kidney complication: Secondary | ICD-10-CM | POA: Diagnosis present

## 2022-04-14 DIAGNOSIS — Z794 Long term (current) use of insulin: Secondary | ICD-10-CM

## 2022-04-14 DIAGNOSIS — E162 Hypoglycemia, unspecified: Secondary | ICD-10-CM

## 2022-04-14 DIAGNOSIS — E872 Acidosis, unspecified: Secondary | ICD-10-CM | POA: Diagnosis present

## 2022-04-14 DIAGNOSIS — U071 COVID-19: Secondary | ICD-10-CM | POA: Diagnosis present

## 2022-04-14 DIAGNOSIS — R42 Dizziness and giddiness: Principal | ICD-10-CM

## 2022-04-14 DIAGNOSIS — Z881 Allergy status to other antibiotic agents status: Secondary | ICD-10-CM

## 2022-04-14 DIAGNOSIS — I4892 Unspecified atrial flutter: Secondary | ICD-10-CM | POA: Diagnosis present

## 2022-04-14 DIAGNOSIS — R531 Weakness: Secondary | ICD-10-CM

## 2022-04-14 DIAGNOSIS — Z981 Arthrodesis status: Secondary | ICD-10-CM

## 2022-04-14 DIAGNOSIS — E11649 Type 2 diabetes mellitus with hypoglycemia without coma: Secondary | ICD-10-CM | POA: Diagnosis present

## 2022-04-14 DIAGNOSIS — J208 Acute bronchitis due to other specified organisms: Secondary | ICD-10-CM | POA: Diagnosis present

## 2022-04-14 DIAGNOSIS — I700 Atherosclerosis of aorta: Secondary | ICD-10-CM | POA: Diagnosis present

## 2022-04-14 DIAGNOSIS — I10 Essential (primary) hypertension: Secondary | ICD-10-CM

## 2022-04-14 DIAGNOSIS — E1140 Type 2 diabetes mellitus with diabetic neuropathy, unspecified: Secondary | ICD-10-CM | POA: Diagnosis present

## 2022-04-14 DIAGNOSIS — Z8249 Family history of ischemic heart disease and other diseases of the circulatory system: Secondary | ICD-10-CM

## 2022-04-14 DIAGNOSIS — N179 Acute kidney failure, unspecified: Secondary | ICD-10-CM | POA: Diagnosis present

## 2022-04-14 DIAGNOSIS — E1122 Type 2 diabetes mellitus with diabetic chronic kidney disease: Secondary | ICD-10-CM | POA: Diagnosis present

## 2022-04-14 DIAGNOSIS — Z7952 Long term (current) use of systemic steroids: Secondary | ICD-10-CM

## 2022-04-14 DIAGNOSIS — Z961 Presence of intraocular lens: Secondary | ICD-10-CM | POA: Diagnosis present

## 2022-04-14 DIAGNOSIS — N1831 Chronic kidney disease, stage 3a: Secondary | ICD-10-CM | POA: Diagnosis present

## 2022-04-14 DIAGNOSIS — Z833 Family history of diabetes mellitus: Secondary | ICD-10-CM

## 2022-04-14 DIAGNOSIS — Z940 Kidney transplant status: Secondary | ICD-10-CM

## 2022-04-14 DIAGNOSIS — Z9842 Cataract extraction status, left eye: Secondary | ICD-10-CM

## 2022-04-14 DIAGNOSIS — Z79899 Other long term (current) drug therapy: Secondary | ICD-10-CM

## 2022-04-14 LAB — LIPASE, BLOOD: Lipase: 36 U/L (ref 11–51)

## 2022-04-14 LAB — RESP PANEL BY RT-PCR (FLU A&B, COVID) ARPGX2
Influenza A by PCR: NEGATIVE
Influenza B by PCR: NEGATIVE
SARS Coronavirus 2 by RT PCR: POSITIVE — AB

## 2022-04-14 LAB — BASIC METABOLIC PANEL
Anion gap: 10 (ref 5–15)
BUN: 36 mg/dL — ABNORMAL HIGH (ref 8–23)
CO2: 20 mmol/L — ABNORMAL LOW (ref 22–32)
Calcium: 8.1 mg/dL — ABNORMAL LOW (ref 8.9–10.3)
Chloride: 113 mmol/L — ABNORMAL HIGH (ref 98–111)
Creatinine, Ser: 2.52 mg/dL — ABNORMAL HIGH (ref 0.44–1.00)
GFR, Estimated: 20 mL/min — ABNORMAL LOW (ref >60)
Glucose, Bld: 67 mg/dL — ABNORMAL LOW (ref 70–99)
Potassium: 3.4 mmol/L — ABNORMAL LOW (ref 3.5–5.1)
Sodium: 143 mmol/L (ref 135–145)

## 2022-04-14 LAB — CBG MONITORING, ED
Glucose-Capillary: 50 mg/dL — ABNORMAL LOW (ref 70–99)
Glucose-Capillary: 53 mg/dL — ABNORMAL LOW (ref 70–99)
Glucose-Capillary: 163 mg/dL — ABNORMAL HIGH (ref 70–99)
Glucose-Capillary: 131 mg/dL — ABNORMAL HIGH (ref 70–99)
Glucose-Capillary: 114 mg/dL — ABNORMAL HIGH (ref 70–99)
Glucose-Capillary: 125 mg/dL — ABNORMAL HIGH (ref 70–99)

## 2022-04-14 LAB — MAGNESIUM: Magnesium: 2 mg/dL (ref 1.7–2.4)

## 2022-04-14 LAB — LACTIC ACID, PLASMA
Lactic Acid, Venous: 1.9 mmol/L (ref 0.5–1.9)
Lactic Acid, Venous: 1.2 mmol/L (ref 0.5–1.9)

## 2022-04-14 LAB — TROPONIN I (HIGH SENSITIVITY)
Troponin I (High Sensitivity): 145 ng/L (ref <18)
Troponin I (High Sensitivity): 120 ng/L (ref <18)
Troponin I (High Sensitivity): 81 ng/L — ABNORMAL HIGH (ref <18)

## 2022-04-14 LAB — CBC WITH DIFFERENTIAL/PLATELET
Abs Immature Granulocytes: 0.02 10*3/uL (ref 0.00–0.07)
Basophils Absolute: 0 10*3/uL (ref 0.0–0.1)
Basophils Relative: 0 %
Eosinophils Absolute: 0 10*3/uL (ref 0.0–0.5)
Eosinophils Relative: 0 %
HCT: 39.6 % (ref 36.0–46.0)
Hemoglobin: 12.2 g/dL (ref 12.0–15.0)
Immature Granulocytes: 0 %
Lymphocytes Relative: 35 %
Lymphs Abs: 1.7 10*3/uL (ref 0.7–4.0)
MCH: 30 pg (ref 26.0–34.0)
MCHC: 30.8 g/dL (ref 30.0–36.0)
MCV: 97.3 fL (ref 80.0–100.0)
Monocytes Absolute: 0.5 10*3/uL (ref 0.1–1.0)
Monocytes Relative: 11 %
Neutro Abs: 2.5 10*3/uL (ref 1.7–7.7)
Neutrophils Relative %: 54 %
Platelets: 180 10*3/uL (ref 150–400)
RBC: 4.07 MIL/uL (ref 3.87–5.11)
RDW: 14.5 % (ref 11.5–15.5)
WBC: 4.8 10*3/uL (ref 4.0–10.5)
nRBC: 0 % (ref 0.0–0.2)

## 2022-04-14 LAB — HEPATIC FUNCTION PANEL
ALT: 9 U/L (ref 0–44)
AST: 21 U/L (ref 15–41)
Albumin: 2.9 g/dL — ABNORMAL LOW (ref 3.5–5.0)
Alkaline Phosphatase: 55 U/L (ref 38–126)
Bilirubin, Direct: <0.1 mg/dL (ref 0.0–0.2)
Total Bilirubin: 0.4 mg/dL (ref 0.3–1.2)
Total Protein: 5.8 g/dL — ABNORMAL LOW (ref 6.5–8.1)

## 2022-04-14 LAB — HEMOGLOBIN A1C
Hgb A1c MFr Bld: 5.7 % — ABNORMAL HIGH (ref 4.8–5.6)
Mean Plasma Glucose: 116.89 mg/dL

## 2022-04-14 LAB — GLUCOSE, CAPILLARY: Glucose-Capillary: 328 mg/dL — ABNORMAL HIGH (ref 70–99)

## 2022-04-14 LAB — PROCALCITONIN: Procalcitonin: <0.1 ng/mL

## 2022-04-14 MED ORDER — EZETIMIBE 10 MG PO TABS
10.0000 mg | ORAL_TABLET | Freq: Every day | ORAL | Status: DC
  Administered 2022-04-15 – 2022-04-19 (×5): 10 mg via ORAL
  Filled 2022-04-14 (×5): qty 1

## 2022-04-14 MED ORDER — MYCOPHENOLATE MOFETIL 250 MG PO CAPS
500.0000 mg | ORAL_CAPSULE | Freq: Two times a day (BID) | ORAL | Status: DC
  Administered 2022-04-14 – 2022-04-19 (×10): 500 mg via ORAL
  Filled 2022-04-14 (×10): qty 2

## 2022-04-14 MED ORDER — INSULIN ASPART 100 UNIT/ML IJ SOLN
0.0000 [IU] | Freq: Every day | INTRAMUSCULAR | Status: DC
  Administered 2022-04-14: 5 [IU] via SUBCUTANEOUS
  Filled 2022-04-14 (×2): qty 1

## 2022-04-14 MED ORDER — HYDRALAZINE HCL 20 MG/ML IJ SOLN: 5.0000 mg | INTRAMUSCULAR | Status: DC | PRN

## 2022-04-14 MED ORDER — SODIUM CHLORIDE 0.9 % IV BOLUS
1000.0000 mL | Freq: Once | INTRAVENOUS | Status: AC
  Administered 2022-04-14: 1000 mL via INTRAVENOUS

## 2022-04-14 MED ORDER — DEXTROSE 50 % IV SOLN
INTRAVENOUS | Status: AC
  Filled 2022-04-14: qty 50

## 2022-04-14 MED ORDER — TACROLIMUS 1 MG PO CAPS
3.0000 mg | ORAL_CAPSULE | Freq: Two times a day (BID) | ORAL | Status: DC
  Administered 2022-04-14 – 2022-04-19 (×10): 3 mg via ORAL
  Filled 2022-04-14 (×10): qty 3

## 2022-04-14 MED ORDER — DILTIAZEM HCL ER COATED BEADS 240 MG PO CP24
240.0000 mg | ORAL_CAPSULE | Freq: Every day | ORAL | Status: DC
  Administered 2022-04-14 – 2022-04-18 (×5): 240 mg via ORAL
  Filled 2022-04-14 (×5): qty 1

## 2022-04-14 MED ORDER — DEXTROSE 50 % IV SOLN
1.0000 | Freq: Once | INTRAVENOUS | Status: AC
  Administered 2022-04-14: 50 mL via INTRAVENOUS

## 2022-04-14 MED ORDER — ACETAMINOPHEN 325 MG PO TABS
650.0000 mg | ORAL_TABLET | Freq: Four times a day (QID) | ORAL | Status: DC | PRN
  Administered 2022-04-17: 650 mg via ORAL
  Filled 2022-04-14 (×2): qty 2

## 2022-04-14 MED ORDER — PREDNISONE 20 MG PO TABS
40.0000 mg | ORAL_TABLET | Freq: Once | ORAL | Status: AC
  Administered 2022-04-14: 40 mg via ORAL
  Filled 2022-04-14: qty 2

## 2022-04-14 MED ORDER — POTASSIUM CHLORIDE CRYS ER 20 MEQ PO TBCR
20.0000 meq | EXTENDED_RELEASE_TABLET | Freq: Once | ORAL | Status: AC
  Administered 2022-04-14: 20 meq via ORAL
  Filled 2022-04-14: qty 1

## 2022-04-14 MED ORDER — VITAMIN B-12 1000 MCG PO TABS
2000.0000 ug | ORAL_TABLET | Freq: Every day | ORAL | Status: DC
  Administered 2022-04-15 – 2022-04-19 (×5): 2000 ug via ORAL
  Filled 2022-04-14 (×5): qty 2

## 2022-04-14 MED ORDER — FLECAINIDE ACETATE 50 MG PO TABS
50.0000 mg | ORAL_TABLET | Freq: Two times a day (BID) | ORAL | Status: DC
  Administered 2022-04-14 – 2022-04-19 (×9): 50 mg via ORAL
  Filled 2022-04-14 (×6): qty 1
  Filled 2022-04-14: qty 0.5
  Filled 2022-04-14 (×3): qty 1

## 2022-04-14 MED ORDER — ONDANSETRON HCL 4 MG/2ML IJ SOLN
4.0000 mg | Freq: Three times a day (TID) | INTRAMUSCULAR | Status: DC | PRN
  Administered 2022-04-18: 4 mg via INTRAVENOUS
  Filled 2022-04-14 (×2): qty 2

## 2022-04-14 MED ORDER — ALBUTEROL SULFATE HFA 108 (90 BASE) MCG/ACT IN AERS: 2.0000 | INHALATION_SPRAY | RESPIRATORY_TRACT | Status: DC | PRN

## 2022-04-14 MED ORDER — LACTATED RINGERS IV SOLN: INTRAVENOUS | Status: DC

## 2022-04-14 MED ORDER — SODIUM CHLORIDE 0.9 % IV SOLN: INTRAVENOUS | Status: DC

## 2022-04-14 MED ORDER — DM-GUAIFENESIN ER 30-600 MG PO TB12
1.0000 | ORAL_TABLET | Freq: Two times a day (BID) | ORAL | Status: DC | PRN
  Administered 2022-04-18: 1 via ORAL
  Filled 2022-04-14 (×2): qty 1

## 2022-04-14 MED ORDER — PREGABALIN 25 MG PO CAPS
25.0000 mg | ORAL_CAPSULE | Freq: Two times a day (BID) | ORAL | Status: DC
  Administered 2022-04-14 – 2022-04-19 (×10): 25 mg via ORAL
  Filled 2022-04-14 (×10): qty 1

## 2022-04-14 MED ORDER — ATORVASTATIN CALCIUM 20 MG PO TABS
10.0000 mg | ORAL_TABLET | Freq: Every day | ORAL | Status: DC
  Administered 2022-04-14 – 2022-04-19 (×6): 10 mg via ORAL
  Filled 2022-04-14 (×6): qty 1

## 2022-04-14 MED ORDER — LACTATED RINGERS IV SOLN: INTRAVENOUS | Status: AC

## 2022-04-14 MED ORDER — MECLIZINE HCL 25 MG PO TABS: 12.5000 mg | ORAL_TABLET | Freq: Three times a day (TID) | ORAL | Status: DC | PRN

## 2022-04-14 MED ORDER — PREDNISONE 10 MG PO TABS
5.0000 mg | ORAL_TABLET | Freq: Every day | ORAL | Status: DC
  Administered 2022-04-15 – 2022-04-18 (×4): 5 mg via ORAL
  Filled 2022-04-14 (×4): qty 1

## 2022-04-14 MED ORDER — ONDANSETRON HCL 4 MG/2ML IJ SOLN
4.0000 mg | Freq: Once | INTRAMUSCULAR | Status: AC
  Administered 2022-04-14: 4 mg via INTRAVENOUS
  Filled 2022-04-14: qty 2

## 2022-04-14 MED ORDER — APIXABAN 2.5 MG PO TABS
2.5000 mg | ORAL_TABLET | Freq: Two times a day (BID) | ORAL | Status: DC
  Administered 2022-04-14 – 2022-04-19 (×10): 2.5 mg via ORAL
  Filled 2022-04-14 (×11): qty 1

## 2022-04-14 MED ORDER — INSULIN ASPART 100 UNIT/ML IJ SOLN
0.0000 [IU] | Freq: Three times a day (TID) | INTRAMUSCULAR | Status: DC
  Administered 2022-04-15: 3 [IU] via SUBCUTANEOUS
  Administered 2022-04-15: 5 [IU] via SUBCUTANEOUS
  Administered 2022-04-15 – 2022-04-18 (×3): 2 [IU] via SUBCUTANEOUS
  Administered 2022-04-19: 1 [IU] via SUBCUTANEOUS
  Administered 2022-04-19: 2 [IU] via SUBCUTANEOUS
  Filled 2022-04-14 (×8): qty 1

## 2022-04-14 NOTE — Assessment & Plan Note (Signed)
Recent A1c 5.9, well controlled.  Patient is  taking Humalog and Levemir 8 units daily

## 2022-04-14 NOTE — ED Notes (Signed)
Culture bottles not available at this moment. Not able to collect blood cultures. CN made aware.

## 2022-04-14 NOTE — Progress Notes (Signed)
PT Cancellation Note  Patient Details Name: Rossetta Kama MRN: [id number] DOB: 02-25-1951   Cancelled Treatment:    Reason Eval/Treat Not Completed: Medical issues which prohibited therapy. Order received, patient with RR in 30-40 range. Will hold eval until tomorrow.     Jeovanny Cuadros 04/14/2022, 2:37 PM

## 2022-04-14 NOTE — H&P (Signed)
History and Physical    Jean Johnson YBO:[id number] DOB: June 18, 1951 DOA: 04/14/2022  Referring MD/NP/PA:   PCP: Gladstone Lighter, MD   Patient coming from:  The patient is coming from home.     Chief Complaint: dizziness, weakness and syncope  HPI: Jean Johnson is a 71 y.o. 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  Pt was diagnosed with COVID on 04/04/2022. Pt was treated with a course of doxycycline and prednisone by PCP. She states that she has chronic dizziness. She was seen by ENT and ophthalmology with negative work-up. She had MRI of brain on  08/06/21 which was negative for stroke. Her neurologist is referring her to [facility] for further work-up of dizziness. She continues to have dizziness and lightheadedness. She endorses sensation of feeling lightheaded as well as room spinning.  No unilateral numbness in extremities.  No facial droop or slurred speech.  Patient has mild dry cough, denies chest pain, shortness breath, fever or chills.  Patient had nausea and vomited twice yesterday which has resolved.  Currently no nausea vomiting, diarrhea or abdominal pain.  Denies symptoms of UTI.  Per EDP, patient had syncopal episode while in triage and was rushed back to treatment room.    Data reviewed independently and ED Course: pt was found to have WBC 4.8, troponin level 45, lactic acid of 1.9, positive COVID PCR, worsening renal function with creatinine 2.52, BUN 36, GFR 20 (baseline creatinine 1.32 ALT 03/17/2022), potassium 3.4, temperature normal, blood pressure 130/54, heart rate 79, RR 40, 28, oxygen saturation 96% on room air.  CT of head is negative.  Pt is placed on telemetry bed for observation.  Dr. Candiss Norse of renal was  consulted.  US-renal: Renal transplant is noted in left lower quadrant. No hydronephrosis or renal obstruction is noted. Increased echogenicity of of the renal parenchyma of the left renal transplant is noted suggesting medical renal disease. Atrophy of right kidney is noted consistent with end-stage renal disease.  Chest x-ray showed:  1. The appearance of the chest suggests mild congestive heart failure, as above. 2. There is also a more focal area of volume loss and potential airspace consolidation in the left lower lobe which could reflect developing infection. 3. Small left pleural effusion. 4. Aortic atherosclerosis.   EKG: I have personally reviewed.  Atrial fibrillation, QTc 475, poor R wave progression, RAD.   Review of Systems:   General: no fevers, chills, no body weight gain, has fatigue HEENT: no blurry vision, hearing changes or sore throat Respiratory: no dyspnea, has coughing, no wheezing CV: no chest pain, no palpitations GI: no nausea, vomiting, abdominal pain, diarrhea, constipation GU: no dysuria, burning on urination, increased urinary frequency, hematuria  Ext: no leg edema Neuro: no unilateral weakness, numbness, or tingling, no vision change or hearing loss. Has dizziness and syncope Skin: no rash, no skin tear. MSK: No muscle spasm, no deformity, no limitation of range of movement in spin Heme: No easy bruising.  Travel history: No recent long distant travel.   Allergy:  Allergies  Allergen Reactions   E-Mycin [Erythromycin] Swelling    And itching    Penicillins Swelling    Throat     Past Medical History:  Diagnosis Date   Anemia of chronic renal disease    Aortic stenosis, mild  a.) TTE on 11/23/2020 --> mean gradient 9.7 mmHg   Atrial fibrillation (HCC)    a.) CHA2DS2-VASc = 3 (age, sex, diabetes). b.) daily apixaban   B12 deficiency    Cervical spinal stenosis    Chronic anticoagulation    Apixaban   Diabetic neuropathy (HCC)     Diverticulosis    Dyspnea    ESRD (end stage renal disease) (HCC)    First degree AV block    HLD (hyperlipidemia)    Hypertension    Incomplete right bundle branch block (RBBB)    LAE (left atrial enlargement)    a.) TTE on 11/23/2020 --> moderate   Left thyroid nodule    a.) Cervical MRI on 12/29/2020 --> measured "at least" 3.5 cm; imcompletely imaged.   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   Valvular heart disease    a.) TTE 11/23/2020 --> mild panvalvular regurgitation    Past Surgical History:  Procedure Laterality Date   ANTERIOR CERVICAL DECOMP/DISCECTOMY FUSION N/A 03/24/2021   Procedure: C3-6 ANTERIOR CERVICAL DECOMPRESSION/DISCECTOMY FUSION 3 LEVELS;  Surgeon: Meade Maw, MD;  Location: [facility] ORS;  Service: Neurosurgery;  Laterality: N/A;   CATARACT EXTRACTION W/PHACO Left 10/21/2020   Procedure: CATARACT EXTRACTION PHACO AND INTRAOCULAR LENS PLACEMENT ([place]) DIABETES LEFT;  Surgeon: Leandrew Koyanagi, MD;  Location: [place];  Service: Ophthalmology;  Laterality: Left;  2.84 0:48.8 5.8%   COLONOSCOPY WITH PROPOFOL N/A 09/03/2021   Procedure: COLONOSCOPY WITH PROPOFOL;  Surgeon: Lesly Rubenstein, MD;  Location: [facility] END[facility];  Service: Endoscopy;  Laterality: N/A;  DM, ELIQUIS, WHEELCHAIR   EYE SURGERY Right 2020   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   Living donor organ recipient (sister)   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   Cadaveric organ recipient    Social History:  reports that she has never smoked. She has never used  smokeless tobacco. She reports that she does not currently use alcohol. She reports that she does not use drugs.  Family History:  Family History  Problem Relation Age of Onset   Diabetes Father    Heart disease Father      Prior to Admission medications   Medication Sig Start Date End Date Taking? Authorizing Provider  apixaban (ELIQUIS) 5 MG TABS tablet Take 5 mg by mouth 2 (two) times daily.    [provider]  Artificial Tear Ointment (DRY EYES OP) Place 1 drop into both eyes daily as needed (blurry Vision or floater in eyes).    [provider]  atorvastatin (LIPITOR) 10 MG tablet Take 10 mg by mouth daily.    [provider]  cyanocobalamin 2000 MCG tablet Take 2,000 mcg by mouth daily.    [provider]  diltiazem (CARDIZEM CD) 240 MG 24 hr capsule Take 240 mg by mouth at bedtime.    [provider]  ezetimibe (ZETIA) 10 MG tablet Take 10 mg by mouth daily.    [provider]  flecainide (TAMBOCOR) 100 MG tablet Take 100 mg by mouth every 12 (twelve) hours.    [provider]  insulin detemir (LEVEMIR) 100  UNIT/ML injection Inject 8 Units into the skin at bedtime.    [provider]  insulin lispro (HUMALOG) 100 UNIT/ML KwikPen Inject 0-5 Units into the skin in the morning and at bedtime. Sliding scale    [provider]  lisinopril (ZESTRIL) 30 MG tablet Take 30 mg by mouth daily.    [provider]  methocarbamol (ROBAXIN) 500 MG tablet Take 1 tablet (500 mg total) by mouth every 6 (six) hours as needed for muscle spasms. 03/27/21   Meade Maw, MD  mycophenolate (CELLCEPT) 500 MG tablet Take 500 mg by mouth 2 (two) times daily.    [provider]  Potassium Chloride ER 20 MEQ TBCR Take 20 mEq by mouth 3 (three) times daily.    [provider]  predniSONE (DELTASONE) 5 MG tablet Take 5 mg by mouth daily with breakfast.    [provider]  pregabalin (LYRICA) 25  MG capsule Take 50 mg by mouth 2 (two) times daily. 12/04/20   [provider]  senna (SENOKOT) 8.6 MG TABS tablet Take 1 tablet (8.6 mg total) by mouth 2 (two) times daily. 03/27/21   Meade Maw, MD  tacrolimus (PROGRAF) 1 MG capsule Take 3 mg by mouth 2 (two) times daily.    [provider]    Physical Exam: Vitals:   04/14/22 1604 04/14/22 1630 04/14/22 1700 04/14/22 1730  BP:  (!) 123/53 132/64 117/63  Pulse:  97 78 79  Resp:    (!) 27  Temp: 98.4 F (36.9 C)     TempSrc:      SpO2:  96% 96% 98%  Weight:      Height:       General: Not in acute distress. Dry mucus and membranes HEENT:       Eyes: PERRL, EOMI, no scleral icterus.       ENT: No discharge from the ears and nose, no pharynx injection, no tonsillar enlargement.        Neck: No JVD, no bruit, no mass felt. Heme: No neck lymph node enlargement. Cardiac: S1/S2, RRR, No gallops or rubs. Respiratory: No rales, wheezing, rhonchi or rubs. GI: Soft, nondistended, nontender, no rebound pain, no organomegaly, BS present. GU: No hematuria Ext: No pitting leg edema bilaterally. 1+DP/PT pulse bilaterally. Musculoskeletal: No joint deformities, No joint redness or warmth, no limitation of ROM in spin. Skin: No rashes.  Neuro: Alert, oriented X3, cranial nerves II-XII grossly intact, moves all extremities normally.  Psych: Patient is not psychotic, no suicidal or hemocidal ideation.  Labs on Admission: I have personally reviewed following labs and imaging studies  CBC: Recent Labs  Lab 04/14/22 0414  WBC 4.8  NEUTROABS 2.5  HGB 12.2  HCT 39.6  MCV 97.3  PLT 326   Basic Metabolic Panel: Recent Labs  Lab 04/14/22 0414  NA 143  K 3.4*  CL 113*  CO2 20*  GLUCOSE 67*  BUN 36*  CREATININE 2.52*  CALCIUM 8.1*  MG 2.0   GFR: Estimated Creatinine Clearance: 14.3 mL/min (A) (by C-G formula based on SCr of 2.52 mg/dL (H)). Liver Function Tests: Recent Labs  Lab 04/14/22 0436  AST 21   ALT 9  ALKPHOS 55  BILITOT 0.4  PROT 5.8*  ALBUMIN 2.9*   Recent Labs  Lab 04/14/22 0436  LIPASE 36   No results for input(s): "AMMONIA" in the last 168 hours. Coagulation Profile: No results for input(s): "INR", "PROTIME" in the last 168 hours. Cardiac Enzymes: No results for  input(s): "CKTOTAL", "CKMB", "CKMBINDEX", "TROPONINI" in the last 168 hours. BNP (last 3 results) No results for input(s): "PROBNP" in the last 8760 hours. HbA1C: Recent Labs    04/14/22 0832  HGBA1C 5.7*   CBG: Recent Labs  Lab 04/14/22 0626 04/14/22 0707 04/14/22 0821 04/14/22 1233 04/14/22 1717  GLUCAP 50* 163* 131* 114* 125*   Lipid Profile: No results for input(s): "CHOL", "HDL", "LDLCALC", "TRIG", "CHOLHDL", "LDLDIRECT" in the last 72 hours. Thyroid Function Tests: No results for input(s): "TSH", "T4TOTAL", "FREET4", "T3FREE", "THYROIDAB" in the last 72 hours. Anemia Panel: No results for input(s): "VITAMINB12", "FOLATE", "FERRITIN", "TIBC", "IRON", "RETICCTPCT" in the last 72 hours. Urine analysis:    Component Value Date/Time   APPEARANCEUR Hazy (A) 11/16/2020 1344   GLUCOSEU Negative 11/16/2020 1344   BILIRUBINUR Negative 11/16/2020 1344   PROTEINUR 3+ (A) 11/16/2020 1344   NITRITE Negative 11/16/2020 1344   LEUKOCYTESUR Negative 11/16/2020 1344   Sepsis Labs: @LABRCNTIP (procalcitonin:4,lacticidven:4) ) Recent Results (from the past 240 hour(s))  Resp Panel by RT-PCR (Flu A&B, Covid) Anterior Nasal Swab     Status: Abnormal   Collection Time: 04/14/22  4:53 AM   Specimen: Anterior Nasal Swab  Result Value Ref Range Status   SARS Coronavirus 2 by RT PCR POSITIVE (A) NEGATIVE Final    Comment: (NOTE) SARS-CoV-2 target nucleic acids are DETECTED.  The SARS-CoV-2 RNA is generally detectable in upper respiratory specimens during the acute phase of infection. Positive results are indicative of the presence of the identified virus, but do not rule out bacterial infection or  co-infection with other pathogens not detected by the test. Clinical correlation with patient history and other diagnostic information is necessary to determine patient infection status. The expected result is Negative.  Fact Sheet for Patients: EntrepreneurPulse.com.au  Fact Sheet for Healthcare Providers: IncredibleEmployment.be  This test is not yet approved or cleared by the [place] FDA and  has been authorized for detection and/or diagnosis of SARS-CoV-2 by FDA under an Emergency Use Authorization (EUA).  This EUA will remain in effect (meaning this test can be used) for the duration of  the COVID-19 declaration under Section 564(b)(1) of the A ct, 21 U.S.C. section 360bbb-3(b)(1), unless the authorization is terminated or revoked sooner.     Influenza A by PCR NEGATIVE NEGATIVE Final   Influenza B by PCR NEGATIVE NEGATIVE Final    Comment: (NOTE) The Xpert Xpress SARS-CoV-2/FLU/RSV plus assay is intended as an aid in the diagnosis of influenza from Nasopharyngeal swab specimens and should not be used as a sole basis for treatment. Nasal washings and aspirates are unacceptable for Xpert Xpress SARS-CoV-2/FLU/RSV testing.  Fact Sheet for Patients: EntrepreneurPulse.com.au  Fact Sheet for Healthcare Providers: IncredibleEmployment.be  This test is not yet approved or cleared by the [place] FDA and has been authorized for detection and/or diagnosis of SARS-CoV-2 by FDA under an Emergency Use Authorization (EUA). This EUA will remain in effect (meaning this test can be used) for the duration of the COVID-19 declaration under Section 564(b)(1) of the Act, 21 U.S.C. section 360bbb-3(b)(1), unless the authorization is terminated or revoked.  Performed at [facility], [address] [place], [place] [address]      Radiological Exams on Admission: US RENAL  Result Date:  04/14/2022 CLINICAL DATA:  Acute kidney injury, history of renal transplant. EXAM: RENAL / URINARY TRACT ULTRASOUND COMPLETE COMPARISON:  January 08, 2021. FINDINGS: Right Kidney: Renal measurements: 7.5 x 3.5 x 3.4 cm = volume: 46 mL. Increased echogenicity  of renal parenchyma is noted suggesting medical renal disease. 1.9 cm simple cyst is noted in upper pole. No mass or hydronephrosis visualized. Left lower quadrant renal transplant: Renal measurements: 10.0 x 6.0 x 4.4 cm = volume: 139 mL. Mildly increased echogenicity of renal parenchyma is noted. No mass or hydronephrosis visualized. Bladder: Appears normal for degree of bladder distention. Other: None. IMPRESSION: Renal transplant is noted in left lower quadrant. No hydronephrosis or renal obstruction is noted. Increased echogenicity of of the renal parenchyma of the left renal transplant is noted suggesting medical renal disease. Atrophy of right kidney is noted consistent with end-stage renal disease. Electronically Signed   By: Marijo Conception M.D.   On: 04/14/2022 09:10   DG Chest Port 1 View  Result Date: 04/14/2022 CLINICAL DATA:  72 year old female with history of dizziness. Recent history of COVID infection. EXAM: PORTABLE CHEST 1 VIEW COMPARISON:  No priors. FINDINGS: Lung volumes are normal. Opacity at the left base which may reflect atelectasis and/or consolidation, with superimposed small left pleural effusion. Right lung is clear. No right pleural effusion. Skin fold artifact projecting over the right hemithorax incidentally noted. No pneumothorax. Cephalization of the pulmonary vasculature with indistinctness of the interstitial markings. Heart size is mildly enlarged. Upper mediastinal contours are within normal limits. Atherosclerotic calcifications in the thoracic aorta. Orthopedic fixation hardware in the lower cervical spine incidentally noted. IMPRESSION: 1. The appearance of the chest suggests mild congestive heart failure, as above. 2.  There is also a more focal area of volume loss and potential airspace consolidation in the left lower lobe which could reflect developing infection. 3. Small left pleural effusion. 4. Aortic atherosclerosis. Electronically Signed   By: Vinnie Langton M.D.   On: 04/14/2022 05:52   CT Head Wo Contrast  Result Date: 04/14/2022 CLINICAL DATA:  71 year old female with history of altered mental status. EXAM: CT HEAD WITHOUT CONTRAST TECHNIQUE: Contiguous axial images were obtained from the base of the skull through the vertex without intravenous contrast. RADIATION DOSE REDUCTION: This exam was performed according to the departmental dose-optimization program which includes automated exposure control, adjustment of the mA and/or kV according to patient size and/or use of iterative reconstruction technique. COMPARISON:  No priors. FINDINGS: Brain: No evidence of acute infarction, hemorrhage, hydrocephalus, extra-axial collection or mass lesion/mass effect. Vascular: No hyperdense vessel or unexpected calcification. Skull: Normal. Negative for fracture or focal lesion. Sinuses/Orbits: No acute finding. Other: None. IMPRESSION: 1. No acute intracranial abnormalities. The appearance of the brain is normal for age. Electronically Signed   By: Vinnie Langton M.D.   On: 04/14/2022 05:16      Assessment/Plan Principal Problem:   Syncope Active Problems:   Acute renal failure superimposed on stage 3a chronic kidney disease (HCC)   Atrial fibrillation, chronic (HCC)   COVID-19 virus infection   Essential hypertension   HLD (hyperlipidemia)   Hypokalemia   Kidney transplant status   Myocardial injury   Type II diabetes mellitus with renal manifestations (HCC)   Assessment and Plan: * Syncope Syncope and dizziness: Patient has chronic dizziness, with unclear etiology.  Now acute worsening dizziness and syncope are likely due to dehydration.  Patient has decreased oral intake recently possibly due to COVID  infection.  CT head is negative.  No focal neurodeficit on physical examination.  -Placed on telemetry bed for completion -Check orthostatic vital sign -IV fluid 1 L normal saline, then 75 cc/h for 6 hours -Frequent neurochecks  Acute renal failure superimposed on stage  3a chronic kidney disease ([employer]) Likely due to dehydration.  Renal ultrasound is negative for obstruction or hydronephrosis. -Hold lisinopril and Lasix -IV fluid as above -Consulted Dr. Candiss Norse for renal  Atrial fibrillation, chronic (HCC) Heart rate 79 -Continue Eliquis -Continue Cardizem, flecainide  COVID-19 virus infection Patient had positive COVID test 2 weeks ago, still positive today.  No oxygen desaturation.  No fever. -As needed albuterol and Mucinex  HLD (hyperlipidemia) - Zetia, Lipitor  Essential hypertension - IV hydralazine as needed -Hold lisinopril and Lasix due to worsening renal function -Patient is on Cardizem  Hypokalemia Potassium 3.4 -Related potassium -Check magnesium level  Kidney transplant status - Continue home Prograft and CellCept -Check tacrolimus level -Continue prednisone 5 mg daily -Give 40 mg of prednisone today as stress dose  Myocardial injury Myocardial injury due to multiple issues, including COVID infection, syncope.  Troponin level 145, 120.  No chest pain -Trend troponin -Check A1c, FLP -Continue Lipitor -Will not give aspirin since patient is on Eliquis  Type II diabetes mellitus with renal manifestations (HCC) Recent A1c 5.9, well controlled.  Patient is  taking Humalog and Levemir 8 units daily          DVT ppx: on Eliquis  Code Status: Full code  Family Communication:  Yes, patient's  husband by phone  Disposition Plan:  Anticipate discharge back to previous environment  Consults called:  Dr. Candiss Norse of renal  Admission status and Level of care: Telemetry Medical:     for obs     Dispo: The patient is from: Home              Anticipated  d/c is to: Home              Anticipated d/c date is: 1 day              Patient currently is not medically stable to d/c.    Severity of Illness:  The appropriate patient status for this patient is OBSERVATION. Observation status is judged to be reasonable and necessary in order to provide the required intensity of service to ensure the patient's safety. The patient's presenting symptoms, physical exam findings, and initial radiographic and laboratory data in the context of their medical condition is felt to place them at decreased risk for further clinical deterioration. Furthermore, it is anticipated that the patient will be medically stable for discharge from the hospital within 2 midnights of admission.        Date of Service 04/14/2022    [facility]   If 7PM-7AM, please contact night-coverage [email or web address] 04/14/2022, 7:26 PM

## 2022-04-14 NOTE — Assessment & Plan Note (Signed)
-   IV hydralazine as needed -Hold lisinopril and Lasix due to worsening renal function -Patient is on Cardizem

## 2022-04-14 NOTE — ED Notes (Signed)
CBG obtained and noted at 53mg /dl.  Pt awake and talking.  Dr Beather Arbour made aware of results.  Orange Juice given, pt consumed 6oz.  Will continue to monitor.

## 2022-04-14 NOTE — Assessment & Plan Note (Signed)
Likely due to dehydration.  Renal ultrasound is negative for obstruction or hydronephrosis. -Hold lisinopril and Lasix -IV fluid as above -Consulted Dr. Candiss Norse for renal

## 2022-04-14 NOTE — ED Notes (Signed)
Patient transported to CT 

## 2022-04-14 NOTE — Assessment & Plan Note (Signed)
Syncope and dizziness: Patient has chronic dizziness, with unclear etiology.  Now acute worsening dizziness and syncope are likely due to dehydration.  Patient has decreased oral intake recently possibly due to COVID infection.  CT head is negative.  No focal neurodeficit on physical examination.  -Placed on telemetry bed for completion -Check orthostatic vital sign -IV fluid 1 L normal saline, then 75 cc/h for 6 hours -Frequent neurochecks

## 2022-04-14 NOTE — Assessment & Plan Note (Signed)
Potassium 3.4 -Related potassium -Check magnesium level

## 2022-04-14 NOTE — Assessment & Plan Note (Signed)
Myocardial injury due to multiple issues, including COVID infection, syncope.  Troponin level 145, 120.  No chest pain -Trend troponin -Check A1c, FLP -Continue Lipitor -Will not give aspirin since patient is on Eliquis

## 2022-04-14 NOTE — Progress Notes (Signed)
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  Patient presents to the emergency department with complaints of weakness, dizziness, nausea, and vomiting.  Patient states she was recently diagnosed with COVID-19.  She has been admitted under observation for Syncope [R55]  Patient is known to our practice and receives outpatient follow-up with Dr. Lanora Manis.  She was last seen in office in August.  Patient states diabetes was the cause of her renal failure.  She received her first kidney transplant from a living donor, her sister, in January 2000.  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  She received her second renal transplant from a cadaver donor in July 2010.  These transplants took place in [place] in [place].  She states she has not had any issues or concerns since that time.  Patient states she was diagnosed with COVID-19 on 04/04/2022 and has completed all prescribed medications.  She reports her appetite has remained poor.  She reports dizziness with movement followed by nausea and vomiting.  Currently denies diarrhea.  Remains on room air and denies shortness of breath.  Does report some lightheadedness, especially with changes in position.  Denies chest pain.  Denies known fever or chills.  Chart review states patient had syncopal episode in triage.  Labs on ED arrival include potassium 3.4, serum bicarb 20, glucose 67, BUN 36, creatinine 2.52 with GFR 20.  CT head shows age-related changes.  Chest x-ray shows mild congestive heart failure and suggestive of developing infection in her left lower lobe.  We have been consulted to evaluate acute kidney injury.   Objective:  Vital signs in last 24 hours:  Temp:   [98.2 F (36.8 C)-98.4 F (36.9 C)] 98.2 F (36.8 C) (10/05 1211) Pulse Rate:  [70-79] 75 (10/05 1100) Resp:  [12-40] 28 (10/05 1100) BP: (112-133)/(54-83) 121/57 (10/05 1100) SpO2:  [93 %-100 %] 93 % (10/05 1100) Weight:  [50.8 kg] 50.8 kg (10/05 0409)  Weight change:  Filed Weights   04/14/22 0409  Weight: 50.8 kg    Intake/Output: I/O last 3 completed shifts: In: 1000.6 [IV Piggyback:1000.6] Out: -    Intake/Output this shift:  No intake/output data recorded.  Physical Exam: General: NAD, resting comfortably  Head: Normocephalic, atraumatic. Moist oral mucosal membranes  Eyes: Anicteric  Lungs:  Clear to auscultation, normal effort, room air  Heart: Regular rate and rhythm  Abdomen:  Soft, nontender, nondistended  Extremities: No peripheral edema.  Neurologic: Nonfocal, moving all four extremities  Skin: No lesions  Access: Left aVF    Basic Metabolic Panel: Recent Labs  Lab 04/14/22 0414  NA 143  K 3.4*  CL 113*  CO2 20*  GLUCOSE 67*  BUN 36*  CREATININE 2.52*  CALCIUM 8.1*  MG 2.0    Liver Function Tests: Recent Labs  Lab 04/14/22 0436  AST 21  ALT 9  ALKPHOS 55  BILITOT 0.4  PROT 5.8*  ALBUMIN 2.9*   Recent Labs  Lab 04/14/22 0436  LIPASE 36   No results for input(s): "AMMONIA" in the last 168 hours.  CBC: Recent Labs  Lab 04/14/22 0414  WBC 4.8  NEUTROABS 2.5  HGB 12.2  HCT 39.6  MCV 97.3  PLT 180    Cardiac Enzymes: No results for input(s): "CKTOTAL", "CKMB", "CKMBINDEX", "  TROPONINI" in the last 168 hours.  BNP: Invalid input(s): "POCBNP"  CBG: Recent Labs  Lab 04/14/22 0558 04/14/22 0626 04/14/22 0707 04/14/22 0821 04/14/22 1233  GLUCAP 53* 50* 163* 131* 114*    Microbiology: Results for orders placed or performed during the hospital encounter of 04/14/22  Resp Panel by RT-PCR (Flu A&B, Covid) Anterior Nasal Swab     Status: Abnormal   Collection Time: 04/14/22  4:53 AM   Specimen: Anterior Nasal Swab   Result Value Ref Range Status   SARS Coronavirus 2 by RT PCR POSITIVE (A) NEGATIVE Final    Comment: (NOTE) SARS-CoV-2 target nucleic acids are DETECTED.  The SARS-CoV-2 RNA is generally detectable in upper respiratory specimens during the acute phase of infection. Positive results are indicative of the presence of the identified virus, but do not rule out bacterial infection or co-infection with other pathogens not detected by the test. Clinical correlation with patient history and other diagnostic information is necessary to determine patient infection status. The expected result is Negative.  Fact Sheet for Patients: EntrepreneurPulse.com.au  Fact Sheet for Healthcare Providers: IncredibleEmployment.be  This test is not yet approved or cleared by the [place] FDA and  has been authorized for detection and/or diagnosis of SARS-CoV-2 by FDA under an Emergency Use Authorization (EUA).  This EUA will remain in effect (meaning this test can be used) for the duration of  the COVID-19 declaration under Section 564(b)(1) of the A ct, 21 U.S.C. section 360bbb-3(b)(1), unless the authorization is terminated or revoked sooner.     Influenza A by PCR NEGATIVE NEGATIVE Final   Influenza B by PCR NEGATIVE NEGATIVE Final    Comment: (NOTE) The Xpert Xpress SARS-CoV-2/FLU/RSV plus assay is intended as an aid in the diagnosis of influenza from Nasopharyngeal swab specimens and should not be used as a sole basis for treatment. Nasal washings and aspirates are unacceptable for Xpert Xpress SARS-CoV-2/FLU/RSV testing.  Fact Sheet for Patients: EntrepreneurPulse.com.au  Fact Sheet for Healthcare Providers: IncredibleEmployment.be  This test is not yet approved or cleared by the [place] FDA and has been authorized for detection and/or diagnosis of SARS-CoV-2 by FDA under an Emergency Use Authorization (EUA).  This EUA will remain in effect (meaning this test can be used) for the duration of the COVID-19 declaration under Section 564(b)(1) of the Act, 21 U.S.C. section 360bbb-3(b)(1), unless the authorization is terminated or revoked.  Performed at [facility], [address] [place], [place] [address]     Coagulation Studies: No results for input(s): "LABPROT", "INR" in the last 72 hours.  Urinalysis: No results for input(s): "COLORURINE", "LABSPEC", "PHURINE", "GLUCOSEU", "HGBUR", "BILIRUBINUR", "KETONESUR", "PROTEINUR", "UROBILINOGEN", "NITRITE", "LEUKOCYTESUR" in the last 72 hours.  Invalid input(s): "APPERANCEUR"    Imaging: US RENAL  Result Date: 04/14/2022 CLINICAL DATA:  Acute kidney injury, history of renal transplant. EXAM: RENAL / URINARY TRACT ULTRASOUND COMPLETE COMPARISON:  January 08, 2021. FINDINGS: Right Kidney: Renal measurements: 7.5 x 3.5 x 3.4 cm = volume: 46 mL. Increased echogenicity of renal parenchyma is noted suggesting medical renal disease. 1.9 cm simple cyst is noted in upper pole. No mass or hydronephrosis visualized. Left lower quadrant renal transplant: Renal measurements: 10.0 x 6.0 x 4.4 cm = volume: 139 mL. Mildly increased echogenicity of renal parenchyma is noted. No mass or hydronephrosis visualized. Bladder: Appears normal for degree of bladder distention. Other: None. IMPRESSION: Renal transplant is noted in left lower quadrant. No hydronephrosis or renal obstruction is noted. Increased echogenicity of of  the renal parenchyma of the left renal transplant is noted suggesting medical renal disease. Atrophy of right kidney is noted consistent with end-stage renal disease. Electronically Signed   By: Marijo Conception M.D.   On: 04/14/2022 09:10   DG Chest Port 1 View  Result Date: 04/14/2022 CLINICAL DATA:  71 year old female with history of dizziness. Recent history of COVID infection. EXAM: PORTABLE CHEST 1 VIEW COMPARISON:  No priors. FINDINGS: Lung  volumes are normal. Opacity at the left base which may reflect atelectasis and/or consolidation, with superimposed small left pleural effusion. Right lung is clear. No right pleural effusion. Skin fold artifact projecting over the right hemithorax incidentally noted. No pneumothorax. Cephalization of the pulmonary vasculature with indistinctness of the interstitial markings. Heart size is mildly enlarged. Upper mediastinal contours are within normal limits. Atherosclerotic calcifications in the thoracic aorta. Orthopedic fixation hardware in the lower cervical spine incidentally noted. IMPRESSION: 1. The appearance of the chest suggests mild congestive heart failure, as above. 2. There is also a more focal area of volume loss and potential airspace consolidation in the left lower lobe which could reflect developing infection. 3. Small left pleural effusion. 4. Aortic atherosclerosis. Electronically Signed   By: Vinnie Langton M.D.   On: 04/14/2022 05:52   CT Head Wo Contrast  Result Date: 04/14/2022 CLINICAL DATA:  71 year old female with history of altered mental status. EXAM: CT HEAD WITHOUT CONTRAST TECHNIQUE: Contiguous axial images were obtained from the base of the skull through the vertex without intravenous contrast. RADIATION DOSE REDUCTION: This exam was performed according to the departmental dose-optimization program which includes automated exposure control, adjustment of the mA and/or kV according to patient size and/or use of iterative reconstruction technique. COMPARISON:  No priors. FINDINGS: Brain: No evidence of acute infarction, hemorrhage, hydrocephalus, extra-axial collection or mass lesion/mass effect. Vascular: No hyperdense vessel or unexpected calcification. Skull: Normal. Negative for fracture or focal lesion. Sinuses/Orbits: No acute finding. Other: None. IMPRESSION: 1. No acute intracranial abnormalities. The appearance of the brain is normal for age. Electronically Signed   By:  Vinnie Langton M.D.   On: 04/14/2022 05:16     Medications:    sodium chloride     lactated ringers 75 mL/hr at 04/14/22 1002    apixaban  2.5 mg Oral BID   atorvastatin  10 mg Oral Daily   [START ON 04/15/2022] cyanocobalamin  2,000 mcg Oral Daily   diltiazem  240 mg Oral QHS   [START ON 04/15/2022] ezetimibe  10 mg Oral Daily   flecainide  50 mg Oral Q12H   insulin aspart  0-5 Units Subcutaneous QHS   insulin aspart  0-9 Units Subcutaneous TID WC   mycophenolate  500 mg Oral BID   predniSONE  40 mg Oral Once   [START ON 04/15/2022] predniSONE  5 mg Oral Q breakfast   pregabalin  25 mg Oral BID   tacrolimus  3 mg Oral BID   acetaminophen, albuterol, dextromethorphan-guaiFENesin, hydrALAZINE, meclizine, ondansetron (ZOFRAN) IV  Assessment/ Plan:  Jean Johnson is a 71 y.o.  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  Patient presents to the emergency department with complaints of weakness, dizziness, nausea, and vomiting.  Patient states she was recently diagnosed with COVID-19.  She has been admitted under observation for Syncope [R55]   Acute Kidney Injury on chronic kidney disease stage IIIb with baseline creatinine 1.64 on  02/10/2022.  Acute kidney injury secondary to acute dehydration Chronic kidney disease is secondary to diabetes.  Patient has received 2 renal transplants in the past, current transplant still functional.  No IV contrast exposure.  Renal ultrasound negative for obstruction.  Will prescribe patient lactated Ringer's at 75 mL/h and monitor renal function.  Continue immunosuppressive agents, tacrolimus and mycophenolate at current dosages.  Lab Results  Component Value Date   CREATININE 2.52 (H) 04/14/2022   CREATININE 1.13 (H) 03/27/2021   CREATININE 1.06 (H) 03/26/2021    Intake/Output Summary (Last 24  hours) at 04/14/2022 1328 Last data filed at 04/14/2022 0613 Gross per 24 hour  Intake 1000.58 ml  Output --  Net 1000.58 ml   2. Anemia of chronic kidney disease Lab Results  Component Value Date   HGB 12.2 04/14/2022    Hgb within desired target.   3. Diabetes mellitus type II with chronic kidney disease/renal manifestations: insulin dependent. Home regimen includes Levemir and Humalog. Most recent hemoglobin A1c is 5.6 on 03/25/21.   4.  Hypertension with chronic kidney disease.  Home regimen includes diltiazem, Zetia, lisinopril, and furosemide.  Furosemide currently held.  Blood pressure well controlled, 121/57.   LOS: 0   10/5/20231:28 PM

## 2022-04-14 NOTE — Assessment & Plan Note (Signed)
Heart rate 79 -Continue Eliquis -Continue Cardizem, flecainide

## 2022-04-14 NOTE — ED Triage Notes (Signed)
Pt to ED from home via EMS c/o increased dizziness and weakness since being diagnosed with COVID on 9/25.  States has follow up with neurology for the dizziness sometime next week.  Denies pain.  Nausea and vomiting several times.  EMS vitals 72 HR, 98% RA, 125/63 BP, CBG 78.  Pt A&Ox4, chest rise even and unlabored, spitting in triage, in NAD at this time.

## 2022-04-14 NOTE — ED Provider Notes (Addendum)
[facility] Provider Note    Event Date/Time   First MD Initiated Contact with Patient 04/14/22 [phone]     (approximate)   History   Dizziness   HPI  Jean Johnson is a 71 y.o. female brought to the ED via EMS from home with a chief complaint of dizziness and generalized weakness.  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  Patient was diagnosed with COVID on 04/04/2022.  States she took antivirals as prescribed.  States dizziness has been ongoing for some time.  She was seen by ENT and ophthalmology with negative work-up.  Her neurologist is referring her to [facility] for further work-up of dizziness.  Endorses sensation of feeling lightheaded as well as room spinning.  Nausea/vomiting x several times.  Denies chest pain, shortness of breath, dysuria or diarrhea.  Patient had syncopal episode while in triage and was rushed back to treatment room.  She is awake on my arrival to bedside.  Requires near total assistance to transfer from wheelchair to bed.     Past Medical History   Past Medical History:  Diagnosis Date   Anemia of chronic renal disease    Aortic stenosis, mild    a.) TTE on 11/23/2020 --> mean gradient 9.7 mmHg   Atrial fibrillation (HCC)    a.) CHA2DS2-VASc = 3 (age, sex, diabetes). b.) daily apixaban   B12 deficiency    Cervical spinal stenosis    Chronic anticoagulation    Apixaban   Diabetic neuropathy (HCC)    Diverticulosis    Dyspnea    ESRD (end stage renal disease) (HCC)    First degree AV block    HLD (hyperlipidemia)    Hypertension    Incomplete right bundle branch block (RBBB)    LAE (left atrial enlargement)    a.) TTE on 11/23/2020 --> moderate   Left thyroid nodule    a.) Cervical MRI on 12/29/2020 --> measured "at least" 3.5 cm; imcompletely imaged.   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   Valvular heart disease    a.) TTE 11/23/2020 --> mild panvalvular regurgitation     Active Problem List   Patient Active Problem List   Diagnosis Date Noted   Syncope 04/14/2022   Cervical myelopathy ([place]) 03/24/2021   Anemia 12/03/2020   Essential hypertension 12/03/2020   Hypothyroidism 12/03/2020   Type 2 diabetes mellitus ([place]) 12/03/2020   Atrial fibrillation ([place]) 09/21/2018   Kidney transplant status 09/21/2018   Osteoporosis 09/21/2018     Past Surgical History   Past Surgical History:  Procedure Laterality Date   ANTERIOR CERVICAL DECOMP/DISCECTOMY FUSION N/A 03/24/2021   Procedure: C3-6 ANTERIOR CERVICAL DECOMPRESSION/DISCECTOMY FUSION 3 LEVELS;  Surgeon: Meade Maw, MD;  Location: [facility] ORS;  Service: Neurosurgery;  Laterality: N/A;   CATARACT EXTRACTION W/PHACO Left 10/21/2020   Procedure: CATARACT EXTRACTION PHACO AND INTRAOCULAR LENS PLACEMENT ([place]) DIABETES LEFT;  Surgeon: Leandrew Koyanagi, MD;  Location: [facility];  Service: Ophthalmology;  Laterality:  Left;  2.84 0:48.8 5.8%   COLONOSCOPY WITH PROPOFOL N/A 09/03/2021   Procedure: COLONOSCOPY WITH PROPOFOL;  Surgeon: Lesly Rubenstein, MD;  Location: [facility] ENDOSCOPY;  Service: Endoscopy;  Laterality: N/A;  DM, ELIQUIS, WHEELCHAIR   EYE SURGERY Right 2020   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   Living donor organ recipient (sister)   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   Cadaveric organ recipient     Home Medications   Prior to Admission medications   Medication Sig Start Date End  Date Taking? Authorizing Provider  apixaban (ELIQUIS) 5 MG TABS tablet Take 5 mg by mouth 2 (two) times daily.    [provider]  Artificial Tear Ointment (DRY EYES OP) Place 1 drop into both eyes daily as needed (blurry Vision or floater in eyes).    [provider]  atorvastatin (LIPITOR) 10 MG tablet Take 10 mg by mouth daily.    [provider]  cyanocobalamin 2000 MCG tablet Take 2,000 mcg by mouth daily.    [provider]  diltiazem (CARDIZEM CD) 240 MG 24 hr capsule Take 240 mg by mouth at bedtime.    [provider]  ezetimibe (ZETIA) 10 MG tablet Take 10 mg by mouth daily.    [provider]  flecainide (TAMBOCOR) 100 MG tablet Take 100 mg by mouth every 12 (twelve) hours.    [provider]  insulin detemir (LEVEMIR) 100 UNIT/ML injection Inject 8 Units into the skin at bedtime.    [provider]  insulin lispro (HUMALOG) 100 UNIT/ML KwikPen Inject 0-5 Units into the skin in the morning and at bedtime. Sliding scale    [provider]  lisinopril (ZESTRIL) 30 MG tablet Take 30 mg by mouth daily.    [provider]  methocarbamol (ROBAXIN) 500 MG tablet Take 1 tablet (500 mg total) by mouth every 6 (six) hours as needed for muscle spasms. 03/27/21   Meade Maw, MD  mycophenolate (CELLCEPT) 500 MG tablet Take 500 mg by mouth 2 (two) times daily.    [provider]  Potassium Chloride ER 20 MEQ TBCR Take 20 mEq by mouth 3 (three) times daily.    [provider]  predniSONE (DELTASONE) 5 MG tablet Take 5 mg by mouth daily with breakfast.    [provider]  pregabalin (LYRICA) 25 MG capsule Take 50 mg by mouth 2 (two) times daily. 12/04/20   [provider]  senna (SENOKOT) 8.6 MG TABS tablet Take 1 tablet (8.6 mg total) by mouth 2 (two) times daily. 03/27/21   Meade Maw, MD  tacrolimus (PROGRAF) 1 MG capsule Take 3 mg by mouth 2 (two) times daily.     [provider]     Allergies  E-mycin [erythromycin] and Penicillins   Family History  History reviewed. No pertinent family history.   Physical Exam  Triage Vital Signs: ED Triage Vitals  Enc Vitals Group     BP 04/14/22 0408 120/68     Pulse Rate 04/14/22 0408 72     Resp 04/14/22 0408 16     Temp 04/14/22 0408 98.4 F (36.9 C)     Temp Source 04/14/22 0408 Oral     SpO2 04/14/22 0408 96 %     Weight 04/14/22 0409 112 lb (50.8 kg)     Height 04/14/22 0409 5' (1.524 m)     Head Circumference --      Peak Flow --      Pain Score 04/14/22 0409 0  Pain Loc --      Pain Edu? --      Excl. in [place]? --     Updated Vital Signs: BP 131/64   Pulse 79   Temp 98.4 F (36.9 C) (Oral)   Resp (!) 24   Ht 5' (1.524 m)   Wt 50.8 kg   SpO2 100%   BMI 21.87 kg/m    General: Awake, mild distress.  CV:  RRR.  Good peripheral perfusion.  Resp:  Normal effort.  CTA B. Abd:  Nontender to light or deep palpation.  Spitting into emesis bag.  No distention.  Other:  Appears dazed and slow, but performs actions appropriately..  Alert and oriented x3.  CN II-XII grossly intact.  5/5 motor strength and sensation all extremities. MAEx4.    ED Results / Procedures / Treatments  Labs (all labs ordered are listed, but only abnormal results are displayed) Labs Reviewed  RESP PANEL BY RT-PCR (FLU A&B, COVID) ARPGX2 - Abnormal; Notable for the following components:      Result Value   SARS Coronavirus 2 by RT PCR POSITIVE (*)    All other components within normal limits  BASIC METABOLIC PANEL - Abnormal; Notable for the following components:   Potassium 3.4 (*)    Chloride 113 (*)    CO2 20 (*)    Glucose, Bld 67 (*)    BUN 36 (*)    Creatinine, Ser 2.52 (*)    Calcium 8.1 (*)    GFR, Estimated 20 (*)    All other components within normal limits  HEPATIC FUNCTION PANEL - Abnormal; Notable for the following components:   Total Protein 5.8 (*)    Albumin 2.9 (*)    All  other components within normal limits  CBG MONITORING, ED - Abnormal; Notable for the following components:   Glucose-Capillary 53 (*)    All other components within normal limits  CBG MONITORING, ED - Abnormal; Notable for the following components:   Glucose-Capillary 50 (*)    All other components within normal limits  CULTURE, BLOOD (ROUTINE X 2)  CULTURE, BLOOD (ROUTINE X 2)  LIPASE, BLOOD  LACTIC ACID, PLASMA  CBC WITH DIFFERENTIAL/PLATELET  CBC  URINALYSIS, ROUTINE W REFLEX MICROSCOPIC  LACTIC ACID, PLASMA  PROCALCITONIN  TROPONIN I (HIGH SENSITIVITY)     EKG  ED ECG REPORT I, Josslin Sanjuan J, the attending physician, personally viewed and interpreted this ECG.   Date: 04/14/2022  EKG Time: 0438  Rate: 61  Rhythm: Atrial flutter  Axis: LAD  Intervals:none  ST&T Change: Nonspecific    RADIOLOGY I have independently visualized and interpreted patient's CT and chest x-ray as well as noted the radiology interpretation:  CT head: No ICH  Chest x-ray: Mild CHF, possible left lower lobe pneumonia  Official radiology report(s): DG Chest Port 1 View  Result Date: 04/14/2022 CLINICAL DATA:  71 year old female with history of dizziness. Recent history of COVID infection. EXAM: PORTABLE CHEST 1 VIEW COMPARISON:  No priors. FINDINGS: Lung volumes are normal. Opacity at the left base which may reflect atelectasis and/or consolidation, with superimposed small left pleural effusion. Right lung is clear. No right pleural effusion. Skin fold artifact projecting over the right hemithorax incidentally noted. No pneumothorax. Cephalization of the pulmonary vasculature with indistinctness of the interstitial markings. Heart size is mildly enlarged. Upper mediastinal contours are within normal limits. Atherosclerotic calcifications in the thoracic aorta. Orthopedic fixation hardware in the lower cervical spine incidentally noted. IMPRESSION: 1. The appearance of  the chest suggests mild  congestive heart failure, as above. 2. There is also a more focal area of volume loss and potential airspace consolidation in the left lower lobe which could reflect developing infection. 3. Small left pleural effusion. 4. Aortic atherosclerosis. Electronically Signed   By: Vinnie Langton M.D.   On: 04/14/2022 05:52   CT Head Wo Contrast  Result Date: 04/14/2022 CLINICAL DATA:  71 year old female with history of altered mental status. EXAM: CT HEAD WITHOUT CONTRAST TECHNIQUE: Contiguous axial images were obtained from the base of the skull through the vertex without intravenous contrast. RADIATION DOSE REDUCTION: This exam was performed according to the departmental dose-optimization program which includes automated exposure control, adjustment of the mA and/or kV according to patient size and/or use of iterative reconstruction technique. COMPARISON:  No priors. FINDINGS: Brain: No evidence of acute infarction, hemorrhage, hydrocephalus, extra-axial collection or mass lesion/mass effect. Vascular: No hyperdense vessel or unexpected calcification. Skull: Normal. Negative for fracture or focal lesion. Sinuses/Orbits: No acute finding. Other: None. IMPRESSION: 1. No acute intracranial abnormalities. The appearance of the brain is normal for age. Electronically Signed   By: Vinnie Langton M.D.   On: 04/14/2022 05:16     PROCEDURES:  Critical Care performed: Yes, see critical care procedure note(s)  CRITICAL CARE Performed by: Paulette Blanch   Total critical care time: 30 minutes  Critical care time was exclusive of separately billable procedures and treating other patients.  Critical care was necessary to treat or prevent imminent or life-threatening deterioration.  Critical care was time spent personally by me on the following activities: development of treatment plan with patient and/or surrogate as well as nursing, discussions with consultants, evaluation of patient's response to treatment,  examination of patient, obtaining history from patient or surrogate, ordering and performing treatments and interventions, ordering and review of laboratory studies, ordering and review of radiographic studies, pulse oximetry and re-evaluation of patient's condition.   Marland Kitchen1-3 Lead EKG Interpretation  Performed by: Paulette Blanch, MD Authorized by: Paulette Blanch, MD     Interpretation: normal     ECG rate:  70   ECG rate assessment: normal     Rhythm: atrial flutter     Ectopy: none     Conduction: normal   Comments:     Patient placed on cardiac monitor to evaluate for arrhythmias    MEDICATIONS ORDERED IN ED: Medications  sodium chloride 0.9 % bolus 1,000 mL (0 mLs Intravenous Stopped 04/14/22 0613)  ondansetron (ZOFRAN) injection 4 mg (4 mg Intravenous Given 04/14/22 0449)  dextrose 50 % solution 50 mL ( Intravenous Not Given 04/14/22 0636)     IMPRESSION / MDM / ASSESSMENT AND PLAN / ED COURSE  I reviewed the triage vital signs and the nursing notes.                             71 year old female presenting with dizziness, generalized weakness, nausea and vomiting status post COVID infection 2 weeks ago.  Husband notes altered mentation.  Differential diagnosis includes, but is not limited to, alcohol, illicit or prescription medications, or other toxic ingestion; intracranial pathology such as stroke or intracerebral hemorrhage; fever or infectious causes including sepsis; hypoxemia and/or hypercarbia; uremia; trauma; endocrine related disorders such as diabetes, hypoglycemia, and thyroid-related diseases; hypertensive encephalopathy; etc. I have personally reviewed patient's records and note her [facility] office visit on 04/04/2022 where she was placed on doxycycline and prednisone burst.  I have also noted her neurology office visit from 03/09/2022 for dizziness of unknown cause.  Patient's presentation is most consistent with acute presentation with potential threat to life or bodily  function.  The patient is on the cardiac monitor to evaluate for evidence of arrhythmia and/or significant heart rate changes.  We will obtain cardiac panel, CT head, chest x-ray.  Initiate IV fluid hydration, IV Zofran for nausea/vomiting.  Will reassess.  Anticipate hospitalization.  Clinical Course as of 04/14/22 0636  Thu Apr 14, 2022  0510 03/17/2022 Cr 1.32 [JS]  0541 Normal WBC 4.8; CT head negative for ICH.  Glucose 67 on serum chemistries.  Will repeat fingerstick.  Will consult hospital services for evaluation and admission. [JS]  3500 XFGH 82; will give orange juice  [JS]  0620 Chest x-ray demonstrates mild edema, possible left lower lobe infiltrate.  Will check blood cultures, lactic acid and procalcitonin.  Patient already had a course of doxycycline and she is allergic (swelling) to both penicillins and erythromycin so empiric coverage for community-acquired pneumonia with Rocephin and Azithromycin is contraindicated, as is Levaquin given her renal function. [JS]  0627 Recheck FSBS after orange juice = 50; will administer 1amp D50 [JS]    Clinical Course User Index [JS] Paulette Blanch, MD     FINAL CLINICAL IMPRESSION(S) / ED DIAGNOSES   Final diagnoses:  Dizziness  Syncope, unspecified syncope type  AKI (acute kidney injury) ([place])  Dehydration  Generalized weakness  Hypoglycemia  COVID-19     Rx / DC Orders   ED Discharge Orders     None        Note:  This document was prepared using Dragon voice recognition software and may include unintentional dictation errors.   Paulette Blanch, MD 04/14/22 9937    Paulette Blanch, MD 04/14/22 [phone]

## 2022-04-14 NOTE — Assessment & Plan Note (Signed)
-   Zetia, Lipitor

## 2022-04-14 NOTE — ED Notes (Signed)
During triage and attempting IV for bloodwork patient became unresponsive while sitting in wheelchair, increased drooling and attempting to cough.  Patient not responding to voice or sternal rub.  Patient then opening eyes after a approx 30 seconds to voice but difficult to understand.  Patient taken to room 17 and Dr. Beather Arbour at bedside.

## 2022-04-14 NOTE — Assessment & Plan Note (Signed)
Patient had positive COVID test 2 weeks ago, still positive today.  No oxygen desaturation.  No fever. -As needed albuterol and Mucinex

## 2022-04-14 NOTE — Assessment & Plan Note (Signed)
-   Continue home Prograft and CellCept -Check tacrolimus level -Continue prednisone 5 mg daily -Give 40 mg of prednisone today as stress dose

## 2022-04-15 ENCOUNTER — Encounter: Payer: Self-pay | Admitting: Internal Medicine

## 2022-04-15 DIAGNOSIS — D84821 Immunodeficiency due to drugs: Secondary | ICD-10-CM | POA: Diagnosis present

## 2022-04-15 DIAGNOSIS — I5A Non-ischemic myocardial injury (non-traumatic): Secondary | ICD-10-CM | POA: Diagnosis present

## 2022-04-15 DIAGNOSIS — I5033 Acute on chronic diastolic (congestive) heart failure: Secondary | ICD-10-CM | POA: Diagnosis not present

## 2022-04-15 DIAGNOSIS — Y830 Surgical operation with transplant of whole organ as the cause of abnormal reaction of the patient, or of later complication, without mention of misadventure at the time of the procedure: Secondary | ICD-10-CM | POA: Diagnosis present

## 2022-04-15 DIAGNOSIS — E872 Acidosis, unspecified: Secondary | ICD-10-CM | POA: Diagnosis present

## 2022-04-15 DIAGNOSIS — I130 Hypertensive heart and chronic kidney disease with heart failure and stage 1 through stage 4 chronic kidney disease, or unspecified chronic kidney disease: Secondary | ICD-10-CM | POA: Diagnosis present

## 2022-04-15 DIAGNOSIS — U071 COVID-19: Secondary | ICD-10-CM | POA: Diagnosis present

## 2022-04-15 DIAGNOSIS — R55 Syncope and collapse: Secondary | ICD-10-CM | POA: Diagnosis not present

## 2022-04-15 DIAGNOSIS — E1122 Type 2 diabetes mellitus with diabetic chronic kidney disease: Secondary | ICD-10-CM | POA: Diagnosis present

## 2022-04-15 DIAGNOSIS — Z8249 Family history of ischemic heart disease and other diseases of the circulatory system: Secondary | ICD-10-CM | POA: Diagnosis not present

## 2022-04-15 DIAGNOSIS — I4892 Unspecified atrial flutter: Secondary | ICD-10-CM | POA: Diagnosis present

## 2022-04-15 DIAGNOSIS — D631 Anemia in chronic kidney disease: Secondary | ICD-10-CM | POA: Diagnosis present

## 2022-04-15 DIAGNOSIS — E876 Hypokalemia: Secondary | ICD-10-CM | POA: Diagnosis present

## 2022-04-15 DIAGNOSIS — E785 Hyperlipidemia, unspecified: Secondary | ICD-10-CM | POA: Diagnosis present

## 2022-04-15 DIAGNOSIS — N179 Acute kidney failure, unspecified: Secondary | ICD-10-CM | POA: Diagnosis present

## 2022-04-15 DIAGNOSIS — E1140 Type 2 diabetes mellitus with diabetic neuropathy, unspecified: Secondary | ICD-10-CM | POA: Diagnosis present

## 2022-04-15 DIAGNOSIS — I482 Chronic atrial fibrillation, unspecified: Secondary | ICD-10-CM | POA: Diagnosis present

## 2022-04-15 DIAGNOSIS — E860 Dehydration: Secondary | ICD-10-CM | POA: Diagnosis present

## 2022-04-15 DIAGNOSIS — Z7901 Long term (current) use of anticoagulants: Secondary | ICD-10-CM | POA: Diagnosis not present

## 2022-04-15 DIAGNOSIS — I700 Atherosclerosis of aorta: Secondary | ICD-10-CM | POA: Diagnosis present

## 2022-04-15 DIAGNOSIS — E11649 Type 2 diabetes mellitus with hypoglycemia without coma: Secondary | ICD-10-CM | POA: Diagnosis present

## 2022-04-15 DIAGNOSIS — J208 Acute bronchitis due to other specified organisms: Secondary | ICD-10-CM | POA: Diagnosis present

## 2022-04-15 DIAGNOSIS — Z794 Long term (current) use of insulin: Secondary | ICD-10-CM | POA: Diagnosis not present

## 2022-04-15 DIAGNOSIS — T8619 Other complication of kidney transplant: Secondary | ICD-10-CM | POA: Diagnosis present

## 2022-04-15 DIAGNOSIS — N1831 Chronic kidney disease, stage 3a: Secondary | ICD-10-CM | POA: Diagnosis present

## 2022-04-15 LAB — BASIC METABOLIC PANEL
Anion gap: 4 — ABNORMAL LOW (ref 5–15)
BUN: 44 mg/dL — ABNORMAL HIGH (ref 8–23)
CO2: 21 mmol/L — ABNORMAL LOW (ref 22–32)
Calcium: 8.2 mg/dL — ABNORMAL LOW (ref 8.9–10.3)
Chloride: 117 mmol/L — ABNORMAL HIGH (ref 98–111)
Creatinine, Ser: 2.27 mg/dL — ABNORMAL HIGH (ref 0.44–1.00)
GFR, Estimated: 22 mL/min — ABNORMAL LOW (ref >60)
Glucose, Bld: 298 mg/dL — ABNORMAL HIGH (ref 70–99)
Potassium: 4.7 mmol/L (ref 3.5–5.1)
Sodium: 142 mmol/L (ref 135–145)

## 2022-04-15 LAB — LIPID PANEL
Cholesterol: 170 mg/dL (ref 0–200)
HDL: 33 mg/dL — ABNORMAL LOW (ref >40)
LDL Cholesterol: 106 mg/dL — ABNORMAL HIGH (ref 0–99)
Total CHOL/HDL Ratio: 5.2 RATIO
Triglycerides: 154 mg/dL — ABNORMAL HIGH (ref <150)
VLDL: 31 mg/dL (ref 0–40)

## 2022-04-15 LAB — GLUCOSE, CAPILLARY
Glucose-Capillary: 259 mg/dL — ABNORMAL HIGH (ref 70–99)
Glucose-Capillary: 255 mg/dL — ABNORMAL HIGH (ref 70–99)
Glucose-Capillary: 221 mg/dL — ABNORMAL HIGH (ref 70–99)
Glucose-Capillary: 160 mg/dL — ABNORMAL HIGH (ref 70–99)
Glucose-Capillary: 157 mg/dL — ABNORMAL HIGH (ref 70–99)

## 2022-04-15 MED ORDER — LACTATED RINGERS IV SOLN: INTRAVENOUS | Status: DC

## 2022-04-15 NOTE — Hospital Course (Addendum)
Jean Johnson is a 71 y.o. 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 Patient condition appears to be secondary to dehydration, with acute renal failure on chronic kidney disease stage IIIa.  Was given IV fluids.  10/7.  Renal functions better today, patient developed mild short of breath, give 40 mg oral torsemide.  10/8.  Had a fever of 100.6, chest x-ray repeated, still left lower lobe infiltrates.  Procalcitonin level less than 0.1, repeat tomorrow. Obtain speech therapy evaluation.  10/9.  Still has significant productive cough, serial procalcitonin level less than 0.1.  Started prednisone 40 mg daily for acute bronchitis secondary to COVID.

## 2022-04-15 NOTE — Evaluation (Signed)
Occupational Therapy Evaluation Patient Details Name: Jean Johnson MRN: [id number] DOB: 1951-03-20 Today's Date: 04/15/2022   History of Present Illness Jean Johnson is a 71 y.o. 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   Clinical Impression   Jean Johnson was seen for OT evaluation this date. Prior to hospital admission, pt was MOD I for household mobility using 4W/RW and community mobility using power chair. Pt lives with spouse in 2 level home, bedroom [place]. Pt presents to acute OT demonstrating impaired ADL performance and functional mobility 2/2 decreased activity tolerance and functional strength/balance deficits.   Pt currently requires increased time to exit bed, in sitting IV noted to have dislodged - RN in to address. CGA no AD use bed>BSC t/f, MIN A don underwear on BSC - assist for standing balance, posterior LOB pulling up over rear. SBA + RW for ~20 ft functional mobility. Pt reports no increased from baseline dizziness, mild SOB however unable to obtain O2 reading 2/2 artificial nails. Pt would benefit from skilled OT to address noted impairments and functional limitations (see below for any additional details). Upon hospital discharge, recommend HHOT to maximize pt safety and return to PLOF.    Recommendations for follow up therapy are one component of a multi-disciplinary discharge planning process, led by the attending physician.  Recommendations may be updated based on patient status, additional functional criteria and insurance authorization.   Follow Up Recommendations  Home health OT    Assistance Recommended at Discharge Intermittent Supervision/Assistance  Patient can return home with the following A little help with  walking and/or transfers;A little help with bathing/dressing/bathroom;Help with stairs or ramp for entrance    Functional Status Assessment  Patient has had a recent decline in their functional status and demonstrates the ability to make significant improvements in function in a reasonable and predictable amount of time.  Equipment Recommendations  None recommended by OT    Recommendations for Other Services       Precautions / Restrictions Precautions Precautions: Fall Restrictions Weight Bearing Restrictions: No      Mobility Bed Mobility Overal bed mobility: Needs Assistance Bed Mobility: Supine to Sit     Supine to sit: Supervision          Transfers Overall transfer level: Needs assistance Equipment used: None Transfers: Sit to/from Stand, Bed to chair/wheelchair/BSC Sit to Stand: Min guard     Step pivot transfers: Min guard            Balance Overall balance assessment: Needs assistance Sitting-balance support: No upper extremity supported, Feet supported Sitting balance-Leahy Scale: Good     Standing balance support: No upper extremity supported, During functional activity Standing balance-Leahy Scale: Fair                             ADL either performed or assessed with clinical judgement   ADL Overall ADL's : Needs assistance/impaired                                       General ADL Comments: MIN A don underwear on BSC - assist for standing balance, posterior LOB pulling up over rear. SBA + RW for  toilet t/f      Pertinent Vitals/Pain Pain Assessment Pain Assessment: No/denies pain     Hand Dominance     Extremity/Trunk Assessment Upper Extremity Assessment Upper Extremity Assessment: Overall WFL for tasks assessed   Lower Extremity Assessment Lower Extremity Assessment: Generalized weakness       Communication Communication Communication: No difficulties   Cognition Arousal/Alertness:  Awake/alert Behavior During Therapy: WFL for tasks assessed/performed Overall Cognitive Status: Within Functional Limits for tasks assessed                                        Home Living Family/patient expects to be discharged to:: Private residence Living Arrangements: Spouse/significant other Available Help at Discharge: Family;Available 24 hours/day Type of Home: House (townhouse) Home Access: Level entry     Home Layout: Bed/bath upstairs;1/2 bath on main level;Two level   Alternate Level Stairs-Rails: Left Bathroom Shower/Tub: Teacher, early years/pre: Standard     Home Equipment: Rollator (4 wheels);Rolling Walker (2 wheels);Wheelchair - power;Cane - single point          Prior Functioning/Environment Prior Level of Function : Needs assist             Mobility Comments: husband assists with stairs. power w/c outside, 4WW downstairs, 2WW upstairs ADLs Comments: assist for IADLs        OT Problem List: Decreased strength;Decreased activity tolerance;Impaired balance (sitting and/or standing);Decreased safety awareness      OT Treatment/Interventions: Self-care/ADL training;Therapeutic exercise;Energy conservation;DME and/or AE instruction;Therapeutic activities;Balance training;Patient/family education    OT Goals(Current goals can be found in the care plan section) Acute Rehab OT Goals Patient Stated Goal: to go home OT Goal Formulation: With patient Time For Goal Achievement: 04/29/22 Potential to Achieve Goals: Good ADL Goals Pt Will Perform Grooming: with modified independence;standing Pt Will Perform Lower Body Dressing: with modified independence;sit to/from stand Pt Will Transfer to Toilet: with modified independence;ambulating;regular height toilet  OT Frequency: Min 2X/week    Co-evaluation              AM-PAC OT "6 Clicks" Daily Activity     Outcome Measure Help from another person eating meals?: None Help  from another person taking care of personal grooming?: A Little Help from another person toileting, which includes using toliet, bedpan, or urinal?: A Little Help from another person bathing (including washing, rinsing, drying)?: A Little Help from another person to put on and taking off regular upper body clothing?: None Help from another person to put on and taking off regular lower body clothing?: A Little 6 Click Score: 20   End of Session Equipment Utilized During Treatment: Rolling walker (2 wheels)  Activity Tolerance: Patient tolerated treatment well Patient left: in chair;with call bell/phone within reach;with chair alarm set;Other (comment) (MD in room end of session)  OT Visit Diagnosis: Other abnormalities of gait and mobility (R26.89);Muscle weakness (generalized) (M62.81)                Time: 2800-3491 OT Time Calculation (min): 30 min Charges:  OT General Charges $OT Visit: 1 Visit OT Evaluation $OT Eval Moderate Complexity: 1 Mod OT Treatments $Self Care/Home Management : 8-22 mins  Dessie Coma, M.S. OTR/L  04/15/22, 10:00 AM  ascom [phone]

## 2022-04-15 NOTE — Progress Notes (Addendum)
  Progress Note   Patient: Jean Johnson SLH:[id number] DOB: 1951-03-04 DOA: 04/14/2022     0 DOS: the patient was seen and examined on 04/15/2022   Brief hospital course: Cheyne Bungert is a 71 y.o. 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 Patient condition appears to be secondary to dehydration, with acute renal failure on chronic kidney disease stage IIIa.  Was given IV fluids.  Assessment and Plan: * Syncope Secondary to dehydration.  Patient has not been eating well since first diagnosed with COVID.  She has decreased urine output. Patient was given IV fluids since admission, she no longer feels dizziness.  Acute renal failure superimposed on stage 3a chronic kidney disease ([place]) History of renal transplant. Appears due to dehydration.  Patient renal function still not at baseline, will continue IV fluids.  Recheck level tomorrow. Continue Prograf, CellCept and prednisone.  Level of Prograf is pending. Nephrology is following.  Atrial fibrillation, chronic (HCC) Heart rate under control, continue apixaban and rate control.  COVID-19 virus infection Patient had persistent positive COVID due to immunocompromise status.  No need for treatment at this time.  HLD (hyperlipidemia) - Zetia, Lipitor  Essential hypertension Hold ACE inhibitor and diuretics for acute renal failure.  Hypokalemia Improved, continue monitoring.  Myocardial injury Due to dehydration and near syncope.  Type II diabetes mellitus with renal manifestations (HCC) Continue current regimen.       Subjective:  Patient feels better today, appetite is improving.  No nausea vomiting.  No diarrhea.  Physical Exam: Vitals:   04/15/22 0017 04/15/22 0455  04/15/22 0730 04/15/22 1139  BP: (!) 140/70 122/75 (!) 143/70 121/66  Pulse: 76 63 62 (!) 56  Resp: 18 20 17 16   Temp: 98.6 F (37 C) (!) 97.5 F (36.4 C) 98 F (36.7 C) (!) 97.5 F (36.4 C)  TempSrc:      SpO2: 98% 100% 98% 96%  Weight:      Height:       General exam: Appears calm and comfortable  Respiratory system: Clear to auscultation. Respiratory effort normal. Cardiovascular system: Irregular. No JVD, murmurs, rubs, gallops or clicks. No pedal edema. Gastrointestinal system: Abdomen is nondistended, soft and nontender. No organomegaly or masses felt. Normal bowel sounds heard. Central nervous system: Alert and oriented. No focal neurological deficits. Extremities: Symmetric 5 x 5 power. Skin: No rashes, lesions or ulcers Psychiatry: Judgement and insight appear normal. Mood & affect appropriate.   Data Reviewed:  Lab results reviewed.  Family Communication:   Disposition: Status is: Inpatient Remains inpatient appropriate because: Severity of disease, IV treatment.  Planned Discharge Destination: Home    Time spent: 35 minutes  Author: Sharen Hones, MD 04/15/2022 1:55 PM  For on call review [email or web address].

## 2022-04-15 NOTE — Evaluation (Signed)
Physical Therapy Evaluation Patient Details Name: Jean Johnson MRN: [id number] DOB: 02-06-1951 Today's Date: 04/15/2022  History of Present Illness  Jean Johnson is a 71 y.o. 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  Clinical Impression  Patient seated in recliner upon  arrival to room; alert and oriented, follows commands and agreeable to participation with treatment session.  Denies pain; does endorse chronic dizziness, but at baseline levels at this time.  Unable to identify aggravating factors.  Bilat UE/LE strength and ROM grossly symmetrical and WFL; no focal weakness appreciated.  Able to complete sit/stand, basic transfers and gait (30') with RW, cga/min assist.  Demonstrates reciprocal stepping pattern, crouched posturing with sustained knee flexion (L > R) throughout gait cycle; no overt buckling or LOB. Would benefit from skilled PT to address above deficits and promote optimal return to PLOF; Recommend transition to [place] upon discharge from acute hospitalization.      Recommendations for follow up therapy are one component of a multi-disciplinary discharge planning process, led by the attending physician.  Recommendations may be updated based on patient status, additional functional criteria and insurance authorization.  Follow Up Recommendations Home health PT      Assistance Recommended at Discharge PRN  Patient can return home with the following  A little help with walking and/or transfers;A little help with bathing/dressing/bathroom    Equipment Recommendations  (has equipment needed)  Recommendations for Other Services       Functional Status Assessment Patient has had a recent decline in their functional status and  demonstrates the ability to make significant improvements in function in a reasonable and predictable amount of time.     Precautions / Restrictions Precautions Precautions: Fall Restrictions Weight Bearing Restrictions: No      Mobility  Bed Mobility               General bed mobility comments: seated in recliner beginning/end of treatment session    Transfers Overall transfer level: Needs assistance Equipment used: Rolling walker (2 wheels) Transfers: Sit to/from Stand Sit to Stand: Min guard           General transfer comment: mild use of UEs for lift off and stabilization; does pause with initial transition to upright to ensure no dizziness    Ambulation/Gait Ambulation/Gait assistance: Min guard Gait Distance (Feet): 30 Feet Assistive device: Rolling walker (2 wheels)         General Gait Details: reciprocal stepping pattern, crouched posturing with sustained knee flexion (L > R) throughout gait cycle; no overt buckling or LOB.  Stairs            Wheelchair Mobility    Modified Rankin (Stroke Patients Only)       Balance Overall balance assessment: Needs assistance Sitting-balance support: No upper extremity supported, Feet supported Sitting balance-Leahy Scale: Good     Standing balance support: Bilateral upper extremity supported Standing balance-Leahy Scale: Fair                               Pertinent Vitals/Pain Pain Assessment Pain Assessment: No/denies pain    Home Living Family/patient expects to be discharged to:: Private residence Living Arrangements: Spouse/significant other Available Help at Discharge: Family;Available 24 hours/day Type of Home: House Home Access:  Level entry     Alternate Level Stairs-Number of Steps: 14 Home Layout: Bed/bath upstairs;1/2 bath on main level;Two level Home Equipment: Rollator (4 wheels);Rolling Walker (2 wheels);Wheelchair - power;Cane - single point      Prior  Function Prior Level of Function : Needs assist             Mobility Comments: husband assists with stairs. power w/c outside, 4WW downstairs, 2WW upstairs ADLs Comments: assist for IADLs     Hand Dominance   Dominant Hand: Right    Extremity/Trunk Assessment   Upper Extremity Assessment Upper Extremity Assessment:  (moderate edema noted R mid-humerus distally; RN informed/aware (IV infiltrate?))    Lower Extremity Assessment Lower Extremity Assessment: Overall WFL for tasks assessed (grossly 4-/5 throughout)       Communication   Communication: No difficulties  Cognition Arousal/Alertness: Awake/alert Behavior During Therapy: WFL for tasks assessed/performed Overall Cognitive Status: Within Functional Limits for tasks assessed                                          General Comments      Exercises     Assessment/Plan    PT Assessment Patient needs continued PT services  PT Problem List Decreased strength;Decreased activity tolerance;Decreased balance;Decreased mobility;Decreased knowledge of use of DME;Decreased safety awareness;Decreased knowledge of precautions       PT Treatment Interventions DME instruction;Gait training;Stair training;Functional mobility training;Therapeutic activities;Therapeutic exercise;Balance training;Patient/family education    PT Goals (Current goals can be found in the Care Plan section)  Acute Rehab PT Goals Patient Stated Goal: to return home PT Goal Formulation: With patient Time For Goal Achievement: 04/29/22 Potential to Achieve Goals: Good    Frequency Min 2X/week     Co-evaluation               AM-PAC PT "6 Clicks" Mobility  Outcome Measure Help needed turning from your back to your side while in a flat bed without using bedrails?: None Help needed moving from lying on your back to sitting on the side of a flat bed without using bedrails?: None Help needed moving to and from a bed to a  chair (including a wheelchair)?: A Little Help needed standing up from a chair using your arms (e.g., wheelchair or bedside chair)?: A Little Help needed to walk in hospital room?: A Little Help needed climbing 3-5 steps with a railing? : A Little 6 Click Score: 20    End of Session Equipment Utilized During Treatment: Gait belt Activity Tolerance: Patient tolerated treatment well Patient left: in chair;with call bell/phone within reach;with chair alarm set Nurse Communication: Mobility status PT Visit Diagnosis: Difficulty in walking, not elsewhere classified (R26.2);Muscle weakness (generalized) (M62.81)    Time: 1020-1040 PT Time Calculation (min) (ACUTE ONLY): 20 min   Charges:   PT Evaluation $PT Eval Low Complexity: 1 Low         Ladamien Rammel H. Owens Shark, PT, DPT, NCS 04/15/22, 4:02 PM [phone]

## 2022-04-15 NOTE — Plan of Care (Signed)
  Problem: Education: Goal: Knowledge of risk factors and measures for prevention of condition will improve Outcome: Progressing   Problem: Coping: Goal: Psychosocial and spiritual needs will be supported Outcome: Progressing   Problem: Respiratory: Goal: Will maintain a patent airway Outcome: Progressing   Problem: Respiratory: Goal: Complications related to the disease process, condition or treatment will be avoided or minimized Outcome: Progressing

## 2022-04-15 NOTE — Inpatient Diabetes Management (Signed)
Inpatient Diabetes Program Recommendations  AACE/ADA: New Consensus Statement on Inpatient Glycemic Control   Target Ranges:  Prepandial:   less than 140 mg/dL      Peak postprandial:   less than 180 mg/dL (1-2 hours)      Critically ill patients:  140 - 180 mg/dL    Latest Reference Range & Units 04/15/22 07:26 04/15/22 08:31  Glucose-Capillary 70 - 99 mg/dL 259 (H) 255 (H)    Latest Reference Range & Units 04/14/22 05:58 04/14/22 06:26 04/14/22 07:07 04/14/22 08:21 04/14/22 12:33 04/14/22 17:17 04/14/22 22:40  Glucose-Capillary 70 - 99 mg/dL 53 (L) 50 (L) 163 (H) 131 (H) 114 (H) 125 (H) 328 (H)   Review of Glycemic Control  Diabetes history: DM2 Outpatient Diabetes medications: Levemir 8 units QHS, Humalog 0-5 units QAM and QHS Current orders for Inpatient glycemic control: Novolog 0-9 units TID with meals, Novolog 0-5 units QHS; Prednisone 5 mg QAM  Inpatient Diabetes Program Recommendations:    Insulin: Noted Novolog correction ordered today. If glucose is consistently over 180 mg/dl with Novolog correction added, please consider ordering Levemir 5 units Q24H.  Thanks, Barnie Alderman, RN, MSN, [place] [facility] Coordinator Inpatient Diabetes Program [phone] (Team Pager from 8am to [place])

## 2022-04-15 NOTE — Progress Notes (Signed)
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  Patient presents to the emergency department with complaints of weakness, dizziness, nausea, and vomiting.  Patient states she was recently diagnosed with COVID-19.  She has been admitted under observation for Dehydration [E86.0] Dizziness [R42] Syncope [R55] Hypoglycemia [E16.2] Generalized weakness [R53.1] AKI (acute kidney injury) ([place]) [N17.9] Syncope, unspecified syncope type [R55] COVID-19 [U07.1]  Patient is known to our practice and receives outpatient follow-up with Dr. Lanora Manis.  She was last seen in office in August.    Patient seen and evaluated sitting up in chair, alert and oriented Continues to complain of weakness and fatigue Voices concerns about possible discharge today, feeling too weak for her husband to care for her. Tolerating small meals, denies nausea and vomiting Remains on room air   Objective:  Vital signs in last 24 hours:  Temp:  [97.5 F (36.4 C)-98.7 F (37.1 C)] 97.5 F (36.4 C) (10/06 1139) Pulse Rate:  [56-97] 56 (10/06 1139) Resp:  [16-31] 16 (10/06 1139) BP: (107-143)/(48-79) 121/66 (10/06 1139) SpO2:  [96 %-100 %] 96 % (10/06 1139)  Weight change:  Filed Weights   04/14/22 0409  Weight: 50.8 kg    Intake/Output: I/O last 3 completed shifts: In: 1000.6 [IV Piggyback:1000.6] Out: -    Intake/Output this shift:  No intake/output data recorded.  Physical Exam: General: NAD, sitting in chair  Head: Normocephalic, atraumatic. Moist oral mucosal membranes  Eyes: Anicteric  Lungs:  Clear to auscultation, normal effort, room air  Heart: Regular rate and rhythm  Abdomen:  Soft, nontender, nondistended  Extremities: No peripheral edema.  Neurologic:  Nonfocal, moving all four extremities  Skin: No lesions  Access: Left aVF    Basic Metabolic Panel: Recent Labs  Lab 04/14/22 0414 04/15/22 0605  NA 143 142  K 3.4* 4.7  CL 113* 117*  CO2 20* 21*  GLUCOSE 67* 298*  BUN 36* 44*  CREATININE 2.52* 2.27*  CALCIUM 8.1* 8.2*  MG 2.0  --      Liver Function Tests: Recent Labs  Lab 04/14/22 0436  AST 21  ALT 9  ALKPHOS 55  BILITOT 0.4  PROT 5.8*  ALBUMIN 2.9*    Recent Labs  Lab 04/14/22 0436  LIPASE 36    No results for input(s): "AMMONIA" in the last 168 hours.  CBC: Recent Labs  Lab 04/14/22 0414  WBC 4.8  NEUTROABS 2.5  HGB 12.2  HCT 39.6  MCV 97.3  PLT 180     Cardiac Enzymes: No results for input(s): "CKTOTAL", "CKMB", "CKMBINDEX", "TROPONINI" in the last 168 hours.  BNP: Invalid input(s): "POCBNP"  CBG: Recent Labs  Lab 04/14/22 1717 04/14/22 2240 04/15/22 0726 04/15/22 0831 04/15/22 1127  GLUCAP 125* 328* 259* 255* 221*     Microbiology: Results for orders placed or performed during the hospital encounter of 04/14/22  Resp Panel by RT-PCR (Flu A&B, Covid) Anterior Nasal Swab     Status: Abnormal   Collection Time: 04/14/22  4:53 AM   Specimen: Anterior Nasal Swab  Result Value Ref Range Status   SARS Coronavirus 2 by RT PCR POSITIVE (A) NEGATIVE Final    Comment: (NOTE) SARS-CoV-2 target nucleic acids are DETECTED.  The SARS-CoV-2 RNA is generally detectable in upper respiratory specimens during the acute phase of infection. Positive results are  indicative of the presence of the identified virus, but do not rule out bacterial infection or co-infection with other pathogens not detected by the test. Clinical correlation with patient history and other diagnostic information is necessary to determine patient infection status. The expected result is Negative.  Fact Sheet for Patients: EntrepreneurPulse.com.au  Fact Sheet for Healthcare  Providers: IncredibleEmployment.be  This test is not yet approved or cleared by the [place] FDA and  has been authorized for detection and/or diagnosis of SARS-CoV-2 by FDA under an Emergency Use Authorization (EUA).  This EUA will remain in effect (meaning this test can be used) for the duration of  the COVID-19 declaration under Section 564(b)(1) of the A ct, 21 U.S.C. section 360bbb-3(b)(1), unless the authorization is terminated or revoked sooner.     Influenza A by PCR NEGATIVE NEGATIVE Final   Influenza B by PCR NEGATIVE NEGATIVE Final    Comment: (NOTE) The Xpert Xpress SARS-CoV-2/FLU/RSV plus assay is intended as an aid in the diagnosis of influenza from Nasopharyngeal swab specimens and should not be used as a sole basis for treatment. Nasal washings and aspirates are unacceptable for Xpert Xpress SARS-CoV-2/FLU/RSV testing.  Fact Sheet for Patients: EntrepreneurPulse.com.au  Fact Sheet for Healthcare Providers: IncredibleEmployment.be  This test is not yet approved or cleared by the [place] FDA and has been authorized for detection and/or diagnosis of SARS-CoV-2 by FDA under an Emergency Use Authorization (EUA). This EUA will remain in effect (meaning this test can be used) for the duration of the COVID-19 declaration under Section 564(b)(1) of the Act, 21 U.S.C. section 360bbb-3(b)(1), unless the authorization is terminated or revoked.  Performed at [facility], [address] [place], [place] [address]   Culture, blood (routine x 2)     Status: None (Preliminary result)   Collection Time: 04/14/22  8:32 AM   Specimen: BLOOD  Result Value Ref Range Status   Specimen Description BLOOD Blood Culture adequate volume  Final   Special Requests BLOOD RIGHT FOREARM  Final   Culture   Final    NO GROWTH < 24 HOURS Performed at [facility], [address] [place], [place]  [address]    Report Status PENDING  Incomplete  Culture, blood (routine x 2)     Status: None (Preliminary result)   Collection Time: 04/14/22  8:32 AM   Specimen: BLOOD  Result Value Ref Range Status   Specimen Description BLOOD Blood Culture adequate volume  Final   Special Requests BLOOD RIGHT HAND  Final   Culture   Final    NO GROWTH < 24 HOURS Performed at [facility], [address] [place], [place] [address]    Report Status PENDING  Incomplete    Coagulation Studies: No results for input(s): "LABPROT", "INR" in the last 72 hours.  Urinalysis: No results for input(s): "COLORURINE", "LABSPEC", "PHURINE", "GLUCOSEU", "HGBUR", "BILIRUBINUR", "KETONESUR", "PROTEINUR", "UROBILINOGEN", "NITRITE", "LEUKOCYTESUR" in the last 72 hours.  Invalid input(s): "APPERANCEUR"    Imaging: US RENAL  Result Date: 04/14/2022 CLINICAL DATA:  Acute kidney injury, history of renal transplant. EXAM: RENAL / URINARY TRACT ULTRASOUND COMPLETE COMPARISON:  January 08, 2021. FINDINGS: Right Kidney: Renal measurements: 7.5 x 3.5 x 3.4 cm = volume: 46 mL. Increased echogenicity of renal parenchyma is noted suggesting medical renal disease. 1.9 cm simple cyst is noted in upper pole. No mass or hydronephrosis visualized. Left lower quadrant renal transplant: Renal measurements: 10.0 x 6.0 x 4.4 cm = volume: 139 mL. Mildly  increased echogenicity of renal parenchyma is noted. No mass or hydronephrosis visualized. Bladder: Appears normal for degree of bladder distention. Other: None. IMPRESSION: Renal transplant is noted in left lower quadrant. No hydronephrosis or renal obstruction is noted. Increased echogenicity of of the renal parenchyma of the left renal transplant is noted suggesting medical renal disease. Atrophy of right kidney is noted consistent with end-stage renal disease. Electronically Signed   By: Marijo Conception M.D.   On: 04/14/2022 09:10   DG Chest Port 1 View  Result Date:  04/14/2022 CLINICAL DATA:  71 year old female with history of dizziness. Recent history of COVID infection. EXAM: PORTABLE CHEST 1 VIEW COMPARISON:  No priors. FINDINGS: Lung volumes are normal. Opacity at the left base which may reflect atelectasis and/or consolidation, with superimposed small left pleural effusion. Right lung is clear. No right pleural effusion. Skin fold artifact projecting over the right hemithorax incidentally noted. No pneumothorax. Cephalization of the pulmonary vasculature with indistinctness of the interstitial markings. Heart size is mildly enlarged. Upper mediastinal contours are within normal limits. Atherosclerotic calcifications in the thoracic aorta. Orthopedic fixation hardware in the lower cervical spine incidentally noted. IMPRESSION: 1. The appearance of the chest suggests mild congestive heart failure, as above. 2. There is also a more focal area of volume loss and potential airspace consolidation in the left lower lobe which could reflect developing infection. 3. Small left pleural effusion. 4. Aortic atherosclerosis. Electronically Signed   By: Vinnie Langton M.D.   On: 04/14/2022 05:52   CT Head Wo Contrast  Result Date: 04/14/2022 CLINICAL DATA:  71 year old female with history of altered mental status. EXAM: CT HEAD WITHOUT CONTRAST TECHNIQUE: Contiguous axial images were obtained from the base of the skull through the vertex without intravenous contrast. RADIATION DOSE REDUCTION: This exam was performed according to the departmental dose-optimization program which includes automated exposure control, adjustment of the mA and/or kV according to patient size and/or use of iterative reconstruction technique. COMPARISON:  No priors. FINDINGS: Brain: No evidence of acute infarction, hemorrhage, hydrocephalus, extra-axial collection or mass lesion/mass effect. Vascular: No hyperdense vessel or unexpected calcification. Skull: Normal. Negative for fracture or focal lesion.  Sinuses/Orbits: No acute finding. Other: None. IMPRESSION: 1. No acute intracranial abnormalities. The appearance of the brain is normal for age. Electronically Signed   By: Vinnie Langton M.D.   On: 04/14/2022 05:16     Medications:      apixaban  2.5 mg Oral BID   atorvastatin  10 mg Oral Daily   cyanocobalamin  2,000 mcg Oral Daily   diltiazem  240 mg Oral QHS   ezetimibe  10 mg Oral Daily   flecainide  50 mg Oral Q12H   insulin aspart  0-5 Units Subcutaneous QHS   insulin aspart  0-9 Units Subcutaneous TID WC   mycophenolate  500 mg Oral BID   predniSONE  5 mg Oral Q breakfast   pregabalin  25 mg Oral BID   tacrolimus  3 mg Oral BID   acetaminophen, albuterol, dextromethorphan-guaiFENesin, hydrALAZINE, meclizine, ondansetron (ZOFRAN) IV  Assessment/ Plan:  Ms. Shirleymae Hauth is a 71 y.o.  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  Patient presents to the emergency department with complaints of weakness, dizziness, nausea, and vomiting.  Patient states she was recently diagnosed with COVID-19.  She has been admitted under observation for Dehydration [E86.0] Dizziness [R42] Syncope [R55] Hypoglycemia [  E16.2] Generalized weakness [R53.1] AKI (acute kidney injury) ([place]) [N17.9] Syncope, unspecified syncope type [R55] COVID-19 [U07.1]   Acute Kidney Injury on chronic kidney disease stage IIIb with baseline creatinine 1.34 on 02/14/2022.  Acute kidney injury secondary to acute dehydration Chronic kidney disease is secondary to diabetes.  Patient has received 2 renal transplants in the past, current transplant still functional.  No IV contrast exposure.  Renal ultrasound negative for obstruction.    Slight improvement in creatinine with IV fluids.  No urine output recorded.  We will continue to monitor patient.  Patient will need  follow-up in our office at discharge.  Lab Results  Component Value Date   CREATININE 2.27 (H) 04/15/2022   CREATININE 2.52 (H) 04/14/2022   CREATININE 1.13 (H) 03/27/2021   No intake or output data in the 24 hours ending 04/15/22 1206  2. Anemia of chronic kidney disease Lab Results  Component Value Date   HGB 12.2 04/14/2022  Hemoglobin remains at goal.  3. Diabetes mellitus type II with chronic kidney disease/renal manifestations: insulin dependent. Home regimen includes Levemir and Humalog. Most recent hemoglobin A1c is 5.6 on 03/25/21.   Glucose elevated, sliding scale insulin managed by primary team.  4.  Hypertension with chronic kidney disease.  Home regimen includes diltiazem, Zetia, lisinopril, and furosemide.  Furosemide currently held.  Blood pressure remains acceptable for this patient.   LOS: 0   10/6/202312:06 PM

## 2022-04-16 DIAGNOSIS — N179 Acute kidney failure, unspecified: Secondary | ICD-10-CM | POA: Diagnosis not present

## 2022-04-16 DIAGNOSIS — R55 Syncope and collapse: Secondary | ICD-10-CM | POA: Diagnosis not present

## 2022-04-16 DIAGNOSIS — U071 COVID-19: Secondary | ICD-10-CM | POA: Diagnosis not present

## 2022-04-16 DIAGNOSIS — I482 Chronic atrial fibrillation, unspecified: Secondary | ICD-10-CM | POA: Diagnosis not present

## 2022-04-16 LAB — BASIC METABOLIC PANEL
Anion gap: 3 — ABNORMAL LOW (ref 5–15)
BUN: 43 mg/dL — ABNORMAL HIGH (ref 8–23)
CO2: 22 mmol/L (ref 22–32)
Calcium: 8.8 mg/dL — ABNORMAL LOW (ref 8.9–10.3)
Chloride: 118 mmol/L — ABNORMAL HIGH (ref 98–111)
Creatinine, Ser: 1.87 mg/dL — ABNORMAL HIGH (ref 0.44–1.00)
GFR, Estimated: 28 mL/min — ABNORMAL LOW (ref >60)
Glucose, Bld: 111 mg/dL — ABNORMAL HIGH (ref 70–99)
Potassium: 4.1 mmol/L (ref 3.5–5.1)
Sodium: 143 mmol/L (ref 135–145)

## 2022-04-16 LAB — GLUCOSE, CAPILLARY
Glucose-Capillary: 119 mg/dL — ABNORMAL HIGH (ref 70–99)
Glucose-Capillary: 88 mg/dL (ref 70–99)
Glucose-Capillary: 117 mg/dL — ABNORMAL HIGH (ref 70–99)
Glucose-Capillary: 109 mg/dL — ABNORMAL HIGH (ref 70–99)

## 2022-04-16 LAB — CBC
HCT: 34.4 % — ABNORMAL LOW (ref 36.0–46.0)
Hemoglobin: 10.8 g/dL — ABNORMAL LOW (ref 12.0–15.0)
MCH: 30.4 pg (ref 26.0–34.0)
MCHC: 31.4 g/dL (ref 30.0–36.0)
MCV: 96.9 fL (ref 80.0–100.0)
Platelets: 177 10*3/uL (ref 150–400)
RBC: 3.55 MIL/uL — ABNORMAL LOW (ref 3.87–5.11)
RDW: 14.6 % (ref 11.5–15.5)
WBC: 7.4 10*3/uL (ref 4.0–10.5)
nRBC: 0 % (ref 0.0–0.2)

## 2022-04-16 LAB — TACROLIMUS LEVEL: Tacrolimus (FK506) - LabCorp: 5.8 ng/mL (ref 2.0–20.0)

## 2022-04-16 LAB — BRAIN NATRIURETIC PEPTIDE: B Natriuretic Peptide: 549.3 pg/mL — ABNORMAL HIGH (ref 0.0–100.0)

## 2022-04-16 LAB — MAGNESIUM: Magnesium: 2 mg/dL (ref 1.7–2.4)

## 2022-04-16 MED ORDER — TORSEMIDE 20 MG PO TABS
40.0000 mg | ORAL_TABLET | Freq: Once | ORAL | Status: AC
  Administered 2022-04-16: 40 mg via ORAL
  Filled 2022-04-16: qty 2

## 2022-04-16 MED ORDER — GUAIFENESIN ER 600 MG PO TB12
1200.0000 mg | ORAL_TABLET | Freq: Two times a day (BID) | ORAL | Status: DC
  Administered 2022-04-16 – 2022-04-19 (×7): 1200 mg via ORAL
  Filled 2022-04-16 (×7): qty 2

## 2022-04-16 MED ORDER — IPRATROPIUM-ALBUTEROL 20-100 MCG/ACT IN AERS
1.0000 | INHALATION_SPRAY | Freq: Four times a day (QID) | RESPIRATORY_TRACT | Status: DC
  Administered 2022-04-16 – 2022-04-19 (×12): 1 via RESPIRATORY_TRACT
  Filled 2022-04-16: qty 4

## 2022-04-16 NOTE — Progress Notes (Signed)
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  Patient presents to the emergency department with complaints of weakness, dizziness, nausea, and vomiting.  Patient states she was recently diagnosed with COVID-19.  She has been admitted under observation for Dehydration [E86.0] Dizziness [R42] Syncope [R55] Hypoglycemia [E16.2] Generalized weakness [R53.1] AKI (acute kidney injury) ([place]) [N17.9] Syncope, unspecified syncope type [R55] COVID-19 [U07.1] Acute renal failure superimposed on stage 3a chronic kidney disease ([place]) [N17.9, N18.31]  Patient is known to our practice and receives outpatient follow-up with Dr. Lanora Manis.  She was last seen in office in August.    Patient seen resting quietly, ill appearing Continues to complain of fatigue Poor appetite persist Remains on room air  Objective:  Vital signs in last 24 hours:  Temp:  [97.5 F (36.4 C)-98.7 F (37.1 C)] 98.2 F (36.8 C) (10/07 0745) Pulse Rate:  [56-80] 76 (10/07 0745) Resp:  [16-18] 18 (10/07 0745) BP: (119-153)/(56-87) 153/68 (10/07 0745) SpO2:  [96 %-97 %] 97 % (10/07 0745)  Weight change:  Filed Weights   04/14/22 0409  Weight: 50.8 kg    Intake/Output: I/O last 3 completed shifts: In: 1024.4 [I.V.:1024.4] Out: -    Intake/Output this shift:  No intake/output data recorded.  Physical Exam: General: NAD, ill appearing  Head: Normocephalic, atraumatic. Moist oral mucosal membranes  Eyes: Anicteric  Lungs:  Clear to auscultation, normal effort, room air  Heart: Regular rate and rhythm  Abdomen:  Soft, nontender, nondistended  Extremities: No peripheral edema.  Neurologic: Nonfocal, moving all four extremities  Skin: No lesions  Access: Left aVF    Basic  Metabolic Panel: Recent Labs  Lab 04/14/22 0414 04/15/22 0605 04/16/22 0555  NA 143 142 143  K 3.4* 4.7 4.1  CL 113* 117* 118*  CO2 20* 21* 22  GLUCOSE 67* 298* 111*  BUN 36* 44* 43*  CREATININE 2.52* 2.27* 1.87*  CALCIUM 8.1* 8.2* 8.8*  MG 2.0  --  2.0     Liver Function Tests: Recent Labs  Lab 04/14/22 0436  AST 21  ALT 9  ALKPHOS 55  BILITOT 0.4  PROT 5.8*  ALBUMIN 2.9*    Recent Labs  Lab 04/14/22 0436  LIPASE 36    No results for input(s): "AMMONIA" in the last 168 hours.  CBC: Recent Labs  Lab 04/14/22 0414 04/16/22 0555  WBC 4.8 7.4  NEUTROABS 2.5  --   HGB 12.2 10.8*  HCT 39.6 34.4*  MCV 97.3 96.9  PLT 180 177     Cardiac Enzymes: No results for input(s): "CKTOTAL", "CKMB", "CKMBINDEX", "TROPONINI" in the last 168 hours.  BNP: Invalid input(s): "POCBNP"  CBG: Recent Labs  Lab 04/15/22 0831 04/15/22 1127 04/15/22 1537 04/15/22 1954 04/16/22 0747  GLUCAP 255* 221* 160* 157* 70*     Microbiology: Results for orders placed or performed during the hospital encounter of 04/14/22  Resp Panel by RT-PCR (Flu A&B, Covid) Anterior Nasal Swab     Status: Abnormal   Collection Time: 04/14/22  4:53 AM   Specimen: Anterior Nasal Swab  Result Value Ref Range Status   SARS Coronavirus 2 by RT PCR POSITIVE (A) NEGATIVE Final    Comment: (NOTE) SARS-CoV-2 target nucleic acids are DETECTED.  The SARS-CoV-2 RNA is generally detectable in upper respiratory specimens during the acute phase of  infection. Positive results are indicative of the presence of the identified virus, but do not rule out bacterial infection or co-infection with other pathogens not detected by the test. Clinical correlation with patient history and other diagnostic information is necessary to determine patient infection status. The expected result is Negative.  Fact Sheet for Patients: EntrepreneurPulse.com.au  Fact Sheet for Healthcare  Providers: IncredibleEmployment.be  This test is not yet approved or cleared by the [place] FDA and  has been authorized for detection and/or diagnosis of SARS-CoV-2 by FDA under an Emergency Use Authorization (EUA).  This EUA will remain in effect (meaning this test can be used) for the duration of  the COVID-19 declaration under Section 564(b)(1) of the A ct, 21 U.S.C. section 360bbb-3(b)(1), unless the authorization is terminated or revoked sooner.     Influenza A by PCR NEGATIVE NEGATIVE Final   Influenza B by PCR NEGATIVE NEGATIVE Final    Comment: (NOTE) The Xpert Xpress SARS-CoV-2/FLU/RSV plus assay is intended as an aid in the diagnosis of influenza from Nasopharyngeal swab specimens and should not be used as a sole basis for treatment. Nasal washings and aspirates are unacceptable for Xpert Xpress SARS-CoV-2/FLU/RSV testing.  Fact Sheet for Patients: EntrepreneurPulse.com.au  Fact Sheet for Healthcare Providers: IncredibleEmployment.be  This test is not yet approved or cleared by the [place] FDA and has been authorized for detection and/or diagnosis of SARS-CoV-2 by FDA under an Emergency Use Authorization (EUA). This EUA will remain in effect (meaning this test can be used) for the duration of the COVID-19 declaration under Section 564(b)(1) of the Act, 21 U.S.C. section 360bbb-3(b)(1), unless the authorization is terminated or revoked.  Performed at [facility], [address] [place], [place] [address]   Culture, blood (routine x 2)     Status: None (Preliminary result)   Collection Time: 04/14/22  8:32 AM   Specimen: BLOOD  Result Value Ref Range Status   Specimen Description BLOOD Blood Culture adequate volume  Final   Special Requests BLOOD RIGHT FOREARM  Final   Culture   Final    NO GROWTH 2 DAYS Performed at [facility], [address], [place], [place] [address]     Report Status PENDING  Incomplete  Culture, blood (routine x 2)     Status: None (Preliminary result)   Collection Time: 04/14/22  8:32 AM   Specimen: BLOOD  Result Value Ref Range Status   Specimen Description BLOOD Blood Culture adequate volume  Final   Special Requests BLOOD RIGHT HAND  Final   Culture   Final    NO GROWTH 2 DAYS Performed at [facility], [address] [place], [place] [address]    Report Status PENDING  Incomplete    Coagulation Studies: No results for input(s): "LABPROT", "INR" in the last 72 hours.  Urinalysis: No results for input(s): "COLORURINE", "LABSPEC", "PHURINE", "GLUCOSEU", "HGBUR", "BILIRUBINUR", "KETONESUR", "PROTEINUR", "UROBILINOGEN", "NITRITE", "LEUKOCYTESUR" in the last 72 hours.  Invalid input(s): "APPERANCEUR"    Imaging: No results found.   Medications:      apixaban  2.5 mg Oral BID   atorvastatin  10 mg Oral Daily   cyanocobalamin  2,000 mcg Oral Daily   diltiazem  240 mg Oral QHS   ezetimibe  10 mg Oral Daily   flecainide  50 mg Oral Q12H   guaiFENesin  1,200 mg Oral BID   insulin aspart  0-5 Units Subcutaneous QHS   insulin aspart  0-9 Units Subcutaneous TID WC  Ipratropium-Albuterol  1 puff Inhalation Q6H   mycophenolate  500 mg Oral BID   predniSONE  5 mg Oral Q breakfast   pregabalin  25 mg Oral BID   tacrolimus  3 mg Oral BID   acetaminophen, albuterol, dextromethorphan-guaiFENesin, hydrALAZINE, meclizine, ondansetron (ZOFRAN) IV  Assessment/ Plan:  [name] is a 70 y.o.  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  Patient presents to the emergency department with complaints of weakness, dizziness, nausea, and vomiting.  Patient states she was recently diagnosed with COVID-19.  She has been admitted under observation for Dehydration  [E86.0] Dizziness [R42] Syncope [R55] Hypoglycemia [E16.2] Generalized weakness [R53.1] AKI (acute kidney injury) ([place]) [N17.9] Syncope, unspecified syncope type [R55] COVID-19 [U07.1] Acute renal failure superimposed on stage 3a chronic kidney disease ([place]) [N17.9, N18.31]   Acute Kidney Injury on chronic kidney disease stage IIIb with baseline creatinine 1.34 on 02/14/2022.  Acute kidney injury secondary to acute dehydration Chronic kidney disease is secondary to diabetes.  Patient has received 2 renal transplants in the past, current transplant still functional.  No IV contrast exposure.  Renal ultrasound negative for obstruction.    Creatinine continues to improve. Encouraged to increase oral intake. Will continue to monitor daily.   Lab Results  Component Value Date   CREATININE 1.87 (H) 04/16/2022   CREATININE 2.27 (H) 04/15/2022   CREATININE 2.52 (H) 04/14/2022    Intake/Output Summary (Last 24 hours) at 04/16/2022 1037 Last data filed at 04/16/2022 0551 Gross per 24 hour  Intake 1024.38 ml  Output --  Net 1024.38 ml    2. Anemia of chronic kidney disease Lab Results  Component Value Date   HGB 10.8 (L) 04/16/2022  Hemoglobin stable  3. Diabetes mellitus type II with chronic kidney disease/renal manifestations: insulin dependent. Home regimen includes Levemir and Humalog. Most recent hemoglobin A1c is 5.6 on 03/25/21.   Glucose improved, sliding scale insulin managed by primary team.  4.  Hypertension with chronic kidney disease.  Home regimen includes diltiazem, Zetia, lisinopril, and furosemide.  Furosemide currently held.  Blood pressure stable   LOS: 1   10/7/202310:37 AM

## 2022-04-16 NOTE — Progress Notes (Signed)
  Progress Note   Patient: Jean Johnson ZOX:[id number] DOB: 06-05-1951 DOA: 04/14/2022     1 DOS: the patient was seen and examined on 04/16/2022   Brief hospital course: Aviana Shevlin is a 71 y.o. 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 Patient condition appears to be secondary to dehydration, with acute renal failure on chronic kidney disease stage IIIa.  Was given IV fluids.  10/7.  Renal functions better today, patient developed mild short of breath, give 40 mg oral torsemide.  Assessment and Plan:  * Syncope Secondary to dehydration.  No recurrence.   Acute renal failure superimposed on stage 3a chronic kidney disease ([place]) History of renal transplant. Renal function continue to improve, patient developed mild shortness of breath.  Discontinue fluids. Patient appears to have some mild volume overload, give her dose of torsemide.  Check renal function tomorrow.   Atrial fibrillation, chronic ([place]) Continue current treatment.   COVID-19 virus infection Patient had persistent positive COVID due to immunocompromise status.  No need for treatment at this time.   HLD (hyperlipidemia) Zetia, Lipitor   Essential hypertension Continue current treatment.   Hypokalemia Condition improved.   Myocardial injury Due to dehydration and near syncope.   Type II diabetes mellitus with renal manifestations (HCC) Continue current regimen.     Subjective:  Patient had increased cough today, nonproductive.  Also complaining of chest congestion. She also had some mild short of breath through last night.  Physical Exam: Vitals:   04/15/22 1955 04/15/22 2354 04/16/22 0638 04/16/22 0745  BP: 130/87 (!) 119/56 (!) 142/63 (!) 153/68   Pulse: 60 60 68 76  Resp: 16 16 16 18   Temp: 98.5 F (36.9 C) 98.7 F (37.1 C) 97.6 F (36.4 C) 98.2 F (36.8 C)  TempSrc:    Oral  SpO2: 97% 97% 97% 97%  Weight:      Height:       General exam: Appears calm and comfortable  Respiratory system: Crackles in the left base. Respiratory effort normal. Cardiovascular system: Irregular. No JVD, murmurs, rubs, gallops or clicks. No pedal edema. Gastrointestinal system: Abdomen is nondistended, soft and nontender. No organomegaly or masses felt. Normal bowel sounds heard. Central nervous system: Alert and oriented. No focal neurological deficits. Extremities: Symmetric 5 x 5 power. Skin: No rashes, lesions or ulcers Psychiatry: Mood & affect appropriate.   Data Reviewed:  Lab results reviewed  Family Communication: husband updated  Disposition: Status is: Inpatient Remains inpatient appropriate because: severity of disease.   Planned Discharge Destination: Home with Home Health    Time spent: 35 minutes  Author: Sharen Hones, MD 04/16/2022 11:34 AM  For on call review [email or web address].

## 2022-04-17 ENCOUNTER — Inpatient Hospital Stay: Payer: Medicare Other

## 2022-04-17 DIAGNOSIS — U071 COVID-19: Secondary | ICD-10-CM | POA: Diagnosis not present

## 2022-04-17 DIAGNOSIS — R55 Syncope and collapse: Secondary | ICD-10-CM | POA: Diagnosis not present

## 2022-04-17 DIAGNOSIS — N1831 Chronic kidney disease, stage 3a: Secondary | ICD-10-CM | POA: Diagnosis not present

## 2022-04-17 DIAGNOSIS — N179 Acute kidney failure, unspecified: Secondary | ICD-10-CM | POA: Diagnosis not present

## 2022-04-17 LAB — URINALYSIS, COMPLETE (UACMP) WITH MICROSCOPIC
Bilirubin Urine: NEGATIVE
Glucose, UA: NEGATIVE mg/dL
Ketones, ur: NEGATIVE mg/dL
Leukocytes,Ua: NEGATIVE
Nitrite: NEGATIVE
Protein, ur: 100 mg/dL — AB
Specific Gravity, Urine: 1.012 (ref 1.005–1.030)
pH: 5 (ref 5.0–8.0)

## 2022-04-17 LAB — PROCALCITONIN: Procalcitonin: <0.1 ng/mL

## 2022-04-17 LAB — CBC
HCT: 36.3 % (ref 36.0–46.0)
Hemoglobin: 11.8 g/dL — ABNORMAL LOW (ref 12.0–15.0)
MCH: 30.8 pg (ref 26.0–34.0)
MCHC: 32.5 g/dL (ref 30.0–36.0)
MCV: 94.8 fL (ref 80.0–100.0)
Platelets: 164 10*3/uL (ref 150–400)
RBC: 3.83 MIL/uL — ABNORMAL LOW (ref 3.87–5.11)
RDW: 14.4 % (ref 11.5–15.5)
WBC: 7.7 10*3/uL (ref 4.0–10.5)
nRBC: 0 % (ref 0.0–0.2)

## 2022-04-17 LAB — BASIC METABOLIC PANEL
Anion gap: 10 (ref 5–15)
BUN: 39 mg/dL — ABNORMAL HIGH (ref 8–23)
CO2: 24 mmol/L (ref 22–32)
Calcium: 9.7 mg/dL (ref 8.9–10.3)
Chloride: 107 mmol/L (ref 98–111)
Creatinine, Ser: 1.94 mg/dL — ABNORMAL HIGH (ref 0.44–1.00)
GFR, Estimated: 27 mL/min — ABNORMAL LOW (ref >60)
Glucose, Bld: 96 mg/dL (ref 70–99)
Potassium: 3.9 mmol/L (ref 3.5–5.1)
Sodium: 141 mmol/L (ref 135–145)

## 2022-04-17 LAB — GLUCOSE, CAPILLARY
Glucose-Capillary: 109 mg/dL — ABNORMAL HIGH (ref 70–99)
Glucose-Capillary: 116 mg/dL — ABNORMAL HIGH (ref 70–99)
Glucose-Capillary: 153 mg/dL — ABNORMAL HIGH (ref 70–99)
Glucose-Capillary: 83 mg/dL (ref 70–99)
Glucose-Capillary: 98 mg/dL (ref 70–99)

## 2022-04-17 MED ORDER — LACTATED RINGERS IV SOLN: INTRAVENOUS | Status: DC

## 2022-04-17 NOTE — Evaluation (Addendum)
Clinical/Bedside Swallow Evaluation Patient Details  Name: Jean Johnson MRN: [id number] Date of Birth: Nov 29, 1950  Today's Date: 04/17/2022 Time: SLP Start Time (ACUTE ONLY): 71 SLP Stop Time (ACUTE ONLY): 1625 SLP Time Calculation (min) (ACUTE ONLY): 35 min  Past Medical History:  Past Medical History:  Diagnosis Date   Anemia of chronic renal disease    Aortic stenosis, mild    a.) TTE on 11/23/2020 --> mean gradient 9.7 mmHg   Atrial fibrillation ([place])    a.) CHA2DS2-VASc = 3 (age, sex, diabetes). b.) daily apixaban   B12 deficiency    Cervical spinal stenosis    Chronic anticoagulation    Apixaban   Diabetic neuropathy (HCC)    Diverticulosis    Dyspnea    ESRD (end stage renal disease) (HCC)    First degree AV block    HLD (hyperlipidemia)    Hypertension    Incomplete right bundle branch block (RBBB)    LAE (left atrial enlargement)    a.) TTE on 11/23/2020 --> moderate   Left thyroid nodule    a.) Cervical MRI on 12/29/2020 --> measured "at least" 3.5 cm; imcompletely imaged.   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   Valvular heart disease    a.) TTE 11/23/2020 --> mild panvalvular regurgitation   Past Surgical History:  Past Surgical History:  Procedure Laterality Date   ANTERIOR CERVICAL DECOMP/DISCECTOMY FUSION N/A 03/24/2021   Procedure: C3-6 ANTERIOR CERVICAL DECOMPRESSION/DISCECTOMY FUSION 3 LEVELS;  Surgeon: Meade Maw, MD;  Location: [facility] ORS;  Service: Neurosurgery;  Laterality: N/A;   CATARACT EXTRACTION W/PHACO Left  10/21/2020   Procedure: CATARACT EXTRACTION PHACO AND INTRAOCULAR LENS PLACEMENT ([place]) DIABETES LEFT;  Surgeon: Leandrew Koyanagi, MD;  Location: [place];  Service: Ophthalmology;  Laterality: Left;  2.84 0:48.8 5.8%   COLONOSCOPY WITH PROPOFOL N/A 09/03/2021   Procedure: COLONOSCOPY WITH PROPOFOL;  Surgeon: Lesly Rubenstein, MD;  Location: [facility] ENDOSCOPY;  Service: Endoscopy;  Laterality: N/A;  DM, ELIQUIS, WHEELCHAIR   EYE SURGERY Right 2020   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   Living donor organ recipient (sister)   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   Cadaveric organ recipient   HPI:  Per H&P: "Jean Johnson is a 71 y.o. 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 Pt was diagnosed with COVID on 04/04/2022. Pt was treated with a course of doxycycline and prednisone by PCP. She states that she has chronic dizziness. She was seen by ENT and ophthalmology with negative work-up. She had MRI of brain on  08/06/21 which was negative for stroke. Her neurologist is referring her to [facility] for further work-up of dizziness. She continues to have dizziness and lightheadedness. She endorses sensation of feeling lightheaded as well as room spinning.  No unilateral numbness in extremities.  No facial droop or slurred speech.  Patient has mild dry cough, denies chest pain, shortness breath, fever or chills.  Patient had nausea and vomited twice yesterday which has resolved.  Currently no nausea vomiting, diarrhea or abdominal pain.  Denies symptoms of UTI.  Per EDP, patient had syncopal episode while in triage and was rushed back to treatment room."  CXR 04/17/22: "Increasing opacity and volume loss within the mid and lower LEFT lung with possible  superimposed LEFT pleural effusion. This likely represents a combination of increasing airspace disease and atelectasis." CT Head 04/14/22: "No acute intracranial abnormalities. The appearance of the brain is normal for age." Pt is currently on room air and regular solids and thin liquids diet. RN reporting pt with congest cough and limited appetite, but no acute issue with intake.    Assessment / Plan / Recommendation  Clinical Impression  Pt seen for clinical swallow assessment. Oral care and repositioning provided prior to trials. Pt with strong congested cough immediately upon sitting up, cough persisted t/o assessment, though never timed with swallow. Oral motor function grossly functional.      Pt presents with suspected functional oropharyngeal swallow in the setting of PNA (suspect related to COVID per MD note). Pt seen with trials of thin liquid (vis cup and straw), puree, and regular solids. No overt or subtle s/sx pharyngeal dysphagia noted during trials. No change to vocal quality across trials. Oral phase was adequate for mastication and clearance of regular solid trials. Pt denied hx of dysphagia or PNA. Pt endorsed challenge with "completion of swallow" and need to expectorate material from oral cavity- which was not observed today. Further probing warranted to determine nature of complaint.       Recommend continued regular solids and thin liquids with aspiration precautions (slow rate, small bites, elevated HOB, and alert for PO intake). Pause oral intake in the setting of cough initiation. Verbal education provided for recommendations. SLP to follow up to ensure compliance with precautions and safety with intake. SLP Visit Diagnosis: Dysphagia, oropharyngeal phase (R13.12)    Aspiration Risk  Mild aspiration risk    Diet Recommendation   Continue with regular diet and thin liquids   Medication Administration: Whole meds with liquid    Other  Recommendations Oral Care  Recommendations: Oral care BID    Recommendations for follow up therapy are one component of a multi-disciplinary discharge planning process, led by the attending physician.  Recommendations may be updated based on patient status, additional functional criteria and insurance authorization.  Follow up Recommendations  (TBD)      Assistance Recommended at Discharge Set up Supervision/Assistance  Functional Status Assessment Patient has had a recent decline in their functional status and demonstrates the ability to make significant improvements in function in a reasonable and predictable amount of time.  Frequency and Duration min 1 x/week  1 week       Prognosis Prognosis for Safe Diet Advancement: Good Barriers to Reach Goals: Other (Comment) (deconditioning)      Swallow Study   General Date of Onset: 04/17/22 HPI: Per H&P: "Jean Johnson is a 71 y.o. 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 Pt was diagnosed with COVID on 04/04/2022. Pt was treated with a course of doxycycline and prednisone by PCP. She states that she has chronic dizziness. She was seen by ENT and ophthalmology with negative work-up. She had MRI of brain on  08/06/21 which was negative  for stroke. Her neurologist is referring her to [facility] for further work-up of dizziness. She continues to have dizziness and lightheadedness. She endorses sensation of feeling lightheaded as well as room spinning.  No unilateral numbness in extremities.  No facial droop or slurred speech.  Patient has mild dry cough, denies chest pain, shortness breath, fever or chills.  Patient had nausea and vomited twice yesterday which has resolved.  Currently no nausea vomiting, diarrhea or abdominal pain.   Denies symptoms of UTI.  Per EDP, patient had syncopal episode while in triage and was rushed back to treatment room."  CXR 04/17/22: "Increasing opacity and volume loss within the mid and lower LEFT lung with possible superimposed LEFT pleural effusion. This likely represents a combination of increasing airspace disease and atelectasis." CT Head 04/14/22: "No acute intracranial abnormalities. The appearance of the brain is normal for age." Pt is currently on room air and regular solids and thin liquids diet. RN reporting pt with congest cough and limited appetite, but no acute issue with intake. Type of Study: Bedside Swallow Evaluation Previous Swallow Assessment: none in chart Diet Prior to this Study: Regular;Thin liquids Temperature Spikes Noted: Yes (WBC 7.7; Temp 100.6) Respiratory Status: Room air History of Recent Intubation: No Behavior/Cognition: Alert;Cooperative;Pleasant mood Oral Cavity Assessment: Within Functional Limits Oral Care Completed by SLP: Yes Oral Cavity - Dentition: Adequate natural dentition Vision: Functional for self-feeding Self-Feeding Abilities: Able to feed self Patient Positioning: Upright in bed Baseline Vocal Quality: Normal Volitional Cough: Congested Volitional Swallow: Able to elicit    Oral/Motor/Sensory Function Overall Oral Motor/Sensory Function: Within functional limits   Ice Chips Ice chips: Not tested   Thin Liquid Thin Liquid: Within functional limits    Nectar Thick Nectar Thick Liquid: Not tested   Honey Thick Honey Thick Liquid: Not tested   Puree Puree: Within functional limits   Solid  [place] Corynn Solberg Clapp  MS [facility] SLP Solid: Within functional limits      [place] J Clapp 04/17/2022,5:22 PM

## 2022-04-17 NOTE — Progress Notes (Signed)
Patient stated she was felling dizzy.  Attempted to stand from be to [facility]  and she was to unstable to stand with max assist.  Purewick placed for urinary incontinence.

## 2022-04-17 NOTE — Progress Notes (Signed)
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  Patient presents to the emergency department with complaints of weakness, dizziness, nausea, and vomiting.  Patient states she was recently diagnosed with COVID-19.  She has been admitted under observation for Dehydration [E86.0] Dizziness [R42] Syncope [R55] Hypoglycemia [E16.2] Generalized weakness [R53.1] AKI (acute kidney injury) ([place]) [N17.9] Syncope, unspecified syncope type [R55] COVID-19 [U07.1] Acute renal failure superimposed on stage 3a chronic kidney disease ([place]) [N17.9, N18.31]  Patient is known to our practice and receives outpatient follow-up with Dr. Lanora Manis.  She was last seen in office in August.    Patient seen resting in bed, son at bedside. Reports increased dizziness and weakness overnight, confirmed by nursing notes. Has nonproductive cough. Appetite remains poor.   Febrile Tmax 100.83F Creatinine 1.94   Objective:  Vital signs in last 24 hours:  Temp:  [98.8 F (37.1 C)-100.6 F (38.1 C)] 99.1 F (37.3 C) (10/08 1009) Pulse Rate:  [72-88] 88 (10/08 0734) Resp:  [17-20] 17 (10/08 0734) BP: (143-152)/(56-75) 143/71 (10/08 0734) SpO2:  [97 %-100 %] 97 % (10/08 0734)  Weight change:  Filed Weights   04/14/22 0409  Weight: 50.8 kg    Intake/Output: I/O last 3 completed shifts: In: 1254.8 [I.V.:1254.8] Out: 1 [Urine:1]   Intake/Output this shift:  No intake/output data recorded.  Physical Exam: General: NAD  Head: Normocephalic, atraumatic. Moist oral mucosal membranes  Eyes: Anicteric  Lungs:  Clear to auscultation, normal effort, room air  Heart: Regular rate and rhythm  Abdomen:  Soft, nontender, nondistended  Extremities: No peripheral edema.  Neurologic:  Nonfocal, moving all four extremities  Skin: No lesions  Access: Left aVF    Basic Metabolic Panel: Recent Labs  Lab 04/14/22 0414 04/15/22 0605 04/16/22 0555 04/17/22 0548  NA 143 142 143 141  K 3.4* 4.7 4.1 3.9  CL 113* 117* 118* 107  CO2 20* 21* 22 24  GLUCOSE 67* 298* 111* 96  BUN 36* 44* 43* 39*  CREATININE 2.52* 2.27* 1.87* 1.94*  CALCIUM 8.1* 8.2* 8.8* 9.7  MG 2.0  --  2.0  --      Liver Function Tests: Recent Labs  Lab 04/14/22 0436  AST 21  ALT 9  ALKPHOS 55  BILITOT 0.4  PROT 5.8*  ALBUMIN 2.9*    Recent Labs  Lab 04/14/22 0436  LIPASE 36    No results for input(s): "AMMONIA" in the last 168 hours.  CBC: Recent Labs  Lab 04/14/22 0414 04/16/22 0555 04/17/22 0548  WBC 4.8 7.4 7.7  NEUTROABS 2.5  --   --   HGB 12.2 10.8* 11.8*  HCT 39.6 34.4* 36.3  MCV 97.3 96.9 94.8  PLT 180 177 164     Cardiac Enzymes: No results for input(s): "CKTOTAL", "CKMB", "CKMBINDEX", "TROPONINI" in the last 168 hours.  BNP: Invalid input(s): "POCBNP"  CBG: Recent Labs  Lab 04/16/22 0747 04/16/22 1152 04/16/22 1620 04/16/22 2201 04/17/22 0730  GLUCAP 119* 88 117* 109* 32     Microbiology: Results for orders placed or performed during the hospital encounter of 04/14/22  Resp Panel by RT-PCR (Flu A&B, Covid) Anterior Nasal Swab     Status: Abnormal   Collection Time: 04/14/22  4:53 AM   Specimen: Anterior Nasal Swab  Result Value Ref Range Status   SARS Coronavirus  2 by RT PCR POSITIVE (A) NEGATIVE Final    Comment: (NOTE) SARS-CoV-2 target nucleic acids are DETECTED.  The SARS-CoV-2 RNA is generally detectable in upper respiratory specimens during the acute phase of infection. Positive results are indicative of the presence of the identified virus, but do not rule out bacterial infection or co-infection with other pathogens not detected by the test. Clinical correlation with patient history and other diagnostic information is necessary to  determine patient infection status. The expected result is Negative.  Fact Sheet for Patients: EntrepreneurPulse.com.au  Fact Sheet for Healthcare Providers: IncredibleEmployment.be  This test is not yet approved or cleared by the [place] FDA and  has been authorized for detection and/or diagnosis of SARS-CoV-2 by FDA under an Emergency Use Authorization (EUA).  This EUA will remain in effect (meaning this test can be used) for the duration of  the COVID-19 declaration under Section 564(b)(1) of the A ct, 21 U.S.C. section 360bbb-3(b)(1), unless the authorization is terminated or revoked sooner.     Influenza A by PCR NEGATIVE NEGATIVE Final   Influenza B by PCR NEGATIVE NEGATIVE Final    Comment: (NOTE) The Xpert Xpress SARS-CoV-2/FLU/RSV plus assay is intended as an aid in the diagnosis of influenza from Nasopharyngeal swab specimens and should not be used as a sole basis for treatment. Nasal washings and aspirates are unacceptable for Xpert Xpress SARS-CoV-2/FLU/RSV testing.  Fact Sheet for Patients: EntrepreneurPulse.com.au  Fact Sheet for Healthcare Providers: IncredibleEmployment.be  This test is not yet approved or cleared by the [place] FDA and has been authorized for detection and/or diagnosis of SARS-CoV-2 by FDA under an Emergency Use Authorization (EUA). This EUA will remain in effect (meaning this test can be used) for the duration of the COVID-19 declaration under Section 564(b)(1) of the Act, 21 U.S.C. section 360bbb-3(b)(1), unless the authorization is terminated or revoked.  Performed at [facility], [address] [place], [place] [address]   Culture, blood (routine x 2)     Status: None (Preliminary result)   Collection Time: 04/14/22  8:32 AM   Specimen: BLOOD  Result Value Ref Range Status   Specimen Description BLOOD Blood Culture adequate volume  Final    Special Requests BLOOD RIGHT FOREARM  Final   Culture   Final    NO GROWTH 3 DAYS Performed at [facility], [address], [place], [place] [address]    Report Status PENDING  Incomplete  Culture, blood (routine x 2)     Status: None (Preliminary result)   Collection Time: 04/14/22  8:32 AM   Specimen: BLOOD  Result Value Ref Range Status   Specimen Description BLOOD Blood Culture adequate volume  Final   Special Requests BLOOD RIGHT HAND  Final   Culture   Final    NO GROWTH 3 DAYS Performed at [facility], [address] [place], [place] [address]    Report Status PENDING  Incomplete    Coagulation Studies: No results for input(s): "LABPROT", "INR" in the last 72 hours.  Urinalysis: No results for input(s): "COLORURINE", "LABSPEC", "PHURINE", "GLUCOSEU", "HGBUR", "BILIRUBINUR", "KETONESUR", "PROTEINUR", "UROBILINOGEN", "NITRITE", "LEUKOCYTESUR" in the last 72 hours.  Invalid input(s): "APPERANCEUR"    Imaging: No results found.   Medications:    lactated ringers       apixaban  2.5 mg Oral BID   atorvastatin  10 mg Oral Daily   cyanocobalamin  2,000 mcg Oral Daily   diltiazem  240 mg Oral QHS  ezetimibe  10 mg Oral Daily   flecainide  50 mg Oral Q12H   guaiFENesin  1,200 mg Oral BID   insulin aspart  0-5 Units Subcutaneous QHS   insulin aspart  0-9 Units Subcutaneous TID WC   Ipratropium-Albuterol  1 puff Inhalation Q6H   mycophenolate  500 mg Oral BID   predniSONE  5 mg Oral Q breakfast   pregabalin  25 mg Oral BID   tacrolimus  3 mg Oral BID   acetaminophen, albuterol, dextromethorphan-guaiFENesin, hydrALAZINE, meclizine, ondansetron (ZOFRAN) IV  Assessment/ Plan:  [name] is a 71 y.o.  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  Patient presents to the emergency  department with complaints of weakness, dizziness, nausea, and vomiting.  Patient states she was recently diagnosed with COVID-19.  She has been admitted under observation for Dehydration [E86.0] Dizziness [R42] Syncope [R55] Hypoglycemia [E16.2] Generalized weakness [R53.1] AKI (acute kidney injury) ([place]) [N17.9] Syncope, unspecified syncope type [R55] COVID-19 [U07.1] Acute renal failure superimposed on stage 3a chronic kidney disease ([place]) [N17.9, N18.31]   Acute Kidney Injury on chronic kidney disease stage IIIb with baseline creatinine 1.34 on 02/14/2022.  Acute kidney injury secondary to acute dehydration Chronic kidney disease is secondary to diabetes.  Patient has received 2 renal transplants in the past, current transplant still functional.  No IV contrast exposure.  Renal ultrasound negative for obstruction.     Creatinine stable today. Concerned about prolonged loss of appetite. Will order Lactated ringers 49ml/hr. Will continue to monitor and encourage oral intake. Continue immunosuppressant regimen.   Lab Results  Component Value Date   CREATININE 1.94 (H) 04/17/2022   CREATININE 1.87 (H) 04/16/2022   CREATININE 2.27 (H) 04/15/2022    Intake/Output Summary (Last 24 hours) at 04/17/2022 1026 Last data filed at 04/16/2022 1330 Gross per 24 hour  Intake --  Output 1 ml  Net -1 ml    2. Anemia of chronic kidney disease Lab Results  Component Value Date   HGB 11.8 (L) 04/17/2022  Hemoglobin stable  3. Diabetes mellitus type II with chronic kidney disease/renal manifestations: insulin dependent. Home regimen includes Levemir and Humalog. Most recent hemoglobin A1c is 5.6 on 03/25/21.   Glucose well controlled, sliding scale insulin managed by primary team.  4.  Hypertension with chronic kidney disease.  Home regimen includes diltiazem, Zetia, lisinopril, and furosemide.  Received Torsemide 40mg  yesterday.    LOS: 2   10/8/202310:26 AM

## 2022-04-17 NOTE — Progress Notes (Signed)
  Progress Note   Patient: Jean Johnson CZY:[id number] DOB: 1951/02/18 DOA: 04/14/2022     2 DOS: the patient was seen and examined on 04/17/2022   Brief hospital course: Shalece Staffa is a 71 y.o. 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 Patient condition appears to be secondary to dehydration, with acute renal failure on chronic kidney disease stage IIIa.  Was given IV fluids.  10/7.  Renal functions better today, patient developed mild short of breath, give 40 mg oral torsemide.  10/8.  Had a fever of 100.6, chest x-ray repeated, still left lower lobe infiltrates.  Procalcitonin level less than 0.1, repeat tomorrow. Obtain speech therapy evaluation.  Assessment and Plan:  * Syncope Secondary to dehydration.  No recurrence.   Acute renal failure superimposed on stage 3a chronic kidney disease ([place]) History of renal transplant. Condition stabilized, nephrology started lower dose of lactated ringer, will follow closely.   Atrial fibrillation, chronic ([place]) Continue current treatment.   COVID-19 virus infection Patient had persistent positive COVID due to immunocompromise status.  No need for treatment at this time. Patient had low-grade fever 100.6 today, repeat chest x-ray still has a left lower lobe infiltrates, appears to be chronic.  Procalcitonin level 0.1, will repeat tomorrow.  Pneumonia appears to be secondary to COVID. We will also obtain speech therapy evaluation to rule out aspiration.  HLD (hyperlipidemia) Zetia, Lipitor   Essential hypertension Continue current treatment.   Hypokalemia Condition improved.   Myocardial injury Due to dehydration and near syncope.   Type II diabetes mellitus with renal  manifestations ([facility]) Continue current regimen.     Subjective:  Patient still has significant cough, productive, but not able to cough up. No signal short of breath.  Physical Exam: Vitals:   04/16/22 1619 04/17/22 0448 04/17/22 0734 04/17/22 1009  BP: (!) 146/56 (!) 152/75 (!) 143/71   Pulse: 76 72 88   Resp: 20 18 17    Temp: 98.8 F (37.1 C) 99.7 F (37.6 C) (!) 100.6 F (38.1 C) 99.1 F (37.3 C)  TempSrc:    Oral  SpO2: 100% 97% 97%   Weight:      Height:       General exam: Appears calm and comfortable  Respiratory system: Left base crackles seem to be better today. Respiratory effort normal. Cardiovascular system: S1 & S2 heard, RRR. No JVD, murmurs, rubs, gallops or clicks. No pedal edema. Gastrointestinal system: Abdomen is nondistended, soft and nontender. No organomegaly or masses felt. Normal bowel sounds heard. Central nervous system: Alert and oriented x3. No focal neurological deficits. Extremities: Symmetric 5 x 5 power. Skin: No rashes, lesions or ulcers Psychiatry:  Mood & affect appropriate.   Data Reviewed:  Chest x-ray and lab results reviewed.  Family Communication: Husband updated at bedside.  Disposition: Status is: Inpatient Remains inpatient appropriate because: Severity of disease.  Planned Discharge Destination: Home with Home Health    Time spent: 35 minutes  Author: Sharen Hones, MD 04/17/2022 11:10 AM  For on call review [email or web address].

## 2022-04-17 NOTE — TOC Progression Note (Signed)
Transition of Care [facility]) - Progression Note    Patient Details  Name: Jean Johnson MRN: [id number] Date of Birth: 06-02-1951  [facility] of [facility]) CM/SW Contact  Izola Price, RN Phone Number: 04/17/2022, 11:34 AM  Clinical Narrative: 10/8: Left VM on spouses phone for call RN CM back per request and to discuss [place] options. Patient is not medically stable at this time. Followed by nephrology prior to admission and sees Dr. Lanora Manis as outpatient, last seen in August 2023 and seen by NP last in acute setting.   130 pm Update: Spouse returned call and was wanting update on results of tests for increased coughing. Explained that would need to come from provider. Also discussed HH options but patient and spouse want to discuss further but had no current preference until he spoke more to spouse when her condition improved. Simmie Davies [facility]          Expected Discharge Plan and Services                                                 Social Determinants of Health (SDOH) Interventions    Readmission Risk Interventions     No data to display

## 2022-04-18 DIAGNOSIS — U071 COVID-19: Secondary | ICD-10-CM | POA: Diagnosis not present

## 2022-04-18 DIAGNOSIS — R338 Other retention of urine: Secondary | ICD-10-CM

## 2022-04-18 DIAGNOSIS — I5033 Acute on chronic diastolic (congestive) heart failure: Secondary | ICD-10-CM

## 2022-04-18 DIAGNOSIS — R55 Syncope and collapse: Secondary | ICD-10-CM | POA: Diagnosis not present

## 2022-04-18 DIAGNOSIS — E872 Acidosis, unspecified: Secondary | ICD-10-CM

## 2022-04-18 DIAGNOSIS — J208 Acute bronchitis due to other specified organisms: Secondary | ICD-10-CM | POA: Diagnosis not present

## 2022-04-18 DIAGNOSIS — N179 Acute kidney failure, unspecified: Secondary | ICD-10-CM | POA: Diagnosis not present

## 2022-04-18 LAB — PROCALCITONIN: Procalcitonin: <0.1 ng/mL

## 2022-04-18 LAB — BASIC METABOLIC PANEL
Anion gap: 5 (ref 5–15)
BUN: 36 mg/dL — ABNORMAL HIGH (ref 8–23)
CO2: 26 mmol/L (ref 22–32)
Calcium: 8.6 mg/dL — ABNORMAL LOW (ref 8.9–10.3)
Chloride: 110 mmol/L (ref 98–111)
Creatinine, Ser: 1.69 mg/dL — ABNORMAL HIGH (ref 0.44–1.00)
GFR, Estimated: 32 mL/min — ABNORMAL LOW (ref >60)
Glucose, Bld: 79 mg/dL (ref 70–99)
Potassium: 3.9 mmol/L (ref 3.5–5.1)
Sodium: 141 mmol/L (ref 135–145)

## 2022-04-18 LAB — MAGNESIUM: Magnesium: 1.6 mg/dL — ABNORMAL LOW (ref 1.7–2.4)

## 2022-04-18 LAB — GLUCOSE, CAPILLARY
Glucose-Capillary: 80 mg/dL (ref 70–99)
Glucose-Capillary: 100 mg/dL — ABNORMAL HIGH (ref 70–99)
Glucose-Capillary: 183 mg/dL — ABNORMAL HIGH (ref 70–99)
Glucose-Capillary: 218 mg/dL — ABNORMAL HIGH (ref 70–99)
Glucose-Capillary: 162 mg/dL — ABNORMAL HIGH (ref 70–99)

## 2022-04-18 MED ORDER — MAGNESIUM SULFATE 2 GM/50ML IV SOLN
2.0000 g | Freq: Once | INTRAVENOUS | Status: AC
  Administered 2022-04-18: 2 g via INTRAVENOUS
  Filled 2022-04-18: qty 50

## 2022-04-18 MED ORDER — LACTULOSE 10 GM/15ML PO SOLN
20.0000 g | Freq: Once | ORAL | Status: AC
  Administered 2022-04-18: 20 g via ORAL
  Filled 2022-04-18 (×2): qty 30

## 2022-04-18 MED ORDER — SENNOSIDES-DOCUSATE SODIUM 8.6-50 MG PO TABS
2.0000 | ORAL_TABLET | Freq: Two times a day (BID) | ORAL | Status: DC
  Administered 2022-04-19: 2 via ORAL
  Filled 2022-04-18 (×2): qty 2

## 2022-04-18 MED ORDER — SODIUM CHLORIDE 0.9 % IV SOLN: 6.2500 mg | Freq: Four times a day (QID) | INTRAVENOUS | Status: DC | PRN

## 2022-04-18 MED ORDER — TAMSULOSIN HCL 0.4 MG PO CAPS
0.4000 mg | ORAL_CAPSULE | Freq: Every day | ORAL | Status: DC
  Administered 2022-04-18: 0.4 mg via ORAL
  Filled 2022-04-18: qty 1

## 2022-04-18 MED ORDER — SODIUM CHLORIDE 0.9 % IV SOLN: INTRAVENOUS | Status: DC | PRN

## 2022-04-18 MED ORDER — PREDNISONE 20 MG PO TABS
40.0000 mg | ORAL_TABLET | Freq: Every day | ORAL | Status: DC
  Administered 2022-04-18 – 2022-04-19 (×2): 40 mg via ORAL
  Filled 2022-04-18 (×3): qty 2

## 2022-04-18 NOTE — Progress Notes (Signed)
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  Patient presents to the emergency department with complaints of weakness, dizziness, nausea, and vomiting.  Patient states she was recently diagnosed with COVID-19.  She has been admitted under observation for Dehydration [E86.0] Dizziness [R42] Syncope [R55] Hypoglycemia [E16.2] Generalized weakness [R53.1] AKI (acute kidney injury) ([place]) [N17.9] Syncope, unspecified syncope type [R55] COVID-19 [U07.1] Acute renal failure superimposed on stage 3a chronic kidney disease ([place]) [N17.9, N18.31]  Patient is known to our practice and receives outpatient follow-up with Dr. Lanora Manis.  She was last seen in office in August.    Patient seen laying in bed Alert and oriented No family at bedside Continues to have cough, nonproductive.  Appetite slowly returning  Afebrile Creatinine 1.69   Objective:  Vital signs in last 24 hours:  Temp:  [97.6 F (36.4 C)-98.8 F (37.1 C)] 98.8 F (37.1 C) (10/09 0744) Pulse Rate:  [55-80] 78 (10/09 0744) Resp:  [16-20] 17 (10/09 0744) BP: (127-148)/(65-73) 148/68 (10/09 0744) SpO2:  [93 %-100 %] 95 % (10/09 0744)  Weight change:  Filed Weights   04/14/22 0409  Weight: 50.8 kg    Intake/Output: I/O last 3 completed shifts: In: 885.7 [I.V.:885.7] Out: 1200 [Urine:1200]   Intake/Output this shift:  Total I/O In: 119.2 [I.V.:69.2; IV Piggyback:50] Out: 1025 [Urine:875; Emesis/NG output:150]  Physical Exam: General: NAD  Head: Normocephalic, atraumatic. Moist oral mucosal membranes  Eyes: Anicteric  Lungs:  Clear to auscultation, normal effort, room air  Heart: Regular rate and rhythm  Abdomen:  Soft, nontender, nondistended  Extremities: No peripheral edema.   Neurologic: Nonfocal, moving all four extremities  Skin: No lesions  Access: Left aVF    Basic Metabolic Panel: Recent Labs  Lab 04/14/22 0414 04/15/22 0605 04/16/22 0555 04/17/22 0548 04/18/22 0406  NA 143 142 143 141 141  K 3.4* 4.7 4.1 3.9 3.9  CL 113* 117* 118* 107 110  CO2 20* 21* 22 24 26   GLUCOSE 67* 298* 111* 96 79  BUN 36* 44* 43* 39* 36*  CREATININE 2.52* 2.27* 1.87* 1.94* 1.69*  CALCIUM 8.1* 8.2* 8.8* 9.7 8.6*  MG 2.0  --  2.0  --  1.6*     Liver Function Tests: Recent Labs  Lab 04/14/22 0436  AST 21  ALT 9  ALKPHOS 55  BILITOT 0.4  PROT 5.8*  ALBUMIN 2.9*    Recent Labs  Lab 04/14/22 0436  LIPASE 36    No results for input(s): "AMMONIA" in the last 168 hours.  CBC: Recent Labs  Lab 04/14/22 0414 04/16/22 0555 04/17/22 0548  WBC 4.8 7.4 7.7  NEUTROABS 2.5  --   --   HGB 12.2 10.8* 11.8*  HCT 39.6 34.4* 36.3  MCV 97.3 96.9 94.8  PLT 180 177 164     Cardiac Enzymes: No results for input(s): "CKTOTAL", "CKMB", "CKMBINDEX", "TROPONINI" in the last 168 hours.  BNP: Invalid input(s): "POCBNP"  CBG: Recent Labs  Lab 04/17/22 1645 04/17/22 1956 04/17/22 2350 04/18/22 0744 04/18/22 1126  GLUCAP 153* 116* 83 92 100*     Microbiology: Results for orders placed or performed during the hospital encounter of 04/14/22  Resp Panel by RT-PCR (Flu A&B, Covid) Anterior Nasal Swab     Status: Abnormal   Collection Time: 04/14/22  4:53 AM   Specimen:  Anterior Nasal Swab  Result Value Ref Range Status   SARS Coronavirus 2 by RT PCR POSITIVE (A) NEGATIVE Final    Comment: (NOTE) SARS-CoV-2 target nucleic acids are DETECTED.  The SARS-CoV-2 RNA is generally detectable in upper respiratory specimens during the acute phase of infection. Positive results are indicative of the presence of the identified virus, but do not rule out bacterial infection or co-infection with other pathogens not detected by the test. Clinical correlation with  patient history and other diagnostic information is necessary to determine patient infection status. The expected result is Negative.  Fact Sheet for Patients: EntrepreneurPulse.com.au  Fact Sheet for Healthcare Providers: IncredibleEmployment.be  This test is not yet approved or cleared by the [place] FDA and  has been authorized for detection and/or diagnosis of SARS-CoV-2 by FDA under an Emergency Use Authorization (EUA).  This EUA will remain in effect (meaning this test can be used) for the duration of  the COVID-19 declaration under Section 564(b)(1) of the A ct, 21 U.S.C. section 360bbb-3(b)(1), unless the authorization is terminated or revoked sooner.     Influenza A by PCR NEGATIVE NEGATIVE Final   Influenza B by PCR NEGATIVE NEGATIVE Final    Comment: (NOTE) The Xpert Xpress SARS-CoV-2/FLU/RSV plus assay is intended as an aid in the diagnosis of influenza from Nasopharyngeal swab specimens and should not be used as a sole basis for treatment. Nasal washings and aspirates are unacceptable for Xpert Xpress SARS-CoV-2/FLU/RSV testing.  Fact Sheet for Patients: EntrepreneurPulse.com.au  Fact Sheet for Healthcare Providers: IncredibleEmployment.be  This test is not yet approved or cleared by the [place] FDA and has been authorized for detection and/or diagnosis of SARS-CoV-2 by FDA under an Emergency Use Authorization (EUA). This EUA will remain in effect (meaning this test can be used) for the duration of the COVID-19 declaration under Section 564(b)(1) of the Act, 21 U.S.C. section 360bbb-3(b)(1), unless the authorization is terminated or revoked.  Performed at [facility], [address] [place], [place] [address]   Culture, blood (routine x 2)     Status: None (Preliminary result)   Collection Time: 04/14/22  8:32 AM   Specimen: BLOOD  Result Value Ref Range Status    Specimen Description BLOOD Blood Culture adequate volume  Final   Special Requests BLOOD RIGHT FOREARM  Final   Culture   Final    NO GROWTH 4 DAYS Performed at [facility], [address], [place], [place] [address]    Report Status PENDING  Incomplete  Culture, blood (routine x 2)     Status: None (Preliminary result)   Collection Time: 04/14/22  8:32 AM   Specimen: BLOOD  Result Value Ref Range Status   Specimen Description BLOOD Blood Culture adequate volume  Final   Special Requests BLOOD RIGHT HAND  Final   Culture   Final    NO GROWTH 4 DAYS Performed at [facility], [address], [place], [place] [address]    Report Status PENDING  Incomplete    Coagulation Studies: No results for input(s): "LABPROT", "INR" in the last 72 hours.  Urinalysis: Recent Labs    04/17/22 1406  COLORURINE STRAW*  LABSPEC 1.012  PHURINE 5.0  GLUCOSEU NEGATIVE  HGBUR MODERATE*  BILIRUBINUR NEGATIVE  KETONESUR NEGATIVE  PROTEINUR 100*  NITRITE NEGATIVE  LEUKOCYTESUR NEGATIVE      Imaging: DG Chest Port 1 View  Result Date: 04/17/2022 CLINICAL DATA:  Pneumonia. EXAM: PORTABLE CHEST 1 VIEW COMPARISON:  04/14/2022  FINDINGS: Increasing opacity and volume loss within the mid and lower LEFT lung noted with possible superimposed LEFT pleural effusion. Mild RIGHT lung interstitial opacities again noted. There is no evidence of pneumothorax. No acute bony abnormalities are identified. IMPRESSION: Increasing opacity and volume loss within the mid and lower LEFT lung with possible superimposed LEFT pleural effusion. This likely represents a combination of increasing airspace disease and atelectasis. Electronically Signed   By: Margarette Canada M.D.   On: 04/17/2022 10:31     Medications:    sodium chloride Stopped (04/18/22 1123)   promethazine (PHENERGAN) injection (IM or IVPB)       apixaban  2.5 mg Oral BID   atorvastatin  10 mg Oral Daily   cyanocobalamin  2,000 mcg  Oral Daily   diltiazem  240 mg Oral QHS   ezetimibe  10 mg Oral Daily   flecainide  50 mg Oral Q12H   guaiFENesin  1,200 mg Oral BID   insulin aspart  0-5 Units Subcutaneous QHS   insulin aspart  0-9 Units Subcutaneous TID WC   Ipratropium-Albuterol  1 puff Inhalation Q6H   mycophenolate  500 mg Oral BID   predniSONE  40 mg Oral Q breakfast   pregabalin  25 mg Oral BID   senna-docusate  2 tablet Oral BID   tacrolimus  3 mg Oral BID   tamsulosin  0.4 mg Oral QPC supper   sodium chloride, acetaminophen, albuterol, dextromethorphan-guaiFENesin, hydrALAZINE, meclizine, ondansetron (ZOFRAN) IV, promethazine (PHENERGAN) injection (IM or IVPB)  Assessment/ Plan:  Jean Johnson is a 71 y.o.  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  Patient presents to the emergency department with complaints of weakness, dizziness, nausea, and vomiting.  Patient states she was recently diagnosed with COVID-19.  She has been admitted under observation for Dehydration [E86.0] Dizziness [R42] Syncope [R55] Hypoglycemia [E16.2] Generalized weakness [R53.1] AKI (acute kidney injury) ([place]) [N17.9] Syncope, unspecified syncope type [R55] COVID-19 [U07.1] Acute renal failure superimposed on stage 3a chronic kidney disease ([place]) [N17.9, N18.31]   Acute Kidney Injury on chronic kidney disease stage IIIb with baseline creatinine 1.34 on 02/14/2022.  Acute kidney injury secondary to acute dehydration Chronic kidney disease is secondary to diabetes.  Patient has received 2 renal transplants in the past, current transplant still functional.  No IV contrast exposure.  Renal ultrasound negative for obstruction.     Creatinine improved with IVF. Patient encouraged to increase oral intake. Continue immunosuppressant regimen.   Lab Results  Component Value Date    CREATININE 1.69 (H) 04/18/2022   CREATININE 1.94 (H) 04/17/2022   CREATININE 1.87 (H) 04/16/2022    Intake/Output Summary (Last 24 hours) at 04/18/2022 1352 Last data filed at 04/18/2022 1335 Gross per 24 hour  Intake 1004.9 ml  Output 1775 ml  Net -770.1 ml    2. Anemia of chronic kidney disease Lab Results  Component Value Date   HGB 11.8 (L) 04/17/2022  Hemoglobin within target  3. Diabetes mellitus type II with chronic kidney disease/renal manifestations: insulin dependent. Home regimen includes Levemir and Humalog. Most recent hemoglobin A1c is 5.6 on 03/25/21.   Primary team will manage sliding scale insulin  4.  Hypertension with chronic kidney disease.  Home regimen includes diltiazem, Zetia, lisinopril, and furosemide.  Received intermittent Torsemide.   LOS: 3   10/9/20231:52 PM

## 2022-04-18 NOTE — Progress Notes (Signed)
Occupational Therapy Treatment Patient Details Name: Jean Johnson MRN: [id number] DOB: 1950-11-19 Today's Date: 04/18/2022   History of present illness Kimmberly Wisser is a 71 y.o. 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   OT comments  Upon entering session, pt sitting in recliner with PT present. Pt agreeable to OT. Tx session targeted improving tolerance for functional mobility in the setting of ADL tasks. Pt stood from recliner with Min guard and took steps toward EOB with Min guard using RW. Pt required Min A for seated grooming tasks, Min A for UB dressing, and completed sit to supine with supervision. Pt is making progress toward goal completion. D/C recommendation remains appropriate. OT will continue to follow acutely.    Recommendations for follow up therapy are one component of a multi-disciplinary discharge planning process, led by the attending physician.  Recommendations may be updated based on patient status, additional functional criteria and insurance authorization.    Follow Up Recommendations  Home health OT    Assistance Recommended at Discharge Intermittent Supervision/Assistance  Patient can return home with the following  A little help with walking and/or transfers;A little help with bathing/dressing/bathroom;Help with stairs or ramp for entrance;Assistance with cooking/housework;Assist for transportation   Equipment Recommendations  None recommended by OT    Recommendations for Other Services      Precautions / Restrictions Precautions Precautions: Fall Restrictions Weight Bearing Restrictions: No       Mobility Bed Mobility Overal bed mobility: Needs Assistance Bed Mobility: Sit to Supine       Sit  to supine: Supervision        Transfers Overall transfer level: Needs assistance Equipment used: Rolling walker (2 wheels) Transfers: Sit to/from Stand Sit to Stand: Min guard           General transfer comment: STS from recliner     Balance Overall balance assessment: Needs assistance Sitting-balance support: No upper extremity supported, Feet supported Sitting balance-Leahy Scale: Good     Standing balance support: Bilateral upper extremity supported, During functional activity Standing balance-Leahy Scale: Fair                             ADL either performed or assessed with clinical judgement   ADL Overall ADL's : Needs assistance/impaired     Grooming: Oral care;Sitting;Set up;Minimal assistance Grooming Details (indicate cue type and reason): assist for opening toothpaste         Upper Body Dressing : Minimal assistance;Sitting Upper Body Dressing Details (indicate cue type and reason): to don gown                 Functional mobility during ADLs: Min guard;Rolling walker (2 wheels) (to take ~3 steps from recliner > EOB)      Extremity/Trunk Assessment Upper Extremity Assessment Upper Extremity Assessment: Overall WFL for tasks assessed   Lower Extremity Assessment Lower Extremity Assessment: Generalized weakness        Vision       Perception     Praxis      Cognition Arousal/Alertness: Awake/alert Behavior During Therapy: WFL for tasks assessed/performed Overall Cognitive Status: Within Functional Limits for tasks assessed  General Comments: Pt reported feeling "weak" today        Exercises      Shoulder Instructions       General Comments      Pertinent Vitals/ Pain       Pain Assessment Pain Assessment: No/denies pain  Home Living                                          Prior Functioning/Environment              Frequency  Min  2X/week        Progress Toward Goals  OT Goals(current goals can now be found in the care plan section)  Progress towards OT goals: Progressing toward goals  Acute Rehab OT Goals Patient Stated Goal: to go home OT Goal Formulation: With patient Time For Goal Achievement: 04/29/22 Potential to Achieve Goals: Good  Plan Frequency remains appropriate    Co-evaluation                 AM-PAC OT "6 Clicks" Daily Activity     Outcome Measure   Help from another person eating meals?: None Help from another person taking care of personal grooming?: A Little Help from another person toileting, which includes using toliet, bedpan, or urinal?: A Little Help from another person bathing (including washing, rinsing, drying)?: A Little Help from another person to put on and taking off regular upper body clothing?: None Help from another person to put on and taking off regular lower body clothing?: A Little 6 Click Score: 20    End of Session Equipment Utilized During Treatment: Rolling walker (2 wheels)  OT Visit Diagnosis: Other abnormalities of gait and mobility (R26.89);Muscle weakness (generalized) (M62.81)   Activity Tolerance Patient tolerated treatment well   Patient Left in bed;with call bell/phone within reach;with bed alarm set (NT present to take vitals)   Nurse Communication Mobility status        Time: 1550-1620 OT Time Calculation (min): 30 min  Charges: OT General Charges $OT Visit: 1 Visit OT Treatments $Self Care/Home Management : 23-37 mins  [facility] MS, OTR/L ascom [phone]  04/18/22, 5:23 PM

## 2022-04-18 NOTE — Progress Notes (Addendum)
Progress Note   Patient: Jean Johnson MVE:[id number] DOB: 1951/01/31 DOA: 04/14/2022     3 DOS: the patient was seen and examined on 04/18/2022   Brief hospital course: Jean Johnson is a 71 y.o. 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 Patient condition appears to be secondary to dehydration, with acute renal failure on chronic kidney disease stage IIIa.  Was given IV fluids.  10/7.  Renal functions better today, patient developed mild short of breath, give 40 mg oral torsemide.  10/8.  Had a fever of 100.6, chest x-ray repeated, still left lower lobe infiltrates.  Procalcitonin level less than 0.1, repeat tomorrow. Obtain speech therapy evaluation.  10/9.  Still has significant productive cough, serial procalcitonin level less than 0.1.  Started prednisone 40 mg daily for acute bronchitis secondary to COVID.  Assessment and Plan: * Syncope Secondary to dehydration.  No recurrence.  Acute on chronic diastolic congestive heart failure. Patient had a short of breath 2 days ago, chest x-ray showed mild congestive heart failure changes.  She received 1 dose of torsemide.  Shortness of breath much improved afterwards.   Acute renal failure superimposed on stage 3a chronic kidney disease ([place]) History of renal transplant. Metabolic acidosis. Renal function has back to baseline.   Patient also had mild metabolic acidosis at the time of admission, that has improved with improvement of renal function. Tacrolimus level 5.8, within therapeutic range.   Atrial fibrillation, chronic ([place]) Continue current treatment.   Acute bronchitis due to COVID-19 infection. Patient had persistent positive COVID due to immunocompromise status.  No need  for treatment at this time. Patient has some left lower lobe infiltrates, but serial procalcitonin level less than 0.1, no bacterial infection. Patient also been seen by speech therapy, mild aspiration risk. Patient still have significant cough, mild short of breath, consistent with acute bronchitis caused by COVID.  Will increase prednisone dose to 40 mg daily.  Acute urinary retention. Urine retention today with residual more than 400, will give straight cath.  Send a UA again. Also start Flomax. Continue as needed bladder scan, cath if residuals more than 300 mL.  Hypomagnesemia. Repleted.   HLD (hyperlipidemia) Zetia, Lipitor   Essential hypertension Continue current treatment.   Hypokalemia Condition improved.   Myocardial injury Due to dehydration and near syncope.   Type II diabetes mellitus with renal manifestations (H[facility]) Continue current regimen.      Subjective:  Patient does not feel well today, no additional fever.  Feels tired.  Still cough, productive, but could not cough all the way up, she ends up swallowing it. She has mild shortness of breath. Denies nausea vomiting abdominal pain.  Last bowel movement 10/6.  Physical Exam: Vitals:   04/17/22 1953 04/18/22 0125 04/18/22 0354 04/18/22 0744  BP: 127/69 (!) 142/65 137/65 (!) 148/68  Pulse: 74 (!) 55 71 78  Resp: 17 16 17 17   Temp: 97.6 F (36.4 C) 98.7 F (37.1 C) 98.7 F (37.1 C) 98.8 F (37.1 C)  TempSrc: Oral Oral Oral   SpO2: 99% 93% 100% 95%  Weight:      Height:       General exam: Appears calm and comfortable  Respiratory system: Decreased breathing sounds with mild crackles in the base. Respiratory effort normal. Cardiovascular system: S1 & S2 heard,  RRR. No JVD, murmurs, rubs, gallops or clicks. No pedal edema. Gastrointestinal system: Abdomen is nondistended, soft and nontender. No organomegaly or masses felt. Normal bowel sounds heard. Central nervous system: Alert and oriented x3. No  focal neurological deficits. Extremities: Symmetric 5 x 5 power. Skin: No rashes, lesions or ulcers Psychiatry: Mood & affect appropriate.   Data Reviewed:  Lab results reviewed.  Family Communication:   Disposition: Status is: Inpatient Remains inpatient appropriate because: Severity of disease, still symptomatic with COVID infection.  Planned Discharge Destination: Home with Home Health    Time spent: 55 minutes  Author: Sharen Hones, MD 04/18/2022 9:48 AM  For on call review [email or web address].

## 2022-04-18 NOTE — Progress Notes (Signed)
Physical Therapy Treatment Patient Details Name: Jean Johnson MRN: [id number] DOB: 1950-11-17 Today's Date: 04/18/2022   History of Present Illness Jean Johnson is a 71 y.o. 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    PT Comments    Pt resting in bed upon PT arrival; agreeable to therapy.  During session pt SBA semi-supine to sitting edge of bed; min assist with transfers using RW; and min assist to ambulate 16 feet with RW.  Limited distance ambulating d/t fatigue and SOB (O2 sats 91% or greater on room air during sessions activities).  Pt more unsteady and appearing weaker compared to initial evaluation; d/t this PT recommendations updated to SNF (discussed with pt).  Will continue to focus on strengthening, balance, and progressive functional mobility per pt tolerance.    Recommendations for follow up therapy are one component of a multi-disciplinary discharge planning process, led by the attending physician.  Recommendations may be updated based on patient status, additional functional criteria and insurance authorization.  Follow Up Recommendations  Skilled nursing-short term rehab (<3 hours/day) Can patient physically be transported by private vehicle: Yes   Assistance Recommended at Discharge Frequent or constant Supervision/Assistance  Patient can return home with the following A little help with walking and/or transfers;A little help with bathing/dressing/bathroom;Assistance with cooking/housework;Assist for transportation;Help with stairs or ramp for entrance   Equipment Recommendations  Other (comment) (TBD at next facility)    Recommendations for Other Services       Precautions / Restrictions Precautions Precautions:  Fall Restrictions Weight Bearing Restrictions: No     Mobility  Bed Mobility Overal bed mobility: Needs Assistance Bed Mobility: Supine to Sit     Supine to sit: Supervision     General bed mobility comments: increased effort to perform on own    Transfers Overall transfer level: Needs assistance Equipment used: Rolling walker (2 wheels) Transfers: Sit to/from Stand Sit to Stand: Min assist   Step pivot transfers: Min assist       General transfer comment: assist to steady; x1 trial from bed, x2 trials from recliner; stand step turn recliner to/from [facility] (no AD use)    Ambulation/Gait Ambulation/Gait assistance: Min assist Gait Distance (Feet): 16 Feet Assistive device: Rolling walker (2 wheels)   Gait velocity: decreased     General Gait Details: decreased stance time L LE; flexed posture; L knee flexion during L LE stance phase; assist to steady; occasional scissoring with turns   Marine scientist Rank[place] (Stroke Patients Only)       Balance Overall balance assessment: Needs assistance Sitting-balance support: No upper extremity supported, Feet supported Sitting balance-Leahy Scale: Good Sitting balance - Comments: steady sitting reaching within BOS   Standing balance support: Bilateral upper extremity supported, During functional activity Standing balance-Leahy Scale: Poor Standing balance comment: assist to steady with mobility using RW                            Cognition Arousal/Alertness: Awake/alert Behavior During Therapy: WFL for tasks assessed/performed Overall Cognitive Status: Within Functional Limits for tasks assessed  Exercises      General Comments  Nursing cleared pt for participation in physical therapy.  Pt agreeable to PT session.      Pertinent Vitals/Pain Pain Assessment Pain Assessment: No/denies pain    Home  Living                          Prior Function            PT Goals (current goals can now be found in the care plan section) Acute Rehab PT Goals Patient Stated Goal: to return home PT Goal Formulation: With patient Time For Goal Achievement: 04/29/22 Potential to Achieve Goals: Fair Progress towards PT goals: Progressing toward goals    Frequency    Min 2X/week      PT Plan Discharge plan needs to be updated    Co-evaluation              AM-PAC PT "6 Clicks" Mobility   Outcome Measure  Help needed turning from your back to your side while in a flat bed without using bedrails?: None Help needed moving from lying on your back to sitting on the side of a flat bed without using bedrails?: A Little Help needed moving to and from a bed to a chair (including a wheelchair)?: A Little Help needed standing up from a chair using your arms (e.g., wheelchair or bedside chair)?: A Little Help needed to walk in hospital room?: A Little Help needed climbing 3-5 steps with a railing? : A Lot 6 Click Score: 18    End of Session Equipment Utilized During Treatment: Gait belt Activity Tolerance: Patient limited by fatigue Patient left: in chair;with call bell/phone within reach;Other (comment) (with OT present) Nurse Communication: Mobility status;Precautions PT Visit Diagnosis: Difficulty in walking, not elsewhere classified (R26.2);Muscle weakness (generalized) (M62.81)     Time: 7903-8333 PT Time Calculation (min) (ACUTE ONLY): 38 min  Charges:  $Gait Training: 8-22 mins $Therapeutic Activity: 23-37 mins                     Leitha Bleak, PT 04/18/22, 4:53 PM

## 2022-04-18 NOTE — Progress Notes (Signed)
Speech Language Pathology Treatment: Dysphagia  Patient Details Name: Jean Johnson MRN: [id number] DOB: 1950-11-06 Today's Date: 04/18/2022 Time: 1330-1410 SLP Time Calculation (min) (ACUTE ONLY): 40 min  Assessment / Plan / Recommendation Clinical Impression  Pt seen for ongoing assessment of swallowing and toleration of oral diet. She was awake, verbally engaged in conversation w/ SLP. Pt on RA; wbc wnl.   Pt explained general aspiration precautions and agreed verbally to the need for following them especially sitting upright for all oral intake -- supported her behind the back for full upright sitting. She fed herself purees and soft solids and thin liquids via straw. Though, she was not inclined to eat a lunch meal stating not much appetite. No overt clinical s/s of aspiration were noted w/ any consistency; No coughing, respiratory status remained calm and unlabored, vocal quality clear b/t trials. Oral phase appeared grossly [facility] for bolus management and timely A-P transfer for swallowing; oral clearing achieved w/ all consistencies. NSG denied any deficits in swallowing as well.   Pt appears at reduced risk for aspriation when following general aspiration precautions. Recommend continue a Regular/mech soft diet for ease of soft foods w/ gravies added to moisten foods; Thin liquids. Recommend general aspiration precautions including sitting upright for all oral intake; Pills Whole in Puree IF needed for ease of swallowing; tray setup and positioning assistance for meals.  ST services will sign off at this time w/ MD to reconsult if needed while admitted. NSG updated. Precautions posted at bedside.       HPI HPI: Per H&P: "Jean Johnson is a 71 y.o. female with medical history significant of former ESRD status post renal transplantation on immunosuppressants, hypertension, hyperlipidemia, diabetes mellitus, atrial fibrillation on Eliquis, CKD stage IIIa, recent COVID infection 9/25,  who presents with dizziness, weakness and syncope. Pt was diagnosed with COVID on 04/04/2022. Pt was treated with a course of doxycycline and prednisone by PCP. She states that she has chronic dizziness. She was seen by ENT and ophthalmology with negative work-up. She had MRI of brain on  08/06/21 which was negative for stroke. Her neurologist is referring her to [facility] for further work-up of dizziness. She continues to have dizziness and lightheadedness. She endorses sensation of feeling lightheaded as well as room spinning.  No unilateral numbness in extremities.  No facial droop or slurred speech.  Patient has mild dry cough, denies chest pain, shortness breath, fever or chills.  Patient had nausea and vomited twice yesterday which has resolved.  Currently no nausea vomiting, diarrhea or abdominal pain.  Denies symptoms of UTI.  Per EDP, patient had syncopal episode while in triage and was rushed back to treatment room."  CXR 04/17/22: "Increasing opacity and volume loss within the mid and lower LEFT lung with possible superimposed LEFT pleural effusion. This likely represents a combination of increasing airspace disease and atelectasis." CT Head 04/14/22: "No acute intracranial abnormalities. The appearance of the brain is normal for age." Pt is currently on room air and regular solids and thin liquids diet. RN reporting pt with congest cough and limited appetite, but no acute issue with intake.      SLP Plan  All goals met      Recommendations for follow up therapy are one component of a multi-disciplinary discharge planning process, led by the attending physician.  Recommendations may be updated based on patient status, additional functional criteria and insurance authorization.    Recommendations  Diet recommendations: Regular;Thin liquid Liquids provided  via: Cup;Straw Medication Administration: Whole meds with liquid Supervision: Patient able to self feed Compensations: Minimize environmental  distractions;Slow rate;Small sips/bites Postural Changes and/or Swallow Maneuvers: Out of bed for meals;Seated upright 90 degrees;Upright 30-60 min after meal                General recommendations:  (Dietician f/u) Oral Care Recommendations: Oral care BID;Oral care before and after PO;Patient independent with oral care Follow Up Recommendations: No SLP follow up Assistance recommended at discharge: PRN (for setup) SLP Visit Diagnosis: Dysphagia, unspecified (R13.10) Plan: All goals met             Jean Johnson, [place], [place]; [place] [phone] (ascom) Jean Johnson  04/18/2022, 3:28 PM

## 2022-04-18 NOTE — Plan of Care (Signed)
  Problem: Education: Goal: Ability to describe self-care measures that may prevent or decrease complications (Diabetes Survival Skills Education) will improve Outcome: Progressing Goal: Individualized Educational Video(s) Outcome: Progressing   Problem: Coping: Goal: Ability to adjust to condition or change in health will improve Outcome: Progressing   Problem: Fluid Volume: Goal: Ability to maintain a balanced intake and output will improve Outcome: Progressing   Problem: Health Behavior/Discharge Planning: Goal: Ability to identify and utilize available resources and services will improve Outcome: Progressing Goal: Ability to manage health-related needs will improve Outcome: Progressing   Problem: Metabolic: Goal: Ability to maintain appropriate glucose levels will improve Outcome: Progressing   Problem: Nutritional: Goal: Maintenance of adequate nutrition will improve Outcome: Progressing Goal: Progress toward achieving an optimal weight will improve Outcome: Progressing   Problem: Skin Integrity: Goal: Risk for impaired skin integrity will decrease Outcome: Progressing   Problem: Tissue Perfusion: Goal: Adequacy of tissue perfusion will improve Outcome: Progressing   Problem: Education: Goal: Knowledge of General Education information will improve Description: Including pain rating scale, medication(s)/side effects and non-pharmacologic comfort measures Outcome: Progressing   Problem: Health Behavior/Discharge Planning: Goal: Ability to manage health-related needs will improve Outcome: Progressing   Problem: Clinical Measurements: Goal: Ability to maintain clinical measurements within normal limits will improve Outcome: Progressing Goal: Will remain free from infection Outcome: Progressing Goal: Diagnostic test results will improve Outcome: Progressing Goal: Respiratory complications will improve Outcome: Progressing Goal: Cardiovascular complication will  be avoided Outcome: Progressing   Problem: Activity: Goal: Risk for activity intolerance will decrease Outcome: Progressing   Problem: Nutrition: Goal: Adequate nutrition will be maintained Outcome: Progressing   Problem: Coping: Goal: Level of anxiety will decrease Outcome: Progressing   Problem: Elimination: Goal: Will not experience complications related to bowel motility Outcome: Progressing Goal: Will not experience complications related to urinary retention Outcome: Progressing   Problem: Pain Managment: Goal: General experience of comfort will improve Outcome: Progressing   Problem: Safety: Goal: Ability to remain free from injury will improve Outcome: Progressing   Problem: Skin Integrity: Goal: Risk for impaired skin integrity will decrease Outcome: Progressing   Problem: Education: Goal: Knowledge of risk factors and measures for prevention of condition will improve Outcome: Progressing   Problem: Coping: Goal: Psychosocial and spiritual needs will be supported Outcome: Progressing   Problem: Respiratory: Goal: Will maintain a patent airway Outcome: Progressing Goal: Complications related to the disease process, condition or treatment will be avoided or minimized Outcome: Progressing   

## 2022-04-18 NOTE — Progress Notes (Signed)
       CROSS COVER NOTE  NAME: Jean Johnson MRN: [id number] DOB : 1950/11/19    Date of Service   04/18/2022   HPI/Events of Note   Notified of bladder scan volume of 656mL and PVR of 542mL.  Interventions   Assessment/Plan:  Acute Urinary Retention In and Out Cath      This document was prepared using Dragon voice recognition software and may include unintentional dictation errors.  Neomia Glass DNP, MBA, FNP-BC Nurse Practitioner [facility] Pager [phone]

## 2022-04-19 DIAGNOSIS — N1831 Chronic kidney disease, stage 3a: Secondary | ICD-10-CM | POA: Diagnosis not present

## 2022-04-19 DIAGNOSIS — I5033 Acute on chronic diastolic (congestive) heart failure: Secondary | ICD-10-CM | POA: Diagnosis not present

## 2022-04-19 DIAGNOSIS — N179 Acute kidney failure, unspecified: Secondary | ICD-10-CM | POA: Diagnosis not present

## 2022-04-19 DIAGNOSIS — R55 Syncope and collapse: Secondary | ICD-10-CM | POA: Diagnosis not present

## 2022-04-19 LAB — CULTURE, BLOOD (ROUTINE X 2)
Culture: NO GROWTH
Culture: NO GROWTH
Specimen Description: ADEQUATE
Specimen Description: ADEQUATE

## 2022-04-19 LAB — GLUCOSE, CAPILLARY
Glucose-Capillary: 140 mg/dL — ABNORMAL HIGH (ref 70–99)
Glucose-Capillary: 185 mg/dL — ABNORMAL HIGH (ref 70–99)

## 2022-04-19 MED ORDER — IPRATROPIUM-ALBUTEROL 20-100 MCG/ACT IN AERS: 1.0000 | INHALATION_SPRAY | Freq: Four times a day (QID) | RESPIRATORY_TRACT | 0 refills | Status: DC

## 2022-04-19 MED ORDER — GUAIFENESIN ER 600 MG PO TB12: 1200.0000 mg | ORAL_TABLET | Freq: Two times a day (BID) | ORAL | 0 refills | Status: DC

## 2022-04-19 MED ORDER — TAMSULOSIN HCL 0.4 MG PO CAPS: 0.4000 mg | ORAL_CAPSULE | Freq: Every day | ORAL | 0 refills | Status: AC

## 2022-04-19 MED ORDER — PREDNISONE 20 MG PO TABS: 40.0000 mg | ORAL_TABLET | Freq: Every day | ORAL | 0 refills | Status: AC

## 2022-04-19 NOTE — TOC Progression Note (Signed)
Transition of Care [facility]) - Progression Note    Patient Details  Name: Jean Johnson MRN: [id number] Date of Birth: 1951/06/07  [facility]) CM/SW Contact  Laurena Slimmer, RN Phone Number: 04/19/2022, 12:06 PM  Clinical Narrative:    Spoke with patient's regarding discharge, and therapy recommendations. Patient is agreeable to SNF. She would prefer [facility]. She was informed about the quarrentine period for SNF.         Expected Discharge Plan and Services                                                 Social Determinants of Health (SDOH) Interventions    Readmission Risk Interventions     No data to display

## 2022-04-19 NOTE — TOC Transition Note (Signed)
Transition of Care [facility]) - CM/SW Discharge Note   Patient Details  Name: Jean Johnson MRN: [id number] Date of Birth: 1950/12/06  Transition of Care [facility]) CM/SW Contact:  Laurena Slimmer, RN Phone Number: 04/19/2022, 1:23 PM   Clinical Narrative:    Spoke with patient. She changed her mind about going to SNF. She would prefer to go home. Her husband will transport her home. Referral for [facility] PT/ [facility]/ RN HHA sent and accepted by Corene Cornea at [facility]. TOC signing off.          Patient Goals and CMS Choice        Discharge Placement                       Discharge Plan and Services                                     Social Determinants of Health (SDOH) Interventions     Readmission Risk Interventions     No data to display

## 2022-04-19 NOTE — Discharge Summary (Addendum)
Physician Discharge Summary   Patient: Jean Johnson MRN: [id number] DOB: 1950/11/24  Admit date:     04/14/2022  Discharge date: 04/19/22  Discharge Physician: Sharen Hones   PCP: Gladstone Lighter, MD   Recommendations at discharge:   Follow-up with PCP in 1 week. Follow-up with nephrology in 2 weeks.  Discharge Diagnoses: Principal Problem:   Syncope Active Problems:   Acute renal failure superimposed on stage 3a chronic kidney disease (HCC)   Atrial fibrillation, chronic (HCC)   Acute bronchitis due to COVID-19 virus   Essential hypertension   HLD (hyperlipidemia)   Hypokalemia   Kidney transplant status   Myocardial injury   Type II diabetes mellitus with renal manifestations (HCC)   Acute on chronic diastolic CHF (congestive heart failure) (HCC)   Metabolic acidosis   Acute urinary retention  Resolved Problems:   * No resolved hospital problems. *  Hospital Course: Jean Johnson is a 71 y.o. 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 Patient condition appears to be secondary to dehydration, with acute renal failure on chronic kidney disease stage IIIa.  Was given IV fluids.  10/7.  Renal functions better today, patient developed mild short of breath, give 40 mg oral torsemide.  10/8.  Had a fever of 100.6, chest x-ray repeated, still left lower lobe infiltrates.  Procalcitonin level less than 0.1, repeat tomorrow. Obtain speech therapy evaluation.  10/9.  Still has significant productive cough, serial procalcitonin level less than 0.1.  Started prednisone 40 mg daily for acute bronchitis secondary to COVID.  Assessment and Plan: Syncope Secondary to dehydration.  No recurrence.   Acute on chronic diastolic  congestive heart failure. Patient had a short of breath 2 days ago, chest x-ray showed mild congestive heart failure changes.  She received 1 dose of torsemide.  Shortness of breath much improved afterwards.   Acute renal failure superimposed on stage 3a chronic kidney disease ([place]) History of renal transplant. Metabolic acidosis. Renal function has back to baseline.   Patient also had mild metabolic acidosis at the time of admission, that has improved with improvement of renal function. Tacrolimus level 5.8, within therapeutic range. Due to acute renal failure, lisinopril and diuretics will be on hold until seen by neurology.   Atrial fibrillation, chronic ([place]) Continue current treatment.   Acute bronchitis due to COVID-19 infection. Patient had persistent positive COVID due to immunocompromise status.  No need for treatment at this time. Patient has some left lower lobe infiltrates, but serial procalcitonin level less than 0.1, no bacterial infection. Patient also been seen by speech therapy, mild aspiration risk. Patient still have significant cough, mild short of breath, consistent with acute bronchitis caused by COVID.  Increase prednisone to 40 mg daily, will continue for 4 more days.  Condition improved.   Acute urinary retention. No evidence of urinary tract infection.  Required straight cath x2.  Start Flomax, patient was able to urinate last night.  We will continue 3 days of Flomax.    Hypomagnesemia. Repleted.   HLD (hyperlipidemia) Zetia, Lipitor   Essential hypertension Continue current treatment.   Hypokalemia Condition improved.   Myocardial injury Due to dehydration and near syncope.   Type II diabetes mellitus with renal manifestations (HCC) Glucose running high, resume home regimen.  Generalized weakness  Patient was seen by  PT/OT, recommended nursing placement.  I discussed with patient and her husband, they refuse nursing home placement.  They prefer  home with home health.     Consultants: Nephrology Procedures performed: None  Disposition: Home health Diet recommendation:  Discharge Diet Orders (From admission, onward)     Start     Ordered   04/19/22 0000  Diet - low sodium heart healthy        04/19/22 1308           Cardiac diet DISCHARGE MEDICATION: Allergies as of 04/19/2022       Reactions   E-mycin [erythromycin] Swelling   And itching   Penicillins Swelling   Throat        Medication List     STOP taking these medications    furosemide 20 MG tablet Commonly known as: LASIX   lisinopril 30 MG tablet Commonly known as: ZESTRIL   Potassium Chloride ER 20 MEQ Tbcr   senna 8.6 MG Tabs tablet Commonly known as: SENOKOT       TAKE these medications    apixaban 5 MG Tabs tablet Commonly known as: ELIQUIS Take 5 mg by mouth 2 (two) times daily.   atorvastatin 10 MG tablet Commonly known as: LIPITOR Take 10 mg by mouth daily.   cyanocobalamin 2000 MCG tablet Take 2,000 mcg by mouth daily.   diltiazem 240 MG 24 hr capsule Commonly known as: CARDIZEM CD Take 240 mg by mouth at bedtime.   DRY EYES OP Place 1 drop into both eyes daily as needed (blurry Vision or floater in eyes).   ezetimibe 10 MG tablet Commonly known as: ZETIA Take 10 mg by mouth daily.   flecainide 100 MG tablet Commonly known as: TAMBOCOR Take 100 mg by mouth every 12 (twelve) hours.   guaiFENesin 600 MG 12 hr tablet Commonly known as: MUCINEX Take 2 tablets (1,200 mg total) by mouth 2 (two) times daily for 7 days.   insulin detemir 100 UNIT/ML injection Commonly known as: LEVEMIR Inject 8 Units into the skin at bedtime.   insulin lispro 100 UNIT/ML KwikPen Commonly known as: HUMALOG Inject 0-5 Units into the skin in the morning and at bedtime. Sliding scale   Ipratropium-Albuterol 20-100 MCG/ACT Aers respimat Commonly known as: COMBIVENT Inhale 1 puff into the lungs every 6 (six) hours for 10 days.    meclizine 12.5 MG tablet Commonly known as: ANTIVERT Take 12.5 mg by mouth 3 (three) times daily as needed for dizziness.   mycophenolate 500 MG tablet Commonly known as: CELLCEPT Take 500 mg by mouth 2 (two) times daily.   predniSONE 5 MG tablet Commonly known as: DELTASONE Take 5 mg by mouth daily with breakfast. What changed: Another medication with the same name was added. Make sure you understand how and when to take each.   predniSONE 20 MG tablet Commonly known as: DELTASONE Take 2 tablets (40 mg total) by mouth daily with breakfast for 4 days. Start taking on: April 20, 2022 What changed: You were already taking a medication with the same name, and this prescription was added. Make sure you understand how and when to take each.   pregabalin 25 MG capsule Commonly known as: LYRICA Take 50 mg by mouth 2 (two) times daily.   tacrolimus 1 MG capsule Commonly known as: PROGRAF Take 3 mg by mouth 2 (two) times daily.   tamsulosin 0.4 MG Caps capsule Commonly known as: FLOMAX Take 1 capsule (0.4 mg total) by mouth daily after  supper for 3 days.        Follow-up Information     Gladstone Lighter, MD Follow up in 1 week(s).   Specialty: Internal Medicine Contact information: [place] [address] [phone]         Murlean Iba, MD Follow up in 2 week(s).   Specialty: Nephrology Contact information: [place] [address] [phone]                Discharge Exam: Danley Danker Weights   04/14/22 0409  Weight: 50.8 kg   General exam: Appears calm and comfortable, frail Respiratory system: Clear to auscultation. Respiratory effort normal. Cardiovascular system: S1 & S2 heard, RRR. No JVD, murmurs, rubs, gallops or clicks. No pedal edema. Gastrointestinal system: Abdomen is nondistended, soft and nontender. No organomegaly or masses felt. Normal bowel sounds heard. Central nervous system: Alert  and oriented x3. No focal neurological deficits. Extremities: Symmetric 5 x 5 power. Skin: No rashes, lesions or ulcers Psychiatry: Judgement and insight appear normal. Mood & affect appropriate.    Condition at discharge: good  The results of significant diagnostics from this hospitalization (including imaging, microbiology, ancillary and laboratory) are listed below for reference.   Imaging Studies: DG Chest Port 1 View  Result Date: 04/17/2022 CLINICAL DATA:  Pneumonia. EXAM: PORTABLE CHEST 1 VIEW COMPARISON:  04/14/2022 FINDINGS: Increasing opacity and volume loss within the mid and lower LEFT lung noted with possible superimposed LEFT pleural effusion. Mild RIGHT lung interstitial opacities again noted. There is no evidence of pneumothorax. No acute bony abnormalities are identified. IMPRESSION: Increasing opacity and volume loss within the mid and lower LEFT lung with possible superimposed LEFT pleural effusion. This likely represents a combination of increasing airspace disease and atelectasis. Electronically Signed   By: Margarette Canada M.D.   On: 04/17/2022 10:31   US RENAL  Result Date: 04/14/2022 CLINICAL DATA:  Acute kidney injury, history of renal transplant. EXAM: RENAL / URINARY TRACT ULTRASOUND COMPLETE COMPARISON:  January 08, 2021. FINDINGS: Right Kidney: Renal measurements: 7.5 x 3.5 x 3.4 cm = volume: 46 mL. Increased echogenicity of renal parenchyma is noted suggesting medical renal disease. 1.9 cm simple cyst is noted in upper pole. No mass or hydronephrosis visualized. Left lower quadrant renal transplant: Renal measurements: 10.0 x 6.0 x 4.4 cm = volume: 139 mL. Mildly increased echogenicity of renal parenchyma is noted. No mass or hydronephrosis visualized. Bladder: Appears normal for degree of bladder distention. Other: None. IMPRESSION: Renal transplant is noted in left lower quadrant. No hydronephrosis or renal obstruction is noted. Increased echogenicity of of the renal  parenchyma of the left renal transplant is noted suggesting medical renal disease. Atrophy of right kidney is noted consistent with end-stage renal disease. Electronically Signed   By: Marijo Conception M.D.   On: 04/14/2022 09:10   DG Chest Port 1 View  Result Date: 04/14/2022 CLINICAL DATA:  71 year old female with history of dizziness. Recent history of COVID infection. EXAM: PORTABLE CHEST 1 VIEW COMPARISON:  No priors. FINDINGS: Lung volumes are normal. Opacity at the left base which may reflect atelectasis and/or consolidation, with superimposed small left pleural effusion. Right lung is clear. No right pleural effusion. Skin fold artifact projecting over the right hemithorax incidentally noted. No pneumothorax. Cephalization of the pulmonary vasculature with indistinctness of the interstitial markings. Heart size is mildly enlarged. Upper mediastinal contours are within normal limits. Atherosclerotic calcifications in the thoracic aorta. Orthopedic fixation hardware in the lower  cervical spine incidentally noted. IMPRESSION: 1. The appearance of the chest suggests mild congestive heart failure, as above. 2. There is also a more focal area of volume loss and potential airspace consolidation in the left lower lobe which could reflect developing infection. 3. Small left pleural effusion. 4. Aortic atherosclerosis. Electronically Signed   By: Vinnie Langton M.D.   On: 04/14/2022 05:52   CT Head Wo Contrast  Result Date: 04/14/2022 CLINICAL DATA:  71 year old female with history of altered mental status. EXAM: CT HEAD WITHOUT CONTRAST TECHNIQUE: Contiguous axial images were obtained from the base of the skull through the vertex without intravenous contrast. RADIATION DOSE REDUCTION: This exam was performed according to the departmental dose-optimization program which includes automated exposure control, adjustment of the mA and/or kV according to patient size and/or use of iterative reconstruction  technique. COMPARISON:  No priors. FINDINGS: Brain: No evidence of acute infarction, hemorrhage, hydrocephalus, extra-axial collection or mass lesion/mass effect. Vascular: No hyperdense vessel or unexpected calcification. Skull: Normal. Negative for fracture or focal lesion. Sinuses/Orbits: No acute finding. Other: None. IMPRESSION: 1. No acute intracranial abnormalities. The appearance of the brain is normal for age. Electronically Signed   By: Vinnie Langton M.D.   On: 04/14/2022 05:16    Microbiology: Results for orders placed or performed during the hospital encounter of 04/14/22  Resp Panel by RT-PCR (Flu A&B, Covid) Anterior Nasal Swab     Status: Abnormal   Collection Time: 04/14/22  4:53 AM   Specimen: Anterior Nasal Swab  Result Value Ref Range Status   SARS Coronavirus 2 by RT PCR POSITIVE (A) NEGATIVE Final    Comment: (NOTE) SARS-CoV-2 target nucleic acids are DETECTED.  The SARS-CoV-2 RNA is generally detectable in upper respiratory specimens during the acute phase of infection. Positive results are indicative of the presence of the identified virus, but do not rule out bacterial infection or co-infection with other pathogens not detected by the test. Clinical correlation with patient history and other diagnostic information is necessary to determine patient infection status. The expected result is Negative.  Fact Sheet for Patients: EntrepreneurPulse.com.au  Fact Sheet for Healthcare Providers: IncredibleEmployment.be  This test is not yet approved or cleared by the [place] FDA and  has been authorized for detection and/or diagnosis of SARS-CoV-2 by FDA under an Emergency Use Authorization (EUA).  This EUA will remain in effect (meaning this test can be used) for the duration of  the COVID-19 declaration under Section 564(b)(1) of the A ct, 21 U.S.C. section 360bbb-3(b)(1), unless the authorization is terminated or revoked  sooner.     Influenza A by PCR NEGATIVE NEGATIVE Final   Influenza B by PCR NEGATIVE NEGATIVE Final    Comment: (NOTE) The Xpert Xpress SARS-CoV-2/FLU/RSV plus assay is intended as an aid in the diagnosis of influenza from Nasopharyngeal swab specimens and should not be used as a sole basis for treatment. Nasal washings and aspirates are unacceptable for Xpert Xpress SARS-CoV-2/FLU/RSV testing.  Fact Sheet for Patients: EntrepreneurPulse.com.au  Fact Sheet for Healthcare Providers: IncredibleEmployment.be  This test is not yet approved or cleared by the [place] FDA and has been authorized for detection and/or diagnosis of SARS-CoV-2 by FDA under an Emergency Use Authorization (EUA). This EUA will remain in effect (meaning this test can be used) for the duration of the COVID-19 declaration under Section 564(b)(1) of the Act, 21 U.S.C. section 360bbb-3(b)(1), unless the authorization is terminated or revoked.  Performed at [facility], [address],  [place], [place] [address]   Culture, blood (routine x 2)     Status: None   Collection Time: 04/14/22  8:32 AM   Specimen: BLOOD  Result Value Ref Range Status   Specimen Description BLOOD Blood Culture adequate volume  Final   Special Requests BLOOD RIGHT FOREARM  Final   Culture   Final    NO GROWTH 5 DAYS Performed at [facility], [address] [place], [place] [address]    Report Status 04/19/2022 FINAL  Final  Culture, blood (routine x 2)     Status: None   Collection Time: 04/14/22  8:32 AM   Specimen: BLOOD  Result Value Ref Range Status   Specimen Description BLOOD Blood Culture adequate volume  Final   Special Requests BLOOD RIGHT HAND  Final   Culture   Final    NO GROWTH 5 DAYS Performed at [facility], [address] [place], [place] [address]    Report Status 04/19/2022 FINAL  Final    Labs: CBC: Recent Labs  Lab  04/14/22 0414 04/16/22 0555 04/17/22 0548  WBC 4.8 7.4 7.7  NEUTROABS 2.5  --   --   HGB 12.2 10.8* 11.8*  HCT 39.6 34.4* 36.3  MCV 97.3 96.9 94.8  PLT 180 177 503   Basic Metabolic Panel: Recent Labs  Lab 04/14/22 0414 04/15/22 0605 04/16/22 0555 04/17/22 0548 04/18/22 0406  NA 143 142 143 141 141  K 3.4* 4.7 4.1 3.9 3.9  CL 113* 117* 118* 107 110  CO2 20* 21* 22 24 26   GLUCOSE 67* 298* 111* 96 79  BUN 36* 44* 43* 39* 36*  CREATININE 2.52* 2.27* 1.87* 1.94* 1.69*  CALCIUM 8.1* 8.2* 8.8* 9.7 8.6*  MG 2.0  --  2.0  --  1.6*   Liver Function Tests: Recent Labs  Lab 04/14/22 0436  AST 21  ALT 9  ALKPHOS 55  BILITOT 0.4  PROT 5.8*  ALBUMIN 2.9*   CBG: Recent Labs  Lab 04/18/22 1608 04/18/22 1938 04/18/22 2157 04/19/22 0730 04/19/22 1132  GLUCAP 183* 218* 162* 140* 185*    Discharge time spent: greater than 30 minutes.  Signed: Sharen Hones, MD [facility] 04/19/2022

## 2022-04-19 NOTE — Progress Notes (Signed)
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  Patient presents to the emergency department with complaints of weakness, dizziness, nausea, and vomiting.  Patient states she was recently diagnosed with COVID-19.  She has been admitted under observation for Dehydration [E86.0] Dizziness [R42] Syncope [R55] Hypoglycemia [E16.2] Generalized weakness [R53.1] AKI (acute kidney injury) ([place]) [N17.9] Syncope, unspecified syncope type [R55] COVID-19 [U07.1] Acute renal failure superimposed on stage 3a chronic kidney disease ([place]) [N17.9, N18.31]  Patient is known to our practice and receives outpatient follow-up with Dr. Lanora Manis.  She was last seen in office in August.    Patient sitting up in bed Cough remains present, small sputum production  Remains afebrile Creatinine 1.69   Objective:  Vital signs in last 24 hours:  Temp:  [97.7 F (36.5 C)-97.9 F (36.6 C)] 97.9 F (36.6 C) (10/10 0727) Pulse Rate:  [64-75] 66 (10/10 0727) Resp:  [16-18] 16 (10/10 0727) BP: (115-130)/(62-70) 115/62 (10/10 0727) SpO2:  [96 %-100 %] 100 % (10/10 0727)  Weight change:  Filed Weights   04/14/22 0409  Weight: 50.8 kg    Intake/Output: I/O last 3 completed shifts: In: 913.8 [I.V.:863.8; IV Piggyback:50] Out: 1932 [Urine:1782; Emesis/NG output:150]   Intake/Output this shift:  No intake/output data recorded.  Physical Exam: General: NAD  Head: Normocephalic, atraumatic. Moist oral mucosal membranes  Eyes: Anicteric  Lungs:  Clear to auscultation, normal effort, room air  Heart: Regular rate and rhythm  Abdomen:  Soft, nontender, nondistended  Extremities: No peripheral edema.  Neurologic: Nonfocal, moving all four extremities  Skin: No lesions   Access: Left aVF    Basic Metabolic Panel: Recent Labs  Lab 04/14/22 0414 04/15/22 0605 04/16/22 0555 04/17/22 0548 04/18/22 0406  NA 143 142 143 141 141  K 3.4* 4.7 4.1 3.9 3.9  CL 113* 117* 118* 107 110  CO2 20* 21* 22 24 26   GLUCOSE 67* 298* 111* 96 79  BUN 36* 44* 43* 39* 36*  CREATININE 2.52* 2.27* 1.87* 1.94* 1.69*  CALCIUM 8.1* 8.2* 8.8* 9.7 8.6*  MG 2.0  --  2.0  --  1.6*     Liver Function Tests: Recent Labs  Lab 04/14/22 0436  AST 21  ALT 9  ALKPHOS 55  BILITOT 0.4  PROT 5.8*  ALBUMIN 2.9*    Recent Labs  Lab 04/14/22 0436  LIPASE 36    No results for input(s): "AMMONIA" in the last 168 hours.  CBC: Recent Labs  Lab 04/14/22 0414 04/16/22 0555 04/17/22 0548  WBC 4.8 7.4 7.7  NEUTROABS 2.5  --   --   HGB 12.2 10.8* 11.8*  HCT 39.6 34.4* 36.3  MCV 97.3 96.9 94.8  PLT 180 177 164     Cardiac Enzymes: No results for input(s): "CKTOTAL", "CKMB", "CKMBINDEX", "TROPONINI" in the last 168 hours.  BNP: Invalid input(s): "POCBNP"  CBG: Recent Labs  Lab 04/18/22 1126 04/18/22 1608 04/18/22 1938 04/18/22 2157 04/19/22 0730  GLUCAP 100* 183* 218* 162* 140*     Microbiology: Results for orders placed or performed during the hospital encounter of 04/14/22  Resp Panel by RT-PCR (Flu A&B, Covid) Anterior Nasal Swab     Status: Abnormal   Collection Time: 04/14/22  4:53 AM   Specimen: Anterior Nasal Swab  Result Value Ref Range Status   SARS Coronavirus  2 by RT PCR POSITIVE (A) NEGATIVE Final    Comment: (NOTE) SARS-CoV-2 target nucleic acids are DETECTED.  The SARS-CoV-2 RNA is generally detectable in upper respiratory specimens during the acute phase of infection. Positive results are indicative of the presence of the identified virus, but do not rule out bacterial infection or co-infection with other pathogens not detected by the test. Clinical correlation with patient history and other diagnostic information is necessary to  determine patient infection status. The expected result is Negative.  Fact Sheet for Patients: EntrepreneurPulse.com.au  Fact Sheet for Healthcare Providers: IncredibleEmployment.be  This test is not yet approved or cleared by the [place] FDA and  has been authorized for detection and/or diagnosis of SARS-CoV-2 by FDA under an Emergency Use Authorization (EUA).  This EUA will remain in effect (meaning this test can be used) for the duration of  the COVID-19 declaration under Section 564(b)(1) of the A ct, 21 U.S.C. section 360bbb-3(b)(1), unless the authorization is terminated or revoked sooner.     Influenza A by PCR NEGATIVE NEGATIVE Final   Influenza B by PCR NEGATIVE NEGATIVE Final    Comment: (NOTE) The Xpert Xpress SARS-CoV-2/FLU/RSV plus assay is intended as an aid in the diagnosis of influenza from Nasopharyngeal swab specimens and should not be used as a sole basis for treatment. Nasal washings and aspirates are unacceptable for Xpert Xpress SARS-CoV-2/FLU/RSV testing.  Fact Sheet for Patients: EntrepreneurPulse.com.au  Fact Sheet for Healthcare Providers: IncredibleEmployment.be  This test is not yet approved or cleared by the [place] FDA and has been authorized for detection and/or diagnosis of SARS-CoV-2 by FDA under an Emergency Use Authorization (EUA). This EUA will remain in effect (meaning this test can be used) for the duration of the COVID-19 declaration under Section 564(b)(1) of the Act, 21 U.S.C. section 360bbb-3(b)(1), unless the authorization is terminated or revoked.  Performed at [facility], [address] [place], [place] [address]   Culture, blood (routine x 2)     Status: None   Collection Time: 04/14/22  8:32 AM   Specimen: BLOOD  Result Value Ref Range Status   Specimen Description BLOOD Blood Culture adequate volume  Final   Special Requests  BLOOD RIGHT FOREARM  Final   Culture   Final    NO GROWTH 5 DAYS Performed at [facility], [address], [place], [place] [address]    Report Status 04/19/2022 FINAL  Final  Culture, blood (routine x 2)     Status: None   Collection Time: 04/14/22  8:32 AM   Specimen: BLOOD  Result Value Ref Range Status   Specimen Description BLOOD Blood Culture adequate volume  Final   Special Requests BLOOD RIGHT HAND  Final   Culture   Final    NO GROWTH 5 DAYS Performed at [facility], [address] [place], [place] [address]    Report Status 04/19/2022 FINAL  Final    Coagulation Studies: No results for input(s): "LABPROT", "INR" in the last 72 hours.  Urinalysis: Recent Labs    04/17/22 1406  COLORURINE STRAW*  LABSPEC 1.012  PHURINE 5.0  GLUCOSEU NEGATIVE  HGBUR MODERATE*  BILIRUBINUR NEGATIVE  KETONESUR NEGATIVE  PROTEINUR 100*  NITRITE NEGATIVE  LEUKOCYTESUR NEGATIVE       Imaging: No results found.   Medications:    sodium chloride Stopped (04/18/22 1123)   promethazine (PHENERGAN) injection (IM or IVPB)       apixaban  2.5 mg Oral BID  atorvastatin  10 mg Oral Daily   cyanocobalamin  2,000 mcg Oral Daily   diltiazem  240 mg Oral QHS   ezetimibe  10 mg Oral Daily   flecainide  50 mg Oral Q12H   guaiFENesin  1,200 mg Oral BID   insulin aspart  0-5 Units Subcutaneous QHS   insulin aspart  0-9 Units Subcutaneous TID WC   Ipratropium-Albuterol  1 puff Inhalation Q6H   mycophenolate  500 mg Oral BID   predniSONE  40 mg Oral Q breakfast   pregabalin  25 mg Oral BID   senna-docusate  2 tablet Oral BID   tacrolimus  3 mg Oral BID   tamsulosin  0.4 mg Oral QPC supper   sodium chloride, acetaminophen, albuterol, dextromethorphan-guaiFENesin, hydrALAZINE, meclizine, ondansetron (ZOFRAN) IV, promethazine (PHENERGAN) injection (IM or IVPB)  Assessment/ Plan:  Ms. Saraiah Bhat is a 71 y.o.  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  Patient presents to the emergency department with complaints of weakness, dizziness, nausea, and vomiting.  Patient states she was recently diagnosed with COVID-19.  She has been admitted under observation for Dehydration [E86.0] Dizziness [R42] Syncope [R55] Hypoglycemia [E16.2] Generalized weakness [R53.1] AKI (acute kidney injury) ([place]) [N17.9] Syncope, unspecified syncope type [R55] COVID-19 [U07.1] Acute renal failure superimposed on stage 3a chronic kidney disease ([place]) [N17.9, N18.31]   Acute Kidney Injury on chronic kidney disease stage IIIb with baseline creatinine 1.34 on 02/14/2022.  Acute kidney injury secondary to acute dehydration Chronic kidney disease is secondary to diabetes.  Patient has received 2 renal transplants in the past, current transplant still functional.  No IV contrast exposure.  Renal ultrasound negative for obstruction.     Renal function stable with urine output recorded of 1.1L Continue to monitor and avoid nephrotoxic agents.   Lab Results  Component Value Date   CREATININE 1.69 (H) 04/18/2022   CREATININE 1.94 (H) 04/17/2022   CREATININE 1.87 (H) 04/16/2022    Intake/Output Summary (Last 24 hours) at 04/19/2022 1057 Last data filed at 04/19/2022 0537 Gross per 24 hour  Intake 72.27 ml  Output 957 ml  Net -884.73 ml    2. Anemia of chronic kidney disease Lab Results  Component Value Date   HGB 11.8 (L) 04/17/2022  Hemoglobin within target  3. Diabetes mellitus type II with chronic kidney disease/renal manifestations: insulin dependent. Home regimen includes Levemir and Humalog. Most recent hemoglobin A1c is 5.6 on 03/25/21.   Glucose well controlled.  4.  Hypertension with chronic kidney disease.  Home regimen includes diltiazem, Zetia, lisinopril, and furosemide.  Received intermittent  Torsemide. Blood pressure stable   LOS: 4   10/10/202310:57 AM

## 2022-04-19 NOTE — Care Management Important Message (Signed)
Important Message  Patient Details  Name: Jean Johnson MRN: [id number] Date of Birth: 1950-11-10   Medicare Important Message Given:  Yes  Patient is in an isolation room so I talked with her by phone [phone]) and reviewed the Important Message from Medicare with her. She is in agreement with her discharge and I thanked her for her time.   Juliann Pulse A Adrienne Trombetta 04/19/2022, 1:31 PM

## 2022-04-25 ENCOUNTER — Inpatient Hospital Stay
Admission: EM | Admit: 2022-04-25 | Discharge: 2022-05-02 | DRG: 698 | Disposition: A | Discharge status: Skilled Nursing Facility | Payer: Medicare Other | Attending: Internal Medicine | Admitting: Internal Medicine

## 2022-04-25 ENCOUNTER — Other Ambulatory Visit: Payer: Self-pay

## 2022-04-25 ENCOUNTER — Emergency Department: Payer: Medicare Other

## 2022-04-25 ENCOUNTER — Encounter: Payer: Self-pay | Admitting: Emergency Medicine

## 2022-04-25 DIAGNOSIS — Z794 Long term (current) use of insulin: Secondary | ICD-10-CM

## 2022-04-25 DIAGNOSIS — Z9889 Other specified postprocedural states: Secondary | ICD-10-CM

## 2022-04-25 DIAGNOSIS — M810 Age-related osteoporosis without current pathological fracture: Secondary | ICD-10-CM | POA: Diagnosis present

## 2022-04-25 DIAGNOSIS — Z8616 Personal history of COVID-19: Secondary | ICD-10-CM

## 2022-04-25 DIAGNOSIS — L89152 Pressure ulcer of sacral region, stage 2: Secondary | ICD-10-CM | POA: Diagnosis present

## 2022-04-25 DIAGNOSIS — T8619 Other complication of kidney transplant: Principal | ICD-10-CM | POA: Diagnosis present

## 2022-04-25 DIAGNOSIS — Z8249 Family history of ischemic heart disease and other diseases of the circulatory system: Secondary | ICD-10-CM

## 2022-04-25 DIAGNOSIS — I130 Hypertensive heart and chronic kidney disease with heart failure and stage 1 through stage 4 chronic kidney disease, or unspecified chronic kidney disease: Secondary | ICD-10-CM | POA: Diagnosis present

## 2022-04-25 DIAGNOSIS — Y830 Surgical operation with transplant of whole organ as the cause of abnormal reaction of the patient, or of later complication, without mention of misadventure at the time of the procedure: Secondary | ICD-10-CM | POA: Diagnosis present

## 2022-04-25 DIAGNOSIS — E1142 Type 2 diabetes mellitus with diabetic polyneuropathy: Secondary | ICD-10-CM

## 2022-04-25 DIAGNOSIS — D631 Anemia in chronic kidney disease: Secondary | ICD-10-CM | POA: Diagnosis present

## 2022-04-25 DIAGNOSIS — Z7901 Long term (current) use of anticoagulants: Secondary | ICD-10-CM

## 2022-04-25 DIAGNOSIS — E785 Hyperlipidemia, unspecified: Secondary | ICD-10-CM | POA: Diagnosis not present

## 2022-04-25 DIAGNOSIS — E861 Hypovolemia: Secondary | ICD-10-CM | POA: Diagnosis present

## 2022-04-25 DIAGNOSIS — E162 Hypoglycemia, unspecified: Secondary | ICD-10-CM | POA: Diagnosis present

## 2022-04-25 DIAGNOSIS — R112 Nausea with vomiting, unspecified: Principal | ICD-10-CM | POA: Diagnosis present

## 2022-04-25 DIAGNOSIS — Z880 Allergy status to penicillin: Secondary | ICD-10-CM

## 2022-04-25 DIAGNOSIS — N179 Acute kidney failure, unspecified: Secondary | ICD-10-CM

## 2022-04-25 DIAGNOSIS — Z79899 Other long term (current) drug therapy: Secondary | ICD-10-CM

## 2022-04-25 DIAGNOSIS — E11649 Type 2 diabetes mellitus with hypoglycemia without coma: Secondary | ICD-10-CM | POA: Diagnosis present

## 2022-04-25 DIAGNOSIS — R531 Weakness: Secondary | ICD-10-CM

## 2022-04-25 DIAGNOSIS — Z7969 Long term (current) use of other immunomodulators and immunosuppressants: Secondary | ICD-10-CM

## 2022-04-25 DIAGNOSIS — I509 Heart failure, unspecified: Secondary | ICD-10-CM

## 2022-04-25 DIAGNOSIS — I4891 Unspecified atrial fibrillation: Secondary | ICD-10-CM | POA: Diagnosis not present

## 2022-04-25 DIAGNOSIS — I480 Paroxysmal atrial fibrillation: Secondary | ICD-10-CM | POA: Diagnosis present

## 2022-04-25 DIAGNOSIS — Z833 Family history of diabetes mellitus: Secondary | ICD-10-CM

## 2022-04-25 DIAGNOSIS — N1832 Chronic kidney disease, stage 3b: Secondary | ICD-10-CM | POA: Diagnosis present

## 2022-04-25 DIAGNOSIS — E1122 Type 2 diabetes mellitus with diabetic chronic kidney disease: Secondary | ICD-10-CM | POA: Diagnosis present

## 2022-04-25 DIAGNOSIS — I5031 Acute diastolic (congestive) heart failure: Secondary | ICD-10-CM

## 2022-04-25 DIAGNOSIS — I350 Nonrheumatic aortic (valve) stenosis: Secondary | ICD-10-CM | POA: Diagnosis present

## 2022-04-25 DIAGNOSIS — Z7952 Long term (current) use of systemic steroids: Secondary | ICD-10-CM

## 2022-04-25 DIAGNOSIS — Z940 Kidney transplant status: Secondary | ICD-10-CM

## 2022-04-25 DIAGNOSIS — N186 End stage renal disease: Secondary | ICD-10-CM | POA: Diagnosis present

## 2022-04-25 DIAGNOSIS — E860 Dehydration: Secondary | ICD-10-CM | POA: Diagnosis present

## 2022-04-25 DIAGNOSIS — L8990 Pressure ulcer of unspecified site, unspecified stage: Secondary | ICD-10-CM | POA: Diagnosis present

## 2022-04-25 DIAGNOSIS — I5043 Acute on chronic combined systolic (congestive) and diastolic (congestive) heart failure: Secondary | ICD-10-CM | POA: Diagnosis not present

## 2022-04-25 DIAGNOSIS — J90 Pleural effusion, not elsewhere classified: Secondary | ICD-10-CM

## 2022-04-25 DIAGNOSIS — Z881 Allergy status to other antibiotic agents status: Secondary | ICD-10-CM

## 2022-04-25 LAB — BASIC METABOLIC PANEL
Anion gap: 9 (ref 5–15)
BUN: 25 mg/dL — ABNORMAL HIGH (ref 8–23)
CO2: 22 mmol/L (ref 22–32)
Calcium: 8.8 mg/dL — ABNORMAL LOW (ref 8.9–10.3)
Chloride: 112 mmol/L — ABNORMAL HIGH (ref 98–111)
Creatinine, Ser: 1.72 mg/dL — ABNORMAL HIGH (ref 0.44–1.00)
GFR, Estimated: 31 mL/min — ABNORMAL LOW (ref >60)
Glucose, Bld: 76 mg/dL (ref 70–99)
Potassium: 3.7 mmol/L (ref 3.5–5.1)
Sodium: 143 mmol/L (ref 135–145)

## 2022-04-25 LAB — CBC
HCT: 38.2 % (ref 36.0–46.0)
Hemoglobin: 11.7 g/dL — ABNORMAL LOW (ref 12.0–15.0)
MCH: 29.9 pg (ref 26.0–34.0)
MCHC: 30.6 g/dL (ref 30.0–36.0)
MCV: 97.7 fL (ref 80.0–100.0)
Platelets: 232 10*3/uL (ref 150–400)
RBC: 3.91 MIL/uL (ref 3.87–5.11)
RDW: 13.8 % (ref 11.5–15.5)
WBC: 10.1 10*3/uL (ref 4.0–10.5)
nRBC: 0 % (ref 0.0–0.2)

## 2022-04-25 LAB — TROPONIN I (HIGH SENSITIVITY)
Troponin I (High Sensitivity): 74 ng/L — ABNORMAL HIGH (ref <18)
Troponin I (High Sensitivity): 102 ng/L (ref <18)

## 2022-04-25 LAB — MAGNESIUM: Magnesium: 2.1 mg/dL (ref 1.7–2.4)

## 2022-04-25 LAB — PROCALCITONIN: Procalcitonin: <0.1 ng/mL

## 2022-04-25 MED ORDER — SODIUM CHLORIDE 0.9 % IV SOLN: INTRAVENOUS | Status: DC

## 2022-04-25 MED ORDER — IPRATROPIUM-ALBUTEROL 20-100 MCG/ACT IN AERS: 1.0000 | INHALATION_SPRAY | Freq: Four times a day (QID) | RESPIRATORY_TRACT | Status: DC

## 2022-04-25 MED ORDER — VITAMIN B-12 1000 MCG PO TABS
2000.0000 ug | ORAL_TABLET | Freq: Every day | ORAL | Status: DC
  Administered 2022-04-26 – 2022-05-02 (×7): 2000 ug via ORAL
  Filled 2022-04-25 (×7): qty 2

## 2022-04-25 MED ORDER — TACROLIMUS 1 MG PO CAPS
3.0000 mg | ORAL_CAPSULE | Freq: Two times a day (BID) | ORAL | Status: DC
  Administered 2022-04-26 – 2022-05-02 (×14): 3 mg via ORAL
  Filled 2022-04-25 (×14): qty 3

## 2022-04-25 MED ORDER — GUAIFENESIN ER 600 MG PO TB12
1200.0000 mg | ORAL_TABLET | Freq: Two times a day (BID) | ORAL | Status: DC | PRN
  Administered 2022-04-29: 1200 mg via ORAL
  Filled 2022-04-25: qty 2

## 2022-04-25 MED ORDER — FLECAINIDE ACETATE 100 MG PO TABS
100.0000 mg | ORAL_TABLET | Freq: Two times a day (BID) | ORAL | Status: DC
  Administered 2022-04-26 – 2022-05-02 (×13): 100 mg via ORAL
  Filled 2022-04-25 (×13): qty 1

## 2022-04-25 MED ORDER — APIXABAN 5 MG PO TABS
5.0000 mg | ORAL_TABLET | Freq: Two times a day (BID) | ORAL | Status: DC
  Administered 2022-04-26 – 2022-04-28 (×5): 5 mg via ORAL
  Filled 2022-04-25 (×5): qty 1

## 2022-04-25 MED ORDER — MAGNESIUM HYDROXIDE 400 MG/5ML PO SUSP
30.0000 mL | Freq: Every day | ORAL | Status: DC | PRN
  Administered 2022-04-29: 30 mL via ORAL
  Filled 2022-04-25: qty 30

## 2022-04-25 MED ORDER — SODIUM CHLORIDE 0.9 % IV BOLUS
500.0000 mL | Freq: Once | INTRAVENOUS | Status: AC
  Administered 2022-04-25: 500 mL via INTRAVENOUS

## 2022-04-25 MED ORDER — EZETIMIBE 10 MG PO TABS
10.0000 mg | ORAL_TABLET | Freq: Every day | ORAL | Status: DC
  Administered 2022-04-26 – 2022-05-02 (×7): 10 mg via ORAL
  Filled 2022-04-25 (×7): qty 1

## 2022-04-25 MED ORDER — ONDANSETRON HCL 4 MG/2ML IJ SOLN
4.0000 mg | Freq: Once | INTRAMUSCULAR | Status: AC
  Filled 2022-04-25: qty 2

## 2022-04-25 MED ORDER — INSULIN DETEMIR 100 UNIT/ML ~~LOC~~ SOLN: 8.0000 [IU] | Freq: Every day | SUBCUTANEOUS | Status: DC

## 2022-04-25 MED ORDER — ONDANSETRON HCL 4 MG/2ML IJ SOLN
4.0000 mg | Freq: Four times a day (QID) | INTRAMUSCULAR | Status: DC | PRN
  Administered 2022-04-26 – 2022-05-02 (×2): 4 mg via INTRAVENOUS
  Filled 2022-04-25 (×2): qty 2

## 2022-04-25 MED ORDER — DILTIAZEM HCL ER COATED BEADS 120 MG PO CP24
240.0000 mg | ORAL_CAPSULE | Freq: Every day | ORAL | Status: DC
  Administered 2022-04-26 – 2022-05-01 (×7): 240 mg via ORAL
  Filled 2022-04-25 (×3): qty 2
  Filled 2022-04-25: qty 1
  Filled 2022-04-25 (×3): qty 2

## 2022-04-25 MED ORDER — ONDANSETRON HCL 4 MG/2ML IJ SOLN
INTRAMUSCULAR | Status: AC
  Administered 2022-04-25: 4 mg via INTRAVENOUS
  Filled 2022-04-25: qty 2

## 2022-04-25 MED ORDER — ATORVASTATIN CALCIUM 10 MG PO TABS
10.0000 mg | ORAL_TABLET | Freq: Every day | ORAL | Status: DC
  Administered 2022-04-26 – 2022-05-02 (×7): 10 mg via ORAL
  Filled 2022-04-25 (×7): qty 1

## 2022-04-25 MED ORDER — PREGABALIN 50 MG PO CAPS
50.0000 mg | ORAL_CAPSULE | Freq: Two times a day (BID) | ORAL | Status: DC
  Administered 2022-04-26 – 2022-05-02 (×13): 50 mg via ORAL
  Filled 2022-04-25 (×13): qty 1

## 2022-04-25 MED ORDER — LACTATED RINGERS IV BOLUS: 1000.0000 mL | Freq: Once | INTRAVENOUS | Status: DC

## 2022-04-25 MED ORDER — ONDANSETRON HCL 4 MG PO TABS: 4.0000 mg | ORAL_TABLET | Freq: Four times a day (QID) | ORAL | Status: DC | PRN

## 2022-04-25 MED ORDER — ACETAMINOPHEN 650 MG RE SUPP: 650.0000 mg | Freq: Four times a day (QID) | RECTAL | Status: DC | PRN

## 2022-04-25 MED ORDER — MECLIZINE HCL 25 MG PO TABS
12.5000 mg | ORAL_TABLET | Freq: Three times a day (TID) | ORAL | Status: DC | PRN
  Administered 2022-04-29 – 2022-04-30 (×3): 12.5 mg via ORAL
  Filled 2022-04-25 (×4): qty 0.5

## 2022-04-25 MED ORDER — PANTOPRAZOLE SODIUM 40 MG IV SOLR
40.0000 mg | Freq: Two times a day (BID) | INTRAVENOUS | Status: DC
  Administered 2022-04-26 – 2022-04-30 (×10): 40 mg via INTRAVENOUS
  Filled 2022-04-25 (×10): qty 10

## 2022-04-25 MED ORDER — TRAZODONE HCL 50 MG PO TABS
25.0000 mg | ORAL_TABLET | Freq: Every evening | ORAL | Status: DC | PRN
  Administered 2022-04-26: 25 mg via ORAL
  Filled 2022-04-25: qty 1

## 2022-04-25 MED ORDER — MYCOPHENOLATE MOFETIL 250 MG PO CAPS
500.0000 mg | ORAL_CAPSULE | Freq: Two times a day (BID) | ORAL | Status: DC
  Administered 2022-04-26 – 2022-05-02 (×14): 500 mg via ORAL
  Filled 2022-04-25 (×14): qty 2

## 2022-04-25 MED ORDER — ACETAMINOPHEN 325 MG PO TABS: 650.0000 mg | ORAL_TABLET | Freq: Four times a day (QID) | ORAL | Status: DC | PRN

## 2022-04-25 NOTE — H&P (Addendum)
[place]   PATIENT NAME: Jean Johnson    MR#:  [id number]  DATE OF BIRTH:  March 05, 1951  DATE OF ADMISSION:  04/25/2022  PRIMARY CARE PHYSICIAN: Gladstone Lighter, MD   Patient is coming from: Home  REQUESTING/REFERRING PHYSICIAN: Blake Divine, MD  CHIEF COMPLAINT:   Chief Complaint  Patient presents with   Weakness   Emesis    HISTORY OF PRESENT ILLNESS:  Jean Johnson is a 71 y.o. African-American female with medical history significant for paroxysmal atrial fibrillation on Eliquis, type 2 diabetes mellitus with diabetic neuropathy, end-stage renal disease, s/p renal transplant twice in 2000 and 2010, dyslipidemia and hypertension, who presented to the emergency room with acute onset of intractable nausea and vomiting with associated generalized weakness.  She was initially admitted here and discharged two days ago during which she was managed for COVID-19, acute on chronic diastolic CHF and AKI.  She continued to feel weak after discharge and has been having nausea with recurrent vomiting as mentioned above.  She is occasionally able to keep fluids down.  Has been very difficult to keep solids down.  She has been tolerating her medications though and regularly taking her antirejection medications for her renal transplant.  No fever or chills.  No cough or dyspnea.  She is occasional wheezing.  No chest pain or palpitations today.  No dysuria, oliguria or hematuria or flank pain.  No bleeding diathesis.  The patient and the family refused SNF placement at the time of her last discharge and they are currently agreeable with rehabilitation placement.  ED Course: Upon presentation to the ER, BP was 150/80 with otherwise normal vital signs.  The respiratory rate later on was 28.  Labs revealed a BUN of 25 and creatinine 1.72.  High sensitive troponin was 74 and later 102.  Magnesium was 2.1 CBC showed hemoglobin 11.7 hematocrit 38.2.  Procalcitonin was less than 0.1.     EKG as reviewed by me : EKG showed atrial fibrillation with rapid ventricular sponsor 111 with T wave inversion laterally with poor R wave progression. Imaging: Two-view chest x-ray showed cardiomegaly with small bilateral pleural effusions and central congestion.  Overall improved aeration of the left thorax compared to 04/17/2022.  The patient was given 500 mill IV normal saline with 4 mg of IV Zofran.  She will be admitted to a medical bed for further evaluation and management. PAST MEDICAL HISTORY:   Past Medical History:  Diagnosis Date   Anemia of chronic renal disease    Aortic stenosis, mild    a.) TTE on 11/23/2020 --> mean gradient 9.7 mmHg   Atrial fibrillation (HCC)    a.) CHA2DS2-VASc = 3 (age, sex, diabetes). b.) daily apixaban   B12 deficiency    Cervical spinal stenosis    Chronic anticoagulation    Apixaban   Diabetic neuropathy (HCC)    Diverticulosis    Dyspnea    ESRD (end stage renal disease) (HCC)    First degree AV block    HLD (hyperlipidemia)    Hypertension    Incomplete right bundle branch block (RBBB)    LAE (left atrial enlargement)    a.) TTE on 11/23/2020 --> moderate   Left thyroid nodule    a.) Cervical MRI on 12/29/2020 --> measured "at least" 3.5 cm; imcompletely imaged.   Long-term use of immunosuppressant medication    a.) takes daily mycophenolate, tacrolimus, prednisone   Murmur    Nephrolithiasis  Osteoporosis    PONV (postoperative nausea and vomiting)    Post-transplant diabetes mellitus ([place])    Renal transplant recipient    a.) living donor transplant from sister on 12/26/1998; rejected organ in 2006 and restarted on hemodialysis. 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   Valvular heart disease    a.) TTE 11/23/2020 --> mild panvalvular regurgitation    PAST SURGICAL HISTORY:   Past Surgical History:  Procedure Laterality Date   ANTERIOR CERVICAL DECOMP/DISCECTOMY FUSION N/A  03/24/2021   Procedure: C3-6 ANTERIOR CERVICAL DECOMPRESSION/DISCECTOMY FUSION 3 LEVELS;  Surgeon: Meade Maw, MD;  Location: [facility];  Service: Neurosurgery;  Laterality: N/A;   CATARACT EXTRACTION W/PHACO Left 10/21/2020   Procedure: CATARACT EXTRACTION PHACO AND INTRAOCULAR LENS PLACEMENT ([place]) DIABETES LEFT;  Surgeon: Leandrew Koyanagi, MD;  Location: [facility];  Service: Ophthalmology;  Laterality: Left;  2.84 0:48.8 5.8%   COLONOSCOPY WITH PROPOFOL N/A 09/03/2021   Procedure: COLONOSCOPY WITH PROPOFOL;  Surgeon: Lesly Rubenstein, MD;  Location: [facility];  Service: Endoscopy;  Laterality: N/A;  DM, ELIQUIS, WHEELCHAIR   EYE SURGERY Right 2020   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   Living donor organ recipient (sister)   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   Cadaveric organ recipient    SOCIAL HISTORY:   Social History   Tobacco Use   Smoking status: Never   Smokeless tobacco: Never  Substance Use Topics   Alcohol use: Not Currently    FAMILY HISTORY:   Family History  Problem Relation Age of Onset   Diabetes Father    Heart disease Father     DRUG ALLERGIES:   Allergies  Allergen Reactions   E-Mycin [Erythromycin] Swelling    And itching    Penicillins Swelling    Throat     REVIEW OF SYSTEMS:   ROS As per history of present illness. All pertinent systems were reviewed above. Constitutional, HEENT, cardiovascular, respiratory, GI, GU, musculoskeletal, neuro, psychiatric, endocrine, integumentary and hematologic systems were reviewed and are otherwise negative/unremarkable except for positive findings mentioned above in the HPI.   MEDICATIONS AT HOME:   Prior to Admission medications   Medication Sig Start Date End Date Taking? Authorizing Provider  apixaban (ELIQUIS) 5 MG TABS tablet Take 5 mg by mouth 2 (two) times daily.    [provider]  Artificial Tear Ointment (DRY EYES OP) Place 1 drop into both eyes daily as  needed (blurry Vision or floater in eyes).    [provider]  atorvastatin (LIPITOR) 10 MG tablet Take 10 mg by mouth daily.    [provider]  cyanocobalamin 2000 MCG tablet Take 2,000 mcg by mouth daily.    [provider]  diltiazem (CARDIZEM CD) 240 MG 24 hr capsule Take 240 mg by mouth at bedtime.    [provider]  ezetimibe (ZETIA) 10 MG tablet Take 10 mg by mouth daily.    [provider]  flecainide (TAMBOCOR) 100 MG tablet Take 100 mg by mouth every 12 (twelve) hours.    [provider]  guaiFENesin (MUCINEX) 600 MG 12 hr tablet Take 2 tablets (1,200 mg total) by mouth 2 (two) times daily for 7 days. 04/19/22 04/26/22  Sharen Hones, MD  insulin detemir (LEVEMIR) 100 UNIT/ML injection Inject 8 Units into the skin at bedtime.    [provider]  insulin lispro (HUMALOG) 100 UNIT/ML KwikPen Inject 0-5 Units into the skin in the morning and at bedtime. Sliding scale  [provider]  Ipratropium-Albuterol (COMBIVENT) 20-100 MCG/ACT AERS respimat Inhale 1 puff into the lungs every 6 (six) hours for 10 days. 04/19/22 04/29/22  Sharen Hones, MD  meclizine (ANTIVERT) 12.5 MG tablet Take 12.5 mg by mouth 3 (three) times daily as needed for dizziness.    [provider]  mycophenolate (CELLCEPT) 500 MG tablet Take 500 mg by mouth 2 (two) times daily.    [provider]  predniSONE (DELTASONE) 5 MG tablet Take 5 mg by mouth daily with breakfast.    [provider]  pregabalin (LYRICA) 25 MG capsule Take 50 mg by mouth 2 (two) times daily. 12/04/20   [provider]  tacrolimus (PROGRAF) 1 MG capsule Take 3 mg by mouth 2 (two) times daily.    [provider]      VITAL SIGNS:  Blood pressure (!) 156/85, pulse 94, temperature 98 F (36.7 C), temperature source Oral, resp. rate 17, height 5' (1.524 m), weight 50.8 kg, SpO2 98 %.  PHYSICAL EXAMINATION:  Physical Exam  GENERAL:   71 y.o.-year-old African-American female patient lying in the bed with no acute distress.  EYES: Pupils equal, round, reactive to light and accommodation. No scleral icterus. Extraocular muscles intact.  HEENT: Head atraumatic, normocephalic. Oropharynx with dry mucous membrane and tongue and nasopharynx clear.  NECK:  Supple, no jugular venous distention. No thyroid enlargement, no tenderness.  LUNGS: Mildly diminished bibasal breath sounds with no wheezing, rales,rhonchi or crepitation. No use of accessory muscles of respiration.  CARDIOVASCULAR: Regular rate and rhythm, S1, S2 normal. No murmurs, rubs, or gallops.  ABDOMEN: Soft, nondistended, nontender. Bowel sounds present. No organomegaly or mass.  EXTREMITIES: No pedal edema, cyanosis, or clubbing.  NEUROLOGIC: Cranial nerves II through XII are intact. Muscle strength 5/5 in all extremities. Sensation intact. Gait not checked.  PSYCHIATRIC: The patient is alert and oriented x 3.  Normal affect and good eye contact. SKIN: No obvious rash, lesion, or ulcer.   LABORATORY PANEL:   CBC Recent Labs  Lab 04/25/22 1222  WBC 10.1  HGB 11.7*  HCT 38.2  PLT 232   ------------------------------------------------------------------------------------------------------------------  Chemistries  Recent Labs  Lab 04/25/22 1222 04/25/22 1249  NA 143  --   K 3.7  --   CL 112*  --   CO2 22  --   GLUCOSE 76  --   BUN 25*  --   CREATININE 1.72*  --   CALCIUM 8.8*  --   MG  --  2.1   ------------------------------------------------------------------------------------------------------------------  Cardiac Enzymes No results for input(s): "TROPONINI" in the last 168 hours. ------------------------------------------------------------------------------------------------------------------  RADIOLOGY:  DG Chest 2 View  Result Date: 04/25/2022 CLINICAL DATA:  Weakness EXAM: CHEST - 2 VIEW COMPARISON:  04/17/2022, 04/14/2022 FINDINGS: Small  bilateral pleural effusions. Cardiomegaly with vascular congestion. Overall improved aeration of left chest compared with 04/17/2022. No pneumothorax. Aortic atherosclerosis. IMPRESSION: Cardiomegaly with small bilateral effusions and central congestion. Overall improved aeration of left thorax compared with 04/17/2022 Electronically Signed   By: Donavan Foil M.D.   On: 04/25/2022 17:52      IMPRESSION AND PLAN:  Assessment and Plan: * Intractable nausea and vomiting - The patient will be admitted to an observation medical bed. - This is likely the culprit for her generalized weakness from volume depletion and dehydration. - We will continue hydration with IV normal saline. - Scheduled and as needed antiemetics will be provided. - The patient will be placed on IV PPI therapy with Protonix. -  Case management consult to be obtained for arrangement for rehabilitation placement.  Atrial fibrillation with rapid ventricular response ([place]) - The patient will hydrated as mentioned above. - We will continue flecainide Cardizem CD. - We will continue Eliquis. - We will utilize a as needed IV Cardizem as needed for rapid rate.  Dyslipidemia - We will continue statin therapy and Zetia.  Renal transplant recipient - We will continue Prograf and CellCept as well as prednisone.  Type 2 diabetes mellitus with peripheral neuropathy (HCC) -The patient will be placed on supplemental coverage with NovoLog. - We will continue basal coverage. - We will continue Lyrica.   DVT prophylaxis: Eliquis. Advanced Care Planning:  Code Status: full code.  Family Communication:  The plan of care was discussed in details with the patient (and family). I answered all questions. The patient agreed to proceed with the above mentioned plan. Further management will depend upon hospital course. Disposition Plan: Back to previous home environment Consults called: none.  All the records are reviewed and case discussed  with ED provider.  Status is: Observation   I certify that at the time of admission, it is my clinical judgment that the patient will require hospital care extending less than 2 midnights.                            Dispo: The patient is from: Home              Anticipated d/c is to: Home              Patient currently is not medically stable to d/c.              Difficult to place patient: No  Christel Mormon M.D on 04/25/2022 at 11:24 PM  [facility]   From 7 PM-7 AM, contact night-coverage [email or web address]  CC: Primary care physician; Gladstone Lighter, MD

## 2022-04-25 NOTE — Assessment & Plan Note (Signed)
-  Continue Zetia and Lipitor 

## 2022-04-25 NOTE — ED Triage Notes (Signed)
Pt to ED via POV for weakness and vomiting. Pt was diagnosed with COVID on 9/25. Pt was in the hospital recently for increased weakness. Pt states that she is still not feeling well. Pt states that she is not able to keep food down. Pt last vomited this morning. Pt states that she is able to keep fluids down. Pt is currently in NAD.

## 2022-04-25 NOTE — Assessment & Plan Note (Addendum)
-   The patient will be admitted to an observation medical bed. - This is likely the culprit for her generalized weakness from volume depletion and dehydration. - We will continue hydration with IV normal saline. - Scheduled and as needed antiemetics will be provided. - The patient will be placed on IV PPI therapy with Protonix. - Case management consult to be obtained for arrangement for rehabilitation placement.

## 2022-04-25 NOTE — Assessment & Plan Note (Addendum)
-   The patient will hydrated as mentioned above. - We will continue flecainide Cardizem CD. - We will continue Eliquis. - We will utilize a as needed IV Cardizem as needed for rapid rate.

## 2022-04-25 NOTE — Assessment & Plan Note (Signed)
-   We will continue Prograf and CellCept as well as prednisone.

## 2022-04-25 NOTE — ED Provider Notes (Signed)
[facility] Provider Note    Event Date/Time   First MD Initiated Contact with Patient 04/25/22 1656     (approximate)   History   Chief Complaint Weakness and Emesis   HPI  Jean Johnson is a 71 y.o. female with past medical history of hypertension, hyperlipidemia, diabetes, renal transplant, CKD, atrial fibrillation on Eliquis, and diastolic CHF who presents to the ED complaining of weakness and nausea.  Patient was discharged from the hospital 6 days ago following admission for COVID-19, AKI, and acute on chronic diastolic CHF.  She states that since she was discharged, she has continued to feel weak with nausea and multiple episodes of vomiting.  She states that it is very difficult for her to eat solids, she is occasionally able to drink water but not as much as she would like.  She states she is typically able to tolerate her medications and has been taking her antirejection medications as prescribed.  She denies any change in her urine output and has not had any dysuria.  She denies any fevers or cough, does state that she has continued to have occasional aching pain in the center of her chest which she was also experiencing while admitted.  She denies any chest pain currently and has not had any difficulty breathing.     Physical Exam   Triage Vital Signs: ED Triage Vitals  Enc Vitals Group     BP 04/25/22 1210 (!) 158/84     Pulse Rate 04/25/22 1210 (!) 113     Resp 04/25/22 1210 16     Temp 04/25/22 1210 98.7 F (37.1 C)     Temp Source 04/25/22 1210 Oral     SpO2 04/25/22 1210 100 %     Weight 04/25/22 1658 111 lb 15.9 oz (50.8 kg)     Height 04/25/22 1658 5' (1.524 m)     Head Circumference --      Peak Flow --      Pain Score 04/25/22 1210 6     Pain Loc --      Pain Edu? --      Excl. in [place]? --     Most recent vital signs: Vitals:   04/25/22 1700 04/25/22 1834  BP:  (!) 156/85  Pulse:  94  Resp:  17  Temp: 98 F (36.7 C)    SpO2:  98%    Constitutional: Alert and oriented. Eyes: Conjunctivae are normal. Head: Atraumatic. Nose: No congestion/rhinnorhea. Mouth/Throat: Mucous membranes are dry. Cardiovascular: Normal rate, regular rhythm. Grossly normal heart sounds.  2+ radial pulses bilaterally. Respiratory: Normal respiratory effort.  No retractions. Lungs CTAB. Gastrointestinal: Soft and nontender. No distention. Musculoskeletal: No lower extremity tenderness nor edema.  Neurologic:  Normal speech and language. No gross focal neurologic deficits are appreciated.    ED Results / Procedures / Treatments   Labs (all labs ordered are listed, but only abnormal results are displayed) Labs Reviewed  BASIC METABOLIC PANEL - Abnormal; Notable for the following components:      Result Value   Chloride 112 (*)    BUN 25 (*)    Creatinine, Ser 1.72 (*)    Calcium 8.8 (*)    GFR, Estimated 31 (*)    All other components within normal limits  CBC - Abnormal; Notable for the following components:   Hemoglobin 11.7 (*)    All other components within normal limits  TROPONIN I (HIGH SENSITIVITY) - Abnormal; Notable for  the following components:   Troponin I (High Sensitivity) 74 (*)    All other components within normal limits  TROPONIN I (HIGH SENSITIVITY) - Abnormal; Notable for the following components:   Troponin I (High Sensitivity) 102 (*)    All other components within normal limits  MAGNESIUM  PROCALCITONIN  URINALYSIS, ROUTINE W REFLEX MICROSCOPIC  CBG MONITORING, ED     EKG  ED ECG REPORT I, Blake Divine, the attending physician, personally viewed and interpreted this ECG.   Date: 04/25/2022  EKG Time: 12:27  Rate: 111  Rhythm: atrial fibrillation  Axis: Normal  Intervals:none  ST&T Change: Normal  PROCEDURES:  Critical Care performed: No  Procedures   MEDICATIONS ORDERED IN ED: Medications  ondansetron (ZOFRAN) injection 4 mg (4 mg Intravenous Given 04/25/22 2026)   sodium chloride 0.9 % bolus 500 mL (500 mLs Intravenous New Bag/Given 04/25/22 2031)     IMPRESSION / MDM / ASSESSMENT AND PLAN / ED COURSE  I reviewed the triage vital signs and the nursing notes.                              71 y.o. female with a past medical history of hypertension, hyperlipidemia, diabetes, renal transplant, CKD, atrial fibrillation on Eliquis, and diastolic CHF who presents to the ED with ongoing weakness with nausea and vomiting following recent admission for COVID-19, AKI, and CHF exacerbation.  Patient's presentation is most consistent with acute presentation with potential threat to life or bodily function.  Differential diagnosis includes, but is not limited to, AKI, electrolyte abnormality, UTI, dehydration, COVID-19, respiratory failure, pneumonia, CHF exacerbation.  Patient nontoxic-appearing and in no acute distress, vital signs are unremarkable.  She denies any difficulty breathing, is not in any respiratory distress and maintaining oxygen saturations on room air.  She does appear dehydrated, although renal function stable compared to her recent discharge with no significant electrolyte abnormality.  We will hydrate with IV fluids and treat with IV Zofran.  She does endorse some discomfort in her chest, however this has been ongoing since her recent admission when troponin was elevated likely secondary to CHF and COVID-19.  Troponin today is downtrending and I doubt ACS, low suspicion for PE as she is already anticoagulated.  EKG does show atrial fibrillation with mild elevation in heart rate at 111, no ischemic changes noted.  Per chart, she does have chronic atrial fibrillation and we will reassess heart rate following IV fluid bolus.  Heart rate improving following IV fluids and patient reports nausea improved following dose of Zofran, however she continues to feel profoundly weak to the point that she is having difficulty walking.  Troponin is uptrending on  recheck but patient continues to deny any chest pain or shortness of breath.  Chest x-ray shows cardiomegaly with left-sided effusion but doubt pneumonia as she denies any fevers or cough, procalcitonin is undetectable.  She appears dehydrated rather than fluid overloaded, doubt CHF exacerbation.  Patient was previously offered rehab placement at the time of last discharge, patient and family are now desiring rehab.  We will admit for intractable nausea and vomiting with elevated troponin, case discussed with hospitalist.    FINAL CLINICAL IMPRESSION(S) / ED DIAGNOSES   Final diagnoses:  Intractable nausea and vomiting  Renal transplant recipient  Generalized weakness     Rx / DC Orders   ED Discharge Orders     None  Note:  This document was prepared using Dragon voice recognition software and may include unintentional dictation errors.   Blake Divine, MD 04/25/22 2203

## 2022-04-25 NOTE — Assessment & Plan Note (Addendum)
-  The patient will be placed on supplemental coverage with NovoLog. - We will continue basal coverage. - We will continue Lyrica.

## 2022-04-26 DIAGNOSIS — Z7901 Long term (current) use of anticoagulants: Secondary | ICD-10-CM | POA: Diagnosis not present

## 2022-04-26 DIAGNOSIS — E1122 Type 2 diabetes mellitus with diabetic chronic kidney disease: Secondary | ICD-10-CM | POA: Diagnosis present

## 2022-04-26 DIAGNOSIS — Z794 Long term (current) use of insulin: Secondary | ICD-10-CM | POA: Diagnosis not present

## 2022-04-26 DIAGNOSIS — I4891 Unspecified atrial fibrillation: Secondary | ICD-10-CM | POA: Diagnosis not present

## 2022-04-26 DIAGNOSIS — L89152 Pressure ulcer of sacral region, stage 2: Secondary | ICD-10-CM | POA: Diagnosis present

## 2022-04-26 DIAGNOSIS — I130 Hypertensive heart and chronic kidney disease with heart failure and stage 1 through stage 4 chronic kidney disease, or unspecified chronic kidney disease: Secondary | ICD-10-CM | POA: Diagnosis present

## 2022-04-26 DIAGNOSIS — E785 Hyperlipidemia, unspecified: Secondary | ICD-10-CM | POA: Diagnosis present

## 2022-04-26 DIAGNOSIS — I350 Nonrheumatic aortic (valve) stenosis: Secondary | ICD-10-CM | POA: Diagnosis present

## 2022-04-26 DIAGNOSIS — T8619 Other complication of kidney transplant: Secondary | ICD-10-CM | POA: Diagnosis present

## 2022-04-26 DIAGNOSIS — E1142 Type 2 diabetes mellitus with diabetic polyneuropathy: Secondary | ICD-10-CM | POA: Diagnosis present

## 2022-04-26 DIAGNOSIS — N1832 Chronic kidney disease, stage 3b: Secondary | ICD-10-CM | POA: Diagnosis present

## 2022-04-26 DIAGNOSIS — I5031 Acute diastolic (congestive) heart failure: Secondary | ICD-10-CM | POA: Diagnosis not present

## 2022-04-26 DIAGNOSIS — E860 Dehydration: Secondary | ICD-10-CM | POA: Diagnosis present

## 2022-04-26 DIAGNOSIS — E861 Hypovolemia: Secondary | ICD-10-CM | POA: Diagnosis present

## 2022-04-26 DIAGNOSIS — N186 End stage renal disease: Secondary | ICD-10-CM | POA: Diagnosis present

## 2022-04-26 DIAGNOSIS — Y830 Surgical operation with transplant of whole organ as the cause of abnormal reaction of the patient, or of later complication, without mention of misadventure at the time of the procedure: Secondary | ICD-10-CM | POA: Diagnosis present

## 2022-04-26 DIAGNOSIS — M810 Age-related osteoporosis without current pathological fracture: Secondary | ICD-10-CM | POA: Diagnosis present

## 2022-04-26 DIAGNOSIS — N184 Chronic kidney disease, stage 4 (severe): Secondary | ICD-10-CM | POA: Diagnosis not present

## 2022-04-26 DIAGNOSIS — R531 Weakness: Secondary | ICD-10-CM | POA: Diagnosis present

## 2022-04-26 DIAGNOSIS — Z8616 Personal history of COVID-19: Secondary | ICD-10-CM | POA: Diagnosis not present

## 2022-04-26 DIAGNOSIS — Z8249 Family history of ischemic heart disease and other diseases of the circulatory system: Secondary | ICD-10-CM | POA: Diagnosis not present

## 2022-04-26 DIAGNOSIS — Z881 Allergy status to other antibiotic agents status: Secondary | ICD-10-CM | POA: Diagnosis not present

## 2022-04-26 DIAGNOSIS — D631 Anemia in chronic kidney disease: Secondary | ICD-10-CM | POA: Diagnosis present

## 2022-04-26 DIAGNOSIS — R112 Nausea with vomiting, unspecified: Secondary | ICD-10-CM | POA: Diagnosis not present

## 2022-04-26 DIAGNOSIS — I480 Paroxysmal atrial fibrillation: Secondary | ICD-10-CM | POA: Diagnosis present

## 2022-04-26 DIAGNOSIS — Z880 Allergy status to penicillin: Secondary | ICD-10-CM | POA: Diagnosis not present

## 2022-04-26 DIAGNOSIS — I5043 Acute on chronic combined systolic (congestive) and diastolic (congestive) heart failure: Secondary | ICD-10-CM | POA: Diagnosis not present

## 2022-04-26 DIAGNOSIS — Z833 Family history of diabetes mellitus: Secondary | ICD-10-CM | POA: Diagnosis not present

## 2022-04-26 DIAGNOSIS — E11649 Type 2 diabetes mellitus with hypoglycemia without coma: Secondary | ICD-10-CM | POA: Diagnosis present

## 2022-04-26 DIAGNOSIS — N179 Acute kidney failure, unspecified: Secondary | ICD-10-CM | POA: Diagnosis present

## 2022-04-26 LAB — CBG MONITORING, ED
Glucose-Capillary: 37 mg/dL — CL (ref 70–99)
Glucose-Capillary: 42 mg/dL — CL (ref 70–99)
Glucose-Capillary: 71 mg/dL (ref 70–99)
Glucose-Capillary: 153 mg/dL — ABNORMAL HIGH (ref 70–99)

## 2022-04-26 LAB — BASIC METABOLIC PANEL
Anion gap: 14 (ref 5–15)
BUN: 29 mg/dL — ABNORMAL HIGH (ref 8–23)
CO2: 15 mmol/L — ABNORMAL LOW (ref 22–32)
Calcium: 8.1 mg/dL — ABNORMAL LOW (ref 8.9–10.3)
Chloride: 112 mmol/L — ABNORMAL HIGH (ref 98–111)
Creatinine, Ser: 1.49 mg/dL — ABNORMAL HIGH (ref 0.44–1.00)
GFR, Estimated: 37 mL/min — ABNORMAL LOW (ref >60)
Glucose, Bld: 36 mg/dL — CL (ref 70–99)
Potassium: 3.9 mmol/L (ref 3.5–5.1)
Sodium: 141 mmol/L (ref 135–145)

## 2022-04-26 LAB — GLUCOSE, CAPILLARY
Glucose-Capillary: 72 mg/dL (ref 70–99)
Glucose-Capillary: 114 mg/dL — ABNORMAL HIGH (ref 70–99)

## 2022-04-26 LAB — CBC
HCT: 28 % — ABNORMAL LOW (ref 36.0–46.0)
Hemoglobin: 8.6 g/dL — ABNORMAL LOW (ref 12.0–15.0)
MCH: 30.3 pg (ref 26.0–34.0)
MCHC: 30.7 g/dL (ref 30.0–36.0)
MCV: 98.6 fL (ref 80.0–100.0)
Platelets: 140 10*3/uL — ABNORMAL LOW (ref 150–400)
RBC: 2.84 MIL/uL — ABNORMAL LOW (ref 3.87–5.11)
RDW: 14 % (ref 11.5–15.5)
WBC: 7.6 10*3/uL (ref 4.0–10.5)
nRBC: 0 % (ref 0.0–0.2)

## 2022-04-26 MED ORDER — INSULIN ASPART 100 UNIT/ML IJ SOLN: 0.0000 [IU] | Freq: Three times a day (TID) | INTRAMUSCULAR | Status: DC

## 2022-04-26 MED ORDER — DEXTROSE 50 % IV SOLN
1.0000 | Freq: Once | INTRAVENOUS | Status: DC
  Filled 2022-04-26: qty 50

## 2022-04-26 MED ORDER — IPRATROPIUM-ALBUTEROL 0.5-2.5 (3) MG/3ML IN SOLN: 3.0000 mL | Freq: Four times a day (QID) | RESPIRATORY_TRACT | Status: DC

## 2022-04-26 MED ORDER — LACTATED RINGERS IV SOLN: INTRAVENOUS | Status: DC

## 2022-04-26 MED ORDER — PREDNISONE 10 MG PO TABS
5.0000 mg | ORAL_TABLET | Freq: Every day | ORAL | Status: DC
  Administered 2022-04-27 – 2022-05-02 (×6): 5 mg via ORAL
  Filled 2022-04-26 (×6): qty 1

## 2022-04-26 NOTE — Hospital Course (Addendum)
Jean Johnson is a 71 y.o. 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  She was initially admitted here and discharged two days ago during which she was managed for COVID-19, acute on chronic diastolic CHF and AKI, on that admission she refused SNF placement on discharge..  She continued to feel weak after discharge and has been having N/V. 10/16: admitted for IV fluids and IV antiemetics. SNF placement pending.  10/17: Significantly hypoglycemic this morning, corrected with D50.  TOC pending. Maintains on IV fluids. PT eval pending.  10/18: PT recommending SNF, placement pending. AKI, keep on fluids  10/19: AKI worsening despite fluids, nephrology consulted --> Maintain on IV fluids. Restarted insulin sensitive sliding scale   10/20: SOB overnight, crackles per RN, night coverage ordered CXR, concerning for increased pulm edema / effusion. Echo ordered. BNP added to AM labs. Thoracentesis ordered, Eliquis held. IV fluid rate reduced and dose Lasix administered per nephrology Cr improved somewhat on AM labs.  BNP came back with elevation at 567. 10/21: Creatinine slightly improved but not back at baseline.  Shortness of breath has essentially resolved, patient still feeling significant fatigue.  Echo resulted, LVEF 60 to 65%, normal LV function, no RWMA,(+) LVH, diastolic function could not be evaluated, RV systolic function moderately reduced, RV mildly enlarged.  LA/RA severely dilated.  Small pericardial effusion. Labs from pleural fluid transudative.  10/22: Creatinine continues to improve, per nephrology via secure chat keep through tonight on LR  75 mL/h and recheck in a.m.  Consultants:  none  Procedures: 04/29/22 thoracentesis yielded 350 mL watery straw colored fluid, labs transudate       ASSESSMENT & PLAN:   Principal Problem:   Intractable nausea and vomiting Active Problems:   Atrial fibrillation with rapid ventricular response (HCC)   Hypoglycemia   Type 2 diabetes mellitus with peripheral neuropathy (HCC)   Renal transplant recipient   Dyslipidemia   Pressure injury of skin   AKI (acute kidney injury) ([place]) on CKD 3b/4   Pleural effusion due to CHF (congestive heart failure) (HCC)   Acute heart failure with preserved ejection fraction (HFpEF) (HCC)   Generalized weakness due to deconditioning Intractable nausea and vomiting -improved IV fluids to transition to p.o. as able / pending AKI and nephro recs  as needed antiemetics  IV PPI therapy with Protonix. PT/OT --> PT recs SNF  Acute Kidney Injury on CKD 3b/4 likely d/t dehydration - improving  Consult nephrology 10/19 --> continue IV fluids 10/20-10/21 Cr trending down 10/20 pleural edema/effusion --> thoracentesis yielded 350 mL, reduced rate IV fluids appreciate further nephrology recs --> lasix given x1 10/21-10/22 Creatinine continues to improve, per nephrology via secure chat keep through tonight on LR 75 mL/h and recheck in a.m.  Clinical CHF w/o history - HFpEF Heart size mildly enlarged, acute pulmonary edema, pulmonary effusion likely due to IV fluid resuscitation to treat issues as above BNP --> slight elevation, CKD may be contributing Echo --> LVEF 60 to 65%, normal LV function, no RWMA,(+) LVH, diastolic function could not be evaluated, RV systolic function moderately reduced, RV mildly enlarged.  LA/RA severely dilated.  Small pericardial effusion. Reduced IV fluids rate, AKI in hx renal transplant patient, nephrology  following --> received 1 dose diuresis yesterday Consider cardiology consult, pending BNP/Echo and nephro recs -->  symptoms improving and echo is not showing severe abnormality, can follow outpatient Diuretics as needed/per nephrology  Transudative Pleural effusion Most likely due to HFpEF Status post thoracentesis with removal of 350 mL On follow-up CXR, question skinfold versus mild pneumothorax on imaging, monitor respiratory status  Atrial fibrillation - stable continue flecainide, Cardizem CD. continue Eliquis. as needed IV Cardizem for rapid rate.  Type 2 diabetes mellitus with peripheral neuropathy (HCC) Hypoglycemia - resolved Restarted insulin sensitive sliding scale    Renal transplant recipient continue Prograf and CellCept as well as prednisone.  Dyslipidemia continue statin therapy and Zetia.    DVT prophylaxis: Patient is on Eliquis for A-fib Pertinent IV fluids/nutrition: IV fluids LR 75 mL/h. Central lines / invasive devices: None  Code Status: Full code Family Communication: Updated husband yesterday  Disposition: observation --> inpatient TOC needs: SNF placement  Barriers to discharge / significant pending items: Per nephrology, if creatinine continuing to improve tomorrow can hopefully DC IV fluids and discharge to SNF

## 2022-04-26 NOTE — Progress Notes (Signed)
Hypoglycemic Event  CBG: 37 @ 0800 04/26/22  Treatment: 8 oz juice/soda  Symptoms: Sweaty and Hungry  Follow-up CBG: Time:0820 CBG Result:47  Possible Reasons for Event: Vomiting  Comments/MD notified: Dr. Sheppard Coil notified. Patient is A&Ox4 and taking in fluids and breakfast tray; no acute distress. See orders.    Amond Speranza  Danelle Earthly

## 2022-04-26 NOTE — ED Notes (Signed)
Pt placed on purewick 

## 2022-04-26 NOTE — Progress Notes (Addendum)
PROGRESS NOTE    Jean Johnson   KVQ:[id number] DOB: 1950/12/04  DOA: 04/25/2022 Date of Service: 04/26/22 PCP: Gladstone Lighter, MD     Brief Narrative / Hospital Course:  Jean Johnson is a 71 y.o. African-American female with medical history significant for paroxysmal atrial fibrillation on Eliquis, type 2 diabetes mellitus with diabetic neuropathy, end-stage renal disease, s/p renal transplant twice in 2000 and 2010, dyslipidemia and hypertension, who presented to the emergency room with acute onset of intractable nausea and vomiting with associated generalized weakness.  She was initially admitted here and discharged two days ago during which she was managed for COVID-19, acute on chronic diastolic CHF and AKI, on that admission she refused SNF placement on discharge..  She continued to feel weak after discharge and has been having nausea with recurrent vomiting as mentioned above.  She is occasionally able to keep fluids down.  Has been very difficult to keep solids down.   10/16: admitted for IV fluids and IV antiemetics. SNF placement pending.  10/17: Significantly hypoglycemic this morning, corrected with D50.  TOC pending. Maintains on IV fluids. PT eval pending.   Consultants:  none  Procedures: none      ASSESSMENT & PLAN:   Principal Problem:   Intractable nausea and vomiting Active Problems:   Atrial fibrillation with rapid ventricular response (HCC)   Type 2 diabetes mellitus with peripheral neuropathy ([place])   Renal transplant recipient   Dyslipidemia   Generalized weakness due to deconditioning Intractable nausea and vomiting -improved IV fluids to transition to p.o. as needed antiemetics will be provided. IV PPI therapy with Protonix. Case management consult to be obtained for arrangement for rehabilitation placement. PT/OT pending  Atrial fibrillation  continue flecainide Cardizem CD. continue Eliquis. as needed IV Cardizem as needed  for rapid rate.  Type 2 diabetes mellitus with peripheral neuropathy (HCC) Hypoglycemia Holding insulin for now  Renal transplant recipient continue Prograf and CellCept as well as prednisone.  Dyslipidemia continue statin therapy and Zetia.    DVT prophylaxis: Patient is on Eliquis for A-fib Pertinent IV fluids/nutrition: IV fluids discontinued now that patient is taking p.o. Central lines / invasive devices: None  Code Status: Full code Family Communication: None at this time  Disposition: observation --> inpatient TOC needs: SNF placement pending PT eval Barriers to discharge / significant pending items: Monitoring glucose, SNF placement pending             Subjective:  Patient reports feeling tired, weak, "crappy," but does not really elaborate much on this.  States she feels about the same as when she came in but notably she is eating now, breakfast tray about half finished.  Denies abdominal pain, denies nausea, denies CP/SOB       Objective:  Vitals:   04/26/22 1400 04/26/22 1402 04/26/22 1438 04/26/22 1639  BP:   (!) 144/72 124/60  Pulse: 80  75 73  Resp: (!) 21 19 10 12   Temp:   (!) 97.5 F (36.4 C) 99.3 F (37.4 C)  TempSrc:   Oral   SpO2: 99%  96% 98%  Weight:      Height:        Intake/Output Summary (Last 24 hours) at 04/26/2022 1737 Last data filed at 04/26/2022 1051 Gross per 24 hour  Intake 1504.17 ml  Output --  Net 1504.17 ml   Filed Weights   04/25/22 1658  Weight: 50.8 kg    Examination:  Constitutional:  VS as above  General Appearance: alert, frail but not cachectic, NAD Respiratory: Normal respiratory effort No wheeze No rhonchi No rales Cardiovascular: S1/S2 normal +murmur Irreg/irreg rhythm Reg rate WNL No rub/gallop auscultated No lower extremity edema Gastrointestinal: No tenderness Musculoskeletal:  No clubbing/cyanosis of digits Symmetrical movement in all extremities Neurological: No cranial  nerve deficit on limited exam Alert Psychiatric: Normal judgment/insight Normal mood and affect       Scheduled Medications:   apixaban  5 mg Oral BID   atorvastatin  10 mg Oral Daily   cyanocobalamin  2,000 mcg Oral Daily   dextrose  1 ampule Intravenous Once   diltiazem  240 mg Oral QHS   ezetimibe  10 mg Oral Daily   flecainide  100 mg Oral Q12H   ipratropium-albuterol  3 mL Nebulization Q6H   mycophenolate  500 mg Oral BID   pantoprazole (PROTONIX) IV  40 mg Intravenous Q12H   [START ON 04/27/2022] predniSONE  5 mg Oral Q breakfast   pregabalin  50 mg Oral BID   tacrolimus  3 mg Oral BID    Continuous Infusions:  lactated ringers       PRN Medications:  acetaminophen **OR** acetaminophen, guaiFENesin, magnesium hydroxide, meclizine, ondansetron **OR** ondansetron (ZOFRAN) IV, traZODone  Antimicrobials:  Anti-infectives (From admission, onward)    None       Data Reviewed: I have personally reviewed following labs and imaging studies  CBC: Recent Labs  Lab 04/25/22 1222 04/26/22 0823  WBC 10.1 7.6  HGB 11.7* 8.6*  HCT 38.2 28.0*  MCV 97.7 98.6  PLT 232 638*   Basic Metabolic Panel: Recent Labs  Lab 04/25/22 1222 04/25/22 1249 04/26/22 0823  NA 143  --  141  K 3.7  --  3.9  CL 112*  --  112*  CO2 22  --  15*  GLUCOSE 76  --  36*  BUN 25*  --  29*  CREATININE 1.72*  --  1.49*  CALCIUM 8.8*  --  8.1*  MG  --  2.1  --    GFR: Estimated Creatinine Clearance: 24.2 mL/min (A) (by C-G formula based on SCr of 1.49 mg/dL (H)). Liver Function Tests: No results for input(s): "AST", "ALT", "ALKPHOS", "BILITOT", "PROT", "ALBUMIN" in the last 168 hours. No results for input(s): "LIPASE", "AMYLASE" in the last 168 hours. No results for input(s): "AMMONIA" in the last 168 hours. Coagulation Profile: No results for input(s): "INR", "PROTIME" in the last 168 hours. Cardiac Enzymes: No results for input(s): "CKTOTAL", "CKMB", "CKMBINDEX", "TROPONINI" in  the last 168 hours. BNP (last 3 results) No results for input(s): "PROBNP" in the last 8760 hours. HbA1C: No results for input(s): "HGBA1C" in the last 72 hours. CBG: Recent Labs  Lab 04/26/22 0800 04/26/22 0825 04/26/22 0923 04/26/22 1231 04/26/22 1645  GLUCAP 37* 42* 71 153* 72   Lipid Profile: No results for input(s): "CHOL", "HDL", "LDLCALC", "TRIG", "CHOLHDL", "LDLDIRECT" in the last 72 hours. Thyroid Function Tests: No results for input(s): "TSH", "T4TOTAL", "FREET4", "T3FREE", "THYROIDAB" in the last 72 hours. Anemia Panel: No results for input(s): "VITAMINB12", "FOLATE", "FERRITIN", "TIBC", "IRON", "RETICCTPCT" in the last 72 hours. Urine analysis:    Component Value Date/Time   COLORURINE STRAW (A) 04/17/2022 1406   APPEARANCEUR CLEAR (A) 04/17/2022 1406   APPEARANCEUR Hazy (A) 11/16/2020 1344   LABSPEC 1.012 04/17/2022 1406   PHURINE 5.0 04/17/2022 1406   GLUCOSEU NEGATIVE 04/17/2022 1406   HGBUR MODERATE (A) 04/17/2022 1406   BILIRUBINUR NEGATIVE 04/17/2022 1406  BILIRUBINUR Negative 11/16/2020 1344   KETONESUR NEGATIVE 04/17/2022 1406   PROTEINUR 100 (A) 04/17/2022 1406   NITRITE NEGATIVE 04/17/2022 1406   LEUKOCYTESUR NEGATIVE 04/17/2022 1406   Sepsis Labs: @LABRCNTIP (procalcitonin:4,lacticidven:4)  No results found for this or any previous visit (from the past 240 hour(s)).       Radiology Studies: DG Chest 2 View  Result Date: 04/25/2022 CLINICAL DATA:  Weakness EXAM: CHEST - 2 VIEW COMPARISON:  04/17/2022, 04/14/2022 FINDINGS: Small bilateral pleural effusions. Cardiomegaly with vascular congestion. Overall improved aeration of left chest compared with 04/17/2022. No pneumothorax. Aortic atherosclerosis. IMPRESSION: Cardiomegaly with small bilateral effusions and central congestion. Overall improved aeration of left thorax compared with 04/17/2022 Electronically Signed   By: Donavan Foil M.D.   On: 04/25/2022 17:52            LOS: 0 days        Emeterio Reeve, DO [facility] 04/26/2022, 5:37 PM   Staff may message me via secure chat in [place]  but this may not receive immediate response,  please page for urgent matters!  If 7PM-7AM, please contact night-coverage [email or web address]  Dictation software was used to generate the above note. Typos may occur and escape review, as with typed/written notes. Please contact Dr Sheppard Coil directly for clarity if needed.

## 2022-04-27 DIAGNOSIS — L8990 Pressure ulcer of unspecified site, unspecified stage: Secondary | ICD-10-CM | POA: Diagnosis present

## 2022-04-27 DIAGNOSIS — N179 Acute kidney failure, unspecified: Secondary | ICD-10-CM

## 2022-04-27 DIAGNOSIS — R112 Nausea with vomiting, unspecified: Secondary | ICD-10-CM | POA: Diagnosis not present

## 2022-04-27 LAB — CBC
HCT: 29.3 % — ABNORMAL LOW (ref 36.0–46.0)
Hemoglobin: 9.2 g/dL — ABNORMAL LOW (ref 12.0–15.0)
MCH: 30 pg (ref 26.0–34.0)
MCHC: 31.4 g/dL (ref 30.0–36.0)
MCV: 95.4 fL (ref 80.0–100.0)
Platelets: 169 10*3/uL (ref 150–400)
RBC: 3.07 MIL/uL — ABNORMAL LOW (ref 3.87–5.11)
RDW: 14.1 % (ref 11.5–15.5)
WBC: 7.5 10*3/uL (ref 4.0–10.5)
nRBC: 0 % (ref 0.0–0.2)

## 2022-04-27 LAB — GLUCOSE, CAPILLARY
Glucose-Capillary: 91 mg/dL (ref 70–99)
Glucose-Capillary: 164 mg/dL — ABNORMAL HIGH (ref 70–99)
Glucose-Capillary: 191 mg/dL — ABNORMAL HIGH (ref 70–99)
Glucose-Capillary: 226 mg/dL — ABNORMAL HIGH (ref 70–99)

## 2022-04-27 LAB — BASIC METABOLIC PANEL
Anion gap: 5 (ref 5–15)
BUN: 31 mg/dL — ABNORMAL HIGH (ref 8–23)
CO2: 22 mmol/L (ref 22–32)
Calcium: 8.1 mg/dL — ABNORMAL LOW (ref 8.9–10.3)
Chloride: 114 mmol/L — ABNORMAL HIGH (ref 98–111)
Creatinine, Ser: 2.42 mg/dL — ABNORMAL HIGH (ref 0.44–1.00)
GFR, Estimated: 21 mL/min — ABNORMAL LOW (ref >60)
Glucose, Bld: 120 mg/dL — ABNORMAL HIGH (ref 70–99)
Potassium: 3.9 mmol/L (ref 3.5–5.1)
Sodium: 141 mmol/L (ref 135–145)

## 2022-04-27 MED ORDER — ALBUTEROL SULFATE HFA 108 (90 BASE) MCG/ACT IN AERS: 2.0000 | INHALATION_SPRAY | Freq: Four times a day (QID) | RESPIRATORY_TRACT | Status: DC | PRN

## 2022-04-27 NOTE — TOC Initial Note (Signed)
Transition of Care [facility]) - Initial/Assessment Note    Patient Details  Name: Jean Johnson MRN: [id number] Date of Birth: 1951-01-19  Transition of Care [facility]) CM/SW Contact:    Beverly Sessions, RN Phone Number: 04/27/2022, 3:41 PM  Clinical Narrative:                  Admitted for: nausea and vomiting  Admitted from: home with husband [name] Current home health/prior home health/DME: Rollator, RW, power WC and cane  Therapy recommending SNF Patient agreeable for bed search.   Existing Passr Fl2 sent for signature Bed search initiated  Covid positive on 10/5   Expected Discharge Plan: [place] Barriers to Discharge: Continued Medical Work up   Patient Goals and CMS Choice Patient states their goals for this hospitalization and ongoing recovery are:: to fele better CMS Medicare.gov Compare Post Acute Care list provided to:: Patient    Expected Discharge Plan and Services Expected Discharge Plan: [place]       Living arrangements for the past 2 months: Single Family Home                                      Prior Living Arrangements/Services Living arrangements for the past 2 months: Single Family Home Lives with:: Spouse              Current home services: DME, Home PT    Activities of Daily Living Home Assistive Devices/Equipment: Environmental consultant (specify type) ADL Screening (condition at time of admission) Patient's cognitive ability adequate to safely complete daily activities?: Yes Is the patient deaf or have difficulty hearing?: No Does the patient have difficulty seeing, even when wearing glasses/contacts?: Yes Does the patient have difficulty concentrating, remembering, or making decisions?: No Patient able to express need for assistance with ADLs?: Yes Does the patient have difficulty dressing or bathing?: Yes Independently performs ADLs?: Yes (appropriate for developmental age) Does the patient  have difficulty walking or climbing stairs?: Yes Weakness of Legs: Both Weakness of Arms/Hands: Both  Permission Sought/Granted                  Emotional Assessment              Admission diagnosis:  Generalized weakness [R53.1] Intractable nausea and vomiting [R11.2] Renal transplant recipient [Z94.0] Patient Active Problem List   Diagnosis Date Noted   Pressure injury of skin 04/27/2022   AKI (acute kidney injury) ([place]) on CKD 3b/4 04/27/2022   Intractable nausea and vomiting 04/25/2022   Atrial fibrillation with rapid ventricular response ([place]) 04/25/2022   Type 2 diabetes mellitus with peripheral neuropathy ([place]) 04/25/2022   Renal transplant recipient 04/25/2022   Dyslipidemia 04/25/2022   Generalized weakness    Acute on chronic diastolic CHF (congestive heart failure) ([place]) 51/70/0174   Metabolic acidosis 94/49/6759   Acute urinary retention 04/18/2022   Syncope 04/14/2022   Atrial fibrillation, chronic ([place]) 04/14/2022   Type II diabetes mellitus with renal manifestations ([place]) 04/14/2022   HLD (hyperlipidemia)    Hypokalemia    Acute bronchitis due to COVID-19 virus    Acute renal failure superimposed on stage 3a chronic kidney disease ([place])    Myocardial injury    Hypoglycemia    Cervical myelopathy ([place]) 03/24/2021   Anemia 12/03/2020   Essential hypertension 12/03/2020   Hypothyroidism 12/03/2020   Type 2 diabetes mellitus ([place]) 12/03/2020  Atrial fibrillation ([place]) 09/21/2018   Kidney transplant status 09/21/2018   Osteoporosis 09/21/2018   PCP:  Gladstone Lighter, MD Pharmacy:   [id number] - [place], [address] [place] [address] Phone: [phone] Fax: [phone]     Social Determinants of Health (SDOH) Interventions    Readmission Risk Interventions    04/27/2022    3:34 PM  Readmission Risk Prevention Plan  Transportation Screening Complete  HRI or [place] Complete  Social Work  Consult for [place] Planning/Counseling Complete  Palliative Care Screening Not Applicable  Medication Review Press photographer) Complete

## 2022-04-27 NOTE — Progress Notes (Addendum)
PROGRESS NOTE    Ita Fritzsche   UTM:[id number] DOB: 1951/03/12  DOA: 04/25/2022 Date of Service: 04/27/22 PCP: Gladstone Lighter, MD     Brief Narrative / Hospital Course:  Jean Johnson is a 71 y.o. 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  She was initially admitted here and discharged two days ago during which she was managed for COVID-19, acute on chronic diastolic CHF and AKI, on that admission she refused SNF placement on discharge..  She continued to feel weak after discharge and has been having nausea with recurrent vomiting as mentioned above.  She is occasionally able to keep fluids down.  Has been very difficult to keep solids down.   10/16: admitted for IV fluids and IV antiemetics. SNF placement pending.  10/17: Significantly hypoglycemic this morning, corrected with D50.  TOC pending. Maintains on IV fluids. PT eval pending.  10/18: PT recommending SNF, placement pending. AKI, keep on fluids   Consultants:  none  Procedures: none      ASSESSMENT & PLAN:   Principal Problem:   Intractable nausea and vomiting Active Problems:   Atrial fibrillation with rapid ventricular response (HCC)   Type 2 diabetes mellitus with peripheral neuropathy ([place])   Renal transplant recipient   Dyslipidemia   Generalized weakness due to deconditioning Intractable nausea and vomiting -improved IV fluids to transition to p.o. as able as needed antiemetics will be provided. IV PPI therapy with Protonix. Case management consult to be obtained for arrangement for rehabilitation placement. PT/OT --> PT recs  SNF  Acute Kidney Injury on CKD 3b/4 Continue fluids   Atrial fibrillation  continue flecainide, Cardizem CD. continue Eliquis. as needed IV Cardizem as needed for rapid rate.  Type 2 diabetes mellitus with peripheral neuropathy (HCC) Hypoglycemia Holding insulin for now  Renal transplant recipient continue Prograf and CellCept as well as prednisone.  Dyslipidemia continue statin therapy and Zetia.    DVT prophylaxis: Patient is on Eliquis for A-fib Pertinent IV fluids/nutrition: IV fluids 75 mL/h. Central lines / invasive devices: None  Code Status: Full code Family Communication: None at this time  Disposition: observation --> inpatient TOC needs: SNF placement  Barriers to discharge / significant pending items: Monitoring glucose, SNF placement pending             Subjective:  Patient reports feeling tired, weak, "crappy," about the same as yesterday, no new complaints, no CP/SOB       Objective:  Vitals:   04/26/22 1639 04/26/22 2103 04/27/22 0415 04/27/22 0808  BP: 124/60 (!) 122/46 (!) 104/50 (!) 112/50  Pulse: 73 72 62 61  Resp: 12 18 18 18   Temp: 99.3 F (37.4 C) 98.9 F (37.2 C) 99.5 F (37.5 C) 100 F (37.8 C)  TempSrc:  Oral Oral Oral  SpO2: 98% 99% 99% 93%  Weight:      Height:        Intake/Output Summary (Last 24 hours) at 04/27/2022 1325 Last data filed at 04/27/2022 1020 Gross per 24 hour  Intake 249.12 ml  Output --  Net 249.12 ml   Filed Weights   04/25/22 1658  Weight: 50.8 kg    Examination:  Constitutional:  VS as  above General Appearance: alert, frail but not cachectic, NAD Respiratory: Normal respiratory effort No wheeze No rhonchi No rales Cardiovascular: S1/S2 normal +murmur Irreg/irreg rhythm Reg rate WNL No rub/gallop auscultated No lower extremity edema Gastrointestinal: No tenderness Musculoskeletal:  No clubbing/cyanosis of digits Symmetrical movement in all  extremities Neurological: No cranial nerve deficit on limited exam Alert Psychiatric: Normal judgment/insight Normal mood and affect       Scheduled Medications:   apixaban  5 mg Oral BID   atorvastatin  10 mg Oral Daily   cyanocobalamin  2,000 mcg Oral Daily   dextrose  1 ampule Intravenous Once   diltiazem  240 mg Oral QHS   ezetimibe  10 mg Oral Daily   flecainide  100 mg Oral Q12H   mycophenolate  500 mg Oral BID   pantoprazole (PROTONIX) IV  40 mg Intravenous Q12H   predniSONE  5 mg Oral Q breakfast   pregabalin  50 mg Oral BID   tacrolimus  3 mg Oral BID    Continuous Infusions:  lactated ringers 75 mL/hr at 04/27/22 0541     PRN Medications:  acetaminophen **OR** acetaminophen, albuterol, guaiFENesin, magnesium hydroxide, meclizine, ondansetron **OR** ondansetron (ZOFRAN) IV, traZODone  Antimicrobials:  Anti-infectives (From admission, onward)    None       Data Reviewed: I have personally reviewed following labs and imaging studies  CBC: Recent Labs  Lab 04/25/22 1222 04/26/22 0823 04/27/22 0528  WBC 10.1 7.6 7.5  HGB 11.7* 8.6* 9.2*  HCT 38.2 28.0* 29.3*  MCV 97.7 98.6 95.4  PLT 232 140* 161   Basic Metabolic Panel: Recent Labs  Lab 04/25/22 1222 04/25/22 1249 04/26/22 0823 04/27/22 0528  NA 143  --  141 141  K 3.7  --  3.9 3.9  CL 112*  --  112* 114*  CO2 22  --  15* 22  GLUCOSE 76  --  36* 120*  BUN 25*  --  29* 31*  CREATININE 1.72*  --  1.49* 2.42*  CALCIUM 8.8*  --  8.1* 8.1*  MG  --  2.1  --   --    GFR: Estimated Creatinine Clearance: 14.9 mL/min (A) (by C-G formula based on SCr of 2.42 mg/dL (H)). Liver Function Tests: No results for input(s): "AST", "ALT", "ALKPHOS", "BILITOT", "PROT", "ALBUMIN" in the last 168 hours. No results for input(s): "LIPASE", "AMYLASE" in the last 168 hours. No results for input(s): "AMMONIA" in the last 168 hours. Coagulation Profile: No results for input(s): "INR", "PROTIME" in the last  168 hours. Cardiac Enzymes: No results for input(s): "CKTOTAL", "CKMB", "CKMBINDEX", "TROPONINI" in the last 168 hours. BNP (last 3 results) No results for input(s): "PROBNP" in the last 8760 hours. HbA1C: No results for input(s): "HGBA1C" in the last 72 hours. CBG: Recent Labs  Lab 04/26/22 1231 04/26/22 1645 04/26/22 2028 04/27/22 0809 04/27/22 1136  GLUCAP 153* 72 114* 91 164*   Lipid Profile: No results for input(s): "CHOL", "HDL", "LDLCALC", "TRIG", "CHOLHDL", "LDLDIRECT" in the last 72 hours. Thyroid Function Tests: No results for input(s): "TSH", "T4TOTAL", "FREET4", "T3FREE", "THYROIDAB" in the last 72 hours. Anemia Panel: No results for input(s): "VITAMINB12", "FOLATE", "FERRITIN", "TIBC", "IRON", "RETICCTPCT" in the last 72 hours. Urine analysis:    Component Value Date/Time   COLORURINE STRAW (A) 04/17/2022 1406   APPEARANCEUR CLEAR (A) 04/17/2022 1406   APPEARANCEUR Hazy (A) 11/16/2020 1344   LABSPEC 1.012 04/17/2022 1406   PHURINE 5.0 04/17/2022 1406   GLUCOSEU NEGATIVE 04/17/2022 1406  HGBUR MODERATE (A) 04/17/2022 1406   BILIRUBINUR NEGATIVE 04/17/2022 1406   BILIRUBINUR Negative 11/16/2020 1344   KETONESUR NEGATIVE 04/17/2022 1406   PROTEINUR 100 (A) 04/17/2022 1406   NITRITE NEGATIVE 04/17/2022 1406   LEUKOCYTESUR NEGATIVE 04/17/2022 1406   Sepsis Labs: @LABRCNTIP (procalcitonin:4,lacticidven:4)  No results found for this or any previous visit (from the past 240 hour(s)).       Radiology Studies: DG Chest 2 View  Result Date: 04/25/2022 CLINICAL DATA:  Weakness EXAM: CHEST - 2 VIEW COMPARISON:  04/17/2022, 04/14/2022 FINDINGS: Small bilateral pleural effusions. Cardiomegaly with vascular congestion. Overall improved aeration of left chest compared with 04/17/2022. No pneumothorax. Aortic atherosclerosis. IMPRESSION: Cardiomegaly with small bilateral effusions and central congestion. Overall improved aeration of left thorax compared with 04/17/2022  Electronically Signed   By: Donavan Foil M.D.   On: 04/25/2022 17:52            LOS: 1 day       Emeterio Reeve, DO [facility] 04/27/2022, 1:25 PM   Staff may message me via secure chat in [place]  but this may not receive immediate response,  please page for urgent matters!  If 7PM-7AM, please contact night-coverage [email or web address]  Dictation software was used to generate the above note. Typos may occur and escape review, as with typed/written notes. Please contact Dr Sheppard Coil directly for clarity if needed.

## 2022-04-27 NOTE — Evaluation (Signed)
Occupational Therapy Evaluation Patient Details Name: Jean Johnson MRN: [id number] DOB: May 05, 1951 Today's Date: 04/27/2022   History of Present Illness is a 71 y.o. African-American female with medical history significant for paroxysmal atrial fibrillation on Eliquis, type 2 diabetes mellitus with diabetic neuropathy, end-stage renal disease, s/p renal transplant twice in 2000 and 2010, dyslipidemia and hypertension, who presented to the emergency room with acute onset of intractable nausea and vomiting with associated generalized weakness. noted for recent hospital admission.   Clinical Impression   Patient presenting with decreased independence in self-care, functional mobility, safety awareness, and strength/endurance. Patient  reports she lives at home with her spouse who can provide 24/7 assistance at discharge. Patient lives in a 2 level home, has a SPC, RW, rollator, and power wheelchair. At baseline her husband assists with stairs, used power wheelchair outside, uses the rollator downstairs, and RW upstairs. Upon arrival to room patient observed with left lateral lean, requiring assistance to reposition herself. Poor safety awareness and sitting balance. Patient currently functioning at max A +2 for transfer sit <>stand. Patient required manual facilitation to stand upright; right knee buckling while standing. Patient unable to take any steps. Orthostatics monitored during session: (Sitting) 109/53, Map: 70, HR: 57. (Standing) BP: 124/52, Map: 75, HR:67. Squat pivot completed with right knee blocking, with Max A from recliner to bed. Min A for bed mobility EOB> supine. Patient left in bed with bed alarm set, call bell in reach, nurse in room, and all needs met. Patient will benefit from acute OT to increase overall independence in the areas of ADLs, functional mobility, in order to safely discharge to the next venue of care.       Recommendations for follow up therapy are one component  of a multi-disciplinary discharge planning process, led by the attending physician.  Recommendations may be updated based on patient status, additional functional criteria and insurance authorization.   Follow Up Recommendations  Skilled nursing-short term rehab (<3 hours/day)    Assistance Recommended at Discharge Frequent or constant Supervision/Assistance  Patient can return home with the following A lot of help with walking and/or transfers;A lot of help with bathing/dressing/bathroom;Assist for transportation;Help with stairs or ramp for entrance;Assistance with cooking/housework    Functional Status Assessment  Patient has had a recent decline in their functional status and demonstrates the ability to make significant improvements in function in a reasonable and predictable amount of time.  Equipment Recommendations  Other (comment) (Defer to next venue of care.)       Precautions / Restrictions Precautions Precautions: Fall Restrictions Weight Bearing Restrictions: No      Mobility Bed Mobility   Bed Mobility: Sit to Supine       Sit to supine: Min assist        Transfers Overall transfer level: Needs assistance Equipment used: Rolling walker (2 wheels), None Transfers: Sit to/from Stand, Bed to chair/wheelchair/BSC Sit to Stand: +2 safety/equipment, Max assist   Squat pivot transfers: Max assist              Balance         Postural control: Left lateral lean Standing balance support: Bilateral upper extremity supported, During functional activity, Reliant on assistive device for balance Standing balance-Leahy Scale: Zero Standing balance comment: Required manual facilitation to stand upright; right knee buckling, very unsteady.                           ADL  either performed or assessed with clinical judgement      Vision Baseline Vision/History: 1 Wears glasses              Pertinent Vitals/Pain Pain Assessment Pain Assessment:  No/denies pain     Hand Dominance Right   Extremity/Trunk Assessment Upper Extremity Assessment Upper Extremity Assessment: Generalized weakness   Lower Extremity Assessment Lower Extremity Assessment: Generalized weakness   Cervical / Trunk Assessment Cervical / Trunk Assessment: Kyphotic   Communication Communication Communication: No difficulties   Cognition Arousal/Alertness: Awake/alert Behavior During Therapy: WFL for tasks assessed/performed, Restless Overall Cognitive Status: Within Functional Limits for tasks assessed                                 General Comments: Pt reported feeling "weak" today                Home Living Family/patient expects to be discharged to:: Private residence Living Arrangements: Spouse/significant other Available Help at Discharge: Family;Available 24 hours/day Type of Home: House Home Access: Level entry     Home Layout: Bed/bath upstairs;1/2 bath on main level;Two level Alternate Level Stairs-Number of Steps: 14 Alternate Level Stairs-Rails: Left Bathroom Shower/Tub: Tub/shower unit   Bathroom Toilet: Standard     Home Equipment: Rollator (4 wheels);Rolling Walker (2 wheels);Wheelchair - power;Cane - single point          Prior Functioning/Environment Prior Level of Function : Needs assist       Physical Assist : Mobility (physical) Mobility (physical): Stairs;Transfers;Gait   Mobility Comments: husband assists with stairs. power w/c outside, 4WW downstairs, 2WW upstairs ADLs Comments: assist for IADLs        OT Problem List: Decreased strength;Decreased activity tolerance;Impaired balance (sitting and/or standing);Decreased safety awareness      OT Treatment/Interventions: Self-care/ADL training;Therapeutic exercise;Energy conservation;DME and/or AE instruction;Therapeutic activities;Balance training;Patient/family education    OT Goals(Current goals can be found in the care plan section)  Acute Rehab OT Goals Patient Stated Goal: to get stronger OT Goal Formulation: With patient Time For Goal Achievement: 05/11/22 Potential to Achieve Goals: Good ADL Goals Pt Will Perform Lower Body Bathing: with min assist Pt Will Perform Lower Body Dressing: with min assist Pt Will Transfer to Toilet: with min assist Pt Will Perform Toileting - Clothing Manipulation and hygiene: with min assist  OT Frequency: Min 2X/week       AM-PAC OT "6 Clicks" Daily Activity     Outcome Measure Help from another person eating meals?: None Help from another person taking care of personal grooming?: A Little Help from another person toileting, which includes using toliet, bedpan, or urinal?: A Lot Help from another person bathing (including washing, rinsing, drying)?: A Lot Help from another person to put on and taking off regular upper body clothing?: A Little Help from another person to put on and taking off regular lower body clothing?: A Lot 6 Click Score: 16   End of Session Equipment Utilized During Treatment: Rolling walker (2 wheels);Other (comment) (none) Nurse Communication: Mobility status  Activity Tolerance: Patient limited by fatigue;Patient limited by lethargy Patient left: in bed;with call bell/phone within reach;with bed alarm set;with nursing/sitter in room  OT Visit Diagnosis: Other abnormalities of gait and mobility (R26.89);Muscle weakness (generalized) (M62.81)                Time: 1104-1132 OT Time Calculation (min): 28 min Charges:  OT General Charges $OT Visit: 1   Visit OT Evaluation $OT Eval Moderate Complexity: 1 Mod OT Treatments $Therapeutic Activity: 8-22 mins   Yandiel Bergum, OTS 04/27/2022, 12:39 PM

## 2022-04-27 NOTE — Evaluation (Signed)
Physical Therapy Evaluation Patient Details Name: Jean Johnson MRN: [id number] DOB: [birth year] Today's Date: 04/27/2022  History of Present Illness  is a 71 y.o. African-American female with medical history significant for paroxysmal atrial fibrillation on Eliquis, type 2 diabetes mellitus with diabetic neuropathy, end-stage renal disease, s/p renal transplant twice in 2000 and 2010, dyslipidemia and hypertension, who presented to the emergency room with acute onset of intractable nausea and vomiting with associated generalized weakness. noted for recent hospital admission.   Clinical Impression  Pt alert, agreeable to PT, very challenged by balance and vision this session. Stated she normally wears glasses all the time. Prior to recent hospital admissions, pt was living at home with her husband, ambulatory with AD, husband assists with stairs and IADLs.  The patient required maxA to transfer to EOB despite extra time and education on sequencing. Noted to have R lateral lean throughout most of session, pt endorsed dizziness (chronic) with all mobility attempts. May benefit from orthostatic assessment; pt requested to transfer to [place] stand pivot to complete. From [facility] pt maxAx2 with RW, unable to take any steps this AM, significant assistance to sit safely in recliner.  Overall the patient demonstrated deficits (see "PT Problem List") that impede the patient's functional abilities, safety, and mobility and would benefit from skilled PT intervention. Recommendation at this time is SNF due to current level of assistance needed and change from PLOF.        Recommendations for follow up therapy are one component of a multi-disciplinary discharge planning process, led by the attending physician.  Recommendations may be updated based on patient status, additional functional criteria and insurance authorization.  Follow Up Recommendations Skilled nursing-short term rehab (<3 hours/day) Can  patient physically be transported by private vehicle: No    Assistance Recommended at Discharge Frequent or constant Supervision/Assistance  Patient can return home with the following  A lot of help with walking and/or transfers;Assistance with cooking/housework;A lot of help with bathing/dressing/bathroom;Direct supervision/assist for medications management;Assist for transportation;Help with stairs or ramp for entrance    Equipment Recommendations Other (comment) (TBD)  Recommendations for Other Services       Functional Status Assessment Patient has had a recent decline in their functional status and demonstrates the ability to make significant improvements in function in a reasonable and predictable amount of time.     Precautions / Restrictions Precautions Precautions: Fall Restrictions Weight Bearing Restrictions: No      Mobility  Bed Mobility Overal bed mobility: Needs Assistance Bed Mobility: Supine to Sit     Supine to sit: Max assist, HOB elevated          Transfers Overall transfer level: Needs assistance Equipment used: Rolling walker (2 wheels), None Transfers: Sit to/from Stand, Bed to chair/wheelchair/BSC Sit to Stand: Max assist, +2 safety/equipment Stand pivot transfers: Max assist              Ambulation/Gait Ambulation/Gait assistance: Max assist, +2 safety/equipment Gait Distance (Feet): 1 Feet Assistive device: Rolling walker (2 wheels)         General Gait Details: unable to truly ambulate, limited by fatigue and difficulty with LLE lift  Stairs            Wheelchair Mobility    Modified Rankin (Stroke Patients Only)       Balance Overall balance assessment: Needs assistance Sitting-balance support: Feet supported, Bilateral upper extremity supported Sitting balance-Leahy Scale: Poor Sitting balance - Comments: R lateral lean noted throughout most of  sitting   Standing balance support: Bilateral upper extremity  supported, During functional activity, Reliant on assistive device for balance Standing balance-Leahy Scale: Zero                               Pertinent Vitals/Pain Pain Assessment Pain Assessment: No/denies pain    Home Living Family/patient expects to be discharged to:: Private residence Living Arrangements: Spouse/significant other Available Help at Discharge: Family;Available 24 hours/day Type of Home: House Home Access: Level entry     Alternate Level Stairs-Number of Steps: 14 Home Layout: Bed/bath upstairs;1/2 bath on main level;Two level Home Equipment: Rollator (4 wheels);Rolling Walker (2 wheels);Wheelchair - power;Cane - single point      Prior Function Prior Level of Function : Needs assist             Mobility Comments: husband assists with stairs. power w/c outside, 4WW downstairs, 2WW upstairs ADLs Comments: assist for IADLs     Hand Dominance   Dominant Hand: Right    Extremity/Trunk Assessment   Upper Extremity Assessment Upper Extremity Assessment: Generalized weakness    Lower Extremity Assessment Lower Extremity Assessment: Generalized weakness    Cervical / Trunk Assessment Cervical / Trunk Assessment: Kyphotic  Communication   Communication: No difficulties  Cognition Arousal/Alertness: Awake/alert Behavior During Therapy: WFL for tasks assessed/performed                                            General Comments      Exercises Other Exercises Other Exercises: stand pivot to BSC to void, pt reported unable.   Assessment/Plan    PT Assessment Patient needs continued PT services  PT Problem List Decreased strength;Decreased activity tolerance;Decreased balance;Decreased mobility;Decreased knowledge of use of DME;Decreased safety awareness;Decreased knowledge of precautions       PT Treatment Interventions DME instruction;Gait training;Stair training;Functional mobility training;Therapeutic  activities;Therapeutic exercise;Balance training;Patient/family education    PT Goals (Current goals can be found in the Care Plan section)  Acute Rehab PT Goals Patient Stated Goal: to get her strength back PT Goal Formulation: With patient Time For Goal Achievement: 05/11/22 Potential to Achieve Goals: Fair    Frequency Min 2X/week     Co-evaluation               AM-PAC PT "6 Clicks" Mobility  Outcome Measure Help needed turning from your back to your side while in a flat bed without using bedrails?: A Lot Help needed moving from lying on your back to sitting on the side of a flat bed without using bedrails?: A Lot Help needed moving to and from a bed to a chair (including a wheelchair)?: A Lot Help needed standing up from a chair using your arms (e.g., wheelchair or bedside chair)?: A Lot Help needed to walk in hospital room?: Total Help needed climbing 3-5 steps with a railing? : Total 6 Click Score: 10    End of Session Equipment Utilized During Treatment: Gait belt Activity Tolerance: Patient limited by fatigue Patient left: in chair;with call bell/phone within reach;with chair alarm set Nurse Communication: Mobility status PT Visit Diagnosis: Difficulty in walking, not elsewhere classified (R26.2);Muscle weakness (generalized) (M62.81)    Time: 8127-5170 PT Time Calculation (min) (ACUTE ONLY): 23 min   Charges:   PT Evaluation $PT Eval Low Complexity: 1 Low PT Treatments $  Therapeutic Activity: 8-22 mins        Lieutenant Diego PT, DPT 10:00 AM,04/27/22

## 2022-04-28 DIAGNOSIS — I4891 Unspecified atrial fibrillation: Secondary | ICD-10-CM | POA: Diagnosis not present

## 2022-04-28 DIAGNOSIS — E162 Hypoglycemia, unspecified: Secondary | ICD-10-CM

## 2022-04-28 DIAGNOSIS — N179 Acute kidney failure, unspecified: Secondary | ICD-10-CM

## 2022-04-28 DIAGNOSIS — L8990 Pressure ulcer of unspecified site, unspecified stage: Secondary | ICD-10-CM

## 2022-04-28 DIAGNOSIS — E785 Hyperlipidemia, unspecified: Secondary | ICD-10-CM | POA: Diagnosis not present

## 2022-04-28 DIAGNOSIS — R112 Nausea with vomiting, unspecified: Secondary | ICD-10-CM | POA: Diagnosis not present

## 2022-04-28 LAB — GLUCOSE, CAPILLARY
Glucose-Capillary: 113 mg/dL — ABNORMAL HIGH (ref 70–99)
Glucose-Capillary: 127 mg/dL — ABNORMAL HIGH (ref 70–99)
Glucose-Capillary: 97 mg/dL (ref 70–99)
Glucose-Capillary: 114 mg/dL — ABNORMAL HIGH (ref 70–99)

## 2022-04-28 LAB — BASIC METABOLIC PANEL
Anion gap: 8 (ref 5–15)
BUN: 40 mg/dL — ABNORMAL HIGH (ref 8–23)
CO2: 24 mmol/L (ref 22–32)
Calcium: 8.4 mg/dL — ABNORMAL LOW (ref 8.9–10.3)
Chloride: 108 mmol/L (ref 98–111)
Creatinine, Ser: 2.55 mg/dL — ABNORMAL HIGH (ref 0.44–1.00)
GFR, Estimated: 19 mL/min — ABNORMAL LOW (ref >60)
Glucose, Bld: 193 mg/dL — ABNORMAL HIGH (ref 70–99)
Potassium: 4.5 mmol/L (ref 3.5–5.1)
Sodium: 140 mmol/L (ref 135–145)

## 2022-04-28 MED ORDER — APIXABAN 2.5 MG PO TABS
2.5000 mg | ORAL_TABLET | Freq: Two times a day (BID) | ORAL | Status: DC
  Administered 2022-04-28: 2.5 mg via ORAL
  Filled 2022-04-28 (×2): qty 1

## 2022-04-28 MED ORDER — INSULIN ASPART 100 UNIT/ML IJ SOLN
0.0000 [IU] | Freq: Three times a day (TID) | INTRAMUSCULAR | Status: DC
  Administered 2022-04-30: 1 [IU] via SUBCUTANEOUS
  Filled 2022-04-28: qty 1

## 2022-04-28 NOTE — Progress Notes (Addendum)
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  Patient presents to the emergency department with complaints of weakness.  Patient states she was recently diagnosed with COVID-19.  She has been admitted under observation for Generalized weakness [R53.1] Intractable nausea and vomiting [R11.2] Renal transplant recipient [Z94.0]  Patient is seen resting quietly in bed, no family at bedside. Patient is known to our practice from previous admission. She states she never fully recovered from her weakness after discharge. Reports her poor appetite persisted and she was unable to eat meals. Denies nausea and vomiting. Shortness of breath at times with activity.  Currently on room air.  No known fever and chills. Has continued to take all medications. Attempted to maintain fluid intake, but states it has decreased also.  She is requesting rehab at discharge.  Pertinent labs on ED arrival include BUN 25, calcium 8.8, creatinine 1.72 with GFR 31.  Troponin 74.  Hemoglobin 11.7.  Chest x-ray shows small bilateral pleural effusions with central congestion.  Report improved from last admission.  Since admission, creatinine has increased to 2.55 with GFR 19.  Baseline creatinine appears to be 1.34 in August.  We have been consulted to monitor acute kidney injury.   Objective:  Vital signs in last 24 hours:  Temp:  [97.5 F (36.4 C)-98.7 F (37.1 C)] 97.9 F (36.6 C) (10/19 0802) Pulse Rate:  [55-61] 55 (10/19 0802) Resp:  [17-18] 18 (10/19 0802) BP: (110-127)/(58-65) 114/58 (10/19 0802) SpO2:  [94 %-99 %] 96 % (10/19 0802)  Weight change:  Filed Weights   04/25/22 1658  Weight: 50.8 kg    Intake/Output: I/O last 3 completed shifts: In: 3556.2 [P.O.:720;  I.V.:2836.2] Out: 200 [Urine:200]   Intake/Output this shift:  No intake/output data recorded.  Physical Exam: General: NAD  Head: Normocephalic, atraumatic. Moist oral mucosal membranes  Eyes: Anicteric  Lungs:  Clear to auscultation, normal effort, room air  Heart: Regular rate and rhythm  Abdomen:  Soft, nontender, nondistended  Extremities: No peripheral edema.  Neurologic: Nonfocal, moving all four extremities  Skin: No lesions  Access: Left aVF    Basic Metabolic Panel: Recent Labs  Lab 04/25/22 1222 04/25/22 1249 04/26/22 0823 04/27/22 0528 04/28/22 0432  NA 143  --  141 141 140  K 3.7  --  3.9 3.9 4.5  CL 112*  --  112* 114* 108  CO2 22  --  15* 22 24  GLUCOSE 76  --  36* 120* 193*  BUN 25*  --  29* 31* 40*  CREATININE 1.72*  --  1.49* 2.42* 2.55*  CALCIUM 8.8*  --  8.1* 8.1* 8.4*  MG  --  2.1  --   --   --      Liver Function Tests: No results for input(s): "AST", "ALT", "ALKPHOS", "BILITOT", "PROT", "ALBUMIN" in the last 168 hours.  No results for input(s): "LIPASE", "AMYLASE" in the last 168 hours.  No results for input(s): "AMMONIA" in the last 168 hours.  CBC: Recent Labs  Lab 04/25/22 1222 04/26/22 0823 04/27/22 0528  WBC 10.1 7.6 7.5  HGB 11.7* 8.6* 9.2*  HCT 38.2 28.0* 29.3*  MCV 97.7 98.6 95.4  PLT 232 140* 169     Cardiac Enzymes: No results for input(s): "CKTOTAL", "CKMB", "CKMBINDEX", "TROPONINI" in the last 168 hours.  BNP: Invalid input(s): "POCBNP"  CBG: Recent Labs  Lab 04/27/22 0809 04/27/22 1136 04/27/22 1613 04/27/22 2124 04/28/22 0812  GLUCAP 91 164* 191* 29* 113*     Microbiology: Results for orders placed or performed during the hospital encounter of 04/14/22  Resp Panel by RT-PCR (Flu A&B, Covid) Anterior Nasal Swab     Status: Abnormal   Collection Time: 04/14/22  4:53 AM   Specimen: Anterior Nasal Swab  Result Value Ref Range Status   SARS Coronavirus 2 by RT PCR POSITIVE (A) NEGATIVE Final     Comment: (NOTE) SARS-CoV-2 target nucleic acids are DETECTED.  The SARS-CoV-2 RNA is generally detectable in upper respiratory specimens during the acute phase of infection. Positive results are indicative of the presence of the identified virus, but do not rule out bacterial infection or co-infection with other pathogens not detected by the test. Clinical correlation with patient history and other diagnostic information is necessary to determine patient infection status. The expected result is Negative.  Fact Sheet for Patients: EntrepreneurPulse.com.au  Fact Sheet for Healthcare Providers: IncredibleEmployment.be  This test is not yet approved or cleared by the [place] FDA and  has been authorized for detection and/or diagnosis of SARS-CoV-2 by FDA under an Emergency Use Authorization (EUA).  This EUA will remain in effect (meaning this test can be used) for the duration of  the COVID-19 declaration under Section 564(b)(1) of the A ct, 21 U.S.C. section 360bbb-3(b)(1), unless the authorization is terminated or revoked sooner.     Influenza A by PCR NEGATIVE NEGATIVE Final   Influenza B by PCR NEGATIVE NEGATIVE Final    Comment: (NOTE) The Xpert Xpress SARS-CoV-2/FLU/RSV plus assay is intended as an aid in the diagnosis of influenza from Nasopharyngeal swab specimens and should not be used as a sole basis for treatment. Nasal washings and aspirates are unacceptable for Xpert Xpress SARS-CoV-2/FLU/RSV testing.  Fact Sheet for Patients: EntrepreneurPulse.com.au  Fact Sheet for Healthcare Providers: IncredibleEmployment.be  This test is not yet approved or cleared by the [place] FDA and has been authorized for detection and/or diagnosis of SARS-CoV-2 by FDA under an Emergency Use Authorization (EUA). This EUA will remain in effect (meaning this test can be used) for the duration of  the COVID-19 declaration under Section 564(b)(1) of the Act, 21 U.S.C. section 360bbb-3(b)(1), unless the authorization is terminated or revoked.  Performed at [facility], [address] [place], [place] [address]   Culture, blood (routine x 2)     Status: None   Collection Time: 04/14/22  8:32 AM   Specimen: BLOOD  Result Value Ref Range Status   Specimen Description BLOOD Blood Culture adequate volume  Final   Special Requests BLOOD RIGHT FOREARM  Final   Culture   Final    NO GROWTH 5 DAYS Performed at [facility], [address] [place], [place] [address]    Report Status 04/19/2022 FINAL  Final  Culture, blood (routine x 2)     Status: None   Collection Time: 04/14/22  8:32 AM   Specimen: BLOOD  Result Value Ref Range Status   Specimen Description BLOOD Blood Culture adequate volume  Final   Special Requests BLOOD RIGHT HAND  Final   Culture   Final    NO GROWTH 5 DAYS Performed at [facility], [address] [place], [place] [address]    Report Status 04/19/2022 FINAL  Final    Coagulation Studies: No results for input(s): "LABPROT", "INR" in  the last 72 hours.  Urinalysis: No results for input(s): "COLORURINE", "LABSPEC", "PHURINE", "GLUCOSEU", "HGBUR", "BILIRUBINUR", "KETONESUR", "PROTEINUR", "UROBILINOGEN", "NITRITE", "LEUKOCYTESUR" in the last 72 hours.  Invalid input(s): "APPERANCEUR"     Imaging: No results found.   Medications:    lactated ringers 125 mL/hr at 04/28/22 3903     apixaban  5 mg Oral BID   atorvastatin  10 mg Oral Daily   cyanocobalamin  2,000 mcg Oral Daily   dextrose  1 ampule Intravenous Once   diltiazem  240 mg Oral QHS   ezetimibe  10 mg Oral Daily   flecainide  100 mg Oral Q12H   mycophenolate  500 mg Oral BID   pantoprazole (PROTONIX) IV  40 mg Intravenous Q12H   predniSONE  5 mg Oral Q breakfast   pregabalin  50 mg Oral BID   tacrolimus  3 mg Oral BID   acetaminophen **OR**  acetaminophen, albuterol, guaiFENesin, magnesium hydroxide, meclizine, ondansetron **OR** ondansetron (ZOFRAN) IV, traZODone  Assessment/ Plan:  [name] is a 71 y.o.  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  Patient presents to the emergency department with complaints of weakness.  She has been admitted under observation for Generalized weakness [R53.1] Intractable nausea and vomiting [R11.2] Renal transplant recipient [Z94.0]   Acute Kidney Injury on chronic kidney disease stage IIIb with baseline creatinine 1.34 on 02/14/2022.  Acute kidney injury secondary to hypovolemia.  Prolonged poor oral intake. Chronic kidney disease is secondary to diabetes.  Patient has received 2 renal transplants in the past, current transplant still functional.  Patient has maintained immunosuppressive medications.  Agree with IV fluids for rehydration.  Avoid nephrotoxic agents and therapies if possible.  Continue tacrolimus 3 mg twice daily and mycophenolate 500 mg twice daily.  Lab Results  Component Value Date   CREATININE 2.55 (H) 04/28/2022   CREATININE 2.42 (H) 04/27/2022   CREATININE 1.49 (H) 04/26/2022    Intake/Output Summary (Last 24 hours) at 04/28/2022 1033 Last data filed at 04/28/2022 0640 Gross per 24 hour  Intake 3316.18 ml  Output 200 ml  Net 3116.18 ml    2. Anemia of chronic kidney disease Lab Results  Component Value Date   HGB 9.2 (L) 04/27/2022  Hemoglobin within desired target  3. Diabetes mellitus type II with chronic kidney disease/renal manifestations: insulin dependent. Home regimen includes Levemir and Humalog. Most recent hemoglobin A1c is 5.6 on 03/25/21.   4.  Hypertension with chronic kidney disease.  Home regimen includes diltiazem, Zetia, lisinopril, and furosemide.  Lisinopril and furosemide currently  held.   LOS: 2 [place] 10/19/202310:33 AM  I saw and evaluated the patient with Colon Flattery, NP.  I personally formulated the plan of care.  I agree with the findings and plan as documented in the note except as noted

## 2022-04-28 NOTE — Progress Notes (Addendum)
PROGRESS NOTE    Jean Johnson   PNT:[id number] DOB: October 14, 1950  DOA: 04/25/2022 Date of Service: 04/28/22 PCP: Gladstone Lighter, MD     Brief Narrative / Hospital Course:  Jean Johnson is a 71 y.o. 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  She was initially admitted here and discharged two days ago during which she was managed for COVID-19, acute on chronic diastolic CHF and AKI, on that admission she refused SNF placement on discharge..  She continued to feel weak after discharge and has been having nausea with recurrent vomiting as mentioned above.  She is occasionally able to keep fluids down.  Has been very difficult to keep solids down.   10/16: admitted for IV fluids and IV antiemetics. SNF placement pending.  10/17: Significantly hypoglycemic this morning, corrected with D50.  TOC pending. Maintains on IV fluids. PT eval pending.  10/18: PT recommending SNF, placement pending. AKI, keep on fluids  10/19: AKI worsening despite fluids, nephrology consulted. Maintain on OV fluids. Restarted insulin sensitive sliding scale    Consultants:  none  Procedures: none      ASSESSMENT & PLAN:   Principal Problem:   Intractable nausea and vomiting Active Problems:   Atrial fibrillation with rapid ventricular response (HCC)   Type 2 diabetes mellitus with peripheral neuropathy (HCC)   Renal transplant recipient   Dyslipidemia   Generalized weakness due to deconditioning Intractable nausea and vomiting -improved IV fluids to transition to p.o. as able as needed antiemetics  IV PPI therapy with Protonix. PT/OT  --> PT recs SNF  Acute Kidney Injury on CKD 3b/4 10/19: Creatinine trending up fluid rate increased Consult nephrology   Atrial fibrillation  continue flecainide, Cardizem CD. continue Eliquis. as needed IV Cardizem as needed for rapid rate.  Type 2 diabetes mellitus with peripheral neuropathy (HCC) Hypoglycemia - resolved Restarted insulin sensitive sliding scale    Renal transplant recipient continue Prograf and CellCept as well as prednisone.  Dyslipidemia continue statin therapy and Zetia.    DVT prophylaxis: Patient is on Eliquis for A-fib Pertinent IV fluids/nutrition: IV fluids 75 mL/h. Central lines / invasive devices: None  Code Status: Full code Family Communication: None at this time  Disposition: observation --> inpatient TOC needs: SNF placement  Barriers to discharge / significant pending items: Monitoring glucose, SNF placement pending, AKI              Subjective:  Patient reports feeling tired, weak, a bit better from yesterday. No CP/SOB.        Objective:  Vitals:   04/27/22 1611 04/27/22 1939 04/28/22 0425 04/28/22 0802  BP: 113/65 127/64 (!) 110/58 (!) 114/58  Pulse: 61 61 (!) 58 (!) 55  Resp: 18 17 18 18   Temp: (!) 97.5 F (36.4 C) (!) 97.5 F (36.4 C) 98.7 F (37.1 C) 97.9 F (36.6 C)  TempSrc: Oral  Oral Oral  SpO2: 94% 99% 98% 96%  Weight:      Height:        Intake/Output Summary (Last 24 hours) at 04/28/2022 1449 Last data filed at 04/28/2022 0640 Gross per 24 hour  Intake 3076.18 ml  Output 200 ml  Net 2876.18 ml  Filed Weights   04/25/22 1658  Weight: 50.8 kg    Examination:  Constitutional:  VS as above General Appearance: alert, frail but not cachectic, NAD Respiratory: Normal respiratory effort No wheeze No rhonchi No rales Cardiovascular: S1/S2 normal +murmur Irreg/irreg rhythm Reg rate WNL No rub/gallop auscultated No lower extremity edema Gastrointestinal: No  tenderness Musculoskeletal:  No clubbing/cyanosis of digits Symmetrical movement in all extremities Neurological: No cranial nerve deficit on limited exam Alert Psychiatric: Normal judgment/insight Normal mood and affect       Scheduled Medications:   apixaban  2.5 mg Oral BID   atorvastatin  10 mg Oral Daily   cyanocobalamin  2,000 mcg Oral Daily   dextrose  1 ampule Intravenous Once   diltiazem  240 mg Oral QHS   ezetimibe  10 mg Oral Daily   flecainide  100 mg Oral Q12H   insulin aspart  0-6 Units Subcutaneous TID WC   mycophenolate  500 mg Oral BID   pantoprazole (PROTONIX) IV  40 mg Intravenous Q12H   predniSONE  5 mg Oral Q breakfast   pregabalin  50 mg Oral BID   tacrolimus  3 mg Oral BID    Continuous Infusions:  lactated ringers 125 mL/hr at 04/28/22 0917     PRN Medications:  acetaminophen **OR** acetaminophen, albuterol, guaiFENesin, magnesium hydroxide, meclizine, ondansetron **OR** ondansetron (ZOFRAN) IV, traZODone  Antimicrobials:  Anti-infectives (From admission, onward)    None       Data Reviewed: I have personally reviewed following labs and imaging studies  CBC: Recent Labs  Lab 04/25/22 1222 04/26/22 0823 04/27/22 0528  WBC 10.1 7.6 7.5  HGB 11.7* 8.6* 9.2*  HCT 38.2 28.0* 29.3*  MCV 97.7 98.6 95.4  PLT 232 140* 893   Basic Metabolic Panel: Recent Labs  Lab 04/25/22 1222 04/25/22 1249 04/26/22 0823 04/27/22 0528 04/28/22 0432  NA 143  --  141 141 140  K 3.7  --  3.9 3.9 4.5  CL 112*  --  112* 114* 108  CO2 22  --  15* 22 24  GLUCOSE 76  --  36* 120* 193*  BUN 25*  --  29* 31* 40*  CREATININE 1.72*  --  1.49* 2.42* 2.55*  CALCIUM 8.8*  --  8.1* 8.1* 8.4*  MG  --  2.1  --   --   --    GFR: Estimated Creatinine Clearance: 14.1 mL/min (A) (by C-G formula based on SCr of 2.55 mg/dL (H)). Liver Function Tests: No results for input(s): "AST", "ALT", "ALKPHOS", "BILITOT", "PROT", "ALBUMIN" in the last 168 hours. No  results for input(s): "LIPASE", "AMYLASE" in the last 168 hours. No results for input(s): "AMMONIA" in the last 168 hours. Coagulation Profile: No results for input(s): "INR", "PROTIME" in the last 168 hours. Cardiac Enzymes: No results for input(s): "CKTOTAL", "CKMB", "CKMBINDEX", "TROPONINI" in the last 168 hours. BNP (last 3 results) No results for input(s): "PROBNP" in the last 8760 hours. HbA1C: No results for input(s): "HGBA1C" in the last 72 hours. CBG: Recent Labs  Lab 04/27/22 0809 04/27/22 1136 04/27/22 1613 04/27/22 2124 04/28/22 0812  GLUCAP 91 164* 191* 226* 113*   Lipid Profile: No results for input(s): "CHOL", "HDL", "LDLCALC", "TRIG", "CHOLHDL", "LDLDIRECT" in the last 72 hours. Thyroid Function Tests: No results for input(s): "TSH", "T4TOTAL", "FREET4", "T3FREE", "THYROIDAB" in the last 72 hours. Anemia Panel: No results for input(s): "VITAMINB12", "FOLATE", "FERRITIN", "TIBC", "IRON", "RETICCTPCT" in the last 72 hours. Urine analysis:    Component  Value Date/Time   COLORURINE STRAW (A) 04/17/2022 1406   APPEARANCEUR CLEAR (A) 04/17/2022 1406   APPEARANCEUR Hazy (A) 11/16/2020 1344   LABSPEC 1.012 04/17/2022 1406   PHURINE 5.0 04/17/2022 1406   GLUCOSEU NEGATIVE 04/17/2022 1406   HGBUR MODERATE (A) 04/17/2022 1406   BILIRUBINUR NEGATIVE 04/17/2022 1406   BILIRUBINUR Negative 11/16/2020 1344   KETONESUR NEGATIVE 04/17/2022 1406   PROTEINUR 100 (A) 04/17/2022 1406   NITRITE NEGATIVE 04/17/2022 1406   LEUKOCYTESUR NEGATIVE 04/17/2022 1406   Sepsis Labs: @LABRCNTIP (procalcitonin:4,lacticidven:4)  No results found for this or any previous visit (from the past 240 hour(s)).       Radiology Studies: DG Chest 2 View  Result Date: 04/25/2022 CLINICAL DATA:  Weakness EXAM: CHEST - 2 VIEW COMPARISON:  04/17/2022, 04/14/2022 FINDINGS: Small bilateral pleural effusions. Cardiomegaly with vascular congestion. Overall improved aeration of left chest compared  with 04/17/2022. No pneumothorax. Aortic atherosclerosis. IMPRESSION: Cardiomegaly with small bilateral effusions and central congestion. Overall improved aeration of left thorax compared with 04/17/2022 Electronically Signed   By: Donavan Foil M.D.   On: 04/25/2022 17:52            LOS: 2 days       Emeterio Reeve, DO [facility] 04/28/2022, 2:49 PM   Staff may message me via secure chat in [place]  but this may not receive immediate response,  please page for urgent matters!  If 7PM-7AM, please contact night-coverage [email or web address]  Dictation software was used to generate the above note. Typos may occur and escape review, as with typed/written notes. Please contact Dr Sheppard Coil directly for clarity if needed.

## 2022-04-28 NOTE — Progress Notes (Signed)
PHARMACY NOTE:  DOSAGE ADJUSTMENT  Current regimen includes a mismatch between dosage and estimated renal function.  As per policy approved by the Pharmacy & Therapeutics and Medical Executive Committees, the dosage will be adjusted accordingly.  Current dosage:  apixaban 5 mg po BID  Indication: atrial fibrillation  Renal Function:  Estimated Creatinine Clearance: 14.1 mL/min (A) (by C-G formula based on SCr of 2.55 mg/dL (H)).    Antimicrobial dosage has been changed to:  apixaban 2.5 mg po BID  Meets 2/3 dose-reduction criteria: Serum creatinine ?1.5 mg/dL and  body weight ?60 kg: 2.5 mg twice daily  Thank you for allowing pharmacy to be a part of this patient's care.  [name], [facility] 04/28/2022 2:32 PM

## 2022-04-28 NOTE — Inpatient Diabetes Management (Signed)
Inpatient Diabetes Program Recommendations  AACE/ADA: New Consensus Statement on Inpatient Glycemic Control   Target Ranges:  Prepandial:   less than 140 mg/dL      [facility] postprandial:   less than 180 mg/dL (1-2 hours)      Critically ill patients:  140 - 180 mg/dL    Latest Reference Range & Units 04/27/22 08:09 04/27/22 11:36 04/27/22 16:13 04/27/22 21:24 04/28/22 08:12  Glucose-Capillary 70 - 99 mg/dL 91 164 (H) 191 (H) 226 (H) 113 (H)    Review of Glycemic Control  Diabetes history: DM2 Outpatient Diabetes medications: Levemir 8 units QHS, Humalog 0-5 units BID Current orders for Inpatient glycemic control: None; Prednisone 5 mg QAM  Inpatient Diabetes Program Recommendations:    Insulin: Please consider ordering Novolog 0-6 units TID with meals.  Thanks, Barnie Alderman, RN, MSN, [facility] Coordinator Inpatient Diabetes Program [phone] (Team Pager from 8am to [place])

## 2022-04-28 NOTE — Progress Notes (Signed)
Spoke with patient regarding SNF search and bed offer. Patient chose [facility]. Message sent to [facility] in [facility] to confirm bed offer.

## 2022-04-29 ENCOUNTER — Inpatient Hospital Stay
Admit: 2022-04-29 | Discharge: 2022-04-29 | Disposition: A | Discharge status: Home / Self Care | Payer: Medicare Other | Attending: Osteopathic Medicine | Admitting: Osteopathic Medicine

## 2022-04-29 ENCOUNTER — Inpatient Hospital Stay: Payer: Medicare Other

## 2022-04-29 DIAGNOSIS — I509 Heart failure, unspecified: Secondary | ICD-10-CM

## 2022-04-29 DIAGNOSIS — E785 Hyperlipidemia, unspecified: Secondary | ICD-10-CM | POA: Diagnosis not present

## 2022-04-29 DIAGNOSIS — L8940 Pressure ulcer of contiguous site of back, buttock and hip, unspecified stage: Secondary | ICD-10-CM

## 2022-04-29 DIAGNOSIS — R112 Nausea with vomiting, unspecified: Secondary | ICD-10-CM | POA: Diagnosis not present

## 2022-04-29 DIAGNOSIS — I4891 Unspecified atrial fibrillation: Secondary | ICD-10-CM | POA: Diagnosis not present

## 2022-04-29 DIAGNOSIS — N179 Acute kidney failure, unspecified: Secondary | ICD-10-CM | POA: Diagnosis not present

## 2022-04-29 LAB — BODY FLUID CELL COUNT WITH DIFFERENTIAL
Eos, Fluid: 0 %
Lymphs, Fluid: 96 %
Monocyte-Macrophage-Serous Fluid: 3 %
Neutrophil Count, Fluid: 1 %
Total Nucleated Cell Count, Fluid: 1042 cu mm

## 2022-04-29 LAB — PROTEIN, PLEURAL OR PERITONEAL FLUID: Total protein, fluid: <3 g/dL

## 2022-04-29 LAB — PATHOLOGIST SMEAR REVIEW

## 2022-04-29 LAB — CBC
HCT: 32.3 % — ABNORMAL LOW (ref 36.0–46.0)
Hemoglobin: 10 g/dL — ABNORMAL LOW (ref 12.0–15.0)
MCH: 30.3 pg (ref 26.0–34.0)
MCHC: 31 g/dL (ref 30.0–36.0)
MCV: 97.9 fL (ref 80.0–100.0)
Platelets: 201 10*3/uL (ref 150–400)
RBC: 3.3 MIL/uL — ABNORMAL LOW (ref 3.87–5.11)
RDW: 14 % (ref 11.5–15.5)
WBC: 9.7 10*3/uL (ref 4.0–10.5)
nRBC: 0 % (ref 0.0–0.2)

## 2022-04-29 LAB — BASIC METABOLIC PANEL
Anion gap: 7 (ref 5–15)
BUN: 34 mg/dL — ABNORMAL HIGH (ref 8–23)
CO2: 23 mmol/L (ref 22–32)
Calcium: 8.3 mg/dL — ABNORMAL LOW (ref 8.9–10.3)
Chloride: 110 mmol/L (ref 98–111)
Creatinine, Ser: 2.46 mg/dL — ABNORMAL HIGH (ref 0.44–1.00)
GFR, Estimated: 20 mL/min — ABNORMAL LOW (ref >60)
Glucose, Bld: 118 mg/dL — ABNORMAL HIGH (ref 70–99)
Potassium: 4 mmol/L (ref 3.5–5.1)
Sodium: 140 mmol/L (ref 135–145)

## 2022-04-29 LAB — ECHOCARDIOGRAM COMPLETE
Height: 60 in
S' Lateral: 2.8 cm
Weight: 1791.9 oz

## 2022-04-29 LAB — GLUCOSE, CAPILLARY
Glucose-Capillary: 117 mg/dL — ABNORMAL HIGH (ref 70–99)
Glucose-Capillary: 128 mg/dL — ABNORMAL HIGH (ref 70–99)
Glucose-Capillary: 119 mg/dL — ABNORMAL HIGH (ref 70–99)
Glucose-Capillary: 227 mg/dL — ABNORMAL HIGH (ref 70–99)

## 2022-04-29 LAB — LACTATE DEHYDROGENASE, PLEURAL OR PERITONEAL FLUID: LD, Fluid: 70 U/L — ABNORMAL HIGH (ref 3–23)

## 2022-04-29 LAB — BRAIN NATRIURETIC PEPTIDE: B Natriuretic Peptide: 567.6 pg/mL — ABNORMAL HIGH (ref 0.0–100.0)

## 2022-04-29 MED ORDER — LIDOCAINE HCL (PF) 1 % IJ SOLN
9.0000 mL | Freq: Once | INTRAMUSCULAR | Status: AC
  Administered 2022-04-29: 9 mL via INTRADERMAL

## 2022-04-29 MED ORDER — FUROSEMIDE 10 MG/ML IJ SOLN
20.0000 mg | Freq: Once | INTRAMUSCULAR | Status: AC
  Administered 2022-04-29: 20 mg via INTRAVENOUS
  Filled 2022-04-29: qty 4

## 2022-04-29 NOTE — Progress Notes (Signed)
Patient is SOB with lower lobes fine crakles. Informed Randol Kern NP about patients current condition. Will get an x-ray.

## 2022-04-29 NOTE — Progress Notes (Addendum)
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  Patient presents to the emergency department with complaints of weakness.  Patient states she was recently diagnosed with COVID-19.  She has been admitted under observation for Generalized weakness [R53.1] Intractable nausea and vomiting [R11.2] Renal transplant recipient [Z94.0]  Patient is seen resting quietly in bed, no family at bedside. Patient is known to our practice from previous admission.   Patient laying in bed, ill appearing Chart review states patient complained of shortness of breath overngiht, IVF decreased and chest xray shows increased pleural effusion.   Addendum Patient was seen after thoracentesis as well patient informed me that she was feeling better than before   Objective:  Vital signs in last 24 hours:  Temp:  [97.9 F (36.6 C)-98.4 F (36.9 C)] 97.9 F (36.6 C) (10/20 0736) Pulse Rate:  [56-72] 56 (10/20 1133) Resp:  [14-18] 18 (10/20 0736) BP: (106-152)/(61-80) 133/63 (10/20 1133) SpO2:  [91 %-100 %] 97 % (10/20 1133)  Weight change:  Filed Weights   04/25/22 1658  Weight: 50.8 kg    Intake/Output: I/O last 3 completed shifts: In: 5440.9 [P.O.:240; I.V.:5200.9] Out: 300 [Urine:300]   Intake/Output this shift:  No intake/output data recorded.  Physical Exam: General: NAD  Head: Normocephalic, atraumatic. Moist oral mucosal membranes  Eyes: Anicteric  Lungs:  Diminished basilar lung sounds, normal effort, room air  Heart: Regular rate and rhythm  Abdomen:  Soft, nontender, nondistended  Extremities: No peripheral edema.  Neurologic: Nonfocal, moving all four extremities  Skin: No lesions  Access: Left aVF    Basic Metabolic Panel: Recent Labs  Lab  04/25/22 1222 04/25/22 1249 04/26/22 0823 04/27/22 0528 04/28/22 0432 04/29/22 0458  NA 143  --  141 141 140 140  K 3.7  --  3.9 3.9 4.5 4.0  CL 112*  --  112* 114* 108 110  CO2 22  --  15* 22 24 23   GLUCOSE 76  --  36* 120* 193* 118*  BUN 25*  --  29* 31* 40* 34*  CREATININE 1.72*  --  1.49* 2.42* 2.55* 2.46*  CALCIUM 8.8*  --  8.1* 8.1* 8.4* 8.3*  MG  --  2.1  --   --   --   --      Liver Function Tests: No results for input(s): "AST", "ALT", "ALKPHOS", "BILITOT", "PROT", "ALBUMIN" in the last 168 hours.  No results for input(s): "LIPASE", "AMYLASE" in the last 168 hours.  No results for input(s): "AMMONIA" in the last 168 hours.  CBC: Recent Labs  Lab 04/25/22 1222 04/26/22 0823 04/27/22 0528 04/29/22 0458  WBC 10.1 7.6 7.5 9.7  HGB 11.7* 8.6* 9.2* 10.0*  HCT 38.2 28.0* 29.3* 32.3*  MCV 97.7 98.6 95.4 97.9  PLT 232 140* 169 201     Cardiac Enzymes: No results for input(s): "CKTOTAL", "CKMB", "CKMBINDEX", "TROPONINI" in the last 168 hours.  BNP: Invalid input(s): "POCBNP"  CBG: Recent Labs  Lab 04/28/22 0812 04/28/22 1443 04/28/22 1645 04/28/22 2117 04/29/22 0737  GLUCAP 113* 127* 97 114* 117*     Microbiology: Results for orders placed or performed during the hospital encounter of 04/14/22  Resp Panel by RT-PCR (Flu A&B, Covid) Anterior Nasal Swab     Status: Abnormal   Collection Time: 04/14/22  4:53 AM  Specimen: Anterior Nasal Swab  Result Value Ref Range Status   SARS Coronavirus 2 by RT PCR POSITIVE (A) NEGATIVE Final    Comment: (NOTE) SARS-CoV-2 target nucleic acids are DETECTED.  The SARS-CoV-2 RNA is generally detectable in upper respiratory specimens during the acute phase of infection. Positive results are indicative of the presence of the identified virus, but do not rule out bacterial infection or co-infection with other pathogens not detected by the test. Clinical correlation with patient history and other diagnostic  information is necessary to determine patient infection status. The expected result is Negative.  Fact Sheet for Patients: EntrepreneurPulse.com.au  Fact Sheet for Healthcare Providers: IncredibleEmployment.be  This test is not yet approved or cleared by the [place] FDA and  has been authorized for detection and/or diagnosis of SARS-CoV-2 by FDA under an Emergency Use Authorization (EUA).  This EUA will remain in effect (meaning this test can be used) for the duration of  the COVID-19 declaration under Section 564(b)(1) of the A ct, 21 U.S.C. section 360bbb-3(b)(1), unless the authorization is terminated or revoked sooner.     Influenza A by PCR NEGATIVE NEGATIVE Final   Influenza B by PCR NEGATIVE NEGATIVE Final    Comment: (NOTE) The Xpert Xpress SARS-CoV-2/FLU/RSV plus assay is intended as an aid in the diagnosis of influenza from Nasopharyngeal swab specimens and should not be used as a sole basis for treatment. Nasal washings and aspirates are unacceptable for Xpert Xpress SARS-CoV-2/FLU/RSV testing.  Fact Sheet for Patients: EntrepreneurPulse.com.au  Fact Sheet for Healthcare Providers: IncredibleEmployment.be  This test is not yet approved or cleared by the [place] FDA and has been authorized for detection and/or diagnosis of SARS-CoV-2 by FDA under an Emergency Use Authorization (EUA). This EUA will remain in effect (meaning this test can be used) for the duration of the COVID-19 declaration under Section 564(b)(1) of the Act, 21 U.S.C. section 360bbb-3(b)(1), unless the authorization is terminated or revoked.  Performed at [facility], [address] [place], [place] [address]   Culture, blood (routine x 2)     Status: None   Collection Time: 04/14/22  8:32 AM   Specimen: BLOOD  Result Value Ref Range Status   Specimen Description BLOOD Blood Culture adequate volume   Final   Special Requests BLOOD RIGHT FOREARM  Final   Culture   Final    NO GROWTH 5 DAYS Performed at [facility], [address] [place], [place] [address]    Report Status 04/19/2022 FINAL  Final  Culture, blood (routine x 2)     Status: None   Collection Time: 04/14/22  8:32 AM   Specimen: BLOOD  Result Value Ref Range Status   Specimen Description BLOOD Blood Culture adequate volume  Final   Special Requests BLOOD RIGHT HAND  Final   Culture   Final    NO GROWTH 5 DAYS Performed at [facility], [address] [place], [place] [address]    Report Status 04/19/2022 FINAL  Final    Coagulation Studies: No results for input(s): "LABPROT", "INR" in the last 72 hours.  Urinalysis: No results for input(s): "COLORURINE", "LABSPEC", "PHURINE", "GLUCOSEU", "HGBUR", "BILIRUBINUR", "KETONESUR", "PROTEINUR", "UROBILINOGEN", "NITRITE", "LEUKOCYTESUR" in the last 72 hours.  Invalid input(s): "APPERANCEUR"     Imaging: DG Chest Port 1 View  Result Date: 04/29/2022 CLINICAL DATA:  Status post left thoracentesis. EXAM: PORTABLE CHEST 1 VIEW COMPARISON:  Chest radiograph 04/29/2022 at 6:54 a.m. FINDINGS: The cardiac silhouette remains enlarged with  similar appearance of pulmonary vascular congestion and interstitial and patchy airspace opacities in both lungs. There is improved aeration of the left lung base status post thoracentesis, likely with at most a small pleural effusion remaining. A trace right pleural effusion persists. No left-sided pneumothorax is identified. Curvilinear density projecting over the right lung apex appears to extend beyond the lung margins and is favored to reflect a skin fold, with suggestion of lung markings also extending beyond this line in the apex. Prior cervical spine fusion is noted. IMPRESSION: 1. Improved aeration of the left lung base status post thoracentesis. No left-sided pneumothorax. 2. Suspected skin fold overlying the right  lung apex. Attention on follow-up to exclude a pneumothorax. 3. Unchanged pulmonary edema. Electronically Signed   By: Logan Bores M.D.   On: 04/29/2022 11:55   DG Chest Port 1 View  Result Date: 04/29/2022 CLINICAL DATA:  71 year old female with history of crackles in the lungs and dry cough. EXAM: PORTABLE CHEST 1 VIEW COMPARISON:  Chest x-ray 04/25/2022. FINDINGS: Compared to the prior examination there is now large cephalization of the pulmonary vasculature, indistinct interstitial markings and patchy ill-defined opacities throughout the lungs bilaterally, suggestive of worsening pulmonary edema. Opacity at the left base which may reflect atelectasis and/or consolidation, with superimposed moderate to large left pleural effusion. Probable trace right pleural effusion. No pneumothorax. Heart size is mildly enlarged. Upper mediastinal contours are within normal limits. Atherosclerotic calcifications in the thoracic aorta. Orthopedic fixation hardware in the lower cervical spine incompletely imaged. IMPRESSION: 1. The appearance of the chest suggests worsening congestive heart failure, as above. 2. Increasing atelectasis and/or consolidation in the base of the left lung with increasing moderate to large left pleural effusion. Probable trace right pleural effusion. 3. Aortic atherosclerosis. Electronically Signed   By: Vinnie Langton M.D.   On: 04/29/2022 07:06     Medications:    lactated ringers 75 mL/hr at 04/29/22 8588     atorvastatin  10 mg Oral Daily   cyanocobalamin  2,000 mcg Oral Daily   dextrose  1 ampule Intravenous Once   diltiazem  240 mg Oral QHS   ezetimibe  10 mg Oral Daily   flecainide  100 mg Oral Q12H   insulin aspart  0-6 Units Subcutaneous TID WC   mycophenolate  500 mg Oral BID   pantoprazole (PROTONIX) IV  40 mg Intravenous Q12H   predniSONE  5 mg Oral Q breakfast   pregabalin  50 mg Oral BID   tacrolimus  3 mg Oral BID   acetaminophen **OR** acetaminophen,  albuterol, guaiFENesin, magnesium hydroxide, meclizine, ondansetron **OR** ondansetron (ZOFRAN) IV, traZODone  Assessment/ Plan:  Jean Johnson is a 71 y.o.  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  Patient presents to the emergency department with complaints of weakness.  She has been admitted under observation for Generalized weakness [R53.1] Intractable nausea and vomiting [R11.2] Renal transplant recipient [Z94.0]   Acute Kidney Injury on chronic kidney disease stage IIIb with baseline creatinine 1.34 on 02/14/2022.  Acute kidney injury secondary to hypovolemia.  Prolonged poor oral intake. Chronic kidney disease is secondary to diabetes.  Patient has received 2 renal transplants in the past, current transplant still functional.  Patient has maintained immunosuppressive medications.  Avoid nephrotoxic agents and therapies if possible.  Continue tacrolimus 3 mg twice daily and mycophenolate 500 mg twice daily.  Discontinued patient's IV fluids  Give patient a dose of Lasix We will monitor closely  Lab Results  Component Value Date   CREATININE 2.46 (H) 04/29/2022   CREATININE 2.55 (H) 04/28/2022   CREATININE 2.42 (H) 04/27/2022    Intake/Output Summary (Last 24 hours) at 04/29/2022 1232 Last data filed at 04/29/2022 0521 Gross per 24 hour  Intake 2364.71 ml  Output 100 ml  Net 2264.71 ml    2. Left pleural effusion, seen on chest xray. Patient reports SOB. IR consulted for thoracentesis.   Addendum Patient underwent thoracentesis today Clinically better   3. Anemia of chronic kidney disease Lab Results  Component Value Date   HGB 10.0 (L) 04/29/2022  Hemoglobin at goal  4. Diabetes mellitus type II with chronic kidney disease/renal manifestations: insulin dependent. Home regimen includes Levemir and Humalog. Most  recent hemoglobin A1c is 5.6 on 03/25/21.   5.  Hypertension with chronic kidney disease.  Home regimen includes diltiazem, Zetia, lisinopril, and furosemide.  Lisinopril and furosemide currently held.  6. Fluid overload Patient will benefit from 2D echo Give patient dose of diuretics today Patient most likely has diastolic CHF   LOS: 3 [place] 10/20/202312:32 PM   I saw and evaluated the patient with Colon Flattery, NP.  I personally formulated the plan of care.  I agree with the findings and plan as documented in the note except as noted .

## 2022-04-29 NOTE — Progress Notes (Signed)
OT Cancellation Note  Patient Details Name: Jean Johnson MRN: [id number] DOB: 1951-05-13   Cancelled Treatment:    Reason Eval/Treat Not Completed: Patient at procedure or test/ unavailable. Pt currently off the unit for ultrasound. OT to re-attempt as time allows.  Darleen Crocker, MS, OTR/L , CBIS ascom [phone]  04/29/22, 11:04 AM

## 2022-04-29 NOTE — Progress Notes (Signed)
*  PRELIMINARY RESULTS* Echocardiogram 2D Echocardiogram has been performed.  Jean Johnson 04/29/2022, 1:44 PM

## 2022-04-29 NOTE — Progress Notes (Signed)
Physical Therapy Treatment Patient Details Name: Jean Johnson MRN: [id number] DOB: 01/28/1951 Today's Date: 04/29/2022   History of Present Illness is a 71 y.o. African-American female with medical history significant for paroxysmal atrial fibrillation on Eliquis, type 2 diabetes mellitus with diabetic neuropathy, end-stage renal disease, s/p renal transplant twice in 2000 and 2010, dyslipidemia and hypertension, who presented to the emergency room with acute onset of intractable nausea and vomiting with associated generalized weakness. noted for recent hospital admission.    PT Comments    Patient alert agreeable to attempt mobility today. Seen as PT/OT co-treat to maximize function and safety. Several supine exercises performed, pt needed AAROM for all LLE movements, pt endorsed her weakness is much more than usual. Supine to sit with maxAx2 (for safety). Noted for zero sitting balance; strong L lateral lean that required hands on assist throughout sitting and standing. Sit <> stand with RW and maxAx2, with hands on facilitation for trunk extension, LLE extension and weight bearing management. Unable to safely step today. Returned to supine maxAx2 and bed rolling performed for linens. The patient would benefit from further skilled PT intervention to continue to progress towards goals. Recommendation remains appropriate.     Recommendations for follow up therapy are one component of a multi-disciplinary discharge planning process, led by the attending physician.  Recommendations may be updated based on patient status, additional functional criteria and insurance authorization.  Follow Up Recommendations  Skilled nursing-short term rehab (<3 hours/day) Can patient physically be transported by private vehicle: No   Assistance Recommended at Discharge Frequent or constant Supervision/Assistance  Patient can return home with the following Assistance with cooking/housework;A lot of help with  bathing/dressing/bathroom;Direct supervision/assist for medications management;Assist for transportation;Help with stairs or ramp for entrance;Two people to help with walking and/or transfers;Two people to help with bathing/dressing/bathroom   Equipment Recommendations  Other (comment) (TBD)    Recommendations for Other Services       Precautions / Restrictions Precautions Precautions: Fall Restrictions Weight Bearing Restrictions: No     Mobility  Bed Mobility Overal bed mobility: Needs Assistance Bed Mobility: Sit to Supine, Rolling Rolling: Max assist   Supine to sit: Max assist, +2 for safety/equipment, HOB elevated Sit to supine: Max assist, +2 for physical assistance, HOB elevated        Transfers Overall transfer level: Needs assistance Equipment used: Rolling walker (2 wheels) Transfers: Sit to/from Stand Sit to Stand: Max assist, +2 physical assistance           General transfer comment: twice from EOB, maxAx2    Ambulation/Gait               General Gait Details: unable to truly ambulate   Stairs             Wheelchair Mobility    Modified Rankin (Stroke Patients Only)       Balance Overall balance assessment: Needs assistance Sitting-balance support: Feet supported, Bilateral upper extremity supported Sitting balance-Leahy Scale: Zero Sitting balance - Comments: needed constant hands on assist due to strong L lateral lean. pt able to minimally correct. Postural control: Left lateral lean   Standing balance-Leahy Scale: Zero Standing balance comment: Required manual facilitation to stand upright; L knee buckling, very unsteady.                            Cognition Arousal/Alertness: Awake/alert Behavior During Therapy: WFL for tasks assessed/performed, Restless Overall Cognitive Status: Within  Functional Limits for tasks assessed                                          Exercises Other  Exercises Other Exercises: ankle pumps, AAROM LLE knee flexion, SAQ, hip abduction/adduction. AROM on RLE though still challenging for pt    General Comments        Pertinent Vitals/Pain Pain Assessment Pain Assessment: No/denies pain    Home Living                          Prior Function            PT Goals (current goals can now be found in the care plan section) Progress towards PT goals: Progressing toward goals    Frequency    Min 2X/week      PT Plan Current plan remains appropriate    Co-evaluation              AM-PAC PT "6 Clicks" Mobility   Outcome Measure  Help needed turning from your back to your side while in a flat bed without using bedrails?: A Lot Help needed moving from lying on your back to sitting on the side of a flat bed without using bedrails?: A Lot Help needed moving to and from a bed to a chair (including a wheelchair)?: Total Help needed standing up from a chair using your arms (e.g., wheelchair or bedside chair)?: Total Help needed to walk in hospital room?: Total Help needed climbing 3-5 steps with a railing? : Total 6 Click Score: 8    End of Session Equipment Utilized During Treatment: Gait belt Activity Tolerance: Patient limited by fatigue Patient left: with call bell/phone within reach;in bed;with bed alarm set Nurse Communication: Mobility status PT Visit Diagnosis: Difficulty in walking, not elsewhere classified (R26.2);Muscle weakness (generalized) (M62.81)     Time: 0962-8366 PT Time Calculation (min) (ACUTE ONLY): 23 min  Charges:  $Therapeutic Exercise: 8-22 mins                     Lieutenant Diego PT, DPT 2:58 PM,04/29/22

## 2022-04-29 NOTE — Progress Notes (Signed)
PT Cancellation Note  Patient Details Name: Jean Johnson MRN: [id number] DOB: 1951/06/03   Cancelled Treatment:    Reason Eval/Treat Not Completed: Other (comment). Pt with transport at bedside, PT to re-attempt as able.   Lieutenant Diego PT, DPT 10:42 AM,04/29/22

## 2022-04-29 NOTE — Progress Notes (Signed)
PROGRESS NOTE    Aydan Phoenix   WLS:[id number] DOB: 21-Sep-1950  DOA: 04/25/2022 Date of Service: 04/29/22 PCP: Gladstone Lighter, MD     Brief Narrative / Hospital Course:  Teletha Petrea is a 71 y.o. 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  She was initially admitted here and discharged two days ago during which she was managed for COVID-19, acute on chronic diastolic CHF and AKI, on that admission she refused SNF placement on discharge..  She continued to feel weak after discharge and has been having N/V. 10/16: admitted for IV fluids and IV antiemetics. SNF placement pending.  10/17: Significantly hypoglycemic this morning, corrected with D50.  TOC pending. Maintains on IV fluids. PT eval pending.  10/18: PT recommending SNF, placement pending. AKI, keep on fluids  10/19: AKI worsening despite fluids, nephrology consulted --> Maintain on IV fluids. Restarted insulin sensitive sliding scale   10/20: SOB overnight, crackles per RN, night coverage ordered CXR, concerning for increased pulm edema / effusion. Echo ordered. BNP added to AM labs. Thoracentesis ordered, Eliquis held. IV fluid rate reduced and dose Lasix administered per nephrology Cr improved somewhat on AM labs.  BNP came back with elevation at 567.   Consultants:  none  Procedures: 04/29/22 thoracentesis yielded 350 mL watery straw colored fluid, labs pending       ASSESSMENT & PLAN:   Principal Problem:   Intractable nausea and vomiting Active Problems:   Atrial fibrillation with rapid ventricular response (HCC)   Type 2 diabetes mellitus with  peripheral neuropathy ([place])   Renal transplant recipient   Dyslipidemia   Generalized weakness due to deconditioning Intractable nausea and vomiting -improved IV fluids to transition to p.o. as able / pending AKI and nephro recs  as needed antiemetics  IV PPI therapy with Protonix. PT/OT --> PT recs SNF  Acute Kidney Injury on CKD 3b/4 likely d/t dehydration - improving  Consult nephrology 10/19 --> continue IV fluids 10/20 Cr slightly better, increased pleural edema/effusion --> thoracentesis yielded 350 mL, reduced rate IV fluids appreciate further nephrology recs  Clinical CHF w/o history  Heart size mildly enlarged, acute pulmonary edema, pulmonary effusion BNP Echo Reduced IV fluids rate, AKI in hx renal transplant patient, nephrology following, may have to consider diuresis  Consider cardiology consult, pending BNP/Echo and nephro recs   Atrial fibrillation - stable continue flecainide, Cardizem CD. continue Eliquis. as needed IV Cardizem as needed for rapid rate.  Type 2 diabetes mellitus with peripheral neuropathy (HCC) Hypoglycemia - resolved Restarted insulin sensitive sliding scale    Renal transplant recipient continue Prograf and CellCept as well as prednisone.  Dyslipidemia continue statin therapy and Zetia.    DVT prophylaxis: Patient is on Eliquis for A-fib Pertinent IV fluids/nutrition: IV fluids 75 mL/h. Central lines / invasive devices: None  Code Status: Full code Family Communication: None at this time  Disposition: observation --> inpatient TOC needs: SNF placement  Barriers to discharge / significant pending items: Monitoring glucose, SNF placement pending, AKI , pending echocardiogram             Subjective:  Patient seen following her thoracentesis, reports improvement in shortness of breath compared to overnight.  Objective:  Vitals:   04/29/22 0415 04/29/22 0736 04/29/22 1108 04/29/22 1133  BP: 125/64 134/61 (!)  152/80 133/63  Pulse: 68 68 72 (!) 56  Resp: 18 18    Temp: 98.4 F (36.9 C) 97.9 F (36.6 C)    TempSrc:  Oral    SpO2: 91% 99% 100% 97%  Weight:      Height:        Intake/Output Summary (Last 24 hours) at 04/29/2022 1444 Last data filed at 04/29/2022 5009 Gross per 24 hour  Intake 2364.71 ml  Output 100 ml  Net 2264.71 ml   Filed Weights   04/25/22 1658  Weight: 50.8 kg    Examination:  Constitutional:  VS as above General Appearance: alert, frail but not cachectic, NAD Respiratory: Normal respiratory effort No wheeze No rhonchi No rales Cardiovascular: S1/S2 normal +murmur Irreg/irreg rhythm Reg rate WNL No rub/gallop auscultated No lower extremity edema Gastrointestinal: No tenderness Musculoskeletal:  No clubbing/cyanosis of digits Symmetrical movement in all extremities Neurological: No cranial nerve deficit on limited exam Alert Psychiatric: Normal judgment/insight Normal mood and affect       Scheduled Medications:   atorvastatin  10 mg Oral Daily   cyanocobalamin  2,000 mcg Oral Daily   dextrose  1 ampule Intravenous Once   diltiazem  240 mg Oral QHS   ezetimibe  10 mg Oral Daily   flecainide  100 mg Oral Q12H   insulin aspart  0-6 Units Subcutaneous TID WC   mycophenolate  500 mg Oral BID   pantoprazole (PROTONIX) IV  40 mg Intravenous Q12H   predniSONE  5 mg Oral Q breakfast   pregabalin  50 mg Oral BID   tacrolimus  3 mg Oral BID    Continuous Infusions:  lactated ringers 75 mL/hr at 04/29/22 0907     PRN Medications:  acetaminophen **OR** acetaminophen, albuterol, guaiFENesin, magnesium hydroxide, meclizine, ondansetron **OR** ondansetron (ZOFRAN) IV, traZODone  Antimicrobials:  Anti-infectives (From admission, onward)    None       Data Reviewed: I have personally reviewed following labs and imaging studies  CBC: Recent Labs  Lab 04/25/22 1222 04/26/22 0823 04/27/22 0528 04/29/22 0458  WBC 10.1 7.6 7.5 9.7   HGB 11.7* 8.6* 9.2* 10.0*  HCT 38.2 28.0* 29.3* 32.3*  MCV 97.7 98.6 95.4 97.9  PLT 232 140* 169 381   Basic Metabolic Panel: Recent Labs  Lab 04/25/22 1222 04/25/22 1249 04/26/22 0823 04/27/22 0528 04/28/22 0432 04/29/22 0458  NA 143  --  141 141 140 140  K 3.7  --  3.9 3.9 4.5 4.0  CL 112*  --  112* 114* 108 110  CO2 22  --  15* 22 24 23   GLUCOSE 76  --  36* 120* 193* 118*  BUN 25*  --  29* 31* 40* 34*  CREATININE 1.72*  --  1.49* 2.42* 2.55* 2.46*  CALCIUM 8.8*  --  8.1* 8.1* 8.4* 8.3*  MG  --  2.1  --   --   --   --    GFR: Estimated Creatinine Clearance: 14.6 mL/min (A) (by C-G formula based on SCr of 2.46 mg/dL (H)). Liver Function Tests: No results for input(s): "AST", "ALT", "ALKPHOS", "BILITOT", "PROT", "ALBUMIN" in the last 168 hours. No results for input(s): "LIPASE", "AMYLASE" in the last 168 hours. No results for input(s): "AMMONIA" in the last 168 hours. Coagulation Profile: No results for input(s): "INR", "PROTIME" in the last 168 hours. Cardiac Enzymes: No results for  input(s): "CKTOTAL", "CKMB", "CKMBINDEX", "TROPONINI" in the last 168 hours. BNP (last 3 results) No results for input(s): "PROBNP" in the last 8760 hours. HbA1C: No results for input(s): "HGBA1C" in the last 72 hours. CBG: Recent Labs  Lab 04/28/22 1443 04/28/22 1645 04/28/22 2117 04/29/22 0737 04/29/22 1240  GLUCAP 127* 97 114* 117* 128*   Lipid Profile: No results for input(s): "CHOL", "HDL", "LDLCALC", "TRIG", "CHOLHDL", "LDLDIRECT" in the last 72 hours. Thyroid Function Tests: No results for input(s): "TSH", "T4TOTAL", "FREET4", "T3FREE", "THYROIDAB" in the last 72 hours. Anemia Panel: No results for input(s): "VITAMINB12", "FOLATE", "FERRITIN", "TIBC", "IRON", "RETICCTPCT" in the last 72 hours. Urine analysis:    Component Value Date/Time   COLORURINE STRAW (A) 04/17/2022 1406   APPEARANCEUR CLEAR (A) 04/17/2022 1406   APPEARANCEUR Hazy (A) 11/16/2020 1344   LABSPEC  1.012 04/17/2022 1406   PHURINE 5.0 04/17/2022 1406   GLUCOSEU NEGATIVE 04/17/2022 1406   HGBUR MODERATE (A) 04/17/2022 1406   BILIRUBINUR NEGATIVE 04/17/2022 1406   BILIRUBINUR Negative 11/16/2020 1344   KETONESUR NEGATIVE 04/17/2022 1406   PROTEINUR 100 (A) 04/17/2022 1406   NITRITE NEGATIVE 04/17/2022 1406   LEUKOCYTESUR NEGATIVE 04/17/2022 1406   Sepsis Labs: @LABRCNTIP (procalcitonin:4,lacticidven:4)  No results found for this or any previous visit (from the past 240 hour(s)).       Radiology Studies: DG Chest 2 View  Result Date: 04/25/2022 CLINICAL DATA:  Weakness EXAM: CHEST - 2 VIEW COMPARISON:  04/17/2022, 04/14/2022 FINDINGS: Small bilateral pleural effusions. Cardiomegaly with vascular congestion. Overall improved aeration of left chest compared with 04/17/2022. No pneumothorax. Aortic atherosclerosis. IMPRESSION: Cardiomegaly with small bilateral effusions and central congestion. Overall improved aeration of left thorax compared with 04/17/2022 Electronically Signed   By: Donavan Foil M.D.   On: 04/25/2022 17:52            LOS: 3 days       Emeterio Reeve, DO [facility] 04/29/2022, 2:44 PM   Staff may message me via secure chat in [place]  but this may not receive immediate response,  please page for urgent matters!  If 7PM-7AM, please contact night-coverage [email or web address]  Dictation software was used to generate the above note. Typos may occur and escape review, as with typed/written notes. Please contact Dr Sheppard Coil directly for clarity if needed.

## 2022-04-29 NOTE — Progress Notes (Addendum)
Spoke with Tammy from [facility]. Facility will accept weekend discharge.    2:00pm IM letter given

## 2022-04-29 NOTE — Care Management Important Message (Signed)
Important Message  Patient Details  Name: Jean Johnson MRN: [id number] Date of Birth: [birth year]   Medicare Important Message Given:  Yes     Juliann Pulse A Jojo Pehl 04/29/2022, 2:54 PM

## 2022-04-29 NOTE — Progress Notes (Signed)
Patient is complaining of abdominal pain due to coughing. Administered mucinex and MOM for constipation. Patient is concerned about her prolonged pain weakness in left leg and would like an xray performed.

## 2022-04-29 NOTE — Progress Notes (Signed)
Occupational Therapy Treatment Patient Details Name: Jean Johnson MRN: [id number] DOB: [birth year] Today's Date: 04/29/2022   History of present illness is a 71 y.o. African-American female with medical history significant for paroxysmal atrial fibrillation on Eliquis, type 2 diabetes mellitus with diabetic neuropathy, end-stage renal disease, s/p renal transplant twice in 2000 and 2010, dyslipidemia and hypertension, who presented to the emergency room with acute onset of intractable nausea and vomiting with associated generalized weakness. noted for recent hospital admission.   OT comments  Upon entering the room, pt supine in bed engaged in B LE exercises with PT. Pt is agreeable to attempting further mobility. Pt performs bed mobility with max A of 2. Pt with heavy L lateral lean is sitting requiring max - total A . Multiple attempts to stand with max A of 2 and L knee blocking with manual facilitation for upright positioning. Pt continues to benefit from OT intervention and continued recommendation for SNF at discharge.   Recommendations for follow up therapy are one component of a multi-disciplinary discharge planning process, led by the attending physician.  Recommendations may be updated based on patient status, additional functional criteria and insurance authorization.    Follow Up Recommendations  Skilled nursing-short term rehab (<3 hours/day)    Assistance Recommended at Discharge Frequent or constant Supervision/Assistance  Patient can return home with the following  A lot of help with walking and/or transfers;A lot of help with bathing/dressing/bathroom;Assist for transportation;Help with stairs or ramp for entrance;Assistance with cooking/housework   Equipment Recommendations  Other (comment) (defer to next venue of care)       Precautions / Restrictions Precautions Precautions: Fall Restrictions Weight Bearing Restrictions: No       Mobility Bed Mobility Overal  bed mobility: Needs Assistance Bed Mobility: Sit to Supine, Rolling Rolling: Max assist   Supine to sit: Max assist, +2 for safety/equipment, HOB elevated Sit to supine: Max assist, +2 for physical assistance, HOB elevated        Transfers Overall transfer level: Needs assistance Equipment used: Rolling walker (2 wheels) Transfers: Sit to/from Stand Sit to Stand: Max assist, +2 physical assistance           General transfer comment: twice from EOB, maxAx2     Balance Overall balance assessment: Needs assistance Sitting-balance support: Feet supported, Bilateral upper extremity supported Sitting balance-Leahy Scale: Zero Sitting balance - Comments: needed constant hands on assist due to strong L lateral lean. pt able to minimally correct. Postural control: Left lateral lean Standing balance support: Bilateral upper extremity supported, During functional activity, Reliant on assistive device for balance Standing balance-Leahy Scale: Zero Standing balance comment: Required manual facilitation to stand upright; L knee buckling, very unsteady.                           ADL either performed or assessed with clinical judgement   ADL Overall ADL's : Needs assistance/impaired                                       General ADL Comments: total A    Extremity/Trunk Assessment Upper Extremity Assessment Upper Extremity Assessment: Generalized weakness   Lower Extremity Assessment Lower Extremity Assessment: Generalized weakness        Vision Patient Visual Report: No change from baseline            Cognition Arousal/Alertness:  Awake/alert Behavior During Therapy: WFL for tasks assessed/performed, Restless Overall Cognitive Status: Within Functional Limits for tasks assessed                                                     Pertinent Vitals/ Pain       Pain Assessment Pain Assessment: No/denies pain          Frequency  Min 2X/week        Progress Toward Goals  OT Goals(current goals can now be found in the care plan section)  Progress towards OT goals: Progressing toward goals  Acute Rehab OT Goals Patient Stated Goal: to get stronger OT Goal Formulation: With patient Time For Goal Achievement: 05/11/22 Potential to Achieve Goals: Good  Plan Frequency remains appropriate       AM-PAC OT "6 Clicks" Daily Activity     Outcome Measure   Help from another person eating meals?: A Little Help from another person taking care of personal grooming?: A Little Help from another person toileting, which includes using toliet, bedpan, or urinal?: Total Help from another person bathing (including washing, rinsing, drying)?: Total Help from another person to put on and taking off regular upper body clothing?: Total Help from another person to put on and taking off regular lower body clothing?: Total 6 Click Score: 10    End of Session Equipment Utilized During Treatment: Rolling walker (2 wheels)  OT Visit Diagnosis: Other abnormalities of gait and mobility (R26.89);Muscle weakness (generalized) (M62.81)   Activity Tolerance Patient limited by fatigue   Patient Left in bed;with call bell/phone within reach;with bed alarm set;with nursing/sitter in room   Nurse Communication Mobility status        Time: 1430-1445 OT Time Calculation (min): 15 min  Charges: OT General Charges $OT Visit: 1 Visit OT Treatments $Therapeutic Activity: 8-22 mins  Darleen Crocker, MS, OTR/L , CBIS ascom [phone]  04/29/22, 4:20 PM

## 2022-04-29 NOTE — Procedures (Signed)
PROCEDURE SUMMARY:  Successful image-guided left thoracentesis. Yielded 350 of watery, straw-colored fluid. Pt tolerated procedure well. No immediate complications. EBL = trace   Specimen was sent for labs. CXR ordered.  Please see imaging section of Epic for full dictation.  Lura Em PA-C 04/29/2022 11:49 AM

## 2022-04-30 DIAGNOSIS — I509 Heart failure, unspecified: Secondary | ICD-10-CM

## 2022-04-30 DIAGNOSIS — E785 Hyperlipidemia, unspecified: Secondary | ICD-10-CM | POA: Diagnosis not present

## 2022-04-30 DIAGNOSIS — N179 Acute kidney failure, unspecified: Secondary | ICD-10-CM | POA: Diagnosis not present

## 2022-04-30 DIAGNOSIS — I5031 Acute diastolic (congestive) heart failure: Secondary | ICD-10-CM

## 2022-04-30 DIAGNOSIS — R112 Nausea with vomiting, unspecified: Secondary | ICD-10-CM | POA: Diagnosis not present

## 2022-04-30 DIAGNOSIS — I4891 Unspecified atrial fibrillation: Secondary | ICD-10-CM | POA: Diagnosis not present

## 2022-04-30 LAB — BASIC METABOLIC PANEL
Anion gap: 6 (ref 5–15)
BUN: 33 mg/dL — ABNORMAL HIGH (ref 8–23)
CO2: 26 mmol/L (ref 22–32)
Calcium: 8.8 mg/dL — ABNORMAL LOW (ref 8.9–10.3)
Chloride: 110 mmol/L (ref 98–111)
Creatinine, Ser: 2.12 mg/dL — ABNORMAL HIGH (ref 0.44–1.00)
GFR, Estimated: 24 mL/min — ABNORMAL LOW (ref >60)
Glucose, Bld: 103 mg/dL — ABNORMAL HIGH (ref 70–99)
Potassium: 4.9 mmol/L (ref 3.5–5.1)
Sodium: 142 mmol/L (ref 135–145)

## 2022-04-30 LAB — GLUCOSE, CAPILLARY
Glucose-Capillary: 87 mg/dL (ref 70–99)
Glucose-Capillary: 102 mg/dL — ABNORMAL HIGH (ref 70–99)
Glucose-Capillary: 179 mg/dL — ABNORMAL HIGH (ref 70–99)
Glucose-Capillary: 228 mg/dL — ABNORMAL HIGH (ref 70–99)

## 2022-04-30 LAB — LACTATE DEHYDROGENASE: LDH: 169 U/L (ref 98–192)

## 2022-04-30 MED ORDER — PANTOPRAZOLE SODIUM 40 MG PO TBEC
40.0000 mg | DELAYED_RELEASE_TABLET | Freq: Two times a day (BID) | ORAL | Status: DC
  Administered 2022-04-30 – 2022-05-02 (×4): 40 mg via ORAL
  Filled 2022-04-30 (×4): qty 1

## 2022-04-30 NOTE — Plan of Care (Signed)
  Problem: Education: Goal: Ability to describe self-care measures that may prevent or decrease complications (Diabetes Survival Skills Education) will improve Outcome: Progressing Goal: Individualized Educational Video(s) Outcome: Progressing   Problem: Coping: Goal: Ability to adjust to condition or change in health will improve Outcome: Progressing   Problem: Fluid Volume: Goal: Ability to maintain a balanced intake and output will improve Outcome: Progressing   Problem: Health Behavior/Discharge Planning: Goal: Ability to identify and utilize available resources and services will improve Outcome: Progressing Goal: Ability to manage health-related needs will improve Outcome: Progressing   Problem: Metabolic: Goal: Ability to maintain appropriate glucose levels will improve Outcome: Progressing   Problem: Nutritional: Goal: Maintenance of adequate nutrition will improve Outcome: Progressing Goal: Progress toward achieving an optimal weight will improve Outcome: Progressing   Problem: Skin Integrity: Goal: Risk for impaired skin integrity will decrease Outcome: Progressing   Problem: Tissue Perfusion: Goal: Adequacy of tissue perfusion will improve Outcome: Progressing   Problem: Education: Goal: Knowledge of General Education information will improve Description: Including pain rating scale, medication(s)/side effects and non-pharmacologic comfort measures Outcome: Progressing   Problem: Clinical Measurements: Goal: Ability to maintain clinical measurements within normal limits will improve Outcome: Progressing Goal: Will remain free from infection Outcome: Progressing Goal: Diagnostic test results will improve Outcome: Progressing Goal: Respiratory complications will improve Outcome: Progressing Goal: Cardiovascular complication will be avoided Outcome: Progressing   Problem: Activity: Goal: Risk for activity intolerance will decrease Outcome: Progressing    Problem: Nutrition: Goal: Adequate nutrition will be maintained Outcome: Progressing   Problem: Coping: Goal: Level of anxiety will decrease Outcome: Progressing   Problem: Elimination: Goal: Will not experience complications related to bowel motility Outcome: Progressing Goal: Will not experience complications related to urinary retention Outcome: Progressing   Problem: Pain Managment: Goal: General experience of comfort will improve Outcome: Progressing   Problem: Safety: Goal: Ability to remain free from injury will improve Outcome: Progressing   Problem: Skin Integrity: Goal: Risk for impaired skin integrity will decrease Outcome: Progressing

## 2022-04-30 NOTE — Progress Notes (Signed)
PROGRESS NOTE    Jean Johnson   YCX:[id number] DOB: 07-Apr-1951  DOA: 04/25/2022 Date of Service: 04/30/22 PCP: Gladstone Lighter, MD     Brief Narrative / Hospital Course:  Jean Johnson is a 71 y.o. 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  She was initially admitted here and discharged two days ago during which she was managed for COVID-19, acute on chronic diastolic CHF and AKI, on that admission she refused SNF placement on discharge..  She continued to feel weak after discharge and has been having N/V. 10/16: admitted for IV fluids and IV antiemetics. SNF placement pending.  10/17: Significantly hypoglycemic this morning, corrected with D50.  TOC pending. Maintains on IV fluids. PT eval pending.  10/18: PT recommending SNF, placement pending. AKI, keep on fluids  10/19: AKI worsening despite fluids, nephrology consulted --> Maintain on IV fluids. Restarted insulin sensitive sliding scale   10/20: SOB overnight, crackles per RN, night coverage ordered CXR, concerning for increased pulm edema / effusion. Echo ordered. BNP added to AM labs. Thoracentesis ordered, Eliquis held. IV fluid rate reduced and dose Lasix administered per nephrology Cr improved somewhat on AM labs.  BNP came back with elevation at 567. 10/21: Creatinine slightly improved but not back at baseline.  Shortness of breath has essentially resolved, patient still feeling significant fatigue.  Echo resulted, LVEF 60 to 65%, normal LV function, no RWMA,(+) LVH, diastolic function could not be evaluated, RV systolic function moderately reduced, RV mildly  enlarged.  LA/RA severely dilated.  Small pericardial effusion. Labs from pleural fluid transudative.   Consultants:  none  Procedures: 04/29/22 thoracentesis yielded 350 mL watery straw colored fluid, labs transudate       ASSESSMENT & PLAN:   Principal Problem:   Intractable nausea and vomiting Active Problems:   Atrial fibrillation with rapid ventricular response (HCC)   Hypoglycemia   Type 2 diabetes mellitus with peripheral neuropathy (HCC)   Renal transplant recipient   Dyslipidemia   Pressure injury of skin   AKI (acute kidney injury) ([place]) on CKD 3b/4   Pleural effusion due to CHF (congestive heart failure) (HCC)   Acute heart failure with preserved ejection fraction (HFpEF) (HCC)   Generalized weakness due to deconditioning Intractable nausea and vomiting -improved IV fluids to transition to p.o. as able / pending AKI and nephro recs  as needed antiemetics  IV PPI therapy with Protonix. PT/OT --> PT recs SNF  Acute Kidney Injury on CKD 3b/4 likely d/t dehydration - improving  Consult nephrology 10/19 --> continue IV fluids 10/20-10/21 Cr trending down 10/20 pleural edema/effusion --> thoracentesis yielded 350 mL, reduced rate IV fluids appreciate further nephrology recs --> lasix given x1  Clinical CHF w/o history - HFpEF Heart size mildly enlarged, acute pulmonary edema, pulmonary effusion likely due to IV fluid resuscitation to treat issues as above BNP --> slight elevation, CKD may be contributing Echo --> LVEF 60 to 65%, normal LV function, no RWMA,(+) LVH, diastolic function could not be evaluated, RV systolic function moderately reduced, RV mildly enlarged.  LA/RA severely dilated.  Small pericardial effusion. Reduced IV fluids rate, AKI in hx renal transplant patient, nephrology following --> received 1 dose diuresis yesterday  Consider cardiology consult, pending BNP/Echo and nephro recs --> symptoms improving and echo is not showing severe abnormality,  can follow outpatient Diuretics as needed/per nephrology  Transudative Pleural effusion Most likely due to HFpEF Status post thoracentesis with removal of 350 mL On follow-up CXR, question skinfold versus mild pneumothorax on imaging, monitor respiratory status  Atrial fibrillation - stable continue flecainide, Cardizem CD. continue Eliquis. as needed IV Cardizem as needed for rapid rate.  Type 2 diabetes mellitus with peripheral neuropathy (HCC) Hypoglycemia - resolved Restarted insulin sensitive sliding scale    Renal transplant recipient continue Prograf and CellCept as well as prednisone.  Dyslipidemia continue statin therapy and Zetia.    DVT prophylaxis: Patient is on Eliquis for A-fib Pertinent IV fluids/nutrition: IV fluids LR 75 mL/h. Central lines / invasive devices: None  Code Status: Full code Family Communication: None at this time  Disposition: observation --> inpatient TOC needs: SNF placement  Barriers to discharge / significant pending items: Monitoring glucose, SNF placement pending --> can go over the weekend, AKI , pending nephrology input re: d/c readiness              Subjective:  Patient reports SOB is better but feeling quite weak still. No CP.        Objective:  Vitals:   04/29/22 1133 04/29/22 1511 04/29/22 1915 04/30/22 0454  BP: 133/63 136/60 136/65 (!) 141/57  Pulse: (!) 56 62 96 (!) 56  Resp:  20 17 18   Temp:  97.8 F (36.6 C) 98 F (36.7 C) 97.9 F (36.6 C)  TempSrc:  Oral  Oral  SpO2: 97% 100% 94% 100%  Weight:      Height:        Intake/Output Summary (Last 24 hours) at 04/30/2022 1338 Last data filed at 04/30/2022 9211 Gross per 24 hour  Intake 2278.36 ml  Output 500 ml  Net 1778.36 ml   Filed Weights   04/25/22 1658  Weight: 50.8 kg    Examination:  Constitutional:  VS as above General Appearance: alert, frail but not cachectic, NAD Respiratory: Normal respiratory effort No wheeze No  rhonchi No rales Cardiovascular: S1/S2 normal +murmur Irreg/irreg rhythm Reg rate WNL No rub/gallop auscultated No lower extremity edema Gastrointestinal: No tenderness Musculoskeletal:  No clubbing/cyanosis of digits Symmetrical movement in all extremities Neurological: No cranial nerve deficit on limited exam Alert Psychiatric: Normal judgment/insight Normal mood and affect       Scheduled Medications:   atorvastatin  10 mg Oral Daily   cyanocobalamin  2,000 mcg Oral Daily   dextrose  1 ampule Intravenous Once   diltiazem  240 mg Oral QHS   ezetimibe  10 mg Oral Daily   flecainide  100 mg Oral Q12H   insulin aspart  0-6 Units Subcutaneous TID WC   mycophenolate  500 mg Oral BID   pantoprazole  40 mg Oral BID   predniSONE  5 mg Oral Q breakfast   pregabalin  50 mg Oral BID   tacrolimus  3 mg Oral BID    Continuous Infusions:  lactated ringers 75 mL/hr at 04/30/22 0643     PRN Medications:  acetaminophen **OR** acetaminophen, albuterol, guaiFENesin, magnesium hydroxide, meclizine, ondansetron **OR** ondansetron (ZOFRAN) IV, traZODone  Antimicrobials:  Anti-infectives (From admission, onward)    None       Data Reviewed: I have personally reviewed following labs and imaging studies  CBC: Recent Labs  Lab 04/25/22 1222 04/26/22 0823 04/27/22 0528 04/29/22 0458  WBC 10.1 7.6  7.5 9.7  HGB 11.7* 8.6* 9.2* 10.0*  HCT 38.2 28.0* 29.3* 32.3*  MCV 97.7 98.6 95.4 97.9  PLT 232 140* 169 333   Basic Metabolic Panel: Recent Labs  Lab 04/25/22 1249 04/26/22 0823 04/27/22 0528 04/28/22 0432 04/29/22 0458 04/30/22 0526  NA  --  141 141 140 140 142  K  --  3.9 3.9 4.5 4.0 4.9  CL  --  112* 114* 108 110 110  CO2  --  15* 22 24 23 26   GLUCOSE  --  36* 120* 193* 118* 103*  BUN  --  29* 31* 40* 34* 33*  CREATININE  --  1.49* 2.42* 2.55* 2.46* 2.12*  CALCIUM  --  8.1* 8.1* 8.4* 8.3* 8.8*  MG 2.1  --   --   --   --   --    GFR: Estimated Creatinine  Clearance: 17 mL/min (A) (by C-G formula based on SCr of 2.12 mg/dL (H)). Liver Function Tests: No results for input(s): "AST", "ALT", "ALKPHOS", "BILITOT", "PROT", "ALBUMIN" in the last 168 hours. No results for input(s): "LIPASE", "AMYLASE" in the last 168 hours. No results for input(s): "AMMONIA" in the last 168 hours. Coagulation Profile: No results for input(s): "INR", "PROTIME" in the last 168 hours. Cardiac Enzymes: No results for input(s): "CKTOTAL", "CKMB", "CKMBINDEX", "TROPONINI" in the last 168 hours. BNP (last 3 results) No results for input(s): "PROBNP" in the last 8760 hours. HbA1C: No results for input(s): "HGBA1C" in the last 72 hours. CBG: Recent Labs  Lab 04/29/22 1240 04/29/22 1637 04/29/22 2102 04/30/22 0849 04/30/22 1141  GLUCAP 128* 119* 227* 87 102*   Lipid Profile: No results for input(s): "CHOL", "HDL", "LDLCALC", "TRIG", "CHOLHDL", "LDLDIRECT" in the last 72 hours. Thyroid Function Tests: No results for input(s): "TSH", "T4TOTAL", "FREET4", "T3FREE", "THYROIDAB" in the last 72 hours. Anemia Panel: No results for input(s): "VITAMINB12", "FOLATE", "FERRITIN", "TIBC", "IRON", "RETICCTPCT" in the last 72 hours. Urine analysis:    Component Value Date/Time   COLORURINE STRAW (A) 04/17/2022 1406   APPEARANCEUR CLEAR (A) 04/17/2022 1406   APPEARANCEUR [place] (A) 11/16/2020 1344   LABSPEC 1.012 04/17/2022 1406   PHURINE 5.0 04/17/2022 1406   GLUCOSEU NEGATIVE 04/17/2022 1406   HGBUR MODERATE (A) 04/17/2022 1406   BILIRUBINUR NEGATIVE 04/17/2022 1406   BILIRUBINUR Negative 11/16/2020 [place] 04/17/2022 1406   PROTEINUR 100 (A) 04/17/2022 1406   NITRITE NEGATIVE 04/17/2022 1406   LEUKOCYTESUR NEGATIVE 04/17/2022 1406   Sepsis Labs: @LABRCNTIP (procalcitonin:4,lacticidven:4)  Recent Results (from the past 240 hour(s))  Body fluid culture w Gram Stain     Status: None (Preliminary result)   Collection Time: 04/29/22 11:25 AM    Specimen: PATH Cytology Pleural fluid  Result Value Ref Range Status   Specimen Description   Final    PLEURAL Performed at [facility], [address], [place], [place] [address]    Special Requests   Final    NONE Performed at [facility], [address] [place], [place] [address]    Gram Stain   Final    RARE WBC PRESENT, PREDOMINANTLY MONONUCLEAR NO ORGANISMS SEEN    Culture   Final    NO GROWTH < 24 HOURS Performed at [facility], [address] [place], [place] [address]    Report Status PENDING  Incomplete         Radiology Studies: DG Chest 2 View  Result Date: 04/25/2022 CLINICAL DATA:  Weakness EXAM: CHEST - 2  VIEW COMPARISON:  04/17/2022, 04/14/2022 FINDINGS: Small bilateral pleural effusions. Cardiomegaly with vascular congestion. Overall improved aeration of left chest compared with 04/17/2022. No pneumothorax. Aortic atherosclerosis. IMPRESSION: Cardiomegaly with small bilateral effusions and central congestion. Overall improved aeration of left thorax compared with 04/17/2022 Electronically Signed   By: Donavan Foil M.D.   On: 04/25/2022 17:52            LOS: 4 days       Emeterio Reeve, DO [facility] 04/30/2022, 1:38 PM   Staff may message me via secure chat in [place]  but this may not receive immediate response,  please page for urgent matters!  If 7PM-7AM, please contact night-coverage [email or web address]  Dictation software was used to generate the above note. Typos may occur and escape review, as with typed/written notes. Please contact Dr Sheppard Coil directly for clarity if needed.

## 2022-04-30 NOTE — Progress Notes (Signed)
[facility]  PROGRESS NOTE   Subjective:   Patient seen at bedside.  Comfortable at this time.  Objective:  Vital signs: Blood pressure (!) 141/57, pulse (!) 56, temperature 97.9 F (36.6 C), temperature source Oral, resp. rate 18, height 5' (1.524 m), weight 50.8 kg, SpO2 100 %.  Intake/Output Summary (Last 24 hours) at 04/30/2022 1618 Last data filed at 04/30/2022 0643 Gross per 24 hour  Intake 2278.36 ml  Output 500 ml  Net 1778.36 ml   Filed Weights   04/25/22 1658  Weight: 50.8 kg     Physical Exam: General:  No acute distress  Head:  Normocephalic, atraumatic. Moist oral mucosal membranes  Eyes:  Anicteric  Neck:  Supple  Lungs:   Clear to auscultation, normal effort  Heart:  S1S2 no rubs  Abdomen:   Soft, nontender, bowel sounds present  Extremities:  peripheral edema.  Neurologic:  Awake, alert, following commands  Skin:  No lesions  Access:     Basic Metabolic Panel: Recent Labs  Lab 04/25/22 1249 04/26/22 0823 04/27/22 0528 04/28/22 0432 04/29/22 0458 04/30/22 0526  NA  --  141 141 140 140 142  K  --  3.9 3.9 4.5 4.0 4.9  CL  --  112* 114* 108 110 110  CO2  --  15* 22 24 23 26   GLUCOSE  --  36* 120* 193* 118* 103*  BUN  --  29* 31* 40* 34* 33*  CREATININE  --  1.49* 2.42* 2.55* 2.46* 2.12*  CALCIUM  --  8.1* 8.1* 8.4* 8.3* 8.8*  MG 2.1  --   --   --   --   --     CBC: Recent Labs  Lab 04/25/22 1222 04/26/22 0823 04/27/22 0528 04/29/22 0458  WBC 10.1 7.6 7.5 9.7  HGB 11.7* 8.6* 9.2* 10.0*  HCT 38.2 28.0* 29.3* 32.3*  MCV 97.7 98.6 95.4 97.9  PLT 232 140* 169 201     Urinalysis: No results for input(s): "COLORURINE", "LABSPEC", "PHURINE", "GLUCOSEU", "HGBUR", "BILIRUBINUR", "KETONESUR", "PROTEINUR", "UROBILINOGEN", "NITRITE", "LEUKOCYTESUR" in the last 72 hours.  Invalid input(s): "APPERANCEUR"    Imaging: US THORACENTESIS ASP PLEURAL SPACE W/IMG GUIDE  Result Date: 04/29/2022 INDICATION: Left pleural effusion  EXAM: ULTRASOUND GUIDED LEFT THORACENTESIS MEDICATIONS: 9 cc 1% lidocaine COMPLICATIONS: None immediate. PROCEDURE: An ultrasound guided thoracentesis was thoroughly discussed with the patient and questions answered. The benefits, risks, alternatives and complications were also discussed. The patient understands and wishes to proceed with the procedure. Written consent was obtained. Ultrasound was performed to localize and mark an adequate pocket of fluid in the left chest. The area was then prepped and draped in the normal sterile fashion. 1% Lidocaine was used for local anesthesia. Under ultrasound guidance a 6 Fr Safe-T-Centesis catheter was introduced. Thoracentesis was performed. The catheter was removed and a dressing applied. FINDINGS: A total of approximately 350 mL of watery yellow fluid was removed. Samples were sent to the laboratory as requested by the clinical team. IMPRESSION: Successful ultrasound guided left thoracentesis yielding 350 mL of pleural fluid. Follow-up chest x-ray revealed no evidence of left-sided pneumothorax.; a suspected skin fold was over the apex of the right lung, however the reading radiologist recommended attention on follow-up to exclude a pneumothorax. Read by: Reatha Armour, PA-C Electronically Signed   By: Jacqulynn Cadet M.D.   On: 04/29/2022 16:51   ECHOCARDIOGRAM COMPLETE  Result Date: 04/29/2022    ECHOCARDIOGRAM REPORT   Patient Name:   Jean HINCH  Johnson Date of Exam: 04/29/2022 Medical Rec #:  [id number]           Height:       60.0 in Accession #:    [id number]          Weight:       112.0 lb Date of Birth:  04/05/1951           BSA:          1.459 m Patient Age:    21 years            BP:           133/63 mmHg Patient Gender: F                   HR:           56 bpm. Exam Location:  [facility] Procedure: 2D Echo, Cardiac Doppler and Color Doppler Indications:     CHF-acute systolic K02.54  History:         Patient has no prior history of Echocardiogram  examinations.                  Arrythmias:Atrial Fibrillation, Signs/Symptoms:Murmur; Risk                  Factors:Hypertension.  Sonographer:     Sherrie Sport Referring Phys:  [id number] Emeterio Reeve Diagnosing Phys: Neoma Laming  Sonographer Comments: Technically challenging study due to limited acoustic windows and no apical window. IMPRESSIONS  1. Left ventricular ejection fraction, by estimation, is 60 to 65%. The left ventricle has normal function. The left ventricle has no regional wall motion abnormalities. There is severe concentric left ventricular hypertrophy. Left ventricular diastolic  function could not be evaluated.  2. Right ventricular systolic function is moderately reduced. The right ventricular size is mildly enlarged. Severely increased right ventricular wall thickness.  3. Left atrial size was severely dilated.  4. Right atrial size was severely dilated.  5. A small pericardial effusion is present. The pericardial effusion is circumferential.  6. The mitral valve is grossly normal. Mild mitral valve regurgitation.  7. The aortic valve is calcified. Aortic valve regurgitation is not visualized. Aortic valve sclerosis is present, with no evidence of aortic valve stenosis. FINDINGS  Left Ventricle: Left ventricular ejection fraction, by estimation, is 60 to 65%. The left ventricle has normal function. The left ventricle has no regional wall motion abnormalities. The left ventricular internal cavity size was small. There is severe concentric left ventricular hypertrophy. Left ventricular diastolic function could not be evaluated. Right Ventricle: The right ventricular size is mildly enlarged. Severely increased right ventricular wall thickness. Right ventricular systolic function is moderately reduced. Left Atrium: Left atrial size was severely dilated. Right Atrium: Right atrial size was severely dilated. Pericardium: A small pericardial effusion is present. The pericardial effusion is  circumferential. Mitral Valve: The mitral valve is grossly normal. Mild mitral valve regurgitation. Tricuspid Valve: The tricuspid valve is grossly normal. Tricuspid valve regurgitation is mild. Aortic Valve: The aortic valve is calcified. Aortic valve regurgitation is not visualized. Aortic valve sclerosis is present, with no evidence of aortic valve stenosis. Pulmonic Valve: The pulmonic valve was grossly normal. Pulmonic valve regurgitation is trivial. Aorta: The aortic root, ascending aorta and aortic arch are all structurally normal, with no evidence of dilitation or obstruction. IAS/Shunts: No atrial level shunt detected by color flow Doppler.  LEFT VENTRICLE PLAX 2D LVIDd:         4.10 cm  LVIDs:         2.80 cm LV PW:         1.20 cm LV IVS:        1.00 cm LVOT diam:     1.90 cm LVOT Area:     2.84 cm   AORTA Ao Root diam: 2.50 cm  SHUNTS Systemic Diam: 1.90 cm Neoma Laming Electronically signed by Neoma Laming Signature Date/Time: 04/29/2022/3:39:13 PM    Final    DG Chest Port 1 View  Result Date: 04/29/2022 CLINICAL DATA:  Status post left thoracentesis. EXAM: PORTABLE CHEST 1 VIEW COMPARISON:  Chest radiograph 04/29/2022 at 6:54 a.m. FINDINGS: The cardiac silhouette remains enlarged with similar appearance of pulmonary vascular congestion and interstitial and patchy airspace opacities in both lungs. There is improved aeration of the left lung base status post thoracentesis, likely with at most a small pleural effusion remaining. A trace right pleural effusion persists. No left-sided pneumothorax is identified. Curvilinear density projecting over the right lung apex appears to extend beyond the lung margins and is favored to reflect a skin fold, with suggestion of lung markings also extending beyond this line in the apex. Prior cervical spine fusion is noted. IMPRESSION: 1. Improved aeration of the left lung base status post thoracentesis. No left-sided pneumothorax. 2. Suspected skin fold overlying  the right lung apex. Attention on follow-up to exclude a pneumothorax. 3. Unchanged pulmonary edema. Electronically Signed   By: Logan Bores M.D.   On: 04/29/2022 11:55   DG Chest Port 1 View  Result Date: 04/29/2022 CLINICAL DATA:  71 year old female with history of crackles in the lungs and dry cough. EXAM: PORTABLE CHEST 1 VIEW COMPARISON:  Chest x-ray 04/25/2022. FINDINGS: Compared to the prior examination there is now large cephalization of the pulmonary vasculature, indistinct interstitial markings and patchy ill-defined opacities throughout the lungs bilaterally, suggestive of worsening pulmonary edema. Opacity at the left base which may reflect atelectasis and/or consolidation, with superimposed moderate to large left pleural effusion. Probable trace right pleural effusion. No pneumothorax. Heart size is mildly enlarged. Upper mediastinal contours are within normal limits. Atherosclerotic calcifications in the thoracic aorta. Orthopedic fixation hardware in the lower cervical spine incompletely imaged. IMPRESSION: 1. The appearance of the chest suggests worsening congestive heart failure, as above. 2. Increasing atelectasis and/or consolidation in the base of the left lung with increasing moderate to large left pleural effusion. Probable trace right pleural effusion. 3. Aortic atherosclerosis. Electronically Signed   By: Vinnie Langton M.D.   On: 04/29/2022 07:06     Medications:    lactated ringers 75 mL/hr at 04/30/22 7628    atorvastatin  10 mg Oral Daily   cyanocobalamin  2,000 mcg Oral Daily   dextrose  1 ampule Intravenous Once   diltiazem  240 mg Oral QHS   ezetimibe  10 mg Oral Daily   flecainide  100 mg Oral Q12H   insulin aspart  0-6 Units Subcutaneous TID WC   mycophenolate  500 mg Oral BID   pantoprazole  40 mg Oral BID   predniSONE  5 mg Oral Q breakfast   pregabalin  50 mg Oral BID   tacrolimus  3 mg Oral BID    Assessment/ Plan:     Principal Problem:    Intractable nausea and vomiting Active Problems:   Hypoglycemia   Atrial fibrillation with rapid ventricular response (HCC)   Type 2 diabetes mellitus with peripheral neuropathy ([place])   Renal transplant recipient   Dyslipidemia  Pressure injury of skin   AKI (acute kidney injury) ([place]) on CKD 3b/4   Pleural effusion due to CHF (congestive heart failure) (HCC)   Acute heart failure with preserved ejection fraction (HFpEF) ([place])  71 year old female with history of end-stage renal disease status posttransplant day 2000 subsequent rejection in 2006.  Later she had second renal transplant in 2010.  She has a baseline creatinine of about 1.7.  She is now admitted with history of generalized weakness after COVID infection recently.  #1: Acute kidney injury: Possibly secondary to prerenal azotemia complicated by sepsis.  We will continue to monitor closely.  We will continue Ringer's lactate at 75 cc an hour.  #2: History of kidney transplant: We will continue the immunosuppressive's with tacrolimus, prednisone and mycophenolate.  We may need to check tacrolimus levels.  #3: Anemia: Patient has anemia secondary to chronic kidney disease which is stable at this time.  #4: Pleural effusion: Patient is s/p thoracentesis.  #5: Congestive heart failure: Patient is advised on the importance of fluid restriction.  We will continue the furosemide at the present doses.  We will continue to follow closely.   LOS: [place], [facility] 10/21/20234:18 PM

## 2022-05-01 DIAGNOSIS — N179 Acute kidney failure, unspecified: Secondary | ICD-10-CM | POA: Diagnosis not present

## 2022-05-01 DIAGNOSIS — I4891 Unspecified atrial fibrillation: Secondary | ICD-10-CM | POA: Diagnosis not present

## 2022-05-01 DIAGNOSIS — E785 Hyperlipidemia, unspecified: Secondary | ICD-10-CM | POA: Diagnosis not present

## 2022-05-01 DIAGNOSIS — R112 Nausea with vomiting, unspecified: Secondary | ICD-10-CM | POA: Diagnosis not present

## 2022-05-01 LAB — BASIC METABOLIC PANEL
Anion gap: 5 (ref 5–15)
BUN: 32 mg/dL — ABNORMAL HIGH (ref 8–23)
CO2: 25 mmol/L (ref 22–32)
Calcium: 8.7 mg/dL — ABNORMAL LOW (ref 8.9–10.3)
Chloride: 112 mmol/L — ABNORMAL HIGH (ref 98–111)
Creatinine, Ser: 2.04 mg/dL — ABNORMAL HIGH (ref 0.44–1.00)
GFR, Estimated: 25 mL/min — ABNORMAL LOW (ref >60)
Glucose, Bld: 166 mg/dL — ABNORMAL HIGH (ref 70–99)
Potassium: 4.9 mmol/L (ref 3.5–5.1)
Sodium: 142 mmol/L (ref 135–145)

## 2022-05-01 LAB — GLUCOSE, CAPILLARY
Glucose-Capillary: 126 mg/dL — ABNORMAL HIGH (ref 70–99)
Glucose-Capillary: 98 mg/dL (ref 70–99)
Glucose-Capillary: 115 mg/dL — ABNORMAL HIGH (ref 70–99)
Glucose-Capillary: 147 mg/dL — ABNORMAL HIGH (ref 70–99)

## 2022-05-01 MED ORDER — PANTOPRAZOLE SODIUM 40 MG PO TBEC: 40.0000 mg | DELAYED_RELEASE_TABLET | Freq: Two times a day (BID) | ORAL | 0 refills | Status: DC

## 2022-05-01 MED ORDER — OXYCODONE HCL 5 MG PO TABS: 5.0000 mg | ORAL_TABLET | Freq: Four times a day (QID) | ORAL | 0 refills | Status: AC | PRN

## 2022-05-01 MED ORDER — CYCLOBENZAPRINE HCL 10 MG PO TABS
5.0000 mg | ORAL_TABLET | Freq: Three times a day (TID) | ORAL | Status: DC | PRN
  Administered 2022-05-01 (×2): 5 mg via ORAL
  Filled 2022-05-01 (×2): qty 1

## 2022-05-01 MED ORDER — ACETAMINOPHEN 325 MG PO TABS: 650.0000 mg | ORAL_TABLET | Freq: Four times a day (QID) | ORAL | Status: DC | PRN

## 2022-05-01 MED ORDER — ALBUTEROL SULFATE HFA 108 (90 BASE) MCG/ACT IN AERS: 2.0000 | INHALATION_SPRAY | Freq: Four times a day (QID) | RESPIRATORY_TRACT | 0 refills | Status: DC | PRN

## 2022-05-01 MED ORDER — APIXABAN 2.5 MG PO TABS
2.5000 mg | ORAL_TABLET | Freq: Two times a day (BID) | ORAL | Status: DC
  Administered 2022-05-01 – 2022-05-02 (×3): 2.5 mg via ORAL
  Filled 2022-05-01 (×3): qty 1

## 2022-05-01 MED ORDER — GUAIFENESIN ER 600 MG PO TB12: 1200.0000 mg | ORAL_TABLET | Freq: Two times a day (BID) | ORAL | Status: DC | PRN

## 2022-05-01 MED ORDER — PREGABALIN 50 MG PO CAPS: 50.0000 mg | ORAL_CAPSULE | Freq: Two times a day (BID) | ORAL | 0 refills | Status: DC

## 2022-05-01 MED ORDER — TRAZODONE HCL 50 MG PO TABS: 25.0000 mg | ORAL_TABLET | Freq: Every evening | ORAL | 0 refills | Status: DC | PRN

## 2022-05-01 MED ORDER — APIXABAN 2.5 MG PO TABS: 2.5000 mg | ORAL_TABLET | Freq: Two times a day (BID) | ORAL | 0 refills | Status: DC

## 2022-05-01 NOTE — Progress Notes (Signed)
[facility]  PROGRESS NOTE   Subjective:   More awake today.  Feels better and finished her breakfast. Patient is still on IV fluids.  Objective:  Vital signs: Blood pressure (!) 148/65, pulse 67, temperature 97.7 F (36.5 C), temperature source Oral, resp. rate 12, height 5' (1.524 m), weight 50.8 kg, SpO2 100 %.  Intake/Output Summary (Last 24 hours) at 05/01/2022 1109 Last data filed at 05/01/2022 1024 Gross per 24 hour  Intake 1429.77 ml  Output 300 ml  Net 1129.77 ml   Filed Weights   04/25/22 1658  Weight: 50.8 kg     Physical Exam: General:  No acute distress  Head:  Normocephalic, atraumatic. Moist oral mucosal membranes  Eyes:  Anicteric  Neck:  Supple  Lungs:   Clear to auscultation, normal effort  Heart:  S1S2 no rubs  Abdomen:   Soft, nontender, bowel sounds present  Extremities:  peripheral edema.  Neurologic:  Awake, alert, following commands  Skin:  No lesions  Access:     Basic Metabolic Panel: Recent Labs  Lab 04/25/22 1249 04/26/22 0823 04/27/22 0528 04/28/22 0432 04/29/22 0458 04/30/22 0526 05/01/22 0500  NA  --    < > 141 140 140 142 142  K  --    < > 3.9 4.5 4.0 4.9 4.9  CL  --    < > 114* 108 110 110 112*  CO2  --    < > 22 24 23 26 25   GLUCOSE  --    < > 120* 193* 118* 103* 166*  BUN  --    < > 31* 40* 34* 33* 32*  CREATININE  --    < > 2.42* 2.55* 2.46* 2.12* 2.04*  CALCIUM  --    < > 8.1* 8.4* 8.3* 8.8* 8.7*  MG 2.1  --   --   --   --   --   --    < > = values in this interval not displayed.    CBC: Recent Labs  Lab 04/25/22 1222 04/26/22 0823 04/27/22 0528 04/29/22 0458  WBC 10.1 7.6 7.5 9.7  HGB 11.7* 8.6* 9.2* 10.0*  HCT 38.2 28.0* 29.3* 32.3*  MCV 97.7 98.6 95.4 97.9  PLT 232 140* 169 201     Urinalysis: No results for input(s): "COLORURINE", "LABSPEC", "PHURINE", "GLUCOSEU", "HGBUR", "BILIRUBINUR", "KETONESUR", "PROTEINUR", "UROBILINOGEN", "NITRITE", "LEUKOCYTESUR" in the last 72  hours.  Invalid input(s): "APPERANCEUR"    Imaging: US THORACENTESIS ASP PLEURAL SPACE W/IMG GUIDE  Result Date: 04/29/2022 INDICATION: Left pleural effusion EXAM: ULTRASOUND GUIDED LEFT THORACENTESIS MEDICATIONS: 9 cc 1% lidocaine COMPLICATIONS: None immediate. PROCEDURE: An ultrasound guided thoracentesis was thoroughly discussed with the patient and questions answered. The benefits, risks, alternatives and complications were also discussed. The patient understands and wishes to proceed with the procedure. Written consent was obtained. Ultrasound was performed to localize and mark an adequate pocket of fluid in the left chest. The area was then prepped and draped in the normal sterile fashion. 1% Lidocaine was used for local anesthesia. Under ultrasound guidance a 6 Fr Safe-T-Centesis catheter was introduced. Thoracentesis was performed. The catheter was removed and a dressing applied. FINDINGS: A total of approximately 350 mL of watery yellow fluid was removed. Samples were sent to the laboratory as requested by the clinical team. IMPRESSION: Successful ultrasound guided left thoracentesis yielding 350 mL of pleural fluid. Follow-up chest x-ray revealed no evidence of left-sided pneumothorax.; a suspected skin fold was over the apex of the right  lung, however the reading radiologist recommended attention on follow-up to exclude a pneumothorax. Read by: Reatha Armour, PA-C Electronically Signed   By: Jacqulynn Cadet M.D.   On: 04/29/2022 16:51   ECHOCARDIOGRAM COMPLETE  Result Date: 04/29/2022    ECHOCARDIOGRAM REPORT   Patient Name:   Jean Johnson Date of Exam: 04/29/2022 Medical Rec #:  [id number]           Height:       60.0 in Accession #:    [id number]          Weight:       112.0 lb Date of Birth:  09/30/1950           BSA:          1.459 m Patient Age:    65 years            BP:           133/63 mmHg Patient Gender: F                   HR:           56 bpm. Exam Location:  [facility]  Procedure: 2D Echo, Cardiac Doppler and Color Doppler Indications:     CHF-acute systolic S50.53  History:         Patient has no prior history of Echocardiogram examinations.                  Arrythmias:Atrial Fibrillation, Signs/Symptoms:Murmur; Risk                  Factors:Hypertension.  Sonographer:     Sherrie Sport Referring Phys:  [id number] Emeterio Reeve Diagnosing Phys: Neoma Laming  Sonographer Comments: Technically challenging study due to limited acoustic windows and no apical window. IMPRESSIONS  1. Left ventricular ejection fraction, by estimation, is 60 to 65%. The left ventricle has normal function. The left ventricle has no regional wall motion abnormalities. There is severe concentric left ventricular hypertrophy. Left ventricular diastolic  function could not be evaluated.  2. Right ventricular systolic function is moderately reduced. The right ventricular size is mildly enlarged. Severely increased right ventricular wall thickness.  3. Left atrial size was severely dilated.  4. Right atrial size was severely dilated.  5. A small pericardial effusion is present. The pericardial effusion is circumferential.  6. The mitral valve is grossly normal. Mild mitral valve regurgitation.  7. The aortic valve is calcified. Aortic valve regurgitation is not visualized. Aortic valve sclerosis is present, with no evidence of aortic valve stenosis. FINDINGS  Left Ventricle: Left ventricular ejection fraction, by estimation, is 60 to 65%. The left ventricle has normal function. The left ventricle has no regional wall motion abnormalities. The left ventricular internal cavity size was small. There is severe concentric left ventricular hypertrophy. Left ventricular diastolic function could not be evaluated. Right Ventricle: The right ventricular size is mildly enlarged. Severely increased right ventricular wall thickness. Right ventricular systolic function is moderately reduced. Left Atrium: Left atrial size was  severely dilated. Right Atrium: Right atrial size was severely dilated. Pericardium: A small pericardial effusion is present. The pericardial effusion is circumferential. Mitral Valve: The mitral valve is grossly normal. Mild mitral valve regurgitation. Tricuspid Valve: The tricuspid valve is grossly normal. Tricuspid valve regurgitation is mild. Aortic Valve: The aortic valve is calcified. Aortic valve regurgitation is not visualized. Aortic valve sclerosis is present, with no evidence of aortic valve stenosis. Pulmonic Valve: The pulmonic valve  was grossly normal. Pulmonic valve regurgitation is trivial. Aorta: The aortic root, ascending aorta and aortic arch are all structurally normal, with no evidence of dilitation or obstruction. IAS/Shunts: No atrial level shunt detected by color flow Doppler.  LEFT VENTRICLE PLAX 2D LVIDd:         4.10 cm LVIDs:         2.80 cm LV PW:         1.20 cm LV IVS:        1.00 cm LVOT diam:     1.90 cm LVOT Area:     2.84 cm   AORTA Ao Root diam: 2.50 cm  SHUNTS Systemic Diam: 1.90 cm Neoma Laming Electronically signed by Neoma Laming Signature Date/Time: 04/29/2022/3:39:13 PM    Final    DG Chest Port 1 View  Result Date: 04/29/2022 CLINICAL DATA:  Status post left thoracentesis. EXAM: PORTABLE CHEST 1 VIEW COMPARISON:  Chest radiograph 04/29/2022 at 6:54 a.m. FINDINGS: The cardiac silhouette remains enlarged with similar appearance of pulmonary vascular congestion and interstitial and patchy airspace opacities in both lungs. There is improved aeration of the left lung base status post thoracentesis, likely with at most a small pleural effusion remaining. A trace right pleural effusion persists. No left-sided pneumothorax is identified. Curvilinear density projecting over the right lung apex appears to extend beyond the lung margins and is favored to reflect a skin fold, with suggestion of lung markings also extending beyond this line in the apex. Prior cervical spine fusion  is noted. IMPRESSION: 1. Improved aeration of the left lung base status post thoracentesis. No left-sided pneumothorax. 2. Suspected skin fold overlying the right lung apex. Attention on follow-up to exclude a pneumothorax. 3. Unchanged pulmonary edema. Electronically Signed   By: Logan Bores M.D.   On: 04/29/2022 11:55     Medications:    lactated ringers 75 mL/hr at 05/01/22 7616    atorvastatin  10 mg Oral Daily   cyanocobalamin  2,000 mcg Oral Daily   dextrose  1 ampule Intravenous Once   diltiazem  240 mg Oral QHS   ezetimibe  10 mg Oral Daily   flecainide  100 mg Oral Q12H   insulin aspart  0-6 Units Subcutaneous TID WC   mycophenolate  500 mg Oral BID   pantoprazole  40 mg Oral BID   predniSONE  5 mg Oral Q breakfast   pregabalin  50 mg Oral BID   tacrolimus  3 mg Oral BID    Assessment/ Plan:     Principal Problem:   Intractable nausea and vomiting Active Problems:   Hypoglycemia   Atrial fibrillation with rapid ventricular response (HCC)   Type 2 diabetes mellitus with peripheral neuropathy (HCC)   Renal transplant recipient   Dyslipidemia   Pressure injury of skin   AKI (acute kidney injury) ([place]) on CKD 3b/4   Pleural effusion due to CHF (congestive heart failure) ([place])   Acute heart failure with preserved ejection fraction (HFpEF) ([place])  71 year old female with history of end-stage renal disease status posttransplant day 2000 subsequent rejection in 2006.  Later she had second renal transplant in 2010.  She has a baseline creatinine of about 1.7.  She is now admitted with history of generalized weakness after COVID infection recently.   #1: Acute kidney injury: Possibly secondary to prerenal azotemia complicated by sepsis.  We will continue to monitor closely.  We will continue Ringer's lactate at 75 cc an hour.   #2: History of kidney  transplant: We will continue the immunosuppressive's with tacrolimus, prednisone and mycophenolate.  We may need to check  tacrolimus levels.   #3: Anemia: Patient has anemia secondary to chronic kidney disease which is stable at this time.   #4: Pleural effusion: Patient is s/p thoracentesis.   #5: Congestive heart failure: Patient is advised on the importance of fluid restriction.  We will continue the furosemide at the present doses.  Discharge planning to SNF.   We will continue to follow closely.   LOS: [place], MD [facility] 10/22/202311:09 AM

## 2022-05-01 NOTE — Progress Notes (Signed)
PROGRESS NOTE    Jean Johnson   JQB:[id number] DOB: 18-Sep-1950  DOA: 04/25/2022 Date of Service: 05/01/22 PCP: Gladstone Lighter, MD     Brief Narrative / Hospital Course:  Jean Johnson is a 71 y.o. 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  She was initially admitted here and discharged two days ago during which she was managed for COVID-19, acute on chronic diastolic CHF and AKI, on that admission she refused SNF placement on discharge..  She continued to feel weak after discharge and has been having N/V. 10/16: admitted for IV fluids and IV antiemetics. SNF placement pending.  10/17: Significantly hypoglycemic this morning, corrected with D50.  TOC pending. Maintains on IV fluids. PT eval pending.  10/18: PT recommending SNF, placement pending. AKI, keep on fluids  10/19: AKI worsening despite fluids, nephrology consulted --> Maintain on IV fluids. Restarted insulin sensitive sliding scale   10/20: SOB overnight, crackles per RN, night coverage ordered CXR, concerning for increased pulm edema / effusion. Echo ordered. BNP added to AM labs. Thoracentesis ordered, Eliquis held. IV fluid rate reduced and dose Lasix administered per nephrology Cr improved somewhat on AM labs.  BNP came back with elevation at 567. 10/21: Creatinine slightly improved but not back at baseline.  Shortness of breath has essentially resolved, patient still feeling significant fatigue.  Echo resulted, LVEF 60 to 65%, normal LV function, no RWMA,(+) LVH, diastolic function could not be evaluated, RV systolic function moderately reduced, RV mildly  enlarged.  LA/RA severely dilated.  Small pericardial effusion. Labs from pleural fluid transudative.  10/22: Creatinine continues to improve, per nephrology via secure chat keep through tonight on LR 75 mL/h and recheck in a.m.  Consultants:  none  Procedures: 04/29/22 thoracentesis yielded 350 mL watery straw colored fluid, labs transudate       ASSESSMENT & PLAN:   Principal Problem:   Intractable nausea and vomiting Active Problems:   Atrial fibrillation with rapid ventricular response (HCC)   Hypoglycemia   Type 2 diabetes mellitus with peripheral neuropathy (HCC)   Renal transplant recipient   Dyslipidemia   Pressure injury of skin   AKI (acute kidney injury) ([place]) on CKD 3b/4   Pleural effusion due to CHF (congestive heart failure) (HCC)   Acute heart failure with preserved ejection fraction (HFpEF) (HCC)   Generalized weakness due to deconditioning Intractable nausea and vomiting -improved IV fluids to transition to p.o. as able / pending AKI and nephro recs  as needed antiemetics  IV PPI therapy with Protonix. PT/OT --> PT recs SNF  Acute Kidney Injury on CKD 3b/4 likely d/t dehydration - improving  Consult nephrology 10/19 --> continue IV fluids 10/20-10/21 Cr trending down 10/20 pleural edema/effusion --> thoracentesis yielded 350 mL, reduced rate IV fluids appreciate further nephrology recs --> lasix given x1 10/21-10/22 Creatinine continues to improve, per nephrology via secure chat keep through tonight on LR 75 mL/h and recheck in a.m.  Clinical CHF w/o history - HFpEF Heart size mildly enlarged, acute pulmonary edema, pulmonary effusion likely due to IV fluid resuscitation to treat issues as above BNP --> slight elevation, CKD may be contributing Echo --> LVEF 60 to 65%, normal LV function, no  RWMA,(+) LVH, diastolic function could not be evaluated, RV systolic function moderately reduced, RV mildly enlarged.  LA/RA severely dilated.  Small pericardial  effusion. Reduced IV fluids rate, AKI in hx renal transplant patient, nephrology following --> received 1 dose diuresis yesterday Consider cardiology consult, pending BNP/Echo and nephro recs --> symptoms improving and echo is not showing severe abnormality, can follow outpatient Diuretics as needed/per nephrology  Transudative Pleural effusion Most likely due to HFpEF Status post thoracentesis with removal of 350 mL On follow-up CXR, question skinfold versus mild pneumothorax on imaging, monitor respiratory status  Atrial fibrillation - stable continue flecainide, Cardizem CD. continue Eliquis. as needed IV Cardizem for rapid rate.  Type 2 diabetes mellitus with peripheral neuropathy (HCC) Hypoglycemia - resolved Restarted insulin sensitive sliding scale    Renal transplant recipient continue Prograf and CellCept as well as prednisone.  Dyslipidemia continue statin therapy and Zetia.    DVT prophylaxis: Patient is on Eliquis for A-fib Pertinent IV fluids/nutrition: IV fluids LR 75 mL/h. Central lines / invasive devices: None  Code Status: Full code Family Communication: Updated husband yesterday  Disposition: observation --> inpatient TOC needs: SNF placement  Barriers to discharge / significant pending items: Per nephrology, if creatinine continuing to improve tomorrow can hopefully DC IV fluids and discharge to SNF             Subjective:  Patient reports SOB is better but feeling quite weak still. No CP.  Reports some back soreness       Objective:  Vitals:   04/30/22 1627 04/30/22 2017 05/01/22 0426 05/01/22 0735  BP: (!) 140/70 137/78 138/72 (!) 148/65  Pulse:  73 67 67  Resp:  16 16 12   Temp: 98.6 F (37 C) 97.9 F (36.6 C) 98.6 F (37 C) 97.7 F (36.5 C)  TempSrc:  Oral Oral Oral  SpO2: 100% 98% 100% 100%  Weight:      Height:        Intake/Output Summary (Last 24 hours) at 05/01/2022 1345 Last data filed at 05/01/2022 1024 Gross  per 24 hour  Intake 1429.77 ml  Output 300 ml  Net 1129.77 ml   Filed Weights   04/25/22 1658  Weight: 50.8 kg    Examination:  Constitutional:  VS as above General Appearance: alert, frail but not cachectic, NAD Respiratory: Normal respiratory effort No wheeze No rhonchi No rales Cardiovascular: S1/S2 normal +murmur Irreg/irreg rhythm Reg rate WNL No rub/gallop auscultated No lower extremity edema Gastrointestinal: No tenderness Musculoskeletal:  No clubbing/cyanosis of digits Symmetrical movement in all extremities Tenderness to palpation, tightness paraspinal muscles around T5-T10, bilaterally, no midline tenderness. Neurological: No cranial nerve deficit on limited exam Alert Psychiatric: Normal judgment/insight Normal mood and affect       Scheduled Medications:   apixaban  2.5 mg Oral BID   atorvastatin  10 mg Oral Daily   cyanocobalamin  2,000 mcg Oral Daily   dextrose  1 ampule Intravenous Once   diltiazem  240 mg Oral QHS   ezetimibe  10 mg Oral Daily   flecainide  100 mg Oral Q12H   insulin aspart  0-6 Units Subcutaneous TID WC   mycophenolate  500 mg Oral BID   pantoprazole  40 mg Oral BID   predniSONE  5 mg Oral Q breakfast   pregabalin  50 mg Oral BID   tacrolimus  3 mg Oral BID    Continuous Infusions:  lactated ringers 75 mL/hr at 05/01/22 0432     PRN  Medications:  acetaminophen **OR** acetaminophen, albuterol, cyclobenzaprine, guaiFENesin, magnesium hydroxide, meclizine, ondansetron **OR** ondansetron (ZOFRAN) IV, traZODone  Antimicrobials:  Anti-infectives (From admission, onward)    None       Data Reviewed: I have personally reviewed following labs and imaging studies  CBC: Recent Labs  Lab 04/25/22 1222 04/26/22 0823 04/27/22 0528 04/29/22 0458  WBC 10.1 7.6 7.5 9.7  HGB 11.7* 8.6* 9.2* 10.0*  HCT 38.2 28.0* 29.3* 32.3*  MCV 97.7 98.6 95.4 97.9  PLT 232 140* 169 979   Basic Metabolic Panel: Recent Labs   Lab 04/25/22 1249 04/26/22 0823 04/27/22 0528 04/28/22 0432 04/29/22 0458 04/30/22 0526 05/01/22 0500  NA  --    < > 141 140 140 142 142  K  --    < > 3.9 4.5 4.0 4.9 4.9  CL  --    < > 114* 108 110 110 112*  CO2  --    < > 22 24 23 26 25   GLUCOSE  --    < > 120* 193* 118* 103* 166*  BUN  --    < > 31* 40* 34* 33* 32*  CREATININE  --    < > 2.42* 2.55* 2.46* 2.12* 2.04*  CALCIUM  --    < > 8.1* 8.4* 8.3* 8.8* 8.7*  MG 2.1  --   --   --   --   --   --    < > = values in this interval not displayed.   GFR: Estimated Creatinine Clearance: 17.6 mL/min (A) (by C-G formula based on SCr of 2.04 mg/dL (H)). Liver Function Tests: No results for input(s): "AST", "ALT", "ALKPHOS", "BILITOT", "PROT", "ALBUMIN" in the last 168 hours. No results for input(s): "LIPASE", "AMYLASE" in the last 168 hours. No results for input(s): "AMMONIA" in the last 168 hours. Coagulation Profile: No results for input(s): "INR", "PROTIME" in the last 168 hours. Cardiac Enzymes: No results for input(s): "CKTOTAL", "CKMB", "CKMBINDEX", "TROPONINI" in the last 168 hours. BNP (last 3 results) No results for input(s): "PROBNP" in the last 8760 hours. HbA1C: No results for input(s): "HGBA1C" in the last 72 hours. CBG: Recent Labs  Lab 04/30/22 1141 04/30/22 1647 04/30/22 2155 05/01/22 0736 05/01/22 1158  GLUCAP 102* 179* 228* 126* 98   Lipid Profile: No results for input(s): "CHOL", "HDL", "LDLCALC", "TRIG", "CHOLHDL", "LDLDIRECT" in the last 72 hours. Thyroid Function Tests: No results for input(s): "TSH", "T4TOTAL", "FREET4", "T3FREE", "THYROIDAB" in the last 72 hours. Anemia Panel: No results for input(s): "VITAMINB12", "FOLATE", "FERRITIN", "TIBC", "IRON", "RETICCTPCT" in the last 72 hours. Urine analysis:    Component Value Date/Time   COLORURINE STRAW (A) 04/17/2022 1406   APPEARANCEUR CLEAR (A) 04/17/2022 1406   APPEARANCEUR Hazy (A) 11/16/2020 1344   LABSPEC 1.012 04/17/2022 1406   PHURINE  5.0 04/17/2022 1406   GLUCOSEU NEGATIVE 04/17/2022 1406   HGBUR MODERATE (A) 04/17/2022 1406   BILIRUBINUR NEGATIVE 04/17/2022 1406   BILIRUBINUR Negative 11/16/2020 [place] 04/17/2022 1406   PROTEINUR 100 (A) 04/17/2022 1406   NITRITE NEGATIVE 04/17/2022 1406   LEUKOCYTESUR NEGATIVE 04/17/2022 1406   Sepsis Labs: @LABRCNTIP (procalcitonin:4,lacticidven:4)  Recent Results (from the past 240 hour(s))  Body fluid culture w Gram Stain     Status: None (Preliminary result)   Collection Time: 04/29/22 11:25 AM   Specimen: PATH Cytology Pleural fluid  Result Value Ref Range Status   Specimen Description   Final    PLEURAL Performed at [facility], [address] [place], [place] [address]    Special Requests   Final    NONE Performed at [facility], [address] [place], [place] [address]    Gram Stain   Final    RARE WBC PRESENT, PREDOMINANTLY MONONUCLEAR NO ORGANISMS SEEN    Culture   Final    NO GROWTH 2 DAYS Performed at [facility], [place] [address] [place], [place] [address]    Report Status PENDING  Incomplete         Radiology Studies: DG Chest 2 View  Result Date: 04/25/2022 CLINICAL DATA:  Weakness EXAM: CHEST - 2 VIEW COMPARISON:  04/17/2022, 04/14/2022 FINDINGS: Small bilateral pleural effusions. Cardiomegaly with vascular congestion. Overall improved aeration of left chest compared with 04/17/2022. No pneumothorax. Aortic atherosclerosis. IMPRESSION: Cardiomegaly with small bilateral effusions and central congestion. Overall improved aeration of left thorax compared with 04/17/2022 Electronically Signed   By: Donavan Foil M.D.   On: 04/25/2022 17:52            LOS: 5 days       Emeterio Reeve, DO [facility] 05/01/2022, 1:45 PM   Staff may message me via secure chat in [place]  but this may not receive immediate response,  please page for urgent matters!  If 7PM-7AM, please contact  night-coverage [email or web address]  Dictation software was used to generate the above note. Typos may occur and escape review, as with typed/written notes. Please contact Dr Sheppard Coil directly for clarity if needed.

## 2022-05-02 DIAGNOSIS — N179 Acute kidney failure, unspecified: Secondary | ICD-10-CM | POA: Diagnosis not present

## 2022-05-02 DIAGNOSIS — I5031 Acute diastolic (congestive) heart failure: Secondary | ICD-10-CM | POA: Diagnosis not present

## 2022-05-02 DIAGNOSIS — N184 Chronic kidney disease, stage 4 (severe): Secondary | ICD-10-CM

## 2022-05-02 DIAGNOSIS — R112 Nausea with vomiting, unspecified: Secondary | ICD-10-CM | POA: Diagnosis not present

## 2022-05-02 LAB — GLUCOSE, CAPILLARY
Glucose-Capillary: 94 mg/dL (ref 70–99)
Glucose-Capillary: 145 mg/dL — ABNORMAL HIGH (ref 70–99)
Glucose-Capillary: 142 mg/dL — ABNORMAL HIGH (ref 70–99)

## 2022-05-02 LAB — CBC
HCT: 32 % — ABNORMAL LOW (ref 36.0–46.0)
Hemoglobin: 10.2 g/dL — ABNORMAL LOW (ref 12.0–15.0)
MCH: 30.4 pg (ref 26.0–34.0)
MCHC: 31.9 g/dL (ref 30.0–36.0)
MCV: 95.5 fL (ref 80.0–100.0)
Platelets: 234 10*3/uL (ref 150–400)
RBC: 3.35 MIL/uL — ABNORMAL LOW (ref 3.87–5.11)
RDW: 14.2 % (ref 11.5–15.5)
WBC: 9 10*3/uL (ref 4.0–10.5)
nRBC: 0 % (ref 0.0–0.2)

## 2022-05-02 LAB — BASIC METABOLIC PANEL
Anion gap: 5 (ref 5–15)
BUN: 28 mg/dL — ABNORMAL HIGH (ref 8–23)
CO2: 24 mmol/L (ref 22–32)
Calcium: 8.7 mg/dL — ABNORMAL LOW (ref 8.9–10.3)
Chloride: 112 mmol/L — ABNORMAL HIGH (ref 98–111)
Creatinine, Ser: 1.79 mg/dL — ABNORMAL HIGH (ref 0.44–1.00)
GFR, Estimated: 30 mL/min — ABNORMAL LOW (ref >60)
Glucose, Bld: 76 mg/dL (ref 70–99)
Potassium: 4.4 mmol/L (ref 3.5–5.1)
Sodium: 141 mmol/L (ref 135–145)

## 2022-05-02 NOTE — Care Management Important Message (Signed)
Important Message  Patient Details  Name: Jean Johnson MRN: [id number] Date of Birth: 1950/11/12   Medicare Important Message Given:  Yes     Dannette Barbara 05/02/2022, 3:42 PM

## 2022-05-02 NOTE — Plan of Care (Signed)
IV removed, report called to [place] at [facility].  Patient will be going to room 805.  Family has been notified that patient will be transported via EMS

## 2022-05-02 NOTE — Progress Notes (Addendum)
Physical Therapy Treatment Patient Details Name: Jean Johnson MRN: [id number] DOB: 1951/02/03 Today's Date: 05/02/2022   History of Present Illness Pt is a 71 y.o. African-American female with medical history significant for paroxysmal atrial fibrillation on Eliquis, type 2 diabetes mellitus with diabetic neuropathy, end-stage renal disease, s/p renal transplant twice in 2000 and 2010, dyslipidemia and hypertension, who presented to the emergency room with acute onset of intractable nausea and vomiting with associated generalized weakness. noted for recent hospital admission.    PT Comments    Pt alert, reported 7/10 back pain at site of thoracentesis, agreeable to PT. Seen by PT/OT to maximize function, safety, and participation. Pt with some improvement compared to previous session. Still required maxAx2 to come to EOB. A few instances of ability to maintain midline (with significant assist to achieve midline), but predominantly poor sitting balance due to L lateral lean. Sit <> stand with RW and maxAx2 twice, but required hands on facilitation of trunk extension, bilateral knee block and constant cues for posture and weight bearing. Returned to supine maxAx2 and participated in LLE exercises AAROM. The patient would benefit from further skilled PT intervention to continue to progress towards goals as able. Recommendation remains appropriate.      Recommendations for follow up therapy are one component of a multi-disciplinary discharge planning process, led by the attending physician.  Recommendations may be updated based on patient status, additional functional criteria and insurance authorization.  Follow Up Recommendations  Skilled nursing-short term rehab (<3 hours/day) Can patient physically be transported by private vehicle: No   Assistance Recommended at Discharge Frequent or constant Supervision/Assistance  Patient can return home with the following Assistance with  cooking/housework;A lot of help with bathing/dressing/bathroom;Direct supervision/assist for medications management;Assist for transportation;Help with stairs or ramp for entrance;Two people to help with walking and/or transfers;Two people to help with bathing/dressing/bathroom   Equipment Recommendations  Other (comment) (TBD)    Recommendations for Other Services       Precautions / Restrictions Precautions Precautions: Fall Restrictions Weight Bearing Restrictions: No     Mobility  Bed Mobility Overal bed mobility: Needs Assistance Bed Mobility: Supine to Sit, Sit to Supine     Supine to sit: Max assist, +2 for safety/equipment, HOB elevated Sit to supine: Max assist, +2 for physical assistance        Transfers Overall transfer level: Needs assistance Equipment used: Rolling walker (2 wheels) Transfers: Sit to/from Stand Sit to Stand: Max assist, +2 physical assistance                Ambulation/Gait               General Gait Details: unable to truly ambulate   Stairs             Wheelchair Mobility    Modified Rankin (Stroke Patients Only)       Balance Overall balance assessment: Needs assistance Sitting-balance support: Feet supported, Bilateral upper extremity supported Sitting balance-Leahy Scale: Poor Sitting balance - Comments: instances of poor balance, majority of sitting pt needed maxA due to strong L lateral lean Postural control: Left lateral lean Standing balance support: Bilateral upper extremity supported, During functional activity, Reliant on assistive device for balance Standing balance-Leahy Scale: Zero Standing balance comment: bilateral knee block L more than R                            Cognition Arousal/Alertness: Awake/alert Behavior During  Therapy: WFL for tasks assessed/performed Overall Cognitive Status: Within Functional Limits for tasks assessed                                           Exercises Other Exercises Other Exercises: AAROM LLE knee flexion, SAQ, hip abduction/adduction, hip IR/ER x10, on LLE    General Comments        Pertinent Vitals/Pain Pain Assessment Pain Assessment: 0-10 Pain Score: 7  Pain Location: back Pain Descriptors / Indicators: Aching, Sore Pain Intervention(s): Limited activity within patient's tolerance, Monitored during session, Repositioned    Home Living                          Prior Function            PT Goals (current goals can now be found in the care plan section) Progress towards PT goals: Progressing toward goals (slowly)    Frequency    Min 2X/week      PT Plan Current plan remains appropriate    Co-evaluation PT/OT/SLP Co-Evaluation/Treatment: Yes Reason for Co-Treatment: For patient/therapist safety;To address functional/ADL transfers PT goals addressed during session: Mobility/safety with mobility;Balance;Proper use of DME;Strengthening/ROM OT goals addressed during session: ADL's and self-care;Proper use of Adaptive equipment and DME      AM-PAC PT "6 Clicks" Mobility   Outcome Measure  Help needed turning from your back to your side while in a flat bed without using bedrails?: A Lot Help needed moving from lying on your back to sitting on the side of a flat bed without using bedrails?: A Lot Help needed moving to and from a bed to a chair (including a wheelchair)?: Total Help needed standing up from a chair using your arms (e.g., wheelchair or bedside chair)?: Total Help needed to walk in hospital room?: Total Help needed climbing 3-5 steps with a railing? : Total 6 Click Score: 8    End of Session Equipment Utilized During Treatment: Gait belt Activity Tolerance: Patient limited by fatigue Patient left: in bed;with call bell/phone within reach;with bed alarm set   PT Visit Diagnosis: Difficulty in walking, not elsewhere classified (R26.2);Muscle weakness (generalized)  (M62.81)     Time: 3875-6433 PT Time Calculation (min) (ACUTE ONLY): 23 min  Charges:  $Therapeutic Exercise: 8-22 mins                     Lieutenant Diego PT, DPT 3:26 PM,05/02/22

## 2022-05-02 NOTE — Progress Notes (Signed)
Occupational Therapy Treatment Patient Details Name: Jean Johnson MRN: [id number] DOB: 09/07/1950 Today's Date: 05/02/2022   History of present illness is a 71 y.o. African-American female with medical history significant for paroxysmal atrial fibrillation on Eliquis, type 2 diabetes mellitus with diabetic neuropathy, end-stage renal disease, s/p renal transplant twice in 2000 and 2010, dyslipidemia and hypertension, who presented to the emergency room with acute onset of intractable nausea and vomiting with associated generalized weakness. noted for recent hospital admission.   OT comments  Pt seen for OT tx and co-tx with PT to optimize ADL mobility and safety. Pt agreeable, received with L lateral lean in the bed. Pt required MAX A +2 for safety with sup>sit EOB, with notable zero initial static sitting balance requiring MAX A to maintain and correct for strong L lateral lean/LOB with VC and assist to correct. Pt unable to maintain sustained cervical extension to improve sitting posture. Pt requiring some VC/TC for BUE positioning to support improved static sitting balance. Pt required MAX A +2 to stand twice, with L knee blocked and never fully able to correct for posterior LOB. Pt returned to bed with MAX A +2. Pt left with PT for additional therapy. Continue to recommend SNF at this time.   Recommendations for follow up therapy are one component of a multi-disciplinary discharge planning process, led by the attending physician.  Recommendations may be updated based on patient status, additional functional criteria and insurance authorization.    Follow Up Recommendations  Skilled nursing-short term rehab (<3 hours/day)    Assistance Recommended at Discharge Frequent or constant Supervision/Assistance  Patient can return home with the following  A lot of help with bathing/dressing/bathroom;Assist for transportation;Help with stairs or ramp for entrance;Assistance with  cooking/housework;Two people to help with walking and/or transfers   Equipment Recommendations  Other (comment) (defer to next venue)    Recommendations for Other Services      Precautions / Restrictions Precautions Precautions: Fall Restrictions Weight Bearing Restrictions: No       Mobility Bed Mobility Overal bed mobility: Needs Assistance Bed Mobility: Supine to Sit, Sit to Supine     Supine to sit: Max assist, +2 for safety/equipment, HOB elevated Sit to supine: Max assist, +2 for physical assistance   General bed mobility comments: VC for bed rail use, sequencing    Transfers Overall transfer level: Needs assistance Equipment used: Rolling walker (2 wheels) Transfers: Sit to/from Stand Sit to Stand: Max assist, +2 physical assistance           General transfer comment: MAX A +2 with L knee blocked twice with VC for posture, hand placement, addressing posterior lean     Balance Overall balance assessment: Needs assistance Sitting-balance support: Feet supported, Bilateral upper extremity supported Sitting balance-Leahy Scale: Poor Sitting balance - Comments: Pt required SBA-MAX A to maintain static sitting balance with strong L lateral lean and decreased ability to self correct Postural control: Left lateral lean Standing balance support: Bilateral upper extremity supported, During functional activity, Reliant on assistive device for balance Standing balance-Leahy Scale: Zero                             ADL either performed or assessed with clinical judgement   ADL  Extremity/Trunk Assessment              Vision       Perception     Praxis      Cognition Arousal/Alertness: Awake/alert Behavior During Therapy: WFL for tasks assessed/performed Overall Cognitive Status: Within Functional Limits for tasks assessed                                           Exercises      Shoulder Instructions       General Comments      Pertinent Vitals/ Pain       Pain Assessment Pain Assessment: No/denies pain  Home Living                                          Prior Functioning/Environment              Frequency  Min 2X/week        Progress Toward Goals  OT Goals(current goals can now be found in the care plan section)  Progress towards OT goals: Progressing toward goals  Acute Rehab OT Goals Patient Stated Goal: get stronger OT Goal Formulation: With patient Time For Goal Achievement: 05/11/22 Potential to Achieve Goals: Good  Plan Frequency remains appropriate;Discharge plan remains appropriate    Co-evaluation    PT/OT/SLP Co-Evaluation/Treatment: Yes Reason for Co-Treatment: To address functional/ADL transfers;For patient/therapist safety PT goals addressed during session: Mobility/safety with mobility;Balance;Proper use of DME OT goals addressed during session: ADL's and self-care;Proper use of Adaptive equipment and DME      AM-PAC OT "6 Clicks" Daily Activity     Outcome Measure   Help from another person eating meals?: A Little Help from another person taking care of personal grooming?: A Little Help from another person toileting, which includes using toliet, bedpan, or urinal?: Total Help from another person bathing (including washing, rinsing, drying)?: Total Help from another person to put on and taking off regular upper body clothing?: Total Help from another person to put on and taking off regular lower body clothing?: Total 6 Click Score: 10    End of Session Equipment Utilized During Treatment: Gait belt;Rolling walker (2 wheels)  OT Visit Diagnosis: Other abnormalities of gait and mobility (R26.89);Muscle weakness (generalized) (M62.81)   Activity Tolerance Patient tolerated treatment well   Patient Left in bed;with call bell/phone within reach;Other (comment) (PT for  additional tx)   Nurse Communication          Time: 4098-1191 OT Time Calculation (min): 12 min  Charges: OT General Charges $OT Visit: 1 Visit OT Treatments $Therapeutic Activity: 8-22 mins  Ardeth Perfect., MPH, MS, OTR/L ascom [phone] 05/02/22, 2:39 PM

## 2022-05-02 NOTE — NC FL2 (Signed)
[place] LEVEL OF CARE SCREENING TOOL     IDENTIFICATION  Patient Name: Jean Johnson Birthdate: February 04, 1951 Sex: female Admission Date (Current Location): 04/25/2022  [facility] and [place] Number:  Engineering geologist and Address:         Provider Number: [id number]  Attending Physician Name and Address:  Annita Brod, MD  Relative Name and Phone Number:       Current Level of Care: SNF Recommended Level of Care: [place] Prior Approval Number:    Date Approved/Denied:   PASRR Number: [id number]  Discharge Plan: SNF    Current Diagnoses: Patient Active Problem List   Diagnosis Date Noted   Pleural effusion due to CHF (congestive heart failure) ([place]) 04/30/2022   Acute heart failure with preserved ejection fraction (HFpEF) ([place]) 04/30/2022   Pressure injury of skin 04/27/2022   AKI (acute kidney injury) ([place]) on CKD 3b/4 04/27/2022   Intractable nausea and vomiting 04/25/2022   Atrial fibrillation with rapid ventricular response ([place]) 04/25/2022   Type 2 diabetes mellitus with peripheral neuropathy ([place]) 04/25/2022   Renal transplant recipient 04/25/2022   Dyslipidemia 04/25/2022   Generalized weakness    Acute on chronic diastolic CHF (congestive heart failure) ([place]) 83/15/1761   Metabolic acidosis 60/73/7106   Acute urinary retention 04/18/2022   Syncope 04/14/2022   Atrial fibrillation, chronic ([place]) 04/14/2022   Type II diabetes mellitus with renal manifestations ([place]) 04/14/2022   HLD (hyperlipidemia)    Hypokalemia    Acute bronchitis due to COVID-19 virus    Acute renal failure superimposed on stage 3a chronic kidney disease ([facility])    Myocardial injury    Hypoglycemia    Cervical myelopathy ([place]) 03/24/2021   Anemia 12/03/2020   Essential hypertension 12/03/2020   Hypothyroidism 12/03/2020   Type 2 diabetes mellitus ([place]) 12/03/2020   Atrial fibrillation ([place]) 09/21/2018   Kidney transplant status  09/21/2018   Osteoporosis 09/21/2018    Orientation RESPIRATION BLADDER Height & Weight     Self, Time, Situation, Place  Normal Continent Weight: 50.8 kg Height:  5' (152.4 cm)  BEHAVIORAL SYMPTOMS/MOOD NEUROLOGICAL BOWEL NUTRITION STATUS      Continent Diet (Carb modified)  AMBULATORY STATUS COMMUNICATION OF NEEDS Skin   Extensive Assist Verbally PU Stage and Appropriate Care                       Personal Care Assistance Level of Assistance              Functional Limitations Info             SPECIAL CARE FACTORS FREQUENCY  PT (By licensed PT), OT (By licensed OT)                    Contractures Contractures Info: Not present    Additional Factors Info  Code Status, Allergies Code Status Info: Full Allergies Info: E-mycin (Erythromycin), Penicillins           Current Medications (05/02/2022):  This is the current hospital active medication list Current Facility-Administered Medications  Medication Dose Route Frequency Provider Last Rate Last Admin   acetaminophen (TYLENOL) tablet 650 mg  650 mg Oral Q6H PRN Mansy, Jan A, MD       Or   acetaminophen (TYLENOL) suppository 650 mg  650 mg Rectal Q6H PRN Mansy, Jan A, MD       albuterol (VENTOLIN HFA) 108 (90 Base) MCG/ACT inhaler 2 puff  2 puff Inhalation Q6H PRN Emeterio Reeve, DO       apixaban Arne [place]) tablet 2.5 mg  2.5 mg Oral BID Emeterio Reeve, DO   2.5 mg at 05/02/22 0786   atorvastatin (LIPITOR) tablet 10 mg  10 mg Oral Daily Mansy, Jan A, MD   10 mg at 05/02/22 7544   cyanocobalamin (VITAMIN B12) tablet 2,000 mcg  2,000 mcg Oral Daily Mansy, Jan A, MD   2,000 mcg at 05/02/22 9201   cyclobenzaprine (FLEXERIL) tablet 5 mg  5 mg Oral TID PRN Emeterio Reeve, DO   5 mg at 05/01/22 2309   dextrose 50 % solution 50 mL  1 ampule Intravenous Once Emeterio Reeve, DO       diltiazem (CARDIZEM CD) 24 hr capsule 240 mg  240 mg Oral QHS Mansy, Jan A, MD   240 mg at 05/01/22 2308    ezetimibe (ZETIA) tablet 10 mg  10 mg Oral Daily Mansy, Jan A, MD   10 mg at 05/02/22 0071   flecainide (TAMBOCOR) tablet 100 mg  100 mg Oral Q12H Mansy, Jan A, MD   100 mg at 05/02/22 0927   guaiFENesin (MUCINEX) 12 hr tablet 1,200 mg  1,200 mg Oral BID PRN Mansy, Jan A, MD   1,200 mg at 04/29/22 0513   insulin aspart (novoLOG) injection 0-6 Units  0-6 Units Subcutaneous TID WC Emeterio Reeve, DO   1 Units at 04/30/22 1653   lactated ringers infusion   Intravenous Continuous Emeterio Reeve, DO 75 mL/hr at 05/02/22 0301 Infusion Verify at 05/02/22 0301   magnesium hydroxide (MILK OF MAGNESIA) suspension 30 mL  30 mL Oral Daily PRN Mansy, Jan A, MD   30 mL at 04/29/22 0513   meclizine (ANTIVERT) tablet 12.5 mg  12.5 mg Oral TID PRN Mansy, Jan A, MD   12.5 mg at 04/30/22 0853   mycophenolate (CELLCEPT) capsule 500 mg  500 mg Oral BID Mansy, Jan A, MD   500 mg at 05/02/22 0927   ondansetron (ZOFRAN) tablet 4 mg  4 mg Oral Q6H PRN Mansy, Jan A, MD       Or   ondansetron [facility]) injection 4 mg  4 mg Intravenous Q6H PRN Mansy, Jan A, MD   4 mg at 05/02/22 0023   pantoprazole (PROTONIX) EC tablet 40 mg  40 mg Oral BID Dorothe Pea, RPH   40 mg at 05/02/22 2197   predniSONE (DELTASONE) tablet 5 mg  5 mg Oral Q breakfast Emeterio Reeve, DO   5 mg at 05/02/22 5883   pregabalin (LYRICA) capsule 50 mg  50 mg Oral BID Mansy, Jan A, MD   50 mg at 05/02/22 2549   tacrolimus (PROGRAF) capsule 3 mg  3 mg Oral BID Mansy, Jan A, MD   3 mg at 05/02/22 8264   traZODone (DESYREL) tablet 25 mg  25 mg Oral QHS PRN Mansy, Jan A, MD   25 mg at 04/26/22 2151     Discharge Medications: Please see discharge summary for a list of discharge medications.  Relevant Imaging Results:  Relevant Lab Results:   Additional Information SSN [id number]  Beverly Sessions, RN

## 2022-05-02 NOTE — Discharge Summary (Signed)
Physician Discharge Summary   Patient: Jean Johnson MRN: [id number] DOB: 1950/12/16  Admit date:     04/25/2022  Discharge date: 05/02/22  Discharge Physician: Annita Brod   PCP: Gladstone Lighter, MD   Recommendations at discharge:   New medication: Tylenol 650 p.o. every 6 hours as needed for mild pain or fever New medication: Albuterol 2 puffs every 6 hours as needed for wheezing Medication change: Eliquis decreased to 2.5 mg p.o. twice daily New medication: Mucinex 1200 mg p.o. twice daily as needed for cough New medication: Trazodone 25 mg p.o. nightly as needed for sleep New medication: Protonix 40 mg p.o. twice daily Medication clarification: Patient on Lyrica 50 mg p.o. twice daily Medication: Oxy IR 5 mg every 6 hours as needed for pain until 10/27 Medication change: Patient on Humalog insulin sliding scale in the morning and nightly which was discontinued  Discharge Diagnoses: Principal Problem:   Intractable nausea and vomiting Active Problems:   Atrial fibrillation with rapid ventricular response (HCC)   Hypoglycemia   Type 2 diabetes mellitus with peripheral neuropathy ([place])   Renal transplant recipient   Dyslipidemia   Pressure injury of skin   AKI (acute kidney injury) ([place]) on CKD 3b/4   Pleural effusion due to CHF (congestive heart failure) ([place])   Acute heart failure with preserved ejection fraction (HFpEF) ([place])  Resolved Problems:   * No resolved hospital problems. *  Hospital Course: [name] is a 71 y.o. 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  Patient had just been  admitted and discharged 2 days prior for COVID-19 infection and acute on chronic diastolic heart failure.  At that time, skilled nursing was recommended which she refused.   Patient brought in for further evaluation.  By following morning, having episodes of hypoglycemia in her morning/evening sliding scale discontinued.  Seen by physical therapy who recommended skilled nursing.  Patient found to have AKI and treated with IV fluids.  Nephrology consulted.  By 10/20, having some increased shortness of breath and chest x-ray noted increased pulmonary edema with effusion.  Interventional radiology consulted and patient underwent thoracentesis.  Echocardiogram noted indeterminate diastolic function with left ventricular hypertrophy found to have significant hyperglycemia and discharged two days ago during which she was managed for COVID-19, acute on chronic diastolic CHF and AKI, on that admission she refused SNF placement on discharge..  She continued to feel weak after discharge and has been having N/V.  By 10/23, creatinine close to baseline.  Consultants:  Nephrology  Procedures: 04/29/22 thoracentesis yielded 350 mL watery straw colored fluid, labs transudate  Echocardiogram noting indeterminate diastolic function      ASSESSMENT & PLAN:   Principal Problem:   Intractable nausea and vomiting Active Problems:   Atrial fibrillation with rapid ventricular response (HCC)   Hypoglycemia   Type 2 diabetes mellitus with peripheral neuropathy ([name])   Renal transplant recipient   Dyslipidemia   Pressure injury of skin   AKI (acute kidney injury) ([name]) on CKD 3b/4   Pleural effusion due to CHF (congestive heart failure) (HCC)   Acute heart failure with preserved ejection fraction (HFpEF) (HCC)   Generalized weakness due to deconditioning Intractable  nausea and vomiting -improved IV fluids to transition to p.o. as able / pending AKI and nephro recs  as needed antiemetics  IV PPI therapy with  Protonix. PT/OT --> PT recs SNF  Acute Kidney Injury on CKD 3b/4 likely d/t dehydration - improving  Consult nephrology 10/19 --> improved with fluids, been treated 1 dose of Lasix then more fluids and creatinine at 1.79 with GFR of 30 by day of discharge  Clinical CHF w/o history - HFpEF Heart size mildly enlarged, acute pulmonary edema, pulmonary effusion likely due to IV fluid resuscitation to treat issues as above BNP --> slight elevation, CKD may be contributing Echo --> LVEF 60 to 65%, normal LV function, no RWMA,(+) LVH, diastolic function could not be evaluated, RV systolic function moderately reduced, RV mildly enlarged.  LA/RA severely dilated.  Small pericardial effusion.  Likely with diastolic heart failure.  Transudative Pleural effusion Most likely due to HFpEF Status post thoracentesis with removal of 350 mL On follow-up CXR, question skinfold versus mild pneumothorax on imaging, monitor respiratory status  Atrial fibrillation - stable continue flecainide, Cardizem CD. continue Eliquis. as needed IV Cardizem for rapid rate.  Type 2 diabetes mellitus with peripheral neuropathy (HCC) Hypoglycemia - resolved  Renal transplant recipient continue Prograf and CellCept as well as prednisone.  Dyslipidemia continue statin therapy and Zetia.          Diet recommendation:  Carb modified  DISCHARGE MEDICATION: Allergies as of 05/02/2022       Reactions   E-mycin [erythromycin] Swelling   And itching   Penicillins Swelling   Throat        Medication List     STOP taking these medications    insulin lispro 100 UNIT/ML KwikPen Commonly known as: HUMALOG       TAKE these medications    acetaminophen 325 MG tablet Commonly known as: TYLENOL Take 2 tablets (650 mg total) by mouth every 6 (six) hours as needed for mild pain (or Fever >/= 101).   albuterol 108 (90 Base) MCG/ACT inhaler Commonly known as: VENTOLIN HFA Inhale 2 puffs into the lungs  every 6 (six) hours as needed for wheezing or shortness of breath.   alendronate 70 MG tablet Commonly known as: FOSAMAX Take 70 mg by mouth once a week.   apixaban 2.5 MG Tabs tablet Commonly known as: ELIQUIS Take 1 tablet (2.5 mg total) by mouth 2 (two) times daily. What changed:  medication strength how much to take   atorvastatin 10 MG tablet Commonly known as: LIPITOR Take 10 mg by mouth daily.   cyanocobalamin 2000 MCG tablet Take 2,000 mcg by mouth daily.   diltiazem 240 MG 24 hr capsule Commonly known as: CARDIZEM CD Take 240 mg by mouth at bedtime.   DRY EYES OP Place 1 drop into both eyes daily as needed (blurry Vision or floater in eyes).   ezetimibe 10 MG tablet Commonly known as: ZETIA Take 10 mg by mouth daily.   flecainide 100 MG tablet Commonly known as: TAMBOCOR Take 100 mg by mouth every 12 (twelve) hours.   guaiFENesin 600 MG 12 hr tablet Commonly known as: MUCINEX Take 2 tablets (1,200 mg total) by mouth 2 (two) times daily as needed for cough or to loosen phlegm. What changed:  when to take this reasons to take this   insulin detemir 100 UNIT/ML injection Commonly known as: LEVEMIR Inject 8 Units into the skin at bedtime.   Ipratropium-Albuterol 20-100 MCG/ACT Aers respimat Commonly known as:  COMBIVENT Inhale 1 puff into the lungs every 6 (six) hours for 10 days.   meclizine 12.5 MG tablet Commonly known as: ANTIVERT Take 12.5 mg by mouth 3 (three) times daily as needed for dizziness.   mycophenolate 500 MG tablet Commonly known as: CELLCEPT Take 500 mg by mouth 2 (two) times daily.   oxyCODONE 5 MG immediate release tablet Commonly known as: Roxicodone Take 1 tablet (5 mg total) by mouth every 6 (six) hours as needed for up to 5 days for severe pain or breakthrough pain.   pantoprazole 40 MG tablet Commonly known as: PROTONIX Take 1 tablet (40 mg total) by mouth 2 (two) times daily.   predniSONE 5 MG tablet Commonly known as:  DELTASONE Take 5 mg by mouth daily with breakfast.   pregabalin 50 MG capsule Commonly known as: LYRICA Take 1 capsule (50 mg total) by mouth 2 (two) times daily. What changed: Another medication with the same name was removed. Continue taking this medication, and follow the directions you see here.   tacrolimus 1 MG capsule Commonly known as: PROGRAF Take 3 mg by mouth 2 (two) times daily.   traZODone 50 MG tablet Commonly known as: DESYREL Take 0.5 tablets (25 mg total) by mouth at bedtime as needed for sleep.        Contact information for after-discharge care     Destination     [facility] Preferred SNF .   Service: Skilled Nursing Contact information: [address] [place] [phone]                    Discharge Exam: Danley Danker Weights   04/25/22 1658  Weight: 50.8 kg   General: Alert and oriented x2, no acute distress Cardiovascular: Regular rate and rhythm, S1-S2 Lungs: Decreased breath sounds bibasilar  Condition at discharge: good  The results of significant diagnostics from this hospitalization (including imaging, microbiology, ancillary and laboratory) are listed below for reference.   Imaging Studies: US THORACENTESIS ASP PLEURAL SPACE W/IMG GUIDE  Result Date: 04/29/2022 INDICATION: Left pleural effusion EXAM: ULTRASOUND GUIDED LEFT THORACENTESIS MEDICATIONS: 9 cc 1% lidocaine COMPLICATIONS: None immediate. PROCEDURE: An ultrasound guided thoracentesis was thoroughly discussed with the patient and questions answered. The benefits, risks, alternatives and complications were also discussed. The patient understands and wishes to proceed with the procedure. Written consent was obtained. Ultrasound was performed to localize and mark an adequate pocket of fluid in the left chest. The area was then prepped and draped in the normal sterile fashion. 1% Lidocaine was used for local anesthesia. Under ultrasound  guidance a 6 Fr Safe-T-Centesis catheter was introduced. Thoracentesis was performed. The catheter was removed and a dressing applied. FINDINGS: A total of approximately 350 mL of watery yellow fluid was removed. Samples were sent to the laboratory as requested by the clinical team. IMPRESSION: Successful ultrasound guided left thoracentesis yielding 350 mL of pleural fluid. Follow-up chest x-ray revealed no evidence of left-sided pneumothorax.; a suspected skin fold was over the apex of the right lung, however the reading radiologist recommended attention on follow-up to exclude a pneumothorax. Read by: Reatha Armour, PA-C Electronically Signed   By: Jacqulynn Cadet M.D.   On: 04/29/2022 16:51   ECHOCARDIOGRAM COMPLETE  Result Date: 04/29/2022    ECHOCARDIOGRAM REPORT   Patient Name:   KATANYA SCHLIE DAVIS Date of Exam: 04/29/2022 Medical Rec #:  [id number]           Height:  60.0 in Accession #:    [id number]          Weight:       112.0 lb Date of Birth:  08/26/1950           BSA:          1.459 m Patient Age:    60 years            BP:           133/63 mmHg Patient Gender: F                   HR:           56 bpm. Exam Location:  [facility] Procedure: 2D Echo, Cardiac Doppler and Color Doppler Indications:     CHF-acute systolic O24.23  History:         Patient has no prior history of Echocardiogram examinations.                  Arrythmias:Atrial Fibrillation, Signs/Symptoms:Murmur; Risk                  Factors:Hypertension.  Sonographer:     Sherrie Sport Referring Phys:  [id number] Emeterio Reeve Diagnosing Phys: Neoma Laming  Sonographer Comments: Technically challenging study due to limited acoustic windows and no apical window. IMPRESSIONS  1. Left ventricular ejection fraction, by estimation, is 60 to 65%. The left ventricle has normal function. The left ventricle has no regional wall motion abnormalities. There is severe concentric left ventricular hypertrophy. Left ventricular diastolic  function  could not be evaluated.  2. Right ventricular systolic function is moderately reduced. The right ventricular size is mildly enlarged. Severely increased right ventricular wall thickness.  3. Left atrial size was severely dilated.  4. Right atrial size was severely dilated.  5. A small pericardial effusion is present. The pericardial effusion is circumferential.  6. The mitral valve is grossly normal. Mild mitral valve regurgitation.  7. The aortic valve is calcified. Aortic valve regurgitation is not visualized. Aortic valve sclerosis is present, with no evidence of aortic valve stenosis. FINDINGS  Left Ventricle: Left ventricular ejection fraction, by estimation, is 60 to 65%. The left ventricle has normal function. The left ventricle has no regional wall motion abnormalities. The left ventricular internal cavity size was small. There is severe concentric left ventricular hypertrophy. Left ventricular diastolic function could not be evaluated. Right Ventricle: The right ventricular size is mildly enlarged. Severely increased right ventricular wall thickness. Right ventricular systolic function is moderately reduced. Left Atrium: Left atrial size was severely dilated. Right Atrium: Right atrial size was severely dilated. Pericardium: A small pericardial effusion is present. The pericardial effusion is circumferential. Mitral Valve: The mitral valve is grossly normal. Mild mitral valve regurgitation. Tricuspid Valve: The tricuspid valve is grossly normal. Tricuspid valve regurgitation is mild. Aortic Valve: The aortic valve is calcified. Aortic valve regurgitation is not visualized. Aortic valve sclerosis is present, with no evidence of aortic valve stenosis. Pulmonic Valve: The pulmonic valve was grossly normal. Pulmonic valve regurgitation is trivial. Aorta: The aortic root, ascending aorta and aortic arch are all structurally normal, with no evidence of dilitation or obstruction. IAS/Shunts: No atrial level shunt  detected by color flow Doppler.  LEFT VENTRICLE PLAX 2D LVIDd:         4.10 cm LVIDs:         2.80 cm LV PW:         1.20 cm LV IVS:  1.00 cm LVOT diam:     1.90 cm LVOT Area:     2.84 cm   AORTA Ao Root diam: 2.50 cm  SHUNTS Systemic Diam: 1.90 cm Neoma Laming Electronically signed by Neoma Laming Signature Date/Time: 04/29/2022/3:39:13 PM    Final    DG Chest Port 1 View  Result Date: 04/29/2022 CLINICAL DATA:  Status post left thoracentesis. EXAM: PORTABLE CHEST 1 VIEW COMPARISON:  Chest radiograph 04/29/2022 at 6:54 a.m. FINDINGS: The cardiac silhouette remains enlarged with similar appearance of pulmonary vascular congestion and interstitial and patchy airspace opacities in both lungs. There is improved aeration of the left lung base status post thoracentesis, likely with at most a small pleural effusion remaining. A trace right pleural effusion persists. No left-sided pneumothorax is identified. Curvilinear density projecting over the right lung apex appears to extend beyond the lung margins and is favored to reflect a skin fold, with suggestion of lung markings also extending beyond this line in the apex. Prior cervical spine fusion is noted. IMPRESSION: 1. Improved aeration of the left lung base status post thoracentesis. No left-sided pneumothorax. 2. Suspected skin fold overlying the right lung apex. Attention on follow-up to exclude a pneumothorax. 3. Unchanged pulmonary edema. Electronically Signed   By: Logan Bores M.D.   On: 04/29/2022 11:55   DG Chest Port 1 View  Result Date: 04/29/2022 CLINICAL DATA:  71 year old female with history of crackles in the lungs and dry cough. EXAM: PORTABLE CHEST 1 VIEW COMPARISON:  Chest x-ray 04/25/2022. FINDINGS: Compared to the prior examination there is now large cephalization of the pulmonary vasculature, indistinct interstitial markings and patchy ill-defined opacities throughout the lungs bilaterally, suggestive of worsening pulmonary edema.  Opacity at the left base which may reflect atelectasis and/or consolidation, with superimposed moderate to large left pleural effusion. Probable trace right pleural effusion. No pneumothorax. Heart size is mildly enlarged. Upper mediastinal contours are within normal limits. Atherosclerotic calcifications in the thoracic aorta. Orthopedic fixation hardware in the lower cervical spine incompletely imaged. IMPRESSION: 1. The appearance of the chest suggests worsening congestive heart failure, as above. 2. Increasing atelectasis and/or consolidation in the base of the left lung with increasing moderate to large left pleural effusion. Probable trace right pleural effusion. 3. Aortic atherosclerosis. Electronically Signed   By: Vinnie Langton M.D.   On: 04/29/2022 07:06   DG Chest 2 View  Result Date: 04/25/2022 CLINICAL DATA:  Weakness EXAM: CHEST - 2 VIEW COMPARISON:  04/17/2022, 04/14/2022 FINDINGS: Small bilateral pleural effusions. Cardiomegaly with vascular congestion. Overall improved aeration of left chest compared with 04/17/2022. No pneumothorax. Aortic atherosclerosis. IMPRESSION: Cardiomegaly with small bilateral effusions and central congestion. Overall improved aeration of left thorax compared with 04/17/2022 Electronically Signed   By: Donavan Foil M.D.   On: 04/25/2022 17:52   DG Chest Port 1 View  Result Date: 04/17/2022 CLINICAL DATA:  Pneumonia. EXAM: PORTABLE CHEST 1 VIEW COMPARISON:  04/14/2022 FINDINGS: Increasing opacity and volume loss within the mid and lower LEFT lung noted with possible superimposed LEFT pleural effusion. Mild RIGHT lung interstitial opacities again noted. There is no evidence of pneumothorax. No acute bony abnormalities are identified. IMPRESSION: Increasing opacity and volume loss within the mid and lower LEFT lung with possible superimposed LEFT pleural effusion. This likely represents a combination of increasing airspace disease and atelectasis. Electronically  Signed   By: Margarette Canada M.D.   On: 04/17/2022 10:31   US RENAL  Result Date: 04/14/2022 CLINICAL DATA:  Acute kidney injury,  history of renal transplant. EXAM: RENAL / URINARY TRACT ULTRASOUND COMPLETE COMPARISON:  January 08, 2021. FINDINGS: Right Kidney: Renal measurements: 7.5 x 3.5 x 3.4 cm = volume: 46 mL. Increased echogenicity of renal parenchyma is noted suggesting medical renal disease. 1.9 cm simple cyst is noted in upper pole. No mass or hydronephrosis visualized. Left lower quadrant renal transplant: Renal measurements: 10.0 x 6.0 x 4.4 cm = volume: 139 mL. Mildly increased echogenicity of renal parenchyma is noted. No mass or hydronephrosis visualized. Bladder: Appears normal for degree of bladder distention. Other: None. IMPRESSION: Renal transplant is noted in left lower quadrant. No hydronephrosis or renal obstruction is noted. Increased echogenicity of of the renal parenchyma of the left renal transplant is noted suggesting medical renal disease. Atrophy of right kidney is noted consistent with end-stage renal disease. Electronically Signed   By: Marijo Conception M.D.   On: 04/14/2022 09:10   DG Chest Port 1 View  Result Date: 04/14/2022 CLINICAL DATA:  71 year old female with history of dizziness. Recent history of COVID infection. EXAM: PORTABLE CHEST 1 VIEW COMPARISON:  No priors. FINDINGS: Lung volumes are normal. Opacity at the left base which may reflect atelectasis and/or consolidation, with superimposed small left pleural effusion. Right lung is clear. No right pleural effusion. Skin fold artifact projecting over the right hemithorax incidentally noted. No pneumothorax. Cephalization of the pulmonary vasculature with indistinctness of the interstitial markings. Heart size is mildly enlarged. Upper mediastinal contours are within normal limits. Atherosclerotic calcifications in the thoracic aorta. Orthopedic fixation hardware in the lower cervical spine incidentally noted. IMPRESSION: 1.  The appearance of the chest suggests mild congestive heart failure, as above. 2. There is also a more focal area of volume loss and potential airspace consolidation in the left lower lobe which could reflect developing infection. 3. Small left pleural effusion. 4. Aortic atherosclerosis. Electronically Signed   By: Vinnie Langton M.D.   On: 04/14/2022 05:52   CT Head Wo Contrast  Result Date: 04/14/2022 CLINICAL DATA:  71 year old female with history of altered mental status. EXAM: CT HEAD WITHOUT CONTRAST TECHNIQUE: Contiguous axial images were obtained from the base of the skull through the vertex without intravenous contrast. RADIATION DOSE REDUCTION: This exam was performed according to the departmental dose-optimization program which includes automated exposure control, adjustment of the mA and/or kV according to patient size and/or use of iterative reconstruction technique. COMPARISON:  No priors. FINDINGS: Brain: No evidence of acute infarction, hemorrhage, hydrocephalus, extra-axial collection or mass lesion/mass effect. Vascular: No hyperdense vessel or unexpected calcification. Skull: Normal. Negative for fracture or focal lesion. Sinuses/Orbits: No acute finding. Other: None. IMPRESSION: 1. No acute intracranial abnormalities. The appearance of the brain is normal for age. Electronically Signed   By: Vinnie Langton M.D.   On: 04/14/2022 05:16    Microbiology: Results for orders placed or performed during the hospital encounter of 04/25/22  Body fluid culture w Gram Stain     Status: None (Preliminary result)   Collection Time: 04/29/22 11:25 AM   Specimen: PATH Cytology Pleural fluid  Result Value Ref Range Status   Specimen Description   Final    PLEURAL Performed at [facility], [address], [place], [place] [address]    Special Requests   Final    NONE Performed at [facility], [address] [place], [place] [address]    Gram Stain   Final    RARE WBC  PRESENT, PREDOMINANTLY MONONUCLEAR NO ORGANISMS SEEN  Culture   Final    NO GROWTH 3 DAYS Performed at [facility], [place] [address] [place], [place] [address]    Report Status PENDING  Incomplete    Labs: CBC: Recent Labs  Lab 04/26/22 0823 04/27/22 0528 04/29/22 0458 05/02/22 0446  WBC 7.6 7.5 9.7 9.0  HGB 8.6* 9.2* 10.0* 10.2*  HCT 28.0* 29.3* 32.3* 32.0*  MCV 98.6 95.4 97.9 95.5  PLT 140* 169 201 901   Basic Metabolic Panel: Recent Labs  Lab 04/28/22 0432 04/29/22 0458 04/30/22 0526 05/01/22 0500 05/02/22 0446  NA 140 140 142 142 141  K 4.5 4.0 4.9 4.9 4.4  CL 108 110 110 112* 112*  CO2 24 23 26 25 24   GLUCOSE 193* 118* 103* 166* 76  BUN 40* 34* 33* 32* 28*  CREATININE 2.55* 2.46* 2.12* 2.04* 1.79*  CALCIUM 8.4* 8.3* 8.8* 8.7* 8.7*   Liver Function Tests: No results for input(s): "AST", "ALT", "ALKPHOS", "BILITOT", "PROT", "ALBUMIN" in the last 168 hours. CBG: Recent Labs  Lab 05/01/22 1158 05/01/22 1533 05/01/22 2306 05/02/22 0802 05/02/22 1202  GLUCAP 98 115* 147* 94 145*    Discharge time spent: less than 30 minutes.  Signed: Annita Brod, MD [facility] 05/02/2022

## 2022-05-02 NOTE — TOC Transition Note (Signed)
Transition of Care [facility]) - CM/SW Discharge Note   Patient Details  Name: Jean Johnson MRN: [id number] Date of Birth: 02/22/1951  [facility] CM/SW Contact:  Beverly Sessions, RN Phone Number: 05/02/2022, 2:14 PM   Clinical Narrative:     Patient will DC to: [facility] 805 Anticipated DC date:dermatitis10/23/23  Family notified:husband Transport QI:[id number]  Per MD patient ready for DC to . RN, patient, patient's family, and facility notified of DC. Discharge Summary sent to facility. RN given number for report. DC packet on chart. Ambulance transport requested for patient.  Full code.  TOC signing off.  Isaias Cowman [facility] [phone]   Final next level of care: Skilled Nursing Facility Barriers to Discharge: Barriers Resolved   Patient Goals and CMS Choice Patient states their goals for this hospitalization and ongoing recovery are:: to fele better CMS Medicare.gov Compare Post Acute Care list provided to:: Patient    Discharge Placement                       Discharge Plan and Services                                     Social Determinants of Health (SDOH) Interventions     Readmission Risk Interventions    04/27/2022    3:34 PM  Readmission Risk Prevention Plan  Transportation Screening Complete  HRI or [place] Complete  Social Work Consult for [place] Planning/Counseling Complete  Palliative Care Screening Not Applicable  Medication Review [employer] photographer) Complete

## 2022-05-02 NOTE — Progress Notes (Signed)
[facility]  PROGRESS NOTE   Subjective:   Patient seen sitting up in bed, partially completed lunch tray at bedside.  Patient states appetite remains low.  Continues to complain of weakness.  Room air No lower extremity edema  Objective:  Vital signs: Blood pressure 136/82, pulse 84, temperature 98.9 F (37.2 C), temperature source Oral, resp. rate 18, height 5' (1.524 m), weight 50.8 kg, SpO2 98 %.  Intake/Output Summary (Last 24 hours) at 05/02/2022 1450 Last data filed at 05/02/2022 1343 Gross per 24 hour  Intake 1926.37 ml  Output 1400 ml  Net 526.37 ml    Filed Weights   04/25/22 1658  Weight: 50.8 kg     Physical Exam: General:  No acute distress  Head:  Normocephalic, atraumatic. Moist oral mucosal membranes  Eyes:  Anicteric  Lungs:   Clear to auscultation, normal effort  Heart:  S1S2 no rubs  Abdomen:   Soft, nontender, bowel sounds present  Extremities: No peripheral edema.  Neurologic:  Awake, alert, following commands  Skin:  No lesions  Access: None    Basic Metabolic Panel: Recent Labs  Lab 04/28/22 0432 04/29/22 0458 04/30/22 0526 05/01/22 0500 05/02/22 0446  NA 140 140 142 142 141  K 4.5 4.0 4.9 4.9 4.4  CL 108 110 110 112* 112*  CO2 24 23 26 25 24   GLUCOSE 193* 118* 103* 166* 76  BUN 40* 34* 33* 32* 28*  CREATININE 2.55* 2.46* 2.12* 2.04* 1.79*  CALCIUM 8.4* 8.3* 8.8* 8.7* 8.7*     CBC: Recent Labs  Lab 04/26/22 0823 04/27/22 0528 04/29/22 0458 05/02/22 0446  WBC 7.6 7.5 9.7 9.0  HGB 8.6* 9.2* 10.0* 10.2*  HCT 28.0* 29.3* 32.3* 32.0*  MCV 98.6 95.4 97.9 95.5  PLT 140* 169 201 234      Urinalysis: No results for input(s): "COLORURINE", "LABSPEC", "PHURINE", "GLUCOSEU", "HGBUR", "BILIRUBINUR", "KETONESUR", "PROTEINUR", "UROBILINOGEN", "NITRITE", "LEUKOCYTESUR" in the last 72 hours.  Invalid input(s): "APPERANCEUR"    Imaging: No results found.   Medications:    lactated ringers 75 mL/hr at 05/02/22  0301    apixaban  2.5 mg Oral BID   atorvastatin  10 mg Oral Daily   cyanocobalamin  2,000 mcg Oral Daily   dextrose  1 ampule Intravenous Once   diltiazem  240 mg Oral QHS   ezetimibe  10 mg Oral Daily   flecainide  100 mg Oral Q12H   insulin aspart  0-6 Units Subcutaneous TID WC   mycophenolate  500 mg Oral BID   pantoprazole  40 mg Oral BID   predniSONE  5 mg Oral Q breakfast   pregabalin  50 mg Oral BID   tacrolimus  3 mg Oral BID    Assessment/ Plan:     Principal Problem:   Intractable nausea and vomiting Active Problems:   Hypoglycemia   Atrial fibrillation with rapid ventricular response (HCC)   Type 2 diabetes mellitus with peripheral neuropathy (HCC)   Renal transplant recipient   Dyslipidemia   Pressure injury of skin   AKI (acute kidney injury) ([place]) on CKD 3b/4   Pleural effusion due to CHF (congestive heart failure) ([place])   Acute heart failure with preserved ejection fraction (HFpEF) ([place])  71 year old female with history of end-stage renal disease status posttransplant day 2000 subsequent rejection in 2006.  Later she had second renal transplant in 2010.  She has a baseline creatinine of about 1.7.  She is now admitted with history of generalized weakness after  COVID infection recently.   #1: Acute kidney injury with chronic kidney disease stage IV: Baseline creatinine appears to be 1.87 with GFR 28 on 04/16/2022.  Possibly secondary to prerenal azotemia complicated by sepsis.  We will continue to monitor closely.  Renal function has returned to baseline with IV fluids and improved oral nutrition.  Patient encouraged to maintain and improve oral intake.  We will schedule patient follow-up in our office at discharge.   #2: History of kidney transplant: We will continue the immunosuppressive's with tacrolimus, prednisone and mycophenolate.  We may need to check tacrolimus levels.   #3: Anemia with chronic kidney disease: Patient has anemia secondary to chronic kidney  disease.  Hemoglobin remains within desired target.   #4: Pleural effusion: Patient is s/p thoracentesis.   #5: Congestive heart failure: Patient is advised on the importance of fluid restriction.  We will continue the furosemide at the present doses.    LOS: [facility] 10/23/20232:50 PM

## 2022-05-03 LAB — BODY FLUID CULTURE W GRAM STAIN: Culture: NO GROWTH

## 2022-05-23 ENCOUNTER — Emergency Department: Payer: Medicare Other

## 2022-05-23 ENCOUNTER — Other Ambulatory Visit: Payer: Self-pay

## 2022-05-23 ENCOUNTER — Emergency Department
Admission: EM | Admit: 2022-05-23 | Discharge: 2022-05-23 | Disposition: A | Discharge status: Home / Self Care | Payer: Medicare Other | Attending: Emergency Medicine | Admitting: Emergency Medicine

## 2022-05-23 DIAGNOSIS — E039 Hypothyroidism, unspecified: Secondary | ICD-10-CM | POA: Diagnosis not present

## 2022-05-23 DIAGNOSIS — K5900 Constipation, unspecified: Secondary | ICD-10-CM | POA: Insufficient documentation

## 2022-05-23 DIAGNOSIS — R1084 Generalized abdominal pain: Secondary | ICD-10-CM | POA: Diagnosis present

## 2022-05-23 DIAGNOSIS — E119 Type 2 diabetes mellitus without complications: Secondary | ICD-10-CM | POA: Insufficient documentation

## 2022-05-23 MED ORDER — POLYETHYLENE GLYCOL 3350 17 GM/SCOOP PO POWD: 1.0000 | Freq: Once | ORAL | 0 refills | Status: AC

## 2022-05-23 NOTE — Discharge Instructions (Addendum)
Please use MiraLAX one half capful every hour until your first bowel movement.  Please do not take any MiraLAX after this for at least 24 hours.  You may use one half capful twice a day of MiraLAX in order to have 1 solid well-formed bowel movement per day.  You may increase or decrease this dosage as needed to obtain this 1 well-formed bowel movement.  Please make sure that you are drinking at least 8 ounces of water every hour during this initial bowel regimen. ?

## 2022-05-23 NOTE — ED Triage Notes (Signed)
Pt sts that she was at her PCP office at [facility]. Pt sts that she is possibly impacted as she has not had a BM for a week or longer.

## 2022-05-23 NOTE — ED Triage Notes (Signed)
First Nurse Note:  Arrives from [facility] for ED evaluation for fecal impaction.Marland Kitchen

## 2022-05-23 NOTE — ED Notes (Signed)
Pt difficult stick. RN was able to collect some blood for cbc. Might need recollect.

## 2022-05-23 NOTE — ED Provider Notes (Addendum)
[facility] Provider Note   Event Date/Time   First MD Initiated Contact with Patient 05/23/22 1600     (approximate) History  Constipation  HPI Jean Johnson is a 71 y.o. female with a stated past medical history of chronic atrial fibrillation, type 2 diabetes, hyperlipidemia, hypokalemia, status post renal transplant, and hypothyroidism who presents for a primary care physician's office after a week of no bowel movements concern for constipation and possible fecal impaction.  Patient states that she has never had this issue with constipation in the past.  Patient has not tried any medications for the lack of bowel movements.  Patient does endorse generalized aching 9/10 intermittent abdominal pain that does not radiate and is worsened each day without a bowel movement. ROS: Patient currently denies any vision changes, tinnitus, difficulty speaking, facial droop, sore throat, chest pain, shortness of breath, nausea/vomiting/diarrhea, dysuria, or weakness/numbness/paresthesias in any extremity   Physical Exam  Triage Vital Signs: ED Triage Vitals [05/23/22 1503]  Enc Vitals Group     BP (!) 147/77     Pulse Rate 94     Resp 17     Temp 98.5 F (36.9 C)     Temp Source Oral     SpO2 100 %     Weight 111 lb (50.3 kg)     Height      Head Circumference      Peak Flow      Pain Score 9     Pain Loc      Pain Edu?      Excl. in [place]?    Most recent vital signs: Vitals:   05/23/22 1503  BP: (!) 147/77  Pulse: 94  Resp: 17  Temp: 98.5 F (36.9 C)  SpO2: 100%   General: Awake, oriented x4. CV:  Good peripheral perfusion.  Resp:  Normal effort.  Abd:  No distention.  Rectal:  Decreased rectal tone without stool ball appreciated in rectal vault and no bleeding or external hemorrhoids. Other:  Elderly African-American female laying in bed in no acute distress. ED Results / Procedures / Treatments  Labs (all labs ordered are listed, but only  abnormal results are displayed) Labs Reviewed  LIPASE, [place] ED MD interpretation: Single view abdominal x-ray interpreted by me and shows large colonic stool burden throughout the colon and in the rectum however no stool ball in the rectum is appreciated -Agree with radiology assessment Official radiology report(s): DG Abdomen 1 View  Result Date: 05/23/2022 CLINICAL DATA:  Constipation.  Abdominal pain. EXAM: ABDOMEN - 1 VIEW COMPARISON:  None Available. FINDINGS: There is a large amount of stool throughout the colon and in the rectum. No small bowel dilatation is seen to indicate obstruction. Surgical clips are noted in the left greater than right pelvis. Mild scoliosis and moderate to severe disc degeneration are noted in the lumbar spine. IMPRESSION: Large colonic stool burden.  No evidence of bowel obstruction. Electronically Signed   By: Logan Bores M.D.   On: 05/23/2022 15:26   PROCEDURES: Critical Care performed: No .1-3 Lead EKG Interpretation  Performed by: Naaman Plummer, MD Authorized by: Naaman Plummer, MD     Interpretation: normal     ECG rate:  93   ECG rate assessment: normal     Rhythm: sinus rhythm     Ectopy: none     Conduction: normal   Fecal disimpaction  Date/Time: 05/23/2022  5:11 PM  Performed by: Naaman Plummer, MD Authorized by: Naaman Plummer, MD  Consent: The procedure was performed in an emergent situation. Verbal consent not obtained. Risks and benefits: risks, benefits and alternatives were discussed Consent given by: patient Imaging studies: imaging studies available Patient identity confirmed: verbally with patient Time out: Immediately prior to procedure a "time out" was called to verify the correct patient, procedure, equipment, support staff and site/side marked as required. Preparation: Patient was prepped and draped in the usual sterile fashion. Local anesthesia used:  no  Anesthesia: Local anesthesia used: no  Sedation: Patient sedated: no  Patient tolerance: patient tolerated the procedure well with no immediate complications    MEDICATIONS ORDERED IN ED: Medications - No data to display IMPRESSION / MDM / [place] / ED COURSE  I reviewed the triage vital signs and the nursing notes.                             The patient is on the cardiac monitor to evaluate for evidence of arrhythmia and/or significant heart rate changes. Patient's presentation is most consistent with acute presentation with potential threat to life or bodily function. Patients history and exam most consistent with constipation as an etiology for their pain.  Patients symptoms not typical for other emergent causes of abdominal pain such as, but not limited to, appendicitis, abdominal aortic aneurysm, pancreatitis, SBO, mesenteric ischemia, serious intra-abdominal bacterial illness.  Patient without red flags concerning for cancer as a constipation etiology.  Rx: Miralax  Disposition:  Patient will be discharged with strict return precautions and follow up with primary MD within 24-48 hours for further evaluation. Patient understands that this still may have an early presentation of an emergent medical condition such as appendicitis that will require a recheck.   FINAL CLINICAL IMPRESSION(S) / ED DIAGNOSES   Final diagnoses:  Constipation, unspecified constipation type  Generalized abdominal pain   Rx / DC Orders   ED Discharge Orders          Ordered    polyethylene glycol powder (GLYCOLAX/MIRALAX) 17 GM/SCOOP powder   Once        05/23/22 1616           Note:  This document was prepared using Dragon voice recognition software and may include unintentional dictation errors.   Naaman Plummer, MD 05/23/22 3474    Naaman Plummer, MD 05/23/22 [phone]

## 2022-05-23 NOTE — ED Notes (Signed)
Provider at bedside. Large amount of hard stool removed from rectum by provider. Patient tolerated well. Patient cleansed, dried and clean brief placed.

## 2022-06-21 ENCOUNTER — Other Ambulatory Visit: Payer: Self-pay

## 2022-06-21 ENCOUNTER — Emergency Department
Admission: EM | Admit: 2022-06-21 | Discharge: 2022-06-22 | Disposition: A | Discharge status: Home / Self Care | Payer: Medicare Other | Attending: Emergency Medicine | Admitting: Emergency Medicine

## 2022-06-21 DIAGNOSIS — N186 End stage renal disease: Secondary | ICD-10-CM | POA: Insufficient documentation

## 2022-06-21 DIAGNOSIS — E1140 Type 2 diabetes mellitus with diabetic neuropathy, unspecified: Secondary | ICD-10-CM | POA: Diagnosis not present

## 2022-06-21 DIAGNOSIS — L89301 Pressure ulcer of unspecified buttock, stage 1: Secondary | ICD-10-CM | POA: Diagnosis not present

## 2022-06-21 DIAGNOSIS — I120 Hypertensive chronic kidney disease with stage 5 chronic kidney disease or end stage renal disease: Secondary | ICD-10-CM | POA: Insufficient documentation

## 2022-06-21 DIAGNOSIS — E1122 Type 2 diabetes mellitus with diabetic chronic kidney disease: Secondary | ICD-10-CM | POA: Insufficient documentation

## 2022-06-21 DIAGNOSIS — Z7901 Long term (current) use of anticoagulants: Secondary | ICD-10-CM | POA: Insufficient documentation

## 2022-06-21 LAB — CBC
HCT: 33.3 % — ABNORMAL LOW (ref 36.0–46.0)
Hemoglobin: 10 g/dL — ABNORMAL LOW (ref 12.0–15.0)
MCH: 30 pg (ref 26.0–34.0)
MCHC: 30 g/dL (ref 30.0–36.0)
MCV: 100 fL (ref 80.0–100.0)
Platelets: 219 10*3/uL (ref 150–400)
RBC: 3.33 MIL/uL — ABNORMAL LOW (ref 3.87–5.11)
RDW: 15.3 % (ref 11.5–15.5)
WBC: 7.5 10*3/uL (ref 4.0–10.5)
nRBC: 0 % (ref 0.0–0.2)

## 2022-06-21 LAB — COMPREHENSIVE METABOLIC PANEL
ALT: 5 U/L (ref 0–44)
AST: 15 U/L (ref 15–41)
Albumin: 2.8 g/dL — ABNORMAL LOW (ref 3.5–5.0)
Alkaline Phosphatase: 63 U/L (ref 38–126)
Anion gap: 12 (ref 5–15)
BUN: 37 mg/dL — ABNORMAL HIGH (ref 8–23)
CO2: 17 mmol/L — ABNORMAL LOW (ref 22–32)
Calcium: 8.9 mg/dL (ref 8.9–10.3)
Chloride: 110 mmol/L (ref 98–111)
Creatinine, Ser: 1.95 mg/dL — ABNORMAL HIGH (ref 0.44–1.00)
GFR, Estimated: 27 mL/min — ABNORMAL LOW (ref >60)
Glucose, Bld: 66 mg/dL — ABNORMAL LOW (ref 70–99)
Potassium: 3.8 mmol/L (ref 3.5–5.1)
Sodium: 139 mmol/L (ref 135–145)
Total Bilirubin: 1 mg/dL (ref 0.3–1.2)
Total Protein: 6 g/dL — ABNORMAL LOW (ref 6.5–8.1)

## 2022-06-21 NOTE — ED Triage Notes (Signed)
Pt told by PCP to get wounds on buttocks evaluated. States has had them for months but have been worsening.

## 2022-06-22 LAB — CBG MONITORING, ED
Glucose-Capillary: 60 mg/dL — ABNORMAL LOW (ref 70–99)
Glucose-Capillary: 65 mg/dL — ABNORMAL LOW (ref 70–99)
Glucose-Capillary: 66 mg/dL — ABNORMAL LOW (ref 70–99)
Glucose-Capillary: 89 mg/dL (ref 70–99)

## 2022-06-22 MED ORDER — DOXYCYCLINE HYCLATE 50 MG PO CAPS: 100.0000 mg | ORAL_CAPSULE | Freq: Two times a day (BID) | ORAL | 0 refills | Status: AC

## 2022-06-22 NOTE — ED Notes (Signed)
Discharge instrcutions provided by edp were discussed with pt and pt's caregiver/husband. Pt/caregiver were able to verbalize understanding with no additional questions at this time. Pt transferred onto wheelchair from home. Pt wheelchair from ed by caregiver.

## 2022-06-22 NOTE — Discharge Instructions (Signed)
Have your nurses change the dressings and assess the pressure ulcer on your buttocks each time the visit.  Speak with your primary doctor to the services as we discussed.    Thank you for choosing [place] for your health care today!  If you see any signs of infection like spreading redness, pus coming from the wound, extreme pain, fevers, chills or any other worsening doctor right away or come back to the emergency department  Please see your primary doctor this week for a follow up appointment.   Sometimes, in the early stages of certain disease courses it is difficult to detect in the emergency department evaluation -- so, it is important that you continue to monitor your symptoms and call your doctor right away or return to the emergency department if you develop any new or worsening symptoms.  Please go to the following website to schedule new (and existing) patient appointments:   [email or web address]  If you do not have a primary doctor try calling the following clinics to establish care:  If you have insurance:  [facility] [phone] [place] [address]   [facility]  [phone] [place] [place] [address]   If you do not have insurance:  [facility]  [phone] [address] [place] [address]   The following is another list of primary care offices in the area who are accepting new patients at this time.  Please reach out to one of them directly and let them know you would like to schedule an appointment to follow up on an Emergency Department visit, and/or to establish a new primary care provider (PCP).  There are likely other primary care clinics in the are who are accepting new patients, but this is an excellent place to start:  [place] physician: Dr Lavon Paganini [address] [place], [place] [address] [phone]  [facility] Lead Physician: Dr Steele Sizer [address], [place], [place] [address] [phone]  [place] Physician: Dr Park Liter [address] [place], [place] [address] [phone]  [facility] Lead Physician: Dr Dewaine Oats [place], [place], [place] [address] [phone]  [place] [facility] [place] Physician: Dr Halina Maidens [address] [place], [place] [address] [phone]   It was my pleasure to care for you today.   Hoover Brunette Jacelyn Grip, MD

## 2022-06-22 NOTE — ED Provider Notes (Signed)
[facility] Provider Note    Event Date/Time   First MD Initiated Contact with Patient 06/22/22 0028     (approximate)   History   Wound Check   HPI  Fama Muenchow is a 71 y.o. female   Past medical history of atrial fibrillation on Eliquis, diabetic neuropathy, end-stage renal disease, hyperlipidemia and hypertension, mostly bedbound, recent hospitalization and stent at a rehab facility and discharged home for the last several weeks who complains of a buttock ulceration that has gotten worse.  She has visiting nurses that do not address this wound.  Denies systemic symptoms like fevers or chills.  She has no other medical complaints acutely.    History was obtained by the patient.  Her husband is independent historian available for collateral information and corroborates information given above.     Physical Exam   Triage Vital Signs: ED Triage Vitals [06/21/22 1918]  Enc Vitals Group     BP (!) 148/82     Pulse Rate (!) 102     Resp 20     Temp 98.4 F (36.9 C)     Temp Source Oral     SpO2 100 %     Weight 110 lb (49.9 kg)     Height 5' (1.524 m)     Head Circumference      Peak Flow      Pain Score 10     Pain Loc      Pain Edu?      Excl. in [place]?     Most recent vital signs: Vitals:   06/21/22 1918 06/22/22 0030  BP: (!) 148/82 (!) 147/84  Pulse: (!) 102 (!) 104  Resp: 20 15  Temp: 98.4 F (36.9 C)   SpO2: 100% 100%    General: Awake, no distress.  CV:  Good peripheral perfusion.  Resp:  Normal effort.  Abd:  No distention.  Other:  Awake alert and oriented and pleasant.  Appears comfortable.  She does have a stage I pressure ulceration without cellulitic changes and no crepitus no pain out of proportion to her buttock.  Approximately 5 cm x 2 cm.   ED Results / Procedures / Treatments   Labs (all labs ordered are listed, but only abnormal results are displayed) Labs Reviewed  CBC - Abnormal; Notable for the  following components:      Result Value   RBC 3.33 (*)    Hemoglobin 10.0 (*)    HCT 33.3 (*)    All other components within normal limits  COMPREHENSIVE METABOLIC PANEL - Abnormal; Notable for the following components:   CO2 17 (*)    Glucose, Bld 66 (*)    BUN 37 (*)    Creatinine, Ser 1.95 (*)    Total Protein 6.0 (*)    Albumin 2.8 (*)    GFR, Estimated 27 (*)    All other components within normal limits  CBG MONITORING, ED - Abnormal; Notable for the following components:   Glucose-Capillary 60 (*)    All other components within normal limits  CBG MONITORING, ED - Abnormal; Notable for the following components:   Glucose-Capillary 65 (*)    All other components within normal limits     I reviewed labs and they are notable for baseline hemoglobin around 10, glucose 66.   PROCEDURES:  Critical Care performed: No  Procedures   MEDICATIONS ORDERED IN ED: Medications - No data to display  Consultants:  IMPRESSION /  MDM / ASSESSMENT AND PLAN / ED COURSE  I reviewed the triage vital signs and the nursing notes.                              Differential diagnosis includes, but is not limited to, pressure ulcer, cellulitis, abscess, sepsis or necrotizing fasciitis less likely   MDM: Patient with several months of worsening pressure ulceration on her buttocks, mostly bedbound, has visiting nurses that do not yet address this pressure ulcer.  No obvious cellulitic or infectious changes.  Will provide dressings and some supplies to change at home to give to the visiting nurses.  She will follow-up with her primary doctor to have this problem addressed by her visiting nurses and have wound rechecked.  Will prescribe doxycycline in case this was the beginning of some cellulitis.  She otherwise appears well and I do not think she has sepsis or necrotizing fasciitis.  Her blood glucose is 66 and she is eating with normal mentation in the emergency department, will recheck.  She did  not take her insulin.   Patient's presentation is most consistent with acute presentation with potential threat to life or bodily function.       FINAL CLINICAL IMPRESSION(S) / ED DIAGNOSES   Final diagnoses:  Pressure injury of buttock, stage 1, unspecified laterality     Rx / DC Orders   ED Discharge Orders          Ordered    doxycycline (VIBRAMYCIN) 50 MG capsule  2 times daily        06/22/22 0048             Note:  This document was prepared using Dragon voice recognition software and may include unintentional dictation errors.    Lucillie Garfinkel, MD 06/22/22 [phone]

## 2022-06-22 NOTE — ED Notes (Signed)
Pt cleared for discharge by edp wong

## 2022-07-27 ENCOUNTER — Other Ambulatory Visit: Payer: Self-pay | Admitting: Internal Medicine

## 2022-07-27 DIAGNOSIS — M7989 Other specified soft tissue disorders: Secondary | ICD-10-CM

## 2022-08-05 ENCOUNTER — Ambulatory Visit
Admission: RE | Admit: 2022-08-05 | Discharge: 2022-08-05 | Disposition: A | Discharge status: Home / Self Care | Payer: Medicare Other | Source: Ambulatory Visit | Attending: Internal Medicine | Admitting: Internal Medicine

## 2022-08-05 DIAGNOSIS — M7989 Other specified soft tissue disorders: Secondary | ICD-10-CM | POA: Insufficient documentation

## 2022-08-12 ENCOUNTER — Other Ambulatory Visit
Admission: RE | Admit: 2022-08-12 | Discharge: 2022-08-12 | Disposition: A | Discharge status: Home / Self Care | Payer: Medicare Other | Source: Ambulatory Visit | Attending: Ophthalmology | Admitting: Ophthalmology

## 2022-08-12 ENCOUNTER — Other Ambulatory Visit: Payer: Self-pay | Admitting: Ophthalmology

## 2022-08-12 DIAGNOSIS — H543 Unqualified visual loss, both eyes: Secondary | ICD-10-CM

## 2022-08-12 LAB — C-REACTIVE PROTEIN: CRP: 1.7 mg/dL — ABNORMAL HIGH (ref <1.0)

## 2022-08-12 LAB — SEDIMENTATION RATE: Sed Rate: 54 mm/hr — ABNORMAL HIGH (ref 0–30)

## 2022-08-16 ENCOUNTER — Ambulatory Visit
Admission: RE | Admit: 2022-08-16 | Discharge: 2022-08-16 | Disposition: A | Discharge status: Home / Self Care | Payer: Medicare Other | Source: Ambulatory Visit | Attending: Ophthalmology | Admitting: Ophthalmology

## 2022-08-16 DIAGNOSIS — H543 Unqualified visual loss, both eyes: Secondary | ICD-10-CM | POA: Diagnosis not present

## 2022-08-18 ENCOUNTER — Ambulatory Visit: Admission: RE | Admit: 2022-08-18 | Payer: Medicare Other | Source: Ambulatory Visit

## 2022-08-24 DIAGNOSIS — R519 Headache, unspecified: Secondary | ICD-10-CM | POA: Insufficient documentation

## 2022-08-24 DIAGNOSIS — H539 Unspecified visual disturbance: Secondary | ICD-10-CM | POA: Insufficient documentation

## 2022-08-24 NOTE — H&P (View-Only) (Signed)
MRN : [id number]  Jean Johnson is a 72 y.o. ([birth year]) female who presents with chief complaint of check circulation.  History of Present Illness:   Patient is sent for evaluation of headache and the possible need for temporal artery biopsy.  Patient notes the visual loss is located both eyes but left is more effected than the right The patient describes the visual loss as fuzziness and worsening toward the periphery The patient notes the headache is very severe. The duration of the visual loss has been for more than 1 week. The patient notes timing of the visual loss is occurring on a daily basis no waxing and waning. There do not appear to be any aggravating symptoms. The patient denies any alleviating symptoms. Patient denies any associated headache and does not have a history of migraine headaches.  Sed rate = 54 and CRP = 1.7  She was started on Prednisone 60 mg on 08/16/2022.  Carotid duplex is pending  MRI dated 08/16/2022 shows: No acute intracranial abnormality. 2.   Mild chronic small vessel ischemic disease, mildly progressed from 2023.  As a second issue she is noting swelling of the left leg which is tender.  The patient first noticed the swelling remotely but is now concerned because of a significant increase in the overall edema. The swelling isn't associated with significant pain.  There has been an increasing amount of  discoloration noted by the patient. The patient notes that in the morning the legs are improved but they steadily worsened throughout the course of the day. Elevation seems to make the swelling of the legs better, dependency makes them much worse.   There is no history of ulcerations associated with the swelling.   The patient denies any recent changes in their medications.  The patient has not been wearing graduated compression.  The patient has no had any past angiography, interventions or vascular surgery.  The  patient denies a history of DVT or PE. There is no prior history of phlebitis. There is no history of primary lymphedema.  There is no history of radiation treatment to the groin or pelvis No history of malignancies. No history of trauma or groin or pelvic surgery. No history of foreign travel or parasitic infections area.  Duplex ultrasound of the venous system dated 08/05/2022 shows: 1. No evidence of DVT within the left lower extremity. 2. Incidentally noted approximately 2.8 cm left-sided Baker's cyst.  No outpatient medications have been marked as taking for the 08/25/22 encounter (Appointment) with Delana Meyer, Dolores Lory, MD.    Past Medical History:  Diagnosis Date   Anemia of chronic renal disease    Aortic stenosis, mild    a.) TTE on 11/23/2020 --> mean gradient 9.7 mmHg   Atrial fibrillation ([place])    a.) CHA2DS2-VASc = 3 (age, sex, diabetes). b.) daily apixaban   B12 deficiency    Cervical spinal stenosis    Chronic anticoagulation    Apixaban   Diabetic neuropathy (HCC)    Diverticulosis    Dyspnea    ESRD (end stage renal disease) (HCC)    First degree AV block    HLD (hyperlipidemia)    Hypertension    Incomplete right bundle branch block (RBBB)    LAE (left atrial enlargement)    a.) TTE on 11/23/2020 --> moderate   Left thyroid nodule    a.) Cervical MRI on 12/29/2020 --> measured "  at least" 3.5 cm; imcompletely imaged.   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   Valvular heart disease    a.) TTE 11/23/2020 --> mild panvalvular regurgitation     Past Surgical History:  Procedure Laterality Date   ANTERIOR CERVICAL DECOMP/DISCECTOMY FUSION N/A 03/24/2021   Procedure: C3-6 ANTERIOR CERVICAL DECOMPRESSION/DISCECTOMY FUSION 3 LEVELS;  Surgeon: Meade Maw, MD;  Location: [facility];  Service: Neurosurgery;  Laterality: N/A;   CATARACT EXTRACTION W/PHACO Left 10/21/2020   Procedure: CATARACT EXTRACTION PHACO AND INTRAOCULAR LENS PLACEMENT ([place]) DIABETES LEFT;  Surgeon: Leandrew Koyanagi, MD;  Location: [place];  Service: Ophthalmology;  Laterality: Left;  2.84 0:48.8 5.8%   COLONOSCOPY WITH PROPOFOL N/A 09/03/2021   Procedure: COLONOSCOPY WITH PROPOFOL;  Surgeon: Lesly Rubenstein, MD;  Location: [facility];  Service: Endoscopy;  Laterality: N/A;  DM, ELIQUIS, WHEELCHAIR   EYE SURGERY Right 2020   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   Living donor organ recipient (sister)   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   Cadaveric organ recipient    Social History Social History   Tobacco Use   Smoking status: Never   Smokeless tobacco: Never  Vaping Use   Vaping Use: Never used  Substance Use Topics   Alcohol use: Not Currently   Drug use: Never    Family History Family History  Problem Relation Age of Onset   Diabetes Father    Heart disease Father     Allergies  Allergen Reactions   E-Mycin [Erythromycin] Swelling    And itching    Penicillins Swelling    Throat      REVIEW OF SYSTEMS (Negative unless checked)  Constitutional: []$ Weight loss  []$ Fever  []$ Chills Cardiac: []$ Chest pain   []$ Chest pressure   []$ Palpitations   []$ Shortness of breath when laying flat   []$ Shortness of breath with exertion. Vascular:  [x]$ Pain in legs with walking   []$ Pain in legs at rest  []$ History of DVT   []$ Phlebitis   []$ Swelling in legs   []$ Varicose veins   []$ Non-healing ulcers Pulmonary:   []$ Uses home oxygen   []$ Productive cough   []$ Hemoptysis   []$ Wheeze  []$ COPD   []$ Asthma Neurologic:  []$ Dizziness   []$ Seizures    []$ History of stroke   []$ History of TIA  []$ Aphasia   []$ Vissual changes   []$ Weakness or numbness in arm   []$ Weakness or numbness in leg Musculoskeletal:   []$ Joint swelling   []$ Joint pain   []$ Low back pain Hematologic:  []$ Easy bruising  []$ Easy bleeding   []$ Hypercoagulable state   []$ Anemic Gastrointestinal:  []$ Diarrhea   []$ Vomiting  []$ Gastroesophageal reflux/heartburn   []$ Difficulty swallowing. Genitourinary:  []$ Chronic kidney disease   []$ Difficult urination  []$ Frequent urination   []$ Blood in urine Skin:  []$ Rashes   []$ Ulcers  Psychological:  []$ History of anxiety   []$  History of major depression.  Physical Examination  There were no vitals filed for this visit. There is no height or weight on file to calculate BMI. Gen: WD/WN, NAD Head: [place]/AT, No temporalis wasting.  Ear/Nose/Throat: Hearing grossly intact, nares w/o erythema or drainage Eyes: PER, EOMI, sclera  nonicteric.  Neck: Supple, no masses.  No bruit or JVD.  Pulmonary:  Good air movement, no audible wheezing, no use of accessory muscles.  Cardiac: RRR, normal S1, S2, no Murmurs. Vascular:  nontender temporal artery with 1+ pulse;  2+ nontender pitting edema of the left leg. Vessel Right Left  Radial Palpable Palpable  Gastrointestinal: soft, non-distended. No guarding/no peritoneal signs.  Musculoskeletal: M/S 5/5 throughout.  No visible deformity.  Neurologic: CN 2-12 intact. Pain and light touch intact in extremities.  Symmetrical.  Speech is fluent. Motor exam as listed above. Psychiatric: Judgment intact, Mood & affect appropriate for pt's clinical situation. Dermatologic: No rashes or ulcers noted.  No changes consistent with cellulitis.   CBC Lab Results  Component Value Date   WBC 7.5 06/21/2022   HGB 10.0 (L) 06/21/2022   HCT 33.3 (L) 06/21/2022   MCV 100.0 06/21/2022   PLT 219 06/21/2022    BMET    Component Value Date/Time   NA 139 06/21/2022 1926   K 3.8 06/21/2022 1926   CL 110 06/21/2022 1926   CO2 17 (L)  06/21/2022 1926   GLUCOSE 66 (L) 06/21/2022 1926   BUN 37 (H) 06/21/2022 1926   CREATININE 1.95 (H) 06/21/2022 1926   CALCIUM 8.9 06/21/2022 1926   GFRNONAA 27 (L) 06/21/2022 1926   CrCl cannot be calculated (Patient's most recent lab result is older than the maximum 21 days allowed.).  COAG Lab Results  Component Value Date   INR 1.2 03/16/2021    Radiology MR BRAIN WO CONTRAST  Result Date: 08/18/2022 CLINICAL DATA:  Bilateral visual loss. EXAM: MRI HEAD WITHOUT CONTRAST TECHNIQUE: Multiplanar, multiecho pulse sequences of the brain and surrounding structures were obtained without intravenous contrast. COMPARISON:  Head CT 04/14/2022 and MRI 08/06/2021 FINDINGS: Brain: There is no evidence of an acute infarct, mass, midline shift, or extra-axial fluid collection. A few scattered chronic cerebral microhemorrhages are again noted. T2 hyperintensities in the cerebral white matter and pons have mildly progressed from the prior MRI and are nonspecific but compatible with mild chronic small vessel ischemic disease. Mild cerebral atrophy is within normal limits for age. Vascular: Major intracranial vascular flow voids are preserved. Skull and upper cervical spine: Unremarkable bone marrow signal. Prior anterior cervical spine fusion. Sinuses/Orbits: Bilateral cataract extraction. No significant inflammatory changes in the paranasal sinuses. Clear mastoid air cells. Other: None. IMPRESSION: 1. No acute intracranial abnormality. 2. Mild chronic small vessel ischemic disease, mildly progressed from 2023. Electronically Signed   By: Logan Bores M.D.   On: 08/18/2022 07:53   US Venous Img Lower Unilateral Left (DVT)  Result Date: 08/05/2022 CLINICAL DATA:  Left lower extremity pain and edema. Evaluate for DVT. EXAM: LEFT LOWER EXTREMITY VENOUS DOPPLER ULTRASOUND TECHNIQUE: Gray-scale sonography with graded compression, as well as color Doppler and duplex ultrasound were performed to evaluate the lower  extremity deep venous systems from the level of the common femoral vein and including the common femoral, femoral, profunda femoral, popliteal and calf veins including the posterior tibial, peroneal and gastrocnemius veins when visible. The superficial great saphenous vein was also interrogated. Spectral Doppler was utilized to evaluate flow at rest and with distal augmentation maneuvers in the common femoral, femoral and popliteal veins. COMPARISON:  None Available. FINDINGS: Contralateral Common Femoral Vein: Respiratory phasicity is normal and symmetric with the symptomatic side. No evidence of thrombus. Normal compressibility. Common Femoral Vein: No evidence of thrombus. Normal compressibility, respiratory phasicity and response to augmentation. Saphenofemoral Junction: No evidence  of thrombus. Normal compressibility and flow on color Doppler imaging. Profunda Femoral Vein: No evidence of thrombus. Normal compressibility and flow on color Doppler imaging. Femoral Vein: No evidence of thrombus. Normal compressibility, respiratory phasicity and response to augmentation. Popliteal Vein: No evidence of thrombus. Normal compressibility, respiratory phasicity and response to augmentation. Calf Veins: No evidence of thrombus. Normal compressibility and flow on color Doppler imaging. Superficial Great Saphenous Vein: No evidence of thrombus. Normal compressibility. Other Findings: There is an approximately 2.8 x 2.2 x 0.9 cm fluid collection within the left popliteal fossa compatible with a Baker's cyst. There is a minimal amount of subcutaneous edema at the level of the left lower leg and calf. IMPRESSION: 1. No evidence of DVT within the left lower extremity. 2. Incidentally noted approximately 2.8 cm left-sided Baker's cyst. Electronically Signed   By: Sandi Mariscal M.D.   On: 08/05/2022 14:32     Assessment/Plan 1. Visual changes Recommend:  The patient has signs and symptoms that are suggestive of temporal  arteritis  Patient should have a right temporal artery biopsy done.  The risks, benefits and alternative therapies were reviewed in detail with the patient.  All questions were answered.  The patient agrees to proceed with surgery.   The patient will follow up with me in the office after the surgery to review the pathology.  2. Pain and swelling of left lower leg Recommend:  I have had a long discussion with the patient regarding swelling and why it  causes symptoms.  Patient will begin wearing graduated compression on a daily basis a prescription was given. The patient will  wear the stockings first thing in the morning and removing them in the evening. The patient is instructed specifically not to sleep in the stockings.   In addition, behavioral modification will be initiated.  This will include frequent elevation, use of over the counter pain medications and exercise such as walking.  Consideration for a lymph pump will also be made based upon the effectiveness of conservative therapy.  This would help to improve the edema control and prevent sequela such as ulcers and infections   Duplex ultrasound of the venous system dated 08/05/2022 shows: 1. No evidence of DVT within the left lower extremity. 2. Incidentally noted approximately 2.8 cm left-sided Baker's cyst.  3. Atrial fibrillation, unspecified type ([place]) Continue antiarrhythmia medications as already ordered, these medications have been reviewed and there are no changes at this time.  Continue anticoagulation as ordered by Cardiology Service  4. Essential hypertension Continue antihypertensive medications as already ordered, these medications have been reviewed and there are no changes at this time.  5. Type 2 diabetes mellitus with other specified complication, unspecified whether long term insulin use ([place]) Continue hypoglycemic medications as already ordered, these medications have been reviewed and there are no changes at  this time.  Hgb A1C to be monitored as already arranged by primary service   Hortencia Pilar, MD  08/24/2022 2:36 PM

## 2022-08-24 NOTE — Progress Notes (Signed)
MRN : [id number]  Jean Johnson is a 72 y.o. (1950-07-28) female who presents with chief complaint of check circulation.  History of Present Illness:   Patient is sent for evaluation of headache and the possible need for temporal artery biopsy.  Patient notes the visual loss is located both eyes but left is more effected than the right The patient describes the visual loss as fuzziness and worsening toward the periphery The patient notes the headache is very severe. The duration of the visual loss has been for more than 1 week. The patient notes timing of the visual loss is occurring on a daily basis no waxing and waning. There do not appear to be any aggravating symptoms. The patient denies any alleviating symptoms. Patient denies any associated headache and does not have a history of migraine headaches.  Sed rate = 54 and CRP = 1.7  She was started on Prednisone 60 mg on 08/16/2022.  Carotid duplex is pending  MRI dated 08/16/2022 shows: No acute intracranial abnormality. 2.   Mild chronic small vessel ischemic disease, mildly progressed from 2023.  As a second issue she is noting swelling of the left leg which is tender.  The patient first noticed the swelling remotely but is now concerned because of a significant increase in the overall edema. The swelling isn't associated with significant pain.  There has been an increasing amount of  discoloration noted by the patient. The patient notes that in the morning the legs are improved but they steadily worsened throughout the course of the day. Elevation seems to make the swelling of the legs better, dependency makes them much worse.   There is no history of ulcerations associated with the swelling.   The patient denies any recent changes in their medications.  The patient has not been wearing graduated compression.  The patient has no had any past angiography, interventions or vascular surgery.  The  patient denies a history of DVT or PE. There is no prior history of phlebitis. There is no history of primary lymphedema.  There is no history of radiation treatment to the groin or pelvis No history of malignancies. No history of trauma or groin or pelvic surgery. No history of foreign travel or parasitic infections area.  Duplex ultrasound of the venous system dated 08/05/2022 shows: 1. No evidence of DVT within the left lower extremity. 2. Incidentally noted approximately 2.8 cm left-sided Baker's cyst.  No outpatient medications have been marked as taking for the 08/25/22 encounter (Appointment) with Delana Meyer, Dolores Lory, MD.    Past Medical History:  Diagnosis Date   Anemia of chronic renal disease    Aortic stenosis, mild    a.) TTE on 11/23/2020 --> mean gradient 9.7 mmHg   Atrial fibrillation ([place])    a.) CHA2DS2-VASc = 3 (age, sex, diabetes). b.) daily apixaban   B12 deficiency    Cervical spinal stenosis    Chronic anticoagulation    Apixaban   Diabetic neuropathy (HCC)    Diverticulosis    Dyspnea    ESRD (end stage renal disease) (HCC)    First degree AV block    HLD (hyperlipidemia)    Hypertension    Incomplete right bundle branch block (RBBB)    LAE (left atrial enlargement)    a.) TTE on 11/23/2020 --> moderate   Left thyroid nodule    a.) Cervical MRI on 12/29/2020 --> measured "  at least" 3.5 cm; imcompletely imaged.   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   Valvular heart disease    a.) TTE 11/23/2020 --> mild panvalvular regurgitation     Past Surgical History:  Procedure Laterality Date   ANTERIOR CERVICAL DECOMP/DISCECTOMY FUSION N/A 03/24/2021   Procedure: C3-6 ANTERIOR CERVICAL DECOMPRESSION/DISCECTOMY FUSION 3 LEVELS;  Surgeon: Meade Maw, MD;  Location: [facility];  Service: Neurosurgery;  Laterality: N/A;   CATARACT EXTRACTION W/PHACO Left 10/21/2020   Procedure: CATARACT EXTRACTION PHACO AND INTRAOCULAR LENS PLACEMENT ([place]) DIABETES LEFT;  Surgeon: Leandrew Koyanagi, MD;  Location: [facility];  Service: Ophthalmology;  Laterality: Left;  2.84 0:48.8 5.8%   COLONOSCOPY WITH PROPOFOL N/A 09/03/2021   Procedure: COLONOSCOPY WITH PROPOFOL;  Surgeon: Lesly Rubenstein, MD;  Location: [facility];  Service: Endoscopy;  Laterality: N/A;  DM, ELIQUIS, WHEELCHAIR   EYE SURGERY Right 2020   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   Living donor organ recipient (sister)   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   Cadaveric organ recipient    Social History Social History   Tobacco Use   Smoking status: Never   Smokeless tobacco: Never  Vaping Use   Vaping Use: Never used  Substance Use Topics   Alcohol use: Not Currently   Drug use: Never    Family History Family History  Problem Relation Age of Onset   Diabetes Father    Heart disease Father     Allergies  Allergen Reactions   E-Mycin [Erythromycin] Swelling    And itching    Penicillins Swelling    Throat      REVIEW OF SYSTEMS (Negative unless checked)  Constitutional: []$ Weight loss  []$ Fever  []$ Chills Cardiac: []$ Chest pain   []$ Chest pressure   []$ Palpitations   []$ Shortness of breath when laying flat   []$ Shortness of breath with exertion. Vascular:  [x]$ Pain in legs with walking   []$ Pain in legs at rest  []$ History of DVT   []$ Phlebitis   []$ Swelling in legs   []$ Varicose veins   []$ Non-healing ulcers Pulmonary:   []$ Uses home oxygen   []$ Productive cough   []$ Hemoptysis   []$ Wheeze  []$ COPD   []$ Asthma Neurologic:  []$ Dizziness   []$ Seizures    []$ History of stroke   []$ History of TIA  []$ Aphasia   []$ Vissual changes   []$ Weakness or numbness in arm   []$ Weakness or numbness in leg Musculoskeletal:   []$ Joint swelling   []$ Joint pain   []$ Low back pain Hematologic:  []$ Easy bruising  []$ Easy bleeding   []$ Hypercoagulable state   []$ Anemic Gastrointestinal:  []$ Diarrhea   []$ Vomiting  []$ Gastroesophageal reflux/heartburn   []$ Difficulty swallowing. Genitourinary:  []$ Chronic kidney disease   []$ Difficult urination  []$ Frequent urination   []$ Blood in urine Skin:  []$ Rashes   []$ Ulcers  Psychological:  []$ History of anxiety   []$  History of major depression.  Physical Examination  There were no vitals filed for this visit. There is no height or weight on file to calculate BMI. Gen: WD/WN, NAD Head: [place]/AT, No temporalis wasting.  Ear/Nose/Throat: Hearing grossly intact, nares w/o erythema or drainage Eyes: PER, EOMI, sclera  nonicteric.  Neck: Supple, no masses.  No bruit or JVD.  Pulmonary:  Good air movement, no audible wheezing, no use of accessory muscles.  Cardiac: RRR, normal S1, S2, no Murmurs. Vascular:  nontender temporal artery with 1+ pulse;  2+ nontender pitting edema of the left leg. Vessel Right Left  Radial Palpable Palpable  Gastrointestinal: soft, non-distended. No guarding/no peritoneal signs.  Musculoskeletal: M/S 5/5 throughout.  No visible deformity.  Neurologic: CN 2-12 intact. Pain and light touch intact in extremities.  Symmetrical.  Speech is fluent. Motor exam as listed above. Psychiatric: Judgment intact, Mood & affect appropriate for pt's clinical situation. Dermatologic: No rashes or ulcers noted.  No changes consistent with cellulitis.   CBC Lab Results  Component Value Date   WBC 7.5 06/21/2022   HGB 10.0 (L) 06/21/2022   HCT 33.3 (L) 06/21/2022   MCV 100.0 06/21/2022   PLT 219 06/21/2022    BMET    Component Value Date/Time   NA 139 06/21/2022 1926   K 3.8 06/21/2022 1926   CL 110 06/21/2022 1926   CO2 17 (L)  06/21/2022 1926   GLUCOSE 66 (L) 06/21/2022 1926   BUN 37 (H) 06/21/2022 1926   CREATININE 1.95 (H) 06/21/2022 1926   CALCIUM 8.9 06/21/2022 1926   GFRNONAA 27 (L) 06/21/2022 1926   CrCl cannot be calculated (Patient's most recent lab result is older than the maximum 21 days allowed.).  COAG Lab Results  Component Value Date   INR 1.2 03/16/2021    Radiology MR BRAIN WO CONTRAST  Result Date: 08/18/2022 CLINICAL DATA:  Bilateral visual loss. EXAM: MRI HEAD WITHOUT CONTRAST TECHNIQUE: Multiplanar, multiecho pulse sequences of the brain and surrounding structures were obtained without intravenous contrast. COMPARISON:  Head CT 04/14/2022 and MRI 08/06/2021 FINDINGS: Brain: There is no evidence of an acute infarct, mass, midline shift, or extra-axial fluid collection. A few scattered chronic cerebral microhemorrhages are again noted. T2 hyperintensities in the cerebral white matter and pons have mildly progressed from the prior MRI and are nonspecific but compatible with mild chronic small vessel ischemic disease. Mild cerebral atrophy is within normal limits for age. Vascular: Major intracranial vascular flow voids are preserved. Skull and upper cervical spine: Unremarkable bone marrow signal. Prior anterior cervical spine fusion. Sinuses/Orbits: Bilateral cataract extraction. No significant inflammatory changes in the paranasal sinuses. Clear mastoid air cells. Other: None. IMPRESSION: 1. No acute intracranial abnormality. 2. Mild chronic small vessel ischemic disease, mildly progressed from 2023. Electronically Signed   By: Logan Bores M.D.   On: 08/18/2022 07:53   US Venous Img Lower Unilateral Left (DVT)  Result Date: 08/05/2022 CLINICAL DATA:  Left lower extremity pain and edema. Evaluate for DVT. EXAM: LEFT LOWER EXTREMITY VENOUS DOPPLER ULTRASOUND TECHNIQUE: Gray-scale sonography with graded compression, as well as color Doppler and duplex ultrasound were performed to evaluate the lower  extremity deep venous systems from the level of the common femoral vein and including the common femoral, femoral, profunda femoral, popliteal and calf veins including the posterior tibial, peroneal and gastrocnemius veins when visible. The superficial great saphenous vein was also interrogated. Spectral Doppler was utilized to evaluate flow at rest and with distal augmentation maneuvers in the common femoral, femoral and popliteal veins. COMPARISON:  None Available. FINDINGS: Contralateral Common Femoral Vein: Respiratory phasicity is normal and symmetric with the symptomatic side. No evidence of thrombus. Normal compressibility. Common Femoral Vein: No evidence of thrombus. Normal compressibility, respiratory phasicity and response to augmentation. Saphenofemoral Junction: No evidence  of thrombus. Normal compressibility and flow on color Doppler imaging. Profunda Femoral Vein: No evidence of thrombus. Normal compressibility and flow on color Doppler imaging. Femoral Vein: No evidence of thrombus. Normal compressibility, respiratory phasicity and response to augmentation. Popliteal Vein: No evidence of thrombus. Normal compressibility, respiratory phasicity and response to augmentation. Calf Veins: No evidence of thrombus. Normal compressibility and flow on color Doppler imaging. Superficial Great Saphenous Vein: No evidence of thrombus. Normal compressibility. Other Findings: There is an approximately 2.8 x 2.2 x 0.9 cm fluid collection within the left popliteal fossa compatible with a Baker's cyst. There is a minimal amount of subcutaneous edema at the level of the left lower leg and calf. IMPRESSION: 1. No evidence of DVT within the left lower extremity. 2. Incidentally noted approximately 2.8 cm left-sided Baker's cyst. Electronically Signed   By: Sandi Mariscal M.D.   On: 08/05/2022 14:32     Assessment/Plan 1. Visual changes Recommend:  The patient has signs and symptoms that are suggestive of temporal  arteritis  Patient should have a right temporal artery biopsy done.  The risks, benefits and alternative therapies were reviewed in detail with the patient.  All questions were answered.  The patient agrees to proceed with surgery.   The patient will follow up with me in the office after the surgery to review the pathology.  2. Pain and swelling of left lower leg Recommend:  I have had a long discussion with the patient regarding swelling and why it  causes symptoms.  Patient will begin wearing graduated compression on a daily basis a prescription was given. The patient will  wear the stockings first thing in the morning and removing them in the evening. The patient is instructed specifically not to sleep in the stockings.   In addition, behavioral modification will be initiated.  This will include frequent elevation, use of over the counter pain medications and exercise such as walking.  Consideration for a lymph pump will also be made based upon the effectiveness of conservative therapy.  This would help to improve the edema control and prevent sequela such as ulcers and infections   Duplex ultrasound of the venous system dated 08/05/2022 shows: 1. No evidence of DVT within the left lower extremity. 2. Incidentally noted approximately 2.8 cm left-sided Baker's cyst.  3. Atrial fibrillation, unspecified type ([place]) Continue antiarrhythmia medications as already ordered, these medications have been reviewed and there are no changes at this time.  Continue anticoagulation as ordered by Cardiology Service  4. Essential hypertension Continue antihypertensive medications as already ordered, these medications have been reviewed and there are no changes at this time.  5. Type 2 diabetes mellitus with other specified complication, unspecified whether long term insulin use ([place]) Continue hypoglycemic medications as already ordered, these medications have been reviewed and there are no changes at  this time.  Hgb A1C to be monitored as already arranged by primary service   Hortencia Pilar, MD  08/24/2022 2:36 PM

## 2022-08-25 ENCOUNTER — Encounter (INDEPENDENT_AMBULATORY_CARE_PROVIDER_SITE_OTHER): Payer: Self-pay | Admitting: Vascular Surgery

## 2022-08-25 ENCOUNTER — Telehealth (INDEPENDENT_AMBULATORY_CARE_PROVIDER_SITE_OTHER): Payer: Self-pay

## 2022-08-25 ENCOUNTER — Ambulatory Visit (INDEPENDENT_AMBULATORY_CARE_PROVIDER_SITE_OTHER): Payer: Medicare Other | Admitting: Vascular Surgery

## 2022-08-25 VITALS — BP 160/65 | HR 71 | Resp 16

## 2022-08-25 DIAGNOSIS — R519 Headache, unspecified: Secondary | ICD-10-CM

## 2022-08-25 DIAGNOSIS — I4891 Unspecified atrial fibrillation: Secondary | ICD-10-CM

## 2022-08-25 DIAGNOSIS — M79662 Pain in left lower leg: Secondary | ICD-10-CM | POA: Diagnosis not present

## 2022-08-25 DIAGNOSIS — H539 Unspecified visual disturbance: Secondary | ICD-10-CM | POA: Diagnosis not present

## 2022-08-25 DIAGNOSIS — I10 Essential (primary) hypertension: Secondary | ICD-10-CM | POA: Diagnosis not present

## 2022-08-25 DIAGNOSIS — M7989 Other specified soft tissue disorders: Secondary | ICD-10-CM

## 2022-08-25 DIAGNOSIS — E1169 Type 2 diabetes mellitus with other specified complication: Secondary | ICD-10-CM

## 2022-08-25 NOTE — Telephone Encounter (Signed)
Spoke with the patient and she is scheduled with Dr. Delana Meyer for a temporal artery biopsy on 08/30/22 at the [facility]. Pre-op phone call is on 08/26/22 between 1-5 pm. Pre-surgical instructions were discussed and will be mailed.

## 2022-08-26 ENCOUNTER — Encounter: Payer: Self-pay | Admitting: Vascular Surgery

## 2022-08-26 ENCOUNTER — Other Ambulatory Visit (INDEPENDENT_AMBULATORY_CARE_PROVIDER_SITE_OTHER): Payer: Self-pay | Admitting: Nurse Practitioner

## 2022-08-26 ENCOUNTER — Encounter
Admission: RE | Admit: 2022-08-26 | Discharge: 2022-08-26 | Disposition: A | Discharge status: Home / Self Care | Payer: Medicare Other | Source: Ambulatory Visit | Attending: Vascular Surgery | Admitting: Vascular Surgery

## 2022-08-26 VITALS — Ht 60.0 in | Wt 109.0 lb

## 2022-08-26 DIAGNOSIS — H539 Unspecified visual disturbance: Secondary | ICD-10-CM

## 2022-08-26 DIAGNOSIS — E1142 Type 2 diabetes mellitus with diabetic polyneuropathy: Secondary | ICD-10-CM

## 2022-08-26 DIAGNOSIS — I10 Essential (primary) hypertension: Secondary | ICD-10-CM

## 2022-08-26 DIAGNOSIS — Z01812 Encounter for preprocedural laboratory examination: Secondary | ICD-10-CM

## 2022-08-26 DIAGNOSIS — I4891 Unspecified atrial fibrillation: Secondary | ICD-10-CM

## 2022-08-26 HISTORY — DX: Gastrojejunal ulcer, unspecified as acute or chronic, without hemorrhage or perforation: K28.9

## 2022-08-26 HISTORY — DX: Hypothyroidism, unspecified: E03.9

## 2022-08-26 HISTORY — DX: Anal fistula: K60.3

## 2022-08-26 HISTORY — DX: Anal fistula, unspecified: K60.30

## 2022-08-26 HISTORY — DX: Tubulo-interstitial nephritis, not specified as acute or chronic: N12

## 2022-08-26 NOTE — Patient Instructions (Addendum)
Your procedure is scheduled on: Tuesday, February 20 Report to the Registration Desk on the 1st floor of the [facility]. To find out your arrival time, please call [phone] between 1PM - 3PM on: Monday, February 19 If your arrival time is 6:00 am, do not arrive before that time as the [facility] entrance doors do not open until 6:00 am.  REMEMBER: Instructions that are not followed completely may result in serious medical risk, up to and including death; or upon the discretion of your surgeon and anesthesiologist your surgery may need to be rescheduled.  Do not eat food after midnight the night before surgery.  No gum chewing or hard candies.  You may however, drink water up to 2 hours before you are scheduled to arrive for your surgery. Do not drink anything within 2 hours of your scheduled arrival time.  One week prior to surgery: starting today, February 16 Stop Anti-inflammatories (NSAIDS) such as Advil, Aleve, Ibuprofen, Motrin, Naproxen, Naprosyn and Aspirin based products such as Excedrin, Goody's Powder, BC Powder. Stop ANY OVER THE COUNTER supplements until after surgery. You may however, continue to take Tylenol if needed for pain up until the day of surgery.  Continue taking all prescribed medications with the exception of the following:  Eliquis (apixaban) - do not take on Monday, February 19 or Tuesday, February 20. Resume AFTER surgery per surgeon instruction.  Levemir - only take half of your regular dose the night before surgery. Only take 4 units on Monday night, February 19.  TAKE ONLY THESE MEDICATIONS THE MORNING OF SURGERY WITH A SIP OF WATER:  Atorvastatin (Lipitor) Ezetimibe Flecainide Mycophenolate Prednisone Pregabalin (Lyrica) Tacrolimus  No Alcohol for 24 hours before or after surgery.  No Smoking including e-cigarettes for 24 hours before surgery.  No chewable tobacco products for at least 6 hours before surgery.  No nicotine patches on the  day of surgery.  Do not use any "recreational" drugs for at least a week (preferably 2 weeks) before your surgery.  Please be advised that the combination of cocaine and anesthesia may have negative outcomes, up to and including death. If you test positive for cocaine, your surgery will be cancelled.  On the morning of surgery brush your teeth with toothpaste and water, you may rinse your mouth with mouthwash if you wish. Do not swallow any toothpaste or mouthwash.  Do not wear jewelry, make-up, hairpins, clips or nail polish.  Do not wear lotions, powders, or perfumes.   Do not shave body hair from the neck down 48 hours before surgery.  Contact lenses, hearing aids and dentures may not be worn into surgery.  Do not bring valuables to the hospital. [facility] is not responsible for any missing/lost belongings or valuables.   Notify your doctor if there is any change in your medical condition (cold, fever, infection).  Wear comfortable clothing (specific to your surgery type) to the hospital.  After surgery, you can help prevent lung complications by doing breathing exercises.  Take deep breaths and cough every 1-2 hours. Your doctor may order a device called an Incentive Spirometer to help you take deep breaths.  If you are being discharged the day of surgery, you will not be allowed to drive home. You will need a responsible individual to drive you home and stay with you for 24 hours after surgery.   If you are taking public transportation, you will need to have a responsible individual with you.  Please call the Pre-admissions  Testing Dept. at [phone] if you have any questions about these instructions.  Surgery Visitation Policy:  Patients undergoing a surgery or procedure may have two family members or support persons with them as long as the person is not COVID-19 positive or experiencing its symptoms.

## 2022-08-26 NOTE — Progress Notes (Signed)
Perioperative Services  Pre-Admission/Anesthesia Testing Clinical Review  Date: 08/29/22  Patient Demographics:  Name: Jean Johnson DOB:   1950/07/13 MRN:   [id number]  Planned Surgical Procedure(s):    Case: [id number] Date/Time: 08/30/22 1530   Procedure: BIOPSY TEMPORAL ARTERY (Right)   Anesthesia type: General   Pre-op diagnosis: TEMPORAL ARTERITIS   Location: [facility] [place] [facility]   Surgeons: Katha Cabal, MD   NOTE: Available PAT nursing documentation and vital signs have been reviewed. Clinical nursing staff has updated patient's PMH/PSHx, current medication list, and drug allergies/intolerances to ensure comprehensive history available to assist in medical decision making as it pertains to the aforementioned surgical procedure and anticipated anesthetic course. Extensive review of available clinical information performed. [place] [facility] and PSHx updated with any diagnoses/procedures that  may have been inadvertently omitted during her intake with the pre-admission testing department's nursing staff.  Clinical Discussion:  Jean Johnson is a 72 y.o. female who is submitted for pre-surgical anesthesia review and clearance prior to her undergoing the above procedure. Patient has never been a smoker. Pertinent PMH includes: atrial fibrillation, 1st degree AV block, IRBBB, cardiac murmur, aortic stenosis, HLD, posttransplant diabetes, ESRD (s/p renal transplant), dyspnea, anemia of chronic renal disease, cervical spinal stenosis, diabetic neuropathy.  Patient is followed by cardiology Clayborn Bigness, MD). She was last seen in the cardiology clinic on 10/28/2021; notes reviewed. At the time of her clinic visit, patient doing well overall from a cardiovascular perspective. She complained of intermittent vertiginous symptoms that was noted to be stable overall. Patient on meclizine, however advised that it was ineffective in managing her symptoms. She  also complained of chronic peripheral edema (L>R) and intermittent episodes of palpitations. Plans were to see ENT in the near future. Patient denied any chest pain, shortness of breath, PND, orthopnea, weakness, fatigue, or presyncope/syncope. Patient with a past medical history significant for cardiovascular diagnoses. Documented physical exam was grossly benign, providing no evidence of acute exacerbation and/or decompensation of the patient's known cardiovascular conditions.  TTE performed on 11/23/2020 revealed globally normal systolic function with moderate LVH.  LVEF estimated at >55%.  There was mild pan valvular regurgitation noted.  Additionally, there was mild aortic valve stenosis observed; mean gradient 9.7 mmHg; AVA (VTI) = 1.33 cm.  There was a small/mild pericardial effusion.  TTE was performed on 04/29/2022 revealing a normal left ventricular systolic function with an EF of 60-65%.  There was severe BILATERAL atrial enlargement.  Mild mitral valve regurgitation noted.  Aortic valve sclerosis present with no evidence of stenosis.  PMH significant for atrial fibrillation.  CHA2DS2-VASc Score = 4  (age, sex, HTN, diabetes).  Rate and rhythm controlled with oral diltiazem + flecainide.  Patient chronically anticoagulated with daily apixaban; compliant with therapy with no evidence of GI bleeding.  Blood pressure well-controlled at 130/62 mmHg on currently prescribed diuretic (furosemide), ACEI (lisinopril), and the aforementioned medications used for her atrial fibrillation.  Patient on atorvastatin for her HLD diagnosis and ASCVD prevention.  Patient with a significant renovascular history.  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  Patient underwent a cadaveric transplant in 2010 and has remained on antirejection therapy using mycophenolate, prednisone, and tacrolimus since.  Post transplant, patient developed diabetes, which is well  controlled; last Hgb A1c was 6.1% when checked on 06/16/2022. Functional capacity limited by back pain. Patient requires the assistance of ambulation aids (  cane).  Despite pain, patient felt to be able to achieve at least 4 METS of activity without experiencing any degree of significant angina/anginal equivalent symptoms. Patient stable overall from a cardiovascular perspective.  No changes were made to her medication regimen.  Patient to follow-up with outpatient cardiology in 4 to 5 months or sooner if needed.  Patient is scheduled to undergo a BIOPSY TEMPORAL ARTERY (Right) on 08/30/2022 by Dr. Hortencia Pilar, MD. Given patient's past medical history significant for cardiovascular diagnoses, presurgical clearances were sought from patient's primary cardiologist,   Per cardiology Clayborn Bigness, MD), "this patient is optimized for surgery and may proceed with the planned procedural course with at a LOW risk of significant perioperative cardiovascular complications".  Again, this patient is on daily anticoagulation therapy.  She has been instructed on recommendations for holding her apixaban for 2 days prior to her procedure with plans to restart as soon as postoperative bleeding risk felt to be minimized by her primary attending surgeon.  The patient is aware that her last dose of apixaban will be on 08/28/2022.  Patient reports previous perioperative complications with anesthesia in the past. Patient has a PMH (+) for PONV. Symptoms and history of PONV will be discussed with patient by anesthesia team on the day of her procedure. Interventions will be ordered as deemed necessary based on patient's individual care needs as determined by anesthesiologist. In review of the available records, it is noted that patient underwent a general anesthetic course here at [facility] ([facility]) in 03/2021 without documented complications.      08/26/2022    2:23 PM 08/25/2022    8:53 AM  06/22/2022    2:09 AM  Vitals with BMI  Height 5' 0"$     Weight 109 lbs    BMI [id number]    Systolic  0000000 [id number]  Diastolic  65 87  Pulse  71 101    Providers/Specialists:   NOTE: Primary physician provider listed below. Patient may have been seen by APP or partner within same practice.   PROVIDER ROLE / SPECIALTY LAST OV  Schnier, Dolores Lory, MD Vascular Surgery 08/25/2022  Gladstone Lighter, MD Primary Care Provider 07/27/2022  Katrine Coho, MD Cardiology 10/28/2021  Lavone Orn, MD Endocrinology 03/24/2022  Jennings Books, MD Neurology 03/09/2022  Lyla Son, MD Nephrology 08/24/2022  Sharlet Salina, MD Physiatry 03/23/2022   Allergies:  E-mycin [erythromycin] and Penicillins  Current Home Medications:   No current facility-administered medications for this encounter.    alendronate (FOSAMAX) 70 MG tablet   apixaban (ELIQUIS) 5 MG TABS tablet   atorvastatin (LIPITOR) 10 MG tablet   diltiazem (CARDIZEM CD) 240 MG 24 hr capsule   ezetimibe (ZETIA) 10 MG tablet   flecainide (TAMBOCOR) 100 MG tablet   furosemide (LASIX) 20 MG tablet   insulin detemir (LEVEMIR) 100 UNIT/ML injection   insulin lispro (HUMALOG) 100 UNIT/ML injection   lisinopril (ZESTRIL) 30 MG tablet   mycophenolate (CELLCEPT) 500 MG tablet   Potassium Chloride ER 20 MEQ TBCR   predniSONE (DELTASONE) 20 MG tablet   pregabalin (LYRICA) 50 MG capsule   tacrolimus (PROGRAF) 1 MG capsule   traZODone (DESYREL) 50 MG tablet   History:   Past Medical History:  Diagnosis Date   Anemia of chronic renal disease    Aortic stenosis, mild    a.) TTE on 11/23/2020 --> mean gradient 9.7 mmHg   Atrial fibrillation (HCC)    a.) CHA2DS2VASc = 4 (age, sex, HTN, T2DM);  b.) rate/rhythm maintained on oral diltiazem + flecainide; chronically anticoagulated with apixaban   B12 deficiency    Cervical spinal stenosis    Diabetic neuropathy (HCC)    Diverticulosis    Dyspnea    ESRD (end stage renal disease)  (HCC)    First degree AV block    Gastrointestinal ulcer    HLD (hyperlipidemia)    Hypertension    Hypothyroidism    Incomplete right bundle branch block (RBBB)    LAE (left atrial enlargement)    a.) TTE on 11/23/2020 --> moderate   Left thyroid nodule    a.) cervical MRI on 12/29/2020 --> measured "at least" 3.5 cm; incompletely imaged.   Long term current use of anticoagulant    a.) apixaban   Long-term use of immunosuppressant medication    a.) takes daily mycophenolate, tacrolimus, prednisone   Murmur    Nephrolithiasis    Osteoporosis    Perianal fistula    PONV (postoperative nausea and vomiting)    Post-transplant diabetes mellitus ([place])    Pyelonephritis    Renal transplant recipient    a.) living donor transplant from sister on 12/26/1998; rejected organ in 2006 and restarted on hemodialysis. 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   Valvular regurgitation    a.) TTE 11/23/2020 --> mild panvalvular regurgitation; b.) TTE 04/29/2022: mild MR   Past Surgical History:  Procedure Laterality Date   A/V FISTULAGRAM Left    ANTERIOR CERVICAL DECOMP/DISCECTOMY FUSION N/A 03/24/2021   Procedure: C3-6 ANTERIOR CERVICAL DECOMPRESSION/DISCECTOMY FUSION 3 LEVELS;  Surgeon: Meade Maw, MD;  Location: [facility] ORS;  Service: Neurosurgery;  Laterality: N/A;   CARPAL TUNNEL RELEASE Right 2016   CATARACT EXTRACTION W/ INTRAOCULAR LENS IMPLANT Right 03/2020   CATARACT EXTRACTION W/PHACO Left 10/21/2020   Procedure: CATARACT EXTRACTION PHACO AND INTRAOCULAR LENS PLACEMENT ([place]) DIABETES LEFT;  Surgeon: Leandrew Koyanagi, MD;  Location: [place];  Service: Ophthalmology;  Laterality: Left;  2.84 0:48.8 5.8%   COLONOSCOPY WITH PROPOFOL N/A 09/03/2021   Procedure: COLONOSCOPY WITH PROPOFOL;  Surgeon: Lesly Rubenstein, MD;  Location: [facility] ENDOSCOPY;  Service: Endoscopy;  Laterality: N/A;  DM, ELIQUIS, WHEELCHAIR   EYE  SURGERY Right 2020   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   Living donor organ recipient (sister)   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   Cadaveric organ recipient   Family History  Problem Relation Age of Onset   Diabetes Father    Heart disease Father    Social History   Tobacco Use   Smoking status: Never   Smokeless tobacco: Never  Vaping Use   Vaping Use: Never used  Substance Use Topics   Alcohol use: Not Currently   Drug use: Never    Pertinent Clinical Results:  LABS: Labs reviewed: Acceptable for surgery.  Hospital Outpatient Visit on 08/29/2022  Component Date Value Ref Range Status   Sodium 08/29/2022 141  135 - 145 mmol/L Final   Potassium 08/29/2022 4.1  3.5 - 5.1 mmol/L Final   Chloride 08/29/2022 111  98 - 111 mmol/L Final   CO2 08/29/2022 19 (L)  22 - 32 mmol/L Final   Glucose, Bld 08/29/2022 191 (H)  70 - 99 mg/dL Final   Glucose reference range applies only to samples taken after fasting for at least 8 hours.   BUN 08/29/2022 38 (H)  8 - 23 mg/dL Final   Creatinine, Ser 08/29/2022 1.26 (H)  0.44 - 1.00 mg/dL Final   Calcium 08/29/2022 9.0  8.9 - 10.3 mg/dL Final   GFR, Estimated 08/29/2022 45 (L)  >60 mL/min Final   Comment: (NOTE) Calculated using the CKD-EPI Creatinine Equation (2021)    Anion gap 08/29/2022 11  5 - 15 Final   Performed at [facility], [place], [place] [address]   WBC 08/29/2022 10.9 (H)  4.0 - 10.5 K/uL Final   RBC 08/29/2022 3.53 (L)  3.87 - 5.11 MIL/uL Final   Hemoglobin 08/29/2022 10.7 (L)  12.0 - 15.0 g/dL Final   HCT 08/29/2022 34.4 (L)  36.0 - 46.0 % Final   MCV 08/29/2022 97.5  80.0 - 100.0 fL Final   MCH 08/29/2022 30.3  26.0 - 34.0 pg Final   MCHC 08/29/2022 31.1  30.0 - 36.0 g/dL Final   RDW 08/29/2022 18.2 (H)  11.5 - 15.5 % Final   Platelets 08/29/2022 200  150 - 400 K/uL Final   nRBC 08/29/2022 0.0  0.0 - 0.2 % Final   Performed at [facility], [address] [place], [place]  [address]   MRSA, PCR 08/29/2022 NEGATIVE  NEGATIVE Final   Staphylococcus aureus 08/29/2022 NEGATIVE  NEGATIVE Final   Comment: (NOTE) The Xpert SA Assay (FDA approved for NASAL specimens in patients 11 years of age and older), is one component of a comprehensive surveillance program. It is not intended to diagnose infection nor to guide or monitor treatment. Performed at [facility], [address] [place], [place] [address]     ECG: Date: 08/29/2022  Time ECG obtained: 1125 AM Rate: 81 bpm Rhythm: atrial flutter Axis (leads I and aVF): Normal Intervals: QRS 88. QTc 494.  ST segment and T wave changes: No evidence of ST segment elevation or depression Comparison: Similar to previous tracing obtained on 04/25/2022   IMAGING / PROCEDURES: TRANSTHORACIC ECHOCARDIOGRAM performed on 04/29/2022 Left ventricular ejection fraction, by estimation, is 60 to 65%. The left ventricle has normal function. The left ventricle has no regional wall motion abnormalities. There is severe concentric left ventricular hypertrophy. Left ventricular diastolic function could not be evaluated.  Right ventricular systolic function is moderately reduced. The right ventricular size is mildly enlarged. Severely increased right ventricular wall thickness.  Left atrial size was severely dilated.  Right atrial size was severely dilated.  A small pericardial effusion is present. The pericardial effusion is circumferential.  The mitral valve is grossly normal. Mild mitral valve regurgitation.  The aortic valve is calcified. Aortic valve regurgitation is not visualized. Aortic valve sclerosis is present, with no evidence of aortic valve stenosis.   MRI CERVICAL SPINE WITHOUT CONTRAST performed on 12/29/2020 Advanced cervical and upper thoracic spondylosis. At C3-C4, there is grade 1 anterolisthesis. 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 T2 hyperintense signal abnormality  within the spinal cord at this level, which may reflect focal edema and/or myelomalacia. Bilateral neural foraminal narrowing (moderate right, severe left). At C5-C6, a posterior disc osteophyte complex contributes to severe spinal canal stenosis (greater on the right) with spinal cord flattening. T2 hyperintense signal abnormality within the right aspect of the spinal cord at this level compatible with myelomalacia and/or focal edema. Bilateral neural foraminal narrowing (severe right, moderate/severe left). At C7-T1, a posterior disc osteophyte complex contributes to mild/moderate spinal canal stenosis, contacting and minimally flattening the ventral spinal cord. Bilateral neural foraminal narrowing (moderate/severe right, severe left). No more than mild spinal canal stenosis at the remaining levels. Reversal of the expected cervical lordosis. Grade 1 anterolisthesis also present  at C4-C5. Incompletely imaged left thyroid nodule measuring at least 3.5 cm. A dedicated thyroid ultrasound is recommended for further evaluation.  Impression and Plan:  Jean Johnson has been referred for pre-anesthesia review and clearance prior to her undergoing the planned anesthetic and procedural courses. Available labs, pertinent testing, and imaging results were personally reviewed by me in preparation for upcoming operative/procedural course. [facility] medical record has been updated following extensive record review and patient interview with PAT staff.   This patient has been appropriately cleared by cardiology with an overall LOW risk of significant perioperative cardiovascular complications. Based on clinical review performed today (08/29/22), barring any significant acute changes in the patient's overall condition, it is anticipated that she will be able to proceed with the planned surgical intervention. Any acute changes in clinical condition may necessitate her procedure being postponed and/or cancelled.  Patient will meet with anesthesia team (MD and/or CRNA) on the day of her procedure for preoperative evaluation/assessment. Questions regarding anesthetic course will be fielded at that time.   Pre-surgical instructions were reviewed with the patient during her PAT appointment, and questions were fielded to satisfaction by PAT clinical staff. She has been instructed on which medications that she will need to hold prior to surgery, as well as the ones that have been deemed safe/appropriate to take of the day of her procedure. As part of the general education provided by PAT, patient made aware both verbally and in writing, that she would need to abstain from the use of any illegal substances during her perioperative course. She was advised that failure to follow the provided instructions could necessitate case cancellation or result serious perioperative complications up to and including death. Patient encouraged to contact PAT and/or her surgeon's office to discuss any questions or concerns that may arise prior to surgery; verbalized understanding.   Honor Loh, MSN, APRN, FNP-C, CEN [id number]  Peri-operative Services Nurse Practitioner Phone: [phone] Fax: [phone] 08/29/22 5:14 PM  NOTE: This note has been prepared using Dragon dictation software. Despite my best ability to proofread, there is always the potential that unintentional transcriptional errors may still occur from this process.

## 2022-08-28 ENCOUNTER — Encounter (INDEPENDENT_AMBULATORY_CARE_PROVIDER_SITE_OTHER): Payer: Self-pay | Admitting: Vascular Surgery

## 2022-08-28 DIAGNOSIS — M79662 Pain in left lower leg: Secondary | ICD-10-CM | POA: Insufficient documentation

## 2022-08-29 ENCOUNTER — Encounter
Admission: RE | Admit: 2022-08-29 | Discharge: 2022-08-29 | Disposition: A | Discharge status: Home / Self Care | Payer: Medicare Other | Source: Ambulatory Visit | Attending: Vascular Surgery | Admitting: Vascular Surgery

## 2022-08-29 DIAGNOSIS — I4439 Other atrioventricular block: Secondary | ICD-10-CM | POA: Diagnosis not present

## 2022-08-29 DIAGNOSIS — I10 Essential (primary) hypertension: Secondary | ICD-10-CM | POA: Insufficient documentation

## 2022-08-29 DIAGNOSIS — I4891 Unspecified atrial fibrillation: Secondary | ICD-10-CM

## 2022-08-29 DIAGNOSIS — H539 Unspecified visual disturbance: Secondary | ICD-10-CM | POA: Diagnosis not present

## 2022-08-29 DIAGNOSIS — Z01810 Encounter for preprocedural cardiovascular examination: Secondary | ICD-10-CM

## 2022-08-29 DIAGNOSIS — E1142 Type 2 diabetes mellitus with diabetic polyneuropathy: Secondary | ICD-10-CM | POA: Insufficient documentation

## 2022-08-29 DIAGNOSIS — I4892 Unspecified atrial flutter: Secondary | ICD-10-CM | POA: Diagnosis not present

## 2022-08-29 DIAGNOSIS — Z01818 Encounter for other preprocedural examination: Secondary | ICD-10-CM | POA: Diagnosis not present

## 2022-08-29 DIAGNOSIS — I483 Typical atrial flutter: Secondary | ICD-10-CM

## 2022-08-29 DIAGNOSIS — Z01812 Encounter for preprocedural laboratory examination: Secondary | ICD-10-CM

## 2022-08-29 LAB — CBC
HCT: 34.4 % — ABNORMAL LOW (ref 36.0–46.0)
Hemoglobin: 10.7 g/dL — ABNORMAL LOW (ref 12.0–15.0)
MCH: 30.3 pg (ref 26.0–34.0)
MCHC: 31.1 g/dL (ref 30.0–36.0)
MCV: 97.5 fL (ref 80.0–100.0)
Platelets: 200 10*3/uL (ref 150–400)
RBC: 3.53 MIL/uL — ABNORMAL LOW (ref 3.87–5.11)
RDW: 18.2 % — ABNORMAL HIGH (ref 11.5–15.5)
WBC: 10.9 10*3/uL — ABNORMAL HIGH (ref 4.0–10.5)
nRBC: 0 % (ref 0.0–0.2)

## 2022-08-29 LAB — BASIC METABOLIC PANEL
Anion gap: 11 (ref 5–15)
BUN: 38 mg/dL — ABNORMAL HIGH (ref 8–23)
CO2: 19 mmol/L — ABNORMAL LOW (ref 22–32)
Calcium: 9 mg/dL (ref 8.9–10.3)
Chloride: 111 mmol/L (ref 98–111)
Creatinine, Ser: 1.26 mg/dL — ABNORMAL HIGH (ref 0.44–1.00)
GFR, Estimated: 45 mL/min — ABNORMAL LOW (ref >60)
Glucose, Bld: 191 mg/dL — ABNORMAL HIGH (ref 70–99)
Potassium: 4.1 mmol/L (ref 3.5–5.1)
Sodium: 141 mmol/L (ref 135–145)

## 2022-08-29 LAB — SURGICAL PCR SCREEN
MRSA, PCR: NEGATIVE
Staphylococcus aureus: NEGATIVE

## 2022-08-29 NOTE — Progress Notes (Signed)
Perioperative Services Pre-Admission/Anesthesia Testing   Date: 08/29/22 Name: Jean Johnson MRN:   [id number]  Re: Consideration of preoperative prophylactic antibiotic change   Request sent to: Schnier, Dolores Lory, MD (routed and/or faxed via [facility])  Planned Surgical Procedure(s):    Case: [id number] Date/Time: 08/30/22 1530   Procedure: BIOPSY TEMPORAL ARTERY (Right)   Anesthesia type: General   Pre-op diagnosis: TEMPORAL ARTERITIS   Location: [place] [facility] / [place]   Surgeons: Katha Cabal, MD    Clinical Notes:  Patient has a documented allergy/intolerance to PCN  Advising that PCN has caused her to experience throat sweeling in the past.   EMR review indicated that patient received PCN and/or cephalosporin in the past as follows: CEFAZOLIN received on 03/24/2021 with no documented ADRs.   Screened as appropriate for cephalosporin use during medication reconciliation No immediate angioedema, dysphagia, SOB, anaphylaxis symptoms. No severe rash involving mucous membranes or skin necrosis. No hospital admissions related to side effects of PCN/cephalosporin use.  No documented reaction to PCN or cephalosporin in the last 10 years.  Request:  As an evidence based approach to reducing the rate of incidence for post-operative SSI and the development of MDROs, could an agent that allows for narrower antimicrobial coverage for preoperative prophylaxis in this patient's upcoming surgical course be considered?   Currently ordered preoperative prophylactic ABX: vancomycin.   Specifically requesting change to cephalosporin (CEFAZOLIN).  Drug of choice for many procedures; it is the most widely studied antimicrobial agent with proven efficacy for antimicrobial prophylaxis.   Desirable duration of action, spectrum of activity against organisms commonly encountered in surgery, and it has an excellent safety profile and low cost.   Active against  streptococci, methicillin-susceptible staphylococci, and many gram-negative organisms.  Please communicate decision with me and I will change the orders in Epic as per your direction.   Things to consider: Many patients report that they were "allergic" to PCN earlier in life, however this does not translate into a true lifelong allergy. Patients can lose sensitivity to specific IgE antibodies over time if PCN is avoided (Kleris & Lugar, 2019).  Up to 10% of the adult population and 15% of hospitalized patients report an allergy to PCN, however clinical studies suggest that 90% of those reporting an allergy can tolerate PCN antibiotics (Kleris & Lugar, 2019).  Cross-sensitivity between PCN and cephalosporins has been documented as being as high as 10%, however this estimation included data believed to have been collected in a setting where there was contamination. Newer data suggests that the prevalence of cross-sensitivity between PCN and cephalosporins is actually estimated to be closer to 1% (Hermanides et al., 2018).   Patients labeled as PCN allergic, whether they are truly allergic or not, have been found to have inferior outcomes in terms of rates of serious infection, and these patients tend to have longer hospital stays ([place], 2019).  Treatment related secondary infections, such as Clostridioides difficile, have been linked to the improper use of broad spectrum antibiotics in patients improperly labeled as PCN allergic (Kleris & Lugar, 2019).  Anaphylaxis from cephalosporins is rare and the evidence suggests that there is no increased risk of an anaphylactic type reaction when cephalosporins are used in a PCN allergic patient (Pichichero, 2006).  Citations: Hermanides J, Lemkes BA, Prins Pearla Dubonnet MW, Terreehorst I. Presumed ?-Lactam Allergy and Cross-reactivity in the Operating Theater: A Practical Approach. Anesthesiology. 2018 Aug;129(2):335-342. doi:  10.1097/ALN.0000000000002252. PMID: [id number].  Kleris,  R. S., & Lugar, P. L. (2019). Things We Do For No Reason: Failing to Question a Penicillin Allergy History. Journal of hospital medicine, 14(10), [phone]. Advance online publication. https://www.wallace-[place]leton.info/  Pichichero, M. E. (2006). Cephalosporins can be prescribed safely for penicillin-allergic patients. Journal of family medicine, 55(2), 106-112. Accessed: https://cdn.mdedge.com/files/s67f-public/Document/September-2017/5502JFP_AppliedEvidence1.pdf   BHonor Loh MSN, APRN, FNP-C, CEN [facility] Peri-operative Services Nurse Practitioner FAX: ([phone]/19/24 12:15 PM

## 2022-08-30 ENCOUNTER — Other Ambulatory Visit: Payer: Self-pay

## 2022-08-30 ENCOUNTER — Encounter
Admission: RE | Disposition: A | Discharge status: Home / Self Care | Payer: Self-pay | Source: Ambulatory Visit | Attending: Vascular Surgery

## 2022-08-30 ENCOUNTER — Encounter: Payer: Self-pay | Admitting: Vascular Surgery

## 2022-08-30 ENCOUNTER — Ambulatory Visit
Admission: RE | Admit: 2022-08-30 | Discharge: 2022-08-30 | Disposition: A | Discharge status: Home / Self Care | Payer: Medicare Other | Source: Ambulatory Visit | Attending: Vascular Surgery | Admitting: Vascular Surgery

## 2022-08-30 ENCOUNTER — Ambulatory Visit: Payer: Medicare Other | Admitting: Urgent Care

## 2022-08-30 DIAGNOSIS — Z79899 Other long term (current) drug therapy: Secondary | ICD-10-CM | POA: Insufficient documentation

## 2022-08-30 DIAGNOSIS — R700 Elevated erythrocyte sedimentation rate: Secondary | ICD-10-CM | POA: Diagnosis not present

## 2022-08-30 DIAGNOSIS — E1140 Type 2 diabetes mellitus with diabetic neuropathy, unspecified: Secondary | ICD-10-CM | POA: Insufficient documentation

## 2022-08-30 DIAGNOSIS — Z940 Kidney transplant status: Secondary | ICD-10-CM | POA: Diagnosis not present

## 2022-08-30 DIAGNOSIS — Z7901 Long term (current) use of anticoagulants: Secondary | ICD-10-CM | POA: Insufficient documentation

## 2022-08-30 DIAGNOSIS — Z880 Allergy status to penicillin: Secondary | ICD-10-CM | POA: Diagnosis not present

## 2022-08-30 DIAGNOSIS — M79662 Pain in left lower leg: Secondary | ICD-10-CM | POA: Insufficient documentation

## 2022-08-30 DIAGNOSIS — I4891 Unspecified atrial fibrillation: Secondary | ICD-10-CM | POA: Insufficient documentation

## 2022-08-30 DIAGNOSIS — H543 Unqualified visual loss, both eyes: Secondary | ICD-10-CM | POA: Insufficient documentation

## 2022-08-30 DIAGNOSIS — Z7984 Long term (current) use of oral hypoglycemic drugs: Secondary | ICD-10-CM | POA: Diagnosis not present

## 2022-08-30 DIAGNOSIS — M7989 Other specified soft tissue disorders: Secondary | ICD-10-CM | POA: Diagnosis not present

## 2022-08-30 DIAGNOSIS — Z794 Long term (current) use of insulin: Secondary | ICD-10-CM | POA: Insufficient documentation

## 2022-08-30 DIAGNOSIS — E1142 Type 2 diabetes mellitus with diabetic polyneuropathy: Secondary | ICD-10-CM

## 2022-08-30 DIAGNOSIS — Z01812 Encounter for preprocedural laboratory examination: Secondary | ICD-10-CM

## 2022-08-30 DIAGNOSIS — H547 Unspecified visual loss: Secondary | ICD-10-CM

## 2022-08-30 DIAGNOSIS — R7982 Elevated C-reactive protein (CRP): Secondary | ICD-10-CM | POA: Insufficient documentation

## 2022-08-30 HISTORY — DX: Endocarditis, valve unspecified: I38

## 2022-08-30 HISTORY — DX: Long term (current) use of anticoagulants: Z79.01

## 2022-08-30 HISTORY — PX: ARTERY BIOPSY: SHX891

## 2022-08-30 LAB — POCT I-STAT, CHEM 8
BUN: 30 mg/dL — ABNORMAL HIGH (ref 8–23)
Calcium, Ion: 1.25 mmol/L (ref 1.15–1.40)
Chloride: 111 mmol/L (ref 98–111)
Creatinine, Ser: 1.3 mg/dL — ABNORMAL HIGH (ref 0.44–1.00)
Glucose, Bld: 160 mg/dL — ABNORMAL HIGH (ref 70–99)
HCT: 33 % — ABNORMAL LOW (ref 36.0–46.0)
Hemoglobin: 11.2 g/dL — ABNORMAL LOW (ref 12.0–15.0)
Potassium: 4 mmol/L (ref 3.5–5.1)
Sodium: 144 mmol/L (ref 135–145)
TCO2: 25 mmol/L (ref 22–32)

## 2022-08-30 LAB — TYPE AND SCREEN
ABO/RH(D): A POS
Antibody Screen: NEGATIVE

## 2022-08-30 LAB — GLUCOSE, CAPILLARY
Glucose-Capillary: 147 mg/dL — ABNORMAL HIGH (ref 70–99)
Glucose-Capillary: 162 mg/dL — ABNORMAL HIGH (ref 70–99)

## 2022-08-30 SURGERY — BIOPSY TEMPORAL ARTERY
Anesthesia: General | Laterality: Right

## 2022-08-30 MED ORDER — ACETAMINOPHEN 10 MG/ML IV SOLN
INTRAVENOUS | Status: AC
  Filled 2022-08-30: qty 100

## 2022-08-30 MED ORDER — ORAL CARE MOUTH RINSE: 15.0000 mL | Freq: Once | OROMUCOSAL | Status: AC

## 2022-08-30 MED ORDER — FAMOTIDINE 20 MG PO TABS: 20.0000 mg | ORAL_TABLET | Freq: Once | ORAL | Status: AC

## 2022-08-30 MED ORDER — PROPOFOL 10 MG/ML IV BOLUS
INTRAVENOUS | Status: DC | PRN
  Administered 2022-08-30: 20 mg via INTRAVENOUS
  Administered 2022-08-30: 50 mg via INTRAVENOUS

## 2022-08-30 MED ORDER — CHLORHEXIDINE GLUCONATE CLOTH 2 % EX PADS: 6.0000 | MEDICATED_PAD | Freq: Once | CUTANEOUS | Status: DC

## 2022-08-30 MED ORDER — FENTANYL CITRATE (PF) 100 MCG/2ML IJ SOLN: 25.0000 ug | INTRAMUSCULAR | Status: DC | PRN

## 2022-08-30 MED ORDER — CEFAZOLIN SODIUM-DEXTROSE 2-4 GM/100ML-% IV SOLN
2.0000 g | Freq: Once | INTRAVENOUS | Status: AC
  Administered 2022-08-30: 2 g via INTRAVENOUS

## 2022-08-30 MED ORDER — BUPIVACAINE HCL (PF) 0.5 % IJ SOLN
INTRAMUSCULAR | Status: DC | PRN
  Administered 2022-08-30: 10 mL

## 2022-08-30 MED ORDER — LIDOCAINE-EPINEPHRINE 1 %-1:100000 IJ SOLN
INTRAMUSCULAR | Status: DC | PRN
  Administered 2022-08-30: 6 mL

## 2022-08-30 MED ORDER — PROPOFOL 10 MG/ML IV BOLUS
INTRAVENOUS | Status: AC
  Filled 2022-08-30: qty 40

## 2022-08-30 MED ORDER — ACETAMINOPHEN 10 MG/ML IV SOLN
INTRAVENOUS | Status: DC | PRN
  Administered 2022-08-30: 500 mg via INTRAVENOUS

## 2022-08-30 MED ORDER — CHLORHEXIDINE GLUCONATE 0.12 % MT SOLN
OROMUCOSAL | Status: AC
  Administered 2022-08-30: 15 mL via OROMUCOSAL
  Filled 2022-08-30: qty 15

## 2022-08-30 MED ORDER — ONDANSETRON HCL 4 MG/2ML IJ SOLN: 4.0000 mg | Freq: Four times a day (QID) | INTRAMUSCULAR | Status: DC | PRN

## 2022-08-30 MED ORDER — LIDOCAINE HCL (PF) 2 % IJ SOLN
INTRAMUSCULAR | Status: DC | PRN
  Administered 2022-08-30: 60 mg via INTRADERMAL

## 2022-08-30 MED ORDER — LIDOCAINE-EPINEPHRINE 1 %-1:100000 IJ SOLN
INTRAMUSCULAR | Status: AC
  Filled 2022-08-30: qty 1

## 2022-08-30 MED ORDER — CEFAZOLIN SODIUM-DEXTROSE 2-4 GM/100ML-% IV SOLN
INTRAVENOUS | Status: AC
  Filled 2022-08-30: qty 100

## 2022-08-30 MED ORDER — SODIUM CHLORIDE 0.9 % IV SOLN: INTRAVENOUS | Status: DC

## 2022-08-30 MED ORDER — BUPIVACAINE HCL (PF) 0.5 % IJ SOLN
INTRAMUSCULAR | Status: AC
  Filled 2022-08-30: qty 30

## 2022-08-30 MED ORDER — FAMOTIDINE 20 MG PO TABS
ORAL_TABLET | ORAL | Status: AC
  Administered 2022-08-30: 20 mg via ORAL
  Filled 2022-08-30: qty 1

## 2022-08-30 MED ORDER — LIDOCAINE HCL (PF) 2 % IJ SOLN
INTRAMUSCULAR | Status: AC
  Filled 2022-08-30: qty 5

## 2022-08-30 MED ORDER — CHLORHEXIDINE GLUCONATE 0.12 % MT SOLN: 15.0000 mL | Freq: Once | OROMUCOSAL | Status: AC

## 2022-08-30 MED ORDER — VANCOMYCIN HCL IN DEXTROSE 1-5 GM/200ML-% IV SOLN: 1000.0000 mg | INTRAVENOUS | Status: DC

## 2022-08-30 MED ORDER — OXYCODONE HCL 5 MG/5ML PO SOLN: 5.0000 mg | Freq: Once | ORAL | Status: DC | PRN

## 2022-08-30 MED ORDER — OXYCODONE HCL 5 MG PO TABS: 5.0000 mg | ORAL_TABLET | Freq: Once | ORAL | Status: DC | PRN

## 2022-08-30 MED ORDER — HYDROMORPHONE HCL 1 MG/ML IJ SOLN: 1.0000 mg | Freq: Once | INTRAMUSCULAR | Status: DC | PRN

## 2022-08-30 MED ORDER — 0.9 % SODIUM CHLORIDE (POUR BTL) OPTIME
TOPICAL | Status: DC | PRN
  Administered 2022-08-30: 500 mL

## 2022-08-30 MED ORDER — PROPOFOL 500 MG/50ML IV EMUL
INTRAVENOUS | Status: DC | PRN
  Administered 2022-08-30: 25 ug/kg/min via INTRAVENOUS

## 2022-08-30 SURGICAL SUPPLY — 40 items
BLADE CLIPPER SURG (BLADE) ×1 IMPLANT
BLADE SURG 15 STRL LF DISP TIS (BLADE) ×1 IMPLANT
BLADE SURG 15 STRL SS (BLADE) ×1
COTTON BALL STERILE (GAUZE/BANDAGES/DRESSINGS) ×1
COTTON BALL STERILE 4 PK (GAUZE/BANDAGES/DRESSINGS) ×1 IMPLANT
DERMABOND ADVANCED .7 DNX12 (GAUZE/BANDAGES/DRESSINGS) ×1 IMPLANT
DRAPE LAPAROTOMY 77X122 PED (DRAPES) ×1 IMPLANT
DRSG TELFA 3X4 N-ADH STERILE (GAUZE/BANDAGES/DRESSINGS) ×1 IMPLANT
ELECT CAUTERY BLADE 6.4 (BLADE) ×1 IMPLANT
ELECT REM PT RETURN 9FT ADLT (ELECTROSURGICAL) ×1
ELECTRODE REM PT RTRN 9FT ADLT (ELECTROSURGICAL) ×1 IMPLANT
GAUZE 4X4 16PLY ~~LOC~~+RFID DBL (SPONGE) ×1 IMPLANT
GLOVE BIO SURGEON STRL SZ7 (GLOVE) ×1 IMPLANT
GLOVE SURG SYN 8.0 (GLOVE) ×1 IMPLANT
GLOVE SURG SYN 8.0 PF PI (GLOVE) ×1 IMPLANT
GOWN STRL REUS W/ TWL LRG LVL3 (GOWN DISPOSABLE) ×2 IMPLANT
GOWN STRL REUS W/ TWL XL LVL3 (GOWN DISPOSABLE) ×1 IMPLANT
GOWN STRL REUS W/TWL LRG LVL3 (GOWN DISPOSABLE) ×2
GOWN STRL REUS W/TWL XL LVL3 (GOWN DISPOSABLE) ×1
LABEL OR SOLS (LABEL) ×1 IMPLANT
MANIFOLD NEPTUNE II (INSTRUMENTS) ×1 IMPLANT
NDL HYPO 25X1 1.5 SAFETY (NEEDLE) ×2 IMPLANT
NEEDLE HYPO 25X1 1.5 SAFETY (NEEDLE) ×2 IMPLANT
NS IRRIG 500ML POUR BTL (IV SOLUTION) ×1 IMPLANT
PACK BASIN MINOR ARMC (MISCELLANEOUS) ×1 IMPLANT
SOL PREP PVP 2OZ (MISCELLANEOUS) ×1
SOLUTION PREP PVP 2OZ (MISCELLANEOUS) ×1 IMPLANT
SUCTION FRAZIER HANDLE 10FR (MISCELLANEOUS) ×1
SUCTION TUBE FRAZIER 10FR DISP (MISCELLANEOUS) ×1 IMPLANT
SUT MNCRL AB 4-0 PS2 18 (SUTURE) ×1 IMPLANT
SUT SILK 3 0 (SUTURE) ×1
SUT SILK 3-0 18XBRD TIE 12 (SUTURE) ×1 IMPLANT
SUT SILK 4 0 (SUTURE) ×1
SUT SILK 4-0 18XBRD TIE 12 (SUTURE) ×1 IMPLANT
SUT VIC AB 3-0 SH 27 (SUTURE) ×1
SUT VIC AB 3-0 SH 27X BRD (SUTURE) ×1 IMPLANT
SYR 10ML LL (SYRINGE) ×2 IMPLANT
SYR BULB IRRIG 60ML STRL (SYRINGE) ×1 IMPLANT
TRAP FLUID SMOKE EVACUATOR (MISCELLANEOUS) ×1 IMPLANT
WATER STERILE IRR 500ML POUR (IV SOLUTION) ×1 IMPLANT

## 2022-08-30 NOTE — Anesthesia Preprocedure Evaluation (Addendum)
Anesthesia Evaluation  Patient identified by MRN, date of birth, ID band Patient awake    Reviewed: Allergy & Precautions, H&P , NPO status , Patient's Chart, lab work & pertinent test results  History of Anesthesia Complications (+) PONVNegative for: history of anesthetic complications  Airway Mallampati: II  TM Distance: >3 FB Neck ROM: full    Dental no notable dental hx. (+) Dental Advidsory Given, Poor Dentition   Pulmonary shortness of breath and with exertion, neg sleep apnea, neg COPD, Not current smoker   Pulmonary exam normal        Cardiovascular hypertension, On Medications (-) angina + dysrhythmias (RBBB) Atrial Fibrillation + Valvular Problems/Murmurs (mild AS noted in 2022) MR   TTE 04/29/2022: mild MR   Neuro/Psych TEMPORAL ARTERITIS  Neuromuscular disease  negative psych ROS   GI/Hepatic negative GI ROS, Neg liver ROS,,,  Endo/Other  diabetes, Type 2, Insulin DependentHypothyroidism    Renal/GU Renal Insufficiency and ESRFRenal diseaseS/p renal transplant 2010  negative genitourinary   Musculoskeletal   Abdominal   Peds  Hematology  (+) Blood dyscrasia, anemia   Anesthesia Other Findings Past Medical History: No date: Anemia of chronic renal disease No date: Aortic stenosis, mild     Comment:  a.) TTE on 11/23/2020 --> mean gradient 9.7 mmHg No date: Atrial fibrillation (HCC)     Comment:  a.) CHA2DS2VASc = 4 (age, sex, HTN, T2DM);  b.)               rate/rhythm maintained on oral diltiazem + flecainide;               chronically anticoagulated with apixaban No date: B12 deficiency No date: Cervical spinal stenosis No date: Diabetic neuropathy (HCC) No date: Diverticulosis No date: Dyspnea No date: ESRD (end stage renal disease) (HCC) No date: First degree AV block No date: Gastrointestinal ulcer No date: HLD (hyperlipidemia) No date: Hypertension No date: Hypothyroidism No date:  Incomplete right bundle branch block (RBBB) No date: LAE (left atrial enlargement)     Comment:  a.) TTE on 11/23/2020 --> moderate No date: Left thyroid nodule     Comment:  a.) cervical MRI on 12/29/2020 --> measured "at least"               3.5 cm; incompletely imaged. 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 b.)              cadaveric organ recipient on 02/04/2009; located in LEFT               lower abdominal quadrant. No date: Valvular regurgitation     Comment:  a.) TTE 11/23/2020 --> mild panvalvular regurgitation;               b.) TTE 04/29/2022: mild MR  Past Surgical History: No date: A/V FISTULAGRAM; Left 03/24/2021: ANTERIOR CERVICAL DECOMP/DISCECTOMY FUSION; N/A     Comment:  Procedure: C3-6 ANTERIOR CERVICAL  DECOMPRESSION/DISCECTOMY FUSION 3 LEVELS;  Surgeon:               Meade Maw, MD;  Location: [facility] ORS;  Service:               Neurosurgery;  Laterality: N/A; 2016: CARPAL TUNNEL RELEASE; Right 03/2020: CATARACT EXTRACTION W/ INTRAOCULAR LENS IMPLANT; Right 10/21/2020: CATARACT EXTRACTION W/PHACO; Left     Comment:  Procedure: CATARACT EXTRACTION PHACO AND INTRAOCULAR               LENS PLACEMENT ([place]) DIABETES LEFT;  Surgeon: Leandrew Koyanagi, MD;  Location: [place];  Service:               Ophthalmology;  Laterality: Left;  2.84 0:48.8 5.8% 09/03/2021: COLONOSCOPY WITH  PROPOFOL; N/A     Comment:  Procedure: COLONOSCOPY WITH PROPOFOL;  Surgeon:               Lesly Rubenstein, MD;  Location: [facility] END[facility];                Service: Endoscopy;  Laterality: N/A;  DM, ELIQUIS,               WHEELCHAIR 2020: EYE SURGERY; Right 12/26/1998: KIDNEY TRANSPLANT; Left     Comment:  Living donor organ recipient (sister) 02/04/2009: KIDNEY TRANSPLANT; Left     Comment:  Cadaveric organ recipient     Reproductive/Obstetrics negative OB ROS                             Anesthesia Physical Anesthesia Plan  ASA: 3  Anesthesia Plan: General   Post-op Pain Management:    Induction: Intravenous  PONV Risk Score and Plan: 4 or greater and Propofol infusion, TIVA, Ondansetron and Dexamethasone  Airway Management Planned: Natural Airway and Nasal Cannula  Additional Equipment:   Intra-op Plan:   Post-operative Plan:   Informed Consent: I have reviewed the patients History and Physical, chart, labs and discussed the procedure including the risks, benefits and alternatives for the proposed anesthesia with the patient or authorized representative who has indicated his/her understanding and acceptance.     Dental Advisory Given  Plan Discussed with: CRNA and Surgeon  Anesthesia Plan Comments: (Patient consented for risks of anesthesia including but not limited to:  - adverse reactions to medications - risk of airway placement if required - damage to eyes, teeth, lips or other oral mucosa - nerve damage due to positioning  - sore throat or hoarseness - Damage to heart, brain, nerves, lungs, other parts of body or loss of life  Patient voiced understanding.)        Anesthesia Quick Evaluation

## 2022-08-30 NOTE — Discharge Instructions (Signed)

## 2022-08-30 NOTE — Anesthesia Postprocedure Evaluation (Signed)
Anesthesia Post Note  Patient: Jean Johnson  Procedure(s) Performed: BIOPSY TEMPORAL ARTERY (Right)  Patient location during evaluation: PACU Anesthesia Type: General Level of consciousness: awake and alert Pain management: pain level controlled Vital Signs Assessment: post-procedure vital signs reviewed and stable Respiratory status: spontaneous breathing, nonlabored ventilation, respiratory function stable and patient connected to nasal cannula oxygen Cardiovascular status: blood pressure returned to baseline and stable Postop Assessment: no apparent nausea or vomiting Anesthetic complications: no  No notable events documented.   Last Vitals:  Vitals:   08/30/22 1727 08/30/22 1731  BP: (!) 148/71 (!) 161/76  Pulse: 69 83  Resp: (!) 21 15  Temp: 37.3 C (!) 36.4 C  SpO2: 99% 100%    Last Pain:  Vitals:   08/30/22 1731  TempSrc: Temporal  PainSc: 0-No pain                 Dimas Millin

## 2022-08-30 NOTE — Op Note (Signed)
        OPERATIVE NOTE   PRE-OPERATIVE DIAGNOSIS: suspected temporal arteritis, visual loss associated with elevated CRP and ESR  POST-OPERATIVE DIAGNOSIS: Same as above  PROCEDURE: 1.   Right temporal artery biopsy  SURGEON: Hortencia Pilar, MD  ASSISTANT(S): None  ANESTHESIA: MAC  ESTIMATED BLOOD LOSS: Minimal  FINDING(S): 1.  none  SPECIMEN(S):  Right  superficial temporal artery sent to pathology  INDICATIONS:   Patient is a 72 y.o. female who presents with visual loss. We were consulted by Dr. Wallace Going for consideration for temporal artery biopsy. Risks and benefits were discussed and he was agreeable to proceed.  DESCRIPTION: After obtaining full informed written consent, the patient was brought back to the operating room and placed supine upon the operating table.  The patient received IV antibiotics prior to induction.  After obtaining adequate anesthesia, the patient was prepped and draped in the standard fashion. The area in front of his right ear was anesthetized copiously with a solution of 1% lidocaine and half percent Marcaine without epinephrine. I then made an incision just in front of the right ear overlying the palpable pulse. I then dissected down through the subcutaneous tissues and identified the superficial temporal artery. This was dissected out over a several centimeters and branches were ligated and divided between silk ties. Care was used to avoid electrocautery around the artery. I then clamped the artery proximally and distally and transected the artery. The specimen was then sent to pathology. The proximal and distal artery were ligated with 3-0 silk ties. Hemostasis was achieved. The wound was then closed with a series of interrupted 3-0 Vicryl's and the skin was closed with a 4-0 Monocryl. Sterile dressing was placed. The patient was taken to the recovery room in stable condition having tolerated the procedure well.  COMPLICATIONS: None  CONDITION:  Stable   Hortencia Pilar 08/30/2022 5:06 PM  This note was created with Dragon Medical transcription system. Any errors in dictation are purely unintentional.

## 2022-08-30 NOTE — Interval H&P Note (Signed)
History and Physical Interval Note:  08/30/2022 1:37 PM  Jean Johnson  has presented today for surgery, with the diagnosis of TEMPORAL ARTERITIS.  The various methods of treatment have been discussed with the patient and family. After consideration of risks, benefits and other options for treatment, the patient has consented to  Procedure(s): BIOPSY TEMPORAL ARTERY (Right) as a surgical intervention.  The patient's history has been reviewed, patient examined, no change in status, stable for surgery.  I have reviewed the patient's chart and labs.  Questions were answered to the patient's satisfaction.     Hortencia Pilar

## 2022-08-30 NOTE — Transfer of Care (Signed)
Immediate Anesthesia Transfer of Care Note  Patient: Jean Johnson  Procedure(s) Performed: BIOPSY TEMPORAL ARTERY (Right)  Patient Location: PACU  Anesthesia Type:General  Level of Consciousness: drowsy and patient cooperative  Airway & Oxygen Therapy: Patient Spontanous Breathing  Post-op Assessment: Report given to RN and Post -op Vital signs reviewed and stable  Post vital signs: Reviewed and stable  Last Vitals:  Vitals Value Taken Time  BP 143/65 08/30/22 1712  Temp 37 C 08/30/22 1710  Pulse 75 08/30/22 1715  Resp 24 08/30/22 1715  SpO2 98 % 08/30/22 1715  Vitals shown include unvalidated device data.  Last Pain:  Vitals:   08/30/22 1710  TempSrc:   PainSc: 0-No pain         Complications: No notable events documented.

## 2022-08-30 NOTE — Progress Notes (Signed)
  Perioperative Services Pre-Admission/Anesthesia Testing     Date: 08/30/22  Name: Jean Johnson MRN:   [id number]  Re: Change in [place] for upcoming surgery   Case: [id number] Date/Time: 08/30/22 1530   Procedure: BIOPSY TEMPORAL ARTERY (Right)   Anesthesia type: General   Pre-op diagnosis: TEMPORAL ARTERITIS   Location: [facility]   Surgeons: Katha Cabal, MD     Primary attending surgeon was consulted regarding consideration of therapeutic change in antimicrobial agent being used for preoperative prophylaxis in this patient's upcoming surgical case. Following analysis of the risk versus benefits, the patient's primary attending surgeon advised that it would be acceptable to discontinue the ordered vancomycin and place an order for cefazolin 2 gm IV on call to the OR. Orders for this patient were amended by me following collaborative conversation with attending surgeon taking into consideration of risk versus benefits associated with the change in therapy.  Honor Loh, MSN, APRN, FNP-C, CEN [facility]  Peri-operative Services Nurse Practitioner Phone: [phone] 08/30/22 8:22 AM

## 2022-08-31 ENCOUNTER — Encounter: Payer: Self-pay | Admitting: Vascular Surgery

## 2022-09-01 LAB — SURGICAL PATHOLOGY

## 2022-09-06 ENCOUNTER — Emergency Department: Payer: Medicare Other

## 2022-09-06 ENCOUNTER — Inpatient Hospital Stay
Admission: EM | Admit: 2022-09-06 | Discharge: 2022-09-24 | DRG: 291 | Disposition: A | Discharge status: Skilled Nursing Facility | Payer: Medicare Other | Attending: Internal Medicine | Admitting: Internal Medicine

## 2022-09-06 ENCOUNTER — Other Ambulatory Visit: Payer: Self-pay

## 2022-09-06 DIAGNOSIS — R7989 Other specified abnormal findings of blood chemistry: Secondary | ICD-10-CM | POA: Diagnosis not present

## 2022-09-06 DIAGNOSIS — I160 Hypertensive urgency: Secondary | ICD-10-CM | POA: Diagnosis present

## 2022-09-06 DIAGNOSIS — Z7901 Long term (current) use of anticoagulants: Secondary | ICD-10-CM

## 2022-09-06 DIAGNOSIS — N186 End stage renal disease: Secondary | ICD-10-CM | POA: Diagnosis present

## 2022-09-06 DIAGNOSIS — I480 Paroxysmal atrial fibrillation: Secondary | ICD-10-CM | POA: Diagnosis present

## 2022-09-06 DIAGNOSIS — Z940 Kidney transplant status: Secondary | ICD-10-CM

## 2022-09-06 DIAGNOSIS — Y830 Surgical operation with transplant of whole organ as the cause of abnormal reaction of the patient, or of later complication, without mention of misadventure at the time of the procedure: Secondary | ICD-10-CM | POA: Diagnosis present

## 2022-09-06 DIAGNOSIS — I132 Hypertensive heart and chronic kidney disease with heart failure and with stage 5 chronic kidney disease, or end stage renal disease: Principal | ICD-10-CM | POA: Diagnosis present

## 2022-09-06 DIAGNOSIS — Z8711 Personal history of peptic ulcer disease: Secondary | ICD-10-CM

## 2022-09-06 DIAGNOSIS — Z79899 Other long term (current) drug therapy: Secondary | ICD-10-CM

## 2022-09-06 DIAGNOSIS — R627 Adult failure to thrive: Secondary | ICD-10-CM | POA: Diagnosis present

## 2022-09-06 DIAGNOSIS — N179 Acute kidney failure, unspecified: Secondary | ICD-10-CM | POA: Diagnosis present

## 2022-09-06 DIAGNOSIS — E43 Unspecified severe protein-calorie malnutrition: Secondary | ICD-10-CM | POA: Insufficient documentation

## 2022-09-06 DIAGNOSIS — I5033 Acute on chronic diastolic (congestive) heart failure: Secondary | ICD-10-CM | POA: Diagnosis not present

## 2022-09-06 DIAGNOSIS — E871 Hypo-osmolality and hyponatremia: Secondary | ICD-10-CM | POA: Diagnosis present

## 2022-09-06 DIAGNOSIS — E1122 Type 2 diabetes mellitus with diabetic chronic kidney disease: Secondary | ICD-10-CM | POA: Diagnosis present

## 2022-09-06 DIAGNOSIS — D631 Anemia in chronic kidney disease: Secondary | ICD-10-CM | POA: Diagnosis present

## 2022-09-06 DIAGNOSIS — I2489 Other forms of acute ischemic heart disease: Secondary | ICD-10-CM | POA: Diagnosis present

## 2022-09-06 DIAGNOSIS — R079 Chest pain, unspecified: Secondary | ICD-10-CM

## 2022-09-06 DIAGNOSIS — L89152 Pressure ulcer of sacral region, stage 2: Secondary | ICD-10-CM | POA: Diagnosis present

## 2022-09-06 DIAGNOSIS — E1140 Type 2 diabetes mellitus with diabetic neuropathy, unspecified: Secondary | ICD-10-CM | POA: Diagnosis present

## 2022-09-06 DIAGNOSIS — R0600 Dyspnea, unspecified: Principal | ICD-10-CM

## 2022-09-06 DIAGNOSIS — E039 Hypothyroidism, unspecified: Secondary | ICD-10-CM | POA: Diagnosis present

## 2022-09-06 DIAGNOSIS — E875 Hyperkalemia: Secondary | ICD-10-CM | POA: Diagnosis present

## 2022-09-06 DIAGNOSIS — Z7401 Bed confinement status: Secondary | ICD-10-CM

## 2022-09-06 DIAGNOSIS — Z794 Long term (current) use of insulin: Secondary | ICD-10-CM

## 2022-09-06 DIAGNOSIS — E785 Hyperlipidemia, unspecified: Secondary | ICD-10-CM | POA: Diagnosis present

## 2022-09-06 DIAGNOSIS — T8619 Other complication of kidney transplant: Secondary | ICD-10-CM | POA: Diagnosis present

## 2022-09-06 DIAGNOSIS — Z7189 Other specified counseling: Secondary | ICD-10-CM | POA: Diagnosis not present

## 2022-09-06 LAB — CBC
HCT: 42.6 % (ref 36.0–46.0)
Hemoglobin: 13.4 g/dL (ref 12.0–15.0)
MCH: 30.5 pg (ref 26.0–34.0)
MCHC: 31.5 g/dL (ref 30.0–36.0)
MCV: 97 fL (ref 80.0–100.0)
Platelets: 138 10*3/uL — ABNORMAL LOW (ref 150–400)
RBC: 4.39 MIL/uL (ref 3.87–5.11)
RDW: 17.5 % — ABNORMAL HIGH (ref 11.5–15.5)
WBC: 10.8 10*3/uL — ABNORMAL HIGH (ref 4.0–10.5)
nRBC: 0 % (ref 0.0–0.2)

## 2022-09-06 LAB — CBG MONITORING, ED
Glucose-Capillary: 90 mg/dL (ref 70–99)
Glucose-Capillary: 83 mg/dL (ref 70–99)

## 2022-09-06 LAB — BASIC METABOLIC PANEL
Anion gap: 7 (ref 5–15)
BUN: 38 mg/dL — ABNORMAL HIGH (ref 8–23)
CO2: 26 mmol/L (ref 22–32)
Calcium: 8.9 mg/dL (ref 8.9–10.3)
Chloride: 109 mmol/L (ref 98–111)
Creatinine, Ser: 1.33 mg/dL — ABNORMAL HIGH (ref 0.44–1.00)
GFR, Estimated: 42 mL/min — ABNORMAL LOW (ref >60)
Glucose, Bld: 96 mg/dL (ref 70–99)
Potassium: 3.6 mmol/L (ref 3.5–5.1)
Sodium: 142 mmol/L (ref 135–145)

## 2022-09-06 LAB — MAGNESIUM: Magnesium: 2.1 mg/dL (ref 1.7–2.4)

## 2022-09-06 LAB — TSH: TSH: 1.749 u[IU]/mL (ref 0.350–4.500)

## 2022-09-06 LAB — TROPONIN I (HIGH SENSITIVITY)
Troponin I (High Sensitivity): 61 ng/L — ABNORMAL HIGH (ref <18)
Troponin I (High Sensitivity): 66 ng/L — ABNORMAL HIGH (ref <18)

## 2022-09-06 LAB — PROCALCITONIN: Procalcitonin: <0.1 ng/mL

## 2022-09-06 LAB — D-DIMER, QUANTITATIVE: D-Dimer, Quant: 1.45 ug/mL-FEU — ABNORMAL HIGH (ref 0.00–0.50)

## 2022-09-06 LAB — BRAIN NATRIURETIC PEPTIDE: B Natriuretic Peptide: 603 pg/mL — ABNORMAL HIGH (ref 0.0–100.0)

## 2022-09-06 MED ORDER — LABETALOL HCL 5 MG/ML IV SOLN: 20.0000 mg | INTRAVENOUS | Status: DC | PRN

## 2022-09-06 MED ORDER — LISINOPRIL 20 MG PO TABS
30.0000 mg | ORAL_TABLET | Freq: Every day | ORAL | Status: DC
  Administered 2022-09-06 – 2022-09-07 (×2): 30 mg via ORAL
  Filled 2022-09-06 (×2): qty 3
  Filled 2022-09-06: qty 1

## 2022-09-06 MED ORDER — INSULIN ASPART 100 UNIT/ML IJ SOLN
0.0000 [IU] | Freq: Three times a day (TID) | INTRAMUSCULAR | Status: DC
  Administered 2022-09-08 – 2022-09-09 (×2): 2 [IU] via SUBCUTANEOUS
  Administered 2022-09-10 – 2022-09-12 (×2): 1 [IU] via SUBCUTANEOUS
  Administered 2022-09-13: 2 [IU] via SUBCUTANEOUS
  Filled 2022-09-06 (×4): qty 1

## 2022-09-06 MED ORDER — INSULIN ASPART 100 UNIT/ML IJ SOLN: 0.0000 [IU] | Freq: Every day | INTRAMUSCULAR | Status: DC

## 2022-09-06 MED ORDER — POTASSIUM CHLORIDE CRYS ER 20 MEQ PO TBCR
20.0000 meq | EXTENDED_RELEASE_TABLET | Freq: Every day | ORAL | Status: DC
  Administered 2022-09-06 – 2022-09-24 (×19): 20 meq via ORAL
  Filled 2022-09-06 (×19): qty 1

## 2022-09-06 MED ORDER — MAGNESIUM HYDROXIDE 400 MG/5ML PO SUSP: 30.0000 mL | Freq: Every day | ORAL | Status: DC | PRN

## 2022-09-06 MED ORDER — PREGABALIN 50 MG PO CAPS
50.0000 mg | ORAL_CAPSULE | Freq: Two times a day (BID) | ORAL | Status: DC
  Administered 2022-09-06 – 2022-09-14 (×16): 50 mg via ORAL
  Filled 2022-09-06 (×16): qty 1

## 2022-09-06 MED ORDER — ACETAMINOPHEN 325 MG PO TABS
650.0000 mg | ORAL_TABLET | Freq: Four times a day (QID) | ORAL | Status: DC | PRN
  Administered 2022-09-06 – 2022-09-24 (×8): 650 mg via ORAL
  Filled 2022-09-06 (×8): qty 2

## 2022-09-06 MED ORDER — EZETIMIBE 10 MG PO TABS
10.0000 mg | ORAL_TABLET | Freq: Every day | ORAL | Status: DC
  Administered 2022-09-06 – 2022-09-24 (×19): 10 mg via ORAL
  Filled 2022-09-06 (×19): qty 1

## 2022-09-06 MED ORDER — MYCOPHENOLATE MOFETIL 250 MG PO CAPS
500.0000 mg | ORAL_CAPSULE | Freq: Two times a day (BID) | ORAL | Status: DC
  Administered 2022-09-06 – 2022-09-24 (×36): 500 mg via ORAL
  Filled 2022-09-06 (×38): qty 2

## 2022-09-06 MED ORDER — MORPHINE SULFATE (PF) 2 MG/ML IV SOLN
2.0000 mg | INTRAVENOUS | Status: DC | PRN
  Administered 2022-09-14 – 2022-09-18 (×3): 2 mg via INTRAVENOUS
  Filled 2022-09-06 (×3): qty 1

## 2022-09-06 MED ORDER — ALENDRONATE SODIUM 70 MG PO TABS: 70.0000 mg | ORAL_TABLET | ORAL | Status: DC

## 2022-09-06 MED ORDER — ASPIRIN 81 MG PO TBEC
81.0000 mg | DELAYED_RELEASE_TABLET | Freq: Every day | ORAL | Status: DC
  Administered 2022-09-07 – 2022-09-12 (×4): 81 mg via ORAL
  Filled 2022-09-06 (×6): qty 1

## 2022-09-06 MED ORDER — FUROSEMIDE 10 MG/ML IJ SOLN
20.0000 mg | Freq: Once | INTRAMUSCULAR | Status: AC
  Administered 2022-09-06: 20 mg via INTRAVENOUS
  Filled 2022-09-06: qty 4

## 2022-09-06 MED ORDER — ONDANSETRON HCL 4 MG PO TABS: 4.0000 mg | ORAL_TABLET | Freq: Four times a day (QID) | ORAL | Status: DC | PRN

## 2022-09-06 MED ORDER — DILTIAZEM HCL ER COATED BEADS 240 MG PO CP24
240.0000 mg | ORAL_CAPSULE | Freq: Every day | ORAL | Status: DC
  Administered 2022-09-06: 240 mg via ORAL
  Filled 2022-09-06: qty 1

## 2022-09-06 MED ORDER — ASPIRIN 81 MG PO CHEW
324.0000 mg | CHEWABLE_TABLET | Freq: Once | ORAL | Status: DC
  Filled 2022-09-06: qty 4

## 2022-09-06 MED ORDER — TACROLIMUS 1 MG PO CAPS
3.0000 mg | ORAL_CAPSULE | Freq: Two times a day (BID) | ORAL | Status: DC
  Administered 2022-09-06 – 2022-09-14 (×16): 3 mg via ORAL
  Filled 2022-09-06 (×16): qty 3

## 2022-09-06 MED ORDER — FLECAINIDE ACETATE 50 MG PO TABS
100.0000 mg | ORAL_TABLET | Freq: Two times a day (BID) | ORAL | Status: DC
  Administered 2022-09-06 – 2022-09-11 (×10): 100 mg via ORAL
  Filled 2022-09-06 (×2): qty 2
  Filled 2022-09-06: qty 1
  Filled 2022-09-06 (×4): qty 2
  Filled 2022-09-06: qty 1
  Filled 2022-09-06 (×2): qty 2

## 2022-09-06 MED ORDER — NITROGLYCERIN 0.4 MG/SPRAY TL SOLN: 1.0000 | Status: DC | PRN

## 2022-09-06 MED ORDER — ACETAMINOPHEN 650 MG RE SUPP
650.0000 mg | Freq: Four times a day (QID) | RECTAL | Status: DC | PRN
  Administered 2022-09-10: 650 mg via RECTAL

## 2022-09-06 MED ORDER — ATORVASTATIN CALCIUM 10 MG PO TABS
10.0000 mg | ORAL_TABLET | Freq: Every day | ORAL | Status: DC
  Administered 2022-09-06 – 2022-09-24 (×19): 10 mg via ORAL
  Filled 2022-09-06 (×19): qty 1

## 2022-09-06 MED ORDER — TRAZODONE HCL 50 MG PO TABS
25.0000 mg | ORAL_TABLET | Freq: Every evening | ORAL | Status: DC | PRN
  Administered 2022-09-07 – 2022-09-18 (×3): 25 mg via ORAL
  Filled 2022-09-06 (×4): qty 1

## 2022-09-06 MED ORDER — INSULIN DETEMIR 100 UNIT/ML ~~LOC~~ SOLN
8.0000 [IU] | Freq: Every day | SUBCUTANEOUS | Status: DC
  Administered 2022-09-08: 8 [IU] via SUBCUTANEOUS
  Filled 2022-09-06 (×4): qty 0.08

## 2022-09-06 MED ORDER — NITROGLYCERIN 0.4 MG SL SUBL: 0.4000 mg | SUBLINGUAL_TABLET | SUBLINGUAL | Status: DC | PRN

## 2022-09-06 MED ORDER — APIXABAN 5 MG PO TABS
5.0000 mg | ORAL_TABLET | Freq: Two times a day (BID) | ORAL | Status: DC
  Administered 2022-09-06 – 2022-09-09 (×6): 5 mg via ORAL
  Filled 2022-09-06 (×6): qty 1

## 2022-09-06 MED ORDER — FUROSEMIDE 10 MG/ML IJ SOLN
40.0000 mg | Freq: Two times a day (BID) | INTRAMUSCULAR | Status: DC
  Administered 2022-09-07 – 2022-09-08 (×3): 40 mg via INTRAVENOUS
  Filled 2022-09-06 (×3): qty 4

## 2022-09-06 MED ORDER — TRAZODONE HCL 50 MG PO TABS: 25.0000 mg | ORAL_TABLET | Freq: Every evening | ORAL | Status: DC | PRN

## 2022-09-06 MED ORDER — ONDANSETRON HCL 4 MG/2ML IJ SOLN
4.0000 mg | Freq: Four times a day (QID) | INTRAMUSCULAR | Status: DC | PRN
  Administered 2022-09-17: 4 mg via INTRAVENOUS
  Filled 2022-09-06: qty 2

## 2022-09-06 NOTE — Assessment & Plan Note (Signed)
-   She is currently in atrial flutter with controlled ventricular response. - We will continue her flecainide, Cardizem CD and Eliquis.

## 2022-09-06 NOTE — Assessment & Plan Note (Addendum)
-   This is in the setting of chest pain and dyspnea. - Chest CTA will be obtained to rule out PE. - Venous Doppler of both lower extremities came back negative for DVT.

## 2022-09-06 NOTE — Assessment & Plan Note (Addendum)
-  The patient will be admitted to a cardiac telemetry bed. - We will continue diuresis with IV Lasix. - We Will follow serial troponins and check BNP as well as procalcitonin. - We will follow I's and O's and daily weights. - Cardiology consult will be obtained. - I notified Dr. Clayborn Bigness about the patient.

## 2022-09-06 NOTE — ED Provider Notes (Signed)
[facility] Provider Note    Event Date/Time   First MD Initiated Contact with Patient 09/06/22 1611     (approximate)   History   Shortness of Breath and Chest Pain   HPI  Jean Johnson is a 72 y.o. female past medical history significant for paroxysmal atrial fibrillation on Eliquis, diabetes, CKD status post renal transplant on immunosuppressant medications, hypertension, hyperlipidemia, who presents to the emergency department with shortness of breath and chest pain.  Patient endorses shortness of breath and chest pain that has been progressively worsening for the past 1 week.  States that she has had progressively worsening lower extremity edema.  States that she goes in and out of atrial fibrillation and does not believe that she is usually in A-fib.  Denies prior history of DVT or PE.  Does endorse compliance with her anticoagulation.  Worsening swelling to her lower legs.  Patient states that she is nearly always bedbound.  Denies any falls or trauma.  States that her shortness of breath is significantly worse when she lays down and feels like she is drowning.  Does still make urine and endorses decreased urine output.  Denies any dysuria.     Physical Exam   Triage Vital Signs: ED Triage Vitals  Enc Vitals Group     BP 09/06/22 1555 (!) 172/91     Pulse Rate 09/06/22 1555 (!) 103     Resp 09/06/22 1555 20     Temp 09/06/22 1555 98.9 F (37.2 C)     Temp Source 09/06/22 1555 Oral     SpO2 09/06/22 1555 99 %     Weight 09/06/22 1554 103 lb (46.7 kg)     Height 09/06/22 1554 '5\' 2"'$  (1.575 m)     Head Circumference --      Peak Flow --      Pain Score 09/06/22 1557 7     Pain Loc --      Pain Edu? --      Excl. in [place]? --     Most recent vital signs: Vitals:   09/06/22 1630 09/06/22 1700  BP: (!) 166/90 (!) 177/80  Pulse: 87 (!) 108  Resp: (!) 25 (!) 28  Temp:    SpO2: 100% 99%    Physical Exam Constitutional:      Appearance:  She is well-developed.  HENT:     Head: Atraumatic.     Comments: Dermabond with laceration to the right scalp, patient states recent biopsy site Eyes:     Conjunctiva/sclera: Conjunctivae normal.  Cardiovascular:     Rate and Rhythm: Tachycardia present. Rhythm irregular.  Pulmonary:     Effort: Respiratory distress present.     Breath sounds: Examination of the right-lower field reveals rales. Examination of the left-lower field reveals rales. Rales present.  Abdominal:     General: There is no distension.  Musculoskeletal:        General: Normal range of motion.     Cervical back: Normal range of motion.     Right lower leg: Edema present.     Left lower leg: Edema present.     Comments: 3+ pitting edema to bilateral lower extremities that is worse in the left lower extremity  Skin:    General: Skin is warm.     Capillary Refill: Capillary refill takes 2 to 3 seconds.  Neurological:     General: No focal deficit present.     Mental Status: She is  alert. Mental status is at baseline.  Psychiatric:        Mood and Affect: Mood normal.     IMPRESSION / MDM / [place] / ED COURSE  I reviewed the triage vital signs and the nursing notes.  On chart review last echocardiogram I can see was 04/2022 that showed a normal EF, noted some diastolic dysfunction.  Small pericardial effusion.  Calcified aortic valve without evidence of aortic stenosis.  Differential diagnosis including atrial fibrillation with a rapid rate, acute on chronic heart failure exacerbation, pneumonia, pulmonary embolism, ACS   EKG  I, Nathaniel Man, the attending physician, personally viewed and interpreted this ECG.   Rate: 102  Rhythm: Atrial flutter  Axis: Normal  Intervals: Normal  ST&T Change: None  Atrial flutter, irregular, tachycardic up to 120 while on cardiac telemetry.  RADIOLOGY I independently reviewed imaging, my interpretation of imaging: CXR with cardiomegaly with pleural  effusions   LABS (all labs ordered are listed, but only abnormal results are displayed) Labs interpreted as -    Labs Reviewed  BASIC METABOLIC PANEL - Abnormal; Notable for the following components:      Result Value   BUN 38 (*)    Creatinine, Ser 1.33 (*)    GFR, Estimated 42 (*)    All other components within normal limits  CBC - Abnormal; Notable for the following components:   WBC 10.8 (*)    RDW 17.5 (*)    Platelets 138 (*)    All other components within normal limits  D-DIMER, QUANTITATIVE - Abnormal; Notable for the following components:   D-Dimer, Quant 1.45 (*)    All other components within normal limits  TROPONIN I (HIGH SENSITIVITY) - Abnormal; Notable for the following components:   Troponin I (High Sensitivity) 61 (*)    All other components within normal limits  TROPONIN I (HIGH SENSITIVITY) - Abnormal; Notable for the following components:   Troponin I (High Sensitivity) 66 (*)    All other components within normal limits  URINALYSIS, ROUTINE W REFLEX MICROSCOPIC  TACROLIMUS LEVEL  BRAIN NATRIURETIC PEPTIDE    TREATMENT  IV Lasix  MDM  Low risk Wells criteria, elevated D-dimer, lower extremity duplex scan without signs of a DVT.  Given her history of CKD and renal transplant ordered a VQ scan.  Patient is currently anticoagulated with her Eliquis.  Patient states that she has history of paroxysmal atrial fibrillation, on the EKGs reviewed in the chart patient has always been in atrial fibrillation or flutter.  Chest x-ray concerning for acute on chronic diastolic heart failure exacerbation.  Reviewed last echocardiogram done 04/2022.  Given IV Lasix in the emergency department.  Consulted hospitalist for admission.  Critical Care performed: No  Procedures  Patient's presentation is most consistent with acute presentation with potential threat to life or bodily function.   MEDICATIONS ORDERED IN ED: Medications  furosemide (LASIX) injection 20  mg (has no administration in time range)    FINAL CLINICAL IMPRESSION(S) / ED DIAGNOSES   Final diagnoses:  Dyspnea, unspecified type  Acute on chronic diastolic heart failure ([place])     Rx / DC Orders   ED Discharge Orders     None        Note:  This document was prepared using Dragon voice recognition software and may include unintentional dictation errors.   Nathaniel Man, MD 09/06/22 [phone]

## 2022-09-06 NOTE — Assessment & Plan Note (Signed)
-   We will continue Prograf and CellCept.

## 2022-09-06 NOTE — ED Triage Notes (Signed)
Pt bib husband c/o shortness of breath and chest pain X 1 week, reports LE edema. Denies history of CHF or COPD.

## 2022-09-06 NOTE — Assessment & Plan Note (Signed)
-   We will continue her Lyrica. ?

## 2022-09-06 NOTE — ED Notes (Addendum)
Care hand off given to [place], Therapist, sports.

## 2022-09-06 NOTE — Assessment & Plan Note (Addendum)
-   We will follow serial troponins. - She will be placed on as needed sublingual nitroglycerin and IV morphine sulfate for pain. - We will check fasting lipids. - We will continue statin therapy. - Cardiology consult will be obtained as mentioned above. - Given elevated D-dimer, will obtain a chest CTA to rule out PE.

## 2022-09-06 NOTE — Assessment & Plan Note (Signed)
-   We will continue statin therapy. 

## 2022-09-06 NOTE — Assessment & Plan Note (Signed)
-   We will place on supplemental coverage with NovoLog and continue basal coverage.

## 2022-09-06 NOTE — Progress Notes (Signed)

## 2022-09-06 NOTE — Assessment & Plan Note (Signed)
-   This could be contributing to #1. - We will continue her antihypertensives and place her on as needed IV labetalol.

## 2022-09-06 NOTE — ED Notes (Signed)
Assumed care of patient from previous RN. Patient is in bed, awake, alert and oriented.  Night time medication ordered reviewed and administered to patient. Patient made aware of orders for CT PE study. Declines to have this study preformed, she is concerned about IV contrast and the effects on her kidneys. [place], NP made aware. Denies further needs.

## 2022-09-06 NOTE — H&P (Signed)
[place]   PATIENT NAME: Jean Johnson    MR#:  [id number]  DATE OF BIRTH:  12-06-1950  DATE OF ADMISSION:  09/06/2022  PRIMARY CARE PHYSICIAN: Gladstone Lighter, MD   Patient is coming from: Home  REQUESTING/REFERRING PHYSICIAN: Nathaniel Man, MD  CHIEF COMPLAINT:   Chief Complaint  Patient presents with   Shortness of Breath   Chest Pain    HISTORY OF PRESENT ILLNESS:  Avona Conlin is a 72 y.o. African-American female with medical history significant for end-stage renal disease s/p renal transplant x2, hypertension, dyslipidemia, hypothyroidism, paroxysmal atrial fibrillation on Eliquis, PUD, type 2 diabetes mellitus with diabetic neuropathy, who presented to the emergency room with acute onset of worsening dyspnea with associated chest pain which has been progressively worse over the last week.  She describes her chest pain as tightness and rates it 10/10 in severity with associated nausea without vomiting or diaphoresis and without radiation.  She has been having associated worsening lower extremity edema without pain as well as orthopnea.  She has been having diminished urine output without dysuria, urinary frequency, urgency or flank pain.  No cough or wheezing or hemoptysis.  She has been having occasional palpitations that she feels is related to her paroxysmal atrial fibrillation.  No bleeding diathesis. . ED Course: When the patient came to the ER, BP was 172/91 and heart rate 103 and heart rate 25 with temperature of 98.9 and pulse oximetry of 99% on room air.  Labs revealed BUN 38 and creatinine 1.33.  High-sensitivity troponin I was 61 and later 66.  CBC revealed WBC of 10.8 and platelets 138.  D-dimer was 1.45 EKG as reviewed by me : EKG showed atrial flutter with variable AV block with a rate of 102, left axis deviation, Q waves anteriorly and T wave inversion laterally. Imaging: Portable chest x-ray showed cardiomegaly with central pulmonary vascular  congestion and small bilateral pleural effusions and patchy opacities in the lung bases that may reflect edema or infection.  The patient was given 4 baby aspirin, 0.4 mg sublingual nitroglycerin and 20 mg of IV Lasix.  She will be admitted to a progressive unit bed for further evaluation and management. PAST MEDICAL HISTORY:   Past Medical History:  Diagnosis Date   Anemia of chronic renal disease    Aortic stenosis, mild    a.) TTE on 11/23/2020 --> mean gradient 9.7 mmHg   Atrial fibrillation ([place])    a.) CHA2DS2VASc = 4 (age, sex, HTN, T2DM);  b.) rate/rhythm maintained on oral diltiazem + flecainide; chronically anticoagulated with apixaban   B12 deficiency    Cervical spinal stenosis    Diabetic neuropathy (HCC)    Diverticulosis    Dyspnea    ESRD (end stage renal disease) (HCC)    First degree AV block    Gastrointestinal ulcer    HLD (hyperlipidemia)    Hypertension    Hypothyroidism    Incomplete right bundle branch block (RBBB)    LAE (left atrial enlargement)    a.) TTE on 11/23/2020 --> moderate   Left thyroid nodule    a.) cervical MRI on 12/29/2020 --> measured "at least" 3.5 cm; incompletely imaged.   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   Valvular regurgitation    a.) TTE 11/23/2020 --> mild panvalvular regurgitation; b.) TTE 04/29/2022: mild MR    PAST SURGICAL HISTORY:   Past Surgical History:  Procedure Laterality Date   A/V FISTULAGRAM  Left    ANTERIOR CERVICAL DECOMP/DISCECTOMY FUSION N/A 03/24/2021   Procedure: C3-6 ANTERIOR CERVICAL DECOMPRESSION/DISCECTOMY FUSION 3 LEVELS;  Surgeon: Meade Maw, MD;  Location: [facility];  Service: Neurosurgery;  Laterality: N/A;   ARTERY BIOPSY Right 08/30/2022   Procedure: BIOPSY TEMPORAL ARTERY;  Surgeon: Katha Cabal, MD;  Location: [facility];  Service: Vascular;  Laterality: Right;   CARPAL TUNNEL RELEASE Right 2016   CATARACT EXTRACTION W/ INTRAOCULAR LENS IMPLANT Right 03/2020   CATARACT EXTRACTION W/PHACO Left 10/21/2020   Procedure: CATARACT EXTRACTION PHACO AND INTRAOCULAR LENS PLACEMENT ([place]) DIABETES LEFT;  Surgeon: Leandrew Koyanagi, MD;  Location: [facility];  Service: Ophthalmology;  Laterality: Left;  2.84 0:48.8 5.8%   COLONOSCOPY WITH PROPOFOL N/A 09/03/2021   Procedure: COLONOSCOPY WITH PROPOFOL;  Surgeon: Lesly Rubenstein, MD;  Location: [facility];  Service: Endoscopy;  Laterality: N/A;  DM, ELIQUIS, WHEELCHAIR   EYE SURGERY Right 2020   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   Living donor organ recipient (sister)   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   Cadaveric organ recipient    SOCIAL HISTORY:   Social History   Tobacco Use   Smoking status: Never   Smokeless tobacco: Never  Substance Use Topics   Alcohol use: Not Currently    FAMILY HISTORY:   Family History  Problem Relation Age of Onset   Diabetes Father    Heart disease Father     DRUG ALLERGIES:   Allergies  Allergen Reactions   E-Mycin [Erythromycin] Swelling    And itching    Penicillins Swelling    Throat Tolerated 1st generation cephalosporin (CEFAZOLIN) on 03/24/2021 without documented ADRs.  No reaction with Ancef on 08/30/2022    REVIEW OF SYSTEMS:   ROS As per history of present illness. All pertinent systems were reviewed above. Constitutional, HEENT, cardiovascular, respiratory, GI, GU, musculoskeletal, neuro, psychiatric, endocrine,  integumentary and hematologic systems were reviewed and are otherwise negative/unremarkable except for positive findings mentioned above in the HPI.   MEDICATIONS AT HOME:   Prior to Admission medications   Medication Sig Start Date End Date Taking? Authorizing Provider  alendronate (FOSAMAX) 70 MG tablet Take 70 mg by mouth once a week. monday    [provider]  apixaban (ELIQUIS) 5 MG TABS tablet Take 5 mg by mouth 2 (two) times daily.    [provider]  atorvastatin (LIPITOR) 10 MG tablet Take 10 mg by mouth daily.    [provider]  diltiazem (CARDIZEM CD) 240 MG 24 hr capsule Take 240 mg by mouth at bedtime.    [provider]  ezetimibe (ZETIA) 10 MG tablet Take 10 mg by mouth daily.    [provider]  flecainide (TAMBOCOR) 100 MG tablet Take 100 mg by mouth every 12 (twelve) hours.    [provider]  furosemide (LASIX) 20 MG tablet Take 20 mg by mouth 2 (two) times daily as needed.    [provider]  insulin  detemir (LEVEMIR) 100 UNIT/ML injection Inject 8 Units into the skin at bedtime.    [provider]  insulin lispro (HUMALOG) 100 UNIT/ML injection Inject 5 Units into the skin 2 (two) times daily.    [provider]  lisinopril (ZESTRIL) 30 MG tablet Take 30 mg by mouth daily.    [provider]  mycophenolate (CELLCEPT) 500 MG tablet Take 500 mg by mouth 2 (two) times daily.    [provider]  Potassium Chloride ER 20 MEQ TBCR Take 1 tablet by mouth daily as needed. 07/29/22   [provider]  predniSONE (DELTASONE) 20 MG tablet Take 20 mg by mouth 3 (three) times daily.    [provider]  pregabalin (LYRICA) 50 MG capsule Take 1 capsule (50 mg total) by mouth 2 (two) times daily. 05/01/22   Emeterio Reeve, DO  tacrolimus (PROGRAF) 1 MG capsule Take 3 mg by mouth 2 (two) times daily.    [provider]  traZODone (DESYREL) 50 MG tablet Take 0.5  tablets (25 mg total) by mouth at bedtime as needed for sleep. 05/01/22   Emeterio Reeve, DO      VITAL SIGNS:  Blood pressure (!) 163/90, pulse (!) 111, temperature 98.9 F (37.2 C), temperature source Oral, resp. rate (!) 28, height '5\' 2"'$  (1.575 m), weight 46.7 kg, SpO2 99 %.  PHYSICAL EXAMINATION:  Physical Exam  GENERAL:  72 y.o.-year-old African-American female patient semi-lying in the bed with mild respiratory distress with conversational dyspnea.Marland Kitchen  EYES: Pupils equal, round, reactive to light and accommodation. No scleral icterus. Extraocular muscles intact.  HEENT: Head atraumatic, normocephalic. Oropharynx and nasopharynx clear.  NECK:  Supple, no jugular venous distention. No thyroid enlargement, no tenderness.  LUNGS: Diminished bibasilar breath sounds with bibasal rales.. No use of accessory muscles of respiration.  CARDIOVASCULAR: Regular rate and rhythm, S1, S2 normal. No murmurs, rubs, or gallops.  ABDOMEN: Soft, nondistended, nontender. Bowel sounds present. No organomegaly or mass.  EXTREMITIES: 3+ pitting bilateral lower extremity edema with no cyanosis, or clubbing.  NEUROLOGIC: Cranial nerves II through XII are intact. Muscle strength 5/5 in all extremities. Sensation intact. Gait not checked.  PSYCHIATRIC: The patient is alert and oriented x 3.  Normal affect and good eye contact. SKIN: No obvious rash, lesion, or ulcer.   LABORATORY PANEL:   CBC Recent Labs  Lab 09/06/22 1603  WBC 10.8*  HGB 13.4  HCT 42.6  PLT 138*   ------------------------------------------------------------------------------------------------------------------  Chemistries  Recent Labs  Lab 09/06/22 1603  NA 142  K 3.6  CL 109  CO2 26  GLUCOSE 96  BUN 38*  CREATININE 1.33*  CALCIUM 8.9   ------------------------------------------------------------------------------------------------------------------  Cardiac Enzymes No results for input(s): "TROPONINI" in the last  168 hours. ------------------------------------------------------------------------------------------------------------------  RADIOLOGY:  US Venous Img Lower Bilateral (DVT)  Result Date: 09/06/2022 CLINICAL DATA:  Leg swelling bilateral EXAM: Bilateral LOWER EXTREMITY VENOUS DOPPLER ULTRASOUND TECHNIQUE: Gray-scale sonography with compression, as well as color and duplex ultrasound, were performed to evaluate the deep venous system(s) from the level of the common femoral vein through the popliteal and proximal calf veins. COMPARISON:  None Available. FINDINGS: VENOUS Normal compressibility of the common femoral, superficial femoral, and popliteal veins, as well as the visualized calf veins. Visualized portions of profunda femoral vein and great saphenous vein unremarkable. No filling defects to suggest DVT on grayscale or color Doppler imaging. Doppler waveforms show normal direction of venous flow, normal respiratory plasticity and response to augmentation. Limited views of  the contralateral common femoral vein are unremarkable. OTHER Scattered soft tissue edema Limitations: none IMPRESSION: No evidence of bilateral lower extremity DVT.  Soft tissue edema Electronically Signed   By: Jill Side M.D.   On: 09/06/2022 18:57   DG Chest Port 1 View  Result Date: 09/06/2022 CLINICAL DATA:  Shortness of breath EXAM: PORTABLE CHEST 1 VIEW COMPARISON:  Chest x-ray 04/29/2022 FINDINGS: Small bilateral pleural effusions are present, left greater than right. There central pulmonary vascular congestion. There are patchy opacities in the lung bases. The heart is enlarged, unchanged. There is no pneumothorax or acute fracture. Cervical spinal fusion plate is present. IMPRESSION: 1. Cardiomegaly with central pulmonary vascular congestion and small bilateral pleural effusions. 2. Patchy opacities in the lung bases may reflect edema or infection. Electronically Signed   By: Ronney Asters M.D.   On: 09/06/2022 17:32       IMPRESSION AND PLAN:  Assessment and Plan: * Acute on chronic diastolic (congestive) heart failure ([place])  -The patient will be admitted to a cardiac telemetry bed. - We will continue diuresis with IV Lasix. - We Will follow serial troponins and check BNP as well as procalcitonin. - We will follow I's and O's and daily weights. - Cardiology consult will be obtained. - I notified Dr. Clayborn Bigness about the patient.   Hypertensive urgency - This could be contributing to #1. - We will continue her antihypertensives and place her on as needed IV labetalol.  Chest pain - We will follow serial troponins. - She will be placed on as needed sublingual nitroglycerin and IV morphine sulfate for pain. - We will check fasting lipids. - We will continue statin therapy. - Cardiology consult will be obtained as mentioned above. - Given elevated D-dimer, will obtain a chest CTA to rule out PE.  Elevated d-dimer - This is in the setting of chest pain and dyspnea. - Chest CTA will be obtained to rule out PE. - Venous Doppler of both lower extremities came back negative for DVT.  Diabetic neuropathy (HCC) - We will continue her Lyrica.  Type 2 diabetes mellitus with chronic kidney disease, with long-term current use of insulin ([place]) - We will place on supplemental coverage with NovoLog and continue basal coverage.  Paroxysmal atrial fibrillation (HCC) - She is currently in atrial flutter with controlled ventricular response. - We will continue her flecainide, Cardizem CD and Eliquis.  Dyslipidemia - We will continue statin therapy.  Renal transplant recipient - We will continue Prograf and CellCept.   DVT prophylaxis: Eliquis. Advanced Care Planning:  Code Status: full code. Family Communication:  The plan of care was discussed in details with the patient (and family). I answered all questions. The patient agreed to proceed with the above mentioned plan. Further management will depend  upon hospital course. Disposition Plan: Back to previous home environment Consults called: Cardiology. All the records are reviewed and case discussed with ED provider.  Status is: Inpatient   At the time of the admission, it appears that the appropriate admission status for this patient is inpatient.  This is judged to be reasonable and necessary in order to provide the required intensity of service to ensure the patient's safety given the presenting symptoms, physical exam findings and initial radiographic and laboratory data in the context of comorbid conditions.  The patient requires inpatient status due to high intensity of service, high risk of further deterioration and high frequency of surveillance required.  I certify that at the time of admission,  it is my clinical judgment that the patient will require inpatient hospital care extending more than 2 midnights.                            Dispo: The patient is from: Home              Anticipated d/c is to: Home              Patient currently is not medically stable to d/c.              Difficult to place patient: No  Christel Mormon M.D on 09/06/2022 at 9:44 PM  [facility]   From 7 PM-7 AM, contact night-coverage [email or web address]  CC: Primary care physician; Gladstone Lighter, MD

## 2022-09-07 ENCOUNTER — Inpatient Hospital Stay: Payer: Medicare Other

## 2022-09-07 DIAGNOSIS — R7989 Other specified abnormal findings of blood chemistry: Secondary | ICD-10-CM | POA: Diagnosis not present

## 2022-09-07 DIAGNOSIS — I160 Hypertensive urgency: Secondary | ICD-10-CM | POA: Diagnosis not present

## 2022-09-07 DIAGNOSIS — R079 Chest pain, unspecified: Secondary | ICD-10-CM | POA: Diagnosis not present

## 2022-09-07 DIAGNOSIS — I5033 Acute on chronic diastolic (congestive) heart failure: Secondary | ICD-10-CM | POA: Diagnosis not present

## 2022-09-07 LAB — URINALYSIS, ROUTINE W REFLEX MICROSCOPIC
Bacteria, UA: NONE SEEN
Bilirubin Urine: NEGATIVE
Glucose, UA: NEGATIVE mg/dL
Ketones, ur: NEGATIVE mg/dL
Leukocytes,Ua: NEGATIVE
Nitrite: NEGATIVE
Protein, ur: >=300 mg/dL — AB
Specific Gravity, Urine: 1.007 (ref 1.005–1.030)
Squamous Epithelial / HPF: NONE SEEN /HPF (ref 0–5)
pH: 5 (ref 5.0–8.0)

## 2022-09-07 LAB — CBG MONITORING, ED
Glucose-Capillary: 73 mg/dL (ref 70–99)
Glucose-Capillary: 77 mg/dL (ref 70–99)
Glucose-Capillary: 127 mg/dL — ABNORMAL HIGH (ref 70–99)
Glucose-Capillary: 104 mg/dL — ABNORMAL HIGH (ref 70–99)

## 2022-09-07 LAB — HEPATIC FUNCTION PANEL
ALT: 8 U/L (ref 0–44)
AST: 13 U/L — ABNORMAL LOW (ref 15–41)
Albumin: 2.1 g/dL — ABNORMAL LOW (ref 3.5–5.0)
Alkaline Phosphatase: 60 U/L (ref 38–126)
Bilirubin, Direct: 0.1 mg/dL (ref 0.0–0.2)
Indirect Bilirubin: 0.7 mg/dL (ref 0.3–0.9)
Total Bilirubin: 0.8 mg/dL (ref 0.3–1.2)
Total Protein: 4.3 g/dL — ABNORMAL LOW (ref 6.5–8.1)

## 2022-09-07 LAB — GLUCOSE, CAPILLARY: Glucose-Capillary: 79 mg/dL (ref 70–99)

## 2022-09-07 LAB — CBC
HCT: 35.6 % — ABNORMAL LOW (ref 36.0–46.0)
Hemoglobin: 11.2 g/dL — ABNORMAL LOW (ref 12.0–15.0)
MCH: 30.8 pg (ref 26.0–34.0)
MCHC: 31.5 g/dL (ref 30.0–36.0)
MCV: 97.8 fL (ref 80.0–100.0)
Platelets: 121 10*3/uL — ABNORMAL LOW (ref 150–400)
RBC: 3.64 MIL/uL — ABNORMAL LOW (ref 3.87–5.11)
RDW: 17.9 % — ABNORMAL HIGH (ref 11.5–15.5)
WBC: 10.4 10*3/uL (ref 4.0–10.5)
nRBC: 0 % (ref 0.0–0.2)

## 2022-09-07 LAB — BASIC METABOLIC PANEL
Anion gap: 6 (ref 5–15)
BUN: 41 mg/dL — ABNORMAL HIGH (ref 8–23)
CO2: 24 mmol/L (ref 22–32)
Calcium: 8.3 mg/dL — ABNORMAL LOW (ref 8.9–10.3)
Chloride: 110 mmol/L (ref 98–111)
Creatinine, Ser: 1.29 mg/dL — ABNORMAL HIGH (ref 0.44–1.00)
GFR, Estimated: 44 mL/min — ABNORMAL LOW (ref >60)
Glucose, Bld: 76 mg/dL (ref 70–99)
Potassium: 3.8 mmol/L (ref 3.5–5.1)
Sodium: 140 mmol/L (ref 135–145)

## 2022-09-07 MED ORDER — IOHEXOL 350 MG/ML SOLN: 60.0000 mL | Freq: Once | INTRAVENOUS | Status: DC | PRN

## 2022-09-07 MED ORDER — DILTIAZEM HCL 30 MG PO TABS
120.0000 mg | ORAL_TABLET | Freq: Two times a day (BID) | ORAL | Status: DC
  Administered 2022-09-07 – 2022-09-09 (×4): 120 mg via ORAL
  Filled 2022-09-07 (×4): qty 4

## 2022-09-07 MED ORDER — TECHNETIUM TO 99M ALBUMIN AGGREGATED
4.0000 | Freq: Once | INTRAVENOUS | Status: AC | PRN
  Administered 2022-09-07: 4.17 via INTRAVENOUS

## 2022-09-07 NOTE — Evaluation (Signed)
Occupational Therapy Evaluation Patient Details Name: Jean Johnson MRN: [id number] DOB: 08-19-1950 Today's Date: 09/07/2022   History of Present Illness Jean Johnson is a 72 y.o. African-American female with medical history significant for end-stage renal disease s/p renal transplant x2, hypertension, dyslipidemia, hypothyroidism, paroxysmal atrial fibrillation on Eliquis, PUD, type 2 diabetes mellitus with diabetic neuropathy, who presented to the emergency room with acute onset of worsening dyspnea with associated chest pain which has been progressively worse over the last week.   Clinical Impression   Chart reviewed, nurse cleared pt for participation in OT evaluation. Co tx completed with PT on this date. Pt is alert and oriented x4, increased time required for processing throughout. PTA pt endorses assist for all ADL/IADL from family, requires assist to transfer from bed to mwc. Pt endorses she feels a little weaker than previous baseline. Pt presents with deficits in strength, endurance, activity tolerance, balance all affecting safe and optimal ADL completion. Pt is unable to sustain static sitting on edge of bed without at least MIN A, MAX A +2 required for all bed mobility. MAX A required for LB dressing. At this time recommend discharge to STR to address functional deficits. OT will continue to follow acutely.      Recommendations for follow up therapy are one component of a multi-disciplinary discharge planning process, led by the attending physician.  Recommendations may be updated based on patient status, additional functional criteria and insurance authorization.   Follow Up Recommendations  Skilled nursing-short term rehab (<3 hours/day) (if pt prefers to dc home would recommend continue [place])     Assistance Recommended at Discharge Frequent or constant Supervision/Assistance  Patient can return home with the following Two people to help with walking and/or transfers;A  lot of help with bathing/dressing/bathroom    Functional Status Assessment  Patient has had a recent decline in their functional status and demonstrates the ability to make significant improvements in function in a reasonable and predictable amount of time.  Equipment Recommendations  None recommended by OT    Recommendations for Other Services       Precautions / Restrictions Precautions Precautions: Fall Restrictions Weight Bearing Restrictions: No      Mobility Bed Mobility Overal bed mobility: Needs Assistance Bed Mobility: Supine to Sit, Sit to Supine     Supine to sit: +2 for physical assistance, Max assist Sit to supine: +2 for physical assistance, Max assist        Transfers                   General transfer comment: pt unable to tolerate seated on edge of bed therefore STS not attempted on this date      Balance Overall balance assessment: Needs assistance Sitting-balance support: Feet unsupported, Bilateral upper extremity supported Sitting balance-Leahy Scale: Poor Sitting balance - Comments: at least MIN A at all times for static sitting with kyphotic posture noted at edge of bed, L lateral lean worsening with fatigue Postural control: Left lateral lean, Posterior lean                                 ADL either performed or assessed with clinical judgement   ADL Overall ADL's : Needs assistance/impaired     Grooming: Minimal assistance           Upper Body Dressing : Moderate assistance Upper Body Dressing Details (indicate cue type and  reason): anticipated Lower Body Dressing: Maximal assistance;Sitting/lateral leans       Toileting- Clothing Manipulation and Hygiene: Total assistance               Vision Patient Visual Report: No change from baseline       Perception     Praxis      Pertinent Vitals/Pain Pain Assessment Pain Assessment: No/denies pain     Hand Dominance Right   Extremity/Trunk  Assessment Upper Extremity Assessment Upper Extremity Assessment: Generalized weakness   Lower Extremity Assessment Lower Extremity Assessment: Generalized weakness   Cervical / Trunk Assessment Cervical / Trunk Assessment: Kyphotic   Communication Communication Communication: No difficulties   Cognition Arousal/Alertness: Awake/alert Behavior During Therapy: WFL for tasks assessed/performed Overall Cognitive Status: No family/caregiver present to determine baseline cognitive functioning Area of Impairment: Problem solving                             Problem Solving: Slow processing       General Comments  vitals monitored, appear stable throughout    Exercises     Shoulder Instructions      Home Living Family/patient expects to be discharged to:: Private residence Living Arrangements: Spouse/significant other Available Help at Discharge: Family;Available 24 hours/day Type of Home: House Home Access: Level entry     Home Layout: Bed/bath upstairs;1/2 bath on main level;Two level Alternate Level Stairs-Number of Steps: 14 Alternate Level Stairs-Rails: Left Bathroom Shower/Tub: Teacher, early years/pre: Standard Bathroom Accessibility: Yes   Home Equipment: Rollator (4 wheels);Rolling Walker (2 wheels);Wheelchair - power;Cane - single point   Additional Comments: information taken from previous admissions      Prior Functioning/Environment Prior Level of Function : Needs assist       Physical Assist : Mobility (physical);ADLs (physical) Mobility (physical): Bed mobility;Transfers ADLs (physical): Bathing;Dressing;Toileting Mobility Comments: pt reports she requries assist for bed mobilty and is lifted out of bed by her husband; household MRADLs via mwc ADLs Comments: assist for all ADLs- reports husband helps her get dressed, has an aid that comes in 1x a week for bathing or she goes to her sisters        OT Problem List: Decreased  strength;Decreased activity tolerance;Impaired balance (sitting and/or standing)      OT Treatment/Interventions: Self-care/ADL training;Balance training;Therapeutic exercise;Therapeutic activities;Energy conservation;DME and/or AE instruction;Patient/family education    OT Goals(Current goals can be found in the care plan section) Acute Rehab OT Goals Patient Stated Goal: go home OT Goal Formulation: With patient Time For Goal Achievement: 09/21/22 Potential to Achieve Goals: Good ADL Goals Pt Will Perform Grooming: with min assist;sitting Pt Will Perform Lower Body Dressing: with mod assist;sitting/lateral leans Pt Will Transfer to Toilet: with min assist Pt Will Perform Toileting - Clothing Manipulation and hygiene: with mod assist  OT Frequency: Min 2X/week    Co-evaluation PT/OT/SLP Co-Evaluation/Treatment: Yes Reason for Co-Treatment: Complexity of the patient's impairments (multi-system involvement);For patient/therapist safety;To address functional/ADL transfers   OT goals addressed during session: ADL's and self-care      AM-PAC OT "6 Clicks" Daily Activity     Outcome Measure Help from another person eating meals?: A Little Help from another person taking care of personal grooming?: A Little Help from another person toileting, which includes using toliet, bedpan, or urinal?: Total Help from another person bathing (including washing, rinsing, drying)?: Total Help from another person to put on and taking off regular upper body  clothing?: A Little Help from another person to put on and taking off regular lower body clothing?: A Lot 6 Click Score: 13   End of Session    Activity Tolerance: Patient tolerated treatment well;Patient limited by fatigue Patient left: in bed;with call bell/phone within reach  OT Visit Diagnosis: Unsteadiness on feet (R26.81);Muscle weakness (generalized) (M62.81)                Time: [id number] OT Time Calculation (min): 9 min Charges:  OT  General Charges $OT Visit: 1 Visit OT Evaluation $OT Eval Low Complexity: 1 Low  Shanon Payor, OTD OTR/L  09/07/22, 4:11 PM

## 2022-09-07 NOTE — Consult Note (Signed)
[place] NOTE       Patient ID: Jean Johnson MRN: [id number] DOB/AGE: 74-06-1951 74 y.o.  Admit date: 09/06/2022 Referring Physician Dr, Eugenie Norrie Primary Physician Dr. Tressia Miners Primary Cardiologist Dr. Clayborn Bigness Reason for Consultation AoCHF  HPI: Jean Johnson is a 72yoF with a PMH of paroxysmal AF (flecainide, Eliquis), HFpEF (60-65%, moderate MR 04/2022), ESRD s/p bilateral renal transplants, DM2 who presented to [facility] ED 09/06/2022 with chest tightness, peripheral edema, and orthopnea.  Cardiology is consulted for assistance with her heart failure.  The patient is followed by Dr. Clayborn Bigness on an outpatient basis for her atrial fibrillation, most recent visit in April 2023. She tells me that she has been having chest tightness for the past 2 weeks as she has a hard time describing other than stating that she feels better when she lays on her left side.  She has had this sensation before years ago when "they did the procedure for her A-fib."  It does not appear that she has ever had a DCCV or ablation and is not familiar with either of these procedures. She is primarily bed bound and has been very weak "for some time now." Admits to worsening peripheral edema and shortness of breath. She is a rather limited historian.   Recent vitals are notable for blood pressure of 109/62, she is atrial flutter on telemetry, rate controlled in the 70s-80s, comfortable on room air Labs are notable for potassium of 3.8, BUN/creatinine improving with diuresis at 41/1.29 and GFR 44.  BNP is elevated at 603, high-sensitivity troponin borderline elevated and flat trending at 61, 66.  Negative procalcitonin at <0.10 H&H 11.2/35.6, platelets 121.  TSH within normal limits at 1.7 chest x-ray with cardiomegaly, central vascular congestion and small bilateral pleural effusions.  Review of systems complete and found to be negative unless listed above     Past Medical History:   Diagnosis Date   Anemia of chronic renal disease    Aortic stenosis, mild    a.) TTE on 11/23/2020 --> mean gradient 9.7 mmHg   Atrial fibrillation ([place])    a.) CHA2DS2VASc = 4 (age, sex, HTN, T2DM);  b.) rate/rhythm maintained on oral diltiazem + flecainide; chronically anticoagulated with apixaban   B12 deficiency    Cervical spinal stenosis    Diabetic neuropathy (HCC)    Diverticulosis    Dyspnea    ESRD (end stage renal disease) (HCC)    First degree AV block    Gastrointestinal ulcer    HLD (hyperlipidemia)    Hypertension    Hypothyroidism    Incomplete right bundle branch block (RBBB)    LAE (left atrial enlargement)    a.) TTE on 11/23/2020 --> moderate   Left thyroid nodule    a.) cervical MRI on 12/29/2020 --> measured "at least" 3.5 cm; incompletely imaged.   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   Valvular regurgitation    a.) TTE  11/23/2020 --> mild panvalvular regurgitation; b.) TTE 04/29/2022: mild MR    Past Surgical History:  Procedure Laterality Date   A/V FISTULAGRAM Left    ANTERIOR CERVICAL DECOMP/DISCECTOMY FUSION N/A 03/24/2021   Procedure: C3-6 ANTERIOR CERVICAL DECOMPRESSION/DISCECTOMY FUSION 3 LEVELS;  Surgeon: Meade Maw, MD;  Location: [facility] ORS;  Service: Neurosurgery;  Laterality: N/A;   ARTERY BIOPSY Right 08/30/2022   Procedure: BIOPSY TEMPORAL ARTERY;  Surgeon: Katha Cabal, MD;  Location: [facility] ORS;  Service:  Vascular;  Laterality: Right;   CARPAL TUNNEL RELEASE Right 2016   CATARACT EXTRACTION W/ INTRAOCULAR LENS IMPLANT Right 03/2020   CATARACT EXTRACTION W/PHACO Left 10/21/2020   Procedure: CATARACT EXTRACTION PHACO AND INTRAOCULAR LENS PLACEMENT ([place]) DIABETES LEFT;  Surgeon: Leandrew Koyanagi, MD;  Location: [facility];  Service: Ophthalmology;  Laterality: Left;  2.84 0:48.8 5.8%   COLONOSCOPY WITH PROPOFOL N/A 09/03/2021   Procedure: COLONOSCOPY WITH PROPOFOL;  Surgeon: Lesly Rubenstein, MD;  Location: [facility] ENDOSCOPY;  Service: Endoscopy;  Laterality: N/A;  DM, ELIQUIS, WHEELCHAIR   EYE SURGERY Right 2020   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   Living donor organ recipient (sister)   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   Cadaveric organ recipient    (Not in a hospital admission)  Social History   Socioeconomic History   Marital status: Married    Spouse name: Francee Piccolo   Number of children: Not on file   Years of education: Not on file   Highest education level: Not on file  Occupational History   Not on file  Tobacco Use   Smoking status: Never   Smokeless tobacco: Never  Vaping Use   Vaping Use: Never used  Substance and Sexual Activity   Alcohol use: Not Currently   Drug use: Never   Sexual activity: Not Currently  Other Topics Concern   Not on file  Social History Narrative   Not on file   Social Determinants of Health   Financial Resource Strain: Not on file  Food Insecurity: No Food Insecurity (04/26/2022)   Hunger Vital Sign    Worried About Running Out of Food in the Last Year: Never true    Ran Out of Food in the Last Year: Never true  Transportation Needs: No Transportation Needs (04/26/2022)   PRAPARE - Hydrologist (Medical): No    Lack of Transportation (Non-Medical): No  Physical Activity: Not on file  Stress: Not on file  Social Connections: Not on file  Intimate Partner Violence: Not At Risk (04/26/2022)    Humiliation, Afraid, Rape, and Kick questionnaire    Fear of Current or Ex-Partner: No    Emotionally Abused: No    Physically Abused: No    Sexually Abused: No    Family History  Problem Relation Age of Onset   Diabetes Father    Heart disease Father      No intake or output data in the 24 hours ending 09/07/22 0834  Vitals:   09/07/22 0600 09/07/22 0615 09/07/22 0700 09/07/22 0800  BP: 128/71  (!) 124/59 109/62  Pulse: 77  78 75  Resp: (!) 26 (!) 24  20  Temp:      TempSrc:      SpO2: 100%  100% 100%  Weight:      Height:        PHYSICAL EXAM General: elderly thin black female, in no acute distress. Laying on her left side in hospital bed, no family present HEENT:  Normocephalic  and atraumatic. Neck:  No JVD.  Lungs: Normal respiratory effort on room air. Decreased breath sounds with basilar crackles.  Heart: irregularly irregular with controlled rate . Normal S1 and S2 without gallops or murmurs.  Abdomen: Non-distended appearing.  Msk: Normal strength and tone for age. Extremities: Warm and well perfused. No clubbing, cyanosis. 2+ bilateral LE edema.  Neuro: Alert and oriented X 3. Psych:  Answers questions appropriately. Limited historian.   Labs: Basic Metabolic Panel: Recent Labs    09/06/22 1603 09/06/22 2042 09/07/22 0316  NA 142  --  140  K 3.6  --  3.8  CL 109  --  110  CO2 26  --  24  GLUCOSE 96  --  76  BUN 38*  --  41*  CREATININE 1.33*  --  1.29*  CALCIUM 8.9  --  8.3*  MG  --  2.1  --    Liver Function Tests: Recent Labs    09/07/22 0316  AST 13*  ALT 8  ALKPHOS 60  BILITOT 0.8  PROT 4.3*  ALBUMIN 2.1*   No results for input(s): "LIPASE", "AMYLASE" in the last 72 hours. CBC: Recent Labs    09/06/22 1603 09/07/22 0316  WBC 10.8* 10.4  HGB 13.4 11.2*  HCT 42.6 35.6*  MCV 97.0 97.8  PLT 138* 121*   Cardiac Enzymes: Recent Labs    09/06/22 1603 09/06/22 1803  TROPONINIHS 61* 66*   BNP: Recent Labs    09/06/22 2000   BNP 603.0*   D-Dimer: Recent Labs    09/06/22 1605  DDIMER 1.45*   Hemoglobin A1C: No results for input(s): "HGBA1C" in the last 72 hours. Fasting Lipid Panel: No results for input(s): "CHOL", "HDL", "LDLCALC", "TRIG", "CHOLHDL", "LDLDIRECT" in the last 72 hours. Thyroid Function Tests: Recent Labs    09/06/22 2042  TSH 1.749   Anemia Panel: No results for input(s): "VITAMINB12", "FOLATE", "FERRITIN", "TIBC", "IRON", "RETICCTPCT" in the last 72 hours.   Radiology: US Venous Img Lower Bilateral (DVT)  Result Date: 09/06/2022 CLINICAL DATA:  Leg swelling bilateral EXAM: Bilateral LOWER EXTREMITY VENOUS DOPPLER ULTRASOUND TECHNIQUE: Gray-scale sonography with compression, as well as color and duplex ultrasound, were performed to evaluate the deep venous system(s) from the level of the common femoral vein through the popliteal and proximal calf veins. COMPARISON:  None Available. FINDINGS: VENOUS Normal compressibility of the common femoral, superficial femoral, and popliteal veins, as well as the visualized calf veins. Visualized portions of profunda femoral vein and great saphenous vein unremarkable. No filling defects to suggest DVT on grayscale or color Doppler imaging. Doppler waveforms show normal direction of venous flow, normal respiratory plasticity and response to augmentation. Limited views of the contralateral common femoral vein are unremarkable. OTHER Scattered soft tissue edema Limitations: none IMPRESSION: No evidence of bilateral lower extremity DVT.  Soft tissue edema Electronically Signed   By: Jill Side M.D.   On: 09/06/2022 18:57   DG Chest Port 1 View  Result Date: 09/06/2022 CLINICAL DATA:  Shortness of breath EXAM: PORTABLE CHEST 1 VIEW COMPARISON:  Chest x-ray 04/29/2022 FINDINGS: Small bilateral pleural effusions are present, left greater than right. There central pulmonary vascular congestion. There are patchy opacities in the lung bases. The heart is enlarged,  unchanged. There is no pneumothorax or acute fracture. Cervical spinal fusion plate is present. IMPRESSION: 1. Cardiomegaly with central pulmonary vascular congestion and small bilateral pleural effusions. 2. Patchy opacities in the lung bases may reflect edema or infection. Electronically Signed  By: Ronney Asters M.D.   On: 09/06/2022 17:32   MR BRAIN WO CONTRAST  Result Date: 08/18/2022 CLINICAL DATA:  Bilateral visual loss. EXAM: MRI HEAD WITHOUT CONTRAST TECHNIQUE: Multiplanar, multiecho pulse sequences of the brain and surrounding structures were obtained without intravenous contrast. COMPARISON:  Head CT 04/14/2022 and MRI 08/06/2021 FINDINGS: Brain: There is no evidence of an acute infarct, mass, midline shift, or extra-axial fluid collection. A few scattered chronic cerebral microhemorrhages are again noted. T2 hyperintensities in the cerebral white matter and pons have mildly progressed from the prior MRI and are nonspecific but compatible with mild chronic small vessel ischemic disease. Mild cerebral atrophy is within normal limits for age. Vascular: Major intracranial vascular flow voids are preserved. Skull and upper cervical spine: Unremarkable bone marrow signal. Prior anterior cervical spine fusion. Sinuses/Orbits: Bilateral cataract extraction. No significant inflammatory changes in the paranasal sinuses. Clear mastoid air cells. Other: None. IMPRESSION: 1. No acute intracranial abnormality. 2. Mild chronic small vessel ischemic disease, mildly progressed from 2023. Electronically Signed   By: Logan Bores M.D.   On: 08/18/2022 07:53    ECHO 04/2022  1. Left ventricular ejection fraction, by estimation, is 60 to 65%. The  left ventricle has normal function. The left ventricle has no regional  wall motion abnormalities. There is severe concentric left ventricular  hypertrophy. Left ventricular diastolic   function could not be evaluated.   2. Right ventricular systolic function is  moderately reduced. The right  ventricular size is mildly enlarged. Severely increased right ventricular  wall thickness.   3. Left atrial size was severely dilated.   4. Right atrial size was severely dilated.   5. A small pericardial effusion is present. The pericardial effusion is  circumferential.   6. The mitral valve is grossly normal. Mild mitral valve regurgitation.   7. The aortic valve is calcified. Aortic valve regurgitation is not  visualized. Aortic valve sclerosis is present, with no evidence of aortic  valve stenosis.   TELEMETRY reviewed by me (LT) 09/07/2022 : AFL 70-80s  EKG reviewed by me: AF rate 107  Data reviewed by me (LT) 09/07/2022: ed note, admission H&P, last 24h vitals tele labs imaging I/O    Principal Problem:   Acute on chronic diastolic (congestive) heart failure (HCC) Active Problems:   Renal transplant recipient   Dyslipidemia   Chest pain   Elevated d-dimer   Paroxysmal atrial fibrillation ([place])   Type 2 diabetes mellitus with chronic kidney disease, with long-term current use of insulin (HCC)   Hypertensive urgency   Diabetic neuropathy ([place])   Acute on chronic diastolic heart failure ([place])    ASSESSMENT AND PLAN:  Jean Johnson is a 46yoF with a PMH of paroxysmal AF (flecainide, Eliquis), HFpEF (60-65%, moderate MR 04/2022), ESRD s/p bilateral renal transplants, DM2 who presented to [facility] ED 09/06/2022 with chest tightness, peripheral edema, and orthopnea.  Cardiology is consulted for assistance with her heart failure.  # atypical chest pain  # Acute on chronic HFpEF Presents with 2 weeks of chest tightness she has a difficult time describing, other than finding relief with laying on her left side.  EKG nonacute although in AF RVR on admission, also clinically hypervolemic with 2+ pitting edema in both lower legs, BNP elevated at 650, despite adherence to home lasix '20mg'$  daily.  -Continue IV diuresis with Lasix 40 mg twice daily -Continue  lisinopril 30 mg daily -Monitor I's/O and a low-salt diet -Echo complete  # Paroxysmal  atrial fibrillation/flutter Currently rate controlled 80s-90s on telemetry, on flecainide 100 mg twice daily, and Cardizem 240 mg nightly.  On Eliquis 5 twice daily for stroke prevention. -Echo complete as above  # S/p renal transplants # CKD 3 Chronically immunosuppressed, still makes urine. Followed by nephrology outpatient. Cr around baseline (1.29 - 1.33)   # Demand ischemia High-sensitivity troponin borderline elevated but flat trending at 61, 66 In the absence of ischemic EKG changes and in the setting of AoCHF this is most consistent with demand/supply mismatch and not ACS  -Continue atorvastatin  This patient's plan of care was discussed and created with Dr. Saralyn Pilar and he is in agreement.  Signed: Tristan Schroeder , PA-C 09/07/2022, 8:34 AM [facility]

## 2022-09-07 NOTE — Discharge Instructions (Signed)

## 2022-09-07 NOTE — ED Notes (Signed)
Patient provided with orange juice

## 2022-09-07 NOTE — Evaluation (Signed)
Physical Therapy Evaluation Patient Details Name: Jean Johnson MRN: [id number] DOB: 18-Feb-1951 Today's Date: 09/07/2022  History of Present Illness  Jean Johnson is a 72 y.o. African-American female with medical history significant for end-stage renal disease s/p renal transplant x2, hypertension, dyslipidemia, hypothyroidism, paroxysmal atrial fibrillation on Eliquis, PUD, type 2 diabetes mellitus with diabetic neuropathy, who presented to the emergency room with acute onset of worsening dyspnea with associated chest pain which has been progressively worse over the last week.   Clinical Impression  Patient received in bed in ED. She is agreeable to PT/OT assessment. Patient requires +2 max assist for bed mobility. She demonstrates poor sitting balance with very weak trunk muscles. She is reliant on B UE support to maintain sitting and fatigues quickly with this. Patient will continue to benefit from skilled PT to improve strength and functional independence to reduce caregiver burden. If husband is able to manage and care for her at home she can return home with HHPT. If he is unable to manage she will benefit from SNF at discharge.         Recommendations for follow up therapy are one component of a multi-disciplinary discharge planning process, led by the attending physician.  Recommendations may be updated based on patient status, additional functional criteria and insurance authorization.  Follow Up Recommendations Home health PT/SNF if husband unable to manage       Assistance Recommended at Discharge Frequent or constant Supervision/Assistance  Patient can return home with the following  A lot of help with walking and/or transfers;A lot of help with bathing/dressing/bathroom;Assistance with cooking/housework    Equipment Recommendations None recommended by PT  Recommendations for Other Services       Functional Status Assessment Patient has had a recent decline in their  functional status and demonstrates the ability to make significant improvements in function in a reasonable and predictable amount of time.     Precautions / Restrictions Precautions Precautions: Fall Restrictions Weight Bearing Restrictions: No      Mobility  Bed Mobility Overal bed mobility: Needs Assistance Bed Mobility: Supine to Sit, Sit to Supine     Supine to sit: +2 for physical assistance, Max assist Sit to supine: +2 for physical assistance, Max assist   General bed mobility comments: patient with poor sitting balance at edge of bed. Very weak trunk    Transfers                   General transfer comment: unable to attempt this session due to fatigue    Ambulation/Gait               General Gait Details: unable to attempt this session  Stairs            Wheelchair Mobility    Modified Rankin (Stroke Patients Only)       Balance Overall balance assessment: Needs assistance Sitting-balance support: Feet unsupported, Bilateral upper extremity supported Sitting balance-Leahy Scale: Poor Sitting balance - Comments: flexed posture, leaning to her left. Requires B UE support to maintain sitting balance. Postural control: Posterior lean, Right lateral lean                                   Pertinent Vitals/Pain Pain Assessment Pain Assessment: No/denies pain    Home Living Family/patient expects to be discharged to:: Private residence Living Arrangements: Spouse/significant other Available Help at Discharge:  Family;Available 24 hours/day Type of Home: House Home Access: Level entry     Alternate Level Stairs-Number of Steps: 14 Home Layout: Bed/bath upstairs;1/2 bath on main level;Two level Home Equipment: Rollator (4 wheels);Rolling Walker (2 wheels);Wheelchair - power;Cane - single point      Prior Function Prior Level of Function : Needs assist       Physical Assist : Mobility (physical) Mobility (physical):  Bed mobility;Transfers   Mobility Comments: patient reports her husband lifts her out of bed and puts her in char. She is not ambulatory at baseline. States she has been working with [place] on this ADLs Comments: assist for IADLs     Hand Dominance   Dominant Hand: Right    Extremity/Trunk Assessment   Upper Extremity Assessment Upper Extremity Assessment: Defer to OT evaluation    Lower Extremity Assessment Lower Extremity Assessment: Generalized weakness    Cervical / Trunk Assessment Cervical / Trunk Assessment: Kyphotic  Communication   Communication: No difficulties  Cognition Arousal/Alertness: Awake/alert Behavior During Therapy: WFL for tasks assessed/performed Overall Cognitive Status: Within Functional Limits for tasks assessed                                          General Comments      Exercises     Assessment/Plan    PT Assessment Patient needs continued PT services  PT Problem List Decreased strength;Decreased activity tolerance;Decreased balance;Decreased mobility       PT Treatment Interventions DME instruction;Therapeutic exercise;Functional mobility training;Therapeutic activities;Patient/family education;Gait training;Balance training    PT Goals (Current goals can be found in the Care Plan section)  Acute Rehab PT Goals Patient Stated Goal: to return home PT Goal Formulation: With patient Time For Goal Achievement: 09/22/22 Potential to Achieve Goals: Fair    Frequency Min 2X/week     Co-evaluation               AM-PAC PT "6 Clicks" Mobility  Outcome Measure Help needed turning from your back to your side while in a flat bed without using bedrails?: A Lot Help needed moving from lying on your back to sitting on the side of a flat bed without using bedrails?: A Lot Help needed moving to and from a bed to a chair (including a wheelchair)?: Total Help needed standing up from a chair using your arms (e.g.,  wheelchair or bedside chair)?: Total Help needed to walk in hospital room?: Total Help needed climbing 3-5 steps with a railing? : Total 6 Click Score: 8    End of Session   Activity Tolerance: Patient limited by fatigue Patient left: in bed;with call bell/phone within reach Nurse Communication: Mobility status PT Visit Diagnosis: Other abnormalities of gait and mobility (R26.89);Muscle weakness (generalized) (M62.81)    Time: [id number] PT Time Calculation (min) (ACUTE ONLY): 10 min   Charges:   PT Evaluation $PT Eval Moderate Complexity: 1 Mod          Chason Mciver, PT, GCS 09/07/22,3:20 PM

## 2022-09-07 NOTE — ED Notes (Signed)
Pt provided with orange juice.

## 2022-09-07 NOTE — ED Notes (Signed)
Pt tx to [facility].

## 2022-09-07 NOTE — Progress Notes (Addendum)
PROGRESS NOTE    Jean Johnson  [id number] DOB: 23-Sep-1950 DOA: 09/06/2022 PCP: Gladstone Lighter, MD   Brief Narrative:  The patient is a 72 year old African-American female with past medical history significant for ESRD s/p renal transplant x 2 now having CKD stage IIIb, hypertension, dyslipidemia, hypothyroidism, PAF on anticoagulation with Eliquis, PUD, type 2 diabetes mellitus with diabetic neuropathy as well as other comorbidities who presented to the ED with acute worsening of her dyspnea associate with chest discomfort that progressively worsened over the last few weeks but significantly worsened after Sunday.  She describes the chest pain as a tightness and rates it a 10 out of 10 in severity and associated with nausea without vomiting or diaphoresis or radiation.  She has had associated worsening lower extremity swelling and edema without pain as well as orthopnea.  She has had diminished urine output without dysuria urinary frequency or flank pain.  Denies any cough or hemoptysis or wheezing.  Has been having occasional palpitations and she feels that it is related to her proximal atrial fibrillation.  Because of her symptoms she presented to the ED and was worked up and EKG showed atrial flutter with a variable AV block chest x-ray showed cardiomegaly with central pulmonary vascular congestion and small bilateral pleural effusions and patchy opacities in lungs that may reflect edema or infection.  Her D-dimer slightly elevated and a CTA of the chest was done to be ordered to evaluate for PE but given her renal function VQ scan was pursued instead and showed low probability for PE.  She was given 4 baby aspirin, 0.4 mg sublingual nitroglycerin as well as 20 g IV Lasix and was admitted for further evaluation of her management of her chest discomfort and as well as acute on chronic diastolic CHF.  Cardiology was consulted and now she is being diuresed with IV Lasix 40 mg twice daily and  echocardiogram is pending.  Assessment and Plan:  * Acute on chronic diastolic (congestive) heart failure ([place]) -The patient will be admitted to a cardiac telemetry bed. -Continue diuresis with IV Lasix and she is now on IV 40 twice daily -Follow serial troponins and check BNP as well as procalcitonin.  Medical BNP was elevated at 650 despite her being adherent to her home medications and she has 2+ lower extremity pitting edema -Strict I's and O's and daily weights  Intake/Output Summary (Last 24 hours) at 09/07/2022 1825 Last data filed at 09/07/2022 1751 Gross per 24 hour  Intake 60 ml  Output --  Net 60 ml  -I's and O's are not accurate -SpO2: 97 % -Cardiology recommends continue lisinopril 30 mg p.o. daily -Continue to monitor for signs and symptoms of volume overload and repeat chest x-ray in a.m.  Hypertensive urgency -This could be contributing to #1. -We will continue her antihypertensives and place her on as needed IV labetalol. -Continue monitor blood pressures per protocol  Atypical chest pain Demand ischemia - We will follow serial troponins and ranging from 61 -> 66. -She will be placed on as needed sublingual nitroglycerin and IV morphine sulfate for pain. -We will check fasting lipids. -Echocardiogram is being obtained - We will continue statin therapy. - Cardiology consult will be obtained as mentioned above. - Given elevated D-dimer, was going to obtain a chest CTA to rule out PE however I canceled this given that we obtained a VQ scan instead given her renal transplant and diuresis  Elevated D-Dimer -This is in the setting of chest  pain and dyspnea. -Chest CTA will be obtained to rule out PE however she is getting diuresed by cardiology so we will hold this and obtain VQ scan and VQ scan showed no findings to suggest pulmonary embolus -Unlikely has a PE but if renal function improves will consider obtaining a CTA of the chest -Venous Doppler of both lower  extremities came back negative for DVT.  Diabetic neuropathy (HCC) -Continue with Pregabalin 50 mg p.o. twice daily  Type 2 diabetes mellitus with chronic kidney disease, with long-term current use of insulin (HCC) -C/w supplemental coverage with sensitive NovoLog sliding scale insulin AC and at bedtime and continue basal coverage with insulin detemir 8 units subcu nightly. -Continue to Monitor CBG's per Protocol; CBGs ranging from 73-127  Paroxysmal atrial fibrillation (HCC)/flutter -She is currently in atrial flutter with controlled ventricular response with heart rates in the 80s to 90s on telemetry -We will continue her Flecainide 100 mg p.o. every 12, Diltiazem CD 120 mg p.o. every 12 and anticoagulation with apixaban 5 mg p.o. twice daily -Continue to monitor on telemetry and further care per cardiology -TSH was 1.749 -Echocardiogram is being obtained  Dyslipidemia -Continue with Atorvastatin 10 mg p.o. daily  History of ESRD s/p Renal transplant recipient x2 now CKD stage IIIb -We will continue Tacrolimus 3 mg p.o. twice daily and Mycophenolate 500 mg p.o. twice daily. -Baseline creatinine is around 1.29-1.33 -BUN/Cr Trend: Recent Labs  Lab 08/29/22 1108 08/30/22 1433 09/06/22 1603 09/07/22 0316  BUN 38* 30* 38* 41*  CREATININE 1.26* 1.30* 1.33* 1.29*  -Patient was scheduled to undergo a CT of the chest PE protocol but I canceled this given her kidney dysfunction and because she had a VQ scan which showed "No finding to suggest pulmonary emboli. Layering pleural effusions, but no sign of arterial distribution hypoperfusion; patient will be continued to be diuresed by cardiology so do not want to worsen her renal function further. -Avoid Nephrotoxic Medications if able, Contrast Dyes, Hypotension and Dehydration to Ensure Adequate Renal Perfusion and will need to Renally Adjust Meds -Continue to Monitor and Trend Renal Function carefully and repeat CMP in the AM   Generalized  Weakness and Fatigue -Obtain PT OT to further evaluate and treat  Normocytic Anemia/Anemia of Chronic Disease  -Hgb/Hct Trend: Recent Labs  Lab 08/29/22 1108 08/30/22 1433 09/06/22 1603 09/07/22 0316  HGB 10.7* 11.2* 13.4 11.2*  HCT 34.4* 33.0* 42.6 35.6*  MCV 97.5  --  97.0 97.8  -Check Anemia Panel in the AM -Continue to Monitor for S/Sx of Bleeding; No overt Bleeding Noted -Repeat CBC in the AM  Hypoalbuminemia -Patient's Albumin Trend: Recent Labs  Lab 09/07/22 0316  ALBUMIN 2.1*  -Continue to Monitor and Trend and repeat CMP in the AM  DVT prophylaxis:  apixaban (ELIQUIS) tablet 5 mg    Code Status: Full Code Family Communication: Discussed with husband at bedside  Disposition Plan:  Level of care: Progressive Status is: Inpatient Remains inpatient appropriate because: Needs further clinical improvement and clearance by cardiology as well as further workup; echocardiogram is still pending   Consultants:  Cardiology  Procedures:  ECHOCARDIOGRAM  Antimicrobials:  Anti-infectives (From admission, onward)    None       Subjective: Seen and examined at bedside and her legs are still very swollen.  Still has some slight dyspnea and states that has not really improved.  Getting diuresed.  States that she is urinating frequently.  No nausea or vomiting.  No lightheadedness or dizziness.  No other concerns or close at this time but states that her shortness of breath has been worsening since last Sunday but started prior to that.  No other concerns or complaints at this time.  Objective: Vitals:   09/07/22 0600 09/07/22 0615 09/07/22 0700 09/07/22 0800  BP: 128/71  (!) 124/59 109/62  Pulse: 77  78 75  Resp: (!) 26 (!) 24  20  Temp:      TempSrc:      SpO2: 100%  100% 100%  Weight:      Height:       No intake or output data in the 24 hours ending 09/07/22 0939 Filed Weights   09/06/22 1554  Weight: 46.7 kg   Examination: Physical  Exam:  Constitutional: Chronically ill-appearing African-American female in no acute distress appears little uncomfortable Respiratory: Diminished to auscultation bilaterally with some coarse breath sounds and does have some slight crackles and rhonchi.  No appreciable wheezing or rales.  Has a normal respiratory effort and is not wearing any supplemental oxygen via nasal cannula or using any accessory muscles to breathe. Cardiovascular: RRR, no murmurs / rubs / gallops. S1 and S2 auscultated.  Has 1-2+ lower extremity pitting edema Abdomen: Soft, non-tender, distended secondary to body habitus bowel sounds positive.  GU: Deferred. Musculoskeletal: No clubbing / cyanosis of digits/nails. No joint deformity upper and lower extremities.  Skin: No rashes, lesions, ulcers on limited skin evaluation. No induration; Warm and dry.  Neurologic: CN 2-12 grossly intact with no focal deficits. Sensation intact in all 4 Extremities, DTR normal. Strength 5/5 in all 4. Romberg sign cerebellar reflexes not assessed.  Psychiatric: Normal judgment and insight. Alert and oriented x 3. Normal mood and appropriate affect.   Data Reviewed: I have personally reviewed following labs and imaging studies  CBC: Recent Labs  Lab 09/06/22 1603 09/07/22 0316  WBC 10.8* 10.4  HGB 13.4 11.2*  HCT 42.6 35.6*  MCV 97.0 97.8  PLT 138* 123XX123*   Basic Metabolic Panel: Recent Labs  Lab 09/06/22 1603 09/06/22 2042 09/07/22 0316  NA 142  --  140  K 3.6  --  3.8  CL 109  --  110  CO2 26  --  24  GLUCOSE 96  --  76  BUN 38*  --  41*  CREATININE 1.33*  --  1.29*  CALCIUM 8.9  --  8.3*  MG  --  2.1  --    GFR: Estimated Creatinine Clearance: 28.6 mL/min (A) (by C-G formula based on SCr of 1.29 mg/dL (H)). Liver Function Tests: Recent Labs  Lab 09/07/22 0316  AST 13*  ALT 8  ALKPHOS 60  BILITOT 0.8  PROT 4.3*  ALBUMIN 2.1*   No results for input(s): "LIPASE", "AMYLASE" in the last 168 hours. No results for  input(s): "AMMONIA" in the last 168 hours. Coagulation Profile: No results for input(s): "INR", "PROTIME" in the last 168 hours. Cardiac Enzymes: No results for input(s): "CKTOTAL", "CKMB", "CKMBINDEX", "TROPONINI" in the last 168 hours. BNP (last 3 results) No results for input(s): "PROBNP" in the last 8760 hours. HbA1C: No results for input(s): "HGBA1C" in the last 72 hours. CBG: Recent Labs  Lab 09/06/22 2017 09/06/22 2315 09/07/22 0315 09/07/22 0725  GLUCAP 90 83 73 77   Lipid Profile: No results for input(s): "CHOL", "HDL", "LDLCALC", "TRIG", "CHOLHDL", "LDLDIRECT" in the last 72 hours. Thyroid Function Tests: Recent Labs    09/06/22 2042  TSH 1.749   Anemia Panel: No results for  input(s): "VITAMINB12", "FOLATE", "FERRITIN", "TIBC", "IRON", "RETICCTPCT" in the last 72 hours. Sepsis Labs: Recent Labs  Lab 09/06/22 2042  PROCALCITON <0.10    Recent Results (from the past 240 hour(s))  Surgical pcr screen     Status: None   Collection Time: 08/29/22 11:08 AM   Specimen: Nasal Mucosa; Nasal Swab  Result Value Ref Range Status   MRSA, PCR NEGATIVE NEGATIVE Final   Staphylococcus aureus NEGATIVE NEGATIVE Final    Comment: (NOTE) The Xpert SA Assay (FDA approved for NASAL specimens in patients 41 years of age and older), is one component of a comprehensive surveillance program. It is not intended to diagnose infection nor to guide or monitor treatment. Performed at [facility], [address], [place], [place] [address]     Radiology Studies: NM Pulmonary Perfusion  Result Date: 09/07/2022 CLINICAL DATA:  Pulmonary embolism suspected, low to intermediate probability. Abnormal D-dimer. EXAM: NUCLEAR MEDICINE PERFUSION LUNG SCAN TECHNIQUE: Perfusion images were obtained in multiple projections after intravenous injection of radiopharmaceutical. Ventilation scans intentionally deferred if perfusion scan and chest x-ray adequate for interpretation during  COVID 19 epidemic. RADIOPHARMACEUTICALS:  4.17 mCi Tc-32mMAA IV COMPARISON:  Chest radiography yesterday FINDINGS: No segmental or subsegmental defects suggestive of embolic disease. Defects related to layering pleural effusions. IMPRESSION: No finding to suggest pulmonary emboli. Layering pleural effusions, but no sign of arterial distribution hypoperfusion. Electronically Signed   By: MNelson ChimesM.D.   On: 09/07/2022 08:59   UK[place]Venous Img Lower Bilateral (DVT)  Result Date: 09/06/2022 CLINICAL DATA:  Leg swelling bilateral EXAM: Bilateral LOWER EXTREMITY VENOUS DOPPLER ULTRASOUND TECHNIQUE: Gray-scale sonography with compression, as well as color and duplex ultrasound, were performed to evaluate the deep venous system(s) from the level of the common femoral vein through the popliteal and proximal calf veins. COMPARISON:  None Available. FINDINGS: VENOUS Normal compressibility of the common femoral, superficial femoral, and popliteal veins, as well as the visualized calf veins. Visualized portions of profunda femoral vein and great saphenous vein unremarkable. No filling defects to suggest DVT on grayscale or color Doppler imaging. Doppler waveforms show normal direction of venous flow, normal respiratory plasticity and response to augmentation. Limited views of the contralateral common femoral vein are unremarkable. OTHER Scattered soft tissue edema Limitations: none IMPRESSION: No evidence of bilateral lower extremity DVT.  Soft tissue edema Electronically Signed   By: AJill SideM.D.   On: 09/06/2022 18:57   DG Chest Port 1 View  Result Date: 09/06/2022 CLINICAL DATA:  Shortness of breath EXAM: PORTABLE CHEST 1 VIEW COMPARISON:  Chest x-ray 04/29/2022 FINDINGS: Small bilateral pleural effusions are present, left greater than right. There central pulmonary vascular congestion. There are patchy opacities in the lung bases. The heart is enlarged, unchanged. There is no pneumothorax or acute fracture.  Cervical spinal fusion plate is present. IMPRESSION: 1. Cardiomegaly with central pulmonary vascular congestion and small bilateral pleural effusions. 2. Patchy opacities in the lung bases may reflect edema or infection. Electronically Signed   By: ARonney AstersM.D.   On: 09/06/2022 17:32    Scheduled Meds:  apixaban  5 mg Oral BID   aspirin  324 mg Oral Once   aspirin EC  81 mg Oral Daily   atorvastatin  10 mg Oral Daily   diltiazem  240 mg Oral QHS   ezetimibe  10 mg Oral Daily   flecainide  100 mg Oral Q12H   furosemide  40 mg Intravenous BID   insulin aspart  0-5 Units Subcutaneous QHS   insulin aspart  0-9 Units Subcutaneous TID WC   insulin detemir  8 Units Subcutaneous QHS   lisinopril  30 mg Oral Daily   mycophenolate  500 mg Oral BID   potassium chloride SA  20 mEq Oral Daily   pregabalin  50 mg Oral BID   tacrolimus  3 mg Oral BID   Continuous Infusions:   LOS: 1 day   Raiford Noble, DO [facility] Available via Epic secure chat 7am-7pm After these hours, please refer to coverage provider listed on [email or web address] 09/07/2022, 9:39 AM

## 2022-09-07 NOTE — ED Notes (Signed)
Pt and son agreeable to CT at this time. CT aware.

## 2022-09-07 NOTE — ED Notes (Signed)
Pt stable at time of departure

## 2022-09-07 NOTE — ED Notes (Signed)
PT/OT at bedside.

## 2022-09-08 ENCOUNTER — Inpatient Hospital Stay: Payer: Medicare Other

## 2022-09-08 ENCOUNTER — Inpatient Hospital Stay
Admit: 2022-09-08 | Discharge: 2022-09-08 | Disposition: A | Discharge status: Home / Self Care | Payer: Medicare Other | Attending: Cardiology | Admitting: Cardiology

## 2022-09-08 DIAGNOSIS — I5033 Acute on chronic diastolic (congestive) heart failure: Secondary | ICD-10-CM | POA: Diagnosis not present

## 2022-09-08 LAB — COMPREHENSIVE METABOLIC PANEL
ALT: 7 U/L (ref 0–44)
AST: 14 U/L — ABNORMAL LOW (ref 15–41)
Albumin: 2 g/dL — ABNORMAL LOW (ref 3.5–5.0)
Alkaline Phosphatase: 57 U/L (ref 38–126)
Anion gap: 9 (ref 5–15)
BUN: 41 mg/dL — ABNORMAL HIGH (ref 8–23)
CO2: 24 mmol/L (ref 22–32)
Calcium: 8.2 mg/dL — ABNORMAL LOW (ref 8.9–10.3)
Chloride: 109 mmol/L (ref 98–111)
Creatinine, Ser: 1.52 mg/dL — ABNORMAL HIGH (ref 0.44–1.00)
GFR, Estimated: 36 mL/min — ABNORMAL LOW (ref >60)
Glucose, Bld: 92 mg/dL (ref 70–99)
Potassium: 3.8 mmol/L (ref 3.5–5.1)
Sodium: 142 mmol/L (ref 135–145)
Total Bilirubin: 0.7 mg/dL (ref 0.3–1.2)
Total Protein: 4.3 g/dL — ABNORMAL LOW (ref 6.5–8.1)

## 2022-09-08 LAB — CBC WITH DIFFERENTIAL/PLATELET
Abs Immature Granulocytes: 0.07 10*3/uL (ref 0.00–0.07)
Basophils Absolute: 0 10*3/uL (ref 0.0–0.1)
Basophils Relative: 0 %
Eosinophils Absolute: 0.1 10*3/uL (ref 0.0–0.5)
Eosinophils Relative: 1 %
HCT: 35.4 % — ABNORMAL LOW (ref 36.0–46.0)
Hemoglobin: 11.1 g/dL — ABNORMAL LOW (ref 12.0–15.0)
Immature Granulocytes: 1 %
Lymphocytes Relative: 8 %
Lymphs Abs: 0.9 10*3/uL (ref 0.7–4.0)
MCH: 30.6 pg (ref 26.0–34.0)
MCHC: 31.4 g/dL (ref 30.0–36.0)
MCV: 97.5 fL (ref 80.0–100.0)
Monocytes Absolute: 0.7 10*3/uL (ref 0.1–1.0)
Monocytes Relative: 6 %
Neutro Abs: 9.6 10*3/uL — ABNORMAL HIGH (ref 1.7–7.7)
Neutrophils Relative %: 84 %
Platelets: 105 10*3/uL — ABNORMAL LOW (ref 150–400)
RBC: 3.63 MIL/uL — ABNORMAL LOW (ref 3.87–5.11)
RDW: 17.8 % — ABNORMAL HIGH (ref 11.5–15.5)
WBC: 11.2 10*3/uL — ABNORMAL HIGH (ref 4.0–10.5)
nRBC: 0 % (ref 0.0–0.2)

## 2022-09-08 LAB — MAGNESIUM: Magnesium: 1.8 mg/dL (ref 1.7–2.4)

## 2022-09-08 LAB — FOLATE: Folate: 9.1 ng/mL (ref >5.9)

## 2022-09-08 LAB — IRON AND TIBC
Iron: 20 ug/dL — ABNORMAL LOW (ref 28–170)
Saturation Ratios: 16 % (ref 10.4–31.8)
TIBC: 129 ug/dL — ABNORMAL LOW (ref 250–450)
UIBC: 109 ug/dL

## 2022-09-08 LAB — PHOSPHORUS: Phosphorus: 3.7 mg/dL (ref 2.5–4.6)

## 2022-09-08 LAB — GLUCOSE, CAPILLARY
Glucose-Capillary: 88 mg/dL (ref 70–99)
Glucose-Capillary: 175 mg/dL — ABNORMAL HIGH (ref 70–99)
Glucose-Capillary: 95 mg/dL (ref 70–99)
Glucose-Capillary: 107 mg/dL — ABNORMAL HIGH (ref 70–99)

## 2022-09-08 LAB — RETICULOCYTES
Immature Retic Fract: 6.4 % (ref 2.3–15.9)
RBC.: 3.62 MIL/uL — ABNORMAL LOW (ref 3.87–5.11)
Retic Count, Absolute: 34.4 10*3/uL (ref 19.0–186.0)
Retic Ct Pct: 1 % (ref 0.4–3.1)

## 2022-09-08 LAB — VITAMIN B12: Vitamin B-12: 186 pg/mL (ref 180–914)

## 2022-09-08 LAB — FERRITIN: Ferritin: 905 ng/mL — ABNORMAL HIGH (ref 11–307)

## 2022-09-08 MED ORDER — FUROSEMIDE 10 MG/ML IJ SOLN
6.0000 mg/h | INTRAVENOUS | Status: DC
  Administered 2022-09-08: 4 mg/h via INTRAVENOUS
  Administered 2022-09-09: 8 mg/h via INTRAVENOUS
  Administered 2022-09-10 – 2022-09-12 (×2): 6 mg/h via INTRAVENOUS
  Filled 2022-09-08 (×4): qty 20

## 2022-09-08 NOTE — Plan of Care (Signed)
  Problem: Education: Goal: Ability to demonstrate management of disease process will improve Outcome: Progressing Goal: Ability to verbalize understanding of medication therapies will improve Outcome: Progressing   Problem: Coping: Goal: Ability to adjust to condition or change in health will improve Outcome: Progressing   Problem: Fluid Volume: Goal: Ability to maintain a balanced intake and output will improve Outcome: Progressing

## 2022-09-08 NOTE — TOC Initial Note (Signed)
Transition of Care [facility]) - Initial/Assessment Note    Patient Details  Name: Jean Johnson MRN: [id number] Date of Birth: [birth year]  [facility]) CM/SW Contact:    Laurena Slimmer, RN Phone Number: 09/08/2022, 4:26 PM  Clinical Narrative:                 Admitted for:CHF Admitted from:Home  [id number], [place] [place]: Spouse  Patient is currently active with [facility] If skilled is needed she is agreeable.    Expected Discharge Plan: [place] Barriers to Discharge: Continued Medical Work up   Patient Goals and CMS Choice Patient states their goals for this hospitalization and ongoing recovery are:: Home          Expected Discharge Plan and Services       Living arrangements for the past 2 months: [place]: [place] ([place]) Date [place]: 09/08/22 Time [place]: 1625 Representative spoke with at [place]: [place] Arrangements/Services Living arrangements for the past 2 months: [place] Lives with:: Spouse Patient language and need for interpreter reviewed:: Yes Do you feel safe going back to the place where you live?: Yes      Need for Family Participation in Patient Care: Yes (Comment) Care giver support system in place?: Yes (comment) Current home services: Home PT ([facility]) Criminal Activity/Legal Involvement Pertinent to Current Situation/Hospitalization: No - Comment as needed  Activities of Daily Living Home Assistive Devices/Equipment: Eyeglasses, Wheelchair ADL Screening (condition at time of admission) Patient's cognitive ability adequate to safely complete daily activities?: Yes Is the patient deaf or have difficulty hearing?: No Does the patient have difficulty seeing, even when wearing glasses/contacts?: Yes Does the patient have difficulty concentrating,  remembering, or making decisions?: No Patient able to express need for assistance with ADLs?: Yes Does the patient have difficulty dressing or bathing?: Yes Independently performs ADLs?: No Communication: Independent Dressing (OT): Independent Grooming: Independent Feeding: Independent Bathing: Needs assistance Is this a change from baseline?: Pre-admission baseline Toileting: Needs assistance Is this a change from baseline?: Pre-admission baseline In/Out Bed: Needs assistance Is this a change from baseline?: Pre-admission baseline Walks in Home: Dependent Is this a change from baseline?: Pre-admission baseline Does the patient have difficulty walking or climbing stairs?: Yes Weakness of Legs: Both Weakness of Arms/Hands: None  Permission Sought/Granted                  Emotional Assessment Appearance:: Appears older than stated age Attitude/Demeanor/Rapport: Gracious, Engaged Affect (typically observed): Accepting Orientation: : Oriented to Self, Oriented to Place, Oriented to  Time, Oriented to Situation Alcohol / Substance Use: Not Applicable Psych Involvement: Yes (comment)  Admission diagnosis:  Acute on chronic diastolic heart failure (HCC) [I50.33] Acute on chronic diastolic (congestive) heart failure (HCC) [I50.33] Dyspnea, unspecified type [R06.00] Patient Active Problem List   Diagnosis Date Noted   Acute on chronic diastolic (congestive) heart failure (HCC) 09/06/2022   Chest pain 09/06/2022   Elevated d-dimer 09/06/2022   Paroxysmal atrial fibrillation ([place]) 09/06/2022   Type 2 diabetes mellitus with chronic kidney disease, with long-term current use of insulin ([place]) 09/06/2022   Hypertensive urgency 09/06/2022   Diabetic neuropathy ([place]) 09/06/2022   Acute on chronic diastolic heart failure ([place]) 09/06/2022  Pain and swelling of left lower leg 08/28/2022   Visual changes 08/24/2022   Headache 08/24/2022   Pleural effusion due to CHF (congestive heart  failure) ([place]) 04/30/2022   Acute heart failure with preserved ejection fraction (HFpEF) ([place]) 04/30/2022   Pressure injury of skin 04/27/2022   AKI (acute kidney injury) ([place]) on CKD 3b/4 04/27/2022   Intractable nausea and vomiting 04/25/2022   Atrial fibrillation with rapid ventricular response ([place]) 04/25/2022   Type 2 diabetes mellitus with peripheral neuropathy ([place]) 04/25/2022   Renal transplant recipient 04/25/2022   Dyslipidemia 04/25/2022   Generalized weakness    Acute on chronic diastolic CHF (congestive heart failure) ([place]) 99991111   Metabolic acidosis 99991111   Acute urinary retention 04/18/2022   Syncope 04/14/2022   Atrial fibrillation, chronic ([place]) 04/14/2022   Type II diabetes mellitus with renal manifestations ([place]) 04/14/2022   HLD (hyperlipidemia)    Hypokalemia    Acute bronchitis due to COVID-19 virus    Acute renal failure superimposed on stage 3a chronic kidney disease ([place])    Myocardial injury    Hypoglycemia    Cervical myelopathy ([place]) 03/24/2021   Anemia 12/03/2020   Essential hypertension 12/03/2020   Hypothyroidism 12/03/2020   Type 2 diabetes mellitus ([place]) 12/03/2020   Atrial fibrillation ([place]) 09/21/2018   Kidney transplant status 09/21/2018   Osteoporosis 09/21/2018   PCP:  Gladstone Lighter, MD [id number] [place] [place]Phone: [phone]: [phone]    Social Determinants of Health (SDOH) Social History: SDOH Screenings   Food Insecurity: No Food Insecurity (09/07/2022)  Housing: Low Risk  (09/07/2022)  Transportation Needs: No Transportation Needs (09/07/2022)  Utilities: Not At Risk (09/07/2022)  Tobacco Use: Low Risk  (08/31/2022)   SDOH Interventions:     Readmission Risk Interventions    09/08/2022    4:23 PM 04/27/2022    3:34 PM  Readmission Risk Prevention Plan  Transportation Screening Complete Complete  HRI or Home Care Consult   Complete  Social Work Consult for [place]/Counseling  Complete  Palliative Care Screening  Not Applicable  Medication Review (Press photographer Complete Complete  PCP or Specialist appointment within 3-5 days of discharge Complete   HRI or [place]   SW Recovery Care/Counseling Consult Complete   Palliative Care Screening Not Complete   Skilled Nursing Facility Complete

## 2022-09-08 NOTE — Progress Notes (Signed)
*  PRELIMINARY RESULTS* Echocardiogram 2D Echocardiogram has been performed.  Sherrie Sport 09/08/2022, 2:12 PM

## 2022-09-08 NOTE — Progress Notes (Signed)
PROGRESS NOTE    Jean Johnson  [id number] DOB: 06/30/1951 DOA: 09/06/2022 PCP: Gladstone Lighter, MD    Brief Narrative:  72 year old African-American female with past medical history significant for ESRD s/p renal transplant x 2 now having CKD stage IIIb, hypertension, dyslipidemia, hypothyroidism, PAF on anticoagulation with Eliquis, PUD, type 2 diabetes mellitus with diabetic neuropathy as well as other comorbidities who presented to the ED with acute worsening of her dyspnea associate with chest discomfort that progressively worsened over the last few weeks but significantly worsened after Sunday.  She describes the chest pain as a tightness and rates it a 10 out of 10 in severity and associated with nausea without vomiting or diaphoresis or radiation.  She has had associated worsening lower extremity swelling and edema without pain as well as orthopnea.  She has had diminished urine output without dysuria urinary frequency or flank pain.  Denies any cough or hemoptysis or wheezing.  Has been having occasional palpitations and she feels that it is related to her proximal atrial fibrillation.  Because of her symptoms she presented to the ED and was worked up and EKG showed atrial flutter with a variable AV block chest x-ray showed cardiomegaly with central pulmonary vascular congestion and small bilateral pleural effusions and patchy opacities in lungs that may reflect edema or infection.  Her D-dimer slightly elevated and a CTA of the chest was done to be ordered to evaluate for PE but given her renal function VQ scan was pursued instead and showed low probability for PE.  She was given 4 baby aspirin, 0.4 mg sublingual nitroglycerin as well as 20 g IV Lasix and was admitted for further evaluation of her management of her chest discomfort and as well as acute on chronic diastolic CHF.  Cardiology was consulted and now she is being diuresed with IV Lasix 40 mg twice daily   Pulm/29:  Discussed case with cardiology.  Considering uptrending creatinine and tenuous renal function will convert to Lasix gtt.   Assessment & Plan:   Principal Problem:   Acute on chronic diastolic (congestive) heart failure (HCC) Active Problems:   Hypertensive urgency   Chest pain   Elevated d-dimer   Renal transplant recipient   Dyslipidemia   Paroxysmal atrial fibrillation (HCC)   Type 2 diabetes mellitus with chronic kidney disease, with long-term current use of insulin (HCC)   Diabetic neuropathy (HCC)   Acute on chronic diastolic heart failure (HCC)  Acute on chronic diastolic congestive heart failure Presents with 2 weeks of chest tightness with only relieved by lying on her left side.   BNP elevated despite adherence to home Lasix 20 mg daily Remains clinically volume overloaded Plan: Lasix gtt. 4 mg/h Stop ACE inhibitor Strict I's and O's, low-salt diet, daily weights Echo  Hypertensive urgency Blood pressure improved control.  On Lasix gtt. as above  Paroxysmal atrial fibrillation with rapid ventricular response Ventricular rate have improved.  Currently 60s to 90s on telemetry. Plan: Continue Eliquis 5 mg twice daily Cardizem 120 mg twice daily Flecainide 100 mg twice daily 2D echo as above  ESRD s/p renal transplant x 2 Chronic immunosuppression Still makes urine.  Followed by nephrology outpatient.  Baseline creatinine 1.29-1.33 Plan: Continue immunosuppressive regimen of CellCept and tacrolimus Check Prograf level Nephrology consulted, case discussed with Dr. Juleen China.  They will evaluate patient 3/1  Elevated troponin Dyslipidemia Borderline elevation.  Flat trend Not indicative of ACS Continue PTA statin  Diabetic neuropathy (HCC) Continue with Pregabalin 50  mg p.o. twice daily   Type 2 diabetes mellitus with chronic kidney disease, with long-term current use of insulin (HCC) -C/w supplemental coverage with sensitive NovoLog sliding scale insulin AC  and at bedtime and continue basal coverage with insulin detemir 8 units subcu nightly. -Continue to Monitor CBG's per Protocol; CBGs ranging from 73-127    DVT prophylaxis: Eliquis Code Status: Full code Family Communication: None today Disposition Plan: Status is: Inpatient Remains inpatient appropriate because: Decompensated heart failure on IV diuresis   Level of care: Progressive  Consultants:  Cardiology- [place] [facility]- Dr. Juleen China  Procedures:  None  Antimicrobials: None    Subjective: Seen and examined.  No marked status changes overnight.  Continues to feel that her legs are very swollen.  Complains of frequent urination.  Objective: Vitals:   09/08/22 0341 09/08/22 0500 09/08/22 0832 09/08/22 1213  BP: (!) 144/54  139/67 (!) 105/50  Pulse: (!) 58  68 76  Resp:   16 16  Temp: 97.8 F (36.6 C)  97.7 F (36.5 C) 98.4 F (36.9 C)  TempSrc: Oral  Oral Oral  SpO2: 100%  92% 97%  Weight:  50.8 kg    Height:        Intake/Output Summary (Last 24 hours) at 09/08/2022 1432 Last data filed at 09/08/2022 1311 Gross per 24 hour  Intake 300 ml  Output 1150 ml  Net -850 ml   Filed Weights   09/06/22 1554 09/08/22 0500  Weight: 46.7 kg 50.8 kg    Examination:  General exam: NAD.  Appears frail and chronically Respiratory system: Diminished breath sounds bilaterally.  Bibasilar crackles.  Normal work of breathing.  Room air Cardiovascular system: S1-S2, regular rate, irregular rhythm, no murmurs, 1+ pitting edema BLE Gastrointestinal system: Soft, NT/ND, normal bowel sounds Central nervous system: Alert and oriented. No focal neurological deficits. Extremities: Symmetric 5 x 5 power. Skin: No rashes, lesions or ulcers Psychiatry: Judgement and insight appear normal. Mood & affect appropriate.     Data Reviewed: I have personally reviewed following labs and imaging studies  CBC: Recent Labs  Lab 09/06/22 1603 09/07/22 0316 09/08/22 0541   WBC 10.8* 10.4 11.2*  NEUTROABS  --   --  9.6*  HGB 13.4 11.2* 11.1*  HCT 42.6 35.6* 35.4*  MCV 97.0 97.8 97.5  PLT 138* 121* 123456*   Basic Metabolic Panel: Recent Labs  Lab 09/06/22 1603 09/06/22 2042 09/07/22 0316 09/08/22 0541  NA 142  --  140 142  K 3.6  --  3.8 3.8  CL 109  --  110 109  CO2 26  --  24 24  GLUCOSE 96  --  76 92  BUN 38*  --  41* 41*  CREATININE 1.33*  --  1.29* 1.52*  CALCIUM 8.9  --  8.3* 8.2*  MG  --  2.1  --  1.8  PHOS  --   --   --  3.7   GFR: Estimated Creatinine Clearance: 26.1 mL/min (A) (by C-G formula based on SCr of 1.52 mg/dL (H)). Liver Function Tests: Recent Labs  Lab 09/07/22 0316 09/08/22 0541  AST 13* 14*  ALT 8 7  ALKPHOS 60 57  BILITOT 0.8 0.7  PROT 4.3* 4.3*  ALBUMIN 2.1* 2.0*   No results for input(s): "LIPASE", "AMYLASE" in the last 168 hours. No results for input(s): "AMMONIA" in the last 168 hours. Coagulation Profile: No results for input(s): "INR", "PROTIME" in the last 168 hours. Cardiac Enzymes: No results for  input(s): "CKTOTAL", "CKMB", "CKMBINDEX", "TROPONINI" in the last 168 hours. BNP (last 3 results) No results for input(s): "PROBNP" in the last 8760 hours. HbA1C: No results for input(s): "HGBA1C" in the last 72 hours. CBG: Recent Labs  Lab 09/07/22 1139 09/07/22 1621 09/07/22 1959 09/08/22 0837 09/08/22 1211  GLUCAP 127* 104* 79 88 175*   Lipid Profile: No results for input(s): "CHOL", "HDL", "LDLCALC", "TRIG", "CHOLHDL", "LDLDIRECT" in the last 72 hours. Thyroid Function Tests: Recent Labs    09/06/22 2042  TSH 1.749   Anemia Panel: Recent Labs    09/08/22 0541  VITAMINB12 186  FOLATE 9.1  FERRITIN 905*  TIBC 129*  IRON 20*  RETICCTPCT 1.0   Sepsis Labs: Recent Labs  Lab 09/06/22 2042  PROCALCITON <0.10    No results found for this or any previous visit (from the past 240 hour(s)).       Radiology Studies: DG Chest Port 1 View  Result Date: 09/08/2022 CLINICAL DATA:   Acute on chronic congestive heart failure. EXAM: PORTABLE CHEST 1 VIEW COMPARISON:  09/06/22 FINDINGS: Stable cardiomediastinal contours. Increased volume of left pleural effusion. Now moderate to large. Small right pleural effusion is improved in the interval. Similar appearance of mild diffuse interstitial edema. IMPRESSION: 1. Increased volume of left pleural effusion. 2. Improved right pleural effusion. 3. Persistent mild interstitial edema. Electronically Signed   By: Kerby Moors M.D.   On: 09/08/2022 08:16   NM Pulmonary Perfusion  Result Date: 09/07/2022 CLINICAL DATA:  Pulmonary embolism suspected, low to intermediate probability. Abnormal D-dimer. EXAM: NUCLEAR MEDICINE PERFUSION LUNG SCAN TECHNIQUE: Perfusion images were obtained in multiple projections after intravenous injection of radiopharmaceutical. Ventilation scans intentionally deferred if perfusion scan and chest x-ray adequate for interpretation during COVID 19 epidemic. RADIOPHARMACEUTICALS:  4.17 mCi Tc-33mMAA IV COMPARISON:  Chest radiography yesterday FINDINGS: No segmental or subsegmental defects suggestive of embolic disease. Defects related to layering pleural effusions. IMPRESSION: No finding to suggest pulmonary emboli. Layering pleural effusions, but no sign of arterial distribution hypoperfusion. Electronically Signed   By: MNelson ChimesM.D.   On: 09/07/2022 08:59   [place]Venous Img Lower Bilateral (DVT)  Result Date: 09/06/2022 CLINICAL DATA:  Leg swelling bilateral EXAM: Bilateral LOWER EXTREMITY VENOUS DOPPLER ULTRASOUND TECHNIQUE: Gray-scale sonography with compression, as well as color and duplex ultrasound, were performed to evaluate the deep venous system(s) from the level of the common femoral vein through the popliteal and proximal calf veins. COMPARISON:  None Available. FINDINGS: VENOUS Normal compressibility of the common femoral, superficial femoral, and popliteal veins, as well as the visualized calf veins.  Visualized portions of profunda femoral vein and great saphenous vein unremarkable. No filling defects to suggest DVT on grayscale or color Doppler imaging. Doppler waveforms show normal direction of venous flow, normal respiratory plasticity and response to augmentation. Limited views of the contralateral common femoral vein are unremarkable. OTHER Scattered soft tissue edema Limitations: none IMPRESSION: No evidence of bilateral lower extremity DVT.  Soft tissue edema Electronically Signed   By: AJill SideM.D.   On: 09/06/2022 18:57   DG Chest Port 1 View  Result Date: 09/06/2022 CLINICAL DATA:  Shortness of breath EXAM: PORTABLE CHEST 1 VIEW COMPARISON:  Chest x-ray 04/29/2022 FINDINGS: Small bilateral pleural effusions are present, left greater than right. There central pulmonary vascular congestion. There are patchy opacities in the lung bases. The heart is enlarged, unchanged. There is no pneumothorax or acute fracture. Cervical spinal fusion plate is present. IMPRESSION: 1. Cardiomegaly with  central pulmonary vascular congestion and small bilateral pleural effusions. 2. Patchy opacities in the lung bases may reflect edema or infection. Electronically Signed   By: Ronney Asters M.D.   On: 09/06/2022 17:32        Scheduled Meds:  apixaban  5 mg Oral BID   aspirin EC  81 mg Oral Daily   atorvastatin  10 mg Oral Daily   diltiazem  120 mg Oral Q12H   ezetimibe  10 mg Oral Daily   flecainide  100 mg Oral Q12H   insulin aspart  0-5 Units Subcutaneous QHS   insulin aspart  0-9 Units Subcutaneous TID WC   insulin detemir  8 Units Subcutaneous QHS   mycophenolate  500 mg Oral BID   potassium chloride SA  20 mEq Oral Daily   pregabalin  50 mg Oral BID   tacrolimus  3 mg Oral BID   Continuous Infusions:  furosemide (LASIX) 200 mg in dextrose 5 % 100 mL (2 mg/mL) infusion 4 mg/hr (09/08/22 1039)     LOS: 2 days     Sidney Ace, MD [facility]   If 7PM-7AM, please  contact night-coverage  09/08/2022, 2:32 PM

## 2022-09-08 NOTE — Consult Note (Signed)
   Heart Failure Nurse Navigator Note  HF-echocardiogram results pending on this admission.  Previous  report with ejection fraction of 60 to 65%.  Severe LVH.  Right ventricular systolic function was moderately reduced.  Severe biatrial enlargement.  Mild mitral regurgitation.   She presented to the emergency room with complaints of dyspnea, chest pain, orthopnea and lower extremity edema.  Blood pressure 172/91.  BNP 603.  Chest x-ray revealed pulmonary congestion.  Comorbidities:  End-stage renal disease status post kidney transplant Hypertension Hyperlipidemia Hypothyroidism Paroxysmal atrial fibrillation on NOAC Type 2 diabetes  Medications:  Apixaban 5 mg 2 times a day Aspirin 81 mg daily Atorvastatin 10 mg daily Diltiazem 120 mg every 12 hours Zetia 10 mg daily Flecainide 100 mg every 12 hours Furosemide infusion Potassium chloride 20 mEq daily   Labs:  Sodium 142, potassium 3.8, chloride 109, CO2 24, BUN 41, creatinine 1.52, estimated GFR 36 Weight 50.8 kg Intake 240 mL Output 800 mL   Initial meeting with patient who is lying quietly in bed in no acute distress.  There are no family members present at the bedside.  She states that she is heard the term heart failure in the past but not in relationship to her self.  Discussed how to take care of herself when she is discharged home with doing daily weights, first thing in the morning before eating and drinking and after going to the bathroom.  She states that she does not have 1.  I will supply her with one from the HF clinic.  Discussed the importance of reporting 2 pound weight gain overnight or 5 pounds total within the week.  Also discussed fluid and sodium restriction.  In fluids to no more than 64 ounces in a 24-hour period along with sticking with no more than 2000 mg of sodium daily.  Made aware of follow-up in the outpatient heart failure clinic on March 8 at 2:30 in the afternoon.  She has a 5% no-show  which is 4 out of 79 appointments.  She was given the living with heart failure teaching booklet, zone magnet, info on low-sodium and heart failure along with weight chart.  Pricilla Riffle RN [facility]

## 2022-09-08 NOTE — Progress Notes (Signed)
[place] NOTE       Patient ID: Jean Johnson MRN: [id number] DOB/AGE: 09-02-1950 72 y.o.  Admit date: 09/06/2022 Referring Physician Dr, Eugenie Norrie Primary Physician Dr. Tressia Miners Primary Cardiologist Dr. Clayborn Bigness Reason for Consultation AoCHF  HPI: Jean Johnson is a 72yoF with a PMH of paroxysmal AF (flecainide, Eliquis), HFpEF (60-65%, moderate MR 04/2022), ESRD s/p bilateral renal transplants, DM2 who presented to [facility] ED 09/06/2022 with chest tightness, peripheral edema, and orthopnea.  Cardiology is consulted for assistance with her heart failure.  Interval History:  - no acute events  - feels like her chest tightness is better, no shortness of breath.  - main issue is peripheral edema, legs heavy limiting mobility - echo pending  - in AF on tele, rate controlled in the 60s-80s  Review of systems complete and found to be negative unless listed above     Past Medical History:  Diagnosis Date   Anemia of chronic renal disease    Aortic stenosis, mild    a.) TTE on 11/23/2020 --> mean gradient 9.7 mmHg   Atrial fibrillation ([place])    a.) CHA2DS2VASc = 4 (age, sex, HTN, T2DM);  b.) rate/rhythm maintained on oral diltiazem + flecainide; chronically anticoagulated with apixaban   B12 deficiency    Cervical spinal stenosis    Diabetic neuropathy (HCC)    Diverticulosis    Dyspnea    ESRD (end stage renal disease) (HCC)    First degree AV block    Gastrointestinal ulcer    HLD (hyperlipidemia)    Hypertension    Hypothyroidism    Incomplete right bundle branch block (RBBB)    LAE (left atrial enlargement)    a.) TTE on 11/23/2020 --> moderate   Left thyroid nodule    a.) cervical MRI on 12/29/2020 --> measured "at least" 3.5 cm; incompletely imaged.   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   Valvular regurgitation    a.) TTE 11/23/2020 --> mild panvalvular regurgitation; b.) TTE 04/29/2022: mild MR    Past Surgical History:  Procedure Laterality Date   A/V FISTULAGRAM Left    ANTERIOR CERVICAL DECOMP/DISCECTOMY FUSION N/A 03/24/2021   Procedure: C3-6 ANTERIOR CERVICAL DECOMPRESSION/DISCECTOMY FUSION 3 LEVELS;  Surgeon: Meade Maw, MD;  Location: [facility] ORS;  Service: Neurosurgery;  Laterality: N/A;   ARTERY BIOPSY Right 08/30/2022   Procedure: BIOPSY TEMPORAL ARTERY;  Surgeon: Katha Cabal, MD;  Location: [facility] ORS;  Service: Vascular;  Laterality: Right;   CARPAL TUNNEL RELEASE Right 2016   CATARACT EXTRACTION W/ INTRAOCULAR LENS IMPLANT Right 03/2020   CATARACT EXTRACTION W/PHACO Left 10/21/2020   Procedure: CATARACT EXTRACTION PHACO AND INTRAOCULAR LENS PLACEMENT ([place]) DIABETES LEFT;  Surgeon: Leandrew Koyanagi, MD;  Location: [facility];  Service: Ophthalmology;  Laterality: Left;  2.84 0:48.8 5.8%   COLONOSCOPY WITH PROPOFOL N/A 09/03/2021   Procedure: COLONOSCOPY WITH PROPOFOL;  Surgeon: Lesly Rubenstein, MD;  Location: [facility] ENDOSCOPY;  Service: Endoscopy;  Laterality: N/A;  DM, ELIQUIS, WHEELCHAIR   EYE SURGERY Right 2020   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   Living donor organ recipient (sister)   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   Cadaveric organ recipient    Medications Prior to Admission  Medication Sig Dispense Refill Last Dose   alendronate (FOSAMAX) 70 MG tablet Take 70 mg by mouth once a week. monday   09/05/2022   apixaban (ELIQUIS) 5 MG TABS  tablet Take 5 mg by mouth 2 (two) times daily.   09/06/2022 at 0830   atorvastatin (LIPITOR) 10 MG tablet Take 10 mg by mouth daily.   09/05/2022   diltiazem (CARDIZEM CD) 240 MG 24 hr capsule Take 240 mg by mouth at bedtime.   09/05/2022 at 2200   ezetimibe (ZETIA) 10 MG tablet Take 10 mg by mouth daily.   09/05/2022   flecainide (TAMBOCOR) 100 MG tablet Take 100 mg by mouth every 12 (twelve) hours.   09/05/2022   furosemide (LASIX) 20 MG tablet Take 20 mg by mouth 2 (two) times daily as needed.   09/05/2022   insulin detemir (LEVEMIR) 100 UNIT/ML injection Inject 8 Units into the skin at bedtime.   09/05/2022   insulin lispro (HUMALOG) 100 UNIT/ML injection Inject 5 Units into the skin 2 (two) times daily.   09/05/2022   lisinopril (ZESTRIL) 30 MG tablet Take 30 mg by mouth daily.   09/05/2022   mycophenolate (CELLCEPT) 500 MG tablet Take 500 mg by mouth 2 (two) times daily.   09/05/2022   Potassium Chloride ER 20 MEQ TBCR Take 1 tablet by mouth daily as needed.   Past Month   predniSONE (DELTASONE) 20 MG tablet Take 20 mg by mouth 3 (three) times daily.   09/05/2022   pregabalin (LYRICA) 50 MG capsule Take 1 capsule (50 mg total) by mouth 2 (two) times daily. 60 capsule 0 09/05/2022   tacrolimus (PROGRAF) 1 MG capsule Take 3 mg by mouth 2 (two) times daily.   09/05/2022   traZODone (DESYREL) 50 MG tablet Take 0.5 tablets (25 mg total) by mouth at bedtime as needed for sleep. 15 tablet 0 unknown    Social History   Socioeconomic History   Marital status: Married    Spouse name: Francee Piccolo   Number of children: Not on file   Years of education: Not on file   Highest education level: Not on file  Occupational History   Not on file  Tobacco Use   Smoking status: Never   Smokeless tobacco: Never  Vaping Use   Vaping Use: Never used  Substance and Sexual Activity   Alcohol use: Not Currently   Drug use: Never   Sexual activity: Not Currently  Other Topics Concern   Not on file  Social History  Narrative   Not on file   Social Determinants of Health   Financial Resource Strain: Not on file  Food Insecurity: No Food Insecurity (09/07/2022)   Hunger Vital Sign    Worried About Running Out of Food in the Last Year: Never true    Ran Out of Food in the Last Year: Never true  Transportation Needs: No Transportation Needs (09/07/2022)   PRAPARE - Hydrologist (Medical): No    Lack of Transportation (Non-Medical): No  Physical Activity: Not on file  Stress: Not on file  Social Connections: Not on file  Intimate Partner Violence: Not At Risk (09/07/2022)   Humiliation, Afraid, Rape, and Kick questionnaire  Fear of Current or Ex-Partner: No    Emotionally Abused: No    Physically Abused: No    Sexually Abused: No    Family History  Problem Relation Age of Onset   Diabetes Father    Heart disease Father       Intake/Output Summary (Last 24 hours) at 09/08/2022 0843 Last data filed at 09/07/2022 2200 Gross per 24 hour  Intake 60 ml  Output 350 ml  Net -290 ml    Vitals:   09/08/22 0000 09/08/22 0341 09/08/22 0500 09/08/22 0832  BP: (!) 124/53 (!) 144/54  139/67  Pulse: (!) 58 (!) 58  68  Resp:    16  Temp: 97.9 F (36.6 C) 97.8 F (36.6 C)  97.7 F (36.5 C)  TempSrc: Oral Oral    SpO2: 100% 100%  92%  Weight:   50.8 kg   Height:        PHYSICAL EXAM General: elderly thin black female, in no acute distress. Laying on her left side in hospital bed, no family present HEENT:  Normocephalic and atraumatic. Neck:  No JVD.  Lungs: Normal respiratory effort on room air. Decreased breath sounds with basilar crackles.  Heart: irregularly irregular with controlled rate . Normal S1 and S2, 3/6 systolic murmur heard best at the apex  Abdomen: Non-distended appearing.  Msk: generalized weakness Extremities: cool to touch, No clubbing, cyanosis. 2+ bilateral LE edema.  Neuro: Alert and oriented X 3. Psych:  Answers questions appropriately.  Limited historian.   Labs: Basic Metabolic Panel: Recent Labs    09/06/22 2042 09/07/22 0316 09/08/22 0541  NA  --  140 142  K  --  3.8 3.8  CL  --  110 109  CO2  --  24 24  GLUCOSE  --  76 92  BUN  --  41* 41*  CREATININE  --  1.29* 1.52*  CALCIUM  --  8.3* 8.2*  MG 2.1  --  1.8  PHOS  --   --  3.7    Liver Function Tests: Recent Labs    09/07/22 0316 09/08/22 0541  AST 13* 14*  ALT 8 7  ALKPHOS 60 57  BILITOT 0.8 0.7  PROT 4.3* 4.3*  ALBUMIN 2.1* 2.0*    No results for input(s): "LIPASE", "AMYLASE" in the last 72 hours. CBC: Recent Labs    09/07/22 0316 09/08/22 0541  WBC 10.4 11.2*  NEUTROABS  --  9.6*  HGB 11.2* 11.1*  HCT 35.6* 35.4*  MCV 97.8 97.5  PLT 121* 105*    Cardiac Enzymes: Recent Labs    09/06/22 1603 09/06/22 1803  TROPONINIHS 61* 66*    BNP: Recent Labs    09/06/22 2000  BNP 603.0*    D-Dimer: Recent Labs    09/06/22 1605  DDIMER 1.45*    Hemoglobin A1C: No results for input(s): "HGBA1C" in the last 72 hours. Fasting Lipid Panel: No results for input(s): "CHOL", "HDL", "LDLCALC", "TRIG", "CHOLHDL", "LDLDIRECT" in the last 72 hours. Thyroid Function Tests: Recent Labs    09/06/22 2042  TSH 1.749    Anemia Panel: Recent Labs    09/08/22 0541  FOLATE 9.1  FERRITIN 905*  TIBC 129*  IRON 20*  RETICCTPCT 1.0     Radiology: [place] Chest Port 1 View  Result Date: 09/08/2022 CLINICAL DATA:  Acute on chronic congestive heart failure. EXAM: PORTABLE CHEST 1 VIEW COMPARISON:  09/06/22 FINDINGS: Stable cardiomediastinal contours. Increased volume of left pleural effusion. Now moderate to  large. Small right pleural effusion is improved in the interval. Similar appearance of mild diffuse interstitial edema. IMPRESSION: 1. Increased volume of left pleural effusion. 2. Improved right pleural effusion. 3. Persistent mild interstitial edema. Electronically Signed   By: Kerby Moors M.D.   On: 09/08/2022 08:16   NM Pulmonary  Perfusion  Result Date: 09/07/2022 CLINICAL DATA:  Pulmonary embolism suspected, low to intermediate probability. Abnormal D-dimer. EXAM: NUCLEAR MEDICINE PERFUSION LUNG SCAN TECHNIQUE: Perfusion images were obtained in multiple projections after intravenous injection of radiopharmaceutical. Ventilation scans intentionally deferred if perfusion scan and chest x-ray adequate for interpretation during COVID 19 epidemic. RADIOPHARMACEUTICALS:  4.17 mCi Tc-22mMAA IV COMPARISON:  Chest radiography yesterday FINDINGS: No segmental or subsegmental defects suggestive of embolic disease. Defects related to layering pleural effusions. IMPRESSION: No finding to suggest pulmonary emboli. Layering pleural effusions, but no sign of arterial distribution hypoperfusion. Electronically Signed   By: MNelson ChimesM.D.   On: 09/07/2022 08:59   UKoreaVenous Img Lower Bilateral (DVT)  Result Date: 09/06/2022 CLINICAL DATA:  Leg swelling bilateral EXAM: Bilateral LOWER EXTREMITY VENOUS DOPPLER ULTRASOUND TECHNIQUE: Gray-scale sonography with compression, as well as color and duplex ultrasound, were performed to evaluate the deep venous system(s) from the level of the common femoral vein through the popliteal and proximal calf veins. COMPARISON:  None Available. FINDINGS: VENOUS Normal compressibility of the common femoral, superficial femoral, and popliteal veins, as well as the visualized calf veins. Visualized portions of profunda femoral vein and great saphenous vein unremarkable. No filling defects to suggest DVT on grayscale or color Doppler imaging. Doppler waveforms show normal direction of venous flow, normal respiratory plasticity and response to augmentation. Limited views of the contralateral common femoral vein are unremarkable. OTHER Scattered soft tissue edema Limitations: none IMPRESSION: No evidence of bilateral lower extremity DVT.  Soft tissue edema Electronically Signed   By: AJill SideM.D.   On: 09/06/2022 18:57    DG Chest Port 1 View  Result Date: 09/06/2022 CLINICAL DATA:  Shortness of breath EXAM: PORTABLE CHEST 1 VIEW COMPARISON:  Chest x-ray 04/29/2022 FINDINGS: Small bilateral pleural effusions are present, left greater than right. There central pulmonary vascular congestion. There are patchy opacities in the lung bases. The heart is enlarged, unchanged. There is no pneumothorax or acute fracture. Cervical spinal fusion plate is present. IMPRESSION: 1. Cardiomegaly with central pulmonary vascular congestion and small bilateral pleural effusions. 2. Patchy opacities in the lung bases may reflect edema or infection. Electronically Signed   By: ARonney AstersM.D.   On: 09/06/2022 17:32   MR BRAIN WO CONTRAST  Result Date: 08/18/2022 CLINICAL DATA:  Bilateral visual loss. EXAM: MRI HEAD WITHOUT CONTRAST TECHNIQUE: Multiplanar, multiecho pulse sequences of the brain and surrounding structures were obtained without intravenous contrast. COMPARISON:  Head CT 04/14/2022 and MRI 08/06/2021 FINDINGS: Brain: There is no evidence of an acute infarct, mass, midline shift, or extra-axial fluid collection. A few scattered chronic cerebral microhemorrhages are again noted. T2 hyperintensities in the cerebral white matter and pons have mildly progressed from the prior MRI and are nonspecific but compatible with mild chronic small vessel ischemic disease. Mild cerebral atrophy is within normal limits for age. Vascular: Major intracranial vascular flow voids are preserved. Skull and upper cervical spine: Unremarkable bone marrow signal. Prior anterior cervical spine fusion. Sinuses/Orbits: Bilateral cataract extraction. No significant inflammatory changes in the paranasal sinuses. Clear mastoid air cells. Other: None. IMPRESSION: 1. No acute intracranial abnormality. 2. Mild chronic small vessel ischemic  disease, mildly progressed from 2023. Electronically Signed   By: Logan Bores M.D.   On: 08/18/2022 07:53    ECHO 04/2022   1. Left ventricular ejection fraction, by estimation, is 60 to 65%. The  left ventricle has normal function. The left ventricle has no regional  wall motion abnormalities. There is severe concentric left ventricular  hypertrophy. Left ventricular diastolic   function could not be evaluated.   2. Right ventricular systolic function is moderately reduced. The right  ventricular size is mildly enlarged. Severely increased right ventricular  wall thickness.   3. Left atrial size was severely dilated.   4. Right atrial size was severely dilated.   5. A small pericardial effusion is present. The pericardial effusion is  circumferential.   6. The mitral valve is grossly normal. Mild mitral valve regurgitation.   7. The aortic valve is calcified. Aortic valve regurgitation is not  visualized. Aortic valve sclerosis is present, with no evidence of aortic  valve stenosis.   TELEMETRY reviewed by me (LT) 09/08/2022 : AFL 70-80s  EKG reviewed by me: AF rate 107  Data reviewed by me (LT) 09/08/2022: ed note, admission H&P, last 24h vitals tele labs imaging I/O    Principal Problem:   Acute on chronic diastolic (congestive) heart failure (HCC) Active Problems:   Renal transplant recipient   Dyslipidemia   Chest pain   Elevated d-dimer   Paroxysmal atrial fibrillation ([place])   Type 2 diabetes mellitus with chronic kidney disease, with long-term current use of insulin (HCC)   Hypertensive urgency   Diabetic neuropathy ([place])   Acute on chronic diastolic heart failure ([place])    ASSESSMENT AND PLAN:  Jean Johnson is a 5yoF with a PMH of paroxysmal AF (flecainide, Eliquis), HFpEF (60-65%, moderate MR 04/2022), ESRD s/p bilateral renal transplants, DM2 who presented to [facility] ED 09/06/2022 with chest tightness, peripheral edema, and orthopnea.  Cardiology is consulted for assistance with her heart failure.  # atypical chest pain  # Acute on chronic HFpEF # moderate MR  Presents with 2 weeks  of chest tightness she has a difficult time describing, other than finding relief with laying on her left side.  EKG nonacute although in AF RVR on admission,  BNP elevated at 650, despite adherence to home lasix '20mg'$  daily. Remains clinically hypervolemic with 2+ pitting edema in both lower legs -change lasix IV to continuous infusion at '4mg'$ /hr -stop lisinopril 30 mg daily -Monitor I's/O and a low-salt diet -Echo complete  # Paroxysmal atrial fibrillation/flutter Currently rate controlled 60s-90s on telemetry, on flecainide 100 mg twice daily, breakup cardizem to '120mg'$  BID. On Eliquis 5 twice daily for stroke prevention. -Echo complete as above  # S/p renal transplants # CKD 3 Chronically immunosuppressed, still makes urine. Followed by nephrology outpatient. Cr around baseline (1.29 - 1.33)  - slight bump in Cr to 1.52 today  # Demand ischemia High-sensitivity troponin borderline elevated but flat trending at 61, 66 In the absence of ischemic EKG changes and in the setting of AoCHF this is most consistent with demand/supply mismatch and not ACS  -Continue atorvastatin  This patient's plan of care was discussed and created with Dr. Saralyn Pilar and he is in agreement.  Signed: Tristan Schroeder , PA-C 09/08/2022, 8:43 AM [facility]

## 2022-09-08 NOTE — Progress Notes (Signed)
Pt complaining of not being able to urinate. Bladder scanned yield 416 cc. Pt up to [facility], unable to urinate. Standing order for in and out. I&O cath performed, Pt tolerated well.

## 2022-09-09 DIAGNOSIS — I5033 Acute on chronic diastolic (congestive) heart failure: Secondary | ICD-10-CM | POA: Diagnosis not present

## 2022-09-09 LAB — BASIC METABOLIC PANEL
Anion gap: 9 (ref 5–15)
BUN: 44 mg/dL — ABNORMAL HIGH (ref 8–23)
CO2: 23 mmol/L (ref 22–32)
Calcium: 7.8 mg/dL — ABNORMAL LOW (ref 8.9–10.3)
Chloride: 109 mmol/L (ref 98–111)
Creatinine, Ser: 1.83 mg/dL — ABNORMAL HIGH (ref 0.44–1.00)
GFR, Estimated: 29 mL/min — ABNORMAL LOW (ref >60)
Glucose, Bld: 86 mg/dL (ref 70–99)
Potassium: 3.7 mmol/L (ref 3.5–5.1)
Sodium: 141 mmol/L (ref 135–145)

## 2022-09-09 LAB — ECHOCARDIOGRAM COMPLETE
AR max vel: 1.66 cm2
AV Area VTI: 2 cm2
AV Area mean vel: 1.86 cm2
AV Mean grad: 9.8 mmHg
AV Peak grad: 18 mmHg
Ao pk vel: 2.12 m/s
Area-P 1/2: 2.88 cm2
Height: 62 in
P 1/2 time: 508 msec
S' Lateral: 2.6 cm
Weight: 1791.9 oz

## 2022-09-09 LAB — MAGNESIUM: Magnesium: 1.9 mg/dL (ref 1.7–2.4)

## 2022-09-09 LAB — GLUCOSE, CAPILLARY
Glucose-Capillary: 54 mg/dL — ABNORMAL LOW (ref 70–99)
Glucose-Capillary: 47 mg/dL — ABNORMAL LOW (ref 70–99)
Glucose-Capillary: 120 mg/dL — ABNORMAL HIGH (ref 70–99)
Glucose-Capillary: 166 mg/dL — ABNORMAL HIGH (ref 70–99)
Glucose-Capillary: 158 mg/dL — ABNORMAL HIGH (ref 70–99)
Glucose-Capillary: 115 mg/dL — ABNORMAL HIGH (ref 70–99)

## 2022-09-09 MED ORDER — DEXTROSE 50 % IV SOLN
INTRAVENOUS | Status: AC
  Filled 2022-09-09: qty 50

## 2022-09-09 MED ORDER — APIXABAN 2.5 MG PO TABS
2.5000 mg | ORAL_TABLET | Freq: Two times a day (BID) | ORAL | Status: DC
  Administered 2022-09-09 – 2022-09-24 (×30): 2.5 mg via ORAL
  Filled 2022-09-09 (×30): qty 1

## 2022-09-09 MED ORDER — CHLORHEXIDINE GLUCONATE CLOTH 2 % EX PADS
6.0000 | MEDICATED_PAD | Freq: Every day | CUTANEOUS | Status: DC
  Administered 2022-09-10 – 2022-09-14 (×5): 6 via TOPICAL

## 2022-09-09 MED ORDER — DILTIAZEM HCL 60 MG PO TABS
60.0000 mg | ORAL_TABLET | Freq: Two times a day (BID) | ORAL | Status: DC
  Administered 2022-09-09 – 2022-09-24 (×29): 60 mg via ORAL
  Filled 2022-09-09: qty 2
  Filled 2022-09-09 (×2): qty 1
  Filled 2022-09-09: qty 2
  Filled 2022-09-09: qty 1
  Filled 2022-09-09: qty 2
  Filled 2022-09-09 (×3): qty 1
  Filled 2022-09-09 (×3): qty 2
  Filled 2022-09-09 (×2): qty 1
  Filled 2022-09-09: qty 2
  Filled 2022-09-09 (×4): qty 1
  Filled 2022-09-09 (×2): qty 2
  Filled 2022-09-09: qty 1
  Filled 2022-09-09: qty 2
  Filled 2022-09-09 (×6): qty 1
  Filled 2022-09-09: qty 2
  Filled 2022-09-09: qty 1

## 2022-09-09 MED ORDER — DEXTROSE 50 % IV SOLN
50.0000 mL | Freq: Once | INTRAVENOUS | Status: AC
  Administered 2022-09-09: 50 mL via INTRAVENOUS

## 2022-09-09 MED ORDER — ALBUMIN HUMAN 25 % IV SOLN
25.0000 g | Freq: Once | INTRAVENOUS | Status: AC
  Administered 2022-09-09: 25 g via INTRAVENOUS
  Filled 2022-09-09: qty 100

## 2022-09-09 NOTE — Progress Notes (Addendum)
This nurse notes pt on lasix gtt with no urine output so far this shift, pt's lungs sounds are dry and diminished throughout, charge nurse at bedside. Bladder scan shows 198 ml urine in bladder. NP notified, advised plan is to await am labs and notify nephrology, see new orders.

## 2022-09-09 NOTE — Progress Notes (Signed)
Physical Therapy Treatment Patient Details Name: Jean Johnson MRN: [id number] DOB: 10/22/1950 Today's Date: 09/09/2022   History of Present Illness Jean Johnson is a 72 y.o. African-American female with medical history significant for end-stage renal disease s/p renal transplant x2, hypertension, dyslipidemia, hypothyroidism, paroxysmal atrial fibrillation on Eliquis, PUD, type 2 diabetes mellitus with diabetic neuropathy, who presented to the emergency room with acute onset of worsening dyspnea with associated chest pain which has been progressively worse over the last week.    PT Comments    Pt was side lying in bed upon arriving. She is A and oriented x 2 but has cognition deficits come to light throughout session. Several times was talking off topic and family member (sister in-law) does attempt to keep pt focused on desired task at hand. Pt endorses sacral pain (wound pain) and BLE pain. She did tolerate getting OOB and standing 1 x with max assist from elevated bed height. Per family in room, pt could ambulate short distances several weeks ago but has been going downhill since COVID dx several months back. Pt is extremely weak but per family, is able to provide care required to assist pt at home at DC. Will need HH services at DC to maximize independence while decreasing caregiver burden.    Recommendations for follow up therapy are one component of a multi-disciplinary discharge planning process, led by the attending physician.  Recommendations may be updated based on patient status, additional functional criteria and insurance authorization.  Follow Up Recommendations Home health PT (pt and sister in-law state plans to return home at DC.)     Assistance Recommended at Discharge Frequent or constant Supervision/Assistance  Patient can return home with the following A lot of help with walking and/or transfers;A lot of help with bathing/dressing/bathroom;Assistance with  cooking/housework;Assistance with feeding;Direct supervision/assist for medications management;Direct supervision/assist for financial management;Assist for transportation;Help with stairs or ramp for entrance   Equipment Recommendations  None recommended by PT       Precautions / Restrictions Precautions Precautions: Fall Restrictions Weight Bearing Restrictions: No     Mobility  Bed Mobility Overal bed mobility: Needs Assistance Bed Mobility: Supine to Sit, Sit to Supine Supine to sit: Max assist, HOB elevated Sit to supine: Max assist, HOB elevated   Transfers Overall transfer level: Needs assistance Equipment used: Rolling walker (2 wheels) Transfers: Sit to/from Stand Sit to Stand: Max assist, From elevated surface, +2 safety/equipment  General transfer comment: Chief Strategy Officer assisted pt with standing while RN staff changed dressing/ pads. Poor standing abilities and tolerance.    Ambulation/Gait    General Gait Details: pt only able to take a few small shuffling steps from FOB towards HOB.    Balance Overall balance assessment: Needs assistance Sitting-balance support: Feet unsupported, Bilateral upper extremity supported Sitting balance-Leahy Scale: Poor Sitting balance - Comments: severe L lateral lean   Standing balance support: Bilateral upper extremity supported, During functional activity, Reliant on assistive device for balance Standing balance-Leahy Scale: Zero Standing balance comment: constant max assist to maintain standing EOB         Cognition Arousal/Alertness: Awake/alert Behavior During Therapy: WFL for tasks assessed/performed Overall Cognitive Status: Impaired/Different from baseline (sister in-law present and states cognition is not at baseline)      Problem Solving: Slow processing General Comments: Pt is alert but does have some cognition deficits come to light throughout session. pleasantly confused overall.  Pertinent  Vitals/Pain Pain Assessment Pain Assessment: 0-10 Pain Score: 7  Pain Location: sacral pain/ wound pain Pain Descriptors / Indicators: Aching, Burning, Discomfort Pain Intervention(s): Limited activity within patient's tolerance, Monitored during session, Premedicated before session, Repositioned     PT Goals (current goals can now be found in the care plan section) Acute Rehab PT Goals Patient Stated Goal: to return home Progress towards PT goals: Progressing toward goals    Frequency    Min 2X/week      PT Plan Current plan remains appropriate       AM-PAC PT "6 Clicks" Mobility   Outcome Measure  Help needed turning from your back to your side while in a flat bed without using bedrails?: A Lot Help needed moving from lying on your back to sitting on the side of a flat bed without using bedrails?: A Lot Help needed moving to and from a bed to a chair (including a wheelchair)?: Total Help needed standing up from a chair using your arms (e.g., wheelchair or bedside chair)?: Total Help needed to walk in hospital room?: Total Help needed climbing 3-5 steps with a railing? : Total 6 Click Score: 8    End of Session Equipment Utilized During Treatment: Gait belt Activity Tolerance: Patient limited by fatigue;Patient limited by pain Patient left: in bed;with call bell/phone within reach;with nursing/sitter in room;with family/visitor present Nurse Communication: Mobility status (RN + tech in room to assist pt with bath and dressing changes) PT Visit Diagnosis: Other abnormalities of gait and mobility (R26.89);Muscle weakness (generalized) (M62.81)     Time: [id number] PT Time Calculation (min) (ACUTE ONLY): 23 min  Charges:  $Therapeutic Activity: 23-37 mins                    Julaine Fusi PTA 09/09/22, 5:24 PM

## 2022-09-09 NOTE — Progress Notes (Signed)
[facility] [place] NOTE       Patient ID: Jean Johnson MRN: [id number] DOB/AGE: 02/18/1951 72 y.o.  Admit date: 09/06/2022 Referring Physician Dr, Eugenie Norrie Primary Physician Dr. Tressia Miners Primary Cardiologist Dr. Clayborn Bigness Reason for Consultation AoCHF  HPI: Jean Johnson is a 72yoF with a PMH of paroxysmal AF (flecainide, Eliquis), HFpEF (60-65%, moderate LVH, G1 DD, mild AS, mild-moderate TR 08/2022)  ESRD s/p renal transplants x2  (initially in 2000 but failed, second in 2008), DM2 who presented to [facility] ED 09/06/2022 with chest tightness, peripheral edema, and orthopnea.  Cardiology is consulted for assistance with her heart failure.  Interval History:  -Decreased urine output this morning despite Lasix infusion at 4 mg/h. -Creatinine continues to uptrend -The patient "does not feel great."  Peripheral edema slightly improved but remains significant in her lower extremities.  No chest tightness or shortness of breath - in AF on tele, rate controlled in the mid 50s to 70s -Echo with moderate LVH, mild AS, and mild-moderate TR, overall similar to prior from 2023 -Collateral history obtained from the patient's sister-in-law at bedside today, some concern for medication noncompliance, and increasing forgetfulness  Review of systems complete and found to be negative unless listed above     Past Medical History:  Diagnosis Date   Anemia of chronic renal disease    Aortic stenosis, mild    a.) TTE on 11/23/2020 --> mean gradient 9.7 mmHg   Atrial fibrillation ([place])    a.) CHA2DS2VASc = 4 (age, sex, HTN, T2DM);  b.) rate/rhythm maintained on oral diltiazem + flecainide; chronically anticoagulated with apixaban   B12 deficiency    Cervical spinal stenosis    Diabetic neuropathy (HCC)    Diverticulosis    Dyspnea    ESRD (end stage renal disease) (HCC)    First degree AV block    Gastrointestinal ulcer    HLD (hyperlipidemia)    Hypertension     Hypothyroidism    Incomplete right bundle branch block (RBBB)    LAE (left atrial enlargement)    a.) TTE on 11/23/2020 --> moderate   Left thyroid nodule    a.) cervical MRI on 12/29/2020 --> measured "at least" 3.5 cm; incompletely imaged.   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   Valvular regurgitation    a.) TTE 11/23/2020 --> mild panvalvular regurgitation; b.) TTE 04/29/2022: mild MR    Past Surgical History:  Procedure Laterality Date   A/V FISTULAGRAM Left    ANTERIOR CERVICAL DECOMP/DISCECTOMY FUSION N/A 03/24/2021   Procedure: C3-6 ANTERIOR CERVICAL DECOMPRESSION/DISCECTOMY FUSION 3 LEVELS;  Surgeon: Meade Maw, MD;  Location: [facility] ORS;  Service: Neurosurgery;  Laterality: N/A;   ARTERY BIOPSY Right 08/30/2022   Procedure: BIOPSY TEMPORAL ARTERY;  Surgeon: Katha Cabal, MD;  Location: [facility] ORS;  Service: Vascular;  Laterality: Right;   CARPAL TUNNEL RELEASE Right 2016   CATARACT EXTRACTION W/ INTRAOCULAR LENS IMPLANT Right 03/2020  CATARACT EXTRACTION W/PHACO Left 10/21/2020   Procedure: CATARACT EXTRACTION PHACO AND INTRAOCULAR LENS PLACEMENT ([place]) DIABETES LEFT;  Surgeon: Leandrew Koyanagi, MD;  Location: [facility];  Service: Ophthalmology;  Laterality: Left;  2.84 0:48.8 5.8%   COLONOSCOPY WITH PROPOFOL N/A 09/03/2021   Procedure: COLONOSCOPY WITH PROPOFOL;  Surgeon: Lesly Rubenstein, MD;  Location: [facility] ENDOSCOPY;  Service:  Endoscopy;  Laterality: N/A;  DM, ELIQUIS, WHEELCHAIR   EYE SURGERY Right 2020   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   Living donor organ recipient (sister)   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   Cadaveric organ recipient    Medications Prior to Admission  Medication Sig Dispense Refill Last Dose   alendronate (FOSAMAX) 70 MG tablet Take 70 mg by mouth once a week. monday   09/05/2022   apixaban (ELIQUIS) 5 MG TABS tablet Take 5 mg by mouth 2 (two) times daily.   09/06/2022 at 0830   atorvastatin (LIPITOR) 10 MG tablet Take 10 mg by mouth daily.   09/05/2022   diltiazem (CARDIZEM CD) 240 MG 24 hr capsule Take 240 mg by mouth at bedtime.   09/05/2022 at 2200   ezetimibe (ZETIA) 10 MG tablet Take 10 mg by mouth daily.   09/05/2022   flecainide (TAMBOCOR) 100 MG tablet Take 100 mg by mouth every 12 (twelve) hours.   09/05/2022   furosemide (LASIX) 20 MG tablet Take 20 mg by mouth 2 (two) times daily as needed.   09/05/2022   insulin detemir (LEVEMIR) 100 UNIT/ML injection Inject 8 Units into the skin at bedtime.   09/05/2022   insulin lispro (HUMALOG) 100 UNIT/ML injection Inject 5 Units into the skin 2 (two) times daily.   09/05/2022   lisinopril (ZESTRIL) 30 MG tablet Take 30 mg by mouth daily.   09/05/2022   mycophenolate (CELLCEPT) 500 MG tablet Take 500 mg by mouth 2 (two) times daily.   09/05/2022   Potassium Chloride ER 20 MEQ TBCR Take 1 tablet by mouth daily as needed.   Past Month   predniSONE (DELTASONE) 20 MG tablet Take 20 mg by mouth 3 (three) times daily.   09/05/2022   pregabalin (LYRICA) 50 MG capsule Take 1 capsule (50 mg total) by mouth 2 (two) times daily. 60 capsule 0 09/05/2022   tacrolimus (PROGRAF) 1 MG capsule Take 3 mg by mouth 2 (two) times daily.   09/05/2022   traZODone (DESYREL) 50 MG tablet Take 0.5 tablets (25 mg total) by mouth at bedtime as needed for sleep. 15 tablet 0 unknown    Social History   Socioeconomic History   Marital status: Married    Spouse name: Francee Piccolo    Number of children: Not on file   Years of education: Not on file   Highest education level: Not on file  Occupational History   Not on file  Tobacco Use   Smoking status: Never   Smokeless tobacco: Never  Vaping Use   Vaping Use: Never used  Substance and Sexual Activity   Alcohol use: Not Currently   Drug use: Never   Sexual activity: Not Currently  Other Topics Concern   Not on file  Social History Narrative   Not on file   Social Determinants of Health   Financial Resource Strain: Not on file  Food Insecurity: No Food Insecurity (09/07/2022)   Hunger Vital Sign    Worried About Running Out of Food in the Last Year: Never true    Ran Out of Food in the Last Year: Never  true  Transportation Needs: No Transportation Needs (09/07/2022)   PRAPARE - Hydrologist (Medical): No    Lack of Transportation (Non-Medical): No  Physical Activity: Not on file  Stress: Not on file  Social Connections: Not on file  Intimate Partner Violence: Not At Risk (09/07/2022)   Humiliation, Afraid, Rape, and Kick questionnaire    Fear of Current or Ex-Partner: No    Emotionally Abused: No    Physically Abused: No    Sexually Abused: No    Family History  Problem Relation Age of Onset   Diabetes Father    Heart disease Father       Intake/Output Summary (Last 24 hours) at 09/09/2022 1356 Last data filed at 09/09/2022 0930 Gross per 24 hour  Intake 490.06 ml  Output 0 ml  Net 490.06 ml     Vitals:   09/09/22 0350 09/09/22 0826 09/09/22 1137 09/09/22 1347  BP: (!) 114/52 134/76 (!) 136/50   Pulse: (!) 53 66 64   Resp:  16 16   Temp:  98.2 F (36.8 C) 98.3 F (36.8 C)   TempSrc:  Oral Oral   SpO2:  100% 99%   Weight:    50.9 kg  Height:        PHYSICAL EXAM General: elderly thin black female, in no acute distress. Laying on her left side in hospital bed, husband and sister-in-law at bedside. HEENT:  Normocephalic and atraumatic. Neck:  No JVD.   Lungs: Normal respiratory effort on room air. Decreased breath sounds with basilar crackles.  Heart: irregularly irregular with controlled rate . Normal S1 and S2, 3/6 systolic murmur heard best at the apex  Abdomen: Non-distended appearing.  Msk: generalized weakness Extremities: cool to touch, No clubbing, cyanosis. 2+ bilateral LE edema.  Neuro: Alert and oriented X 3. Psych:  Answers questions appropriately. Limited historian.   Labs: Basic Metabolic Panel: Recent Labs    09/08/22 0541 09/09/22 0556  NA 142 141  K 3.8 3.7  CL 109 109  CO2 24 23  GLUCOSE 92 86  BUN 41* 44*  CREATININE 1.52* 1.83*  CALCIUM 8.2* 7.8*  MG 1.8 1.9  PHOS 3.7  --     Liver Function Tests: Recent Labs    09/07/22 0316 09/08/22 0541  AST 13* 14*  ALT 8 7  ALKPHOS 60 57  BILITOT 0.8 0.7  PROT 4.3* 4.3*  ALBUMIN 2.1* 2.0*    No results for input(s): "LIPASE", "AMYLASE" in the last 72 hours. CBC: Recent Labs    09/07/22 0316 09/08/22 0541  WBC 10.4 11.2*  NEUTROABS  --  9.6*  HGB 11.2* 11.1*  HCT 35.6* 35.4*  MCV 97.8 97.5  PLT 121* 105*    Cardiac Enzymes: Recent Labs    09/06/22 1603 09/06/22 1803  TROPONINIHS 61* 66*    BNP: Recent Labs    09/06/22 2000  BNP 603.0*    D-Dimer: Recent Labs    09/06/22 1605  DDIMER 1.45*    Hemoglobin A1C: No results for input(s): "HGBA1C" in the last 72 hours. Fasting Lipid Panel: No results for input(s): "CHOL", "HDL", "LDLCALC", "TRIG", "CHOLHDL", "LDLDIRECT" in the last 72 hours. Thyroid Function Tests: Recent Labs    09/06/22 2042  TSH 1.749    Anemia Panel: Recent Labs    09/08/22 0541  VITAMINB12 186  FOLATE 9.1  FERRITIN 905*  TIBC 129*  IRON 20*  RETICCTPCT 1.0      Radiology: ECHOCARDIOGRAM COMPLETE  Result Date: 09/09/2022    ECHOCARDIOGRAM REPORT   Patient Name:   Jean Johnson Date of Exam: 09/08/2022 Medical Rec #:  [id number]           Height:       62.0 in Accession #:    [id number]           Weight:       112.0 lb Date of Birth:  1951/05/27           BSA:          1.494 m Patient Age:    58 years            BP:           139/67 mmHg Patient Gender: F                   HR:           68 bpm. Exam Location:  [facility] Procedure: 2D Echo, Cardiac Doppler and Color Doppler Indications:     CHF-acute diastolic [id number]  History:         Patient has prior history of Echocardiogram examinations, most                  recent 04/29/2022. Risk Factors:Hypertension and Dyslipidemia.  Sonographer:     Sherrie Sport Referring Phys:  [id number] [place] Elizandro Laura Diagnosing Phys: Isaias Cowman MD IMPRESSIONS  1. Left ventricular ejection fraction, by estimation, is 60 to 65%. The left ventricle has normal function. The left ventricle has no regional wall motion abnormalities. There is moderate concentric left ventricular hypertrophy. Left ventricular diastolic parameters are consistent with Grade I diastolic dysfunction (impaired relaxation).  2. Right ventricular systolic function is normal. The right ventricular size is normal.  3. A small pericardial effusion is present. Moderate pleural effusion.  4. The mitral valve is normal in structure. No evidence of mitral valve regurgitation. No evidence of mitral stenosis.  5. Tricuspid valve regurgitation is mild to moderate.  6. The aortic valve is normal in structure. Aortic valve regurgitation is mild. Mild aortic valve stenosis.  7. The inferior vena cava is normal in size with greater than 50% respiratory variability, suggesting right atrial pressure of 3 mmHg. FINDINGS  Left Ventricle: Left ventricular ejection fraction, by estimation, is 60 to 65%. The left ventricle has normal function. The left ventricle has no regional wall motion abnormalities. The left ventricular internal cavity size was normal in size. There is  moderate concentric left ventricular hypertrophy. Left ventricular diastolic parameters are consistent with Grade I diastolic dysfunction (impaired  relaxation). Right Ventricle: The right ventricular size is normal. No increase in right ventricular wall thickness. Right ventricular systolic function is normal. Left Atrium: Left atrial size was normal in size. Right Atrium: Right atrial size was normal in size. Pericardium: A small pericardial effusion is present. Mitral Valve: The mitral valve is normal in structure. No evidence of mitral valve regurgitation. No evidence of mitral valve stenosis. Tricuspid Valve: The tricuspid valve is normal in structure. Tricuspid valve regurgitation is mild to moderate. No evidence of tricuspid stenosis. Aortic Valve: The aortic valve is normal in structure. Aortic valve regurgitation is mild. Aortic regurgitation PHT measures 508 msec. Mild aortic stenosis is present. Aortic valve mean gradient measures 9.8 mmHg. Aortic valve peak gradient measures 18.0  mmHg. Aortic valve area, by VTI measures 2.00 cm. Pulmonic Valve: The pulmonic valve was normal in structure. Pulmonic valve regurgitation is not visualized. No  evidence of pulmonic stenosis. Aorta: The aortic root is normal in size and structure. Venous: The inferior vena cava is normal in size with greater than 50% respiratory variability, suggesting right atrial pressure of 3 mmHg. IAS/Shunts: No atrial level shunt detected by color flow Doppler. Additional Comments: There is a moderate pleural effusion.  LEFT VENTRICLE PLAX 2D LVIDd:         4.00 cm   Diastology LVIDs:         2.60 cm   LV e' medial:    7.07 cm/s LV PW:         1.80 cm   LV E/e' medial:  14.7 LV IVS:        1.50 cm   LV e' lateral:   9.36 cm/s LVOT diam:     2.00 cm   LV E/e' lateral: 11.1 LV SV:         71 LV SV Index:   48 LVOT Area:     3.14 cm  RIGHT VENTRICLE RV Basal diam:  2.70 cm RV Mid diam:    1.50 cm RV S prime:     11.60 cm/s TAPSE (M-mode): 1.7 cm LEFT ATRIUM             Index        RIGHT ATRIUM           Index LA diam:        4.60 cm 3.08 cm/m   RA Area:     16.40 cm LA Vol (A2C):    37.8 ml 25.30 ml/m  RA Volume:   42.20 ml  28.24 ml/m LA Vol (A4C):   70.1 ml 46.92 ml/m LA Biplane Vol: 55.7 ml 37.28 ml/m  AORTIC VALVE AV Area (Vmax):    1.66 cm AV Area (Vmean):   1.86 cm AV Area (VTI):     2.00 cm AV Vmax:           212.00 cm/s AV Vmean:          142.750 cm/s AV VTI:            0.356 m AV Peak Grad:      18.0 mmHg AV Mean Grad:      9.8 mmHg LVOT Vmax:         112.00 cm/s LVOT Vmean:        84.500 cm/s LVOT VTI:          0.226 m LVOT/AV VTI ratio: 0.64 AI PHT:            508 msec  AORTA Ao Root diam: 2.30 cm MITRAL VALVE                TRICUSPID VALVE MV Area (PHT): 2.88 cm     TR Peak grad:   36.0 mmHg MV Decel Time: 263 msec     TR Vmax:        300.00 cm/s MV E velocity: 104.00 cm/s MV A velocity: 33.00 cm/s   SHUNTS MV E/A ratio:  3.15         Systemic VTI:  0.23 m                             Systemic Diam: 2.00 cm Isaias Cowman MD Electronically signed by Isaias Cowman MD Signature Date/Time: 09/09/2022/7:58:45 AM    Final    DG Chest Port 1 View  Result Date: 09/08/2022 CLINICAL DATA:  Acute on chronic  congestive heart failure. EXAM: PORTABLE CHEST 1 VIEW COMPARISON:  09/06/22 FINDINGS: Stable cardiomediastinal contours. Increased volume of left pleural effusion. Now moderate to large. Small right pleural effusion is improved in the interval. Similar appearance of mild diffuse interstitial edema. IMPRESSION: 1. Increased volume of left pleural effusion. 2. Improved right pleural effusion. 3. Persistent mild interstitial edema. Electronically Signed   By: Kerby Moors M.D.   On: 09/08/2022 08:16   NM Pulmonary Perfusion  Result Date: 09/07/2022 CLINICAL DATA:  Pulmonary embolism suspected, low to intermediate probability. Abnormal D-dimer. EXAM: NUCLEAR MEDICINE PERFUSION LUNG SCAN TECHNIQUE: Perfusion images were obtained in multiple projections after intravenous injection of radiopharmaceutical. Ventilation scans intentionally deferred if perfusion scan and  chest x-ray adequate for interpretation during COVID 19 epidemic. RADIOPHARMACEUTICALS:  4.17 mCi Tc-47mMAA IV COMPARISON:  Chest radiography yesterday FINDINGS: No segmental or subsegmental defects suggestive of embolic disease. Defects related to layering pleural effusions. IMPRESSION: No finding to suggest pulmonary emboli. Layering pleural effusions, but no sign of arterial distribution hypoperfusion. Electronically Signed   By: MNelson ChimesM.D.   On: 09/07/2022 08:59   UKoreaVenous Img Lower Bilateral (DVT)  Result Date: 09/06/2022 CLINICAL DATA:  Leg swelling bilateral EXAM: Bilateral LOWER EXTREMITY VENOUS DOPPLER ULTRASOUND TECHNIQUE: Gray-scale sonography with compression, as well as color and duplex ultrasound, were performed to evaluate the deep venous system(s) from the level of the common femoral vein through the popliteal and proximal calf veins. COMPARISON:  None Available. FINDINGS: VENOUS Normal compressibility of the common femoral, superficial femoral, and popliteal veins, as well as the visualized calf veins. Visualized portions of profunda femoral vein and great saphenous vein unremarkable. No filling defects to suggest DVT on grayscale or color Doppler imaging. Doppler waveforms show normal direction of venous flow, normal respiratory plasticity and response to augmentation. Limited views of the contralateral common femoral vein are unremarkable. OTHER Scattered soft tissue edema Limitations: none IMPRESSION: No evidence of bilateral lower extremity DVT.  Soft tissue edema Electronically Signed   By: AJill SideM.D.   On: 09/06/2022 18:57   DG Chest Port 1 View  Result Date: 09/06/2022 CLINICAL DATA:  Shortness of breath EXAM: PORTABLE CHEST 1 VIEW COMPARISON:  Chest x-ray 04/29/2022 FINDINGS: Small bilateral pleural effusions are present, left greater than right. There central pulmonary vascular congestion. There are patchy opacities in the lung bases. The heart is enlarged,  unchanged. There is no pneumothorax or acute fracture. Cervical spinal fusion plate is present. IMPRESSION: 1. Cardiomegaly with central pulmonary vascular congestion and small bilateral pleural effusions. 2. Patchy opacities in the lung bases may reflect edema or infection. Electronically Signed   By: ARonney AstersM.D.   On: 09/06/2022 17:32   MR BRAIN WO CONTRAST  Result Date: 08/18/2022 CLINICAL DATA:  Bilateral visual loss. EXAM: MRI HEAD WITHOUT CONTRAST TECHNIQUE: Multiplanar, multiecho pulse sequences of the brain and surrounding structures were obtained without intravenous contrast. COMPARISON:  Head CT 04/14/2022 and MRI 08/06/2021 FINDINGS: Brain: There is no evidence of an acute infarct, mass, midline shift, or extra-axial fluid collection. A few scattered chronic cerebral microhemorrhages are again noted. T2 hyperintensities in the cerebral white matter and pons have mildly progressed from the prior MRI and are nonspecific but compatible with mild chronic small vessel ischemic disease. Mild cerebral atrophy is within normal limits for age. Vascular: Major intracranial vascular flow voids are preserved. Skull and upper cervical spine: Unremarkable bone marrow signal. Prior anterior cervical spine fusion. Sinuses/Orbits: Bilateral cataract extraction. No significant  inflammatory changes in the paranasal sinuses. Clear mastoid air cells. Other: None. IMPRESSION: 1. No acute intracranial abnormality. 2. Mild chronic small vessel ischemic disease, mildly progressed from 2023. Electronically Signed   By: Logan Bores M.D.   On: 08/18/2022 07:53    ECHO 04/2022  1. Left ventricular ejection fraction, by estimation, is 60 to 65%. The  left ventricle has normal function. The left ventricle has no regional  wall motion abnormalities. There is severe concentric left ventricular  hypertrophy. Left ventricular diastolic   function could not be evaluated.   2. Right ventricular systolic function is  moderately reduced. The right  ventricular size is mildly enlarged. Severely increased right ventricular  wall thickness.   3. Left atrial size was severely dilated.   4. Right atrial size was severely dilated.   5. A small pericardial effusion is present. The pericardial effusion is  circumferential.   6. The mitral valve is grossly normal. Mild mitral valve regurgitation.   7. The aortic valve is calcified. Aortic valve regurgitation is not  visualized. Aortic valve sclerosis is present, with no evidence of aortic  valve stenosis.   TELEMETRY reviewed by me (LT) 09/09/2022 : Atrial fibrillation rate mid 50s to 60s  EKG reviewed by me: AF rate 107  Data reviewed by me (LT) 09/09/2022: Hospitalist progress note,, last 24h vitals tele labs imaging I/O    Principal Problem:   Acute on chronic diastolic (congestive) heart failure (HCC) Active Problems:   Renal transplant recipient   Dyslipidemia   Chest pain   Elevated d-dimer   Paroxysmal atrial fibrillation ([place])   Type 2 diabetes mellitus with chronic kidney disease, with long-term current use of insulin (HCC)   Hypertensive urgency   Diabetic neuropathy ([place])   Acute on chronic diastolic heart failure ([place])    ASSESSMENT AND PLAN:  Jean Johnson is a 74yoF with a PMH of paroxysmal AF (flecainide, Eliquis), HFpEF (60-65%, moderate MR 04/2022), ESRD s/p bilateral renal transplants, DM2 who presented to [facility] ED 09/06/2022 with chest tightness, peripheral edema, and orthopnea.  Cardiology is consulted for assistance with her heart failure.  # atypical chest pain  # Acute on chronic HFpEF # moderate LVH # mild AS Presents with 2 weeks of chest tightness she has a difficult time describing, other than finding relief with laying on her left side.  EKG nonacute although in AF RVR on admission,  BNP elevated at 650. Per collateral info from pt's sister in law, some concern for medication noncompliance (prescribed lasix '20mg'$  PO  daily). Remains clinically hypervolemic with 2+ pitting edema in both lower legs, now with decreased UOP despite lasix gtt. Echo without much change from prior, redemonstrated mod LVH, and G1DD -appreciate nephrology assistance, they recommend increasing lasix infusion from '4mg'$ /hr to '8mg'$ /hr -stop lisinopril 30 mg daily -Monitor I's/O, low-salt diet  # Paroxysmal atrial fibrillation/flutter Currently rate controlled 50s-70s on telemetry, on flecainide 100 mg twice daily,  -Decrease cardizem to '60mg'$  BID with relative bradycardia  -Decrease Eliquis from '5mg'$  BID to 2.'5mg'$  BID with Cr increase and baseline weight 49kg for stroke prevention.  # S/p renal transplants # AKI on CKD 3 Chronically immunosuppressed, still makes urine. Followed by nephrology outpatient. Cr baseline (1.29 - 1.33)  -  Cr continues to uptrend from 1.29 -- 1.52 -- 1.83 and GFR 29 - nephrology following, appreciate assitance  # Demand ischemia High-sensitivity troponin borderline elevated but flat trending at 61, 66 In the absence of ischemic EKG changes  and in the setting of AoCHF this is most consistent with demand/supply mismatch and not ACS  -Continue atorvastatin  This patient's plan of care was discussed and created with Dr. Saralyn Pilar and he is in agreement.  Signed: Tristan Schroeder , PA-C 09/09/2022, 1:56 PM [facility]

## 2022-09-09 NOTE — Inpatient Diabetes Management (Signed)
Inpatient Diabetes Program Recommendations  AACE/ADA: New Consensus Statement on Inpatient Glycemic Control (2015)  Target Ranges:  Prepandial:   less than 140 mg/dL      Peak postprandial:   less than 180 mg/dL (1-2 hours)      Critically ill patients:  140 - 180 mg/dL   Lab Results  Component Value Date   GLUCAP 120 (H) 09/09/2022   HGBA1C 5.7 (H) 04/14/2022    Review of Glycemic Control  Latest Reference Range & Units 09/08/22 20:57 09/09/22 08:25 09/09/22 09:11 09/09/22 10:08  Glucose-Capillary 70 - 99 mg/dL 107 (H) 54 (L) 47 (L) 120 (H)  (H): Data is abnormally high (L): Data is abnormally low Diabetes history: Type 2 DM Outpatient Diabetes medications: Levemir 8 units QHS, Humalog 5 units BID Current orders for Inpatient glycemic control: Novolog 0-9 units TID & HS, Levemir 8 units QHS  Inpatient Diabetes Program Recommendations:   Noted hypoglycemia this AM of 47 mg/dL. With worsening renal status consider discontinuing Levemir 8 units QHS.   Thanks, Bronson Curb, MSN, RNC-OB Diabetes Coordinator [phone] (8a-5p)

## 2022-09-09 NOTE — Progress Notes (Addendum)
       CROSS COVER NOTE  NAME: Jean Johnson MRN: [id number] DOB : 1950/08/24    HPI/Events of Note   Nurse reports no urine output this shift despite continued lasix drip and bladder scan 198.  VSS and breathing unlabored/ clear  Assessment and  Interventions   Assessment: Worseing renal function with CHF exacerbation Poor oncotic pressure with albumin 2.0 Plan: Albumin 25 gm x1 Continue lasix infusion Continue strict I & Jenetta Downer        Kathlene Cote NP [facility]

## 2022-09-09 NOTE — Plan of Care (Signed)
  Problem: Education: Goal: Ability to demonstrate management of disease process will improve Outcome: Progressing   Problem: Activity: Goal: Capacity to carry out activities will improve Outcome: Not Progressing   Problem: Nutritional: Goal: Maintenance of adequate nutrition will improve Outcome: Not Progressing

## 2022-09-09 NOTE — Progress Notes (Signed)
Pt still has had not urine output, Per pt feels like she needs to urinate but unable to. Bladder scanned pt yield 486 cc. Dr. Rande Brunt notifed. Per MD put foley in.

## 2022-09-09 NOTE — Progress Notes (Addendum)
[facility]  ROUNDING NOTE   Subjective:   Jean Johnson is a 72 y.o. female with past medical conditions including diabetes, atrial fibrillation on Eliquis, dyslipidemia, hypertension, end-stage renal disease status post renal transplant x 2, diabetic neuropathy, and PUD.  Patient presents to the emergency department with complaints of shortness of breath and chest pain.  Patient has been admitted for Acute on chronic diastolic heart failure (HCC) [I50.33] Acute on chronic diastolic (congestive) heart failure (HCC) [I50.33] Dyspnea, unspecified type [R06.00]  Patient is known to our practice and is followed outpatient by Dr. Lanora Manis.  Patient was last seen in office on 08/23/2022 for routine follow-up.  Patient presented to the emergency department for progressive symptoms mentioned above for over a week.  Patient reports compliance with all medications.  Denies nausea, vomiting, or diarrhea.  Denies abdominal pain.  No known fever or chills.  Chest x-ray shows pulmonary vascular congestion and small bilateral pleural effusions with patchy opacities.  Patient believed to be in acute on chronic heart failure and placed on furosemide drip.  We have been consulted to monitor acute kidney injury.  Objective:  Vital signs in last 24 hours:  Temp:  [97.9 F (36.6 C)-98.3 F (36.8 C)] 98.3 F (36.8 C) (03/01 1137) Pulse Rate:  [53-70] 64 (03/01 1137) Resp:  [15-18] 16 (03/01 1137) BP: (86-136)/(42-76) 136/50 (03/01 1137) SpO2:  [99 %-100 %] 99 % (03/01 1137)  Weight change:  Filed Weights   09/06/22 1554 09/08/22 0500  Weight: 46.7 kg 50.8 kg    Intake/Output: I/O last 3 completed shifts: In: 490.1 [P.O.:360; I.V.:30.1; IV Piggyback:100] Out: 1150 [Urine:1150]   Intake/Output this shift:  Total I/O In: 240 [P.O.:240] Out: 0   Physical Exam: General: NAD, frail  Head: Normocephalic, atraumatic. Moist oral mucosal membranes  Eyes: Anicteric  Neck: Supple,  trachea midline  Lungs:  Clear to auscultation, normal effort  Heart: Regular rate and rhythm  Abdomen:  Soft, nontender, nondistended  Extremities: 1+ peripheral edema.  Neurologic: Nonfocal, moving all four extremities  Skin: No lesions  Access: None    Basic Metabolic Panel: Recent Labs  Lab 09/06/22 1603 09/06/22 2042 09/07/22 0316 09/08/22 0541 09/09/22 0556  NA 142  --  140 142 141  K 3.6  --  3.8 3.8 3.7  CL 109  --  110 109 109  CO2 26  --  '24 24 23  '$ GLUCOSE 96  --  76 92 86  BUN 38*  --  41* 41* 44*  CREATININE 1.33*  --  1.29* 1.52* 1.83*  CALCIUM 8.9  --  8.3* 8.2* 7.8*  MG  --  2.1  --  1.8 1.9  PHOS  --   --   --  3.7  --     Liver Function Tests: Recent Labs  Lab 09/07/22 0316 09/08/22 0541  AST 13* 14*  ALT 8 7  ALKPHOS 60 57  BILITOT 0.8 0.7  PROT 4.3* 4.3*  ALBUMIN 2.1* 2.0*   No results for input(s): "LIPASE", "AMYLASE" in the last 168 hours. No results for input(s): "AMMONIA" in the last 168 hours.  CBC: Recent Labs  Lab 09/06/22 1603 09/07/22 0316 09/08/22 0541  WBC 10.8* 10.4 11.2*  NEUTROABS  --   --  9.6*  HGB 13.4 11.2* 11.1*  HCT 42.6 35.6* 35.4*  MCV 97.0 97.8 97.5  PLT 138* 121* 105*    Cardiac Enzymes: No results for input(s): "CKTOTAL", "CKMB", "CKMBINDEX", "TROPONINI" in the last 168  hours.  BNP: Invalid input(s): "POCBNP"  CBG: Recent Labs  Lab 09/08/22 2057 09/09/22 0825 09/09/22 0911 09/09/22 1008 09/09/22 1216  GLUCAP 107* 54* 47* 120* 166*    Microbiology: Results for orders placed or performed during the hospital encounter of 08/29/22  Surgical pcr screen     Status: None   Collection Time: 08/29/22 11:08 AM   Specimen: Nasal Mucosa; Nasal Swab  Result Value Ref Range Status   MRSA, PCR NEGATIVE NEGATIVE Final   Staphylococcus aureus NEGATIVE NEGATIVE Final    Comment: (NOTE) The Xpert SA Assay (FDA approved for NASAL specimens in patients 69 years of age and older), is one component of a  comprehensive surveillance program. It is not intended to diagnose infection nor to guide or monitor treatment. Performed at [facility], [address] [place], [place] [address]     Coagulation Studies: No results for input(s): "LABPROT", "INR" in the last 72 hours.  Urinalysis: Recent Labs    09/06/22 2353  [place] 1.007  PHURINE 5.0  [place] NEGATIVE  PROTEINUR >=300*  NITRITE NEGATIVE  LEUKOCYTESUR NEGATIVE      Imaging: ECHOCARDIOGRAM COMPLETE  Result Date: 09/09/2022    ECHOCARDIOGRAM REPORT   Patient Name:   Jean Johnson Date of Exam: 09/08/2022 Medical Rec #:  [id number]           Height:       62.0 in Accession #:    [id number]          Weight:       112.0 lb Date of Birth:  [birth year]           BSA:          1.494 m Patient Age:    33 years            BP:           139/67 mmHg Patient Gender: F                   HR:           68 bpm. Exam Location:  [facility] Procedure: 2D Echo, Cardiac Doppler and Color Doppler Indications:     CHF-acute diastolic [id number]  History:         Patient has prior history of Echocardiogram examinations, most                  recent 04/29/2022. Risk Factors:Hypertension and Dyslipidemia.  Sonographer:     Sherrie Sport Referring Phys:  [id number] [place] TANG Diagnosing Phys: Isaias Cowman MD IMPRESSIONS  1. Left ventricular ejection fraction, by estimation, is 60 to 65%. The left ventricle has normal function. The left ventricle has no regional wall motion abnormalities. There is moderate concentric left ventricular hypertrophy. Left ventricular diastolic parameters are consistent with Grade I diastolic dysfunction (impaired relaxation).  2. Right ventricular systolic function is normal. The right ventricular size is normal.  3. A small pericardial effusion is present. Moderate pleural effusion.  4. The mitral valve is normal in structure. No evidence of  mitral valve regurgitation. No evidence of mitral stenosis.  5. Tricuspid valve regurgitation is mild to moderate.  6. The aortic valve is normal in structure. Aortic valve regurgitation is mild. Mild aortic valve stenosis.  7. The inferior vena cava is normal in size with greater than 50% respiratory variability, suggesting right atrial pressure of 3 mmHg. FINDINGS  Left  Ventricle: Left ventricular ejection fraction, by estimation, is 60 to 65%. The left ventricle has normal function. The left ventricle has no regional wall motion abnormalities. The left ventricular internal cavity size was normal in size. There is  moderate concentric left ventricular hypertrophy. Left ventricular diastolic parameters are consistent with Grade I diastolic dysfunction (impaired relaxation). Right Ventricle: The right ventricular size is normal. No increase in right ventricular wall thickness. Right ventricular systolic function is normal. Left Atrium: Left atrial size was normal in size. Right Atrium: Right atrial size was normal in size. Pericardium: A small pericardial effusion is present. Mitral Valve: The mitral valve is normal in structure. No evidence of mitral valve regurgitation. No evidence of mitral valve stenosis. Tricuspid Valve: The tricuspid valve is normal in structure. Tricuspid valve regurgitation is mild to moderate. No evidence of tricuspid stenosis. Aortic Valve: The aortic valve is normal in structure. Aortic valve regurgitation is mild. Aortic regurgitation PHT measures 508 msec. Mild aortic stenosis is present. Aortic valve mean gradient measures 9.8 mmHg. Aortic valve peak gradient measures 18.0  mmHg. Aortic valve area, by VTI measures 2.00 cm. Pulmonic Valve: The pulmonic valve was normal in structure. Pulmonic valve regurgitation is not visualized. No evidence of pulmonic stenosis. Aorta: The aortic root is normal in size and structure. Venous: The inferior vena cava is normal in size with greater than  50% respiratory variability, suggesting right atrial pressure of 3 mmHg. IAS/Shunts: No atrial level shunt detected by color flow Doppler. Additional Comments: There is a moderate pleural effusion.  LEFT VENTRICLE PLAX 2D LVIDd:         4.00 cm   Diastology LVIDs:         2.60 cm   LV e' medial:    7.07 cm/s LV PW:         1.80 cm   LV E/e' medial:  14.7 LV IVS:        1.50 cm   LV e' lateral:   9.36 cm/s LVOT diam:     2.00 cm   LV E/e' lateral: 11.1 LV SV:         71 LV SV Index:   48 LVOT Area:     3.14 cm  RIGHT VENTRICLE RV Basal diam:  2.70 cm RV Mid diam:    1.50 cm RV S prime:     11.60 cm/s TAPSE (M-mode): 1.7 cm LEFT ATRIUM             Index        RIGHT ATRIUM           Index LA diam:        4.60 cm 3.08 cm/m   RA Area:     16.40 cm LA Vol (A2C):   37.8 ml 25.30 ml/m  RA Volume:   42.20 ml  28.24 ml/m LA Vol (A4C):   70.1 ml 46.92 ml/m LA Biplane Vol: 55.7 ml 37.28 ml/m  AORTIC VALVE AV Area (Vmax):    1.66 cm AV Area (Vmean):   1.86 cm AV Area (VTI):     2.00 cm AV Vmax:           212.00 cm/s AV Vmean:          142.750 cm/s AV VTI:            0.356 m AV Peak Grad:      18.0 mmHg AV Mean Grad:      9.8 mmHg LVOT Vmax:  112.00 cm/s LVOT Vmean:        84.500 cm/s LVOT VTI:          0.226 m LVOT/AV VTI ratio: 0.64 AI PHT:            508 msec  AORTA Ao Root diam: 2.30 cm MITRAL VALVE                TRICUSPID VALVE MV Area (PHT): 2.88 cm     TR Peak grad:   36.0 mmHg MV Decel Time: 263 msec     TR Vmax:        300.00 cm/s MV E velocity: 104.00 cm/s MV A velocity: 33.00 cm/s   SHUNTS MV E/A ratio:  3.15         Systemic VTI:  0.23 m                             Systemic Diam: 2.00 cm Isaias Cowman MD Electronically signed by Isaias Cowman MD Signature Date/Time: 09/09/2022/7:58:45 AM    Final    DG Chest Port 1 View  Result Date: 09/08/2022 CLINICAL DATA:  Acute on chronic congestive heart failure. EXAM: PORTABLE CHEST 1 VIEW COMPARISON:  09/06/22 FINDINGS: Stable cardiomediastinal  contours. Increased volume of left pleural effusion. Now moderate to large. Small right pleural effusion is improved in the interval. Similar appearance of mild diffuse interstitial edema. IMPRESSION: 1. Increased volume of left pleural effusion. 2. Improved right pleural effusion. 3. Persistent mild interstitial edema. Electronically Signed   By: Kerby Moors M.D.   On: 09/08/2022 08:16     Medications:    furosemide (LASIX) 200 mg in dextrose 5 % 100 mL (2 mg/mL) infusion 8 mg/hr (09/09/22 1109)    apixaban  2.5 mg Oral BID   aspirin EC  81 mg Oral Daily   atorvastatin  10 mg Oral Daily   diltiazem  60 mg Oral Q12H   ezetimibe  10 mg Oral Daily   flecainide  100 mg Oral Q12H   insulin aspart  0-5 Units Subcutaneous QHS   insulin aspart  0-9 Units Subcutaneous TID WC   insulin detemir  8 Units Subcutaneous QHS   mycophenolate  500 mg Oral BID   potassium chloride SA  20 mEq Oral Daily   pregabalin  50 mg Oral BID   tacrolimus  3 mg Oral BID   acetaminophen **OR** acetaminophen, iohexol, labetalol, magnesium hydroxide, morphine injection, nitroGLYCERIN, ondansetron **OR** ondansetron (ZOFRAN) IV, traZODone  Assessment/ Plan:  Jean Johnson is a 72 y.o.  female  with past medical conditions including diabetes, atrial fibrillation on Eliquis, dyslipidemia, hypertension, end-stage renal disease status post renal transplant x 2, diabetic neuropathy, and PUD.  Patient presents to the emergency department with complaints of shortness of breath and chest pain.  Patient has been admitted for Acute on chronic diastolic heart failure (HCC) [I50.33] Acute on chronic diastolic (congestive) heart failure (HCC) [I50.33] Dyspnea, unspecified type [R06.00]   Acute Kidney Injury on chronic kidney disease stage IIIAt with baseline creatinine 1.27 and GFR of 45 on 08/18/22.  Acute kidney injury secondary to cardiorenal syndrome Chronic kidney disease is secondary to diabetes, hypertension and  CHF. No exposure to IV contrast. Furosemide drip started in ED. Will monitor renal function during diuresis. No acute need for dialysis and patient states she would not do dialysis again, if needed.   Lab Results  Component Value Date  CREATININE 1.83 (H) 09/09/2022   CREATININE 1.52 (H) 09/08/2022   CREATININE 1.29 (H) 09/07/2022    Intake/Output Summary (Last 24 hours) at 09/09/2022 1316 Last data filed at 09/09/2022 0930 Gross per 24 hour  Intake 490.06 ml  Output 0 ml  Net 490.06 ml   2. Anemia of chronic kidney disease  Lab Results  Component Value Date   HGB 11.1 (L) 09/08/2022    Hgb stable for this patient.  3. Secondary Hyperparathyroidism: with outpatient labs: PTH 141, phosphorus 3.2, calcium 9.7 on 08/18/22.   Lab Results  Component Value Date   CALCIUM 7.8 (L) 09/09/2022   CAION 1.25 08/30/2022   PHOS 3.7 09/08/2022    Calcium decreased but phosphorus stable. Admits to poor oral intake.   4. Diabetes mellitus type II with chronic kidney disease/renal manifestations: insulin/noninsulin dependent. Home regimen includes Humalog and levemir. Most recent hemoglobin A1c is 5.7 on 04/14/22.   5. Acute on Chronic diastolic heart failure. ECHO on 09/08/22 shows EF 60-65% with a grade 1 diastolic dysfunction, mild AVR with stenosis.   Placed on furosemide drip, will increase to '8mg'$  /hr  Will monitor daily weight and urine output.    LOS: [place] 3/1/20241:16 PM

## 2022-09-09 NOTE — Progress Notes (Signed)
Progress Note   Patient: Jean Johnson [id number] DOB: 04-17-1951 DOA: 09/06/2022     3 DOS: the patient was seen and examined on 09/09/2022   Brief Narrative:  72 year old African-American female with past medical history significant for ESRD s/p renal transplant x 2 now having CKD stage IIIb, hypertension, dyslipidemia, hypothyroidism, PAF on anticoagulation with Eliquis, PUD, type 2 diabetes mellitus with diabetic neuropathy as well as other comorbidities who presented to the ED with acute worsening of her dyspnea associate with chest discomfort that progressively worsened over the last few weeks but significantly worsened. She has had associated worsening lower extremity swelling and edema without pain as well as orthopnea.  She has had diminished urine output without dysuria urinary frequency or flank pain.  Denies any cough or hemoptysis or wheezing.  Has been having occasional palpitations and she feels that it is related to her proximal. Her D-dimer slightly elevated and a CTA of the chest was done to be ordered to evaluate for PE but given her renal function VQ scan was pursued instead and showed low probability for PE.     Assessment & Plan:   Principal Problem:   Acute on chronic diastolic (congestive) heart failure (HCC) Active Problems:   Hypertensive urgency   Chest pain   Elevated d-dimer   Renal transplant recipient   Dyslipidemia   Paroxysmal atrial fibrillation (HCC)   Type 2 diabetes mellitus with chronic kidney disease, with long-term current use of insulin (HCC)   Diabetic neuropathy (HCC)   Acute on chronic diastolic heart failure (HCC)   Acute on chronic diastolic congestive heart failure Presents with 2 weeks of chest tightness with only relieved by lying on her left side.   BNP elevated despite adherence to home Lasix 20 mg daily Remains clinically volume overloaded Plan: Lasix drip has been increased by nephrologist today ACE inhibitor on hold at this  time in the setting of increasing creatinine Strict I's and O's, low-salt diet, daily weights Echo October 2023 showed ejection fraction 60 to 65% with moderate MR   Hypertensive urgency Blood pressure improved control.   Continue Lasix drip   Paroxysmal atrial fibrillation with rapid ventricular response Ventricular rate have improved.  Currently 60s to 90s on telemetry. Plan: Continue Eliquis 5 mg twice daily Cardizem 120 mg twice daily Flecainide 100 mg twice daily Follow-up on echocardiogram   ESRD s/p renal transplant x 2 Chronic immunosuppression Still makes urine.  Followed by nephrology outpatient.  Baseline creatinine 1.29-1.33 Plan: Continue immunosuppressive regimen of CellCept and tacrolimus Check Prograf level Nephrology consulted, case discussed with Dr. Juleen China.     Elevated troponin Dyslipidemia Borderline elevation.  Flat trend Not indicative of ACS Continue PTA statin   Diabetic neuropathy (HCC) Continue with Pregabalin 50 mg p.o. twice daily   Type 2 diabetes mellitus with chronic kidney disease, with long-term current use of insulin (HCC) -C/w supplemental coverage with sensitive NovoLog sliding scale insulin AC and at bedtime and continue basal coverage with insulin detemir 8 units subcu nightly. -Continue to Monitor CBG's per Protocol; CBGs ranging from 73-127       DVT prophylaxis: Eliquis  Code Status: Full code  Family Communication: Plan of care discussed with patient daughter-in-law as well as husband at bedside  Disposition Plan: Status is: Inpatient  Remains inpatient appropriate because: Decompensated heart failure on IV diuresis     Level of care: Progressive   Consultants:  Cardiology- [facility]- Dr. Juleen China   Procedures:  None  Antimicrobials: None        Subjective: Seen and examined at bedside this morning She admits to improvement in respiratory function Denies chest pain, nausea vomiting or  abdominal pain   Physical Exam: Vitals:   09/09/22 0826 09/09/22 1137 09/09/22 1347 09/09/22 1524  BP: 134/76 (!) 136/50  (!) 148/60  Pulse: 66 64  73  Resp: '16 16  16  '$ Temp: 98.2 F (36.8 C) 98.3 F (36.8 C)  98.5 F (36.9 C)  TempSrc: Oral Oral  Oral  SpO2: 100% 99%  99%  Weight:   50.9 kg   Height:       General exam: NAD.  Appears frail and chronically Respiratory system: Diminished breath sounds bilaterally.   Cardiovascular system: S1-S2, regular rate, irregular rhythm, no murmurs, 1+ pitting edema BLE Gastrointestinal system: Soft, NT/ND, normal bowel sounds Central nervous system: Alert and oriented. No focal neurological deficits. Extremities: Symmetric 5 x 5 power. Skin: No rashes, lesions or ulcers Psychiatry: Judgement and insight appear normal. Mood & affect appropriate.     Data Reviewed:  CBC and BMP reviewed by me today    Disposition:   Planned Discharge Destination: Pending clinical course    Time spent: 50 minutes  Author: Verline Lema, MD 09/09/2022 5:10 PM  For on call review [email or web address].

## 2022-09-09 NOTE — Progress Notes (Addendum)
Pt has had no urine output overnight, bladder scanned pt this am, yield 116 cc. Per pt doesn't feel like she needs to go. Pt wheelchair bound at  baseline therefore unable to get out of bed. Dr. Rande Brunt on the floor and notified. No new orders received.Marland Kitchen

## 2022-09-09 NOTE — Progress Notes (Signed)
Hypoglycemic Event  CBG: 54  Treatment: 4 oz juice/soda  Symptoms: Vision changes   Follow-up CBG: Time: 9:11 am CBG Result:47  Possible Reasons for Event: Inadequate meal intake  Comments/MD notified: per MD give an amp of D50  F/u CBG at 10am. Jean Johnson

## 2022-09-10 ENCOUNTER — Encounter: Payer: Self-pay | Admitting: Family Medicine

## 2022-09-10 DIAGNOSIS — I5033 Acute on chronic diastolic (congestive) heart failure: Secondary | ICD-10-CM

## 2022-09-10 LAB — GLUCOSE, CAPILLARY
Glucose-Capillary: 117 mg/dL — ABNORMAL HIGH (ref 70–99)
Glucose-Capillary: 116 mg/dL — ABNORMAL HIGH (ref 70–99)
Glucose-Capillary: 123 mg/dL — ABNORMAL HIGH (ref 70–99)
Glucose-Capillary: 86 mg/dL (ref 70–99)
Glucose-Capillary: 94 mg/dL (ref 70–99)

## 2022-09-10 LAB — CBC WITH DIFFERENTIAL/PLATELET
Abs Immature Granulocytes: 0.04 10*3/uL (ref 0.00–0.07)
Basophils Absolute: 0 10*3/uL (ref 0.0–0.1)
Basophils Relative: 0 %
Eosinophils Absolute: 0.1 10*3/uL (ref 0.0–0.5)
Eosinophils Relative: 1 %
HCT: 33 % — ABNORMAL LOW (ref 36.0–46.0)
Hemoglobin: 10.1 g/dL — ABNORMAL LOW (ref 12.0–15.0)
Immature Granulocytes: 1 %
Lymphocytes Relative: 12 %
Lymphs Abs: 1.1 10*3/uL (ref 0.7–4.0)
MCH: 30.1 pg (ref 26.0–34.0)
MCHC: 30.6 g/dL (ref 30.0–36.0)
MCV: 98.5 fL (ref 80.0–100.0)
Monocytes Absolute: 0.9 10*3/uL (ref 0.1–1.0)
Monocytes Relative: 11 %
Neutro Abs: 6.5 10*3/uL (ref 1.7–7.7)
Neutrophils Relative %: 75 %
Platelets: 101 10*3/uL — ABNORMAL LOW (ref 150–400)
RBC: 3.35 MIL/uL — ABNORMAL LOW (ref 3.87–5.11)
RDW: 17.8 % — ABNORMAL HIGH (ref 11.5–15.5)
WBC: 8.6 10*3/uL (ref 4.0–10.5)
nRBC: 0 % (ref 0.0–0.2)

## 2022-09-10 LAB — BASIC METABOLIC PANEL
Anion gap: 8 (ref 5–15)
BUN: 44 mg/dL — ABNORMAL HIGH (ref 8–23)
CO2: 21 mmol/L — ABNORMAL LOW (ref 22–32)
Calcium: 8 mg/dL — ABNORMAL LOW (ref 8.9–10.3)
Chloride: 109 mmol/L (ref 98–111)
Creatinine, Ser: 2.05 mg/dL — ABNORMAL HIGH (ref 0.44–1.00)
GFR, Estimated: 25 mL/min — ABNORMAL LOW (ref >60)
Glucose, Bld: 151 mg/dL — ABNORMAL HIGH (ref 70–99)
Potassium: 3.8 mmol/L (ref 3.5–5.1)
Sodium: 138 mmol/L (ref 135–145)

## 2022-09-10 LAB — TACROLIMUS LEVEL: Tacrolimus (FK506) - LabCorp: 5.4 ng/mL (ref 2.0–20.0)

## 2022-09-10 MED ORDER — ORAL CARE MOUTH RINSE: 15.0000 mL | OROMUCOSAL | Status: DC | PRN

## 2022-09-10 MED ORDER — ORAL CARE MOUTH RINSE
15.0000 mL | OROMUCOSAL | Status: DC
  Administered 2022-09-10 – 2022-09-24 (×51): 15 mL via OROMUCOSAL

## 2022-09-10 NOTE — Progress Notes (Signed)
RN assisted with feeding pt.  Pt pocketing food in mouth and having difficulty swallowing.  SLP ordered.

## 2022-09-10 NOTE — Evaluation (Addendum)
Clinical/Bedside Swallow Evaluation Patient Details  Name: Jean Johnson MRN: [id number] Date of Birth: [birth year]  Today's Date: 09/10/2022 Time: SLP Start Time (ACUTE ONLY): 72 SLP Stop Time (ACUTE ONLY): 1220 SLP Time Calculation (min) (ACUTE ONLY): 50 min  Past Medical History:  Past Medical History:  Diagnosis Date   Anemia of chronic renal disease    Aortic stenosis, mild    a.) TTE on 11/23/2020 --> mean gradient 9.7 mmHg   Atrial fibrillation ([place])    a.) CHA2DS2VASc = 4 (age, sex, HTN, T2DM);  b.) rate/rhythm maintained on oral diltiazem + flecainide; chronically anticoagulated with apixaban   B12 deficiency    Cervical spinal stenosis    Diabetic neuropathy (HCC)    Diverticulosis    Dyspnea    ESRD (end stage renal disease) ([facility] [facility])    First degree AV block    Gastrointestinal ulcer    HLD (hyperlipidemia)    Hypertension    Hypothyroidism    Incomplete right bundle branch block (RBBB)    LAE (left atrial enlargement)    a.) TTE on 11/23/2020 --> moderate   Left thyroid nodule    a.) cervical MRI on 12/29/2020 --> measured "at least" 3.5 cm; incompletely imaged.   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   Valvular regurgitation    a.) TTE 11/23/2020 --> mild panvalvular regurgitation; b.) TTE 04/29/2022: mild MR   Past Surgical History:  Past Surgical History:  Procedure Laterality Date   A/V FISTULAGRAM Left    ANTERIOR CERVICAL  DECOMP/DISCECTOMY FUSION N/A 03/24/2021   Procedure: C3-6 ANTERIOR CERVICAL DECOMPRESSION/DISCECTOMY FUSION 3 LEVELS;  Surgeon: Meade Maw, MD;  Location: [facility];  Service: Neurosurgery;  Laterality: N/A;   ARTERY BIOPSY Right 08/30/2022   Procedure: BIOPSY TEMPORAL ARTERY;  Surgeon: Katha Cabal, MD;  Location: [facility];  Service: Vascular;  Laterality: Right;   CARPAL TUNNEL RELEASE Right 2016   CATARACT EXTRACTION W/ INTRAOCULAR LENS IMPLANT Right 03/2020   CATARACT EXTRACTION W/PHACO Left 10/21/2020   Procedure: CATARACT EXTRACTION PHACO AND INTRAOCULAR LENS PLACEMENT ([place]) DIABETES LEFT;  Surgeon: Leandrew Koyanagi, MD;  Location: [facility];  Service: Ophthalmology;  Laterality: Left;  2.84 0:48.8 5.8%   COLONOSCOPY WITH PROPOFOL N/A 09/03/2021   Procedure: COLONOSCOPY WITH PROPOFOL;  Surgeon: Lesly Rubenstein, MD;  Location: [facility];  Service: Endoscopy;  Laterality: N/A;  DM, ELIQUIS, WHEELCHAIR   EYE SURGERY Right 2020   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   Living donor organ recipient (sister)   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   Cadaveric organ recipient   HPI:  Pt is a 72 y.o. African-American female with medical history significant for end-stage renal disease s/p renal transplant x2, hypertension, dyslipidemia, hypothyroidism, paroxysmal atrial fibrillation on Eliquis, PUD, type 2 diabetes mellitus with diabetic neuropathy, more w/c-bound at home per husband and requires assistance w/ all ADLs who presented to the emergency room with acute onset of worsening dyspnea with associated chest pain which  has been progressively worse over the last week.  She describes her chest pain as tightness and rates it 10/10 in severity with associated nausea without vomiting or diaphoresis and without radiation.  She has been having associated worsening lower extremity edema without pain as well as orthopnea.  She has been having diminished urine output without  dysuria, urinary frequency, urgency or flank pain.  No cough or wheezing or hemoptysis.  CXR:  Increased volume of left pleural effusion.  2. Improved right pleural effusion.  3. Persistent mild interstitial edema.  MRI 08/2022: No acute intracranial abnormality.  2. Mild chronic small vessel ischemic disease, mildly progressed  from 2023.    Assessment / Plan / Recommendation  Clinical Impression   Pt seen for BSE today. Pt lying leaning to Left side in bed -- needed full positioning support; c/o discomfort around hip/butt on R side -- NSG aware per Husband present. Pt verbal but engaged only when stimulated to; paucity of speech. She helped to feed self given support, cues.  On RA; afebrile. WBC wnl.   Pt appears to present w/ grossly functional oropharyngeal phase swallowing w/ No pharyngeal phase dysphagia noted, No overt neuromuscular weaknesses noted. Pt consumed po trials w/ No immediate, overt clinical s/s of aspiration during po trials. Pt appears at reduced risk for aspiration following general aspiration precautions, support during meals w/ feeding, and slightly modified foods for conservation of energy(mech soft foods).  However, pt does have challenging factors that could impact her oropharyngeal swallowing to include discomfort from Wounds on R side(NSG aware), fatigue/weakness, weaker UE movements impacting self-feeding abilities, severely Deconditioned State overall, more of a chair/bed-bound status per Husband, and suspected impact of mild Cognitive decline possibly -- see last MRI results. ANY Cognitive decline can impact overall desire and attention for oral intake as well as awareness/timing of swallow and safety during po tasks which increases risk for aspiration, choking as well as decreased oral intake overall.   During po trials, pt consumed all consistencies w/ no overt coughing, decline in vocal quality, or change in respiratory presentation during/post trials. No decline in O2  sats. Oral phase appeared grossly [facility] w/ timely bolus management, mastication, and control of bolus propulsion for A-P transfer for swallowing. Oral clearing achieved w/ all trial consistencies when given SMALL cute, moistened/soft foods and Alternating b/t foods when foods were particulate in nature.  OM Exam appeared [facility] w/ no unilateral weakness noted. Speech Clear, low volume. Pt could Hold Cup to feed self her drink but need Full support w/ eating of foods -- unsure of WHY so deconditioned and debiliated to not be able to self-feed here and at home?   Recommend a more Mech Soft consistency diet w/ MINCED meats, moistened foods; Thin liquids -- this diet will still allow choices in her diet/Menu. Pt should hold Cup to drink liquids. ENcouragement to eat/drink at meals. Choose foods of preference. Recommend general aspiration precautions, support w/ feeding as needed. Pills CRUSHED in Puree for safer, easier swallowing -- this is Baseline for pt at home.   Education given on Pills in Puree; food consistencies/preferences and easy to eat options for conservation of energy; general aspiration precautions to pt and Husband present. NSG consulted and updated.  ST services will sign off at this time w/ MD to reconsult if any new needs arise. MD updated. Also recommended Dietician f/u for support and Palliative Care to discuss overall GOC in setting of Chronic health issues and multiple admissions to [place]  Visit Diagnosis: Dysphagia, unspecified (R13.10)    Aspiration Risk  Mild aspiration risk;Risk for inadequate nutrition/hydration (reduced following general aspiration precautions)    Diet Recommendation    a more Mech Soft consistency diet w/ MINCED meats, moistened foods; Thin liquids -- this diet will still allow choices in her diet/Menu. Pt should hold Cup to drink liquids. ENcouragement to eat/drink at meals. Choose foods of preference. Recommend general aspiration precautions, support w/  feeding as needed.  Medication Administration: Crushed with puree (baseline for pt, per husband)    Other  Recommendations Recommended Consults:  (Palliative Care; Dietician) Oral Care Recommendations: Oral care BID;Oral care before and after PO;Staff/trained caregiver to provide oral care    Recommendations for follow up therapy are one component of a multi-disciplinary discharge planning process, led by the attending physician.  Recommendations may be updated based on patient status, additional functional criteria and insurance authorization.  Follow up Recommendations Follow physician's recommendations for discharge plan and follow up therapies (appears at her baseline)      Assistance Recommended at Discharge  Full at meals  Functional Status Assessment Patient has had a recent decline in their functional status and demonstrates the ability to make significant improvements in function in a reasonable and predictable amount of time.  Frequency and Duration  (n/a)   (n/a)       Prognosis Prognosis for improved oropharyngeal function: Fair Barriers to Reach Goals: Cognitive deficits;Motivation;Time post onset;Severity of deficits;Behavior Barriers/Prognosis Comment: reduced attention to self; deconditioned/weak      Swallow Study   General Date of Onset: 09/06/22 HPI: Pt is a 72 y.o. African-American female with medical history significant for end-stage renal disease s/p renal transplant x2, hypertension, dyslipidemia, hypothyroidism, paroxysmal atrial fibrillation on Eliquis, PUD, type 2 diabetes mellitus with diabetic neuropathy, more w/c-bound at home per husband and requires assistance w/ all ADLs who presented to the emergency room with acute onset of worsening dyspnea with associated chest pain which has been progressively worse over the last week.  She describes her chest pain as tightness and rates it 10/10 in severity with associated nausea without vomiting or diaphoresis and  without radiation.  She has been having associated worsening lower extremity edema without pain as well as orthopnea.  She has been having diminished urine output without dysuria, urinary frequency, urgency or flank pain.  No cough or wheezing or hemoptysis.  CXR:  Increased volume of left pleural effusion.  2. Improved right pleural effusion.  3. Persistent mild interstitial edema.  MRI 08/2022: No acute intracranial abnormality.  2. Mild chronic small vessel ischemic disease, mildly progressed  from 2023. Type of Study: Bedside Swallow Evaluation Previous Swallow Assessment: 04/2022 - regular diet, thins Diet Prior to this Study: Regular;Thin liquids (Level 0) Temperature Spikes Noted: No (wbc 8.6) Respiratory Status: Room air History of Recent Intubation: No Behavior/Cognition: Alert;Cooperative;Pleasant mood;Distractible;Requires cueing (paucity of speech) Oral Cavity Assessment: Within Functional Limits Oral Care Completed by SLP: Recent completion by staff Oral Cavity - Dentition: Adequate natural dentition;Missing dentition (few) Vision: Functional for self-feeding Self-Feeding Abilities: Able to feed self;Needs assist;Needs set up (weak UEs) Patient Positioning: Postural control adequate for testing;Upright in bed (pt tends to lean onto her Left side for comfort -- at home also per husband) Baseline Vocal Quality: Low vocal intensity Volitional Cough: Strong Volitional Swallow: Able to elicit    Oral/Motor/Sensory Function Overall Oral Motor/Sensory Function: Within functional limits   Ice Chips Ice chips: Not tested   Thin Liquid Thin Liquid: Within functional  limits Presentation: Self Fed;Straw (10+ trials)    Nectar Thick Nectar Thick Liquid: Not tested   Honey Thick Honey Thick Liquid: Not tested   Puree Puree: Within functional limits Presentation: Spoon (fed; 5 trials)   Solid     Solid: Impaired (min) Presentation: Spoon (fed; 5 trials - along w/ multiple bites of  pancakes w/ the husband prior) Oral Phase Impairments: Poor awareness of bolus Oral Phase Functional Implications: Oral residue (slight, scattered) Pharyngeal Phase Impairments:  (none)        Orinda Kenner, MS, CCC-SLP Speech Language Pathologist Rehab Services; [place] [phone] (ascom) [place] 09/10/2022,1:50 PM

## 2022-09-10 NOTE — Progress Notes (Signed)
Progress Note   Patient: Jean Johnson [id number] DOB: [birth year] DOA: 09/06/2022     4 DOS: the patient was seen and examined on 09/10/2022    Subjective:  Seen and examined at bedside this morning Very lethargic Denies chest pain, nausea vomiting or abdominal pain Patient will benefit from palliative care input   Brief Narrative:  73 year old African-American female with past medical history significant for ESRD s/p renal transplant x 2 now having CKD stage IIIb, hypertension, dyslipidemia, hypothyroidism, PAF on anticoagulation with Eliquis, PUD, type 2 diabetes mellitus with diabetic neuropathy as well as other comorbidities who presented to the ED with acute worsening of her dyspnea associate with chest discomfort that progressively worsened over the last few weeks but significantly worsened. She has had associated worsening lower extremity swelling and edema without pain as well as orthopnea.  She has had diminished urine output without dysuria urinary frequency or flank pain.  Denies any cough or hemoptysis or wheezing.  Has been having occasional palpitations and she feels that it is related to her proximal. Her D-dimer slightly elevated and a CTA of the chest was done to be ordered to evaluate for PE but given her renal function VQ scan was pursued instead and showed low probability for PE.      Assessment & Plan:   Principal Problem:   Acute on chronic diastolic (congestive) heart failure (HCC) Active Problems:   Hypertensive urgency   Chest pain   Elevated d-dimer   Renal transplant recipient   Dyslipidemia   Paroxysmal atrial fibrillation (HCC)   Type 2 diabetes mellitus with chronic kidney disease, with long-term current use of insulin (HCC)   Diabetic neuropathy (HCC)   Acute on chronic diastolic heart failure (HCC)   Acute on chronic diastolic congestive heart failure Presents with 2 weeks of chest tightness with only relieved by lying on her left side.   BNP  elevated despite adherence to home Lasix 20 mg daily Remains clinically volume overloaded Plan: Lasix drip dose have been decreased given worsening renal function ACE inhibitor on hold at this time in the setting of increasing creatinine Strict I's and O's, low-salt diet, daily weights Echo October 2023 showed ejection fraction 60 to 65% with moderate MR   Hypertensive urgency Blood pressure improved control.   Continue Lasix drip   Paroxysmal atrial fibrillation with rapid ventricular response Ventricular rate have improved.  Currently 60s to 90s on telemetry. Plan: Continue Eliquis 5 mg twice daily Cardizem 120 mg twice daily Flecainide 100 mg twice daily Follow-up on echocardiogram   ESRD s/p renal transplant x 2 Chronic immunosuppression Acute on chronic renal failure likely multifactorial in the setting of diuresis as well as hepatorenal syndrome Still makes urine.  Followed by nephrology outpatient.  Baseline creatinine 1.29-1.33 Plan: Continue immunosuppressive regimen of CellCept and tacrolimus Nephrology consulted, case discussed with Dr. Juleen China.     Elevated troponin likely demand ischemia Dyslipidemia Borderline elevation.  Flat trend Not indicative of ACS Continue PTA statin   Diabetic neuropathy (HCC) Continue with Pregabalin 50 mg p.o. twice daily   Type 2 diabetes mellitus with chronic kidney disease, with long-term current use of insulin (HCC) -C/w supplemental coverage with sensitive NovoLog sliding scale insulin AC and at bedtime and continue basal coverage with insulin detemir 8 units subcu nightly. -Continue to Monitor CBG's per Protocol; CBGs ranging from 73-127       DVT prophylaxis: Eliquis   Code Status: Full code   Family Communication: Plan of care  discussed with patient's husband at bedside   Disposition Plan: Status is: Inpatient   Remains inpatient appropriate because: Decompensated heart failure on IV diuresis     Level of care:  Progressive   Consultants:  Cardiology- [facility]- Dr. Juleen China   Procedures:  None   Antimicrobials: None            Physical Exam: General: NAD, frail  Head: Normocephalic, atraumatic. Moist oral mucosal membranes  Eyes: Anicteric  Lungs:  Clear to auscultation, normal effort  Heart: Regular rate and rhythm  Abdomen:  Soft, nontender, nondistended  Extremities: 1+ peripheral edema.  Neurologic: Nonfocal, moving all four extremities  Skin: No lesions       Vitals:   09/10/22 0600 09/10/22 0839 09/10/22 1324 09/10/22 1550  BP:  (!) 170/67 (!) 110/58 129/62  Pulse:  89 68 84  Resp:  '15 16 20  '$ Temp:  100.1 F (37.8 C)  99.2 F (37.3 C)  TempSrc:  Oral  Oral  SpO2:  98% 98% 100%  Weight: 52.7 kg     Height:        Data Reviewed:  I have reviewed the patient's CBC and bMP   Time spent: 40 minutes  Author: Verline Lema, MD 09/10/2022 6:40 PM  For on call review [email or web address].

## 2022-09-10 NOTE — Consult Note (Signed)
WOC Nurse Consult Note: Reason for Consult: new sacral pressure injury Wound type: Stage 2 Pressure injury per nursing flow sheets Pressure Injury POA: Yes Measurement: 5cm x 4cm x 0cm per nursing notes Wound [id number], and clean Drainage (amount, consistency, odor) scant, serosanguinous  Periwound: intact  Dressing procedure/placement/frequency: Utilize skin care order set for Stage 2 Pressure Injury: change every 3 days; notify [place] nurse for acute changes.  Added chair pressure redistribution pad Added eval per PT for appropriate WC pressure redistribution cushion  Requested image from primary MD to be placed in the chart.    Re consult if needed, will not follow at this time. Thanks  [place] Doster [employer], RN,CWOCN, CNS, [place] [phone])

## 2022-09-10 NOTE — Progress Notes (Signed)
[facility]  ROUNDING NOTE   Subjective:   Jean Johnson is a 72 y.o. female with past medical conditions including diabetes, atrial fibrillation on Eliquis, dyslipidemia, hypertension, end-stage renal disease status post renal transplant x 2, diabetic neuropathy, and PUD.  Patient presents to the emergency department with complaints of shortness of breath and chest pain.  Patient has been admitted for Acute on chronic diastolic heart failure (HCC) [I50.33] Acute on chronic diastolic (congestive) heart failure (HCC) [I50.33] Dyspnea, unspecified type [R06.00]  Patient is known to our practice and is followed outpatient by Dr. Lanora Manis.  Patient was last seen in office on 08/23/2022 for routine follow-up.    Patient seen resting quietly, no family at bedside Patient alert and oriented when aroused Reports slowly improving appetite  Lower extremity edema slowly improving Remains on room air  Objective:  Vital signs in last 24 hours:  Temp:  [98.3 F (36.8 C)-100.1 F (37.8 C)] 100.1 F (37.8 C) (03/02 0839) Pulse Rate:  [56-89] 89 (03/02 0839) Resp:  [15-19] 15 (03/02 0839) BP: (131-170)/(50-73) 170/67 (03/02 0839) SpO2:  [91 %-99 %] 98 % (03/02 0839) Weight:  [50.9 kg-52.7 kg] 52.7 kg (03/02 0600)  Weight change:  Filed Weights   09/08/22 0500 09/09/22 1347 09/10/22 0600  Weight: 50.8 kg 50.9 kg 52.7 kg    Intake/Output: I/O last 3 completed shifts: In: 875.5 [P.O.:720; I.V.:55.5; IV Piggyback:100] Out: 1570 [Urine:1570]   Intake/Output this shift:  No intake/output data recorded.  Physical Exam: General: NAD, frail  Head: Normocephalic, atraumatic. Moist oral mucosal membranes  Eyes: Anicteric  Lungs:  Clear to auscultation, normal effort  Heart: Regular rate and rhythm  Abdomen:  Soft, nontender, nondistended  Extremities: 1+ peripheral edema.  Neurologic: Nonfocal, moving all four extremities  Skin: No lesions  Access: None    Basic  Metabolic Panel: Recent Labs  Lab 09/06/22 1603 09/06/22 2042 09/07/22 0316 09/08/22 0541 09/09/22 0556 09/10/22 0533  NA 142  --  140 142 141 138  K 3.6  --  3.8 3.8 3.7 3.8  CL 109  --  110 109 109 109  CO2 26  --  '24 24 23 '$ 21*  GLUCOSE 96  --  76 92 86 151*  BUN 38*  --  41* 41* 44* 44*  CREATININE 1.33*  --  1.29* 1.52* 1.83* 2.05*  CALCIUM 8.9  --  8.3* 8.2* 7.8* 8.0*  MG  --  2.1  --  1.8 1.9  --   PHOS  --   --   --  3.7  --   --      Liver Function Tests: Recent Labs  Lab 09/07/22 0316 09/08/22 0541  AST 13* 14*  ALT 8 7  ALKPHOS 60 57  BILITOT 0.8 0.7  PROT 4.3* 4.3*  ALBUMIN 2.1* 2.0*    No results for input(s): "LIPASE", "AMYLASE" in the last 168 hours. No results for input(s): "AMMONIA" in the last 168 hours.  CBC: Recent Labs  Lab 09/06/22 1603 09/07/22 0316 09/08/22 0541 09/10/22 0533  WBC 10.8* 10.4 11.2* 8.6  NEUTROABS  --   --  9.6* 6.5  HGB 13.4 11.2* 11.1* 10.1*  HCT 42.6 35.6* 35.4* 33.0*  MCV 97.0 97.8 97.5 98.5  PLT 138* 121* 105* 101*     Cardiac Enzymes: No results for input(s): "CKTOTAL", "CKMB", "CKMBINDEX", "TROPONINI" in the last 168 hours.  BNP: Invalid input(s): "POCBNP"  CBG: Recent Labs  Lab 09/09/22 1008 09/09/22 1216 09/09/22 1640  09/09/22 1946 09/10/22 0831  GLUCAP 120* 60* 158* 102* 117*     Microbiology: Results for orders placed or performed during the hospital encounter of 08/29/22  Surgical pcr screen     Status: None   Collection Time: 08/29/22 11:08 AM   Specimen: Nasal Mucosa; Nasal Swab  Result Value Ref Range Status   MRSA, PCR NEGATIVE NEGATIVE Final   Staphylococcus aureus NEGATIVE NEGATIVE Final    Comment: (NOTE) The Xpert SA Assay (FDA approved for NASAL specimens in patients 19 years of age and older), is one component of a comprehensive surveillance program. It is not intended to diagnose infection nor to guide or monitor treatment. Performed at [facility], [address] [place], [place] [address]     Coagulation Studies: No results for input(s): "LABPROT", "INR" in the last 72 hours.  Urinalysis: No results for input(s): "COLORURINE", "LABSPEC", "PHURINE", "GLUCOSEU", "HGBUR", "BILIRUBINUR", "KETONESUR", "PROTEINUR", "UROBILINOGEN", "NITRITE", "LEUKOCYTESUR" in the last 72 hours.  Invalid input(s): "APPERANCEUR"     Imaging: ECHOCARDIOGRAM COMPLETE  Result Date: 09/09/2022    ECHOCARDIOGRAM REPORT   Patient Name:   Jean Johnson Date of Exam: 09/08/2022 Medical Rec #:  [id number]           Height:       62.0 in Accession #:    [id number]          Weight:       112.0 lb Date of Birth:  04/22/1951           BSA:          1.494 m Patient Age:    20 years            BP:           139/67 mmHg Patient Gender: F                   HR:           68 bpm. Exam Location:  [facility] Procedure: 2D Echo, Cardiac Doppler and Color Doppler Indications:     CHF-acute diastolic [id number]  History:         Patient has prior history of Echocardiogram examinations, most                  recent 04/29/2022. Risk Factors:Hypertension and Dyslipidemia.  Sonographer:     Sherrie Sport Referring Phys:  [id number] [place] TANG Diagnosing Phys: Isaias Cowman MD IMPRESSIONS  1. Left ventricular ejection fraction, by estimation, is 60 to 65%. The left ventricle has normal function. The left ventricle has no regional wall motion abnormalities. There is moderate concentric left ventricular hypertrophy. Left ventricular diastolic parameters are consistent with Grade I diastolic dysfunction (impaired relaxation).  2. Right ventricular systolic function is normal. The right ventricular size is normal.  3. A small pericardial effusion is present. Moderate pleural effusion.  4. The mitral valve is normal in structure. No evidence of mitral valve regurgitation. No evidence of mitral stenosis.  5. Tricuspid valve regurgitation is mild to moderate.  6. The aortic valve is normal in structure. Aortic  valve regurgitation is mild. Mild aortic valve stenosis.  7. The inferior vena cava is normal in size with greater than 50% respiratory variability, suggesting right atrial pressure of 3 mmHg. FINDINGS  Left Ventricle: Left ventricular ejection fraction, by estimation, is 60 to 65%. The left ventricle has normal function. The left ventricle has no regional wall motion abnormalities. The left ventricular internal cavity  size was normal in size. There is  moderate concentric left ventricular hypertrophy. Left ventricular diastolic parameters are consistent with Grade I diastolic dysfunction (impaired relaxation). Right Ventricle: The right ventricular size is normal. No increase in right ventricular wall thickness. Right ventricular systolic function is normal. Left Atrium: Left atrial size was normal in size. Right Atrium: Right atrial size was normal in size. Pericardium: A small pericardial effusion is present. Mitral Valve: The mitral valve is normal in structure. No evidence of mitral valve regurgitation. No evidence of mitral valve stenosis. Tricuspid Valve: The tricuspid valve is normal in structure. Tricuspid valve regurgitation is mild to moderate. No evidence of tricuspid stenosis. Aortic Valve: The aortic valve is normal in structure. Aortic valve regurgitation is mild. Aortic regurgitation PHT measures 508 msec. Mild aortic stenosis is present. Aortic valve mean gradient measures 9.8 mmHg. Aortic valve peak gradient measures 18.0  mmHg. Aortic valve area, by VTI measures 2.00 cm. Pulmonic Valve: The pulmonic valve was normal in structure. Pulmonic valve regurgitation is not visualized. No evidence of pulmonic stenosis. Aorta: The aortic root is normal in size and structure. Venous: The inferior vena cava is normal in size with greater than 50% respiratory variability, suggesting right atrial pressure of 3 mmHg. IAS/Shunts: No atrial level shunt detected by color flow Doppler. Additional Comments: There is  a moderate pleural effusion.  LEFT VENTRICLE PLAX 2D LVIDd:         4.00 cm   Diastology LVIDs:         2.60 cm   LV e' medial:    7.07 cm/s LV PW:         1.80 cm   LV E/e' medial:  14.7 LV IVS:        1.50 cm   LV e' lateral:   9.36 cm/s LVOT diam:     2.00 cm   LV E/e' lateral: 11.1 LV SV:         71 LV SV Index:   48 LVOT Area:     3.14 cm  RIGHT VENTRICLE RV Basal diam:  2.70 cm RV Mid diam:    1.50 cm RV S prime:     11.60 cm/s TAPSE (M-mode): 1.7 cm LEFT ATRIUM             Index        RIGHT ATRIUM           Index LA diam:        4.60 cm 3.08 cm/m   RA Area:     16.40 cm LA Vol (A2C):   37.8 ml 25.30 ml/m  RA Volume:   42.20 ml  28.24 ml/m LA Vol (A4C):   70.1 ml 46.92 ml/m LA Biplane Vol: 55.7 ml 37.28 ml/m  AORTIC VALVE AV Area (Vmax):    1.66 cm AV Area (Vmean):   1.86 cm AV Area (VTI):     2.00 cm AV Vmax:           212.00 cm/s AV Vmean:          142.750 cm/s AV VTI:            0.356 m AV Peak Grad:      18.0 mmHg AV Mean Grad:      9.8 mmHg LVOT Vmax:         112.00 cm/s LVOT Vmean:        84.500 cm/s LVOT VTI:          0.226 m LVOT/AV  VTI ratio: 0.64 AI PHT:            508 msec  AORTA Ao Root diam: 2.30 cm MITRAL VALVE                TRICUSPID VALVE MV Area (PHT): 2.88 cm     TR Peak grad:   36.0 mmHg MV Decel Time: 263 msec     TR Vmax:        300.00 cm/s MV E velocity: 104.00 cm/s MV A velocity: 33.00 cm/s   SHUNTS MV E/A ratio:  3.15         Systemic VTI:  0.23 m                             Systemic Diam: 2.00 cm Isaias Cowman MD Electronically signed by Isaias Cowman MD Signature Date/Time: 09/09/2022/7:58:45 AM    Final      Medications:    furosemide (LASIX) 200 mg in dextrose 5 % 100 mL (2 mg/mL) infusion 6 mg/hr (09/10/22 0944)    apixaban  2.5 mg Oral BID   aspirin EC  81 mg Oral Daily   atorvastatin  10 mg Oral Daily   Chlorhexidine Gluconate Cloth  6 each Topical Q0600   diltiazem  60 mg Oral Q12H   ezetimibe  10 mg Oral Daily   flecainide  100 mg Oral Q12H    insulin aspart  0-5 Units Subcutaneous QHS   insulin aspart  0-9 Units Subcutaneous TID WC   mycophenolate  500 mg Oral BID   potassium chloride SA  20 mEq Oral Daily   pregabalin  50 mg Oral BID   tacrolimus  3 mg Oral BID   acetaminophen **OR** acetaminophen, iohexol, labetalol, magnesium hydroxide, morphine injection, nitroGLYCERIN, ondansetron **OR** ondansetron (ZOFRAN) IV, traZODone  Assessment/ Plan:  Jean Johnson is a 72 y.o.  female  with past medical conditions including diabetes, atrial fibrillation on Eliquis, dyslipidemia, hypertension, end-stage renal disease status post renal transplant x 2, diabetic neuropathy, and PUD.  Patient presents to the emergency department with complaints of shortness of breath and chest pain.  Patient has been admitted for Acute on chronic diastolic heart failure (HCC) [I50.33] Acute on chronic diastolic (congestive) heart failure (HCC) [I50.33] Dyspnea, unspecified type [R06.00]   Acute Kidney Injury on chronic kidney disease stage IIIAt with baseline creatinine 1.27 and GFR of 45 on 08/18/22.  Acute kidney injury secondary to cardiorenal syndrome Chronic kidney disease is secondary to diabetes, hypertension and CHF. No exposure to IV contrast. Furosemide drip started in ED.   Renal function slightly elevated, not unexpected with increased furosemide drip yesterday.  Adequate urine output noted at 1.5 L.  Continue tacrolimus and mycophenolate as ordered.  Tacrolimus level obtained this a.m.  Lab Results  Component Value Date   CREATININE 2.05 (H) 09/10/2022   CREATININE 1.83 (H) 09/09/2022   CREATININE 1.52 (H) 09/08/2022    Intake/Output Summary (Last 24 hours) at 09/10/2022 0948 Last data filed at 09/10/2022 0839 Gross per 24 hour  Intake 391.62 ml  Output 1570 ml  Net -1178.38 ml    2. Anemia of chronic kidney disease  Lab Results  Component Value Date   HGB 10.1 (L) 09/10/2022    Hemoglobin remains stable.  Will monitor  for now..  3. Secondary Hyperparathyroidism: with outpatient labs: PTH 141, phosphorus 3.2, calcium 9.7 on 08/18/22.   Lab Results  Component Value Date  CALCIUM 8.0 (L) 09/10/2022   CAION 1.25 08/30/2022   PHOS 3.7 09/08/2022    Calcium and phosphorus within desired range.  Will continue to monitor for now.  4. Diabetes mellitus type II with chronic kidney disease/renal manifestations: insulin/noninsulin dependent. Home regimen includes Humalog and levemir. Most recent hemoglobin A1c is 5.7 on 04/14/22.   Glucose well-controlled today.  Primary team to manage sliding scale insulin.  5. Acute on Chronic diastolic heart failure. ECHO on 09/08/22 shows EF 60-65% with a grade 1 diastolic dysfunction, mild AVR with stenosis.   Furosemide drip increased to 8 mg/h yesterday.  Edema improved and respiratory status stable.  Will decrease to furosemide 6 mg/h.    LOS: 4   3/2/20249:48 AM

## 2022-09-10 NOTE — Consult Note (Signed)
WOC Nurse Consult Note: Reason for Consult:sacral pressure injury Wound type: Stage 3 Pressure Injury Pressure Injury POA: Yes Measurement:5cm x 3cm x 0.5cm per nursing  Wound bed:100% pink, ruddy, clean Drainage (amount, consistency, odor) serosanguinous  Periwound:macerated  Dressing procedure/placement/frequency: Sacral wound; cleanse with NS, pat dry. Cut to fit silver hydrofiber (Aquacel Ag+) place over/pack in wound bed, top with foam. Change daily while inpatient Low air loss mattress bed for  moisture management and pressure redistribution Needs air mattress overlay at home if not currently in place, patient is reported to be bedbound now Need specially seating surface evaluation for power chair.  LSW to follow up with [facility] and family on needs  RD for wound supplementation for wound healing in patient with kidney disease.   Follow up with wound care center of patient's choice at [place]  Discussed POC with patient and bedside nurse.  Re consult if needed, will not follow at this time. Thanks  Mahaila Tischer [employer], RN,CWOCN, CNS, [place] [phone])

## 2022-09-10 NOTE — Progress Notes (Signed)
Spoke with pts sister in law, [place], about pt status.  Eyvonne Mechanic concerned about pt prognosis and goals.  Per Eyvonne Mechanic, pt does not have any living children (son deceased), has a living spouse, and has a sister living in [place], sister is in poor health.  Eyvonne Mechanic concerned about pts care at home and the ability for family to provide care upon discharge.  Eyvonne Mechanic stated that the pts spouse does not understand the complexities of the pts situation and health needs.  In addition, the pt does not have any healthcare planning documents completed.  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  In addition, pt having moments of confusion, requesting staff to speak with her son or contact him.  Eyvonne Mechanic stated that yesterday, the pt requested to be resuscitated if needed.  MD notified.

## 2022-09-10 NOTE — Progress Notes (Signed)
SUBJECTIVE: Jean Johnson is a 24yoF with a PMH of paroxysmal AF (flecainide, Eliquis), HFpEF (60-65%, moderate LVH, G1 DD, mild AS, mild-moderate TR 08/2022)  ESRD s/p renal transplants x2  (initially in 2000 but failed, second in 2008), DM2 who presented to [facility] ED 09/06/2022 with chest tightness, peripheral edema, and orthopnea.  Cardiology was consulted for assistance with her heart failure.     Vitals:   09/10/22 0336 09/10/22 0600 09/10/22 0839 09/10/22 1324  BP: 138/64  (!) 170/67 (!) 110/58  Pulse: 77  89 68  Resp: '18  15 16  '$ Temp: 98.7 F (37.1 C)  100.1 F (37.8 C)   TempSrc:   Oral   SpO2: 98%  98% 98%  Weight:  52.7 kg    Height:        Intake/Output Summary (Last 24 hours) at 09/10/2022 1351 Last data filed at 09/10/2022 1001 Gross per 24 hour  Intake 463.18 ml  Output 1570 ml  Net -1106.82 ml    LABS: Basic Metabolic Panel: Recent Labs    09/08/22 0541 09/09/22 0556 09/10/22 0533  NA 142 141 138  K 3.8 3.7 3.8  CL 109 109 109  CO2 24 23 21*  GLUCOSE 92 86 151*  BUN 41* 44* 44*  CREATININE 1.52* 1.83* 2.05*  CALCIUM 8.2* 7.8* 8.0*  MG 1.8 1.9  --   PHOS 3.7  --   --    Liver Function Tests: Recent Labs    09/08/22 0541  AST 14*  ALT 7  ALKPHOS 57  BILITOT 0.7  PROT 4.3*  ALBUMIN 2.0*   No results for input(s): "LIPASE", "AMYLASE" in the last 72 hours. CBC: Recent Labs    09/08/22 0541 09/10/22 0533  WBC 11.2* 8.6  NEUTROABS 9.6* 6.5  HGB 11.1* 10.1*  HCT 35.4* 33.0*  MCV 97.5 98.5  PLT 105* 101*   Cardiac Enzymes: No results for input(s): "CKTOTAL", "CKMB", "CKMBINDEX", "TROPONINI" in the last 72 hours. BNP: Invalid input(s): "POCBNP" D-Dimer: No results for input(s): "DDIMER" in the last 72 hours. Hemoglobin A1C: No results for input(s): "HGBA1C" in the last 72 hours. Fasting Lipid Panel: No results for input(s): "CHOL", "HDL", "LDLCALC", "TRIG", "CHOLHDL", "LDLDIRECT" in the last 72 hours. Thyroid Function Tests: No  results for input(s): "TSH", "T4TOTAL", "T3FREE", "THYROIDAB" in the last 72 hours.  Invalid input(s): "FREET3" Anemia Panel: Recent Labs    09/08/22 0541  VITAMINB12 186  FOLATE 9.1  FERRITIN 905*  TIBC 129*  IRON 20*  RETICCTPCT 1.0     PHYSICAL EXAM General: Well developed, well nourished, in no acute distress HEENT:  Normocephalic and atramatic Neck:  No JVD.  Lungs: Clear bilaterally to auscultation and percussion. Heart: HRRR . Normal S1 and S2 without gallops or murmurs.  Abdomen: Bowel sounds are positive, abdomen soft and non-tender  Msk:  Back normal, normal gait. Normal strength and tone for age. Extremities: No clubbing, cyanosis or edema.   Neuro: Alert and oriented X 3. Psych:  Good affect, responds appropriately  TELEMETRY: atrial fibrillation, HR 67 bpm  ASSESSMENT AND PLAN: Patient admitted to [facility] on 09/06/22 with chief complaint of chest tightness, peripheral edema, and orthopnea. Patient resting in bed sleeping. Patient continues to be in atrial fibrillation with rate control. Patient continues to diurese will on IV Lasix, will continue if Cr allows. Will continue to follow.   Principal Problem:   Acute on chronic diastolic (congestive) heart failure (HCC) Active Problems:   Renal transplant recipient  Dyslipidemia   Chest pain   Elevated d-dimer   Paroxysmal atrial fibrillation (HCC)   Type 2 diabetes mellitus with chronic kidney disease, with long-term current use of insulin (HCC)   Hypertensive urgency   Diabetic neuropathy (HCC)   Acute on chronic diastolic heart failure ([place])    Jean Blasdel, FNP-C 09/10/2022 1:51 PM

## 2022-09-11 DIAGNOSIS — I5033 Acute on chronic diastolic (congestive) heart failure: Secondary | ICD-10-CM | POA: Diagnosis not present

## 2022-09-11 LAB — BASIC METABOLIC PANEL
Anion gap: 7 (ref 5–15)
BUN: 43 mg/dL — ABNORMAL HIGH (ref 8–23)
CO2: 25 mmol/L (ref 22–32)
Calcium: 8 mg/dL — ABNORMAL LOW (ref 8.9–10.3)
Chloride: 108 mmol/L (ref 98–111)
Creatinine, Ser: 2.24 mg/dL — ABNORMAL HIGH (ref 0.44–1.00)
GFR, Estimated: 23 mL/min — ABNORMAL LOW (ref >60)
Glucose, Bld: 88 mg/dL (ref 70–99)
Potassium: 3.7 mmol/L (ref 3.5–5.1)
Sodium: 140 mmol/L (ref 135–145)

## 2022-09-11 LAB — CBC WITH DIFFERENTIAL/PLATELET
Abs Immature Granulocytes: 0.04 10*3/uL (ref 0.00–0.07)
Basophils Absolute: 0 10*3/uL (ref 0.0–0.1)
Basophils Relative: 0 %
Eosinophils Absolute: 0.1 10*3/uL (ref 0.0–0.5)
Eosinophils Relative: 1 %
HCT: 32.3 % — ABNORMAL LOW (ref 36.0–46.0)
Hemoglobin: 10.1 g/dL — ABNORMAL LOW (ref 12.0–15.0)
Immature Granulocytes: 1 %
Lymphocytes Relative: 14 %
Lymphs Abs: 1.2 10*3/uL (ref 0.7–4.0)
MCH: 30.7 pg (ref 26.0–34.0)
MCHC: 31.3 g/dL (ref 30.0–36.0)
MCV: 98.2 fL (ref 80.0–100.0)
Monocytes Absolute: 0.9 10*3/uL (ref 0.1–1.0)
Monocytes Relative: 11 %
Neutro Abs: 6.4 10*3/uL (ref 1.7–7.7)
Neutrophils Relative %: 73 %
Platelets: 110 10*3/uL — ABNORMAL LOW (ref 150–400)
RBC: 3.29 MIL/uL — ABNORMAL LOW (ref 3.87–5.11)
RDW: 17.4 % — ABNORMAL HIGH (ref 11.5–15.5)
WBC: 8.7 10*3/uL (ref 4.0–10.5)
nRBC: 0 % (ref 0.0–0.2)

## 2022-09-11 LAB — GLUCOSE, CAPILLARY
Glucose-Capillary: 78 mg/dL (ref 70–99)
Glucose-Capillary: 117 mg/dL — ABNORMAL HIGH (ref 70–99)
Glucose-Capillary: 103 mg/dL — ABNORMAL HIGH (ref 70–99)
Glucose-Capillary: 111 mg/dL — ABNORMAL HIGH (ref 70–99)

## 2022-09-11 LAB — TACROLIMUS LEVEL: Tacrolimus (FK506) - LabCorp: 17.1 ng/mL (ref 2.0–20.0)

## 2022-09-11 MED ORDER — B COMPLEX-C PO TABS
1.0000 | ORAL_TABLET | Freq: Every day | ORAL | Status: DC
  Administered 2022-09-11 – 2022-09-12 (×2): 1 via ORAL
  Filled 2022-09-11 (×2): qty 1

## 2022-09-11 MED ORDER — ENSURE ENLIVE PO LIQD
237.0000 mL | Freq: Three times a day (TID) | ORAL | Status: DC
  Administered 2022-09-11 – 2022-09-24 (×25): 237 mL via ORAL

## 2022-09-11 MED ORDER — FLECAINIDE ACETATE 50 MG PO TABS: 50.0000 mg | ORAL_TABLET | Freq: Two times a day (BID) | ORAL | Status: DC

## 2022-09-11 MED ORDER — AMIODARONE HCL 200 MG PO TABS
400.0000 mg | ORAL_TABLET | Freq: Two times a day (BID) | ORAL | Status: DC
  Administered 2022-09-11 – 2022-09-12 (×4): 400 mg via ORAL
  Filled 2022-09-11 (×4): qty 2

## 2022-09-11 NOTE — Progress Notes (Signed)
[facility]  ROUNDING NOTE   Subjective:   Jean Johnson is a 72 y.o. female with past medical conditions including diabetes, atrial fibrillation on Eliquis, dyslipidemia, hypertension, end-stage renal disease status post renal transplant x 2, diabetic neuropathy, and PUD.  Patient presents to the emergency department with complaints of shortness of breath and chest pain.  Patient has been admitted for Acute on chronic diastolic heart failure (HCC) [I50.33] Acute on chronic diastolic (congestive) heart failure (HCC) [I50.33] Dyspnea, unspecified type [R06.00]  Patient is known to our practice and is followed outpatient by Dr. Lanora Manis.  Patient was last seen in office on 08/23/2022 for routine follow-up.    Update Patient seen laying in bed, nurse at bedside Patient denies shortness of breath Denies pain or discomfort  Lower extremity edema continues to improve  Objective:  Vital signs in last 24 hours:  Temp:  [98.6 F (37 C)-99.4 F (37.4 C)] 98.7 F (37.1 C) (03/03 0832) Pulse Rate:  [68-88] 88 (03/03 0832) Resp:  [16-20] 18 (03/03 0832) BP: (110-133)/(50-66) 114/66 (03/03 0832) SpO2:  [95 %-100 %] 99 % (03/03 0832) Weight:  [50.4 kg] 50.4 kg (03/03 0334)  Weight change: -0.5 kg Filed Weights   09/09/22 1347 09/10/22 0600 09/11/22 0334  Weight: 50.9 kg 52.7 kg 50.4 kg    Intake/Output: I/O last 3 completed shifts: In: 594.6 [P.O.:480; I.V.:114.6] Out: 870 [Urine:870]   Intake/Output this shift:  Total I/O In: 60 [P.O.:60] Out: -   Physical Exam: General: NAD, frail  Head: Normocephalic, atraumatic. Moist oral mucosal membranes  Eyes: Anicteric  Lungs:  Clear to auscultation, normal effort  Heart: Regular rate and rhythm  Abdomen:  Soft, nontender, nondistended  Extremities: 1+ peripheral edema.  Neurologic: Nonfocal, moving all four extremities  Skin: No lesions  Access: None    Basic Metabolic Panel: Recent Labs  Lab 09/06/22 2042  09/07/22 0316 09/08/22 0541 09/09/22 0556 09/10/22 0533 09/11/22 0507  NA  --  140 142 141 138 140  K  --  3.8 3.8 3.7 3.8 3.7  CL  --  110 109 109 109 108  CO2  --  '24 24 23 '$ 21* 25  GLUCOSE  --  76 92 86 151* 88  BUN  --  41* 41* 44* 44* 43*  CREATININE  --  1.29* 1.52* 1.83* 2.05* 2.24*  CALCIUM  --  8.3* 8.2* 7.8* 8.0* 8.0*  MG 2.1  --  1.8 1.9  --   --   PHOS  --   --  3.7  --   --   --      Liver Function Tests: Recent Labs  Lab 09/07/22 0316 09/08/22 0541  AST 13* 14*  ALT 8 7  ALKPHOS 60 57  BILITOT 0.8 0.7  PROT 4.3* 4.3*  ALBUMIN 2.1* 2.0*    No results for input(s): "LIPASE", "AMYLASE" in the last 168 hours. No results for input(s): "AMMONIA" in the last 168 hours.  CBC: Recent Labs  Lab 09/06/22 1603 09/07/22 0316 09/08/22 0541 09/10/22 0533 09/11/22 0507  WBC 10.8* 10.4 11.2* 8.6 8.7  NEUTROABS  --   --  9.6* 6.5 6.4  HGB 13.4 11.2* 11.1* 10.1* 10.1*  HCT 42.6 35.6* 35.4* 33.0* 32.3*  MCV 97.0 97.8 97.5 98.5 98.2  PLT 138* 121* 105* 101* 110*     Cardiac Enzymes: No results for input(s): "CKTOTAL", "CKMB", "CKMBINDEX", "TROPONINI" in the last 168 hours.  BNP: Invalid input(s): "POCBNP"  CBG: Recent Labs  Lab 09/10/22 1125 09/10/22 1517 09/10/22 2103 09/10/22 2111 09/11/22 0831  GLUCAP 116* 123* 86 94 58     Microbiology: Results for orders placed or performed during the hospital encounter of 08/29/22  Surgical pcr screen     Status: None   Collection Time: 08/29/22 11:08 AM   Specimen: Nasal Mucosa; Nasal Swab  Result Value Ref Range Status   MRSA, PCR NEGATIVE NEGATIVE Final   Staphylococcus aureus NEGATIVE NEGATIVE Final    Comment: (NOTE) The Xpert SA Assay (FDA approved for NASAL specimens in patients 56 years of age and older), is one component of a comprehensive surveillance program. It is not intended to diagnose infection nor to guide or monitor treatment. Performed at [facility], [address]  [place], [place] [address]     Coagulation Studies: No results for input(s): "LABPROT", "INR" in the last 72 hours.  Urinalysis: No results for input(s): "COLORURINE", "LABSPEC", "PHURINE", "GLUCOSEU", "HGBUR", "BILIRUBINUR", "KETONESUR", "PROTEINUR", "UROBILINOGEN", "NITRITE", "LEUKOCYTESUR" in the last 72 hours.  Invalid input(s): "APPERANCEUR"     Imaging: No results found.   Medications:    furosemide (LASIX) 200 mg in dextrose 5 % 100 mL (2 mg/mL) infusion 6 mg/hr (09/11/22 0304)    apixaban  2.5 mg Oral BID   aspirin EC  81 mg Oral Daily   atorvastatin  10 mg Oral Daily   Chlorhexidine Gluconate Cloth  6 each Topical Q0600   diltiazem  60 mg Oral Q12H   ezetimibe  10 mg Oral Daily   flecainide  100 mg Oral Q12H   insulin aspart  0-5 Units Subcutaneous QHS   insulin aspart  0-9 Units Subcutaneous TID WC   mycophenolate  500 mg Oral BID   mouth rinse  15 mL Mouth Rinse 4 times per day   potassium chloride SA  20 mEq Oral Daily   pregabalin  50 mg Oral BID   tacrolimus  3 mg Oral BID   acetaminophen **OR** acetaminophen, iohexol, labetalol, magnesium hydroxide, morphine injection, nitroGLYCERIN, ondansetron **OR** ondansetron (ZOFRAN) IV, mouth rinse, traZODone  Assessment/ Plan:  Jean Johnson is a 72 y.o.  female  with past medical conditions including diabetes, atrial fibrillation on Eliquis, dyslipidemia, hypertension, end-stage renal disease status post renal transplant x 2, diabetic neuropathy, and PUD.  Patient presents to the emergency department with complaints of shortness of breath and chest pain.  Patient has been admitted for Acute on chronic diastolic heart failure (HCC) [I50.33] Acute on chronic diastolic (congestive) heart failure (HCC) [I50.33] Dyspnea, unspecified type [R06.00]   Acute Kidney Injury on chronic kidney disease stage IIIAt with baseline creatinine 1.27 and GFR of 45 on 08/18/22.  Acute kidney injury secondary to cardiorenal  syndrome Chronic kidney disease is secondary to diabetes, hypertension and CHF. No exposure to IV contrast. Furosemide drip started in ED.   Creatinine slightly elevated today also.  Questionable urine output recorded.  Encouraged nurse to document all output.  Will continue furosemide drip at same rate today.  Lab Results  Component Value Date   CREATININE 2.24 (H) 09/11/2022   CREATININE 2.05 (H) 09/10/2022   CREATININE 1.83 (H) 09/09/2022    Intake/Output Summary (Last 24 hours) at 09/11/2022 1059 Last data filed at 09/11/2022 0939 Gross per 24 hour  Intake 351.14 ml  Output 350 ml  Net 1.14 ml    2. Anemia of chronic kidney disease  Lab Results  Component Value Date   HGB 10.1 (L) 09/11/2022  Hemoglobin within optimal range.  3. Secondary Hyperparathyroidism: with outpatient labs: PTH 141, phosphorus 3.2, calcium 9.7 on 08/18/22.   Lab Results  Component Value Date   CALCIUM 8.0 (L) 09/11/2022   CAION 1.25 08/30/2022   PHOS 3.7 09/08/2022    Will continue to monitor bone minerals during this admission. 4. Diabetes mellitus type II with chronic kidney disease/renal manifestations: insulin/noninsulin dependent. Home regimen includes Humalog and levemir. Most recent hemoglobin A1c is 5.7 on 04/14/22.   Primary team to manage sliding scale insulin.  5. Acute on Chronic diastolic heart failure. ECHO on 09/08/22 shows EF 60-65% with a grade 1 diastolic dysfunction, mild AVR with stenosis.   Will continue furosemide drip at prescribed rate.  Volume status appears improved and may consider transition to oral diuresis on Monday.    LOS: 5   3/3/202410:59 AM

## 2022-09-11 NOTE — Progress Notes (Signed)
Progress Note   Patient: Jean Johnson [id number] DOB: 04/19/1951 DOA: 09/06/2022     5 DOS: the patient was seen and examined on 09/11/2022   Subjective:  Seen and examined at bedside this morning Continues to be lethargic Denies chest pain, nausea vomiting or abdominal pain Patient will benefit from palliative care input since they are not here over the weekend we will talk to them tomorrow     Brief Narrative:  72 year old African-American female with past medical history significant for ESRD s/p renal transplant x 2 now having CKD stage IIIb, hypertension, dyslipidemia, hypothyroidism, PAF on anticoagulation with Eliquis, PUD, type 2 diabetes mellitus with diabetic neuropathy as well as other comorbidities who presented to the ED with acute worsening of her dyspnea associate with chest discomfort that progressively worsened over the last few weeks but significantly worsened. She has had associated worsening lower extremity swelling and edema without pain as well as orthopnea.  She has had diminished urine output without dysuria urinary frequency or flank pain.  Denies any cough or hemoptysis or wheezing.  Has been having occasional palpitations and she feels that it is related to her proximal. Her D-dimer slightly elevated and a CTA of the chest was done to be ordered to evaluate for PE but given her renal function VQ scan was pursued instead and showed low probability for PE.      Assessment & Plan:   Principal Problem:   Acute on chronic diastolic (congestive) heart failure (HCC) Active Problems:   Hypertensive urgency   Chest pain   Elevated d-dimer   Renal transplant recipient   Dyslipidemia   Paroxysmal atrial fibrillation (HCC)   Type 2 diabetes mellitus with chronic kidney disease, with long-term current use of insulin (HCC)   Diabetic neuropathy (HCC)   Acute on chronic diastolic heart failure (HCC)   Acute on chronic diastolic congestive heart failure Presents with  2 weeks of chest tightness with only relieved by lying on her left side.   BNP elevated despite adherence to home Lasix 20 mg daily Remains clinically volume overloaded Plan: Lasix drip dose have been decreased given worsening renal function ACE inhibitor on hold at this time in the setting of increasing creatinine Strict I's and O's, low-salt diet, daily weights Echo October 2023 showed ejection fraction 60 to 65% with moderate MR   Hypertensive urgency Blood pressure improved control.   Patient still on Lasix drip cardiology nephrologist recommendation   Paroxysmal atrial fibrillation with rapid ventricular response Ventricular rate have improved.  Currently 60s to 90s on telemetry. Plan: Continue Eliquis 5 mg twice daily Cardizem 120 mg twice daily Given worsening renal function flecainide have been switched to amiodarone    ESRD s/p renal transplant x 2 Chronic immunosuppression Acute on chronic renal failure likely multifactorial in the setting of diuresis as well as hepatorenal syndrome Still makes urine.  Followed by nephrology outpatient.  Baseline creatinine 1.29-1.33 Plan: Continue immunosuppressive regimen of CellCept and tacrolimus Nephrology on board we appreciate recommendations   Elevated troponin likely demand ischemia Dyslipidemia Borderline elevation.  Flat trend Not indicative of ACS Continue continue aspirin and statin therapy   Diabetic neuropathy (HCC) Continue with Pregabalin 50 mg p.o. twice daily   Type 2 diabetes mellitus with chronic kidney disease, with long-term current use of insulin (HCC) -C/w supplemental coverage with sensitive NovoLog sliding scale insulin AC and at bedtime and continue basal coverage with insulin detemir 8 units subcu nightly. -Continue to Monitor CBG's per Protocol; CBGs  ranging from 73-127       DVT prophylaxis: Eliquis   Code Status: Full code   Family Communication: Plan of care discussed with patient's husband at  bedside   Disposition Plan: Status is: Inpatient   Remains inpatient appropriate because: Decompensated heart failure on IV diuresis     Level of care: Progressive   Consultants:  Cardiology- [facility]- Dr. Juleen China   Procedures:  None     Physical Exam: General: NAD, frail  Head: Normocephalic, atraumatic. Moist oral mucosal membranes  Eyes: Anicteric  Lungs:  Clear to auscultation, normal effort  Heart: Regular rate and rhythm  Abdomen:  Soft, nontender, nondistended  Extremities: 1+ peripheral edema.  Neurologic: Nonfocal, moving all four extremities  Skin: No lesions        Data Reviewed:   I have reviewed the patient's CBC and bMP   Time spent: 35 minutes      Author: Verline Lema, MD 09/11/2022 4:41 PM  For on call review [email or web address].

## 2022-09-11 NOTE — Progress Notes (Signed)
SUBJECTIVE: Patient denies any chest pain shortness of breath or palpitation.  Vitals:   09/11/22 0300 09/11/22 0334 09/11/22 0334 09/11/22 0832  BP:   133/66 114/66  Pulse:   80 88  Resp:   16 18  Temp:   98.6 F (37 C) 98.7 F (37.1 C)  TempSrc: Oral Oral  Oral  SpO2:   98% 99%  Weight:   50.4 kg   Height:        Intake/Output Summary (Last 24 hours) at 09/11/2022 1253 Last data filed at 09/11/2022 [id number] Gross per 24 hour  Intake 345.14 ml  Output 350 ml  Net -4.86 ml    LABS: Basic Metabolic Panel: Recent Labs    09/09/22 0556 09/10/22 0533 09/11/22 0507  NA 141 138 140  K 3.7 3.8 3.7  CL 109 109 108  CO2 23 21* 25  GLUCOSE 86 151* 88  BUN 44* 44* 43*  CREATININE 1.83* 2.05* 2.24*  CALCIUM 7.8* 8.0* 8.0*  MG 1.9  --   --    Liver Function Tests: No results for input(s): "AST", "ALT", "ALKPHOS", "BILITOT", "PROT", "ALBUMIN" in the last 72 hours. No results for input(s): "LIPASE", "AMYLASE" in the last 72 hours. CBC: Recent Labs    09/10/22 0533 09/11/22 0507  WBC 8.6 8.7  NEUTROABS 6.5 6.4  HGB 10.1* 10.1*  HCT 33.0* 32.3*  MCV 98.5 98.2  PLT 101* 110*   Cardiac Enzymes: No results for input(s): "CKTOTAL", "CKMB", "CKMBINDEX", "TROPONINI" in the last 72 hours. BNP: Invalid input(s): "POCBNP" D-Dimer: No results for input(s): "DDIMER" in the last 72 hours. Hemoglobin A1C: No results for input(s): "HGBA1C" in the last 72 hours. Fasting Lipid Panel: No results for input(s): "CHOL", "HDL", "LDLCALC", "TRIG", "CHOLHDL", "LDLDIRECT" in the last 72 hours. Thyroid Function Tests: No results for input(s): "TSH", "T4TOTAL", "T3FREE", "THYROIDAB" in the last 72 hours.  Invalid input(s): "FREET3" Anemia Panel: No results for input(s): "VITAMINB12", "FOLATE", "FERRITIN", "TIBC", "IRON", "RETICCTPCT" in the last 72 hours.   PHYSICAL EXAM General: Well developed, well nourished, in no acute distress HEENT:  Normocephalic and atramatic Neck:  No JVD.  Lungs:  Clear bilaterally to auscultation and percussion. Heart: HRRR . Normal S1 and S2 without gallops or murmurs.  Abdomen: Bowel sounds are positive, abdomen soft and non-tender  Msk:  Back normal, normal gait. Normal strength and tone for age. Extremities: No clubbing, cyanosis or edema.   Neuro: Alert and oriented X 3. Psych:  Good affect, responds appropriately  TELEMETRY: Atrial fibrillation with controlled ventricular rate  ASSESSMENT AND PLAN: Atrial fibrillation with controlled ventricular rate and HFpEF with last echocardiogram showing normal ejection fraction.  Creatinine clearance is 35 now with creatinine over 2 with baseline around 1.38.  Patient was taking flecainide 100 twice daily and has been switched to 50 twice daily because of creatinine clearance getting worse after Lasix drip.  It might be wise to switch flecainide to IV amiodarone or p.o. amiodarone as patient still in atrial fibrillation.   ICD-10-CM   1. Dyspnea, unspecified type  R06.00     2. Acute on chronic diastolic heart failure (HCC)  I50.33       Principal Problem:   Acute on chronic diastolic (congestive) heart failure (HCC) Active Problems:   Renal transplant recipient   Dyslipidemia   Chest pain   Elevated d-dimer   Paroxysmal atrial fibrillation ([place])   Type 2 diabetes mellitus with chronic kidney disease, with long-term current use of insulin ([place])   Hypertensive  urgency   Diabetic neuropathy ([place])   Acute on chronic diastolic heart failure [facility])    Neoma Laming, MD, [facility] 09/11/2022 12:53 PM

## 2022-09-11 NOTE — Progress Notes (Addendum)
Initial Nutrition Assessment  INTERVENTION:   -Ensure Plus High Protein po TID, each supplement provides 350 kcal and 20 grams of protein.   -B complex w/ Vitamin C daily  -Consider bowel regimen as last BM was 2/26 per documentation   NUTRITION DIAGNOSIS:   Increased nutrient needs related to wound healing as evidenced by estimated needs.  GOAL:   Patient will meet greater than or equal to 90% of their needs  MONITOR:   PO intake, Supplement acceptance, Labs, Weight trends, I & O's, Skin  REASON FOR ASSESSMENT:   Consult Wound healing  ASSESSMENT:   72 y.o. female with past medical conditions including diabetes, atrial fibrillation on Eliquis, dyslipidemia, hypertension, end-stage renal disease status post renal transplant x 2, diabetic neuropathy, and PUD.  Patient presents to the emergency department with complaints of shortness of breath and chest pain.  Patient has been admitted for Acute on chronic diastolic heart failure.  Patient currently consuming bites of meals, requiring full feeding assistance. RNs have noticed patient pocketing food as well. SLP evaluated 3/2, recommended dysphagia 3 diet, allowed to have minced meats with gravies, baked potatoes  and butters. Given poor PO and stage 3 pressure injury, will order Ensure supplements and B complex w Vitamin C.   Admission weight 2/27: 103 lbs Current weight 3/3: 111 lbs  Per nursing documentation, pt with moderate BLE edema.   Medications: KLOR-CON, Lasix  Labs reviewed: CBGs: 78-123  NUTRITION - FOCUSED PHYSICAL EXAM:  Unable to complete, RD working remotely.   Diet Order:   Diet Order             DIET DYS 3 Room service appropriate? Yes with Assist; Fluid consistency: Thin  Diet effective now                   EDUCATION NEEDS:   Not appropriate for education at this time  Skin:  Skin Assessment: Skin Integrity Issues: Skin Integrity Issues:: Stage III Stage III: sacrum  Last BM:   2/26  Height:   Ht Readings from Last 1 Encounters:  09/06/22 '5\' 2"'$  (1.575 m)    Weight:   Wt Readings from Last 1 Encounters:  09/11/22 50.4 kg    BMI:  Body mass index is 20.32 kg/m.  Estimated Nutritional Needs:   Kcal:  1550-1750  Protein:  75-85g  Fluid:  1.7L/day  Clayton Bibles, MS, RD, LDN Inpatient Clinical Dietitian Contact information available via Amion

## 2022-09-12 DIAGNOSIS — I5033 Acute on chronic diastolic (congestive) heart failure: Secondary | ICD-10-CM | POA: Diagnosis not present

## 2022-09-12 LAB — CBC WITH DIFFERENTIAL/PLATELET
Abs Immature Granulocytes: 0.05 10*3/uL (ref 0.00–0.07)
Basophils Absolute: 0 10*3/uL (ref 0.0–0.1)
Basophils Relative: 0 %
Eosinophils Absolute: 0.1 10*3/uL (ref 0.0–0.5)
Eosinophils Relative: 1 %
HCT: 31.7 % — ABNORMAL LOW (ref 36.0–46.0)
Hemoglobin: 9.9 g/dL — ABNORMAL LOW (ref 12.0–15.0)
Immature Granulocytes: 1 %
Lymphocytes Relative: 15 %
Lymphs Abs: 1.4 10*3/uL (ref 0.7–4.0)
MCH: 30.6 pg (ref 26.0–34.0)
MCHC: 31.2 g/dL (ref 30.0–36.0)
MCV: 97.8 fL (ref 80.0–100.0)
Monocytes Absolute: 1 10*3/uL (ref 0.1–1.0)
Monocytes Relative: 11 %
Neutro Abs: 6.9 10*3/uL (ref 1.7–7.7)
Neutrophils Relative %: 72 %
Platelets: 120 10*3/uL — ABNORMAL LOW (ref 150–400)
RBC: 3.24 MIL/uL — ABNORMAL LOW (ref 3.87–5.11)
RDW: 17.4 % — ABNORMAL HIGH (ref 11.5–15.5)
WBC: 9.5 10*3/uL (ref 4.0–10.5)
nRBC: 0 % (ref 0.0–0.2)

## 2022-09-12 LAB — BASIC METABOLIC PANEL
Anion gap: 10 (ref 5–15)
BUN: 44 mg/dL — ABNORMAL HIGH (ref 8–23)
CO2: 24 mmol/L (ref 22–32)
Calcium: 8 mg/dL — ABNORMAL LOW (ref 8.9–10.3)
Chloride: 107 mmol/L (ref 98–111)
Creatinine, Ser: 2.44 mg/dL — ABNORMAL HIGH (ref 0.44–1.00)
GFR, Estimated: 20 mL/min — ABNORMAL LOW (ref >60)
Glucose, Bld: 97 mg/dL (ref 70–99)
Potassium: 4.2 mmol/L (ref 3.5–5.1)
Sodium: 141 mmol/L (ref 135–145)

## 2022-09-12 LAB — GLUCOSE, CAPILLARY
Glucose-Capillary: 140 mg/dL — ABNORMAL HIGH (ref 70–99)
Glucose-Capillary: 112 mg/dL — ABNORMAL HIGH (ref 70–99)
Glucose-Capillary: 71 mg/dL (ref 70–99)

## 2022-09-12 MED ORDER — ZINC SULFATE 220 (50 ZN) MG PO CAPS
220.0000 mg | ORAL_CAPSULE | Freq: Every day | ORAL | Status: DC
  Administered 2022-09-12 – 2022-09-24 (×13): 220 mg via ORAL
  Filled 2022-09-12 (×13): qty 1

## 2022-09-12 MED ORDER — VITAMIN C 500 MG PO TABS
500.0000 mg | ORAL_TABLET | Freq: Two times a day (BID) | ORAL | Status: DC
  Administered 2022-09-12 – 2022-09-24 (×25): 500 mg via ORAL
  Filled 2022-09-12 (×25): qty 1

## 2022-09-12 MED ORDER — ADULT MULTIVITAMIN W/MINERALS CH
1.0000 | ORAL_TABLET | Freq: Every day | ORAL | Status: DC
  Administered 2022-09-12 – 2022-09-24 (×13): 1 via ORAL
  Filled 2022-09-12 (×13): qty 1

## 2022-09-12 MED ORDER — FUROSEMIDE 40 MG PO TABS
80.0000 mg | ORAL_TABLET | Freq: Every day | ORAL | Status: DC
  Administered 2022-09-12: 80 mg via ORAL
  Filled 2022-09-12 (×2): qty 2

## 2022-09-12 NOTE — Plan of Care (Signed)

## 2022-09-12 NOTE — Progress Notes (Signed)
Occupational Therapy Treatment Patient Details Name: Jean Johnson MRN: [id number] DOB: [birth year] Today's Date: 09/12/2022   History of present illness Jean Johnson is a 72 y.o. African-American female with medical history significant for end-stage renal disease s/p renal transplant x2, hypertension, dyslipidemia, hypothyroidism, paroxysmal atrial fibrillation on Eliquis, PUD, type 2 diabetes mellitus with diabetic neuropathy, who presented to the emergency room with acute onset of worsening dyspnea with associated chest pain which has been progressively worse over the last week.   OT comments  Jean Johnson was seen for OT treatment on this date. Upon arrival to room pt reclined in bed, agreeable to tx. Pt requires MAX A self-feeding/drinking at bed level, pt assists with holding cup and wiping face. MAX A sup<>sit, poor sitting balance/tolerance however completes x6 seated B knee extension exercises with no increased pain. Pt making fair progress toward goals, will continue to follow POC. Discharge recommendation remains appropriate.     Recommendations for follow up therapy are one component of a multi-disciplinary discharge planning process, led by the attending physician.  Recommendations may be updated based on patient status, additional functional criteria and insurance authorization.    Follow Up Recommendations  Home health OT (pt near baseline)     Assistance Recommended at Discharge Frequent or constant Supervision/Assistance  Patient can return home with the following  Two people to help with walking and/or transfers;A lot of help with bathing/dressing/bathroom   Equipment Recommendations  Hospital bed    Recommendations for Other Services      Precautions / Restrictions Precautions Precautions: Fall Restrictions Weight Bearing Restrictions: No       Mobility Bed Mobility Overal bed mobility: Needs Assistance Bed Mobility: Supine to Sit, Sit to Supine      Supine to sit: Max assist, HOB elevated Sit to supine: Max assist, HOB elevated        Transfers                   General transfer comment: pt deferred citing fatigue, poor sitting tolerance     Balance Overall balance assessment: Needs assistance Sitting-balance support: Feet unsupported, Bilateral upper extremity supported Sitting balance-Leahy Scale: Poor Sitting balance - Comments: MIN A to correct L lateral lean                                   ADL either performed or assessed with clinical judgement   ADL Overall ADL's : Needs assistance/impaired                                       General ADL Comments: MAX A self-feeding/drinking at bed level. MAX A don B socks seated EOB      Cognition Arousal/Alertness: Awake/alert Behavior During Therapy: WFL for tasks assessed/performed Overall Cognitive Status: Impaired/Different from baseline Area of Impairment: Problem solving                             Problem Solving: Slow processing General Comments: pt asking intermittently where she is / how she got here         Pertinent Vitals/ Pain       Pain Assessment Pain Assessment: Faces Faces Pain Scale: Hurts even more Pain Location: sacral pain/ wound pain Pain Descriptors / Indicators: Aching,  Discomfort, Constant Pain Intervention(s): Limited activity within patient's tolerance, Repositioned   Frequency  Min 2X/week        Progress Toward Goals  OT Goals(current goals can now be found in the care plan section)  Progress towards OT goals: Progressing toward goals  Acute Rehab OT Goals Patient Stated Goal: go home OT Goal Formulation: With patient/family Time For Goal Achievement: 09/21/22 Potential to Achieve Goals: Good ADL Goals Pt Will Perform Grooming: with min assist;sitting Pt Will Perform Lower Body Dressing: with mod assist;sitting/lateral leans Pt Will Transfer to Toilet: with min  assist Pt Will Perform Toileting - Clothing Manipulation and hygiene: with mod assist  Plan Discharge plan remains appropriate;Frequency remains appropriate    Co-evaluation                 AM-PAC OT "6 Clicks" Daily Activity     Outcome Measure   Help from another person eating meals?: A Lot Help from another person taking care of personal grooming?: A Little Help from another person toileting, which includes using toliet, bedpan, or urinal?: Total Help from another person bathing (including washing, rinsing, drying)?: Total Help from another person to put on and taking off regular upper body clothing?: A Lot Help from another person to put on and taking off regular lower body clothing?: A Lot 6 Click Score: 11    End of Session    OT Visit Diagnosis: Unsteadiness on feet (R26.81);Muscle weakness (generalized) (M62.81)   Activity Tolerance Patient limited by fatigue   Patient Left in bed;with call bell/phone within reach;with bed alarm set;with family/visitor present   Nurse Communication          Time: 0912-0930 OT Time Calculation (min): 18 min  Charges: OT General Charges $OT Visit: 1 Visit OT Treatments $Self Care/Home Management : 8-22 mins  Dessie Coma, M.S. OTR/L  09/12/22, 10:04 AM  ascom [phone]

## 2022-09-12 NOTE — Progress Notes (Signed)
[facility] Cardiology    SUBJECTIVE: Patient resting comfortably in bed denies any significant chest pain.  Patient states improved shortness of breath denies any pain no bleeding denies any palpitations or tachycardia   Vitals:   09/11/22 2048 09/11/22 2321 09/11/22 2326 09/12/22 0908  BP: 133/62 (!) 100/51 (!) 106/55 118/70  Pulse: 88 66 64 83  Resp: '16 16  16  '$ Temp: 99.6 F (37.6 C) 99.7 F (37.6 C)  97.9 F (36.6 C)  TempSrc: Oral     SpO2: 98% 98%  100%  Weight:      Height:         Intake/Output Summary (Last 24 hours) at 09/12/2022 1147 Last data filed at 09/12/2022 0700 Gross per 24 hour  Intake 519.92 ml  Output 700 ml  Net -180.08 ml      PHYSICAL EXAM  General: Well developed, well nourished, in no acute distress HEENT:  Normocephalic and atramatic Neck:  No JVD.  Lungs: Clear bilaterally to auscultation and percussion. Heart: HRRR . Normal S1 and S2 without gallops or murmurs.  Abdomen: Bowel sounds are positive, abdomen soft and non-tender  Msk:  Back normal, normal gait. Normal strength and tone for age. Extremities: No clubbing, cyanosis or edema.   Neuro: Alert and oriented X 3. Psych:  Good affect, responds appropriately   LABS: Basic Metabolic Panel: Recent Labs    09/11/22 0507 09/12/22 0628  NA 140 141  K 3.7 4.2  CL 108 107  CO2 25 24  GLUCOSE 88 97  BUN 43* 44*  CREATININE 2.24* 2.44*  CALCIUM 8.0* 8.0*   Liver Function Tests: No results for input(s): "AST", "ALT", "ALKPHOS", "BILITOT", "PROT", "ALBUMIN" in the last 72 hours. No results for input(s): "LIPASE", "AMYLASE" in the last 72 hours. CBC: Recent Labs    09/11/22 0507 09/12/22 0628  WBC 8.7 9.5  NEUTROABS 6.4 6.9  HGB 10.1* 9.9*  HCT 32.3* 31.7*  MCV 98.2 97.8  PLT 110* 120*   Cardiac Enzymes: No results for input(s): "CKTOTAL", "CKMB", "CKMBINDEX", "TROPONINI" in the last 72 hours. BNP: Invalid input(s): "POCBNP" D-Dimer: No results for input(s): "DDIMER" in the last  72 hours. Hemoglobin A1C: No results for input(s): "HGBA1C" in the last 72 hours. Fasting Lipid Panel: No results for input(s): "CHOL", "HDL", "LDLCALC", "TRIG", "CHOLHDL", "LDLDIRECT" in the last 72 hours. Thyroid Function Tests: No results for input(s): "TSH", "T4TOTAL", "T3FREE", "THYROIDAB" in the last 72 hours.  Invalid input(s): "FREET3" Anemia Panel: No results for input(s): "VITAMINB12", "FOLATE", "FERRITIN", "TIBC", "IRON", "RETICCTPCT" in the last 72 hours.  No results found.   Echo preserved left ventricular function EF around 60%  TELEMETRY: Atrial fibrillation rate of about 80 nonspecific ST-T wave changes:  ASSESSMENT AND PLAN:  Principal Problem:   Acute on chronic diastolic (congestive) heart failure (HCC) Active Problems:   Renal transplant recipient   Dyslipidemia   Chest pain   Elevated d-dimer   Paroxysmal atrial fibrillation ([place])   Type 2 diabetes mellitus with chronic kidney disease, with long-term current use of insulin (HCC)   Hypertensive urgency   Diabetic neuropathy (HCC)   Acute on chronic diastolic heart failure (HCC)    Acute on chronic congestive heart failure with diastolic dysfunction continue current heart failure therapy including Lasix drip 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 continue nephrology input Hyperlipidemia maintain statin therapy for lipid management Hypertensive urgency continue aggressive blood pressure management and control Diabetes x 2 recommend continued current  therapy with insulin NovoLog continue strict diabetes management and control Elevated troponin likely demand ischemia no evidence of ACS continue conservative therapy Atrial fibrillation paroxysmal recurrent now in controlled A-fib continue Eliquis for anticoagulation Cardizem for rate control was on flecainide but now on amiodarone therapy Continue conservative cardiac management at this stage   Yolonda Kida, MD 09/12/2022 11:47 AM

## 2022-09-12 NOTE — Progress Notes (Signed)
[facility]  ROUNDING NOTE   Subjective:   Jean Johnson is a 72 y.o. female with past medical conditions including diabetes, atrial fibrillation on Eliquis, dyslipidemia, hypertension, end-stage renal disease status post renal transplant x 2, diabetic neuropathy, and PUD.  Patient presents to the emergency department with complaints of shortness of breath and chest pain.  Patient has been admitted for Acute on chronic diastolic heart failure (HCC) [I50.33] Acute on chronic diastolic (congestive) heart failure (HCC) [I50.33] Dyspnea, unspecified type [R06.00]  Patient is known to our practice and is followed outpatient by Dr. Lanora Manis.  Patient was last seen in office on 08/23/2022 for routine follow-up.    Patient seen sitting up in bed Caregiver at bedside Denies shortness of breath Lower extremity edema improved.   Objective:  Vital signs in last 24 hours:  Temp:  [97.9 F (36.6 C)-100 F (37.8 C)] 100 F (37.8 C) (03/04 1203) Pulse Rate:  [64-95] 72 (03/04 1203) Resp:  [16-18] 18 (03/04 1203) BP: (100-133)/(51-70) 118/61 (03/04 1203) SpO2:  [98 %-100 %] 98 % (03/04 1203)  Weight change:  Filed Weights   09/09/22 1347 09/10/22 0600 09/11/22 0334  Weight: 50.9 kg 52.7 kg 50.4 kg    Intake/Output: I/O last 3 completed shifts: In: 727.1 [P.O.:617; I.V.:110.1] Out: 700 [Urine:700]   Intake/Output this shift:  Total I/O In: -  Out: 325 [Urine:325]  Physical Exam: General: NAD  Head: Normocephalic, atraumatic. Moist oral mucosal membranes  Eyes: Anicteric  Lungs:  Clear to auscultation, normal effort  Heart: Regular rate and rhythm  Abdomen:  Soft, nontender, nondistended  Extremities: 1+ peripheral edema.  Neurologic: Nonfocal, moving all four extremities  Skin: No lesions  Access: None    Basic Metabolic Panel: Recent Labs  Lab 09/06/22 2042 09/07/22 0316 09/08/22 0541 09/09/22 0556 09/10/22 0533 09/11/22 0507 09/12/22 0628  NA  --     < > 142 141 138 140 141  K  --    < > 3.8 3.7 3.8 3.7 4.2  CL  --    < > 109 109 109 108 107  CO2  --    < > 24 23 21* 25 24  GLUCOSE  --    < > 92 86 151* 88 97  BUN  --    < > 41* 44* 44* 43* 44*  CREATININE  --    < > 1.52* 1.83* 2.05* 2.24* 2.44*  CALCIUM  --    < > 8.2* 7.8* 8.0* 8.0* 8.0*  MG 2.1  --  1.8 1.9  --   --   --   PHOS  --   --  3.7  --   --   --   --    < > = values in this interval not displayed.     Liver Function Tests: Recent Labs  Lab 09/07/22 0316 09/08/22 0541  AST 13* 14*  ALT 8 7  ALKPHOS 60 57  BILITOT 0.8 0.7  PROT 4.3* 4.3*  ALBUMIN 2.1* 2.0*    No results for input(s): "LIPASE", "AMYLASE" in the last 168 hours. No results for input(s): "AMMONIA" in the last 168 hours.  CBC: Recent Labs  Lab 09/07/22 0316 09/08/22 0541 09/10/22 0533 09/11/22 0507 09/12/22 0628  WBC 10.4 11.2* 8.6 8.7 9.5  NEUTROABS  --  9.6* 6.5 6.4 6.9  HGB 11.2* 11.1* 10.1* 10.1* 9.9*  HCT 35.6* 35.4* 33.0* 32.3* 31.7*  MCV 97.8 97.5 98.5 98.2 97.8  PLT 121* 105*  101* 110* 120*     Cardiac Enzymes: No results for input(s): "CKTOTAL", "CKMB", "CKMBINDEX", "TROPONINI" in the last 168 hours.  BNP: Invalid input(s): "POCBNP"  CBG: Recent Labs  Lab 09/11/22 0831 09/11/22 1204 09/11/22 1617 09/11/22 2043 09/12/22 1401  GLUCAP 78 117* 103* 111* 140*     Microbiology: Results for orders placed or performed during the hospital encounter of 08/29/22  Surgical pcr screen     Status: None   Collection Time: 08/29/22 11:08 AM   Specimen: Nasal Mucosa; Nasal Swab  Result Value Ref Range Status   MRSA, PCR NEGATIVE NEGATIVE Final   Staphylococcus aureus NEGATIVE NEGATIVE Final    Comment: (NOTE) The Xpert SA Assay (FDA approved for NASAL specimens in patients 43 years of age and older), is one component of a comprehensive surveillance program. It is not intended to diagnose infection nor to guide or monitor treatment. Performed at [facility],  [address] [place], [place] [address]     Coagulation Studies: No results for input(s): "LABPROT", "INR" in the last 72 hours.  Urinalysis: No results for input(s): "COLORURINE", "LABSPEC", "PHURINE", "GLUCOSEU", "HGBUR", "BILIRUBINUR", "KETONESUR", "PROTEINUR", "UROBILINOGEN", "NITRITE", "LEUKOCYTESUR" in the last 72 hours.  Invalid input(s): "APPERANCEUR"     Imaging: No results found.   Medications:      amiodarone  400 mg Oral BID   apixaban  2.5 mg Oral BID   vitamin C  500 mg Oral BID   aspirin EC  81 mg Oral Daily   atorvastatin  10 mg Oral Daily   Chlorhexidine Gluconate Cloth  6 each Topical Q0600   diltiazem  60 mg Oral Q12H   ezetimibe  10 mg Oral Daily   feeding supplement  237 mL Oral TID BM   furosemide  80 mg Oral Daily   insulin aspart  0-5 Units Subcutaneous QHS   insulin aspart  0-9 Units Subcutaneous TID WC   multivitamin with minerals  1 tablet Oral Daily   mycophenolate  500 mg Oral BID   mouth rinse  15 mL Mouth Rinse 4 times per day   potassium chloride SA  20 mEq Oral Daily   pregabalin  50 mg Oral BID   tacrolimus  3 mg Oral BID   zinc sulfate  220 mg Oral Daily   acetaminophen **OR** acetaminophen, iohexol, labetalol, magnesium hydroxide, morphine injection, nitroGLYCERIN, ondansetron **OR** ondansetron (ZOFRAN) IV, mouth rinse, traZODone  Assessment/ Plan:  [name] is a 72 y.o.  female  with past medical conditions including diabetes, atrial fibrillation on Eliquis, dyslipidemia, hypertension, end-stage renal disease status post renal transplant x 2, diabetic neuropathy, and PUD.  Patient presents to the emergency department with complaints of shortness of breath and chest pain.  Patient has been admitted for Acute on chronic diastolic heart failure (HCC) [I50.33] Acute on chronic diastolic (congestive) heart failure (HCC) [I50.33] Dyspnea, unspecified type [R06.00]   Acute Kidney Injury on chronic kidney disease  stage IIIAt with baseline creatinine 1.27 and GFR of 45 on 08/18/22.  Acute kidney injury secondary to cardiorenal syndrome Chronic kidney disease is secondary to diabetes, hypertension and CHF. No exposure to IV contrast. Furosemide drip started in ED.   Creatinine continues to rise slowly. Inaccurate urine output recorded. Will transition to oral diuretics.   Lab Results  Component Value Date   CREATININE 2.44 (H) 09/12/2022   CREATININE 2.24 (H) 09/11/2022   CREATININE 2.05 (H) 09/10/2022    Intake/Output Summary (Last 24 hours) at  09/12/2022 1458 Last data filed at 09/12/2022 1200 Gross per 24 hour  Intake 282.92 ml  Output 1025 ml  Net -742.08 ml    2. Anemia of chronic kidney disease  Lab Results  Component Value Date   HGB 9.9 (L) 09/12/2022    Hemoglobin within optimal range.   3. Secondary Hyperparathyroidism: with outpatient labs: PTH 141, phosphorus 3.2, calcium 9.7 on 08/18/22.   Lab Results  Component Value Date   CALCIUM 8.0 (L) 09/12/2022   CAION 1.25 08/30/2022   PHOS 3.7 09/08/2022    Will continue to monitor bone minerals during this admission. 4. Diabetes mellitus type II with chronic kidney disease/renal manifestations: insulin/noninsulin dependent. Home regimen includes Humalog and levemir. Most recent hemoglobin A1c is 5.7 on 04/14/22.   Primary team to manage sliding scale insulin.  5. Acute on Chronic diastolic heart failure. ECHO on 09/08/22 shows EF 60-65% with a grade 1 diastolic dysfunction, mild AVR with stenosis.   Will transition to oral diuresis today. Furosemide '80mg'$  daily.     LOS: 6   3/4/20242:58 PM

## 2022-09-12 NOTE — Care Management Important Message (Signed)
Important Message  Patient Details  Name: Jean Johnson MRN: [id number] Date of Birth: [birth year]   Medicare Important Message Given:  Yes     Loann Quill 09/12/2022, 4:13 PM

## 2022-09-12 NOTE — Progress Notes (Signed)
Nutrition Follow-up  DOCUMENTATION CODES:   Severe malnutrition in context of chronic illness  INTERVENTION:   -Continue Ensure Enlive po TID, each supplement provides 350 kcal and 20 grams of protein -D/c B-complex with C -500 mg vitamin C BID -220 mg zinc sulfate daily -Magic cup TID with meals, each supplement provides 290 kcal and 9 grams of protein   NUTRITION DIAGNOSIS:   Severe Malnutrition related to chronic illness (ESRD s/p renal transplant x 2) as evidenced by moderate fat depletion, severe fat depletion, moderate muscle depletion, severe muscle depletion, edema.  Ongoing  GOAL:   Patient will meet greater than or equal to 90% of their needs  Progressing   MONITOR:   PO intake, Supplement acceptance  REASON FOR ASSESSMENT:   Consult Wound healing  ASSESSMENT:   72 y.o. female with past medical conditions including diabetes, atrial fibrillation on Eliquis, dyslipidemia, hypertension, end-stage renal disease status post renal transplant x 2, diabetic neuropathy, and PUD.  Patient presents to the emergency department with complaints of shortness of breath and chest pain.  Patient has been admitted for Acute on chronic diastolic heart failure.  3/2- s/p BSE- dysphagia 3 diet with thin liquids  Reviewed I/O's: -120 ml x 24 hours and -1.5 L since admission  UOP: 700 ml x 24 hours  Per CWOCN notes, pt with stage 3 pressure injury to sacrum.   Case discussed with SLP, who reports concern with pt's poor oral intake. Pt requires feeding assistance at meals.   Spoke with pt and family at bedside. Family described pt as a very picky eater and prefers sweets. PTA, pt eats 1-2 times per day and her husband provides her with whatever foods she requests. Pt has Boost supplements at home, but often does not drink them. Per sister-in-law, they recently purchased DuoCal and Benecalorie, which they often mix into pt's food. Pt family has been helping pt select meals while  here.    Reviewed wt hx; pt has experienced a 3.4% wt loss over the past year, which is not significant for time frame.   Discussed importance of good meal and supplement intake to promote healing. Pt amenable to continue Ensure and would like to try Magic Cups.   Palliative care has been consulted for goals of care discussions.   Medications reviewed and include cardizem and lasix.   Labs reviewed: CBGS: 103-140 (inpatient orders for glycemic control are 0-5 units insulin aspart daily at bedtime and 0-9 units insulin aspart TID with meals).    NUTRITION - FOCUSED PHYSICAL EXAM:  Flowsheet Row Most Recent Value  Orbital Region Moderate depletion  Upper Arm Region Severe depletion  Thoracic and Lumbar Region Severe depletion  Buccal Region Mild depletion  Temple Region Mild depletion  Clavicle Bone Region Severe depletion  Clavicle and Acromion Bone Region Severe depletion  Scapular Bone Region Severe depletion  Dorsal Hand Severe depletion  Patellar Region Severe depletion  Anterior Thigh Region Severe depletion  Posterior Calf Region Severe depletion  Edema (RD Assessment) Moderate  Hair Reviewed  Eyes Reviewed  Mouth Reviewed  Skin Reviewed  Nails Reviewed       Diet Order:   Diet Order             DIET DYS 3 Room service appropriate? Yes with Assist; Fluid consistency: Thin  Diet effective now                   EDUCATION NEEDS:   Education needs have been addressed  Skin:  Skin Assessment: Skin Integrity Issues: Skin Integrity Issues:: Stage III Stage III: sacrum  Last BM:  09/05/22  Height:   Ht Readings from Last 1 Encounters:  09/06/22 '5\' 2"'$  (1.575 m)    Weight:   Wt Readings from Last 1 Encounters:  09/11/22 50.4 kg    Ideal Body Weight:  50 kg  BMI:  Body mass index is 20.32 kg/m.  Estimated Nutritional Needs:   Kcal:  1550-1750  Protein:  85-100 grams  Fluid:  > 1.5 L    Loistine Chance, RD, LDN, [place] Registered Dietitian  II Certified Diabetes Care and Education Specialist Please refer to [facility] for RD and/or RD on-call/weekend/after hours pager

## 2022-09-12 NOTE — Progress Notes (Signed)
Physical Therapy Treatment Patient Details Name: Jean Johnson MRN: [id number] DOB: 10/18/1950 Today's Date: 09/12/2022   History of Present Illness Jean Johnson is a 72 y.o. African-American female with medical history significant for end-stage renal disease s/p renal transplant x2, hypertension, dyslipidemia, hypothyroidism, paroxysmal atrial fibrillation on Eliquis, PUD, type 2 diabetes mellitus with diabetic neuropathy, who presented to the emergency room with acute onset of worsening dyspnea with associated chest pain which has been progressively worse over the last week.    PT Comments    Pt resting in bed upon PT arrival; pt agreeable to sitting on edge of bed but declined to try standing d/t fatigue.  During session pt max assist with bed mobility and mod assist for sitting balance d/t posterior L lean.  Pt fatigued quickly in sitting limiting sitting activities (pt requesting to rest after laying down).  Will continue to focus on strengthening and mobility per pt tolerance.    Recommendations for follow up therapy are one component of a multi-disciplinary discharge planning process, led by the attending physician.  Recommendations may be updated based on patient status, additional functional criteria and insurance authorization.  Follow Up Recommendations  Home health PT (24/7 assist)     Assistance Recommended at Discharge Frequent or constant Supervision/Assistance  Patient can return home with the following A lot of help with walking and/or transfers;A lot of help with bathing/dressing/bathroom;Assistance with cooking/housework;Assistance with feeding;Direct supervision/assist for medications management;Direct supervision/assist for financial management;Assist for transportation;Help with stairs or ramp for entrance   Equipment Recommendations  Hospital bed;Other (comment) (hoyer lift)    Recommendations for Other Services       Precautions / Restrictions  Precautions Precautions: Fall Precaution Comments: Sacral wound; aspiration; L UE AV fistula Restrictions Weight Bearing Restrictions: No     Mobility  Bed Mobility Overal bed mobility: Needs Assistance Bed Mobility: Supine to Sit, Sit to Supine     Supine to sit: Max assist, HOB elevated Sit to supine: Max assist, HOB elevated   General bed mobility comments: assist for trunk and B LE's; 2 assist to boost pt up in bed and reposition end of session    Transfers                   General transfer comment: pt declined d/t fatigue    Ambulation/Gait                   Stairs             Wheelchair Mobility    Modified Rankin (Stroke Patients Only)       Balance Overall balance assessment: Needs assistance Sitting-balance support: Feet unsupported, Bilateral upper extremity supported Sitting balance-Leahy Scale: Poor Sitting balance - Comments: mod assist d/t L lateral posterior lean Postural control: Left lateral lean, Posterior lean                                  Cognition Arousal/Alertness: Awake/alert Behavior During Therapy: WFL for tasks assessed/performed Overall Cognitive Status: Impaired/Different from baseline Area of Impairment: Problem solving                             Problem Solving: Slow processing          Exercises Total Joint Exercises Long Arc Quad: AAROM, Strengthening, Both, 5 reps, Seated    General Comments  Nursing cleared pt for participation in physical therapy.  Pt agreeable to PT session.      Pertinent Vitals/Pain Pain Assessment Pain Assessment: Faces Faces Pain Scale:  (6/10 with activity; 2/10 at rest) Pain Location: sacral pain/wound pain Pain Descriptors / Indicators: Aching, Discomfort, Constant Pain Intervention(s): Limited activity within patient's tolerance, Monitored during session, Repositioned    Home Living                          Prior  Function            PT Goals (current goals can now be found in the care plan section) Acute Rehab PT Goals Patient Stated Goal: to return home PT Goal Formulation: With patient Time For Goal Achievement: 09/22/22 Potential to Achieve Goals: Fair Progress towards PT goals: Progressing toward goals    Frequency    Min 2X/week      PT Plan Current plan remains appropriate    Co-evaluation              AM-PAC PT "6 Clicks" Mobility   Outcome Measure  Help needed turning from your back to your side while in a flat bed without using bedrails?: A Lot Help needed moving from lying on your back to sitting on the side of a flat bed without using bedrails?: A Lot Help needed moving to and from a bed to a chair (including a wheelchair)?: Total Help needed standing up from a chair using your arms (e.g., wheelchair or bedside chair)?: Total Help needed to walk in hospital room?: Total Help needed climbing 3-5 steps with a railing? : Total 6 Click Score: 8    End of Session   Activity Tolerance: Patient limited by fatigue Patient left: in bed;with call bell/phone within reach;with bed alarm set;Other (comment) (pt turned towards R side with pillows placed for pressure relief) Nurse Communication: Mobility status;Precautions PT Visit Diagnosis: Other abnormalities of gait and mobility (R26.89);Muscle weakness (generalized) (M62.81)     Time: [id number] PT Time Calculation (min) (ACUTE ONLY): 20 min  Charges:  $Therapeutic Activity: 8-22 mins                     Leitha Bleak, PT 09/12/22, 4:24 PM

## 2022-09-12 NOTE — Progress Notes (Signed)
Progress Note   Patient: Jean Johnson [id number] DOB: Aug 31, 1950 DOA: 09/06/2022     6 DOS: the patient was seen and examined on 09/12/2022   Subjective:  Seen and examined at bedside this morning Continues to be lethargic however appeared more awake today than she has been over the weekend Denies chest pain, nausea vomiting or abdominal pain Palliative team have been consulted I discussed goals of care with patient's family including her daughter-in-law as well as husband who are present at bedside Patient elected to be full code at this time     Brief Narrative:  72 year old African-American female with past medical history significant for ESRD s/p renal transplant x 2 now having CKD stage IIIb, hypertension, dyslipidemia, hypothyroidism, PAF on anticoagulation with Eliquis, PUD, type 2 diabetes mellitus with diabetic neuropathy as well as other comorbidities who presented to the ED with acute worsening of her dyspnea associate with chest discomfort that progressively worsened over the last few weeks but significantly worsened. She has had associated worsening lower extremity swelling and edema without pain as well as orthopnea.  She has had diminished urine output without dysuria urinary frequency or flank pain.  Denies any cough or hemoptysis or wheezing.  Has been having occasional palpitations and she feels that it is related to her proximal. Her D-dimer slightly elevated and a CTA of the chest was done to be ordered to evaluate for PE but given her renal function VQ scan was pursued instead and showed low probability for PE.      Assessment & Plan:   Principal Problem:   Acute on chronic diastolic (congestive) heart failure (HCC) Active Problems:   Hypertensive urgency   Chest pain   Elevated d-dimer   Renal transplant recipient   Dyslipidemia   Paroxysmal atrial fibrillation (HCC)   Type 2 diabetes mellitus with chronic kidney disease, with long-term current use of  insulin (HCC)   Diabetic neuropathy (HCC)   Acute on chronic diastolic heart failure (HCC)   Acute on chronic diastolic congestive heart failure Presents with 2 weeks of chest tightness with only relieved by lying on her left side.   BNP elevated despite adherence to home Lasix 20 mg daily Remains clinically volume overloaded Plan: Nephrology team and transitioning Lasix drip to oral Lasix today ACE inhibitor on hold at this time in the setting of increasing creatinine Strict I's and O's, low-salt diet, daily weights Echo October 2023 showed ejection fraction 60 to 65% with moderate MR   Hypertensive urgency Blood pressure improved control.   Patient still on Lasix drip cardiology nephrologist recommendation   Paroxysmal atrial fibrillation with rapid ventricular response Ventricular rate have improved.  Currently 60s to 90s on telemetry. Plan: Continue Eliquis 5 mg twice daily Cardizem 120 mg twice daily Given worsening renal function flecainide have been switched to amiodarone     ESRD s/p renal transplant x 2 Chronic immunosuppression Acute on chronic renal failure likely multifactorial in the setting of diuresis as well as hepatorenal syndrome Still makes urine.  Followed by nephrology outpatient.  Baseline creatinine 1.29-1.33 Plan: Continue immunosuppressive regimen of CellCept and tacrolimus Nephrology on board we appreciate recommendations   Elevated troponin likely demand ischemia Dyslipidemia Borderline elevation.  Flat trend Not indicative of ACS Continue continue aspirin and statin therapy   Diabetic neuropathy (HCC) Continue with Pregabalin 50 mg p.o. twice daily   Type 2 diabetes mellitus with chronic kidney disease, with long-term current use of insulin (HCC) -C/w supplemental coverage with sensitive  NovoLog sliding scale insulin AC and at bedtime and continue basal coverage with insulin detemir 8 units subcu nightly. -Continue to Monitor CBG's per  Protocol; CBGs ranging from 73-127    DVT prophylaxis: Eliquis   Code Status: Full code   Family Communication: Plan of care discussed with patient's husband at bedside   Disposition Plan: Status is: Inpatient      Level of care: Progressive   Consultants:  Cardiology- [facility]- Dr. Juleen China   Procedures:  None       Physical Exam: General: NAD, frail  Head: Normocephalic, atraumatic. Moist oral mucosal membranes  Eyes: Anicteric  Lungs:  Clear to auscultation, normal effort  Heart: Regular rate and rhythm  Abdomen:  Soft, nontender, nondistended  Extremities: 1+ peripheral edema.  Neurologic: Nonfocal, moving all four extremities  Skin: No lesions         Data Reviewed:   I have reviewed the patient's CBC and bMP   Time spent: 35 minutes      Vitals:   09/11/22 2326 09/12/22 0908 09/12/22 1203 09/12/22 1615  BP: (!) 106/55 118/70 118/61 110/64  Pulse: 64 83 72 75  Resp:  '16 18 20  '$ Temp:  97.9 F (36.6 C) 100 F (37.8 C) 99.2 F (37.3 C)  TempSrc:   Oral Oral  SpO2:  100% 98% (!) 77%  Weight:      Height:         Author: Verline Lema, MD 09/12/2022 6:55 PM  For on call review [email or web address].

## 2022-09-13 DIAGNOSIS — I5033 Acute on chronic diastolic (congestive) heart failure: Secondary | ICD-10-CM | POA: Diagnosis not present

## 2022-09-13 DIAGNOSIS — Z7189 Other specified counseling: Secondary | ICD-10-CM

## 2022-09-13 DIAGNOSIS — E43 Unspecified severe protein-calorie malnutrition: Secondary | ICD-10-CM | POA: Insufficient documentation

## 2022-09-13 LAB — GLUCOSE, CAPILLARY
Glucose-Capillary: 121 mg/dL — ABNORMAL HIGH (ref 70–99)
Glucose-Capillary: 157 mg/dL — ABNORMAL HIGH (ref 70–99)
Glucose-Capillary: 45 mg/dL — ABNORMAL LOW (ref 70–99)
Glucose-Capillary: 82 mg/dL (ref 70–99)
Glucose-Capillary: 88 mg/dL (ref 70–99)
Glucose-Capillary: 90 mg/dL (ref 70–99)

## 2022-09-13 LAB — CBC WITH DIFFERENTIAL/PLATELET
Abs Immature Granulocytes: 0.04 10*3/uL (ref 0.00–0.07)
Basophils Absolute: 0 10*3/uL (ref 0.0–0.1)
Basophils Relative: 0 %
Eosinophils Absolute: 0.1 10*3/uL (ref 0.0–0.5)
Eosinophils Relative: 1 %
HCT: 32.4 % — ABNORMAL LOW (ref 36.0–46.0)
Hemoglobin: 10 g/dL — ABNORMAL LOW (ref 12.0–15.0)
Immature Granulocytes: 1 %
Lymphocytes Relative: 15 %
Lymphs Abs: 1.2 10*3/uL (ref 0.7–4.0)
MCH: 30.3 pg (ref 26.0–34.0)
MCHC: 30.9 g/dL (ref 30.0–36.0)
MCV: 98.2 fL (ref 80.0–100.0)
Monocytes Absolute: 0.8 10*3/uL (ref 0.1–1.0)
Monocytes Relative: 10 %
Neutro Abs: 6.1 10*3/uL (ref 1.7–7.7)
Neutrophils Relative %: 73 %
Platelets: 125 10*3/uL — ABNORMAL LOW (ref 150–400)
RBC: 3.3 MIL/uL — ABNORMAL LOW (ref 3.87–5.11)
RDW: 17.5 % — ABNORMAL HIGH (ref 11.5–15.5)
WBC: 8.3 10*3/uL (ref 4.0–10.5)
nRBC: 0 % (ref 0.0–0.2)

## 2022-09-13 LAB — BASIC METABOLIC PANEL
Anion gap: 6 (ref 5–15)
BUN: 50 mg/dL — ABNORMAL HIGH (ref 8–23)
CO2: 25 mmol/L (ref 22–32)
Calcium: 8.2 mg/dL — ABNORMAL LOW (ref 8.9–10.3)
Chloride: 108 mmol/L (ref 98–111)
Creatinine, Ser: 2.75 mg/dL — ABNORMAL HIGH (ref 0.44–1.00)
GFR, Estimated: 18 mL/min — ABNORMAL LOW (ref >60)
Glucose, Bld: 88 mg/dL (ref 70–99)
Potassium: 4 mmol/L (ref 3.5–5.1)
Sodium: 139 mmol/L (ref 135–145)

## 2022-09-13 LAB — TACROLIMUS LEVEL: Tacrolimus (FK506) - LabCorp: 13.9 ng/mL (ref 2.0–20.0)

## 2022-09-13 MED ORDER — AMIODARONE HCL 200 MG PO TABS: 200.0000 mg | ORAL_TABLET | Freq: Every day | ORAL | Status: DC

## 2022-09-13 MED ORDER — DEXTROSE 50 % IV SOLN
INTRAVENOUS | Status: AC
  Administered 2022-09-13: 50 mL
  Filled 2022-09-13: qty 50

## 2022-09-13 MED ORDER — DOCUSATE SODIUM 100 MG PO CAPS
100.0000 mg | ORAL_CAPSULE | Freq: Two times a day (BID) | ORAL | Status: DC
  Administered 2022-09-13 – 2022-09-24 (×21): 100 mg via ORAL
  Filled 2022-09-13 (×22): qty 1

## 2022-09-13 MED ORDER — INSULIN ASPART 100 UNIT/ML IJ SOLN
0.0000 [IU] | INTRAMUSCULAR | Status: DC
  Administered 2022-09-15: 1 [IU] via SUBCUTANEOUS
  Administered 2022-09-16 (×2): 2 [IU] via SUBCUTANEOUS
  Administered 2022-09-17: 1 [IU] via SUBCUTANEOUS
  Administered 2022-09-18: 2 [IU] via SUBCUTANEOUS
  Filled 2022-09-13 (×6): qty 1

## 2022-09-13 MED ORDER — FUROSEMIDE 40 MG PO TABS
40.0000 mg | ORAL_TABLET | Freq: Every day | ORAL | Status: DC
  Administered 2022-09-13 – 2022-09-15 (×3): 40 mg via ORAL
  Filled 2022-09-13 (×2): qty 1

## 2022-09-13 MED ORDER — DEXTROSE 50 % IV SOLN
INTRAVENOUS | Status: AC
  Filled 2022-09-13: qty 50

## 2022-09-13 MED ORDER — AMIODARONE HCL 200 MG PO TABS
200.0000 mg | ORAL_TABLET | Freq: Two times a day (BID) | ORAL | Status: DC
  Administered 2022-09-13 – 2022-09-24 (×23): 200 mg via ORAL
  Filled 2022-09-13 (×23): qty 1

## 2022-09-13 NOTE — Progress Notes (Signed)
[place] NOTE       Patient ID: Jean Johnson MRN: [id number] DOB/AGE: Apr 08, 1951 72 y.o.  Admit date: 09/06/2022 Referring Physician Dr, Eugenie Norrie Primary Physician Dr. Tressia Miners Primary Cardiologist Dr. Clayborn Bigness Reason for Consultation AoCHF  HPI: Jean Johnson. Jean Johnson is a 60yoF with a PMH of paroxysmal AF (flecainide, Eliquis), HFpEF (60-65%, moderate LVH, G1 DD, mild AS, mild-moderate TR 08/2022)  ESRD s/p renal transplants x2  (initially in 2000 but failed, second in 2008), DM2 who presented to [facility] ED 09/06/2022 with chest tightness, peripheral edema, and orthopnea.  Cardiology is consulted for assistance with her heart failure.  Interval History:  - no acute events - sister in law at bedside. PO intake remains poor, difficulty swallowing solid food - feels overall weak. Main complaint is pain from an ulcer on her bottom. Peripheral edema overall improved - in AF on tele, rate controlled in the 60s-90s  Review of systems complete and found to be negative unless listed above     Past Medical History:  Diagnosis Date   Anemia of chronic renal disease    Aortic stenosis, mild    a.) TTE on 11/23/2020 --> mean gradient 9.7 mmHg   Atrial fibrillation ([place])    a.) CHA2DS2VASc = 4 (age, sex, HTN, T2DM);  b.) rate/rhythm maintained on oral diltiazem + flecainide; chronically anticoagulated with apixaban   B12 deficiency    Cervical spinal stenosis    Diabetic neuropathy (HCC)    Diverticulosis    Dyspnea    ESRD (end stage renal disease) (HCC)    First degree AV block    Gastrointestinal ulcer    HLD (hyperlipidemia)    Hypertension    Hypothyroidism    Incomplete right bundle branch block (RBBB)    LAE (left atrial enlargement)    a.) TTE on 11/23/2020 --> moderate   Left thyroid nodule    a.) cervical MRI on 12/29/2020 --> measured "at least" 3.5 cm; incompletely imaged.   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   Valvular regurgitation    a.) TTE 11/23/2020 --> mild panvalvular regurgitation; b.) TTE 04/29/2022: mild MR    Past Surgical History:  Procedure Laterality Date   A/V FISTULAGRAM Left    ANTERIOR CERVICAL DECOMP/DISCECTOMY FUSION N/A 03/24/2021   Procedure: C3-6 ANTERIOR CERVICAL DECOMPRESSION/DISCECTOMY FUSION 3 LEVELS;  Surgeon: Meade Maw, MD;  Location: [facility] ORS;  Service: Neurosurgery;  Laterality: N/A;   ARTERY BIOPSY Right 08/30/2022   Procedure: BIOPSY TEMPORAL ARTERY;  Surgeon: Katha Cabal, MD;  Location: [facility] ORS;  Service: Vascular;  Laterality: Right;   CARPAL TUNNEL RELEASE Right 2016   CATARACT EXTRACTION W/ INTRAOCULAR LENS IMPLANT Right 03/2020   CATARACT EXTRACTION W/PHACO Left 10/21/2020   Procedure: CATARACT EXTRACTION PHACO AND INTRAOCULAR LENS PLACEMENT ([place]) DIABETES LEFT;  Surgeon: Leandrew Koyanagi, MD;  Location: [place];  Service: Ophthalmology;  Laterality: Left;  2.84 0:48.8 5.8%   COLONOSCOPY  WITH PROPOFOL N/A 09/03/2021   Procedure: COLONOSCOPY WITH PROPOFOL;  Surgeon: Lesly Rubenstein, MD;  Location: [facility] ENDOSCOPY;  Service: Endoscopy;  Laterality: N/A;  DM, ELIQUIS, WHEELCHAIR   EYE SURGERY Right 2020   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   Living donor organ recipient (sister)   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   Cadaveric organ recipient    Medications Prior to Admission  Medication Sig Dispense Refill Last Dose    alendronate (FOSAMAX) 70 MG tablet Take 70 mg by mouth once a week. monday   09/05/2022   apixaban (ELIQUIS) 5 MG TABS tablet Take 5 mg by mouth 2 (two) times daily.   09/06/2022 at 0830   atorvastatin (LIPITOR) 10 MG tablet Take 10 mg by mouth daily.   09/05/2022   diltiazem (CARDIZEM CD) 240 MG 24 hr capsule Take 240 mg by mouth at bedtime.   09/05/2022 at 2200   ezetimibe (ZETIA) 10 MG tablet Take 10 mg by mouth daily.   09/05/2022   flecainide (TAMBOCOR) 100 MG tablet Take 100 mg by mouth every 12 (twelve) hours.   09/05/2022   furosemide (LASIX) 20 MG tablet Take 20 mg by mouth 2 (two) times daily as needed.   09/05/2022   insulin detemir (LEVEMIR) 100 UNIT/ML injection Inject 8 Units into the skin at bedtime.   09/05/2022   insulin lispro (HUMALOG) 100 UNIT/ML injection Inject 5 Units into the skin 2 (two) times daily.   09/05/2022   lisinopril (ZESTRIL) 30 MG tablet Take 30 mg by mouth daily.   09/05/2022   mycophenolate (CELLCEPT) 500 MG tablet Take 500 mg by mouth 2 (two) times daily.   09/05/2022   Potassium Chloride ER 20 MEQ TBCR Take 1 tablet by mouth daily as needed.   Past Month   predniSONE (DELTASONE) 20 MG tablet Take 20 mg by mouth 3 (three) times daily.   09/05/2022   pregabalin (LYRICA) 50 MG capsule Take 1 capsule (50 mg total) by mouth 2 (two) times daily. 60 capsule 0 09/05/2022   tacrolimus (PROGRAF) 1 MG capsule Take 3 mg by mouth 2 (two) times daily.   09/05/2022   traZODone (DESYREL) 50 MG tablet Take 0.5 tablets (25 mg total) by mouth at bedtime as needed for sleep. 15 tablet 0 unknown    Social History   Socioeconomic History   Marital status: Married    Spouse name: Francee Piccolo   Number of children: Not on file   Years of education: Not on file   Highest education level: Not on file  Occupational History   Not on file  Tobacco Use   Smoking status: Never   Smokeless tobacco: Never  Vaping Use   Vaping Use: Never used  Substance and Sexual Activity   Alcohol use: Not  Currently   Drug use: Never   Sexual activity: Not Currently  Other Topics Concern   Not on file  Social History Narrative   Not on file   Social Determinants of Health   Financial Resource Strain: Not on file  Food Insecurity: No Food Insecurity (09/07/2022)   Hunger Vital Sign    Worried About Running Out of Food in the Last Year: Never true    Ran Out of Food in the Last Year: Never true  Transportation Needs: No Transportation Needs (09/07/2022)   PRAPARE - Hydrologist (Medical): No    Lack of Transportation (Non-Medical): No  Physical Activity: Not on file  Stress: Not on file  Social Connections: Not on file  Intimate Partner Violence: Not At Risk (09/07/2022)   Humiliation, Afraid, Rape, and Kick questionnaire    Fear of Current or Ex-Partner: No    Emotionally Abused: No    Physically Abused: No    Sexually Abused: No    Family History  Problem Relation Age of Onset   Diabetes Father    Heart disease Father       Intake/Output Summary (Last 24 hours) at 09/13/2022 1250 Last data filed at 09/13/2022 0900 Gross per 24 hour  Intake 253.85 ml  Output 500 ml  Net -246.15 ml     Vitals:   09/13/22 0434 09/13/22 0437 09/13/22 0753 09/13/22 1223  BP:  (!) 97/59 128/64 (!) 105/55  Pulse:  80 83 91  Resp: '19 18 18 18  '$ Temp:  98.8 F (37.1 C) 99.1 F (37.3 C) 99.2 F (37.3 C)  TempSrc:      SpO2:  100% 100% 94%  Weight:      Height:        PHYSICAL EXAM General: elderly thin black female, in no acute distress. Laying on her left side in hospital bed, sister-in-law at bedside. HEENT:  Normocephalic and atraumatic. Very soft voice Neck:  No JVD.  Lungs: Normal respiratory effort on room air. Decreased breath sounds with basilar crackles.  Heart: irregularly irregular with controlled rate . Normal S1 and S2, 3/6 systolic murmur heard best at the apex  Abdomen: Non-distended appearing.  Msk: generalized weakness Extremities: cool to  touch, No clubbing, cyanosis. 1+ bilateral LE edema.  Neuro: Alert and oriented X 3. Psych:  Answers questions appropriately. Limited historian.   Labs: Basic Metabolic Panel: Recent Labs    09/12/22 0628 09/13/22 0653  NA 141 139  K 4.2 4.0  CL 107 108  CO2 24 25  GLUCOSE 97 88  BUN 44* 50*  CREATININE 2.44* 2.75*  CALCIUM 8.0* 8.2*    Liver Function Tests: No results for input(s): "AST", "ALT", "ALKPHOS", "BILITOT", "PROT", "ALBUMIN" in the last 72 hours.  No results for input(s): "LIPASE", "AMYLASE" in the last 72 hours. CBC: Recent Labs    09/12/22 0628 09/13/22 0653  WBC 9.5 8.3  NEUTROABS 6.9 6.1  HGB 9.9* 10.0*  HCT 31.7* 32.4*  MCV 97.8 98.2  PLT 120* 125*    Cardiac Enzymes: No results for input(s): "CKTOTAL", "CKMB", "CKMBINDEX", "TROPONINIHS" in the last 72 hours.  BNP: No results for input(s): "BNP" in the last 72 hours.  D-Dimer: No results for input(s): "DDIMER" in the last 72 hours.  Hemoglobin A1C: No results for input(s): "HGBA1C" in the last 72 hours. Fasting Lipid Panel: No results for input(s): "CHOL", "HDL", "LDLCALC", "TRIG", "CHOLHDL", "LDLDIRECT" in the last 72 hours. Thyroid Function Tests: No results for input(s): "TSH", "T4TOTAL", "T3FREE", "THYROIDAB" in the last 72 hours.  Invalid input(s): "FREET3"  Anemia Panel: No results for input(s): "VITAMINB12", "FOLATE", "FERRITIN", "TIBC", "IRON", "RETICCTPCT" in the last 72 hours.    Radiology: ECHOCARDIOGRAM COMPLETE  Result Date: 09/09/2022    ECHOCARDIOGRAM REPORT   Patient Name:   Jean Johnson Date of Exam: 09/08/2022 Medical Rec #:  [id number]           Height:       62.0 in Accession #:    [id number]          Weight:       112.0 lb Date of Birth:  1951/01/19  BSA:          1.494 m Patient Age:    29 years            BP:           139/67 mmHg Patient Gender: F                   HR:           68 bpm. Exam Location:  [facility] Procedure: 2D Echo, Cardiac Doppler and Color  Doppler Indications:     CHF-acute diastolic [id number]  History:         Patient has prior history of Echocardiogram examinations, most                  recent 04/29/2022. Risk Factors:Hypertension and Dyslipidemia.  Sonographer:     Sherrie Sport Referring Phys:  [id number] [place] Jalynne Persico Diagnosing Phys: Isaias Cowman MD IMPRESSIONS  1. Left ventricular ejection fraction, by estimation, is 60 to 65%. The left ventricle has normal function. The left ventricle has no regional wall motion abnormalities. There is moderate concentric left ventricular hypertrophy. Left ventricular diastolic parameters are consistent with Grade I diastolic dysfunction (impaired relaxation).  2. Right ventricular systolic function is normal. The right ventricular size is normal.  3. A small pericardial effusion is present. Moderate pleural effusion.  4. The mitral valve is normal in structure. No evidence of mitral valve regurgitation. No evidence of mitral stenosis.  5. Tricuspid valve regurgitation is mild to moderate.  6. The aortic valve is normal in structure. Aortic valve regurgitation is mild. Mild aortic valve stenosis.  7. The inferior vena cava is normal in size with greater than 50% respiratory variability, suggesting right atrial pressure of 3 mmHg. FINDINGS  Left Ventricle: Left ventricular ejection fraction, by estimation, is 60 to 65%. The left ventricle has normal function. The left ventricle has no regional wall motion abnormalities. The left ventricular internal cavity size was normal in size. There is  moderate concentric left ventricular hypertrophy. Left ventricular diastolic parameters are consistent with Grade I diastolic dysfunction (impaired relaxation). Right Ventricle: The right ventricular size is normal. No increase in right ventricular wall thickness. Right ventricular systolic function is normal. Left Atrium: Left atrial size was normal in size. Right Atrium: Right atrial size was normal in size.  Pericardium: A small pericardial effusion is present. Mitral Valve: The mitral valve is normal in structure. No evidence of mitral valve regurgitation. No evidence of mitral valve stenosis. Tricuspid Valve: The tricuspid valve is normal in structure. Tricuspid valve regurgitation is mild to moderate. No evidence of tricuspid stenosis. Aortic Valve: The aortic valve is normal in structure. Aortic valve regurgitation is mild. Aortic regurgitation PHT measures 508 msec. Mild aortic stenosis is present. Aortic valve mean gradient measures 9.8 mmHg. Aortic valve peak gradient measures 18.0  mmHg. Aortic valve area, by VTI measures 2.00 cm. Pulmonic Valve: The pulmonic valve was normal in structure. Pulmonic valve regurgitation is not visualized. No evidence of pulmonic stenosis. Aorta: The aortic root is normal in size and structure. Venous: The inferior vena cava is normal in size with greater than 50% respiratory variability, suggesting right atrial pressure of 3 mmHg. IAS/Shunts: No atrial level shunt detected by color flow Doppler. Additional Comments: There is a moderate pleural effusion.  LEFT VENTRICLE PLAX 2D LVIDd:         4.00 cm   Diastology LVIDs:         2.60 cm  LV e' medial:    7.07 cm/s LV PW:         1.80 cm   LV E/e' medial:  14.7 LV IVS:        1.50 cm   LV e' lateral:   9.36 cm/s LVOT diam:     2.00 cm   LV E/e' lateral: 11.1 LV SV:         71 LV SV Index:   48 LVOT Area:     3.14 cm  RIGHT VENTRICLE RV Basal diam:  2.70 cm RV Mid diam:    1.50 cm RV S prime:     11.60 cm/s TAPSE (M-mode): 1.7 cm LEFT ATRIUM             Index        RIGHT ATRIUM           Index LA diam:        4.60 cm 3.08 cm/m   RA Area:     16.40 cm LA Vol (A2C):   37.8 ml 25.30 ml/m  RA Volume:   42.20 ml  28.24 ml/m LA Vol (A4C):   70.1 ml 46.92 ml/m LA Biplane Vol: 55.7 ml 37.28 ml/m  AORTIC VALVE AV Area (Vmax):    1.66 cm AV Area (Vmean):   1.86 cm AV Area (VTI):     2.00 cm AV Vmax:           212.00 cm/s AV Vmean:           142.750 cm/s AV VTI:            0.356 m AV Peak Grad:      18.0 mmHg AV Mean Grad:      9.8 mmHg LVOT Vmax:         112.00 cm/s LVOT Vmean:        84.500 cm/s LVOT VTI:          0.226 m LVOT/AV VTI ratio: 0.64 AI PHT:            508 msec  AORTA Ao Root diam: 2.30 cm MITRAL VALVE                TRICUSPID VALVE MV Area (PHT): 2.88 cm     TR Peak grad:   36.0 mmHg MV Decel Time: 263 msec     TR Vmax:        300.00 cm/s MV E velocity: 104.00 cm/s MV A velocity: 33.00 cm/s   SHUNTS MV E/A ratio:  3.15         Systemic VTI:  0.23 m                             Systemic Diam: 2.00 cm Isaias Cowman MD Electronically signed by Isaias Cowman MD Signature Date/Time: 09/09/2022/7:58:45 AM    Final    DG Chest Port 1 View  Result Date: 09/08/2022 CLINICAL DATA:  Acute on chronic congestive heart failure. EXAM: PORTABLE CHEST 1 VIEW COMPARISON:  09/06/22 FINDINGS: Stable cardiomediastinal contours. Increased volume of left pleural effusion. Now moderate to large. Small right pleural effusion is improved in the interval. Similar appearance of mild diffuse interstitial edema. IMPRESSION: 1. Increased volume of left pleural effusion. 2. Improved right pleural effusion. 3. Persistent mild interstitial edema. Electronically Signed   By: Kerby Moors M.D.   On: 09/08/2022 08:16   NM Pulmonary Perfusion  Result Date: 09/07/2022 CLINICAL DATA:  Pulmonary  embolism suspected, low to intermediate probability. Abnormal D-dimer. EXAM: NUCLEAR MEDICINE PERFUSION LUNG SCAN TECHNIQUE: Perfusion images were obtained in multiple projections after intravenous injection of radiopharmaceutical. Ventilation scans intentionally deferred if perfusion scan and chest x-ray adequate for interpretation during COVID 19 epidemic. RADIOPHARMACEUTICALS:  4.17 mCi Tc-73mMAA IV COMPARISON:  Chest radiography yesterday FINDINGS: No segmental or subsegmental defects suggestive of embolic disease. Defects related to layering pleural  effusions. IMPRESSION: No finding to suggest pulmonary emboli. Layering pleural effusions, but no sign of arterial distribution hypoperfusion. Electronically Signed   By: MNelson ChimesM.D.   On: 09/07/2022 08:59   UKoreaVenous Img Lower Bilateral (DVT)  Result Date: 09/06/2022 CLINICAL DATA:  Leg swelling bilateral EXAM: Bilateral LOWER EXTREMITY VENOUS DOPPLER ULTRASOUND TECHNIQUE: Gray-scale sonography with compression, as well as color and duplex ultrasound, were performed to evaluate the deep venous system(s) from the level of the common femoral vein through the popliteal and proximal calf veins. COMPARISON:  None Available. FINDINGS: VENOUS Normal compressibility of the common femoral, superficial femoral, and popliteal veins, as well as the visualized calf veins. Visualized portions of profunda femoral vein and great saphenous vein unremarkable. No filling defects to suggest DVT on grayscale or color Doppler imaging. Doppler waveforms show normal direction of venous flow, normal respiratory plasticity and response to augmentation. Limited views of the contralateral common femoral vein are unremarkable. OTHER Scattered soft tissue edema Limitations: none IMPRESSION: No evidence of bilateral lower extremity DVT.  Soft tissue edema Electronically Signed   By: AJill SideM.D.   On: 09/06/2022 18:57   DG Chest Port 1 View  Result Date: 09/06/2022 CLINICAL DATA:  Shortness of breath EXAM: PORTABLE CHEST 1 VIEW COMPARISON:  Chest x-ray 04/29/2022 FINDINGS: Small bilateral pleural effusions are present, left greater than right. There central pulmonary vascular congestion. There are patchy opacities in the lung bases. The heart is enlarged, unchanged. There is no pneumothorax or acute fracture. Cervical spinal fusion plate is present. IMPRESSION: 1. Cardiomegaly with central pulmonary vascular congestion and small bilateral pleural effusions. 2. Patchy opacities in the lung bases may reflect edema or infection.  Electronically Signed   By: ARonney AstersM.D.   On: 09/06/2022 17:32   MR BRAIN WO CONTRAST  Result Date: 08/18/2022 CLINICAL DATA:  Bilateral visual loss. EXAM: MRI HEAD WITHOUT CONTRAST TECHNIQUE: Multiplanar, multiecho pulse sequences of the brain and surrounding structures were obtained without intravenous contrast. COMPARISON:  Head CT 04/14/2022 and MRI 08/06/2021 FINDINGS: Brain: There is no evidence of an acute infarct, mass, midline shift, or extra-axial fluid collection. A few scattered chronic cerebral microhemorrhages are again noted. T2 hyperintensities in the cerebral white matter and pons have mildly progressed from the prior MRI and are nonspecific but compatible with mild chronic small vessel ischemic disease. Mild cerebral atrophy is within normal limits for age. Vascular: Major intracranial vascular flow voids are preserved. Skull and upper cervical spine: Unremarkable bone marrow signal. Prior anterior cervical spine fusion. Sinuses/Orbits: Bilateral cataract extraction. No significant inflammatory changes in the paranasal sinuses. Clear mastoid air cells. Other: None. IMPRESSION: 1. No acute intracranial abnormality. 2. Mild chronic small vessel ischemic disease, mildly progressed from 2023. Electronically Signed   By: ALogan BoresM.D.   On: 08/18/2022 07:53    ECHO 04/2022  1. Left ventricular ejection fraction, by estimation, is 60 to 65%. The  left ventricle has normal function. The left ventricle has no regional  wall motion abnormalities. There is severe concentric left ventricular  hypertrophy. Left  ventricular diastolic   function could not be evaluated.   2. Right ventricular systolic function is moderately reduced. The right  ventricular size is mildly enlarged. Severely increased right ventricular  wall thickness.   3. Left atrial size was severely dilated.   4. Right atrial size was severely dilated.   5. A small pericardial effusion is present. The pericardial  effusion is  circumferential.   6. The mitral valve is grossly normal. Mild mitral valve regurgitation.   7. The aortic valve is calcified. Aortic valve regurgitation is not  visualized. Aortic valve sclerosis is present, with no evidence of aortic  valve stenosis.   TELEMETRY reviewed by me (LT) 09/13/2022 : Atrial fibrillation rate mid 50s to 60s  EKG reviewed by me: AF rate 107  Data reviewed by me (LT) 09/13/2022: Hospitalist progress note,, last 24h vitals tele labs imaging I/O    Principal Problem:   Acute on chronic diastolic (congestive) heart failure (HCC) Active Problems:   Renal transplant recipient   Dyslipidemia   Chest pain   Elevated d-dimer   Paroxysmal atrial fibrillation ([place])   Type 2 diabetes mellitus with chronic kidney disease, with long-term current use of insulin (HCC)   Hypertensive urgency   Diabetic neuropathy ([place])   Acute on chronic diastolic heart failure ([place])   Protein-calorie malnutrition, severe    ASSESSMENT AND PLAN:  Jean Johnson is a 31yoF with a PMH of paroxysmal AF (flecainide, Eliquis), HFpEF (60-65%, moderate MR 04/2022), ESRD s/p bilateral renal transplants, DM2 who presented to [facility] ED 09/06/2022 with chest tightness, peripheral edema, and orthopnea.  Cardiology is consulted for assistance with her heart failure.  # atypical chest pain  # Acute on chronic HFpEF # moderate LVH # mild AS Presents with 2 weeks of chest tightness she has a difficult time describing, other than finding relief with laying on her left side.  EKG nonacute although in AF RVR on admission,  BNP elevated at 650. Per collateral info from pt's sister in law, some concern for medication noncompliance (prescribed lasix '20mg'$  PO daily). Echo without much change from prior, redemonstrated mod LVH, and G1DD. Volume status improving  -appreciate nephrology assistance, back to PO lasix 40 daily today -stop lisinopril 30 mg daily -other GDMT limited by renal  insufficiency  -Monitor I's/O, low-salt diet  # Paroxysmal atrial fibrillation/flutter Currently rate controlled 60s-90s on telemetry, -continue cardizem to '60mg'$  BID  -agree with changing flecainide to amiodarone with worsening renal function. Decrease load to '200mg'$  BID x 14 days, then '200mg'$  daily thereafter -continue Eliquis from '5mg'$  BID to 2.'5mg'$  BID with Cr increase and baseline weight 49kg for stroke prevention.  # S/p renal transplants # AKI on CKD 3 Chronically immunosuppressed, still makes urine. Followed by nephrology outpatient. Cr baseline (1.29 - 1.33)  -  Cr continues to uptrend from 1.29 on admission to 2.75 today, current GFR 18.  - diuretics per nephrology   # Demand ischemia High-sensitivity troponin borderline elevated but flat trending at 61, 66 In the absence of ischemic EKG changes and in the setting of AoCHF this is most consistent with demand/supply mismatch and not ACS  -Continue atorvastatin  # failure to thrive Poor PO intake  and significant ongoing weakness, essentially bedbound.  - appreciate palliative care assistance    This patient's plan of care was discussed and created with Dr. Clayborn Bigness and he is in agreement.  Signed: Tristan Schroeder , PA-C 09/13/2022, 12:50 PM [facility]

## 2022-09-13 NOTE — Plan of Care (Signed)
  Problem: Education: Goal: Ability to demonstrate management of disease process will improve Outcome: Not Progressing Goal: Ability to verbalize understanding of medication therapies will improve Outcome: Not Progressing Goal: Individualized Educational Video(s) Outcome: Not Progressing   Problem: Activity: Goal: Capacity to carry out activities will improve Outcome: Not Progressing   Problem: Cardiac: Goal: Ability to achieve and maintain adequate cardiopulmonary perfusion will improve Outcome: Not Progressing   Problem: Education: Goal: Ability to describe self-care measures that may prevent or decrease complications (Diabetes Survival Skills Education) will improve Outcome: Not Progressing Goal: Individualized Educational Video(s) Outcome: Not Progressing   Problem: Coping: Goal: Ability to adjust to condition or change in health will improve Outcome: Not Progressing   Problem: Fluid Volume: Goal: Ability to maintain a balanced intake and output will improve Outcome: Not Progressing   Problem: Health Behavior/Discharge Planning: Goal: Ability to identify and utilize available resources and services will improve Outcome: Not Progressing Goal: Ability to manage health-related needs will improve Outcome: Not Progressing   Problem: Metabolic: Goal: Ability to maintain appropriate glucose levels will improve Outcome: Not Progressing   Problem: Nutritional: Goal: Maintenance of adequate nutrition will improve Outcome: Not Progressing Goal: Progress toward achieving an optimal weight will improve Outcome: Not Progressing   Problem: Skin Integrity: Goal: Risk for impaired skin integrity will decrease Outcome: Not Progressing   Problem: Tissue Perfusion: Goal: Adequacy of tissue perfusion will improve Outcome: Not Progressing   Problem: Education: Goal: Knowledge of General Education information will improve Description: Including pain rating scale,  medication(s)/side effects and non-pharmacologic comfort measures Outcome: Not Progressing   Problem: Health Behavior/Discharge Planning: Goal: Ability to manage health-related needs will improve Outcome: Not Progressing   Problem: Clinical Measurements: Goal: Ability to maintain clinical measurements within normal limits will improve Outcome: Not Progressing Goal: Will remain free from infection Outcome: Not Progressing Goal: Diagnostic test results will improve Outcome: Not Progressing Goal: Respiratory complications will improve Outcome: Not Progressing Goal: Cardiovascular complication will be avoided Outcome: Not Progressing   Problem: Activity: Goal: Risk for activity intolerance will decrease Outcome: Not Progressing   Problem: Nutrition: Goal: Adequate nutrition will be maintained Outcome: Not Progressing   Problem: Coping: Goal: Level of anxiety will decrease Outcome: Not Progressing   Problem: Elimination: Goal: Will not experience complications related to bowel motility Outcome: Not Progressing Goal: Will not experience complications related to urinary retention Outcome: Not Progressing   Problem: Pain Managment: Goal: General experience of comfort will improve Outcome: Not Progressing   Problem: Safety: Goal: Ability to remain free from injury will improve Outcome: Not Progressing   Problem: Skin Integrity: Goal: Risk for impaired skin integrity will decrease Outcome: Not Progressing   

## 2022-09-13 NOTE — Progress Notes (Signed)
[facility]  ROUNDING NOTE   Subjective:   Jean Johnson is a 72 y.o. female with past medical conditions including diabetes, atrial fibrillation on Eliquis, dyslipidemia, hypertension, end-stage renal disease status post renal transplant x 2, diabetic neuropathy, and PUD.  Patient presents to the emergency department with complaints of shortness of breath and chest pain.  Patient has been admitted for Acute on chronic diastolic heart failure (HCC) [I50.33] Acute on chronic diastolic (congestive) heart failure (HCC) [I50.33] Dyspnea, unspecified type [R06.00]  Patient is known to our practice and is followed outpatient by Dr. Lanora Manis.  Patient was last seen in office on 08/23/2022 for routine follow-up.    Patient seen laying in bed, eyes closed Alert States she feels fair Denies pain and discomfort.   Objective:  Vital signs in last 24 hours:  Temp:  [98.8 F (37.1 C)-100 F (37.8 C)] 99.1 F (37.3 C) (03/05 0753) Pulse Rate:  [70-86] 83 (03/05 0753) Resp:  [18-20] 18 (03/05 0753) BP: (92-128)/(41-65) 128/64 (03/05 0753) SpO2:  [77 %-100 %] 100 % (03/05 0753)  Weight change:  Filed Weights   09/09/22 1347 09/10/22 0600 09/11/22 0334  Weight: 50.9 kg 52.7 kg 50.4 kg    Intake/Output: I/O last 3 completed shifts: In: 96.8 [I.V.:96.8] Out: 825 [Urine:825]   Intake/Output this shift:  Total I/O In: 240 [P.O.:240] Out: -   Physical Exam: General: NAD  Head: Normocephalic, atraumatic. Moist oral mucosal membranes  Eyes: Anicteric  Lungs:  Clear to auscultation, normal effort  Heart: Regular rate and rhythm  Abdomen:  Soft, nontender, nondistended  Extremities: trace peripheral edema.  Neurologic: Nonfocal, moving all four extremities  Skin: No lesions  Access: None    Basic Metabolic Panel: Recent Labs  Lab 09/06/22 2042 09/07/22 0316 09/08/22 0541 09/09/22 0556 09/10/22 0533 09/11/22 0507 09/12/22 0628 09/13/22 0653  NA  --    < >  142 141 138 140 141 139  K  --    < > 3.8 3.7 3.8 3.7 4.2 4.0  CL  --    < > 109 109 109 108 107 108  CO2  --    < > 24 23 21* '25 24 25  '$ GLUCOSE  --    < > 92 86 151* 88 97 88  BUN  --    < > 41* 44* 44* 43* 44* 50*  CREATININE  --    < > 1.52* 1.83* 2.05* 2.24* 2.44* 2.75*  CALCIUM  --    < > 8.2* 7.8* 8.0* 8.0* 8.0* 8.2*  MG 2.1  --  1.8 1.9  --   --   --   --   PHOS  --   --  3.7  --   --   --   --   --    < > = values in this interval not displayed.     Liver Function Tests: Recent Labs  Lab 09/07/22 0316 09/08/22 0541  AST 13* 14*  ALT 8 7  ALKPHOS 60 57  BILITOT 0.8 0.7  PROT 4.3* 4.3*  ALBUMIN 2.1* 2.0*    No results for input(s): "LIPASE", "AMYLASE" in the last 168 hours. No results for input(s): "AMMONIA" in the last 168 hours.  CBC: Recent Labs  Lab 09/08/22 0541 09/10/22 0533 09/11/22 0507 09/12/22 0628 09/13/22 0653  WBC 11.2* 8.6 8.7 9.5 8.3  NEUTROABS 9.6* 6.5 6.4 6.9 6.1  HGB 11.1* 10.1* 10.1* 9.9* 10.0*  HCT 35.4* 33.0* 32.3* 31.7*  32.4*  MCV 97.5 98.5 98.2 97.8 98.2  PLT 105* 101* 110* 120* 125*     Cardiac Enzymes: No results for input(s): "CKTOTAL", "CKMB", "CKMBINDEX", "TROPONINI" in the last 168 hours.  BNP: Invalid input(s): "POCBNP"  CBG: Recent Labs  Lab 09/12/22 1401 09/12/22 1618 09/12/22 2053 09/13/22 0051 09/13/22 0755  GLUCAP 140* 112* 71 90 88     Microbiology: Results for orders placed or performed during the hospital encounter of 08/29/22  Surgical pcr screen     Status: None   Collection Time: 08/29/22 11:08 AM   Specimen: Nasal Mucosa; Nasal Swab  Result Value Ref Range Status   MRSA, PCR NEGATIVE NEGATIVE Final   Staphylococcus aureus NEGATIVE NEGATIVE Final    Comment: (NOTE) The Xpert SA Assay (FDA approved for NASAL specimens in patients 34 years of age and older), is one component of a comprehensive surveillance program. It is not intended to diagnose infection nor to guide or monitor  treatment. Performed at [facility], [address] [place], [place] [address]     Coagulation Studies: No results for input(s): "LABPROT", "INR" in the last 72 hours.  Urinalysis: No results for input(s): "COLORURINE", "LABSPEC", "PHURINE", "GLUCOSEU", "HGBUR", "BILIRUBINUR", "KETONESUR", "PROTEINUR", "UROBILINOGEN", "NITRITE", "LEUKOCYTESUR" in the last 72 hours.  Invalid input(s): "APPERANCEUR"     Imaging: No results found.   Medications:      amiodarone  200 mg Oral BID   Followed by   Derrill Memo ON 09/27/2022] amiodarone  200 mg Oral Daily   apixaban  2.5 mg Oral BID   vitamin C  500 mg Oral BID   atorvastatin  10 mg Oral Daily   Chlorhexidine Gluconate Cloth  6 each Topical Q0600   diltiazem  60 mg Oral Q12H   docusate sodium  100 mg Oral BID   ezetimibe  10 mg Oral Daily   feeding supplement  237 mL Oral TID BM   [START ON 09/14/2022] furosemide  40 mg Oral Daily   insulin aspart  0-5 Units Subcutaneous QHS   insulin aspart  0-9 Units Subcutaneous TID WC   multivitamin with minerals  1 tablet Oral Daily   mycophenolate  500 mg Oral BID   mouth rinse  15 mL Mouth Rinse 4 times per day   potassium chloride SA  20 mEq Oral Daily   pregabalin  50 mg Oral BID   tacrolimus  3 mg Oral BID   zinc sulfate  220 mg Oral Daily   acetaminophen **OR** acetaminophen, iohexol, labetalol, magnesium hydroxide, morphine injection, nitroGLYCERIN, ondansetron **OR** ondansetron (ZOFRAN) IV, mouth rinse, traZODone  Assessment/ Plan:  [name] is a 72 y.o.  female  with past medical conditions including diabetes, atrial fibrillation on Eliquis, dyslipidemia, hypertension, end-stage renal disease status post renal transplant x 2, diabetic neuropathy, and PUD.  Patient presents to the emergency department with complaints of shortness of breath and chest pain.  Patient has been admitted for Acute on chronic diastolic heart failure (HCC) [I50.33] Acute on chronic  diastolic (congestive) heart failure (HCC) [I50.33] Dyspnea, unspecified type [R06.00]   Acute Kidney Injury on chronic kidney disease stage IIIAt with baseline creatinine 1.27 and GFR of 45 on 08/18/22.  Acute kidney injury secondary to cardiorenal syndrome Chronic kidney disease is secondary to diabetes, hypertension and CHF. No exposure to IV contrast. Furosemide drip started in ED.   Creatinine rose today, Urine output 829m in past 24 hours. Appears euvolemic on exam.   Lab Results  Component Value Date   CREATININE 2.75 (H) 09/13/2022   CREATININE 2.44 (H) 09/12/2022   CREATININE 2.24 (H) 09/11/2022    Intake/Output Summary (Last 24 hours) at 09/13/2022 1140 Last data filed at 09/13/2022 0900 Gross per 24 hour  Intake 253.85 ml  Output 825 ml  Net -571.15 ml    2. Anemia of chronic kidney disease  Lab Results  Component Value Date   HGB 10.0 (L) 09/13/2022    Hemoglobin stable  3. Secondary Hyperparathyroidism: with outpatient labs: PTH 141, phosphorus 3.2, calcium 9.7 on 08/18/22.   Lab Results  Component Value Date   CALCIUM 8.2 (L) 09/13/2022   CAION 1.25 08/30/2022   PHOS 3.7 09/08/2022    Will continue to monitor bone minerals during this admission. 4. Diabetes mellitus type II with chronic kidney disease/renal manifestations: insulin/noninsulin dependent. Home regimen includes Humalog and levemir. Most recent hemoglobin A1c is 5.7 on 04/14/22.   Glucose well controlled. Primary team to manage sliding scale insulin.  5. Acute on Chronic diastolic heart failure. ECHO on 09/08/22 shows EF 60-65% with a grade 1 diastolic dysfunction, mild AVR with stenosis.   Transitioned to oral diuretics on 09/12/22. Due to euvolemic state, will decrease to Furosemide '40mg'$  daily. Cardiology considering weaning amiodarone.     LOS: 7   3/5/202411:40 AM

## 2022-09-13 NOTE — Progress Notes (Signed)
Progress Note   Patient: Jean Johnson [id number] DOB: Jul 17, 1950 DOA: 09/06/2022     7 DOS: the patient was seen and examined on 09/13/2022    Subjective:  Seen and examined at bedside this morning Continues to be lethargic however appeared more awake today  Denies chest pain, nausea vomiting or abdominal pain Palliative team have seen patient today Patient elected to be full code at this time     Brief Narrative:  72 year old African-American female with past medical history significant for ESRD s/p renal transplant x 2 now having CKD stage IIIb, hypertension, dyslipidemia, hypothyroidism, PAF on anticoagulation with Eliquis, PUD, type 2 diabetes mellitus with diabetic neuropathy as well as other comorbidities who presented to the ED with acute worsening of her dyspnea associate with chest discomfort that progressively worsened over the last few weeks but significantly worsened. She has had associated worsening lower extremity swelling and edema without pain as well as orthopnea.  She has had diminished urine output without dysuria urinary frequency or flank pain.  Denies any cough or hemoptysis or wheezing.  Has been having occasional palpitations and she feels that it is related to her proximal. Her D-dimer slightly elevated and a CTA of the chest was done to be ordered to evaluate for PE but given her renal function VQ scan was pursued instead and showed low probability for PE.      Assessment & Plan:   Principal Problem:   Acute on chronic diastolic (congestive) heart failure (HCC) Active Problems:   Hypertensive urgency   Chest pain   Elevated d-dimer   Renal transplant recipient   Dyslipidemia   Paroxysmal atrial fibrillation (HCC)   Type 2 diabetes mellitus with chronic kidney disease, with long-term current use of insulin (HCC)   Diabetic neuropathy (HCC)   Acute on chronic diastolic heart failure (HCC)   Acute on chronic diastolic congestive heart failure Presents  with 2 weeks of chest tightness with only relieved by lying on her left side.   BNP elevated despite adherence to home Lasix 20 mg daily Remains clinically volume overloaded Plan: Nephrology team and transitioning Lasix drip to oral Lasix on 09/12/2022 ACE inhibitor on hold at this time in the setting of increasing creatinine Strict I's and O's, low-salt diet, daily weights Echo October 2023 showed ejection fraction 60 to 65% with moderate MR   Hypertensive urgency Blood pressure improved control.   Lasix drip currently off and patient is on oral Lasix   Paroxysmal atrial fibrillation with rapid ventricular response Ventricular rate have improved.  C Plan: Continue Eliquis 5 mg twice daily Cardizem 120 mg twice daily Given worsening renal function flecainide have been switched to amiodarone     ESRD s/p renal transplant x 2 Chronic immunosuppression Acute on chronic renal failure likely multifactorial in the setting of diuresis as well as hepatorenal syndrome Still makes urine.  Followed by nephrology outpatient.  Baseline creatinine 1.29-1.33 Plan: Continue immunosuppressive regimen of CellCept and tacrolimus Nephrology on board we appreciate recommendations   Elevated troponin likely demand ischemia Dyslipidemia Borderline elevation.  Flat trend Not indicative of ACS Continue continue aspirin and statin therapy   Diabetic neuropathy (HCC) Continue with Pregabalin 50 mg p.o. twice daily   Type 2 diabetes mellitus with chronic kidney disease, with long-term current use of insulin (HCC) continue insulin therapy -Continue to Monitor CBG's per Protocol     DVT prophylaxis: Eliquis   Code Status: Full code   Family Communication: Plan of care discussed with  patient's husband at bedside   Disposition Plan: Status is: Inpatient       Level of care: Progressive   Consultants:  Cardiology- [facility]- Dr. Juleen China   Procedures:  None       Physical  Exam: General: NAD, frail  Head: Normocephalic, atraumatic. Moist oral mucosal membranes  Eyes: Anicteric  Lungs:  Clear to auscultation, normal effort  Heart: Regular rate and rhythm  Abdomen:  Soft, nontender, nondistended  Extremities: 1+ peripheral edema.  Neurologic: Nonfocal, moving all four extremities  Skin: No lesions         Data Reviewed:   I have reviewed the patient's CBC and bMP   Time spent: 36 minutes   Vitals:   09/13/22 0434 09/13/22 0437 09/13/22 0753 09/13/22 1223  BP:  (!) 97/59 128/64 (!) 105/55  Pulse:  80 83 91  Resp: '19 18 18 18  '$ Temp:  98.8 F (37.1 C) 99.1 F (37.3 C) 99.2 F (37.3 C)  TempSrc:      SpO2:  100% 100% 94%  Weight:      Height:        Author: Verline Lema, MD 09/13/2022 5:10 PM  For on call review [email or web address].

## 2022-09-13 NOTE — TOC Progression Note (Signed)
Transition of Care [facility]) - Progression Note    Patient Details  Name: Jean Johnson MRN: [id number] Date of Birth: 02-May-1951  Transition of Care [facility]) CM/SW Contact  Laurena Slimmer, RN Phone Number: 09/13/2022, 4:18 PM  Clinical Narrative:    Spoke with patient's sister in law, Eyvonne Mechanic at the bedside to update discharge plan. Patient daughter in law had concerns patient is not getting medications when she is home. Patient sister in law feels patient is better off at SNF. She was advised stay would be for STR if that was recommended and that recommendation was for [facility].    Expected Discharge Plan: [place] Barriers to Discharge: Continued Medical Work up  Expected Discharge Plan and Services       Living arrangements for the past 2 months: [place]: [place] ([place]) Date [place]: 09/08/22 Time [place]: [place] Representative spoke with at [place]: [place] ([place]) Interventions SDOH Screenings   Food Insecurity: No Food Insecurity (09/07/2022)  Housing: Low Risk  (09/07/2022)  Transportation Needs: No Transportation Needs (09/07/2022)  Utilities: Not At Risk (09/07/2022)  Tobacco Use: Low Risk  (09/10/2022)    Readmission Risk Interventions    09/08/2022    4:23 PM 04/27/2022    3:34 PM  Readmission Risk Prevention Plan  Transportation Screening Complete Complete  HRI or Home Care Consult  Complete  Social Work Consult for [place] Planning/Counseling  Complete  Palliative Care Screening  Not Applicable  Medication Review Press photographer) Complete Complete  PCP or Specialist appointment within 3-5 days of discharge Complete   HRI or [place] Complete   SW Recovery Care/Counseling Consult Complete   Palliative Care Screening Not Complete   Skilled Nursing Facility Complete

## 2022-09-13 NOTE — Consult Note (Signed)
Consultation Note Date: 09/13/2022   Patient Name: Jean Johnson  DOB: [birth year]  MRN: [id number]  Age / Sex: 72 y.o., female  PCP: Gladstone Lighter, MD Referring Physician: Verline Lema, MD  Reason for Consultation: Establishing goals of care  HPI/Patient Profile: 72 year old African-American female with past medical history significant for ESRD s/p renal transplant x 2 now having CKD stage IIIb, hypertension, dyslipidemia, hypothyroidism, PAF on anticoagulation with Eliquis, PUD, type 2 diabetes mellitus with diabetic neuropathy as well as other comorbidities who presented to the ED with acute worsening of her dyspnea associate with chest discomfort that progressively worsened over the last few weeks but significantly worsened. She has had associated worsening lower extremity swelling and edema without pain as well as orthopnea.  She has had diminished urine output without dysuria urinary frequency or flank pain.  Denies any cough or hemoptysis or wheezing.  Has been having occasional palpitations and she feels that it is related to her proximal. Her D-dimer slightly elevated and a CTA of the chest was done to be ordered to evaluate for PE but given her renal function VQ scan was pursued instead and showed low probability for PE.      Clinical Assessment and Goals of Care: Notes and labs reviewed.  Into see patient.  She is resting in bed with eyes closed.  Her sister-in-law was at bedside.  Sister discusses that patient lives with her husband at baseline.  She states that she helps to provide care for her, and they have grown close over the years.  She states they used to go to casinos and to other places together and had a lot of fun.  She states patient began having difficulty walking around 3 years ago, and slowly declined until her admission for COVID in September 2023.  She states following that  hospitalization she was sent to rehab and had a profound decline following this.  She states patient has bedbound at baseline, with recent skin breakdown noted.  She states she has not been eating and drinking well, and intermittently misses some of her medications.   She discusses that she has tried to motivate the patient by telling her to keep going and to keep trying, but she states she just wants her sister-in-law to be happy, and not to suffer.  Patient awoke and goals of care were discussed.  We discussed her diagnosis, prognosis, GOC, EOL wishes disposition and options.  Created space and opportunity for patient  to explore thoughts and feelings regarding current medical information.   A detailed discussion was had today regarding advanced directives.  Concepts specific to code status, artifical feeding and hydration, IV antibiotics and rehospitalization were discussed.  The difference between an aggressive medical intervention path and a comfort care path was discussed.  Values and goals of care important to patient and family were attempted to be elicited.  Discussed limitations of medical interventions to prolong quality of life in some situations and discussed the concept of human mortality.  Patient states she  is not ready to die.  She states she worries for her husband and other family members.  She states she would not really want to have a feeding tube placed, or be placed on a ventilator, but would do what is needed including these interventions to live longer.  She is unable to articulate a situation where she would want her family to withdraw care and shift to comfort focus.  Discussed the importance of sister-in-law Sunday Spillers, and other family members knowing patient's desires and limits to care.  Encourage further conversation.  PMT will return to follow-up tomorrow.  SUMMARY OF RECOMMENDATIONS   Continue full code/full scope. PMT will follow-up tomorrow.  Prognosis:  Poor  overall       Primary Diagnoses: Present on Admission:  Acute on chronic diastolic (congestive) heart failure ([place])  Dyslipidemia   I have reviewed the medical record, interviewed the patient and family, and examined the patient. The following aspects are pertinent.  Past Medical History:  Diagnosis Date   Anemia of chronic renal disease    Aortic stenosis, mild    a.) TTE on 11/23/2020 --> mean gradient 9.7 mmHg   Atrial fibrillation (HCC)    a.) CHA2DS2VASc = 4 (age, sex, HTN, T2DM);  b.) rate/rhythm maintained on oral diltiazem + flecainide; chronically anticoagulated with apixaban   B12 deficiency    Cervical spinal stenosis    Diabetic neuropathy (HCC)    Diverticulosis    Dyspnea    ESRD (end stage renal disease) (HCC)    First degree AV block    Gastrointestinal ulcer    HLD (hyperlipidemia)    Hypertension    Hypothyroidism    Incomplete right bundle branch block (RBBB)    LAE (left atrial enlargement)    a.) TTE on 11/23/2020 --> moderate   Left thyroid nodule    a.) cervical MRI on 12/29/2020 --> measured "at least" 3.5 cm; incompletely imaged.   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)    Pyelonephritis    Renal transplant recipient    a.) living donor transplant from sister on 12/26/1998; rejected organ in 2006 and restarted on hemodialysis. 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   Valvular regurgitation    a.) TTE 11/23/2020 --> mild panvalvular regurgitation; b.) TTE 04/29/2022: mild MR   Social History   Socioeconomic History   Marital status: Married    Spouse name: Dance movement psychotherapist   Number of children: Not on file   Years of education: Not  on file   Highest education level: Not on file  Occupational History   Not on file  Tobacco Use   Smoking status: Never   Smokeless tobacco: Never  Vaping Use   Vaping Use: Never used  Substance and Sexual Activity   Alcohol use: Not Currently   Drug use: Never   Sexual activity: Not Currently  Other Topics Concern   Not on file  Social History Narrative   Not on file   Social Determinants of Health   Financial Resource Strain: Not on file  Food Insecurity: No Food Insecurity (09/07/2022)   Hunger Vital Sign    Worried About Running Out of Food in the  Last Year: Never true    [place] in the Last Year: Never true  Transportation Needs: No Transportation Needs (09/07/2022)   PRAPARE - Hydrologist (Medical): No    Lack of Transportation (Non-Medical): No  Physical Activity: Not on file  Stress: Not on file  Social Connections: Not on file   Family History  Problem Relation Age of Onset   Diabetes Father    Heart disease Father    Scheduled Meds:  amiodarone  200 mg Oral BID   Followed by   Derrill Memo ON 09/27/2022] amiodarone  200 mg Oral Daily   apixaban  2.5 mg Oral BID   vitamin C  500 mg Oral BID   atorvastatin  10 mg Oral Daily   Chlorhexidine Gluconate Cloth  6 each Topical Q0600   diltiazem  60 mg Oral Q12H   docusate sodium  100 mg Oral BID   ezetimibe  10 mg Oral Daily   feeding supplement  237 mL Oral TID BM   [START ON 09/14/2022] furosemide  40 mg Oral Daily   insulin aspart  0-5 Units Subcutaneous QHS   insulin aspart  0-9 Units Subcutaneous TID WC   multivitamin with minerals  1 tablet Oral Daily   mycophenolate  500 mg Oral BID   mouth rinse  15 mL Mouth Rinse 4 times per day   potassium chloride SA  20 mEq Oral Daily   pregabalin  50 mg Oral BID   tacrolimus  3 mg Oral BID   zinc sulfate  220 mg Oral Daily   Continuous Infusions: PRN Meds:.acetaminophen **OR** acetaminophen, iohexol, labetalol, magnesium hydroxide,  morphine injection, nitroGLYCERIN, ondansetron **OR** ondansetron (ZOFRAN) IV, mouth rinse, traZODone Medications Prior to Admission:  Prior to Admission medications   Medication Sig Start Date End Date Taking? Authorizing Provider  alendronate (FOSAMAX) 70 MG tablet Take 70 mg by mouth once a week. monday   Yes [provider]  apixaban (ELIQUIS) 5 MG TABS tablet Take 5 mg by mouth 2 (two) times daily.   Yes [provider]  atorvastatin (LIPITOR) 10 MG tablet Take 10 mg by mouth daily.   Yes [provider]  diltiazem (CARDIZEM CD) 240 MG 24 hr capsule Take 240 mg by mouth at bedtime.   Yes [provider]  ezetimibe (ZETIA) 10 MG tablet Take 10 mg by mouth daily.   Yes [provider]  flecainide (TAMBOCOR) 100 MG tablet Take 100 mg by mouth every 12 (twelve) hours.   Yes [provider]  furosemide (LASIX) 20 MG tablet Take 20 mg by mouth 2 (two) times daily as needed.   Yes [provider]  insulin detemir (LEVEMIR) 100 UNIT/ML injection Inject 8 Units into the skin at bedtime.   Yes [provider]  insulin lispro (HUMALOG) 100 UNIT/ML injection Inject 5 Units into the skin 2 (two) times daily.   Yes [provider]  lisinopril (ZESTRIL) 30 MG tablet Take 30 mg by mouth daily.   Yes [provider]  mycophenolate (CELLCEPT) 500 MG tablet Take 500 mg by mouth 2 (two) times daily.   Yes [provider]  Potassium Chloride ER 20 MEQ TBCR Take 1 tablet by mouth daily as needed. 07/29/22  Yes [provider]  predniSONE (DELTASONE) 20 MG tablet Take 20 mg by mouth 3 (three) times daily.   Yes [provider]  pregabalin (LYRICA) 50 MG capsule Take 1 capsule (50 mg  total) by mouth 2 (two) times daily. 05/01/22  Yes Emeterio Reeve, DO  tacrolimus (PROGRAF) 1 MG capsule Take 3 mg by mouth 2 (two) times daily.   Yes [provider]  traZODone (DESYREL) 50 MG tablet Take 0.5  tablets (25 mg total) by mouth at bedtime as needed for sleep. 05/01/22  Yes Emeterio Reeve, DO   Allergies  Allergen Reactions   E-Mycin [Erythromycin] Swelling    And itching    Penicillins Swelling    Throat Tolerated 1st generation cephalosporin (CEFAZOLIN) on 03/24/2021 without documented ADRs.  No reaction with Ancef on 08/30/2022   Review of Systems  Musculoskeletal:        Leg discomfort    Physical Exam Pulmonary:     Effort: Pulmonary effort is normal.  Neurological:     Mental Status: She is alert.     Vital Signs: BP (!) 105/55 (BP Location: Right Arm)   Pulse 91   Temp 99.2 F (37.3 C)   Resp 18   Ht '5\' 2"'$  (1.575 m)   Wt 50.4 kg   SpO2 94%   BMI 20.32 kg/m  Pain Scale: 0-10   Pain Score: 0-No pain   SpO2: SpO2: 94 % O2 Device:SpO2: 94 % O2 Flow Rate: .   IO: Intake/output summary:  Intake/Output Summary (Last 24 hours) at 09/13/2022 1235 Last data filed at 09/13/2022 0900 Gross per 24 hour  Intake 253.85 ml  Output 500 ml  Net -246.15 ml    LBM: Last BM Date : 09/05/22 Baseline Weight: Weight: 46.7 kg Most recent weight: Weight: 50.4 kg       Signed by: Asencion Gowda, NP   Please contact Palliative Medicine Team phone at [phone] for questions and concerns.  For individual provider: See Shea Evans

## 2022-09-14 DIAGNOSIS — I5033 Acute on chronic diastolic (congestive) heart failure: Secondary | ICD-10-CM | POA: Diagnosis not present

## 2022-09-14 DIAGNOSIS — Z7189 Other specified counseling: Secondary | ICD-10-CM | POA: Diagnosis not present

## 2022-09-14 LAB — CBC WITH DIFFERENTIAL/PLATELET
Abs Immature Granulocytes: 0.04 K/uL (ref 0.00–0.07)
Basophils Absolute: 0 K/uL (ref 0.0–0.1)
Basophils Relative: 0 %
Eosinophils Absolute: 0.1 K/uL (ref 0.0–0.5)
Eosinophils Relative: 1 %
HCT: 28.8 % — ABNORMAL LOW (ref 36.0–46.0)
Hemoglobin: 9.2 g/dL — ABNORMAL LOW (ref 12.0–15.0)
Immature Granulocytes: 1 %
Lymphocytes Relative: 16 %
Lymphs Abs: 1.2 K/uL (ref 0.7–4.0)
MCH: 30.5 pg (ref 26.0–34.0)
MCHC: 31.9 g/dL (ref 30.0–36.0)
MCV: 95.4 fL (ref 80.0–100.0)
Monocytes Absolute: 0.7 K/uL (ref 0.1–1.0)
Monocytes Relative: 9 %
Neutro Abs: 5.4 K/uL (ref 1.7–7.7)
Neutrophils Relative %: 73 %
Platelets: 135 K/uL — ABNORMAL LOW (ref 150–400)
RBC: 3.02 MIL/uL — ABNORMAL LOW (ref 3.87–5.11)
RDW: 17 % — ABNORMAL HIGH (ref 11.5–15.5)
WBC: 7.3 K/uL (ref 4.0–10.5)
nRBC: 0 % (ref 0.0–0.2)

## 2022-09-14 LAB — GLUCOSE, CAPILLARY
Glucose-Capillary: 80 mg/dL (ref 70–99)
Glucose-Capillary: 85 mg/dL (ref 70–99)
Glucose-Capillary: 78 mg/dL (ref 70–99)
Glucose-Capillary: 137 mg/dL — ABNORMAL HIGH (ref 70–99)
Glucose-Capillary: 110 mg/dL — ABNORMAL HIGH (ref 70–99)
Glucose-Capillary: 98 mg/dL (ref 70–99)

## 2022-09-14 LAB — BASIC METABOLIC PANEL WITH GFR
Anion gap: 9 (ref 5–15)
BUN: 52 mg/dL — ABNORMAL HIGH (ref 8–23)
CO2: 24 mmol/L (ref 22–32)
Calcium: 8.2 mg/dL — ABNORMAL LOW (ref 8.9–10.3)
Chloride: 109 mmol/L (ref 98–111)
Creatinine, Ser: 2.75 mg/dL — ABNORMAL HIGH (ref 0.44–1.00)
GFR, Estimated: 18 mL/min — ABNORMAL LOW
Glucose, Bld: 83 mg/dL (ref 70–99)
Potassium: 4 mmol/L (ref 3.5–5.1)
Sodium: 142 mmol/L (ref 135–145)

## 2022-09-14 MED ORDER — PREGABALIN 50 MG PO CAPS
50.0000 mg | ORAL_CAPSULE | Freq: Every day | ORAL | Status: DC
  Administered 2022-09-15 – 2022-09-24 (×10): 50 mg via ORAL
  Filled 2022-09-14 (×10): qty 1

## 2022-09-14 MED ORDER — TACROLIMUS 1 MG PO CAPS
2.0000 mg | ORAL_CAPSULE | Freq: Two times a day (BID) | ORAL | Status: DC
  Administered 2022-09-14 – 2022-09-18 (×8): 2 mg via ORAL
  Filled 2022-09-14 (×9): qty 2

## 2022-09-14 NOTE — Care Plan (Signed)
Patient transferred safely with all belongings at side to unit 2C, room 222a.  Report given to Lou[place]za, Therapist, sports.  Patient's primary contact was called to update on transfer however, no one answered call.  Will retry later.  POC maintained.

## 2022-09-14 NOTE — Progress Notes (Addendum)
[facility]  ROUNDING NOTE   Subjective:   Jean Johnson is a 72 y.o. female with past medical conditions including diabetes, atrial fibrillation on Eliquis, dyslipidemia, hypertension, end-stage renal disease status post renal transplant x 2, diabetic neuropathy, and PUD.  Patient presents to the emergency department with complaints of shortness of breath and chest pain.  Patient has been admitted for Acute on chronic diastolic heart failure (HCC) [I50.33] Acute on chronic diastolic (congestive) heart failure (HCC) [I50.33] Dyspnea, unspecified type [R06.00]  Patient is known to our practice and is followed outpatient by Dr. Lanora Manis.  Patient was last seen in office on 08/23/2022 for routine follow-up.    Patient seen laying in bed Sister-in-law at bedside Breakfast tray just arrived. Patient states she feels well, less interactive today Denies pain or discomfort Remains on room air No lower extremity edema  Objective:  Vital signs in last 24 hours:  Temp:  [98.3 F (36.8 C)-99.2 F (37.3 C)] 98.6 F (37 C) (03/06 1200) Pulse Rate:  [68-91] 88 (03/06 1200) Resp:  [16-22] 16 (03/06 1200) BP: (105-126)/(51-79) 119/79 (03/06 1200) SpO2:  [90 %-100 %] 100 % (03/06 1200)  Weight change:  Filed Weights   09/09/22 1347 09/10/22 0600 09/11/22 0334  Weight: 50.9 kg 52.7 kg 50.4 kg    Intake/Output: I/O last 3 completed shifts: In: 360 [P.O.:360] Out: 1450 [Urine:1450]   Intake/Output this shift:  Total I/O In: 480 [P.O.:480] Out: -   Physical Exam: General: NAD  Head: Normocephalic, atraumatic. Moist oral mucosal membranes  Eyes: Anicteric  Lungs:  Clear to auscultation, normal effort  Heart: Regular rate and rhythm  Abdomen:  Soft, nontender, nondistended  Extremities: trace peripheral edema.  Neurologic: Nonfocal, moving all four extremities  Skin: No lesions  Access: None    Basic Metabolic Panel: Recent Labs  Lab 09/08/22 0541  09/09/22 0556 09/10/22 0533 09/11/22 0507 09/12/22 0628 09/13/22 0653 09/14/22 0340  NA 142 141 138 140 141 139 142  K 3.8 3.7 3.8 3.7 4.2 4.0 4.0  CL 109 109 109 108 107 108 109  CO2 24 23 21* '25 24 25 24  '$ GLUCOSE 92 86 151* 88 97 88 83  BUN 41* 44* 44* 43* 44* 50* 52*  CREATININE 1.52* 1.83* 2.05* 2.24* 2.44* 2.75* 2.75*  CALCIUM 8.2* 7.8* 8.0* 8.0* 8.0* 8.2* 8.2*  MG 1.8 1.9  --   --   --   --   --   PHOS 3.7  --   --   --   --   --   --      Liver Function Tests: Recent Labs  Lab 09/08/22 0541  AST 14*  ALT 7  ALKPHOS 57  BILITOT 0.7  PROT 4.3*  ALBUMIN 2.0*    No results for input(s): "LIPASE", "AMYLASE" in the last 168 hours. No results for input(s): "AMMONIA" in the last 168 hours.  CBC: Recent Labs  Lab 09/10/22 0533 09/11/22 0507 09/12/22 0628 09/13/22 0653 09/14/22 0340  WBC 8.6 8.7 9.5 8.3 7.3  NEUTROABS 6.5 6.4 6.9 6.1 5.4  HGB 10.1* 10.1* 9.9* 10.0* 9.2*  HCT 33.0* 32.3* 31.7* 32.4* 28.8*  MCV 98.5 98.2 97.8 98.2 95.4  PLT 101* 110* 120* 125* 135*     Cardiac Enzymes: No results for input(s): "CKTOTAL", "CKMB", "CKMBINDEX", "TROPONINI" in the last 168 hours.  BNP: Invalid input(s): "POCBNP"  CBG: Recent Labs  Lab 09/13/22 2312 09/14/22 0117 09/14/22 0531 09/14/22 0827 09/14/22 1203  GLUCAP 82  56 85 64 137*     Microbiology: Results for orders placed or performed during the hospital encounter of 08/29/22  Surgical pcr screen     Status: None   Collection Time: 08/29/22 11:08 AM   Specimen: Nasal Mucosa; Nasal Swab  Result Value Ref Range Status   MRSA, PCR NEGATIVE NEGATIVE Final   Staphylococcus aureus NEGATIVE NEGATIVE Final    Comment: (NOTE) The Xpert SA Assay (FDA approved for NASAL specimens in patients 34 years of age and older), is one component of a comprehensive surveillance program. It is not intended to diagnose infection nor to guide or monitor treatment. Performed at [facility], [address] [place], [place] [address]     Coagulation Studies: No results for input(s): "LABPROT", "INR" in the last 72 hours.  Urinalysis: No results for input(s): "COLORURINE", "LABSPEC", "PHURINE", "GLUCOSEU", "HGBUR", "BILIRUBINUR", "KETONESUR", "PROTEINUR", "UROBILINOGEN", "NITRITE", "LEUKOCYTESUR" in the last 72 hours.  Invalid input(s): "APPERANCEUR"     Imaging: No results found.   Medications:      amiodarone  200 mg Oral BID   Followed by   Derrill Memo ON 09/27/2022] amiodarone  200 mg Oral Daily   apixaban  2.5 mg Oral BID   vitamin C  500 mg Oral BID   atorvastatin  10 mg Oral Daily   Chlorhexidine Gluconate Cloth  6 each Topical Q0600   diltiazem  60 mg Oral Q12H   docusate sodium  100 mg Oral BID   ezetimibe  10 mg Oral Daily   feeding supplement  237 mL Oral TID BM   furosemide  40 mg Oral Daily   insulin aspart  0-9 Units Subcutaneous Q4H   multivitamin with minerals  1 tablet Oral Daily   mycophenolate  500 mg Oral BID   mouth rinse  15 mL Mouth Rinse 4 times per day   potassium chloride SA  20 mEq Oral Daily   [START ON 09/15/2022] pregabalin  50 mg Oral Daily   tacrolimus  2 mg Oral BID   zinc sulfate  220 mg Oral Daily   acetaminophen **OR** acetaminophen, iohexol, labetalol, magnesium hydroxide, morphine injection, nitroGLYCERIN, ondansetron **OR** ondansetron (ZOFRAN) IV, mouth rinse, traZODone  Assessment/ Plan:  Jean Johnson is a 72 y.o.  female  with past medical conditions including diabetes, atrial fibrillation on Eliquis, dyslipidemia, hypertension, end-stage renal disease status post renal transplant x 2, diabetic neuropathy, and PUD.  Patient presents to the emergency department with complaints of shortness of breath and chest pain.  Patient has been admitted for Acute on chronic diastolic heart failure (HCC) [I50.33] Acute on chronic diastolic (congestive) heart failure (HCC) [I50.33] Dyspnea, unspecified type [R06.00]   Acute Kidney Injury on  chronic kidney disease stage IIIAt with baseline creatinine 1.27 and GFR of 45 on 08/18/22.  Acute kidney injury secondary to cardiorenal syndrome Chronic kidney disease is secondary to diabetes, hypertension and CHF. No exposure to IV contrast. Furosemide drip started in ED.   Creatinine remained stable today.  Urine output 950 mL recorded in previous 24 hours.  Patient transition to oral diuresis yesterday.  Will decrease Tacrolimus to '2mg'$  BID.  Lab Results  Component Value Date   CREATININE 2.75 (H) 09/14/2022   CREATININE 2.75 (H) 09/13/2022   CREATININE 2.44 (H) 09/12/2022    Intake/Output Summary (Last 24 hours) at 09/14/2022 1217 Last data filed at 09/14/2022 1048 Gross per 24 hour  Intake 600 ml  Output 950 ml  Net -350 ml  2. Anemia of chronic kidney disease  Lab Results  Component Value Date   HGB 9.2 (L) 09/14/2022    Hemoglobin remains stable  3. Secondary Hyperparathyroidism: with outpatient labs: PTH 141, phosphorus 3.2, calcium 9.7 on 08/18/22.   Lab Results  Component Value Date   CALCIUM 8.2 (L) 09/14/2022   CAION 1.25 08/30/2022   PHOS 3.7 09/08/2022    Will continue to monitor bone minerals during this admission. 4. Diabetes mellitus type II with chronic kidney disease/renal manifestations: insulin/noninsulin dependent. Home regimen includes Humalog and levemir. Most recent hemoglobin A1c is 5.7 on 04/14/22.   Primary team to manage sliding scale insulin.  5. Acute on Chronic diastolic heart failure. ECHO on 09/08/22 shows EF 60-65% with a grade 1 diastolic dysfunction, mild AVR with stenosis.   Continue oral Furosemide '40mg'$  daily.     LOS: 8   3/6/202412:17 PM

## 2022-09-14 NOTE — Progress Notes (Signed)
PROGRESS NOTE    Jean Johnson   [id number] DOB: [birth year]  DOA: 09/06/2022 Date of Service: 09/14/22 PCP: Gladstone Lighter, MD     Brief Narrative / Hospital Course:  72 year old African-American female with past medical history significant for ESRD s/p renal transplant x 2 now having CKD stage IIIb, hypertension, dyslipidemia, hypothyroidism, PAF on anticoagulation with Eliquis, PUD, type 2 diabetes mellitus with diabetic neuropathy as well as other comorbidities who presented to the ED with acute worsening of her dyspnea. She has had diminished urine output without dysuria urinary frequency or flank pain.  02/27: BP was 172/91 and heart rate 103. EKG Aflutter w/ variable AV block. Chest x-ray showed cardiomegaly with central pulmonary vascular congestion and small bilateral pleural effusions and patchy opacities in the lung bases that may reflect edema or infection. BUN 38 and creatinine 1.33. Was given 4 baby aspirin, 0.4 mg sublingual nitroglycerin and 20 mg of IV Lasix. VQ low probability PE 02/28: cardiology saw patient - continue IV lasix bid, echo done  02/29: renal fxn worsening, lasix to continuous infusion, d/c lisinopril  03/01: Nephrology consulted, lasix drip increased. Echo with moderate LVH, mild AS, and mild-moderate TR, overall similar to prior from 2023    03/02-03/04: Remain on lasix gtt.  03/04: transition from lasix gtt to po  03/05: remained stable  03/06: Cardiology s/o. Pt not qualifying for rehab, Palliative care [place] discussion. Tacrolimus decreased    Consultants:  Cardiology Palliative Care  Nephrology   Procedures: none      ASSESSMENT & PLAN:   Principal Problem:   Acute on chronic diastolic (congestive) heart failure (HCC) Active Problems:   Hypertensive urgency   Chest pain   Elevated d-dimer   Renal transplant recipient   Dyslipidemia   Paroxysmal atrial fibrillation ([place])   Type 2 diabetes mellitus with chronic kidney  disease, with long-term current use of insulin (HCC)   Diabetic neuropathy (HCC)   Acute on chronic diastolic heart failure (HCC)   Protein-calorie malnutrition, severe   Acute on chronic diastolic congestive heart failure - stable/baseline  Hypertensive urgency - resolved  Lasix drip to oral Lasix on 09/12/2022 ACE inhibitor on hold at this time in the setting of increasing creatinine, other GDMT limited by renal function  Strict I's and O's, low-salt diet, daily weights Cardiology s/o today    Paroxysmal atrial fibrillation with rapid ventricular response, now rate controlled Eliquis 2.5 mg twice daily Cardizem 120 mg twice daily Given worsening renal function flecainide has been switched to amiodarone '200mg'$  BID x 14 days, then '200mg'$  daily thereafter    ESRD s/p renal transplant x 2 Chronic immunosuppression Acute on chronic renal failure likely multifactorial in the setting of diuresis as well as hepatorenal syndrome Still makes urine.  Followed by nephrology outpatient. Baseline creatinine 1.29-1.33 Continue immunosuppressive regimen of CellCept and tacrolimus - tacrolimus decreased today  Nephrology on board we appreciate recommendations   Elevated troponin likely demand ischemia Dyslipidemia Borderline elevation.  Flat trend Not indicative of ACS Continue continue aspirin and statin therapy   Diabetic neuropathy (HCC) Continue with Pregabalin 50 mg p.o. twice daily --> reduced d/t renal function    Type 2 diabetes mellitus with chronic kidney disease, with long-term current use of insulin (HCC) continue insulin therapy Continue to Monitor CBG's per Protocol        DVT prophylaxis: eliquis Pertinent IV fluids/nutrition: no continuous IV fluids, poor po intake  Central lines / invasive devices: none  Code Status: FULL CODE  Current Admission Status: inpatient   TOC needs / Dispo plan: likely home w/ HH/DME, family considering long term care but pt is medicare they  weill have to arrange w/o TOC  Barriers to discharge / significant pending items: secure DME and home health, if stable anticipate d/c tomorrow              Subjective / Brief ROS:  Patient reports no concerns Denies CP/SOB.  Pain controlled.  Denies new weakness.  Tolerating diet.    Family Communication: family at bedside on rounds     Objective Findings:  Vitals:   09/14/22 0122 09/14/22 0552 09/14/22 1200 09/14/22 1626  BP: (!) 126/51 (!) 125/58 119/79 128/69  Pulse: 68 72 88 83  Resp: '20 20 16 16  '$ Temp: 98.7 F (37.1 C) 98.3 F (36.8 C) 98.6 F (37 C) 97.9 F (36.6 C)  TempSrc:  Oral    SpO2: 90% 100% 100% 100%  Weight:      Height:        Intake/Output Summary (Last 24 hours) at 09/14/2022 1656 Last data filed at 09/14/2022 1048 Gross per 24 hour  Intake 600 ml  Output 600 ml  Net 0 ml   Filed Weights   09/09/22 1347 09/10/22 0600 09/11/22 0334  Weight: 50.9 kg 52.7 kg 50.4 kg    Examination:  Physical Exam Constitutional:      General: She is not in acute distress.    Appearance: She is not toxic-appearing.  Cardiovascular:     Rate and Rhythm: Normal rate. Rhythm irregular.  Pulmonary:     Effort: Pulmonary effort is normal.     Breath sounds: Examination of the right-lower field reveals decreased breath sounds. Examination of the left-lower field reveals decreased breath sounds. Decreased breath sounds present.  Musculoskeletal:     Right lower leg: No edema.     Left lower leg: No edema.  Skin:    General: Skin is warm and dry.  Neurological:     Mental Status: She is alert and oriented to person, place, and time.  Psychiatric:        Mood and Affect: Mood normal.        Behavior: Behavior normal.          Scheduled Medications:   amiodarone  200 mg Oral BID   Followed by   Derrill Memo ON 09/27/2022] amiodarone  200 mg Oral Daily   apixaban  2.5 mg Oral BID   vitamin C  500 mg Oral BID   atorvastatin  10 mg Oral Daily    Chlorhexidine Gluconate Cloth  6 each Topical Q0600   diltiazem  60 mg Oral Q12H   docusate sodium  100 mg Oral BID   ezetimibe  10 mg Oral Daily   feeding supplement  237 mL Oral TID BM   furosemide  40 mg Oral Daily   insulin aspart  0-9 Units Subcutaneous Q4H   multivitamin with minerals  1 tablet Oral Daily   mycophenolate  500 mg Oral BID   mouth rinse  15 mL Mouth Rinse 4 times per day   potassium chloride SA  20 mEq Oral Daily   [START ON 09/15/2022] pregabalin  50 mg Oral Daily   tacrolimus  2 mg Oral BID   zinc sulfate  220 mg Oral Daily    Continuous Infusions:   PRN Medications:  acetaminophen **OR** acetaminophen, iohexol, labetalol, magnesium hydroxide, morphine injection, nitroGLYCERIN, ondansetron **OR** ondansetron (ZOFRAN) IV, mouth rinse, traZODone  Antimicrobials from admission:  Anti-infectives (From admission, onward)    None           Data Reviewed:  I have personally reviewed the following...  CBC: Recent Labs  Lab 09/10/22 0533 09/11/22 0507 09/12/22 0628 09/13/22 0653 09/14/22 0340  WBC 8.6 8.7 9.5 8.3 7.3  NEUTROABS 6.5 6.4 6.9 6.1 5.4  HGB 10.1* 10.1* 9.9* 10.0* 9.2*  HCT 33.0* 32.3* 31.7* 32.4* 28.8*  MCV 98.5 98.2 97.8 98.2 95.4  PLT 101* 110* 120* 125* [id number]*   Basic Metabolic Panel: Recent Labs  Lab 09/08/22 0541 09/09/22 0556 09/10/22 0533 09/11/22 0507 09/12/22 0628 09/13/22 0653 09/14/22 0340  NA 142 141 138 140 141 139 142  K 3.8 3.7 3.8 3.7 4.2 4.0 4.0  CL 109 109 109 108 107 108 109  CO2 24 23 21* '25 24 25 24  '$ GLUCOSE 92 86 151* 88 97 88 83  BUN 41* 44* 44* 43* 44* 50* 52*  CREATININE 1.52* 1.83* 2.05* 2.24* 2.44* 2.75* 2.75*  CALCIUM 8.2* 7.8* 8.0* 8.0* 8.0* 8.2* 8.2*  MG 1.8 1.9  --   --   --   --   --   PHOS 3.7  --   --   --   --   --   --    GFR: Estimated Creatinine Clearance: 14.4 mL/min (A) (by C-G formula based on SCr of 2.75 mg/dL (H)). Liver Function Tests: Recent Labs  Lab 09/08/22 0541  AST  14*  ALT 7  ALKPHOS 57  BILITOT 0.7  PROT 4.3*  ALBUMIN 2.0*   No results for input(s): "LIPASE", "AMYLASE" in the last 168 hours. No results for input(s): "AMMONIA" in the last 168 hours. Coagulation Profile: No results for input(s): "INR", "PROTIME" in the last 168 hours. Cardiac Enzymes: No results for input(s): "CKTOTAL", "CKMB", "CKMBINDEX", "TROPONINI" in the last 168 hours. BNP (last 3 results) No results for input(s): "PROBNP" in the last 8760 hours. HbA1C: No results for input(s): "HGBA1C" in the last 72 hours. CBG: Recent Labs  Lab 09/14/22 0117 09/14/22 0531 09/14/22 0827 09/14/22 1203 09/14/22 1627  GLUCAP 80 85 78 137* 110*   Lipid Profile: No results for input(s): "CHOL", "HDL", "LDLCALC", "TRIG", "CHOLHDL", "LDLDIRECT" in the last 72 hours. Thyroid Function Tests: No results for input(s): "TSH", "T4TOTAL", "FREET4", "T3FREE", "THYROIDAB" in the last 72 hours. Anemia Panel: No results for input(s): "VITAMINB12", "FOLATE", "FERRITIN", "TIBC", "IRON", "RETICCTPCT" in the last 72 hours. Most Recent Urinalysis On File:     Component Value Date/Time   COLORURINE STRAW (A) 09/06/2022 2353   APPEARANCEUR CLEAR (A) 09/06/2022 2353   APPEARANCEUR Hazy (A) 11/16/2020 1344   LABSPEC 1.007 09/06/2022 2353   PHURINE 5.0 09/06/2022 2353   GLUCOSEU NEGATIVE 09/06/2022 2353   HGBUR MODERATE (A) 09/06/2022 2353   [place] NEGATIVE 09/06/2022 2353   BILIRUBINUR Negative 11/16/2020 1344   [place] 09/06/2022 2353   PROTEINUR >=300 (A) 09/06/2022 2353   NITRITE NEGATIVE 09/06/2022 2353   LEUKOCYTESUR NEGATIVE 09/06/2022 2353   Sepsis Labs: '@LABRCNTIP'$ (procalcitonin:4,lacticidven:4) Microbiology: No results found for this or any previous visit (from the past 240 hour(s)).    Radiology Studies last 3 days: No results found.           LOS: 8 days       Emeterio Reeve, DO [facility] 09/14/2022, 4:56 PM    Dictation software may  have been used to generate the above note. Typos may occur and escape review in typed/dictated notes. Please  contact Dr Sheppard Coil directly for clarity if needed.  Staff may message me via secure chat in [place]  but this may not receive an immediate response,  please page me for urgent matters!  If 7PM-7AM, please contact night coverage [email or web address]

## 2022-09-14 NOTE — Progress Notes (Signed)
Speech Language Pathology Treatment: Dysphagia  Patient Details Name: Nevayah Fersch MRN: [id number] DOB: Nov 03, 1950 Today's Date: 09/14/2022 Time: 1015-1050 SLP Time Calculation (min) (ACUTE ONLY): 35 min  Assessment / Plan / Recommendation Clinical Impression  Pt seen for f/u w/ toleration of diet per NSG request to downgrade diet yesterday in setting of oral holding/pocketing noted w/ foods and liquids ongoing. Pt had recently been recommended a mech soft consistency diet w/ thins by ST services post BSE this admit. Pt lying leaning to Left side in bed -- needed full positioning support; c/o discomfort but appeared situational. Pt verbal but engaged intermittently; paucity of speech. She helped to feed self sips of thin liquids via Straw by holding Cup given support, cues.  On RA; afebrile. WBC wnl.    Pt appears to present w/ grossly functional oropharyngeal phase swallowing w/ trials accepted w/ No pharyngeal phase dysphagia noted AND no oral phase dysphagia noted; No overt neuromuscular weaknesses noted. Pt consumed po trials w/ No immediate, overt clinical s/s of aspiration during po trials. Pt appears at reduced risk for aspiration following general aspiration precautions, support during meals w/ feeding, and slightly modified foods for conservation of energy(mech soft foods).   However, pt does have challenging factors that could impact her oropharyngeal swallowing to include discomfort from Wounds on R side(NSG aware), fatigue/weakness, weaker UE movements impacting self-feeding abilities, severely Deconditioned State overall, more of a chair/bed-bound status per Husband, and suspected impact of mild Cognitive decline possibly -- see last MRI results. ANY Cognitive decline can impact overall desire and attention for oral intake as well as awareness/timing of swallow and safety during po tasks which increases risk for aspiration, choking as well as decreased oral intake overall.    During  po trials, pt consumed thin liquids w/ no overt coughing, decline in vocal quality, or change in respiratory presentation during/post trials. Oral phase appeared [facility] w/ timely bolus management and oral clearing; control of bolus propulsion for A-P transfer for swallowing was timely. Oral clearing achieved w/ all trials.  Pt could Hold Cup to feed self her drink but appears to need support w/ eating of foods -- unsure of WHY so deconditioned and debiliated to not be able to self-feed here and at home.   In setting of oral holding/pocketing noted w/ increased textured foods by NSG/family, recommend a more pureed consistency diet w/ gravies to moistened foods; Thin liquids. Pt should hold Cup to drink liquids. ENcouragement to eat/drink at meals. Choose foods of preference. Recommend general aspiration precautions, support w/ feeding as needed. Pills CRUSHED in Puree for safer, easier swallowing -- this is Baseline for pt at home.    Education given on the above and rationale for it to Family member present and to pt; MD/NSG updated. All agreed. MD to discuss further w/ Palliative Care and pt/Family re: any upgrade of diet to meet pt's preferences if aligning w/ pt's overall GOC/comfort wishes/QOL choices. Recommend continue Pills Crushed in Puree; food consistencies/preferences and easy to eat options for conservation of energy; general aspiration precautions; and feeding support.   ST services will sign off at this time w/ MD to reconsult if any new needs arise. MD updated. Also recommended Dietician and Palliative f/u continue for support in setting of Chronic health decline.        HPI HPI: Pt is a 72 y.o. African-American female with medical history significant for end-stage renal disease s/p renal transplant x2, hypertension, dyslipidemia, hypothyroidism, paroxysmal atrial fibrillation on Eliquis,  PUD, type 2 diabetes mellitus with diabetic neuropathy, more w/c-bound at home per husband and requires  assistance w/ all ADLs who presented to the emergency room with acute onset of worsening dyspnea with associated chest pain which has been progressively worse over the last week.  She describes her chest pain as tightness and rates it 10/10 in severity with associated nausea without vomiting or diaphoresis and without radiation.  She has been having associated worsening lower extremity edema without pain as well as orthopnea.  She has been having diminished urine output without dysuria, urinary frequency, urgency or flank pain.  No cough or wheezing or hemoptysis.  CXR:  Increased volume of left pleural effusion.  2. Improved right pleural effusion.  3. Persistent mild interstitial edema.  MRI 08/2022: No acute intracranial abnormality.  2. Mild chronic small vessel ischemic disease, mildly progressed  from 2023.      SLP Plan  All goals met      Recommendations for follow up therapy are one component of a multi-disciplinary discharge planning process, led by the attending physician.  Recommendations may be updated based on patient status, additional functional criteria and insurance authorization.    Recommendations  Diet recommendations: Dysphagia 1 (puree);Thin liquid Liquids provided via: Cup;Straw (monitor) Medication Administration: Crushed with puree (baseline) Supervision: Patient able to self feed;Staff to assist with self feeding;Full supervision/cueing for compensatory strategies (pt can hold cup) Compensations: Minimize environmental distractions;Slow rate;Small sips/bites;Lingual sweep for clearance of pocketing;Follow solids with liquid Postural Changes and/or Swallow Maneuvers: Seated upright 90 degrees;Out of bed for meals;Upright 30-60 min after meal                General recommendations:  (Palliative Care consult; Dietician) Oral Care Recommendations: Oral care BID;Oral care before and after PO;Staff/trained caregiver to provide oral care Follow Up Recommendations: No SLP  follow up Assistance recommended at discharge: Frequent or constant Supervision/Assistance SLP Visit Diagnosis: Dysphagia, unspecified (R13.10) (impact from [place]?) Plan: All goals met             Orinda Kenner, [place], [place]; [place] [phone] (ascom) Alyza Artiaga  09/14/2022, 11:07 AM

## 2022-09-14 NOTE — Hospital Course (Addendum)
72 year old African-American female with past medical history significant for ESRD s/p renal transplant x 2 now having CKD stage IIIb, hypertension, dyslipidemia, hypothyroidism, PAF on anticoagulation with Eliquis, PUD, type 2 diabetes mellitus with diabetic neuropathy as well as other comorbidities who presented to the ED with acute worsening of her dyspnea. She has had diminished urine output without dysuria urinary frequency or flank pain.  02/27: BP was 172/91 and heart rate 103. EKG Aflutter w/ variable AV block. Chest x-ray showed cardiomegaly with central pulmonary vascular congestion and small bilateral pleural effusions and patchy opacities in the lung bases that may reflect edema or infection. BUN 38 and creatinine 1.33. Was given 4 baby aspirin, 0.4 mg sublingual nitroglycerin and 20 mg of IV Lasix. VQ low probability PE 02/28: cardiology saw patient - continue IV lasix bid, echo done  02/29: renal fxn worsening, lasix to continuous infusion, d/c lisinopril  03/01: Nephrology consulted, lasix drip increased. Echo with moderate LVH, mild AS, and mild-moderate TR, overall similar to prior from 2023    03/02-03/04: Remain on lasix gtt.  03/04: transition from lasix gtt to po  03/05: remained stable on po meds  03/06: Cardiology s/o. Palliative care [place] discussion. PT/OT to see.  03/07-03/10: PT/OT recs for SNF, TOC following, placement pending thru weekend.  03/11 Mon SNF still in process 03/12: elevated temp and tachycardia, Cr slightly up, UA concerning for UTI, CXR no apparent pneumonia but concern for increased pulmonary edema. Caution w/ diuresis, will start ceftriaxone and await UCx, will not discharge today.    Consultants:  Cardiology Palliative Care  Nephrology   Procedures: none      ASSESSMENT & PLAN:   Principal Problem:   Acute on chronic diastolic (congestive) heart failure (HCC) Active Problems:   Hypertensive urgency   Chest pain   Elevated d-dimer   Renal  transplant recipient   Dyslipidemia   Paroxysmal atrial fibrillation ([place])   Type 2 diabetes mellitus with chronic kidney disease, with long-term current use of insulin (HCC)   Diabetic neuropathy (HCC)   Acute on chronic diastolic heart failure (HCC)   Protein-calorie malnutrition, severe   Acute on chronic diastolic congestive heart failure - stable Hypertensive urgency - resolved  Pulmonary edema - worsening  Lasix drip to oral Lasix on 09/12/2022 and has remained euvolemic  ACE inhibitor on hold at this time in the setting of increasing creatinine, other GDMT limited by renal function  Strict I's and O's, low-salt diet, daily weights Cardiology s/o 03/06 Nephrology following.   Caution w/ diuresis given renal function    UTI Ceftriaxone started 09/20/22 Await cultures Follow fever curve, VS, CBC Avoiding IV fluids d/t pulmonary edema Monitor BMP  Paroxysmal atrial fibrillation with rapid ventricular response, now rate controlled Eliquis 2.5 mg twice daily Cardizem 120 mg twice daily Given worsening renal function flecainide was switched to amiodarone '200mg'$  BID x 14 days, then '200mg'$  daily thereafter    ESRD s/p renal transplant x 2 Chronic immunosuppression Acute on chronic renal failure likely multifactorial in the setting of diuresis as well as hepatorenal syndrome Still makes urine.  Followed by nephrology outpatient. Baseline creatinine 1.29-1.33 Continue immunosuppressive regimen of CellCept and tacrolimus  Nephrology on board - appreciate recommendations   Elevated troponin likely demand ischemia Dyslipidemia Borderline elevation.  Flat trend Not indicative of ACS Continue continue aspirin and statin therapy   Diabetic neuropathy (HCC) Continue with Pregabalin 50 mg p.o. twice daily --> reduced d/t renal function    Type 2 diabetes mellitus  with chronic kidney disease, with long-term current use of insulin (HCC) continue insulin therapy Continue to Monitor  CBG's per Protocol        DVT prophylaxis: eliquis Pertinent IV fluids/nutrition: no continuous IV fluids, poor po intake but improving  Central lines / invasive devices: none  Code Status: FULL CODE   Current Admission Status: inpatient   TOC needs / Dispo plan: SNF rehab Barriers to discharge / significant pending items: await UCx

## 2022-09-14 NOTE — Progress Notes (Signed)
Daily Progress Note   Patient Name: Jean Johnson       Date: 09/14/2022 DOB: 28-Sep-1950  Age: 72 y.o. MRN#: [id number] Attending Physician: Emeterio Reeve, DO Primary Care Physician: Gladstone Lighter, MD Admit Date: 09/06/2022  Reason for Consultation/Follow-up: Establishing goals of care  Subjective: Notes and labs reviewed. In to see patient. SIL Sylvia at bedside. They state they would like to complete an AD packet. AD packet provided and soon after, husband entered in to bedside.  We discussed her diagnosis, prognosis, GOC, EOL wishes disposition and options.  Created space and opportunity for patient  to explore thoughts and feelings regarding current medical information.   A detailed discussion was had today regarding advanced directives.  Concepts specific to code status, artifical feeding and hydration, IV antibiotics and rehospitalization were discussed.  The difference between an aggressive medical intervention path and a comfort care path was discussed.  Values and goals of care important to patient and family were attempted to be elicited.  Discussed limitations of medical interventions to prolong quality of life in some situations and discussed the concept of human mortality.   Patient states as she did yesterday that she would want any and all care as long as possible regardless of pain and suffering.  Discussed determining who the H POA's would be, and then having ongoing discussions regarding any limits to care.  Length of Stay: 8  Current Medications: Scheduled Meds:   amiodarone  200 mg Oral BID   Followed by   Derrill Memo ON 09/27/2022] amiodarone  200 mg Oral Daily   apixaban  2.5 mg Oral BID   vitamin C  500 mg Oral BID   atorvastatin  10 mg Oral Daily    Chlorhexidine Gluconate Cloth  6 each Topical Q0600   diltiazem  60 mg Oral Q12H   docusate sodium  100 mg Oral BID   ezetimibe  10 mg Oral Daily   feeding supplement  237 mL Oral TID BM   furosemide  40 mg Oral Daily   insulin aspart  0-9 Units Subcutaneous Q4H   multivitamin with minerals  1 tablet Oral Daily   mycophenolate  500 mg Oral BID   mouth rinse  15 mL Mouth Rinse 4 times per day   potassium chloride SA  20 mEq Oral Daily   [  START ON 09/15/2022] pregabalin  50 mg Oral Daily   tacrolimus  2 mg Oral BID   zinc sulfate  220 mg Oral Daily    Continuous Infusions:   PRN Meds: acetaminophen **OR** acetaminophen, iohexol, labetalol, magnesium hydroxide, morphine injection, nitroGLYCERIN, ondansetron **OR** ondansetron (ZOFRAN) IV, mouth rinse, traZODone  Physical Exam Pulmonary:     Effort: Pulmonary effort is normal.  Neurological:     Mental Status: She is alert.             Vital Signs: BP 119/79 (BP Location: Right Arm)   Pulse 88   Temp 98.6 F (37 C)   Resp 16   Ht '5\' 2"'$  (1.575 m)   Wt 50.4 kg   SpO2 100%   BMI 20.32 kg/m  SpO2: SpO2: 100 % O2 Device: O2 Device: Room Air O2 Flow Rate:    Intake/output summary:  Intake/Output Summary (Last 24 hours) at 09/14/2022 1251 Last data filed at 09/14/2022 1048 Gross per 24 hour  Intake 600 ml  Output 950 ml  Net -350 ml   LBM: Last BM Date : 09/13/22 Baseline Weight: Weight: 46.7 kg Most recent weight: Weight: 50.4 kg        Patient Active Problem List   Diagnosis Date Noted   Protein-calorie malnutrition, severe 09/13/2022   Acute on chronic diastolic (congestive) heart failure ([place]) 09/06/2022   Chest pain 09/06/2022   Elevated d-dimer 09/06/2022   Paroxysmal atrial fibrillation ([place]) 09/06/2022   Type 2 diabetes mellitus with chronic kidney disease, with long-term current use of insulin ([place]) 09/06/2022   Hypertensive urgency 09/06/2022   Diabetic neuropathy ([place]) 09/06/2022   Acute on chronic  diastolic heart failure ([place]) 09/06/2022   Pain and swelling of left lower leg 08/28/2022   Visual changes 08/24/2022   Headache 08/24/2022   Pleural effusion due to CHF (congestive heart failure) ([place]) 04/30/2022   Acute heart failure with preserved ejection fraction (HFpEF) ([place]) 04/30/2022   Pressure injury of skin 04/27/2022   AKI (acute kidney injury) ([place]) on CKD 3b/4 04/27/2022   Intractable nausea and vomiting 04/25/2022   Atrial fibrillation with rapid ventricular response ([place]) 04/25/2022   Type 2 diabetes mellitus with peripheral neuropathy ([place]) 04/25/2022   Renal transplant recipient 04/25/2022   Dyslipidemia 04/25/2022   Generalized weakness    Acute on chronic diastolic CHF (congestive heart failure) ([place]) 99991111   Metabolic acidosis 99991111   Acute urinary retention 04/18/2022   Syncope 04/14/2022   Atrial fibrillation, chronic ([place]) 04/14/2022   Type II diabetes mellitus with renal manifestations ([place]) 04/14/2022   HLD (hyperlipidemia)    Hypokalemia    Acute bronchitis due to COVID-19 virus    Acute renal failure superimposed on stage 3a chronic kidney disease ([place])    Myocardial injury    Hypoglycemia    Cervical myelopathy ([place]) 03/24/2021   Anemia 12/03/2020   Essential hypertension 12/03/2020   Hypothyroidism 12/03/2020   Type 2 diabetes mellitus ([place]) 12/03/2020   Atrial fibrillation ([place]) 09/21/2018   Kidney transplant status 09/21/2018   Osteoporosis 09/21/2018    Palliative Care Assessment & Plan   Recommendations/Plan: Full code and full scope   Code Status:    Code Status Orders  (From admission, onward)           Start     Ordered   09/06/22 2029  Full code  Continuous       Question:  By:  Answer:  Consent: discussion documented in EHR   09/06/22  2042           Code Status History     Date Active Date Inactive Code Status Order ID Comments User Context   04/25/2022 2224 05/02/2022 2227 Full Code [id number]  Sidney Ace  Arvella Merles, MD ED   04/14/2022 0805 04/19/2022 1950 Full Code [id number]  Ivor Costa, MD ED   03/24/2021 1513 03/27/2021 2131 Full Code [id number]  Loleta Dicker, PA Inpatient       Prognosis: Poor overall    Thank you for allowing the Palliative Medicine Team to assist in the care of this patient.   Asencion Gowda, NP  Please contact Palliative Medicine Team phone at [phone] for questions and concerns.

## 2022-09-14 NOTE — Progress Notes (Signed)
Physical Therapy Treatment Patient Details Name: Jean Johnson MRN: [id number] DOB: September 18, 1950 Today's Date: 09/14/2022   History of Present Illness Jean Johnson is a 72 y.o. African-American female with medical history significant for end-stage renal disease s/p renal transplant x2, hypertension, dyslipidemia, hypothyroidism, paroxysmal atrial fibrillation on Eliquis, PUD, type 2 diabetes mellitus with diabetic neuropathy, who presented to the emergency room with acute onset of worsening dyspnea with associated chest pain which has been progressively worse over the last week.    PT Comments    Patient received in bed, reports her left leg is hurting. She would like to sit up to have a sit of water. Patient required total assist to get from supine to sitting with pain in B LEs during mobility. Patient able to sit edge of bed with assistance once cued to use UEs to help prop her in sitting. Patient has poor sitting tolerance due to very weak trunk muscles. She sits with flexed posture and forward head. Patient tolerated a few minutes of sitting and then requested to lie back down due to fatigue. Patient is not able to progress with mobility this session. She will continue to benefit from skilled PT to attempt to make progress with strength and mobility.      Recommendations for follow up therapy are one component of a multi-disciplinary discharge planning process, led by the attending physician.  Recommendations may be updated based on patient status, additional functional criteria and insurance authorization.  Follow Up Recommendations  Long-term institutional care without follow-up therapy Can patient physically be transported by private vehicle: No   Assistance Recommended at Discharge Frequent or constant Supervision/Assistance  Patient can return home with the following A lot of help with walking and/or transfers;A lot of help with bathing/dressing/bathroom;Assistance with  cooking/housework;Assistance with feeding;Direct supervision/assist for medications management;Direct supervision/assist for financial management;Assist for transportation;Help with stairs or ramp for entrance   Equipment Recommendations  None recommended by PT (TBD next venue)    Recommendations for Other Services       Precautions / Restrictions Precautions Precautions: Fall Precaution Comments: Sacral wound; aspiration; L UE AV fistula Restrictions Weight Bearing Restrictions: No     Mobility  Bed Mobility Overal bed mobility: Needs Assistance Bed Mobility: Supine to Sit, Sit to Supine     Supine to sit: Total assist Sit to supine: Total assist   General bed mobility comments: Assist for B LEs and trunk to raise up to sitting position. Assist to get scooted out to edge of bed. With cues patient is able to use B UEs to prop to sit more independently at edge of bed.    Transfers                   General transfer comment: unable due to poor sitting balance/tolerance    Ambulation/Gait                   Stairs             Wheelchair Mobility    Modified Rankin (Stroke Patients Only)       Balance Overall balance assessment: Needs assistance Sitting-balance support: Feet unsupported, Bilateral upper extremity supported Sitting balance-Leahy Scale: Poor Sitting balance - Comments: mod assist d/t L lateral posterior lean. Poor trunk strength. Sits with very flexed posture, head down. Poor sitting tolerance. Postural control: Posterior lean  Cognition Arousal/Alertness: Lethargic, Awake/alert Behavior During Therapy: Flat affect Overall Cognitive Status: Within Functional Limits for tasks assessed Area of Impairment: Problem solving                             Problem Solving: Slow processing          Exercises Other Exercises Other Exercises: PROM to B LEs as they are very  painful with movement.    General Comments        Pertinent Vitals/Pain Pain Assessment Pain Assessment: Faces Faces Pain Scale: Hurts even more Pain Location: B LEs with movement Pain Descriptors / Indicators: Aching, Discomfort, Grimacing, Guarding, Moaning Pain Intervention(s): Monitored during session, Repositioned    Home Living                          Prior Function            PT Goals (current goals can now be found in the care plan section) Acute Rehab PT Goals Patient Stated Goal: family states husband is unable to provide level of care patient needs PT Goal Formulation: With family Time For Goal Achievement: 09/22/22 Progress towards PT goals: Not progressing toward goals - comment (continues to require max+2/ total A for mobility)    Frequency    Min 2X/week      PT Plan Discharge plan needs to be updated    Co-evaluation              AM-PAC PT "6 Clicks" Mobility   Outcome Measure  Help needed turning from your back to your side while in a flat bed without using bedrails?: Total Help needed moving from lying on your back to sitting on the side of a flat bed without using bedrails?: Total Help needed moving to and from a bed to a chair (including a wheelchair)?: Total Help needed standing up from a chair using your arms (e.g., wheelchair or bedside chair)?: Total Help needed to walk in hospital room?: Total Help needed climbing 3-5 steps with a railing? : Total 6 Click Score: 6    End of Session   Activity Tolerance: Patient limited by fatigue;Patient limited by pain Patient left: in bed;with call bell/phone within reach Nurse Communication: Mobility status PT Visit Diagnosis: Other abnormalities of gait and mobility (R26.89);Muscle weakness (generalized) (M62.81)     Time: [id number] PT Time Calculation (min) (ACUTE ONLY): 11 min  Charges:  $Therapeutic Activity: 8-22 mins                     Guinevere Stephenson, PT,  [facility] 09/14/22,2:33 PM

## 2022-09-14 NOTE — Progress Notes (Signed)
[facility] NOTE       Patient ID: Jean Johnson MRN: [id number] DOB/AGE: 74-Mar-1952 74 y.o.  Admit date: 09/06/2022 Referring Physician Dr, Eugenie Norrie Primary Physician Dr. Tressia Miners Primary Cardiologist Dr. Clayborn Bigness Reason for Consultation AoCHF  HPI: Jean Johnson. Jean Johnson is a 82yoF with a PMH of paroxysmal AF (flecainide, Eliquis), HFpEF (60-65%, moderate LVH, G1 DD, mild AS, mild-moderate TR 08/2022)  ESRD s/p renal transplants x2  (initially in 2000 but failed, second in 2008), DM2 who presented to [facility] ED 09/06/2022 with chest tightness, peripheral edema, and orthopnea.  Cardiology is consulted for assistance with her heart failure.  Interval History:  - seen this AM with sister in law at bedside, OT present and assisting with pt to maintain upright posture - main complaint is pain on her bottom from a sacral ulcer. No chest pain, heart racing, or shortness of breath - in AF tele, rate controlled in the 60s-70s  Review of systems complete and found to be negative unless listed above     Past Medical History:  Diagnosis Date   Anemia of chronic renal disease    Aortic stenosis, mild    a.) TTE on 11/23/2020 --> mean gradient 9.7 mmHg   Atrial fibrillation ([place])    a.) CHA2DS2VASc = 4 (age, sex, HTN, T2DM);  b.) rate/rhythm maintained on oral diltiazem + flecainide; chronically anticoagulated with apixaban   B12 deficiency    Cervical spinal stenosis    Diabetic neuropathy (HCC)    Diverticulosis    Dyspnea    ESRD (end stage renal disease) (HCC)    First degree AV block    Gastrointestinal ulcer    HLD (hyperlipidemia)    Hypertension    Hypothyroidism    Incomplete right bundle branch block (RBBB)    LAE (left atrial enlargement)    a.) TTE on 11/23/2020 --> moderate   Left thyroid nodule    a.) cervical MRI on 12/29/2020 --> measured "at least" 3.5 cm; incompletely imaged.   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   Valvular regurgitation    a.) TTE 11/23/2020 --> mild panvalvular regurgitation; b.) TTE 04/29/2022: mild MR    Past Surgical History:  Procedure Laterality Date   A/V FISTULAGRAM Left    ANTERIOR CERVICAL DECOMP/DISCECTOMY FUSION N/A 03/24/2021   Procedure: C3-6 ANTERIOR CERVICAL DECOMPRESSION/DISCECTOMY FUSION 3 LEVELS;  Surgeon: Meade Maw, MD;  Location: [facility] ORS;  Service: Neurosurgery;  Laterality: N/A;   ARTERY BIOPSY Right 08/30/2022   Procedure: BIOPSY TEMPORAL ARTERY;  Surgeon: Katha Cabal, MD;  Location: [facility] ORS;  Service: Vascular;  Laterality: Right;   CARPAL TUNNEL RELEASE Right 2016   CATARACT EXTRACTION W/ INTRAOCULAR LENS IMPLANT Right 03/2020   CATARACT EXTRACTION W/PHACO Left 10/21/2020   Procedure: CATARACT EXTRACTION PHACO AND INTRAOCULAR LENS PLACEMENT ([place]) DIABETES LEFT;  Surgeon: Leandrew Koyanagi, MD;  Location: [facility];  Service: Ophthalmology;  Laterality: Left;  2.84 0:48.8  5.8%   COLONOSCOPY WITH PROPOFOL N/A 09/03/2021   Procedure: COLONOSCOPY WITH PROPOFOL;  Surgeon: Lesly Rubenstein, MD;  Location: [facility] ENDOSCOPY;  Service: Endoscopy;  Laterality: N/A;  DM, ELIQUIS, WHEELCHAIR   EYE SURGERY Right 2020   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   Living donor organ recipient (sister)   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   Cadaveric organ recipient    Medications Prior to Admission  Medication Sig Dispense Refill Last Dose    alendronate (FOSAMAX) 70 MG tablet Take 70 mg by mouth once a week. monday   09/05/2022   apixaban (ELIQUIS) 5 MG TABS tablet Take 5 mg by mouth 2 (two) times daily.   09/06/2022 at 0830   atorvastatin (LIPITOR) 10 MG tablet Take 10 mg by mouth daily.   09/05/2022   diltiazem (CARDIZEM CD) 240 MG 24 hr capsule Take 240 mg by mouth at bedtime.   09/05/2022 at 2200   ezetimibe (ZETIA) 10 MG tablet Take 10 mg by mouth daily.   09/05/2022   flecainide (TAMBOCOR) 100 MG tablet Take 100 mg by mouth every 12 (twelve) hours.   09/05/2022   furosemide (LASIX) 20 MG tablet Take 20 mg by mouth 2 (two) times daily as needed.   09/05/2022   insulin detemir (LEVEMIR) 100 UNIT/ML injection Inject 8 Units into the skin at bedtime.   09/05/2022   insulin lispro (HUMALOG) 100 UNIT/ML injection Inject 5 Units into the skin 2 (two) times daily.   09/05/2022   lisinopril (ZESTRIL) 30 MG tablet Take 30 mg by mouth daily.   09/05/2022   mycophenolate (CELLCEPT) 500 MG tablet Take 500 mg by mouth 2 (two) times daily.   09/05/2022   Potassium Chloride ER 20 MEQ TBCR Take 1 tablet by mouth daily as needed.   Past Month   predniSONE (DELTASONE) 20 MG tablet Take 20 mg by mouth 3 (three) times daily.   09/05/2022   pregabalin (LYRICA) 50 MG capsule Take 1 capsule (50 mg total) by mouth 2 (two) times daily. 60 capsule 0 09/05/2022   tacrolimus (PROGRAF) 1 MG capsule Take 3 mg by mouth 2 (two) times daily.   09/05/2022   traZODone (DESYREL) 50 MG tablet Take 0.5 tablets (25 mg total) by mouth at bedtime as needed for sleep. 15 tablet 0 unknown    Social History   Socioeconomic History   Marital status: Married    Spouse name: Francee Piccolo   Number of children: Not on file   Years of education: Not on file   Highest education level: Not on file  Occupational History   Not on file  Tobacco Use   Smoking status: Never   Smokeless tobacco: Never  Vaping Use   Vaping Use: Never used  Substance and Sexual Activity   Alcohol use: Not  Currently   Drug use: Never   Sexual activity: Not Currently  Other Topics Concern   Not on file  Social History Narrative   Not on file   Social Determinants of Health   Financial Resource Strain: Not on file  Food Insecurity: No Food Insecurity (09/07/2022)   Hunger Vital Sign    Worried About Running Out of Food in the Last Year: Never true    Ran Out of Food in the Last Year: Never true  Transportation Needs: No Transportation Needs (09/07/2022)   PRAPARE - Hydrologist (Medical): No    Lack of Transportation (Non-Medical): No  Physical Activity: Not on file  Stress:  Not on file  Social Connections: Not on file  Intimate Partner Violence: Not At Risk (09/07/2022)   Humiliation, Afraid, Rape, and Kick questionnaire    Fear of Current or Ex-Partner: No    Emotionally Abused: No    Physically Abused: No    Sexually Abused: No    Family History  Problem Relation Age of Onset   Diabetes Father    Heart disease Father       Intake/Output Summary (Last 24 hours) at 09/14/2022 0813 Last data filed at 09/14/2022 0600 Gross per 24 hour  Intake 360 ml  Output 950 ml  Net -590 ml     Vitals:   09/13/22 1223 09/13/22 2224 09/14/22 0122 09/14/22 0552  BP: (!) 105/55 117/68 (!) 126/51 (!) 125/58  Pulse: 91 79 68 72  Resp: 18 (!) '22 20 20  '$ Temp: 99.2 F (37.3 C) 98.5 F (36.9 C) 98.7 F (37.1 C) 98.3 F (36.8 C)  TempSrc:    Oral  SpO2: 94% 100% 90% 100%  Weight:      Height:        PHYSICAL EXAM General: elderly thin black female, in no acute distress. Laying on her left side in hospital bed, sister-in-law and OT at bedside. HEENT:  Normocephalic and atraumatic. Very soft voice Neck:  No JVD.  Lungs: Normal respiratory effort on room air. Decreased breath sounds with basilar crackles.  Heart: irregularly irregular with controlled rate . Normal S1 and S2, 3/6 systolic murmur heard best at the apex  Abdomen: Non-distended appearing.  Msk:  generalized weakness Extremities: cool to touch, No clubbing, cyanosis. 1+ bilateral LE edema.  Neuro: Alert and oriented X 3. Psych:  Answers questions appropriately. Limited historian.   Labs: Basic Metabolic Panel: Recent Labs    09/13/22 0653 09/14/22 0340  NA 139 142  K 4.0 4.0  CL 108 109  CO2 25 24  GLUCOSE 88 83  BUN 50* 52*  CREATININE 2.75* 2.75*  CALCIUM 8.2* 8.2*    Liver Function Tests: No results for input(s): "AST", "ALT", "ALKPHOS", "BILITOT", "PROT", "ALBUMIN" in the last 72 hours.  No results for input(s): "LIPASE", "AMYLASE" in the last 72 hours. CBC: Recent Labs    09/13/22 0653 09/14/22 0340  WBC 8.3 7.3  NEUTROABS 6.1 5.4  HGB 10.0* 9.2*  HCT 32.4* 28.8*  MCV 98.2 95.4  PLT 125* 135*    Cardiac Enzymes: No results for input(s): "CKTOTAL", "CKMB", "CKMBINDEX", "TROPONINIHS" in the last 72 hours.  BNP: No results for input(s): "BNP" in the last 72 hours.  D-Dimer: No results for input(s): "DDIMER" in the last 72 hours.  Hemoglobin A1C: No results for input(s): "HGBA1C" in the last 72 hours. Fasting Lipid Panel: No results for input(s): "CHOL", "HDL", "LDLCALC", "TRIG", "CHOLHDL", "LDLDIRECT" in the last 72 hours. Thyroid Function Tests: No results for input(s): "TSH", "T4TOTAL", "T3FREE", "THYROIDAB" in the last 72 hours.  Invalid input(s): "FREET3"  Anemia Panel: No results for input(s): "VITAMINB12", "FOLATE", "FERRITIN", "TIBC", "IRON", "RETICCTPCT" in the last 72 hours.    Radiology: ECHOCARDIOGRAM COMPLETE  Result Date: 09/09/2022    ECHOCARDIOGRAM REPORT   Patient Name:   Jean Johnson Date of Exam: 09/08/2022 Medical Rec #:  [id number]           Height:       62.0 in Accession #:    [id number]          Weight:       112.0 lb Date of Birth:  [birth year]           BSA:          1.494 m Patient Age:    31 years            BP:           139/67 mmHg Patient Gender: F                   HR:           68 bpm. Exam Location:  [facility]  Procedure: 2D Echo, Cardiac Doppler and Color Doppler Indications:     CHF-acute diastolic [id number]  History:         Patient has prior history of Echocardiogram examinations, most                  recent 04/29/2022. Risk Factors:Hypertension and Dyslipidemia.  Sonographer:     Sherrie Sport Referring Phys:  [id number] Elmer Jamyrah Saur Diagnosing Phys: Isaias Cowman MD IMPRESSIONS  1. Left ventricular ejection fraction, by estimation, is 60 to 65%. The left ventricle has normal function. The left ventricle has no regional wall motion abnormalities. There is moderate concentric left ventricular hypertrophy. Left ventricular diastolic parameters are consistent with Grade I diastolic dysfunction (impaired relaxation).  2. Right ventricular systolic function is normal. The right ventricular size is normal.  3. A small pericardial effusion is present. Moderate pleural effusion.  4. The mitral valve is normal in structure. No evidence of mitral valve regurgitation. No evidence of mitral stenosis.  5. Tricuspid valve regurgitation is mild to moderate.  6. The aortic valve is normal in structure. Aortic valve regurgitation is mild. Mild aortic valve stenosis.  7. The inferior vena cava is normal in size with greater than 50% respiratory variability, suggesting right atrial pressure of 3 mmHg. FINDINGS  Left Ventricle: Left ventricular ejection fraction, by estimation, is 60 to 65%. The left ventricle has normal function. The left ventricle has no regional wall motion abnormalities. The left ventricular internal cavity size was normal in size. There is  moderate concentric left ventricular hypertrophy. Left ventricular diastolic parameters are consistent with Grade I diastolic dysfunction (impaired relaxation). Right Ventricle: The right ventricular size is normal. No increase in right ventricular wall thickness. Right ventricular systolic function is normal. Left Atrium: Left atrial size was normal in size. Right Atrium:  Right atrial size was normal in size. Pericardium: A small pericardial effusion is present. Mitral Valve: The mitral valve is normal in structure. No evidence of mitral valve regurgitation. No evidence of mitral valve stenosis. Tricuspid Valve: The tricuspid valve is normal in structure. Tricuspid valve regurgitation is mild to moderate. No evidence of tricuspid stenosis. Aortic Valve: The aortic valve is normal in structure. Aortic valve regurgitation is mild. Aortic regurgitation PHT measures 508 msec. Mild aortic stenosis is present. Aortic valve mean gradient measures 9.8 mmHg. Aortic valve peak gradient measures 18.0  mmHg. Aortic valve area, by VTI measures 2.00 cm. Pulmonic Valve: The pulmonic valve was normal in structure. Pulmonic valve regurgitation is not visualized. No evidence of pulmonic stenosis. Aorta: The aortic root is normal in size and structure. Venous: The inferior vena cava is normal in size with greater than 50% respiratory variability, suggesting right atrial pressure of 3 mmHg. IAS/Shunts: No atrial level shunt detected by color flow Doppler. Additional Comments: There is a moderate pleural effusion.  LEFT VENTRICLE PLAX 2D LVIDd:         4.00 cm   Diastology LVIDs:  2.60 cm   LV e' medial:    7.07 cm/s LV PW:         1.80 cm   LV E/e' medial:  14.7 LV IVS:        1.50 cm   LV e' lateral:   9.36 cm/s LVOT diam:     2.00 cm   LV E/e' lateral: 11.1 LV SV:         71 LV SV Index:   48 LVOT Area:     3.14 cm  RIGHT VENTRICLE RV Basal diam:  2.70 cm RV Mid diam:    1.50 cm RV S prime:     11.60 cm/s TAPSE (M-mode): 1.7 cm LEFT ATRIUM             Index        RIGHT ATRIUM           Index LA diam:        4.60 cm 3.08 cm/m   RA Area:     16.40 cm LA Vol (A2C):   37.8 ml 25.30 ml/m  RA Volume:   42.20 ml  28.24 ml/m LA Vol (A4C):   70.1 ml 46.92 ml/m LA Biplane Vol: 55.7 ml 37.28 ml/m  AORTIC VALVE AV Area (Vmax):    1.66 cm AV Area (Vmean):   1.86 cm AV Area (VTI):     2.00 cm AV  Vmax:           212.00 cm/s AV Vmean:          142.750 cm/s AV VTI:            0.356 m AV Peak Grad:      18.0 mmHg AV Mean Grad:      9.8 mmHg LVOT Vmax:         112.00 cm/s LVOT Vmean:        84.500 cm/s LVOT VTI:          0.226 m LVOT/AV VTI ratio: 0.64 AI PHT:            508 msec  AORTA Ao Root diam: 2.30 cm MITRAL VALVE                TRICUSPID VALVE MV Area (PHT): 2.88 cm     TR Peak grad:   36.0 mmHg MV Decel Time: 263 msec     TR Vmax:        300.00 cm/s MV E velocity: 104.00 cm/s MV A velocity: 33.00 cm/s   SHUNTS MV E/A ratio:  3.15         Systemic VTI:  0.23 m                             Systemic Diam: 2.00 cm Isaias Cowman MD Electronically signed by Isaias Cowman MD Signature Date/Time: 09/09/2022/7:58:45 AM    Final    DG Chest Port 1 View  Result Date: 09/08/2022 CLINICAL DATA:  Acute on chronic congestive heart failure. EXAM: PORTABLE CHEST 1 VIEW COMPARISON:  09/06/22 FINDINGS: Stable cardiomediastinal contours. Increased volume of left pleural effusion. Now moderate to large. Small right pleural effusion is improved in the interval. Similar appearance of mild diffuse interstitial edema. IMPRESSION: 1. Increased volume of left pleural effusion. 2. Improved right pleural effusion. 3. Persistent mild interstitial edema. Electronically Signed   By: Kerby Moors M.D.   On: 09/08/2022 08:16   NM Pulmonary Perfusion  Result Date: 09/07/2022  CLINICAL DATA:  Pulmonary embolism suspected, low to intermediate probability. Abnormal D-dimer. EXAM: NUCLEAR MEDICINE PERFUSION LUNG SCAN TECHNIQUE: Perfusion images were obtained in multiple projections after intravenous injection of radiopharmaceutical. Ventilation scans intentionally deferred if perfusion scan and chest x-ray adequate for interpretation during COVID 19 epidemic. RADIOPHARMACEUTICALS:  4.17 mCi Tc-12mMAA IV COMPARISON:  Chest radiography yesterday FINDINGS: No segmental or subsegmental defects suggestive of embolic disease.  Defects related to layering pleural effusions. IMPRESSION: No finding to suggest pulmonary emboli. Layering pleural effusions, but no sign of arterial distribution hypoperfusion. Electronically Signed   By: MNelson ChimesM.D.   On: 09/07/2022 08:59   UKoreaVenous Img Lower Bilateral (DVT)  Result Date: 09/06/2022 CLINICAL DATA:  Leg swelling bilateral EXAM: Bilateral LOWER EXTREMITY VENOUS DOPPLER ULTRASOUND TECHNIQUE: Gray-scale sonography with compression, as well as color and duplex ultrasound, were performed to evaluate the deep venous system(s) from the level of the common femoral vein through the popliteal and proximal calf veins. COMPARISON:  None Available. FINDINGS: VENOUS Normal compressibility of the common femoral, superficial femoral, and popliteal veins, as well as the visualized calf veins. Visualized portions of profunda femoral vein and great saphenous vein unremarkable. No filling defects to suggest DVT on grayscale or color Doppler imaging. Doppler waveforms show normal direction of venous flow, normal respiratory plasticity and response to augmentation. Limited views of the contralateral common femoral vein are unremarkable. OTHER Scattered soft tissue edema Limitations: none IMPRESSION: No evidence of bilateral lower extremity DVT.  Soft tissue edema Electronically Signed   By: AJill SideM.D.   On: 09/06/2022 18:57   DG Chest Port 1 View  Result Date: 09/06/2022 CLINICAL DATA:  Shortness of breath EXAM: PORTABLE CHEST 1 VIEW COMPARISON:  Chest x-ray 04/29/2022 FINDINGS: Small bilateral pleural effusions are present, left greater than right. There central pulmonary vascular congestion. There are patchy opacities in the lung bases. The heart is enlarged, unchanged. There is no pneumothorax or acute fracture. Cervical spinal fusion plate is present. IMPRESSION: 1. Cardiomegaly with central pulmonary vascular congestion and small bilateral pleural effusions. 2. Patchy opacities in the lung  bases may reflect edema or infection. Electronically Signed   By: ARonney AstersM.D.   On: 09/06/2022 17:32   MR BRAIN WO CONTRAST  Result Date: 08/18/2022 CLINICAL DATA:  Bilateral visual loss. EXAM: MRI HEAD WITHOUT CONTRAST TECHNIQUE: Multiplanar, multiecho pulse sequences of the brain and surrounding structures were obtained without intravenous contrast. COMPARISON:  Head CT 04/14/2022 and MRI 08/06/2021 FINDINGS: Brain: There is no evidence of an acute infarct, mass, midline shift, or extra-axial fluid collection. A few scattered chronic cerebral microhemorrhages are again noted. T2 hyperintensities in the cerebral white matter and pons have mildly progressed from the prior MRI and are nonspecific but compatible with mild chronic small vessel ischemic disease. Mild cerebral atrophy is within normal limits for age. Vascular: Major intracranial vascular flow voids are preserved. Skull and upper cervical spine: Unremarkable bone marrow signal. Prior anterior cervical spine fusion. Sinuses/Orbits: Bilateral cataract extraction. No significant inflammatory changes in the paranasal sinuses. Clear mastoid air cells. Other: None. IMPRESSION: 1. No acute intracranial abnormality. 2. Mild chronic small vessel ischemic disease, mildly progressed from 2023. Electronically Signed   By: ALogan BoresM.D.   On: 08/18/2022 07:53    ECHO 04/2022  1. Left ventricular ejection fraction, by estimation, is 60 to 65%. The  left ventricle has normal function. The left ventricle has no regional  wall motion abnormalities. There is severe concentric left  ventricular  hypertrophy. Left ventricular diastolic   function could not be evaluated.   2. Right ventricular systolic function is moderately reduced. The right  ventricular size is mildly enlarged. Severely increased right ventricular  wall thickness.   3. Left atrial size was severely dilated.   4. Right atrial size was severely dilated.   5. A small pericardial  effusion is present. The pericardial effusion is  circumferential.   6. The mitral valve is grossly normal. Mild mitral valve regurgitation.   7. The aortic valve is calcified. Aortic valve regurgitation is not  visualized. Aortic valve sclerosis is present, with no evidence of aortic  valve stenosis.   TELEMETRY reviewed by me (LT) 09/14/2022 : Atrial fibrillation rate 70s to 80s  EKG reviewed by me: AF rate 107  Data reviewed by me (LT) 09/14/2022: Hospitalist progress note, last 24h vitals tele labs imaging I/O    Principal Problem:   Acute on chronic diastolic (congestive) heart failure (HCC) Active Problems:   Renal transplant recipient   Dyslipidemia   Chest pain   Elevated d-dimer   Paroxysmal atrial fibrillation ([place])   Type 2 diabetes mellitus with chronic kidney disease, with long-term current use of insulin (HCC)   Hypertensive urgency   Diabetic neuropathy ([place])   Acute on chronic diastolic heart failure ([place])   Protein-calorie malnutrition, severe    ASSESSMENT AND PLAN:  Deenah L. Tyrionna Aanenson is a 23yoF with a PMH of paroxysmal AF (flecainide, Eliquis), HFpEF (60-65%, moderate MR 04/2022), ESRD s/p bilateral renal transplants, DM2 who presented to [facility] ED 09/06/2022 with chest tightness, peripheral edema, and orthopnea.  Cardiology is consulted for assistance with her heart failure.  # atypical chest pain  # Acute on chronic HFpEF # moderate LVH # mild AS Presents with 2 weeks of chest tightness she has a difficult time describing, other than finding relief with laying on her left side.  EKG nonacute although in AF RVR on admission,  BNP elevated at 650. Per collateral info from pt's sister in law, some concern for medication noncompliance (prescribed lasix '20mg'$  PO daily). Echo without much change from prior, redemonstrated mod LVH, and G1DD. Volume status improving  -appreciate nephrology assistance, continues on PO lasix 40 daily  -stop lisinopril 30 mg daily -other  GDMT limited by renal insufficiency   # Paroxysmal atrial fibrillation/flutter Currently rate controlled 60s-90s on telemetry, -continue cardizem to '60mg'$  BID  -continue amiodarone '200mg'$  BID x 14 days, then '200mg'$  daily thereafter -continue Eliquis from '5mg'$  BID to 2.'5mg'$  BID with Cr increase and baseline weight 49kg for stroke prevention.  # S/p renal transplants # AKI on CKD 3 Chronically immunosuppressed, still makes urine. Followed by nephrology outpatient. Cr baseline (1.29 - 1.33)  -  Cr continues to uptrend from 1.29 on admission to 2.75 today, current GFR 18.  - diuretics per nephrology   # Demand ischemia High-sensitivity troponin borderline elevated but flat trending at 61, 66 In the absence of ischemic EKG changes and in the setting of AoCHF this is most consistent with demand/supply mismatch and not ACS  -Continue atorvastatin  # failure to thrive Poor PO intake  and significant ongoing weakness, essentially bedbound.  - appreciate palliative care assistance    Cardiology will sign off. Please haiku with questions or re-engage if needed.    This patient's plan of care was discussed and created with Dr. Clayborn Bigness and he is in agreement.  Signed: Tristan Schroeder , PA-C 09/14/2022, 8:13 AM [facility]

## 2022-09-14 NOTE — Plan of Care (Signed)

## 2022-09-14 NOTE — Progress Notes (Signed)
Occupational Therapy Treatment Patient Details Name: Jean Johnson MRN: [id number] DOB: [birth year] Today's Date: 09/14/2022   History of present illness Maryama Emmendorfer is a 72 y.o. African-American female with medical history significant for end-stage renal disease s/p renal transplant x2, hypertension, dyslipidemia, hypothyroidism, paroxysmal atrial fibrillation on Eliquis, PUD, type 2 diabetes mellitus with diabetic neuropathy, who presented to the emergency room with acute onset of worsening dyspnea with associated chest pain which has been progressively worse over the last week.   OT comments  [name] was seen for OT treatment on this date. Upon arrival to room pt reclined in bed, agreeable to tx. Pt requires MAX A exit bed, tolerates ~10 min static sitting, requires MOD A 2/2 posterior/L lateral lean. MOD A face washing, assist for sitting balance. Completes 5 reps BLE LAQ in sitting. Pt demonstrates improved sitting tolerance, will continue to follow POC. Discharge recommendation updated to STR with plans to transition to LTC as pt family reports pt will no longer have 24/7 assistance.      Recommendations for follow up therapy are one component of a multi-disciplinary discharge planning process, led by the attending physician.  Recommendations may be updated based on patient status, additional functional criteria and insurance authorization.    Follow Up Recommendations  Skilled nursing-short term rehab (<3 hours/day)     Assistance Recommended at Discharge Frequent or constant Supervision/Assistance  Patient can return home with the following  Two people to help with walking and/or transfers;A lot of help with bathing/dressing/bathroom   Equipment Recommendations  Hospital bed    Recommendations for Other Services      Precautions / Restrictions Precautions Precautions: Fall Precaution Comments: Sacral wound; aspiration; L UE AV fistula Restrictions Weight Bearing  Restrictions: No       Mobility Bed Mobility Overal bed mobility: Needs Assistance Bed Mobility: Supine to Sit, Sit to Supine, Rolling Rolling: Max assist, +2 for physical assistance   Supine to sit: Max assist Sit to supine: Max assist, +2 for physical assistance        Transfers                   General transfer comment: pt declined 2/2 fatigue, anticipate requiring x2     Balance Overall balance assessment: Needs assistance Sitting-balance support: Feet unsupported, Bilateral upper extremity supported Sitting balance-Leahy Scale: Poor Sitting balance - Comments: mod assist d/t L lateral posterior lean Postural control: Left lateral lean, Posterior lean                                 ADL either performed or assessed with clinical judgement   ADL Overall ADL's : Needs assistance/impaired                                       General ADL Comments: MAX A x2 don B socks seated EOB. MAX A x2 simulated toileting at bed level      Cognition Arousal/Alertness: Awake/alert Behavior During Therapy: WFL for tasks assessed/performed Overall Cognitive Status: Within Functional Limits for tasks assessed  Pertinent Vitals/ Pain       Pain Assessment Pain Assessment: Faces Faces Pain Scale: Hurts little more Pain Location: sacral, L knee Pain Descriptors / Indicators: Aching, Discomfort, Constant Pain Intervention(s): Limited activity within patient's tolerance, Repositioned   Frequency  Min 2X/week        Progress Toward Goals  OT Goals(current goals can now be found in the care plan section)  Progress towards OT goals: Progressing toward goals  Acute Rehab OT Goals Patient Stated Goal: go to rehab OT Goal Formulation: With patient/family Time For Goal Achievement: 09/21/22 Potential to Achieve Goals: Good ADL Goals Pt Will Perform Grooming: with  min assist;sitting Pt Will Perform Lower Body Dressing: with mod assist;sitting/lateral leans Pt Will Transfer to Toilet: with min assist Pt Will Perform Toileting - Clothing Manipulation and hygiene: with mod assist  Plan Frequency remains appropriate;Discharge plan needs to be updated    Co-evaluation                 AM-PAC OT "6 Clicks" Daily Activity     Outcome Measure   Help from another person eating meals?: A Little Help from another person taking care of personal grooming?: A Little Help from another person toileting, which includes using toliet, bedpan, or urinal?: Total Help from another person bathing (including washing, rinsing, drying)?: Total Help from another person to put on and taking off regular upper body clothing?: A Lot Help from another person to put on and taking off regular lower body clothing?: A Lot 6 Click Score: 12    End of Session    OT Visit Diagnosis: Unsteadiness on feet (R26.81);Muscle weakness (generalized) (M62.81)   Activity Tolerance Patient tolerated treatment well   Patient Left in bed;with call bell/phone within reach;with family/visitor present   Nurse Communication          Time: [id number] OT Time Calculation (min): 23 min  Charges: OT General Charges $OT Visit: 1 Visit OT Treatments $Self Care/Home Management : 23-37 mins  Dessie Coma, M.S. OTR/L  09/14/22, 1:28 PM  ascom [phone]

## 2022-09-15 DIAGNOSIS — I5033 Acute on chronic diastolic (congestive) heart failure: Secondary | ICD-10-CM | POA: Diagnosis not present

## 2022-09-15 DIAGNOSIS — Z7189 Other specified counseling: Secondary | ICD-10-CM | POA: Diagnosis not present

## 2022-09-15 LAB — BASIC METABOLIC PANEL
Anion gap: 9 (ref 5–15)
BUN: 56 mg/dL — ABNORMAL HIGH (ref 8–23)
CO2: 24 mmol/L (ref 22–32)
Calcium: 8.3 mg/dL — ABNORMAL LOW (ref 8.9–10.3)
Chloride: 110 mmol/L (ref 98–111)
Creatinine, Ser: 2.83 mg/dL — ABNORMAL HIGH (ref 0.44–1.00)
GFR, Estimated: 17 mL/min — ABNORMAL LOW (ref >60)
Glucose, Bld: 88 mg/dL (ref 70–99)
Potassium: 4 mmol/L (ref 3.5–5.1)
Sodium: 143 mmol/L (ref 135–145)

## 2022-09-15 LAB — GLUCOSE, CAPILLARY
Glucose-Capillary: 104 mg/dL — ABNORMAL HIGH (ref 70–99)
Glucose-Capillary: 113 mg/dL — ABNORMAL HIGH (ref 70–99)
Glucose-Capillary: 119 mg/dL — ABNORMAL HIGH (ref 70–99)
Glucose-Capillary: 128 mg/dL — ABNORMAL HIGH (ref 70–99)
Glucose-Capillary: 148 mg/dL — ABNORMAL HIGH (ref 70–99)
Glucose-Capillary: 86 mg/dL (ref 70–99)
Glucose-Capillary: 87 mg/dL (ref 70–99)

## 2022-09-15 MED ORDER — FUROSEMIDE 40 MG PO TABS
40.0000 mg | ORAL_TABLET | Freq: Two times a day (BID) | ORAL | Status: DC
  Administered 2022-09-15 – 2022-09-21 (×12): 40 mg via ORAL
  Filled 2022-09-15 (×12): qty 1

## 2022-09-15 NOTE — Progress Notes (Signed)
Physical Therapy Treatment Patient Details Name: Jean Johnson MRN: [id number] DOB: 1951/03/26 Today's Date: 09/15/2022   History of Present Illness Jean Johnson is a 72 y.o. African-American female with medical history significant for end-stage renal disease s/p renal transplant x2, hypertension, dyslipidemia, hypothyroidism, paroxysmal atrial fibrillation on Eliquis, PUD, type 2 diabetes mellitus with diabetic neuropathy, who presented to the emergency room with acute onset of worsening dyspnea with associated chest pain which has been progressively worse over the last week.    PT Comments    Patient received in bed, RN and NT in room, just got patient cleaned up. Patient is more alert this session and agreeable to PT session. Patient tolerated BLE passive rom /exercises prior to bed mobility due to reported pain in LEs. L LE >pain than R LE with bed mobility. She has a difficult time sitting due to reported buttock and L LE hip pain. Required total assist to get fully upright at edge of bed propped with B UE support. Patient has poor sitting tolerance due to pain and fatigue. She will continue to benefit from skilled PT to improve strength and functional independence.    Recommendations for follow up therapy are one component of a multi-disciplinary discharge planning process, led by the attending physician.  Recommendations may be updated based on patient status, additional functional criteria and insurance authorization.  Follow Up Recommendations  Skilled nursing-short term rehab (<3 hours/day) Can patient physically be transported by private vehicle: No   Assistance Recommended at Discharge Frequent or constant Supervision/Assistance  Patient can return home with the following A lot of help with walking and/or transfers;A lot of help with bathing/dressing/bathroom;Assistance with cooking/housework;Assistance with feeding;Direct supervision/assist for medications  management;Direct supervision/assist for financial management;Assist for transportation;Help with stairs or ramp for entrance   Equipment Recommendations  None recommended by PT    Recommendations for Other Services       Precautions / Restrictions Precautions Precautions: Fall Precaution Comments: Sacral wound; aspiration; L UE AV fistula, painful L LE Restrictions Weight Bearing Restrictions: No     Mobility  Bed Mobility Overal bed mobility: Needs Assistance Bed Mobility: Supine to Sit, Sit to Supine     Supine to sit: Max assist Sit to supine: Max assist   General bed mobility comments: Patient able to assist more this session, but still needs Max assist due to pain and weakness. She is able to use bed rails to assist some.    Transfers                   General transfer comment: unable due to poor sitting balance/tolerance, patient declines attempting    Ambulation/Gait                   Stairs             Wheelchair Mobility    Modified Rankin (Stroke Patients Only)       Balance Overall balance assessment: Needs assistance Sitting-balance support: Feet supported, Bilateral upper extremity supported Sitting balance-Leahy Scale: Poor Sitting balance - Comments: Right lateral leaning due to sore on bottom and L LE pain. Requires max assist to get postitoned edge of bed in sitting. Able to tolerate for brief period before wanting to lie back down. Forward flexed trunk and neck posture in sitting. Postural control: Right lateral lean, Posterior lean  Cognition Arousal/Alertness: Awake/alert Behavior During Therapy: WFL for tasks assessed/performed Overall Cognitive Status: Within Functional Limits for tasks assessed                                          Exercises Other Exercises Other Exercises: PROM to B LEs as they are very painful with movement.    General  Comments        Pertinent Vitals/Pain Pain Assessment Pain Assessment: Faces Faces Pain Scale: Hurts even more Pain Location: L LE with movement, R LE with movement, bottom Pain Descriptors / Indicators: Discomfort, Grimacing, Guarding, Moaning, Sore Pain Intervention(s): Monitored during session, Repositioned    Home Living                          Prior Function            PT Goals (current goals can now be found in the care plan section) Acute Rehab PT Goals Patient Stated Goal: to go to rehab PT Goal Formulation: With patient Time For Goal Achievement: 09/22/22 Potential to Achieve Goals: Good Progress towards PT goals: Progressing toward goals (more alert and able to assist this session)    Frequency    Min 2X/week      PT Plan Discharge plan needs to be updated    Co-evaluation              AM-PAC PT "6 Clicks" Mobility   Outcome Measure  Help needed turning from your back to your side while in a flat bed without using bedrails?: A Lot Help needed moving from lying on your back to sitting on the side of a flat bed without using bedrails?: A Lot Help needed moving to and from a bed to a chair (including a wheelchair)?: Total Help needed standing up from a chair using your arms (e.g., wheelchair or bedside chair)?: Total Help needed to walk in hospital room?: Total Help needed climbing 3-5 steps with a railing? : Total 6 Click Score: 8    End of Session   Activity Tolerance: Patient limited by pain;Patient limited by fatigue Patient left: in bed;with call bell/phone within reach Nurse Communication: Mobility status PT Visit Diagnosis: Other abnormalities of gait and mobility (R26.89);Muscle weakness (generalized) (M62.81)     Time: 1255-1310 PT Time Calculation (min) (ACUTE ONLY): 15 min  Charges:  $Therapeutic Activity: 8-22 mins                     Willean Schurman, PT, GCS 09/15/22,1:21 PM

## 2022-09-15 NOTE — TOC Progression Note (Signed)
Transition of Care [facility]) - Progression Note    Patient Details  Name: Jean Johnson MRN: [id number] Date of Birth: 1951-02-27  Transition of Care [facility]) CM/SW [place], LCSW Phone Number: 09/15/2022, 2:30 PM  Clinical Narrative:   Patient and husband are agreeable to SNF placement. Will provide bed offers once available.  Expected Discharge Plan: [place] Barriers to Discharge: Continued Medical Work up  Expected Discharge Plan and Services       Living arrangements for the past 2 months: [place]: [place] ([place]) Date [place]: 09/08/22 Time [place]: [place] Representative spoke with at [place]: [place] ([place]) Interventions SDOH Screenings   Food Insecurity: No Food Insecurity (09/07/2022)  Housing: Low Risk  (09/07/2022)  Transportation Needs: No Transportation Needs (09/07/2022)  Utilities: Not At Risk (09/07/2022)  Tobacco Use: Low Risk  (09/10/2022)    Readmission Risk Interventions    09/08/2022    4:23 PM 04/27/2022    3:34 PM  Readmission Risk Prevention Plan  Transportation Screening Complete Complete  HRI or Home Care Consult  Complete  Social Work Consult for [place] Planning/Counseling  Complete  Palliative Care Screening  Not Applicable  Medication Review Press photographer) Complete Complete  PCP or Specialist appointment within 3-5 days of discharge Complete   HRI or [place] Complete   SW Recovery Care/Counseling Consult Complete   Palliative Care Screening Not Complete   Skilled Nursing Facility Complete

## 2022-09-15 NOTE — Progress Notes (Signed)
Daily Progress Note   Patient Name: Jean Johnson       Date: 09/15/2022 DOB: 10/11/1950  Age: 72 y.o. MRN#: [id number] Attending Physician: Emeterio Reeve, DO Primary Care Physician: Gladstone Lighter, MD Admit Date: 09/06/2022  Reason for Consultation/Follow-up: Establishing goals of care  Subjective: Notes and labs reviewed. In to see patient.  Resting in bed at this time.  Husband and sister-in-law are at bedside.  They discussed wanting to have HPOA papers notarized today.  Per nursing at bedside patient is doing better with eating and drinking, and not pocketing food.  Plans for rehab.  PMT will shadow.  Length of Stay: 9  Current Medications: Scheduled Meds:   amiodarone  200 mg Oral BID   Followed by   Derrill Memo ON 09/27/2022] amiodarone  200 mg Oral Daily   apixaban  2.5 mg Oral BID   vitamin C  500 mg Oral BID   atorvastatin  10 mg Oral Daily   diltiazem  60 mg Oral Q12H   docusate sodium  100 mg Oral BID   ezetimibe  10 mg Oral Daily   feeding supplement  237 mL Oral TID BM   furosemide  40 mg Oral BID   insulin aspart  0-9 Units Subcutaneous Q4H   multivitamin with minerals  1 tablet Oral Daily   mycophenolate  500 mg Oral BID   mouth rinse  15 mL Mouth Rinse 4 times per day   potassium chloride SA  20 mEq Oral Daily   pregabalin  50 mg Oral Daily   tacrolimus  2 mg Oral BID   zinc sulfate  220 mg Oral Daily    Continuous Infusions:   PRN Meds: acetaminophen **OR** acetaminophen, iohexol, labetalol, magnesium hydroxide, morphine injection, nitroGLYCERIN, ondansetron **OR** ondansetron (ZOFRAN) IV, mouth rinse, traZODone  Physical Exam Pulmonary:     Effort: Pulmonary effort is normal.  Neurological:     Mental Status: She is alert.             Vital  Signs: BP (!) 102/52 (BP Location: Right Arm)   Pulse (!) 111   Temp 98.7 F (37.1 C)   Resp 16   Ht '5\' 2"'$  (1.575 m)   Wt 50.4 kg   SpO2 92%   BMI 20.32 kg/m  SpO2: SpO2: 92 %  O2 Device: O2 Device: Room Air O2 Flow Rate:    Intake/output summary:  Intake/Output Summary (Last 24 hours) at 09/15/2022 1148 Last data filed at 09/15/2022 0900 Gross per 24 hour  Intake 240 ml  Output --  Net 240 ml   LBM: Last BM Date : 09/14/22 Baseline Weight: Weight: 46.7 kg Most recent weight: Weight: 50.4 kg        Patient Active Problem List   Diagnosis Date Noted   Protein-calorie malnutrition, severe 09/13/2022   Acute on chronic diastolic (congestive) heart failure ([place]) 09/06/2022   Chest pain 09/06/2022   Elevated d-dimer 09/06/2022   Paroxysmal atrial fibrillation ([place]) 09/06/2022   Type 2 diabetes mellitus with chronic kidney disease, with long-term current use of insulin ([place]) 09/06/2022   Hypertensive urgency 09/06/2022   Diabetic neuropathy ([place]) 09/06/2022   Acute on chronic diastolic heart failure ([place]) 09/06/2022   Pain and swelling of left lower leg 08/28/2022   Visual changes 08/24/2022   Headache 08/24/2022   Pleural effusion due to CHF (congestive heart failure) ([place]) 04/30/2022   Acute heart failure with preserved ejection fraction (HFpEF) ([place]) 04/30/2022   Pressure injury of skin 04/27/2022   AKI (acute kidney injury) ([place]) on CKD 3b/4 04/27/2022   Intractable nausea and vomiting 04/25/2022   Atrial fibrillation with rapid ventricular response ([place]) 04/25/2022   Type 2 diabetes mellitus with peripheral neuropathy ([place]) 04/25/2022   Renal transplant recipient 04/25/2022   Dyslipidemia 04/25/2022   Generalized weakness    Acute on chronic diastolic CHF (congestive heart failure) ([place]) 99991111   Metabolic acidosis 99991111   Acute urinary retention 04/18/2022   Syncope 04/14/2022   Atrial fibrillation, chronic ([place]) 04/14/2022   Type II diabetes mellitus  with renal manifestations ([place]) 04/14/2022   HLD (hyperlipidemia)    Hypokalemia    Acute bronchitis due to COVID-19 virus    Acute renal failure superimposed on stage 3a chronic kidney disease ([place])    Myocardial injury    Hypoglycemia    Cervical myelopathy ([place]) 03/24/2021   Anemia 12/03/2020   Essential hypertension 12/03/2020   Hypothyroidism 12/03/2020   Type 2 diabetes mellitus ([place]) 12/03/2020   Atrial fibrillation ([place]) 09/21/2018   Kidney transplant status 09/21/2018   Osteoporosis 09/21/2018    Palliative Care Assessment & Plan    Recommendations/Plan: Continue full code/full scope treatment. PMT will shadow  Code Status:    Code Status Orders  (From admission, onward)           Start     Ordered   09/06/22 2029  Full code  Continuous       Question:  By:  Answer:  Consent: discussion documented in EHR   09/06/22 2042           Code Status History     Date Active Date Inactive Code Status Order ID Comments User Context   04/25/2022 2224 05/02/2022 2227 Full Code [id number]  Sidney Ace Arvella Merles, MD ED   04/14/2022 0805 04/19/2022 1950 Full Code [id number]  Ivor Costa, MD ED   03/24/2021 1513 03/27/2021 2131 Full Code [id number]  Loleta Dicker, PA Inpatient       Thank you for allowing the Palliative Medicine Team to assist in the care of this patient.   Asencion Gowda, NP  Please contact Palliative Medicine Team phone at [phone] for questions and concerns.

## 2022-09-15 NOTE — Progress Notes (Signed)
PROGRESS NOTE    Jean Johnson   [id number] DOB: July 21, 1950  DOA: 09/06/2022 Date of Service: 09/15/22 PCP: Gladstone Lighter, MD     Brief Narrative / Hospital Course:  72 year old African-American female with past medical history significant for ESRD s/p renal transplant x 2 now having CKD stage IIIb, hypertension, dyslipidemia, hypothyroidism, PAF on anticoagulation with Eliquis, PUD, type 2 diabetes mellitus with diabetic neuropathy as well as other comorbidities who presented to the ED with acute worsening of her dyspnea. She has had diminished urine output without dysuria urinary frequency or flank pain.  02/27: BP was 172/91 and heart rate 103. EKG Aflutter w/ variable AV block. Chest x-ray showed cardiomegaly with central pulmonary vascular congestion and small bilateral pleural effusions and patchy opacities in the lung bases that may reflect edema or infection. BUN 38 and creatinine 1.33. Was given 4 baby aspirin, 0.4 mg sublingual nitroglycerin and 20 mg of IV Lasix. VQ low probability PE 02/28: cardiology saw patient - continue IV lasix bid, echo done  02/29: renal fxn worsening, lasix to continuous infusion, d/c lisinopril  03/01: Nephrology consulted, lasix drip increased. Echo with moderate LVH, mild AS, and mild-moderate TR, overall similar to prior from 2023    03/02-03/04: Remain on lasix gtt.  03/04: transition from lasix gtt to po  03/05: remained stable  03/06: Cardiology s/o. Pt not qualifying for rehab, Palliative care [place] discussion.  03/07: PT/OT recs for SNF, TOC following.     Consultants:  Cardiology Palliative Care  Nephrology   Procedures: none      ASSESSMENT & PLAN:   Principal Problem:   Acute on chronic diastolic (congestive) heart failure (HCC) Active Problems:   Hypertensive urgency   Chest pain   Elevated d-dimer   Renal transplant recipient   Dyslipidemia   Paroxysmal atrial fibrillation ([place])   Type 2 diabetes mellitus  with chronic kidney disease, with long-term current use of insulin (HCC)   Diabetic neuropathy (HCC)   Acute on chronic diastolic heart failure (HCC)   Protein-calorie malnutrition, severe   Acute on chronic diastolic congestive heart failure - stable/baseline  Hypertensive urgency - resolved  Lasix drip to oral Lasix on 09/12/2022 ACE inhibitor on hold at this time in the setting of increasing creatinine, other GDMT limited by renal function  Strict I's and O's, low-salt diet, daily weights Cardiology s/o 03/06 Nephrology following.     Paroxysmal atrial fibrillation with rapid ventricular response, now rate controlled Eliquis 2.5 mg twice daily Cardizem 120 mg twice daily Given worsening renal function flecainide was switched to amiodarone '200mg'$  BID x 14 days, then '200mg'$  daily thereafter    ESRD s/p renal transplant x 2 Chronic immunosuppression Acute on chronic renal failure likely multifactorial in the setting of diuresis as well as hepatorenal syndrome Still makes urine.  Followed by nephrology outpatient. Baseline creatinine 1.29-1.33 Continue immunosuppressive regimen of CellCept and tacrolimus - tacrolimus decreased today  Nephrology on board we appreciate recommendations   Elevated troponin likely demand ischemia Dyslipidemia Borderline elevation.  Flat trend Not indicative of ACS Continue continue aspirin and statin therapy   Diabetic neuropathy (HCC) Continue with Pregabalin 50 mg p.o. twice daily --> reduced d/t renal function    Type 2 diabetes mellitus with chronic kidney disease, with long-term current use of insulin (HCC) continue insulin therapy Continue to Monitor CBG's per Protocol        DVT prophylaxis: eliquis Pertinent IV fluids/nutrition: no continuous IV fluids, poor po intake  Central lines /  invasive devices: none  Code Status: FULL CODE   Current Admission Status: inpatient   TOC needs / Dispo plan: SNF Barriers to discharge / significant  pending items: SNF placement              Subjective / Brief ROS:  Patient reports no concerns other than low back pain  Denies CP/SOB.  Denies new weakness.  Tolerating diet.    Family Communication: family at bedside on rounds     Objective Findings:  Vitals:   09/14/22 1951 09/14/22 2109 09/15/22 0446 09/15/22 0747  BP: 129/74 132/66 105/86 (!) 102/52  Pulse: 89 88 87 (!) 111  Resp: '18 16 16 16  '$ Temp: 98.1 F (36.7 C) 99.3 F (37.4 C) 99.8 F (37.7 C) 98.7 F (37.1 C)  TempSrc: Axillary Oral Oral   SpO2: 97% 95% 95% 92%  Weight:      Height:        Intake/Output Summary (Last 24 hours) at 09/15/2022 1329 Last data filed at 09/15/2022 0900 Gross per 24 hour  Intake 240 ml  Output --  Net 240 ml   Filed Weights   09/09/22 1347 09/10/22 0600 09/11/22 0334  Weight: 50.9 kg 52.7 kg 50.4 kg    Examination:  Physical Exam Constitutional:      General: She is not in acute distress.    Appearance: She is not toxic-appearing.  Cardiovascular:     Rate and Rhythm: Normal rate. Rhythm irregular.  Pulmonary:     Effort: Pulmonary effort is normal.     Breath sounds: Examination of the right-lower field reveals decreased breath sounds. Examination of the left-lower field reveals decreased breath sounds. Decreased breath sounds present.  Musculoskeletal:     Right lower leg: No edema.     Left lower leg: No edema.  Skin:    General: Skin is warm and dry.  Neurological:     Mental Status: She is alert and oriented to person, place, and time.  Psychiatric:        Mood and Affect: Mood normal.        Behavior: Behavior normal.          Scheduled Medications:   amiodarone  200 mg Oral BID   Followed by   Derrill Memo ON 09/27/2022] amiodarone  200 mg Oral Daily   apixaban  2.5 mg Oral BID   vitamin C  500 mg Oral BID   atorvastatin  10 mg Oral Daily   diltiazem  60 mg Oral Q12H   docusate sodium  100 mg Oral BID   ezetimibe  10 mg Oral Daily   feeding  supplement  237 mL Oral TID BM   furosemide  40 mg Oral BID   insulin aspart  0-9 Units Subcutaneous Q4H   multivitamin with minerals  1 tablet Oral Daily   mycophenolate  500 mg Oral BID   mouth rinse  15 mL Mouth Rinse 4 times per day   potassium chloride SA  20 mEq Oral Daily   pregabalin  50 mg Oral Daily   tacrolimus  2 mg Oral BID   zinc sulfate  220 mg Oral Daily    Continuous Infusions:   PRN Medications:  acetaminophen **OR** acetaminophen, iohexol, labetalol, magnesium hydroxide, morphine injection, nitroGLYCERIN, ondansetron **OR** ondansetron (ZOFRAN) IV, mouth rinse, traZODone  Antimicrobials from admission:  Anti-infectives (From admission, onward)    None           Data Reviewed:  I have personally reviewed  the following...  CBC: Recent Labs  Lab 09/10/22 0533 09/11/22 0507 09/12/22 0628 09/13/22 0653 09/14/22 0340  WBC 8.6 8.7 9.5 8.3 7.3  NEUTROABS 6.5 6.4 6.9 6.1 5.4  HGB 10.1* 10.1* 9.9* 10.0* 9.2*  HCT 33.0* 32.3* 31.7* 32.4* 28.8*  MCV 98.5 98.2 97.8 98.2 95.4  PLT 101* 110* 120* 125* [id number]*   Basic Metabolic Panel: Recent Labs  Lab 09/09/22 0556 09/10/22 0533 09/11/22 0507 09/12/22 0628 09/13/22 0653 09/14/22 0340 09/15/22 0527  NA 141   < > 140 141 139 142 143  K 3.7   < > 3.7 4.2 4.0 4.0 4.0  CL 109   < > 108 107 108 109 110  CO2 23   < > '25 24 25 24 24  '$ GLUCOSE 86   < > 88 97 88 83 88  BUN 44*   < > 43* 44* 50* 52* 56*  CREATININE 1.83*   < > 2.24* 2.44* 2.75* 2.75* 2.83*  CALCIUM 7.8*   < > 8.0* 8.0* 8.2* 8.2* 8.3*  MG 1.9  --   --   --   --   --   --    < > = values in this interval not displayed.   GFR: Estimated Creatinine Clearance: 14 mL/min (A) (by C-G formula based on SCr of 2.83 mg/dL (H)). Liver Function Tests: No results for input(s): "AST", "ALT", "ALKPHOS", "BILITOT", "PROT", "ALBUMIN" in the last 168 hours.  No results for input(s): "LIPASE", "AMYLASE" in the last 168 hours. No results for input(s):  "AMMONIA" in the last 168 hours. Coagulation Profile: No results for input(s): "INR", "PROTIME" in the last 168 hours. Cardiac Enzymes: No results for input(s): "CKTOTAL", "CKMB", "CKMBINDEX", "TROPONINI" in the last 168 hours. BNP (last 3 results) No results for input(s): "PROBNP" in the last 8760 hours. HbA1C: No results for input(s): "HGBA1C" in the last 72 hours. CBG: Recent Labs  Lab 09/14/22 2004 09/15/22 0002 09/15/22 0439 09/15/22 1010 09/15/22 1142  GLUCAP 98 119* 86 113* 148*   Lipid Profile: No results for input(s): "CHOL", "HDL", "LDLCALC", "TRIG", "CHOLHDL", "LDLDIRECT" in the last 72 hours. Thyroid Function Tests: No results for input(s): "TSH", "T4TOTAL", "FREET4", "T3FREE", "THYROIDAB" in the last 72 hours. Anemia Panel: No results for input(s): "VITAMINB12", "FOLATE", "FERRITIN", "TIBC", "IRON", "RETICCTPCT" in the last 72 hours. Most Recent Urinalysis On File:     Component Value Date/Time   COLORURINE STRAW (A) 09/06/2022 2353   APPEARANCEUR CLEAR (A) 09/06/2022 2353   APPEARANCEUR Hazy (A) 11/16/2020 1344   LABSPEC 1.007 09/06/2022 2353   PHURINE 5.0 09/06/2022 2353   GLUCOSEU NEGATIVE 09/06/2022 2353   HGBUR MODERATE (A) 09/06/2022 2353   [place] NEGATIVE 09/06/2022 2353   BILIRUBINUR Negative 11/16/2020 1344   KETONESUR NEGATIVE 09/06/2022 2353   PROTEINUR >=300 (A) 09/06/2022 2353   NITRITE NEGATIVE 09/06/2022 2353   LEUKOCYTESUR NEGATIVE 09/06/2022 2353   Sepsis Labs: '@LABRCNTIP'$ (procalcitonin:4,lacticidven:4) Microbiology: No results found for this or any previous visit (from the past 240 hour(s)).    Radiology Studies last 3 days: No results found.           LOS: 9 days       Emeterio Reeve, DO [facility] 09/15/2022, 1:29 PM    Dictation software may have been used to generate the above note. Typos may occur and escape review in typed/dictated notes. Please contact Dr Sheppard Coil directly for clarity if  needed.  Staff may message me via secure chat in [place]  but this may not  receive an immediate response,  please page me for urgent matters!  If 7PM-7AM, please contact night coverage [email or web address]

## 2022-09-15 NOTE — NC FL2 (Signed)
[place] LEVEL OF CARE FORM     IDENTIFICATION  Patient Name: Jean Johnson Birthdate: 02-10-1951 Sex: female Admission Date (Current Location): 09/06/2022  [place] and [place] Number:  Engineering geologist and Address:  [facility], [address] [place], [place] [address]      Provider Number: [id number]  Attending Physician Name and Address:  Emeterio Reeve, DO  Relative Name and Phone Number:       Current Level of Care: Hospital Recommended Level of Care: [place] Prior Approval Number:    Date Approved/Denied:   PASRR Number: [id number]  Discharge Plan: SNF    Current Diagnoses: Patient Active Problem List   Diagnosis Date Noted   Protein-calorie malnutrition, severe 09/13/2022   Acute on chronic diastolic (congestive) heart failure ([place]) 09/06/2022   Chest pain 09/06/2022   Elevated d-dimer 09/06/2022   Paroxysmal atrial fibrillation ([place]) 09/06/2022   Type 2 diabetes mellitus with chronic kidney disease, with long-term current use of insulin ([place]) 09/06/2022   Hypertensive urgency 09/06/2022   Diabetic neuropathy ([place]) 09/06/2022   Acute on chronic diastolic heart failure ([place]) 09/06/2022   Pain and swelling of left lower leg 08/28/2022   Visual changes 08/24/2022   Headache 08/24/2022   Pleural effusion due to CHF (congestive heart failure) ([place]) 04/30/2022   Acute heart failure with preserved ejection fraction (HFpEF) ([place]) 04/30/2022   Pressure injury of skin 04/27/2022   AKI (acute kidney injury) ([place]) on CKD 3b/4 04/27/2022   Intractable nausea and vomiting 04/25/2022   Atrial fibrillation with rapid ventricular response ([place]) 04/25/2022   Type 2 diabetes mellitus with peripheral neuropathy ([place]) 04/25/2022   Renal transplant recipient 04/25/2022   Dyslipidemia 04/25/2022   Generalized weakness    Acute on chronic diastolic CHF (congestive heart failure) ([place]) 99991111    Metabolic acidosis 99991111   Acute urinary retention 04/18/2022   Syncope 04/14/2022   Atrial fibrillation, chronic (HCC) 04/14/2022   Type II diabetes mellitus with renal manifestations ([place]) 04/14/2022   HLD (hyperlipidemia)    Hypokalemia    Acute bronchitis due to COVID-19 virus    Acute renal failure superimposed on stage 3a chronic kidney disease (HCC)    Myocardial injury    Hypoglycemia    Cervical myelopathy ([place]) 03/24/2021   Anemia 12/03/2020   Essential hypertension 12/03/2020   Hypothyroidism 12/03/2020   Type 2 diabetes mellitus ([place]) 12/03/2020   Atrial fibrillation ([place]) 09/21/2018   Kidney transplant status 09/21/2018   Osteoporosis 09/21/2018    Orientation RESPIRATION BLADDER Height & Weight     Self, Time, Situation, Place  Normal Incontinent, External catheter Weight: 111 lb 1.8 oz (50.4 kg) Height:  '5\' 2"'$  (157.5 cm)  BEHAVIORAL SYMPTOMS/MOOD NEUROLOGICAL BOWEL NUTRITION STATUS   (None)  (None) Incontinent Diet (DYS 1. Extra Gravies on meats, potatoes.  Yogurts, puddings.  May have Oatmeal per Speech ok w/ butter/sugar.)  AMBULATORY STATUS COMMUNICATION OF NEEDS Skin   Extensive Assist Verbally PU Stage and Appropriate Care     PU Stage 3 Dressing: Daily (Right sacrum: Foam. On low air loss mattress.)                 Personal Care Assistance Level of Assistance  Bathing, Feeding, Dressing Bathing Assistance: Maximum assistance Feeding assistance: Limited assistance Dressing Assistance: Maximum assistance     Functional Limitations Info  Sight, Hearing, Speech Sight Info: Adequate Hearing Info: Adequate Speech Info: Adequate    SPECIAL CARE FACTORS FREQUENCY  PT (By licensed PT), OT (By licensed OT)     PT Frequency: 5 x week OT Frequency: 5 x week            Contractures Contractures Info: Not present    Additional Factors Info  Code Status, Allergies Code Status Info: Full code Allergies Info: E-mycin (Erythromcin),  Penicillins           Current Medications (09/15/2022):  This is the current hospital active medication list Current Facility-Administered Medications  Medication Dose Route Frequency Provider Last Rate Last Admin   acetaminophen (TYLENOL) tablet 650 mg  650 mg Oral Q6H PRN Mansy, Jan A, MD   650 mg at 09/15/22 1004   Or   acetaminophen (TYLENOL) suppository 650 mg  650 mg Rectal Q6H PRN Mansy, Jan A, MD   650 mg at 09/10/22 1833   amiodarone (PACERONE) tablet 200 mg  200 mg Oral BID Tristan Schroeder, PA-C   200 mg at 09/15/22 1002   Followed by   Derrill Memo ON 09/27/2022] amiodarone (PACERONE) tablet 200 mg  200 mg Oral Daily Tang, Lily Michelle, PA-C       apixaban Arne [place]) tablet 2.5 mg  2.5 mg Oral BID Tristan Schroeder, PA-C   2.5 mg at 09/15/22 1004   ascorbic acid (VITAMIN C) tablet 500 mg  500 mg Oral BID Marguerita Merles T, MD   500 mg at 09/15/22 1003   atorvastatin (LIPITOR) tablet 10 mg  10 mg Oral Daily Mansy, Jan A, MD   10 mg at 09/15/22 1003   diltiazem (CARDIZEM) tablet 60 mg  60 mg Oral Q12H Tristan Schroeder, PA-C   60 mg at 09/15/22 1015   docusate sodium (COLACE) capsule 100 mg  100 mg Oral BID Marguerita Merles T, MD   100 mg at 09/15/22 1004   ezetimibe (ZETIA) tablet 10 mg  10 mg Oral Daily Mansy, Jan A, MD   10 mg at 09/15/22 1015   feeding supplement (ENSURE ENLIVE / ENSURE PLUS) liquid 237 mL  237 mL Oral TID BM Verline Lema, MD   237 mL at 09/15/22 1301   furosemide (LASIX) tablet 40 mg  40 mg Oral BID Colon Flattery, NP       insulin aspart (novoLOG) injection 0-9 Units  0-9 Units Subcutaneous Q4H Foust, Katy L, NP   1 Units at 09/15/22 1258   iohexol (OMNIPAQUE) 350 MG/ML injection 60 mL  60 mL Intravenous Once PRN Sheikh, Omair Latif, DO       labetalol (NORMODYNE) injection 20 mg  20 mg Intravenous Q3H PRN Mansy, Jan A, MD       magnesium hydroxide (MILK OF MAGNESIA) suspension 30 mL  30 mL Oral Daily PRN Mansy, Jan A, MD       morphine (PF) 2 MG/ML injection 2  mg  2 mg Intravenous Q2H PRN Mansy, Jan A, MD   2 mg at 09/14/22 1335   multivitamin with minerals tablet 1 tablet  1 tablet Oral Daily Marguerita Merles T, MD   1 tablet at 09/15/22 1002   mycophenolate (CELLCEPT) capsule 500 mg  500 mg Oral BID Mansy, Jan A, MD   500 mg at 09/15/22 1015   nitroGLYCERIN (NITROSTAT) SL tablet 0.4 mg  0.4 mg Sublingual Q5 min PRN Mumma, Larene [place], MD       ondansetron (ZOFRAN) tablet 4 mg  4 mg Oral Q6H PRN Mansy, Arvella Merles, MD       Or   ondansetron [facility])  injection 4 mg  4 mg Intravenous Q6H PRN Mansy, Arvella Merles, MD       Oral care mouth rinse  15 mL Mouth Rinse 4 times per day Marguerita Merles T, MD   15 mL at 09/15/22 1258   Oral care mouth rinse  15 mL Mouth Rinse PRN Marguerita Merles T, MD       potassium chloride SA (KLOR-CON M) CR tablet 20 mEq  20 mEq Oral Daily Mansy, Jan A, MD   20 mEq at 09/15/22 1002   pregabalin (LYRICA) capsule 50 mg  50 mg Oral Daily Emeterio Reeve, DO   50 mg at 09/15/22 1003   tacrolimus (PROGRAF) capsule 2 mg  2 mg Oral BID Colon Flattery, NP   2 mg at 09/15/22 1017   traZODone (DESYREL) tablet 25 mg  25 mg Oral QHS PRN Mansy, Jan A, MD   25 mg at 09/07/22 2157   zinc sulfate capsule 220 mg  220 mg Oral Daily Verline Lema, MD   220 mg at 09/15/22 1003     Discharge Medications: Please see discharge summary for a list of discharge medications.  Relevant Imaging Results:  Relevant Lab Results:   Additional Information SS#: [id number]. On low air loss mattress for sacral wound.  Candie Chroman, LCSW

## 2022-09-15 NOTE — Care Management Important Message (Signed)
Important Message  Patient Details  Name: Jean Johnson MRN: [id number] Date of Birth: [birth year]   Medicare Important Message Given:  Yes     Dannette Barbara 09/15/2022, 2:51 PM

## 2022-09-15 NOTE — Progress Notes (Signed)
Nutrition Follow-up  DOCUMENTATION CODES:   Severe malnutrition in context of chronic illness  INTERVENTION:   Ensure Enlive po TID, each supplement provides 350 kcal and 20 grams of protein.  Magic cup TID with meals, each supplement provides 290 kcal and 9 grams of protein  MVI po daily   Vitamin C '500mg'$  po BID  Zinc '220mg'$  po daily x 14 days  Assist with meals  Pt at high refeed risk; recommend monitor potassium, magnesium and phosphorus labs daily until stable  Daily weights   NUTRITION DIAGNOSIS:   Severe Malnutrition related to chronic illness (ESRD s/p renal transplant x 2) as evidenced by moderate fat depletion, severe fat depletion, moderate muscle depletion, severe muscle depletion, edema. -ongoing   GOAL:   Patient will meet greater than or equal to 90% of their needs -not met   MONITOR:   PO intake, Supplement acceptance, Labs, Weight trends, I & O's, Skin  ASSESSMENT:   72 y/o female with h/o CHF, renal transplant x 2 on chronic immunosuppression, now CKD III, HLD, PAF, DM, neuropathy, HTN, hypothyroidism, PUD, perianal fistula, kidney stones, spondylosis s/p spinal fusion who is admitted with CHF.  Pt with poor appetite and oral intake since admission; pt documented to be eating <25% of meals. Pt noted to be pocketing food. Pt seen by SLP 3/5 and placed on a pureed diet. Pt appears to be doing better with the pureed diet with meal assistance. Pt documented to have eaten 75% of breakfast today and 50% of lunch. Pt is drinking some of the Ensure supplements. Pt remains at high refeed risk. Per chart, pt appears weight stable at baseline and since admission. Plan is for SNF at discharge.   Medications reviewed and include: vitamin C, colace, lasix, insulin, MVI, cellcept, Kcl, zinc  Labs reviewed: K 4.0 wnl, BUN 56(H), creat 2.83(H) Hgb 9.2(L), Hct 28.8(L) Cbgs- 148, 113, 86, 119 x 24 hrs   Diet Order:   Diet Order             DIET - DYS 1 Room service  appropriate? Yes with Assist; Fluid consistency: Thin  Diet effective now                  EDUCATION NEEDS:   Education needs have been addressed  Skin:  Skin Assessment: Reviewed RN Assessment (Stage III sacrum- 5cm x 3cm x 0.5cm)  Last BM:  3/7- type 4  Height:   Ht Readings from Last 1 Encounters:  09/06/22 '5\' 2"'$  (1.575 m)    Weight:   Wt Readings from Last 1 Encounters:  09/11/22 50.4 kg    Ideal Body Weight:  50 kg  BMI:  Body mass index is 20.32 kg/m.  Estimated Nutritional Needs:   Kcal:  1400-1600kcal/day  Protein:  70-80g/day  Fluid:  1.3-1.5L/day  Koleen Distance MS, RD, LDN Please refer to [facility] for RD and/or RD on-call/weekend/after hours pager

## 2022-09-15 NOTE — Progress Notes (Signed)
[facility]  ROUNDING NOTE   Subjective:   Jean Johnson is a 72 y.o. female with past medical conditions including diabetes, atrial fibrillation on Eliquis, dyslipidemia, hypertension, end-stage renal disease status post renal transplant x 2, diabetic neuropathy, and PUD.  Patient presents to the emergency department with complaints of shortness of breath and chest pain.  Patient has been admitted for Acute on chronic diastolic heart failure (HCC) [I50.33] Acute on chronic diastolic (congestive) heart failure (HCC) [I50.33] Dyspnea, unspecified type [R06.00]  Patient is known to our practice and is followed outpatient by Dr. Lanora Manis.  Patient was last seen in office on 08/23/2022 for routine follow-up.    Patient seen laying in a dark room No family at bedside States she feels well Room air  Lower extremity edema noted.   Objective:  Vital signs in last 24 hours:  Temp:  [97.9 F (36.6 C)-99.8 F (37.7 C)] 98.7 F (37.1 C) (03/07 0747) Pulse Rate:  [83-111] 111 (03/07 0747) Resp:  [16-18] 16 (03/07 0747) BP: (102-132)/(52-86) 102/52 (03/07 0747) SpO2:  [92 %-100 %] 92 % (03/07 0747)  Weight change:  Filed Weights   09/09/22 1347 09/10/22 0600 09/11/22 0334  Weight: 50.9 kg 52.7 kg 50.4 kg    Intake/Output: I/O last 3 completed shifts: In: 480 [P.O.:480] Out: 600 [Urine:600]   Intake/Output this shift:  Total I/O In: 240 [P.O.:240] Out: -   Physical Exam: General: NAD  Head: Normocephalic, atraumatic. Moist oral mucosal membranes  Eyes: Anicteric  Lungs:  Clear to auscultation, normal effort  Heart: Regular rate and rhythm  Abdomen:  Soft, nontender, nondistended  Extremities: Trace-1+ peripheral edema.  Neurologic: Nonfocal, moving all four extremities  Skin: No lesions  Access: None    Basic Metabolic Panel: Recent Labs  Lab 09/09/22 0556 09/10/22 0533 09/11/22 0507 09/12/22 0628 09/13/22 0653 09/14/22 0340 09/15/22 0527  NA  141   < > 140 141 139 142 143  K 3.7   < > 3.7 4.2 4.0 4.0 4.0  CL 109   < > 108 107 108 109 110  CO2 23   < > '25 24 25 24 24  '$ GLUCOSE 86   < > 88 97 88 83 88  BUN 44*   < > 43* 44* 50* 52* 56*  CREATININE 1.83*   < > 2.24* 2.44* 2.75* 2.75* 2.83*  CALCIUM 7.8*   < > 8.0* 8.0* 8.2* 8.2* 8.3*  MG 1.9  --   --   --   --   --   --    < > = values in this interval not displayed.     Liver Function Tests: No results for input(s): "AST", "ALT", "ALKPHOS", "BILITOT", "PROT", "ALBUMIN" in the last 168 hours.  No results for input(s): "LIPASE", "AMYLASE" in the last 168 hours. No results for input(s): "AMMONIA" in the last 168 hours.  CBC: Recent Labs  Lab 09/10/22 0533 09/11/22 0507 09/12/22 0628 09/13/22 0653 09/14/22 0340  WBC 8.6 8.7 9.5 8.3 7.3  NEUTROABS 6.5 6.4 6.9 6.1 5.4  HGB 10.1* 10.1* 9.9* 10.0* 9.2*  HCT 33.0* 32.3* 31.7* 32.4* 28.8*  MCV 98.5 98.2 97.8 98.2 95.4  PLT 101* 110* 120* 125* 135*     Cardiac Enzymes: No results for input(s): "CKTOTAL", "CKMB", "CKMBINDEX", "TROPONINI" in the last 168 hours.  BNP: Invalid input(s): "POCBNP"  CBG: Recent Labs  Lab 09/14/22 2004 09/15/22 0002 09/15/22 0439 09/15/22 1010 09/15/22 1142  GLUCAP 98 119* 86 113* 148*  Microbiology: Results for orders placed or performed during the hospital encounter of 08/29/22  Surgical pcr screen     Status: None   Collection Time: 08/29/22 11:08 AM   Specimen: Nasal Mucosa; Nasal Swab  Result Value Ref Range Status   MRSA, PCR NEGATIVE NEGATIVE Final   Staphylococcus aureus NEGATIVE NEGATIVE Final    Comment: (NOTE) The Xpert SA Assay (FDA approved for NASAL specimens in patients 70 years of age and older), is one component of a comprehensive surveillance program. It is not intended to diagnose infection nor to guide or monitor treatment. Performed at [facility], [address] [place], [place] [address]     Coagulation Studies: No results for  input(s): "LABPROT", "INR" in the last 72 hours.  Urinalysis: No results for input(s): "COLORURINE", "LABSPEC", "PHURINE", "GLUCOSEU", "HGBUR", "BILIRUBINUR", "KETONESUR", "PROTEINUR", "UROBILINOGEN", "NITRITE", "LEUKOCYTESUR" in the last 72 hours.  Invalid input(s): "APPERANCEUR"     Imaging: No results found.   Medications:      amiodarone  200 mg Oral BID   Followed by   Derrill Memo ON 09/27/2022] amiodarone  200 mg Oral Daily   apixaban  2.5 mg Oral BID   vitamin C  500 mg Oral BID   atorvastatin  10 mg Oral Daily   diltiazem  60 mg Oral Q12H   docusate sodium  100 mg Oral BID   ezetimibe  10 mg Oral Daily   feeding supplement  237 mL Oral TID BM   furosemide  40 mg Oral BID   insulin aspart  0-9 Units Subcutaneous Q4H   multivitamin with minerals  1 tablet Oral Daily   mycophenolate  500 mg Oral BID   mouth rinse  15 mL Mouth Rinse 4 times per day   potassium chloride SA  20 mEq Oral Daily   pregabalin  50 mg Oral Daily   tacrolimus  2 mg Oral BID   zinc sulfate  220 mg Oral Daily   acetaminophen **OR** acetaminophen, iohexol, labetalol, magnesium hydroxide, morphine injection, nitroGLYCERIN, ondansetron **OR** ondansetron (ZOFRAN) IV, mouth rinse, traZODone  Assessment/ Plan:  [name] is a 72 y.o.  female  with past medical conditions including diabetes, atrial fibrillation on Eliquis, dyslipidemia, hypertension, end-stage renal disease status post renal transplant x 2, diabetic neuropathy, and PUD.  Patient presents to the emergency department with complaints of shortness of breath and chest pain.  Patient has been admitted for Acute on chronic diastolic heart failure (HCC) [I50.33] Acute on chronic diastolic (congestive) heart failure (HCC) [I50.33] Dyspnea, unspecified type [R06.00]   Acute Kidney Injury on chronic kidney disease stage IIIAt with baseline creatinine 1.27 and GFR of 45 on 08/18/22.  Acute kidney injury secondary to cardiorenal  syndrome Chronic kidney disease is secondary to diabetes, hypertension and CHF. No exposure to IV contrast. Furosemide drip started in ED.   Creatinine elevated today. Urine output below desired target. Lower extremity edema noted. Will increase diuretic.  Will decrease Tacrolimus to '2mg'$  BID.  Lab Results  Component Value Date   CREATININE 2.83 (H) 09/15/2022   CREATININE 2.75 (H) 09/14/2022   CREATININE 2.75 (H) 09/13/2022    Intake/Output Summary (Last 24 hours) at 09/15/2022 1155 Last data filed at 09/15/2022 0900 Gross per 24 hour  Intake 240 ml  Output --  Net 240 ml    2. Anemia of chronic kidney disease  Lab Results  Component Value Date   HGB 9.2 (L) 09/14/2022    Hemoglobin within desired range.  3. Secondary Hyperparathyroidism: with outpatient labs: PTH 141, phosphorus 3.2, calcium 9.7 on 08/18/22.   Lab Results  Component Value Date   CALCIUM 8.3 (L) 09/15/2022   CAION 1.25 08/30/2022   PHOS 3.7 09/08/2022    Will continue to monitor bone minerals during this admission. 4. Diabetes mellitus type II with chronic kidney disease/renal manifestations: insulin/noninsulin dependent. Home regimen includes Humalog and levemir. Most recent hemoglobin A1c is 5.7 on 04/14/22.   Primary team to manage sliding scale insulin.  5. Acute on Chronic diastolic heart failure. ECHO on 09/08/22 shows EF 60-65% with a grade 1 diastolic dysfunction, mild AVR with stenosis.   Lower extremity edema noted. Will increase Furosemide to '40mg'$  BID.     LOS: 9   3/7/202411:55 AM

## 2022-09-16 ENCOUNTER — Encounter: Payer: Medicare Other | Admitting: Family

## 2022-09-16 DIAGNOSIS — I5033 Acute on chronic diastolic (congestive) heart failure: Secondary | ICD-10-CM | POA: Diagnosis not present

## 2022-09-16 LAB — POTASSIUM: Potassium: 4.2 mmol/L (ref 3.5–5.1)

## 2022-09-16 LAB — PHOSPHORUS: Phosphorus: 4.4 mg/dL (ref 2.5–4.6)

## 2022-09-16 LAB — MAGNESIUM: Magnesium: 1.8 mg/dL (ref 1.7–2.4)

## 2022-09-16 LAB — GLUCOSE, CAPILLARY
Glucose-Capillary: 109 mg/dL — ABNORMAL HIGH (ref 70–99)
Glucose-Capillary: 153 mg/dL — ABNORMAL HIGH (ref 70–99)
Glucose-Capillary: 156 mg/dL — ABNORMAL HIGH (ref 70–99)
Glucose-Capillary: 81 mg/dL (ref 70–99)
Glucose-Capillary: 83 mg/dL (ref 70–99)
Glucose-Capillary: 96 mg/dL (ref 70–99)

## 2022-09-16 NOTE — Progress Notes (Signed)
PT Cancellation Note  Patient Details Name: Jean Johnson MRN: [id number] DOB: 1951/04/15   Cancelled Treatment:    Reason Eval/Treat Not Completed: Patient declined, no reason specified;Fatigue/lethargy limiting ability to participate. Patient sleeping, husband in room. She declines PT at this time. Will re-attempt at later date/ time.    Rain Friedt 09/16/2022, 2:58 PM

## 2022-09-16 NOTE — Progress Notes (Signed)
PROGRESS NOTE    Jean Johnson   [id number] DOB: 11-10-1950  DOA: 09/06/2022 Date of Service: 09/16/22 PCP: Gladstone Lighter, MD     Brief Narrative / Hospital Course:  72 year old African-American female with past medical history significant for ESRD s/p renal transplant x 2 now having CKD stage IIIb, hypertension, dyslipidemia, hypothyroidism, PAF on anticoagulation with Eliquis, PUD, type 2 diabetes mellitus with diabetic neuropathy as well as other comorbidities who presented to the ED with acute worsening of her dyspnea. She has had diminished urine output without dysuria urinary frequency or flank pain.  02/27: BP was 172/91 and heart rate 103. EKG Aflutter w/ variable AV block. Chest x-ray showed cardiomegaly with central pulmonary vascular congestion and small bilateral pleural effusions and patchy opacities in the lung bases that may reflect edema or infection. BUN 38 and creatinine 1.33. Was given 4 baby aspirin, 0.4 mg sublingual nitroglycerin and 20 mg of IV Lasix. VQ low probability PE 02/28: cardiology saw patient - continue IV lasix bid, echo done  02/29: renal fxn worsening, lasix to continuous infusion, d/c lisinopril  03/01: Nephrology consulted, lasix drip increased. Echo with moderate LVH, mild AS, and mild-moderate TR, overall similar to prior from 2023    03/02-03/04: Remain on lasix gtt.  03/04: transition from lasix gtt to po  03/05: remained stable on po meds  03/06: Cardiology s/o. Palliative care [place] discussion. PT/OT to see.  03/07-03/08: PT/OT recs for SNF, TOC following, placement pending.     Consultants:  Cardiology Palliative Care  Nephrology   Procedures: none      ASSESSMENT & PLAN:   Principal Problem:   Acute on chronic diastolic (congestive) heart failure (HCC) Active Problems:   Hypertensive urgency   Chest pain   Elevated d-dimer   Renal transplant recipient   Dyslipidemia   Paroxysmal atrial fibrillation ([place])   Type  2 diabetes mellitus with chronic kidney disease, with long-term current use of insulin (HCC)   Diabetic neuropathy (HCC)   Acute on chronic diastolic heart failure (HCC)   Protein-calorie malnutrition, severe   Acute on chronic diastolic congestive heart failure - stable/baseline  Hypertensive urgency - resolved  Lasix drip to oral Lasix on 09/12/2022 ACE inhibitor on hold at this time in the setting of increasing creatinine, other GDMT limited by renal function  Strict I's and O's, low-salt diet, daily weights Cardiology s/o 03/06 Nephrology following.     Paroxysmal atrial fibrillation with rapid ventricular response, now rate controlled Eliquis 2.5 mg twice daily Cardizem 120 mg twice daily Given worsening renal function flecainide was switched to amiodarone '200mg'$  BID x 14 days, then '200mg'$  daily thereafter    ESRD s/p renal transplant x 2 Chronic immunosuppression Acute on chronic renal failure likely multifactorial in the setting of diuresis as well as hepatorenal syndrome Still makes urine.  Followed by nephrology outpatient. Baseline creatinine 1.29-1.33 Continue immunosuppressive regimen of CellCept and tacrolimus  Nephrology on board we appreciate recommendations   Elevated troponin likely demand ischemia Dyslipidemia Borderline elevation.  Flat trend Not indicative of ACS Continue continue aspirin and statin therapy   Diabetic neuropathy (HCC) Continue with Pregabalin 50 mg p.o. twice daily --> reduced d/t renal function    Type 2 diabetes mellitus with chronic kidney disease, with long-term current use of insulin (HCC) continue insulin therapy Continue to Monitor CBG's per Protocol        DVT prophylaxis: eliquis Pertinent IV fluids/nutrition: no continuous IV fluids, poor po intake but improving  Central  lines / invasive devices: none  Code Status: FULL CODE   Current Admission Status: inpatient   TOC needs / Dispo plan: SNF rehab Barriers to discharge /  significant pending items: SNF placement              Subjective / Brief ROS:  Patient resting comfortably   Family Communication: family at bedside on rounds - I spoke w/ her husband and answered all questions     Objective Findings:  Vitals:   09/15/22 1950 09/16/22 0445 09/16/22 0802 09/16/22 1608  BP: 126/67 (!) 132/56 (!) 144/69 126/62  Pulse: 86 77 82 82  Resp: '18 18 18 16  '$ Temp: 98.2 F (36.8 C) 99.7 F (37.6 C) 98.3 F (36.8 C) 98.5 F (36.9 C)  TempSrc: Oral Oral Oral Oral  SpO2: 100% 97% 93% 100%  Weight:      Height:        Intake/Output Summary (Last 24 hours) at 09/16/2022 1848 Last data filed at 09/16/2022 1810 Gross per 24 hour  Intake 240 ml  Output 400 ml  Net -160 ml   Filed Weights   09/09/22 1347 09/10/22 0600 09/11/22 0334  Weight: 50.9 kg 52.7 kg 50.4 kg    Examination:  Physical Exam Constitutional:      General: She is not in acute distress.    Appearance: She is not toxic-appearing.  Pulmonary:     Effort: Pulmonary effort is normal. No tachypnea.  Musculoskeletal:     Right lower leg: No edema.     Left lower leg: No edema.  Skin:    General: Skin is warm and dry.          Scheduled Medications:   amiodarone  200 mg Oral BID   Followed by   Derrill Memo ON 09/27/2022] amiodarone  200 mg Oral Daily   apixaban  2.5 mg Oral BID   vitamin C  500 mg Oral BID   atorvastatin  10 mg Oral Daily   diltiazem  60 mg Oral Q12H   docusate sodium  100 mg Oral BID   ezetimibe  10 mg Oral Daily   feeding supplement  237 mL Oral TID BM   furosemide  40 mg Oral BID   insulin aspart  0-9 Units Subcutaneous Q4H   multivitamin with minerals  1 tablet Oral Daily   mycophenolate  500 mg Oral BID   mouth rinse  15 mL Mouth Rinse 4 times per day   potassium chloride SA  20 mEq Oral Daily   pregabalin  50 mg Oral Daily   tacrolimus  2 mg Oral BID   zinc sulfate  220 mg Oral Daily    Continuous Infusions:   PRN Medications:   acetaminophen **OR** acetaminophen, iohexol, labetalol, morphine injection, nitroGLYCERIN, ondansetron **OR** ondansetron (ZOFRAN) IV, mouth rinse, traZODone  Antimicrobials from admission:  Anti-infectives (From admission, onward)    None           Data Reviewed:  I have personally reviewed the following...  CBC: Recent Labs  Lab 09/10/22 0533 09/11/22 0507 09/12/22 0628 09/13/22 0653 09/14/22 0340  WBC 8.6 8.7 9.5 8.3 7.3  NEUTROABS 6.5 6.4 6.9 6.1 5.4  HGB 10.1* 10.1* 9.9* 10.0* 9.2*  HCT 33.0* 32.3* 31.7* 32.4* 28.8*  MCV 98.5 98.2 97.8 98.2 95.4  PLT 101* 110* 120* 125* [id number]*   Basic Metabolic Panel: Recent Labs  Lab 09/11/22 0507 09/12/22 0628 09/13/22 0653 09/14/22 0340 09/15/22 0527 09/16/22 0516  NA 140 141 139  142 143  --   K 3.7 4.2 4.0 4.0 4.0 4.2  CL 108 107 108 109 110  --   CO2 '25 24 25 24 24  '$ --   GLUCOSE 88 97 88 83 88  --   BUN 43* 44* 50* 52* 56*  --   CREATININE 2.24* 2.44* 2.75* 2.75* 2.83*  --   CALCIUM 8.0* 8.0* 8.2* 8.2* 8.3*  --   MG  --   --   --   --   --  1.8  PHOS  --   --   --   --   --  4.4   GFR: Estimated Creatinine Clearance: 14 mL/min (A) (by C-G formula based on SCr of 2.83 mg/dL (H)). Liver Function Tests: No results for input(s): "AST", "ALT", "ALKPHOS", "BILITOT", "PROT", "ALBUMIN" in the last 168 hours.  No results for input(s): "LIPASE", "AMYLASE" in the last 168 hours. No results for input(s): "AMMONIA" in the last 168 hours. Coagulation Profile: No results for input(s): "INR", "PROTIME" in the last 168 hours. Cardiac Enzymes: No results for input(s): "CKTOTAL", "CKMB", "CKMBINDEX", "TROPONINI" in the last 168 hours. BNP (last 3 results) No results for input(s): "PROBNP" in the last 8760 hours. HbA1C: No results for input(s): "HGBA1C" in the last 72 hours. CBG: Recent Labs  Lab 09/15/22 2322 09/16/22 0449 09/16/22 0811 09/16/22 1155 09/16/22 1633  GLUCAP 128* 109* 96 156* 153*   Lipid Profile: No  results for input(s): "CHOL", "HDL", "LDLCALC", "TRIG", "CHOLHDL", "LDLDIRECT" in the last 72 hours. Thyroid Function Tests: No results for input(s): "TSH", "T4TOTAL", "FREET4", "T3FREE", "THYROIDAB" in the last 72 hours. Anemia Panel: No results for input(s): "VITAMINB12", "FOLATE", "FERRITIN", "TIBC", "IRON", "RETICCTPCT" in the last 72 hours. Most Recent Urinalysis On File:     Component Value Date/Time   COLORURINE STRAW (A) 09/06/2022 2353   APPEARANCEUR CLEAR (A) 09/06/2022 2353   APPEARANCEUR Hazy (A) 11/16/2020 1344   LABSPEC 1.007 09/06/2022 2353   PHURINE 5.0 09/06/2022 2353   GLUCOSEU NEGATIVE 09/06/2022 2353   HGBUR MODERATE (A) 09/06/2022 2353   [place] NEGATIVE 09/06/2022 2353   BILIRUBINUR Negative 11/16/2020 1344   [place] 09/06/2022 2353   PROTEINUR >=300 (A) 09/06/2022 2353   NITRITE NEGATIVE 09/06/2022 2353   LEUKOCYTESUR NEGATIVE 09/06/2022 2353   Sepsis Labs: '@LABRCNTIP'$ (procalcitonin:4,lacticidven:4) Microbiology: No results found for this or any previous visit (from the past 240 hour(s)).    Radiology Studies last 3 days: No results found.           LOS: 10 days       Emeterio Reeve, DO [facility] 09/16/2022, 6:48 PM    Dictation software may have been used to generate the above note. Typos may occur and escape review in typed/dictated notes. Please contact Dr Sheppard Coil directly for clarity if needed.  Staff may message me via secure chat in [place]  but this may not receive an immediate response,  please page me for urgent matters!  If 7PM-7AM, please contact night coverage [email or web address]

## 2022-09-16 NOTE — Progress Notes (Signed)
[facility]  ROUNDING NOTE   Subjective:   Jean Johnson is a 72 y.o. female with past medical conditions including diabetes, atrial fibrillation on Eliquis, dyslipidemia, hypertension, end-stage renal disease status post renal transplant x 2, diabetic neuropathy, and PUD.  Patient presents to the emergency department with complaints of shortness of breath and chest pain.  Patient has been admitted for Acute on chronic diastolic heart failure (HCC) [I50.33] Acute on chronic diastolic (congestive) heart failure (HCC) [I50.33] Dyspnea, unspecified type [R06.00]  Patient is known to our practice and is followed outpatient by Dr. Lanora Manis.  Patient was last seen in office on 08/23/2022 for routine follow-up.    Patient resting quietly in bed Alert and oriented Remains on room air No complaints to offer Lower extremity edema mildly improved.    Objective:  Vital signs in last 24 hours:  Temp:  [98.1 F (36.7 C)-99.7 F (37.6 C)] 98.3 F (36.8 C) (03/08 0802) Pulse Rate:  [69-86] 82 (03/08 0802) Resp:  [16-18] 18 (03/08 0802) BP: (106-144)/(56-69) 144/69 (03/08 0802) SpO2:  [93 %-100 %] 93 % (03/08 0802)  Weight change:  Filed Weights   09/09/22 1347 09/10/22 0600 09/11/22 0334  Weight: 50.9 kg 52.7 kg 50.4 kg    Intake/Output: I/O last 3 completed shifts: In: 660 [P.O.:660] Out: -    Intake/Output this shift:  No intake/output data recorded.  Physical Exam: General: NAD  Head: Normocephalic, atraumatic. Moist oral mucosal membranes  Eyes: Anicteric  Lungs:  Clear to auscultation, normal effort  Heart: Regular rate and rhythm  Abdomen:  Soft, nontender, nondistended  Extremities: Trace-1+ peripheral edema.  Neurologic: Nonfocal, moving all four extremities  Skin: No lesions  Access: None    Basic Metabolic Panel: Recent Labs  Lab 09/11/22 0507 09/12/22 0628 09/13/22 0653 09/14/22 0340 09/15/22 0527 09/16/22 0516  NA 140 141 139 142 143   --   K 3.7 4.2 4.0 4.0 4.0 4.2  CL 108 107 108 109 110  --   CO2 '25 24 25 24 24  '$ --   GLUCOSE 88 97 88 83 88  --   BUN 43* 44* 50* 52* 56*  --   CREATININE 2.24* 2.44* 2.75* 2.75* 2.83*  --   CALCIUM 8.0* 8.0* 8.2* 8.2* 8.3*  --   MG  --   --   --   --   --  1.8  PHOS  --   --   --   --   --  4.4     Liver Function Tests: No results for input(s): "AST", "ALT", "ALKPHOS", "BILITOT", "PROT", "ALBUMIN" in the last 168 hours.  No results for input(s): "LIPASE", "AMYLASE" in the last 168 hours. No results for input(s): "AMMONIA" in the last 168 hours.  CBC: Recent Labs  Lab 09/10/22 0533 09/11/22 0507 09/12/22 0628 09/13/22 0653 09/14/22 0340  WBC 8.6 8.7 9.5 8.3 7.3  NEUTROABS 6.5 6.4 6.9 6.1 5.4  HGB 10.1* 10.1* 9.9* 10.0* 9.2*  HCT 33.0* 32.3* 31.7* 32.4* 28.8*  MCV 98.5 98.2 97.8 98.2 95.4  PLT 101* 110* 120* 125* 135*     Cardiac Enzymes: No results for input(s): "CKTOTAL", "CKMB", "CKMBINDEX", "TROPONINI" in the last 168 hours.  BNP: Invalid input(s): "POCBNP"  CBG: Recent Labs  Lab 09/15/22 1949 09/15/22 2322 09/16/22 0449 09/16/22 0811 09/16/22 1155  GLUCAP 104* 128* 109* 96 156*     Microbiology: Results for orders placed or performed during the hospital encounter of 08/29/22  Surgical pcr  screen     Status: None   Collection Time: 08/29/22 11:08 AM   Specimen: Nasal Mucosa; Nasal Swab  Result Value Ref Range Status   MRSA, PCR NEGATIVE NEGATIVE Final   Staphylococcus aureus NEGATIVE NEGATIVE Final    Comment: (NOTE) The Xpert SA Assay (FDA approved for NASAL specimens in patients 71 years of age and older), is one component of a comprehensive surveillance program. It is not intended to diagnose infection nor to guide or monitor treatment. Performed at [facility], [address] [place], [place] [address]     Coagulation Studies: No results for input(s): "LABPROT", "INR" in the last 72 hours.  Urinalysis: No results for  input(s): "COLORURINE", "LABSPEC", "PHURINE", "GLUCOSEU", "HGBUR", "BILIRUBINUR", "KETONESUR", "PROTEINUR", "UROBILINOGEN", "NITRITE", "LEUKOCYTESUR" in the last 72 hours.  Invalid input(s): "APPERANCEUR"     Imaging: No results found.   Medications:      amiodarone  200 mg Oral BID   Followed by   Derrill Memo ON 09/27/2022] amiodarone  200 mg Oral Daily   apixaban  2.5 mg Oral BID   vitamin C  500 mg Oral BID   atorvastatin  10 mg Oral Daily   diltiazem  60 mg Oral Q12H   docusate sodium  100 mg Oral BID   ezetimibe  10 mg Oral Daily   feeding supplement  237 mL Oral TID BM   furosemide  40 mg Oral BID   insulin aspart  0-9 Units Subcutaneous Q4H   multivitamin with minerals  1 tablet Oral Daily   mycophenolate  500 mg Oral BID   mouth rinse  15 mL Mouth Rinse 4 times per day   potassium chloride SA  20 mEq Oral Daily   pregabalin  50 mg Oral Daily   tacrolimus  2 mg Oral BID   zinc sulfate  220 mg Oral Daily   acetaminophen **OR** acetaminophen, iohexol, labetalol, magnesium hydroxide, morphine injection, nitroGLYCERIN, ondansetron **OR** ondansetron (ZOFRAN) IV, mouth rinse, traZODone  Assessment/ Plan:  [name] is a 72 y.o.  female  with past medical conditions including diabetes, atrial fibrillation on Eliquis, dyslipidemia, hypertension, end-stage renal disease status post renal transplant x 2, diabetic neuropathy, and PUD.  Patient presents to the emergency department with complaints of shortness of breath and chest pain.  Patient has been admitted for Acute on chronic diastolic heart failure (HCC) [I50.33] Acute on chronic diastolic (congestive) heart failure (HCC) [I50.33] Dyspnea, unspecified type [R06.00]   Acute Kidney Injury on chronic kidney disease stage IIIAt with baseline creatinine 1.27 and GFR of 45 on 08/18/22.  Acute kidney injury secondary to cardiorenal syndrome Chronic kidney disease is secondary to diabetes, hypertension and CHF. No  exposure to IV contrast. Furosemide drip started in ED.   Creatinine continues to rise slowly on oral medications. Diuretic increased on 09/15/22 due to worsening edema. Will continue to monitor.   Continue Tacrolimus to '2mg'$  BID.  Lab Results  Component Value Date   CREATININE 2.83 (H) 09/15/2022   CREATININE 2.75 (H) 09/14/2022   CREATININE 2.75 (H) 09/13/2022    Intake/Output Summary (Last 24 hours) at 09/16/2022 1238 Last data filed at 09/15/2022 1911 Gross per 24 hour  Intake 420 ml  Output --  Net 420 ml    2. Anemia of chronic kidney disease  Lab Results  Component Value Date   HGB 9.2 (L) 09/14/2022    Hemoglobin within desired range.   3. Secondary Hyperparathyroidism: with outpatient labs: PTH 141, phosphorus  3.2, calcium 9.7 on 08/18/22.   Lab Results  Component Value Date   CALCIUM 8.3 (L) 09/15/2022   CAION 1.25 08/30/2022   PHOS 4.4 09/16/2022    Will continue to monitor bone minerals during this admission. 4. Diabetes mellitus type II with chronic kidney disease/renal manifestations: insulin/noninsulin dependent. Home regimen includes Humalog and levemir. Most recent hemoglobin A1c is 5.7 on 04/14/22.   Well controlled. Primary team to manage sliding scale insulin.  5. Acute on Chronic diastolic heart failure. ECHO on 09/08/22 shows EF 60-65% with a grade 1 diastolic dysfunction, mild AVR with stenosis.   Cotninue Furosemide to '40mg'$  BID.     LOS: 10   3/8/202412:38 PM

## 2022-09-16 NOTE — Plan of Care (Signed)
Notes and labs reviewed. Chaplain has assisted with AD. Working towards D/C. PMT will sign off, please reconsult for needs.

## 2022-09-16 NOTE — TOC Progression Note (Addendum)
Transition of Care [facility]) - Progression Note    Patient Details  Name: Jean Johnson MRN: [id number] Date of Birth: 1950/12/02  Transition of Care [facility]) CM/SW [place], LCSW Phone Number: 09/16/2022, 11:45 AM  Clinical Narrative:   Called sister-in-law. She is driving around to see local SNF's. Provided current bed offer update. Sister-in-law stated that plan is for long-term placement after rehab. She agreed to [facility] referral to assist with this. Made referral to [facility].  3:59 pm: Left voicemail for sister-in-law to provide update on bed offers.  4:18 pm: Received call back from sister-in-law. She has a meeting with [employer] on Tuesday at 1:00. CSW asked her to review bed offers with patient so we can notify SNF. Will ask weekend TOC to follow up with them.  Expected Discharge Plan: [place] Barriers to Discharge: Continued Medical Work up  Expected Discharge Plan and Services       Living arrangements for the past 2 months: [place]: [place] ([place]) Date [place]: 09/08/22 Time [place]: [place] Representative spoke with at [place]: [place] ([place]) Interventions SDOH Screenings   Food Insecurity: No Food Insecurity (09/07/2022)  Housing: Low Risk  (09/07/2022)  Transportation Needs: No Transportation Needs (09/07/2022)  Utilities: Not At Risk (09/07/2022)  Tobacco Use: Low Risk  (09/10/2022)    Readmission Risk Interventions    09/08/2022    4:23 PM 04/27/2022    3:34 PM  Readmission Risk Prevention Plan  Transportation Screening Complete Complete  HRI or Home Care Consult  Complete  Social Work Consult for [place] Planning/Counseling  Complete  Palliative Care Screening  Not Applicable  Medication Review Press photographer) Complete Complete  PCP or Specialist appointment within 3-5 days of  discharge Complete   HRI or [place] Complete   SW Recovery Care/Counseling Consult Complete   Palliative Care Screening Not Complete   Skilled Nursing Facility Complete

## 2022-09-16 NOTE — Progress Notes (Signed)
Chap visited the Pt and Family at bedside responding to a Spiritual Consult. Pt requested help with the Adv Dir. Chap facilitated the process and now the Pt has Adv Dir on file and their own copy.    09/16/22 1000  Spiritual Encounters  Type of Visit Initial  Care provided to: Pt and family  Conversation partners present during encounter Nurse  Referral source Family  Reason for visit Advance directives  OnCall Visit Yes  Spiritual Framework  Presenting Themes Goals in life/care  Interventions  Spiritual Care Interventions Made Established relationship of care and support;Mindfulness intervention;Decision-making support/facilitation  Intervention Outcomes  Outcomes Connection to spiritual care;Awareness of support  Spiritual Care Plan  Spiritual Care Issues Still Outstanding No further spiritual care needs at this time (see row info)  Advance Directives (For Healthcare)  Does Patient Have a Medical Advance Directive? Yes

## 2022-09-17 DIAGNOSIS — I5033 Acute on chronic diastolic (congestive) heart failure: Secondary | ICD-10-CM | POA: Diagnosis not present

## 2022-09-17 DIAGNOSIS — Z7189 Other specified counseling: Secondary | ICD-10-CM | POA: Diagnosis not present

## 2022-09-17 LAB — GLUCOSE, CAPILLARY
Glucose-Capillary: 86 mg/dL (ref 70–99)
Glucose-Capillary: 100 mg/dL — ABNORMAL HIGH (ref 70–99)
Glucose-Capillary: 104 mg/dL — ABNORMAL HIGH (ref 70–99)
Glucose-Capillary: 118 mg/dL — ABNORMAL HIGH (ref 70–99)
Glucose-Capillary: 132 mg/dL — ABNORMAL HIGH (ref 70–99)

## 2022-09-17 NOTE — Plan of Care (Signed)

## 2022-09-17 NOTE — Progress Notes (Signed)
[facility]  PROGRESS NOTE   Subjective:   Patient is awake and alert.  Appetite has been poor. She was admitted for congestive heart failure.  Breathing has now improved.  Objective:  Vital signs: Blood pressure (!) 145/64, pulse 95, temperature 98.3 F (36.8 C), temperature source Oral, resp. rate 18, height '5\' 2"'$  (1.575 m), weight 50.4 kg, SpO2 100 %.  Intake/Output Summary (Last 24 hours) at 09/17/2022 1520 Last data filed at 09/17/2022 1418 Gross per 24 hour  Intake 240 ml  Output 400 ml  Net -160 ml   Filed Weights   09/10/22 0600 09/11/22 0334  Weight: 52.7 kg 50.4 kg     Physical Exam: General:  No acute distress  Head:  Normocephalic, atraumatic. Moist oral mucosal membranes  Eyes:  Anicteric  Neck:  Supple  Lungs:   Clear to auscultation, normal effort  Heart:  S1S2 no rubs  Abdomen:   Soft, nontender, bowel sounds present  Extremities:  peripheral edema.  Neurologic:  Awake, alert, following commands  Skin:  No lesions  Access:     Basic Metabolic Panel: Recent Labs  Lab 09/11/22 0507 09/12/22 0628 09/13/22 0653 09/14/22 0340 09/15/22 0527 09/16/22 0516  NA 140 141 139 142 143  --   K 3.7 4.2 4.0 4.0 4.0 4.2  CL 108 107 108 109 110  --   CO2 '25 24 25 24 24  '$ --   GLUCOSE 88 97 88 83 88  --   BUN 43* 44* 50* 52* 56*  --   CREATININE 2.24* 2.44* 2.75* 2.75* 2.83*  --   CALCIUM 8.0* 8.0* 8.2* 8.2* 8.3*  --   MG  --   --   --   --   --  1.8  PHOS  --   --   --   --   --  4.4   GFR: Estimated Creatinine Clearance: 14 mL/min (A) (by C-G formula based on SCr of 2.83 mg/dL (H)).  Liver Function Tests: No results for input(s): "AST", "ALT", "ALKPHOS", "BILITOT", "PROT", "ALBUMIN" in the last 168 hours. No results for input(s): "LIPASE", "AMYLASE" in the last 168 hours. No results for input(s): "AMMONIA" in the last 168 hours.  CBC: Recent Labs  Lab 09/11/22 0507 09/12/22 0628 09/13/22 0653 09/14/22 0340  WBC 8.7 9.5 8.3 7.3   NEUTROABS 6.4 6.9 6.1 5.4  HGB 10.1* 9.9* 10.0* 9.2*  HCT 32.3* 31.7* 32.4* 28.8*  MCV 98.2 97.8 98.2 95.4  PLT 110* 120* 125* 135*     HbA1C: Hgb A1c MFr Bld  Date/Time Value Ref Range Status  04/14/2022 08:32 AM 5.7 (H) 4.8 - 5.6 % Final    Comment:    (NOTE) Pre diabetes:          5.7%-6.4%  Diabetes:              >6.4%  Glycemic control for   <7.0% adults with diabetes   03/25/2021 02:43 AM 5.6 4.8 - 5.6 % Final    Comment:    (NOTE) Pre diabetes:          5.7%-6.4%  Diabetes:              >6.4%  Glycemic control for   <7.0% adults with diabetes     Urinalysis: No results for input(s): "COLORURINE", "LABSPEC", "PHURINE", "GLUCOSEU", "HGBUR", "BILIRUBINUR", "KETONESUR", "PROTEINUR", "UROBILINOGEN", "NITRITE", "LEUKOCYTESUR" in the last 72 hours.  Invalid input(s): "APPERANCEUR"    Imaging: No results found.   Medications:  amiodarone  200 mg Oral BID   Followed by   Derrill Memo ON 09/27/2022] amiodarone  200 mg Oral Daily   apixaban  2.5 mg Oral BID   vitamin C  500 mg Oral BID   atorvastatin  10 mg Oral Daily   diltiazem  60 mg Oral Q12H   docusate sodium  100 mg Oral BID   ezetimibe  10 mg Oral Daily   feeding supplement  237 mL Oral TID BM   furosemide  40 mg Oral BID   insulin aspart  0-9 Units Subcutaneous Q4H   multivitamin with minerals  1 tablet Oral Daily   mycophenolate  500 mg Oral BID   mouth rinse  15 mL Mouth Rinse 4 times per day   potassium chloride SA  20 mEq Oral Daily   pregabalin  50 mg Oral Daily   tacrolimus  2 mg Oral BID   zinc sulfate  220 mg Oral Daily    Assessment/ Plan:     72 y.o.  female  with past medical conditions including diabetes, atrial fibrillation on Eliquis, dyslipidemia, hypertension, end-stage renal disease status post renal transplant x 2, diabetic neuropathy, and PUD.  Patient presents to the emergency department with complaints of shortness of breath and chest pain.  Patient has been admitted for  Acute on chronic diastolic heart failure (HCC) [I50.33] Acute on chronic diastolic (congestive) heart failure (HCC) [I50.33] Dyspnea, unspecified type [R06.00]  #1: Acute kidney injury on chronic kidney disease: Patient is CKD stage IIIb.  Presently worsening renal indicis secondary to IV diuresis.  She may also have cardiorenal syndrome.  She apparently on furosemide 40 mg twice a day.  #2: Renal transplant status: S/p cadaveric renal transplantation twice.  She has been on mycophenolate and tacrolimus.  Will continue the tacrolimus to 2 mg twice a day along with mycophenolate.  #3: Anemia: Will continue to monitor closely.  #4: Congestive heart failure: She is advised to stay on 2 g salt restricted diet along with 1000 cc fluid restriction.  Will continue the furosemide 40 mg twice a day for now.  Echocardiogram shows diastolic dysfunction.  #5: Diabetes: Diabetes well-controlled.  Will continue the insulin protocol.    LOS: [place] 3/9/20243:20 PM

## 2022-09-17 NOTE — Progress Notes (Signed)
PROGRESS NOTE    Jean Johnson   [id number] DOB: [birth year]  DOA: 09/06/2022 Date of Service: 09/17/22 PCP: Gladstone Lighter, MD     Brief Narrative / Hospital Course:  72 year old African-American female with past medical history significant for ESRD s/p renal transplant x 2 now having CKD stage IIIb, hypertension, dyslipidemia, hypothyroidism, PAF on anticoagulation with Eliquis, PUD, type 2 diabetes mellitus with diabetic neuropathy as well as other comorbidities who presented to the ED with acute worsening of her dyspnea. She has had diminished urine output without dysuria urinary frequency or flank pain.  02/27: BP was 172/91 and heart rate 103. EKG Aflutter w/ variable AV block. Chest x-ray showed cardiomegaly with central pulmonary vascular congestion and small bilateral pleural effusions and patchy opacities in the lung bases that may reflect edema or infection. BUN 38 and creatinine 1.33. Was given 4 baby aspirin, 0.4 mg sublingual nitroglycerin and 20 mg of IV Lasix. VQ low probability PE 02/28: cardiology saw patient - continue IV lasix bid, echo done  02/29: renal fxn worsening, lasix to continuous infusion, d/c lisinopril  03/01: Nephrology consulted, lasix drip increased. Echo with moderate LVH, mild AS, and mild-moderate TR, overall similar to prior from 2023    03/02-03/04: Remain on lasix gtt.  03/04: transition from lasix gtt to po  03/05: remained stable on po meds  03/06: Cardiology s/o. Palliative care [place] discussion. PT/OT to see.  03/07-03/09: PT/OT recs for SNF, TOC following, placement pending thru weekend.     Consultants:  Cardiology Palliative Care  Nephrology   Procedures: none      ASSESSMENT & PLAN:   Principal Problem:   Acute on chronic diastolic (congestive) heart failure (HCC) Active Problems:   Hypertensive urgency   Chest pain   Elevated d-dimer   Renal transplant recipient   Dyslipidemia   Paroxysmal atrial fibrillation  ([place])   Type 2 diabetes mellitus with chronic kidney disease, with long-term current use of insulin (HCC)   Diabetic neuropathy (HCC)   Acute on chronic diastolic heart failure (HCC)   Protein-calorie malnutrition, severe   Acute on chronic diastolic congestive heart failure - stable/baseline  Hypertensive urgency - resolved  Lasix drip to oral Lasix on 09/12/2022 ACE inhibitor on hold at this time in the setting of increasing creatinine, other GDMT limited by renal function  Strict I's and O's, low-salt diet, daily weights Cardiology s/o 03/06 Nephrology following.     Paroxysmal atrial fibrillation with rapid ventricular response, now rate controlled Eliquis 2.5 mg twice daily Cardizem 120 mg twice daily Given worsening renal function flecainide was switched to amiodarone '200mg'$  BID x 14 days, then '200mg'$  daily thereafter    ESRD s/p renal transplant x 2 Chronic immunosuppression Acute on chronic renal failure likely multifactorial in the setting of diuresis as well as hepatorenal syndrome Still makes urine.  Followed by nephrology outpatient. Baseline creatinine 1.29-1.33 Continue immunosuppressive regimen of CellCept and tacrolimus  Nephrology on board we appreciate recommendations   Elevated troponin likely demand ischemia Dyslipidemia Borderline elevation.  Flat trend Not indicative of ACS Continue continue aspirin and statin therapy   Diabetic neuropathy (HCC) Continue with Pregabalin 50 mg p.o. twice daily --> reduced d/t renal function    Type 2 diabetes mellitus with chronic kidney disease, with long-term current use of insulin (HCC) continue insulin therapy Continue to Monitor CBG's per Protocol        DVT prophylaxis: eliquis Pertinent IV fluids/nutrition: no continuous IV fluids, poor po intake but improving  Central lines / invasive devices: none  Code Status: FULL CODE   Current Admission Status: inpatient   TOC needs / Dispo plan: SNF rehab Barriers to  discharge / significant pending items: SNF placement              Subjective / Brief ROS:  Patient resting comfortably   Family Communication: family at bedside on rounds - I spoke w/ her husband and answered all questions     Objective Findings:  Vitals:   09/16/22 1608 09/16/22 1951 09/17/22 0505 09/17/22 0758  BP: 126/62 129/69 (!) 149/72 (!) 145/64  Pulse: 82 85 89 95  Resp: 16 20 (!) 21 18  Temp: 98.5 F (36.9 C) 99.2 F (37.3 C) 98.3 F (36.8 C) 98.3 F (36.8 C)  TempSrc: Oral Oral Oral Oral  SpO2: 100% 100% 100% 100%  Weight:      Height:        Intake/Output Summary (Last 24 hours) at 09/17/2022 1250 Last data filed at 09/16/2022 1810 Gross per 24 hour  Intake --  Output 400 ml  Net -400 ml   Filed Weights   09/10/22 0600 09/11/22 0334  Weight: 52.7 kg 50.4 kg    Examination:  Physical Exam Constitutional:      General: She is not in acute distress.    Appearance: She is not toxic-appearing.  Pulmonary:     Effort: Pulmonary effort is normal. No tachypnea.     Breath sounds: Examination of the right-lower field reveals decreased breath sounds. Examination of the left-lower field reveals decreased breath sounds. Decreased breath sounds present.  Musculoskeletal:     Right lower leg: No edema.     Left lower leg: No edema.  Skin:    General: Skin is warm and dry.  Neurological:     Mental Status: She is alert. Mental status is at baseline.          Scheduled Medications:   amiodarone  200 mg Oral BID   Followed by   Derrill Memo ON 09/27/2022] amiodarone  200 mg Oral Daily   apixaban  2.5 mg Oral BID   vitamin C  500 mg Oral BID   atorvastatin  10 mg Oral Daily   diltiazem  60 mg Oral Q12H   docusate sodium  100 mg Oral BID   ezetimibe  10 mg Oral Daily   feeding supplement  237 mL Oral TID BM   furosemide  40 mg Oral BID   insulin aspart  0-9 Units Subcutaneous Q4H   multivitamin with minerals  1 tablet Oral Daily   mycophenolate   500 mg Oral BID   mouth rinse  15 mL Mouth Rinse 4 times per day   potassium chloride SA  20 mEq Oral Daily   pregabalin  50 mg Oral Daily   tacrolimus  2 mg Oral BID   zinc sulfate  220 mg Oral Daily    Continuous Infusions:   PRN Medications:  acetaminophen **OR** acetaminophen, iohexol, labetalol, morphine injection, nitroGLYCERIN, ondansetron **OR** ondansetron (ZOFRAN) IV, mouth rinse, traZODone  Antimicrobials from admission:  Anti-infectives (From admission, onward)    None           Data Reviewed:  I have personally reviewed the following...  CBC: Recent Labs  Lab 09/11/22 0507 09/12/22 0628 09/13/22 0653 09/14/22 0340  WBC 8.7 9.5 8.3 7.3  NEUTROABS 6.4 6.9 6.1 5.4  HGB 10.1* 9.9* 10.0* 9.2*  HCT 32.3* 31.7* 32.4* 28.8*  MCV 98.2 97.8 98.2  95.4  PLT 110* 120* 125* [id number]*   Basic Metabolic Panel: Recent Labs  Lab 09/11/22 0507 09/12/22 0628 09/13/22 0653 09/14/22 0340 09/15/22 0527 09/16/22 0516  NA 140 141 139 142 143  --   K 3.7 4.2 4.0 4.0 4.0 4.2  CL 108 107 108 109 110  --   CO2 '25 24 25 24 24  '$ --   GLUCOSE 88 97 88 83 88  --   BUN 43* 44* 50* 52* 56*  --   CREATININE 2.24* 2.44* 2.75* 2.75* 2.83*  --   CALCIUM 8.0* 8.0* 8.2* 8.2* 8.3*  --   MG  --   --   --   --   --  1.8  PHOS  --   --   --   --   --  4.4   GFR: Estimated Creatinine Clearance: 14 mL/min (A) (by C-G formula based on SCr of 2.83 mg/dL (H)). Liver Function Tests: No results for input(s): "AST", "ALT", "ALKPHOS", "BILITOT", "PROT", "ALBUMIN" in the last 168 hours.  No results for input(s): "LIPASE", "AMYLASE" in the last 168 hours. No results for input(s): "AMMONIA" in the last 168 hours. Coagulation Profile: No results for input(s): "INR", "PROTIME" in the last 168 hours. Cardiac Enzymes: No results for input(s): "CKTOTAL", "CKMB", "CKMBINDEX", "TROPONINI" in the last 168 hours. BNP (last 3 results) No results for input(s): "PROBNP" in the last 8760  hours. HbA1C: No results for input(s): "HGBA1C" in the last 72 hours. CBG: Recent Labs  Lab 09/16/22 1633 09/16/22 1950 09/16/22 2330 09/17/22 0600 09/17/22 0906  GLUCAP 153* 81 83 86 100*   Lipid Profile: No results for input(s): "CHOL", "HDL", "LDLCALC", "TRIG", "CHOLHDL", "LDLDIRECT" in the last 72 hours. Thyroid Function Tests: No results for input(s): "TSH", "T4TOTAL", "FREET4", "T3FREE", "THYROIDAB" in the last 72 hours. Anemia Panel: No results for input(s): "VITAMINB12", "FOLATE", "FERRITIN", "TIBC", "IRON", "RETICCTPCT" in the last 72 hours. Most Recent Urinalysis On File:     Component Value Date/Time   COLORURINE STRAW (A) 09/06/2022 2353   APPEARANCEUR CLEAR (A) 09/06/2022 2353   APPEARANCEUR Hazy (A) 11/16/2020 1344   LABSPEC 1.007 09/06/2022 2353   PHURINE 5.0 09/06/2022 2353   GLUCOSEU NEGATIVE 09/06/2022 2353   HGBUR MODERATE (A) 09/06/2022 2353   [place] NEGATIVE 09/06/2022 2353   BILIRUBINUR Negative 11/16/2020 1344   [place] 09/06/2022 2353   PROTEINUR >=300 (A) 09/06/2022 2353   NITRITE NEGATIVE 09/06/2022 2353   LEUKOCYTESUR NEGATIVE 09/06/2022 2353   Sepsis Labs: '@LABRCNTIP'$ (procalcitonin:4,lacticidven:4) Microbiology: No results found for this or any previous visit (from the past 240 hour(s)).    Radiology Studies last 3 days: No results found.           LOS: 11 days       Emeterio Reeve, DO [facility] 09/17/2022, 12:50 PM    Dictation software may have been used to generate the above note. Typos may occur and escape review in typed/dictated notes. Please contact Dr Sheppard Coil directly for clarity if needed.  Staff may message me via secure chat in [place]  but this may not receive an immediate response,  please page me for urgent matters!  If 7PM-7AM, please contact night coverage [email or web address]

## 2022-09-18 DIAGNOSIS — I5033 Acute on chronic diastolic (congestive) heart failure: Secondary | ICD-10-CM | POA: Diagnosis not present

## 2022-09-18 DIAGNOSIS — Z7189 Other specified counseling: Secondary | ICD-10-CM | POA: Diagnosis not present

## 2022-09-18 LAB — CBC
HCT: 30.6 % — ABNORMAL LOW (ref 36.0–46.0)
Hemoglobin: 9.3 g/dL — ABNORMAL LOW (ref 12.0–15.0)
MCH: 30.7 pg (ref 26.0–34.0)
MCHC: 30.4 g/dL (ref 30.0–36.0)
MCV: 101 fL — ABNORMAL HIGH (ref 80.0–100.0)
Platelets: 165 10*3/uL (ref 150–400)
RBC: 3.03 MIL/uL — ABNORMAL LOW (ref 3.87–5.11)
RDW: 16.8 % — ABNORMAL HIGH (ref 11.5–15.5)
WBC: 7.5 10*3/uL (ref 4.0–10.5)
nRBC: 0 % (ref 0.0–0.2)

## 2022-09-18 LAB — PHOSPHORUS: Phosphorus: 4.6 mg/dL (ref 2.5–4.6)

## 2022-09-18 LAB — GLUCOSE, CAPILLARY
Glucose-Capillary: 122 mg/dL — ABNORMAL HIGH (ref 70–99)
Glucose-Capillary: 160 mg/dL — ABNORMAL HIGH (ref 70–99)
Glucose-Capillary: 50 mg/dL — ABNORMAL LOW (ref 70–99)
Glucose-Capillary: 74 mg/dL (ref 70–99)
Glucose-Capillary: 89 mg/dL (ref 70–99)
Glucose-Capillary: 96 mg/dL (ref 70–99)
Glucose-Capillary: 97 mg/dL (ref 70–99)

## 2022-09-18 LAB — BASIC METABOLIC PANEL
Anion gap: 8 (ref 5–15)
BUN: 71 mg/dL — ABNORMAL HIGH (ref 8–23)
CO2: 24 mmol/L (ref 22–32)
Calcium: 8.5 mg/dL — ABNORMAL LOW (ref 8.9–10.3)
Chloride: 111 mmol/L (ref 98–111)
Creatinine, Ser: 2.94 mg/dL — ABNORMAL HIGH (ref 0.44–1.00)
GFR, Estimated: 16 mL/min — ABNORMAL LOW (ref >60)
Glucose, Bld: 100 mg/dL — ABNORMAL HIGH (ref 70–99)
Potassium: 5.1 mmol/L (ref 3.5–5.1)
Sodium: 143 mmol/L (ref 135–145)

## 2022-09-18 LAB — MAGNESIUM: Magnesium: 2.1 mg/dL (ref 1.7–2.4)

## 2022-09-18 MED ORDER — TACROLIMUS 1 MG PO CAPS
1.0000 mg | ORAL_CAPSULE | Freq: Two times a day (BID) | ORAL | Status: DC
  Administered 2022-09-18 – 2022-09-24 (×12): 1 mg via ORAL
  Filled 2022-09-18 (×12): qty 1

## 2022-09-18 MED ORDER — DEXTROSE 50 % IV SOLN
INTRAVENOUS | Status: AC
  Filled 2022-09-18: qty 50

## 2022-09-18 MED ORDER — DEXTROSE 50 % IV SOLN
INTRAVENOUS | Status: AC
  Administered 2022-09-18: 50 mL
  Filled 2022-09-18: qty 50

## 2022-09-18 NOTE — Progress Notes (Signed)
[facility]  PROGRESS NOTE   Subjective:   Still feels very weak and tired.  Appetite poor.  Granddaughter at bedside.  Objective:  Vital signs: Blood pressure (!) 119/57, pulse Jean, temperature 98.9 F (37.2 C), temperature source Oral, resp. rate 16, height '5\' 2"'$  (1.575 m), weight 50.4 kg, SpO2 97 %.  Intake/Output Summary (Last 24 hours) at 09/18/2022 1109 Last data filed at 09/18/2022 1027 Gross per 24 hour  Intake 600 ml  Output 700 ml  Net -100 ml   Filed Weights   09/10/22 0600 09/11/22 0334  Weight: 52.7 kg 50.4 kg     Physical Exam: General:  No acute distress  Head:  Normocephalic, atraumatic. Moist oral mucosal membranes  Eyes:  Anicteric  Neck:  Supple  Lungs:   Clear to auscultation, normal effort  Heart:  S1S2 no rubs  Abdomen:   Soft, nontender, bowel sounds present  Extremities:  peripheral edema.  Neurologic:  Awake, alert, following commands  Skin:  No lesions  Access:     Basic Metabolic Panel: Recent Labs  Lab 09/12/22 0628 09/13/22 0653 09/14/22 0340 09/15/22 0527 09/16/22 0516 09/18/22 0555  NA 141 139 142 143  --  143  K 4.2 4.0 4.0 4.0 4.2 5.1  CL 107 108 109 110  --  111  CO2 '24 25 24 24  '$ --  24  GLUCOSE 97 88 83 88  --  100*  BUN 44* 50* 52* 56*  --  71*  CREATININE 2.44* 2.75* 2.75* 2.83*  --  2.94*  CALCIUM 8.0* 8.2* 8.2* 8.3*  --  8.5*  MG  --   --   --   --  1.8 2.1  PHOS  --   --   --   --  4.4 4.6   GFR: Estimated Creatinine Clearance: 13.5 mL/min (A) (by C-G formula based on SCr of 2.94 mg/dL (H)).  Liver Function Tests: No results for input(s): "AST", "ALT", "ALKPHOS", "BILITOT", "PROT", "ALBUMIN" in the last 168 hours. No results for input(s): "LIPASE", "AMYLASE" in the last 168 hours. No results for input(s): "AMMONIA" in the last 168 hours.  CBC: Recent Labs  Lab 09/12/22 0628 09/13/22 0653 09/14/22 0340 09/18/22 0555  WBC 9.5 8.3 7.3 7.5  NEUTROABS 6.9 6.1 5.4  --   HGB 9.9* 10.0* 9.2* 9.3*   HCT 31.7* 32.4* 28.8* 30.6*  MCV 97.8 98.2 95.4 101.0*  PLT 120* 125* 135* 165     HbA1C: Hgb A1c MFr Bld  Date/Time Value Ref Range Status  04/14/2022 08:32 AM 5.7 (H) 4.8 - 5.6 % Final    Comment:    (NOTE) Pre diabetes:          5.7%-6.4%  Diabetes:              >6.4%  Glycemic control for   <7.0% adults with diabetes   03/25/2021 02:43 AM 5.6 4.8 - 5.6 % Final    Comment:    (NOTE) Pre diabetes:          5.7%-6.4%  Diabetes:              >6.4%  Glycemic control for   <7.0% adults with diabetes     Urinalysis: No results for input(s): "COLORURINE", "LABSPEC", "PHURINE", "GLUCOSEU", "HGBUR", "BILIRUBINUR", "KETONESUR", "PROTEINUR", "UROBILINOGEN", "NITRITE", "LEUKOCYTESUR" in the last 72 hours.  Invalid input(s): "APPERANCEUR"    Imaging: No results found.   Medications:     amiodarone  200 mg Oral BID  Followed by   Derrill Memo ON 09/27/2022] amiodarone  200 mg Oral Daily   apixaban  2.5 mg Oral BID   vitamin C  500 mg Oral BID   atorvastatin  10 mg Oral Daily   diltiazem  60 mg Oral Q12H   docusate sodium  100 mg Oral BID   ezetimibe  10 mg Oral Daily   feeding supplement  237 mL Oral TID BM   furosemide  40 mg Oral BID   insulin aspart  0-9 Units Subcutaneous Q4H   multivitamin with minerals  1 tablet Oral Daily   mycophenolate  500 mg Oral BID   mouth rinse  15 mL Mouth Rinse 4 times per day   potassium chloride SA  20 mEq Oral Daily   pregabalin  50 mg Oral Daily   tacrolimus  2 mg Oral BID   zinc sulfate  220 mg Oral Daily    Assessment/ Plan:     72 y.o.  female  with past medical conditions including diabetes, atrial fibrillation on Eliquis, dyslipidemia, hypertension, end-stage renal disease status post renal transplant x 2, diabetic neuropathy, and PUD.  Patient presents to the emergency department with complaints of shortness of breath and chest pain.  Patient has been admitted for Acute on chronic diastolic heart failure (HCC)  [I50.33] Acute on chronic diastolic (congestive) heart failure (HCC) [I50.33] Dyspnea, unspecified type [R06.00]   #1: Acute kidney injury on chronic kidney disease: Patient is CKD stage IIIb.  Presently worsening renal indicis secondary to IV diuresis.  She may also have cardiorenal syndrome.  She is presently on furosemide 40 mg twice a day.   #2: Renal transplant status: S/p cadaveric renal transplantation twice.  She has been on mycophenolate and tacrolimus.  Her last tacrolimus level was 13.9.  Will decrease the dose of tacrolimus to 1 mg twice a day.  Continue the mycophenolate as ordered.     #3: Anemia: Will continue to monitor closely.   #4: Congestive heart failure: She is advised to stay on 2 g salt restricted diet along with 1000 cc fluid restriction.  Will continue the furosemide 40 mg twice a day for now.  Echocardiogram shows diastolic dysfunction.   #5: Diabetes: Diabetes well-controlled.  Will continue the insulin protocol.  Spoke in detail with the granddaughter at bedside.  Prognosis is guarded.  LOS: [place], MD [facility] 3/10/202411:09 AM

## 2022-09-18 NOTE — Progress Notes (Signed)
Pt Blood sugar 50 notified provider initiated hypoglycemic protocol.

## 2022-09-18 NOTE — Progress Notes (Signed)
PROGRESS NOTE    Jean Johnson   [id number] DOB: 1950/11/28  DOA: 09/06/2022 Date of Service: 09/18/22 PCP: Gladstone Lighter, MD     Brief Narrative / Hospital Course:  72 year old African-American female with past medical history significant for ESRD s/p renal transplant x 2 now having CKD stage IIIb, hypertension, dyslipidemia, hypothyroidism, PAF on anticoagulation with Eliquis, PUD, type 2 diabetes mellitus with diabetic neuropathy as well as other comorbidities who presented to the ED with acute worsening of her dyspnea. She has had diminished urine output without dysuria urinary frequency or flank pain.  02/27: BP was 172/91 and heart rate 103. EKG Aflutter w/ variable AV block. Chest x-ray showed cardiomegaly with central pulmonary vascular congestion and small bilateral pleural effusions and patchy opacities in the lung bases that may reflect edema or infection. BUN 38 and creatinine 1.33. Was given 4 baby aspirin, 0.4 mg sublingual nitroglycerin and 20 mg of IV Lasix. VQ low probability PE 02/28: cardiology saw patient - continue IV lasix bid, echo done  02/29: renal fxn worsening, lasix to continuous infusion, d/c lisinopril  03/01: Nephrology consulted, lasix drip increased. Echo with moderate LVH, mild AS, and mild-moderate TR, overall similar to prior from 2023    03/02-03/04: Remain on lasix gtt.  03/04: transition from lasix gtt to po  03/05: remained stable on po meds  03/06: Cardiology s/o. Palliative care [place] discussion. PT/OT to see.  03/07-03/10: PT/OT recs for SNF, TOC following, placement pending thru weekend.     Consultants:  Cardiology Palliative Care  Nephrology   Procedures: none      ASSESSMENT & PLAN:   Principal Problem:   Acute on chronic diastolic (congestive) heart failure (HCC) Active Problems:   Hypertensive urgency   Chest pain   Elevated d-dimer   Renal transplant recipient   Dyslipidemia   Paroxysmal atrial fibrillation  ([place])   Type 2 diabetes mellitus with chronic kidney disease, with long-term current use of insulin (HCC)   Diabetic neuropathy (HCC)   Acute on chronic diastolic heart failure (HCC)   Protein-calorie malnutrition, severe   Acute on chronic diastolic congestive heart failure - stable/baseline  Hypertensive urgency - resolved  Lasix drip to oral Lasix on 09/12/2022 and has remained euvolemic  ACE inhibitor on hold at this time in the setting of increasing creatinine, other GDMT limited by renal function  Strict I's and O's, low-salt diet, daily weights Cardiology s/o 03/06 Nephrology following.     Paroxysmal atrial fibrillation with rapid ventricular response, now rate controlled Eliquis 2.5 mg twice daily Cardizem 120 mg twice daily Given worsening renal function flecainide was switched to amiodarone '200mg'$  BID x 14 days, then '200mg'$  daily thereafter    ESRD s/p renal transplant x 2 Chronic immunosuppression Acute on chronic renal failure likely multifactorial in the setting of diuresis as well as hepatorenal syndrome Still makes urine.  Followed by nephrology outpatient. Baseline creatinine 1.29-1.33 Continue immunosuppressive regimen of CellCept and tacrolimus  Nephrology on board - appreciate recommendations   Elevated troponin likely demand ischemia Dyslipidemia Borderline elevation.  Flat trend Not indicative of ACS Continue continue aspirin and statin therapy   Diabetic neuropathy (HCC) Continue with Pregabalin 50 mg p.o. twice daily --> reduced d/t renal function    Type 2 diabetes mellitus with chronic kidney disease, with long-term current use of insulin (HCC) continue insulin therapy Continue to Monitor CBG's per Protocol        DVT prophylaxis: eliquis Pertinent IV fluids/nutrition: no continuous IV fluids,  poor po intake but improving  Central lines / invasive devices: none  Code Status: FULL CODE   Current Admission Status: inpatient   TOC needs / Dispo  plan: SNF rehab Barriers to discharge / significant pending items: SNF placement              Subjective / Brief ROS:  Patient resting comfortably. Sister in law is at bedside, no concerns from patient or family.    Family Communication: family at bedside on rounds    Objective Findings:  Vitals:   09/17/22 1645 09/17/22 1929 09/18/22 0420 09/18/22 0722  BP: (!) 123/59 (!) 103/59 (!) 113/27 (!) 119/57  Pulse: 88 88 69 84  Resp: '20 16 18 16  '$ Temp: (!) 97.4 F (36.3 C) 98.1 F (36.7 C) 99.3 F (37.4 C) 98.9 F (37.2 C)  TempSrc: Oral Oral  Oral  SpO2: 98% 96% 91% 97%  Weight:      Height:        Intake/Output Summary (Last 24 hours) at 09/18/2022 1055 Last data filed at 09/18/2022 1027 Gross per 24 hour  Intake 600 ml  Output 700 ml  Net -100 ml   Filed Weights   09/10/22 0600 09/11/22 0334  Weight: 52.7 kg 50.4 kg    Examination:  Physical Exam Constitutional:      General: She is not in acute distress.    Appearance: She is not toxic-appearing.  Pulmonary:     Effort: Pulmonary effort is normal. No tachypnea.     Breath sounds: Examination of the right-lower field reveals decreased breath sounds. Examination of the left-lower field reveals decreased breath sounds. Decreased breath sounds present.  Musculoskeletal:     Right lower leg: No edema.     Left lower leg: No edema.  Skin:    General: Skin is warm and dry.  Neurological:     Mental Status: She is alert. Mental status is at baseline.          Scheduled Medications:   amiodarone  200 mg Oral BID   Followed by   Derrill Memo ON 09/27/2022] amiodarone  200 mg Oral Daily   apixaban  2.5 mg Oral BID   vitamin C  500 mg Oral BID   atorvastatin  10 mg Oral Daily   diltiazem  60 mg Oral Q12H   docusate sodium  100 mg Oral BID   ezetimibe  10 mg Oral Daily   feeding supplement  237 mL Oral TID BM   furosemide  40 mg Oral BID   insulin aspart  0-9 Units Subcutaneous Q4H   multivitamin with  minerals  1 tablet Oral Daily   mycophenolate  500 mg Oral BID   mouth rinse  15 mL Mouth Rinse 4 times per day   potassium chloride SA  20 mEq Oral Daily   pregabalin  50 mg Oral Daily   tacrolimus  2 mg Oral BID   zinc sulfate  220 mg Oral Daily    Continuous Infusions:   PRN Medications:  acetaminophen **OR** acetaminophen, iohexol, labetalol, morphine injection, nitroGLYCERIN, ondansetron **OR** ondansetron (ZOFRAN) IV, mouth rinse, traZODone  Antimicrobials from admission:  Anti-infectives (From admission, onward)    None           Data Reviewed:  I have personally reviewed the following...  CBC: Recent Labs  Lab 09/12/22 0628 09/13/22 0653 09/14/22 0340 09/18/22 0555  WBC 9.5 8.3 7.3 7.5  NEUTROABS 6.9 6.1 5.4  --   HGB 9.9* 10.0*  9.2* 9.3*  HCT 31.7* 32.4* 28.8* 30.6*  MCV 97.8 98.2 95.4 101.0*  PLT 120* 125* 135* [id number]   Basic Metabolic Panel: Recent Labs  Lab 09/12/22 0628 09/13/22 0653 09/14/22 0340 09/15/22 0527 09/16/22 0516 09/18/22 0555  NA 141 139 142 143  --  143  K 4.2 4.0 4.0 4.0 4.2 5.1  CL 107 108 109 110  --  111  CO2 '24 25 24 24  '$ --  24  GLUCOSE 97 88 83 88  --  100*  BUN 44* 50* 52* 56*  --  71*  CREATININE 2.44* 2.75* 2.75* 2.83*  --  2.94*  CALCIUM 8.0* 8.2* 8.2* 8.3*  --  8.5*  MG  --   --   --   --  1.8 2.1  PHOS  --   --   --   --  4.4 4.6   GFR: Estimated Creatinine Clearance: 13.5 mL/min (A) (by C-G formula based on SCr of 2.94 mg/dL (H)). Liver Function Tests: No results for input(s): "AST", "ALT", "ALKPHOS", "BILITOT", "PROT", "ALBUMIN" in the last 168 hours.  No results for input(s): "LIPASE", "AMYLASE" in the last 168 hours. No results for input(s): "AMMONIA" in the last 168 hours. Coagulation Profile: No results for input(s): "INR", "PROTIME" in the last 168 hours. Cardiac Enzymes: No results for input(s): "CKTOTAL", "CKMB", "CKMBINDEX", "TROPONINI" in the last 168 hours. BNP (last 3 results) No results for  input(s): "PROBNP" in the last 8760 hours. HbA1C: No results for input(s): "HGBA1C" in the last 72 hours. CBG: Recent Labs  Lab 09/17/22 1715 09/17/22 2032 09/18/22 0023 09/18/22 0427 09/18/22 0752  GLUCAP 118* 132* 96 89 97   Lipid Profile: No results for input(s): "CHOL", "HDL", "LDLCALC", "TRIG", "CHOLHDL", "LDLDIRECT" in the last 72 hours. Thyroid Function Tests: No results for input(s): "TSH", "T4TOTAL", "FREET4", "T3FREE", "THYROIDAB" in the last 72 hours. Anemia Panel: No results for input(s): "VITAMINB12", "FOLATE", "FERRITIN", "TIBC", "IRON", "RETICCTPCT" in the last 72 hours. Most Recent Urinalysis On File:     Component Value Date/Time   COLORURINE STRAW (A) 09/06/2022 2353   APPEARANCEUR CLEAR (A) 09/06/2022 2353   APPEARANCEUR Hazy (A) 11/16/2020 1344   LABSPEC 1.007 09/06/2022 2353   PHURINE 5.0 09/06/2022 2353   GLUCOSEU NEGATIVE 09/06/2022 2353   HGBUR MODERATE (A) 09/06/2022 2353   Oracle NEGATIVE 09/06/2022 2353   BILIRUBINUR Negative 11/16/2020 1344   KETONESUR NEGATIVE 09/06/2022 2353   PROTEINUR >=300 (A) 09/06/2022 2353   NITRITE NEGATIVE 09/06/2022 2353   LEUKOCYTESUR NEGATIVE 09/06/2022 2353   Sepsis Labs: '@LABRCNTIP'$ (procalcitonin:4,lacticidven:4) Microbiology: No results found for this or any previous visit (from the past 240 hour(s)).    Radiology Studies last 3 days: No results found.           LOS: 12 days       Emeterio Reeve, DO [facility] 09/18/2022, 10:55 AM    Dictation software may have been used to generate the above note. Typos may occur and escape review in typed/dictated notes. Please contact Dr Sheppard Coil directly for clarity if needed.  Staff may message me via secure chat in [place]  but this may not receive an immediate response,  please page me for urgent matters!  If 7PM-7AM, please contact night coverage [email or web address]

## 2022-09-18 NOTE — TOC Progression Note (Addendum)
Transition of Care [facility]) - Progression Note    Patient Details  Name: Jean Johnson MRN: [id number] Date of Birth: 18-Jun-1951  Transition of Care [facility]) CM/SW Contact  [name], RN Phone Number: 09/18/2022, 12:20 PM  Clinical Narrative:     Patient has been accepted to [facility] and [facility].  Call placed to sister in law to discuss offers, message left.   Update 1340:  Call received back from sister in law, would like patient to go to [facility] once medically stable.   Expected Discharge Plan: [place] Barriers to Discharge: Continued Medical Work up  Expected Discharge Plan and Services       Living arrangements for the past 2 months: [place]: [name] ([place]) Date [place]: 09/08/22 Time [place]: [place] Representative spoke with at [place]: [place] ([place]) Interventions SDOH Screenings   Food Insecurity: No Food Insecurity (09/07/2022)  Housing: Low Risk  (09/07/2022)  Transportation Needs: No Transportation Needs (09/07/2022)  Utilities: Not At Risk (09/07/2022)  Tobacco Use: Low Risk  (09/10/2022)    Readmission Risk Interventions    09/08/2022    4:23 PM 04/27/2022    3:34 PM  Readmission Risk Prevention Plan  Transportation Screening Complete Complete  HRI or Home Care Consult  Complete  Social Work Consult for [place] Planning/Counseling  Complete  Palliative Care Screening  Not Applicable  Medication Review Press photographer) Complete Complete  PCP or Specialist appointment within 3-5 days of discharge Complete   HRI or [place] Complete   SW Recovery Care/Counseling Consult Complete   Palliative Care Screening Not Complete   Skilled Nursing Facility Complete

## 2022-09-19 DIAGNOSIS — Z7189 Other specified counseling: Secondary | ICD-10-CM | POA: Diagnosis not present

## 2022-09-19 DIAGNOSIS — I5033 Acute on chronic diastolic (congestive) heart failure: Secondary | ICD-10-CM | POA: Diagnosis not present

## 2022-09-19 LAB — BASIC METABOLIC PANEL
Anion gap: 8 (ref 5–15)
BUN: 73 mg/dL — ABNORMAL HIGH (ref 8–23)
CO2: 24 mmol/L (ref 22–32)
Calcium: 8.6 mg/dL — ABNORMAL LOW (ref 8.9–10.3)
Chloride: 111 mmol/L (ref 98–111)
Creatinine, Ser: 3.03 mg/dL — ABNORMAL HIGH (ref 0.44–1.00)
GFR, Estimated: 16 mL/min — ABNORMAL LOW (ref >60)
Glucose, Bld: 80 mg/dL (ref 70–99)
Potassium: 4.9 mmol/L (ref 3.5–5.1)
Sodium: 143 mmol/L (ref 135–145)

## 2022-09-19 LAB — GLUCOSE, CAPILLARY: Glucose-Capillary: 148 mg/dL — ABNORMAL HIGH (ref 70–99)

## 2022-09-19 NOTE — Progress Notes (Signed)
[facility]  ROUNDING NOTE   Subjective:   Jean Johnson is a 72 y.o. female with past medical conditions including diabetes, atrial fibrillation on Eliquis, dyslipidemia, hypertension, end-stage renal disease status post renal transplant x 2, diabetic neuropathy, and PUD.  Patient presents to the emergency department with complaints of shortness of breath and chest pain.  Patient has been admitted for Acute on chronic diastolic heart failure (HCC) [I50.33] Acute on chronic diastolic (congestive) heart failure (HCC) [I50.33] Dyspnea, unspecified type [R06.00]  Patient is known to our practice and is followed outpatient by Dr. Lanora Manis.  Patient was last seen in office on 08/23/2022 for routine follow-up.    Patient seen resting quietly, husband at bedside Currently denies pain or discomfort Remains on room air No lower extremity edema Voicing concerns about decreased tacrolimus dosing.  Objective:  Vital signs in last 24 hours:  Temp:  [98 F (36.7 C)-99.2 F (37.3 C)] 98 F (36.7 C) (03/11 0753) Pulse Rate:  [84-97] 92 (03/11 0753) Resp:  [14-18] 18 (03/11 0753) BP: (95-131)/(56-76) 131/56 (03/11 0753) SpO2:  [98 %-100 %] 98 % (03/11 0753)  Weight change:  Filed Weights   09/10/22 0600 09/11/22 0334  Weight: 52.7 kg 50.4 kg    Intake/Output: I/O last 3 completed shifts: In: 360 [P.O.:360] Out: 1200 [Urine:1200]   Intake/Output this shift:  No intake/output data recorded.  Physical Exam: General: NAD  Head: Normocephalic, atraumatic. Moist oral mucosal membranes  Eyes: Anicteric  Lungs:  Clear to auscultation, normal effort  Heart: Regular rate and rhythm  Abdomen:  Soft, nontender, nondistended  Extremities: No peripheral edema.  Neurologic: Nonfocal, moving all four extremities  Skin: No lesions  Access: None    Basic Metabolic Panel: Recent Labs  Lab 09/13/22 0653 09/14/22 0340 09/15/22 0527 09/16/22 0516 09/18/22 0555  09/19/22 0658  NA 139 142 143  --  143 143  K 4.0 4.0 4.0 4.2 5.1 4.9  CL 108 109 110  --  111 111  CO2 '25 24 24  '$ --  24 24  GLUCOSE 88 83 88  --  100* 80  BUN 50* 52* 56*  --  71* 73*  CREATININE 2.75* 2.75* 2.83*  --  2.94* 3.03*  CALCIUM 8.2* 8.2* 8.3*  --  8.5* 8.6*  MG  --   --   --  1.8 2.1  --   PHOS  --   --   --  4.4 4.6  --      Liver Function Tests: No results for input(s): "AST", "ALT", "ALKPHOS", "BILITOT", "PROT", "ALBUMIN" in the last 168 hours.  No results for input(s): "LIPASE", "AMYLASE" in the last 168 hours. No results for input(s): "AMMONIA" in the last 168 hours.  CBC: Recent Labs  Lab 09/13/22 0653 09/14/22 0340 09/18/22 0555  WBC 8.3 7.3 7.5  NEUTROABS 6.1 5.4  --   HGB 10.0* 9.2* 9.3*  HCT 32.4* 28.8* 30.6*  MCV 98.2 95.4 101.0*  PLT 125* 135* 165     Cardiac Enzymes: No results for input(s): "CKTOTAL", "CKMB", "CKMBINDEX", "TROPONINI" in the last 168 hours.  BNP: Invalid input(s): "POCBNP"  CBG: Recent Labs  Lab 09/18/22 0752 09/18/22 1143 09/18/22 1656 09/18/22 1826 09/18/22 2114  GLUCAP 97 160* 50* 122* 75     Microbiology: Results for orders placed or performed during the hospital encounter of 08/29/22  Surgical pcr screen     Status: None   Collection Time: 08/29/22 11:08 AM   Specimen: Nasal Mucosa;  Nasal Swab  Result Value Ref Range Status   MRSA, PCR NEGATIVE NEGATIVE Final   Staphylococcus aureus NEGATIVE NEGATIVE Final    Comment: (NOTE) The Xpert SA Assay (FDA approved for NASAL specimens in patients 60 years of age and older), is one component of a comprehensive surveillance program. It is not intended to diagnose infection nor to guide or monitor treatment. Performed at [facility], [address] [place], [place] [address]     Coagulation Studies: No results for input(s): "LABPROT", "INR" in the last 72 hours.  Urinalysis: No results for input(s): "COLORURINE", "LABSPEC", "PHURINE",  "GLUCOSEU", "HGBUR", "BILIRUBINUR", "KETONESUR", "PROTEINUR", "UROBILINOGEN", "NITRITE", "LEUKOCYTESUR" in the last 72 hours.  Invalid input(s): "APPERANCEUR"     Imaging: No results found.   Medications:      amiodarone  200 mg Oral BID   Followed by   Derrill Memo ON 09/27/2022] amiodarone  200 mg Oral Daily   apixaban  2.5 mg Oral BID   vitamin C  500 mg Oral BID   atorvastatin  10 mg Oral Daily   diltiazem  60 mg Oral Q12H   docusate sodium  100 mg Oral BID   ezetimibe  10 mg Oral Daily   feeding supplement  237 mL Oral TID BM   furosemide  40 mg Oral BID   multivitamin with minerals  1 tablet Oral Daily   mycophenolate  500 mg Oral BID   mouth rinse  15 mL Mouth Rinse 4 times per day   potassium chloride SA  20 mEq Oral Daily   pregabalin  50 mg Oral Daily   tacrolimus  1 mg Oral BID   zinc sulfate  220 mg Oral Daily   acetaminophen **OR** acetaminophen, iohexol, labetalol, morphine injection, nitroGLYCERIN, ondansetron **OR** ondansetron (ZOFRAN) IV, mouth rinse, traZODone  Assessment/ Plan:  [name] is a 72 y.o.  female  with past medical conditions including diabetes, atrial fibrillation on Eliquis, dyslipidemia, hypertension, end-stage renal disease status post renal transplant x 2, diabetic neuropathy, and PUD.  Patient presents to the emergency department with complaints of shortness of breath and chest pain.  Patient has been admitted for Acute on chronic diastolic heart failure (HCC) [I50.33] Acute on chronic diastolic (congestive) heart failure (HCC) [I50.33] Dyspnea, unspecified type [R06.00]   Acute Kidney Injury on chronic kidney disease stage IIIAt with baseline creatinine 1.27 and GFR of 45 on 08/18/22.  Acute kidney injury secondary to cardiorenal syndrome Chronic kidney disease is secondary to diabetes, hypertension and CHF. No exposure to IV contrast. Furosemide drip started in ED.   Creatinine continues to slowly rise.  Continue diuretic as  prescribed.  Tacrolimus decreased to 1 mg twice daily.  Continue mycophenolate as ordered.  Lab Results  Component Value Date   CREATININE 3.03 (H) 09/19/2022   CREATININE 2.94 (H) 09/18/2022   CREATININE 2.83 (H) 09/15/2022    Intake/Output Summary (Last 24 hours) at 09/19/2022 1356 Last data filed at 09/19/2022 1042 Gross per 24 hour  Intake 0 ml  Output 500 ml  Net -500 ml    2. Anemia of chronic kidney disease  Lab Results  Component Value Date   HGB 9.3 (L) 09/18/2022    Hemoglobin acceptable at this time.  3. Secondary Hyperparathyroidism: with outpatient labs: PTH 141, phosphorus 3.2, calcium 9.7 on 08/18/22.   Lab Results  Component Value Date   CALCIUM 8.6 (L) 09/19/2022   CAION 1.25 08/30/2022   PHOS 4.6 09/18/2022    Calcium  and phosphorus within desired range.  4. Diabetes mellitus type II with chronic kidney disease/renal manifestations: insulin/noninsulin dependent. Home regimen includes Humalog and levemir. Most recent hemoglobin A1c is 5.7 on 04/14/22.   Primary team to manage sliding scale insulin.  5. Acute on Chronic diastolic heart failure. ECHO on 09/08/22 shows EF 60-65% with a grade 1 diastolic dysfunction, mild AVR with stenosis.   Cotninue Furosemide to '40mg'$  BID.     LOS: 13   3/11/20241:56 PM

## 2022-09-19 NOTE — Progress Notes (Signed)
Physical Therapy Treatment Patient Details Name: Jean Johnson MRN: [id number] DOB: 1950-10-25 Today's Date: 09/19/2022   History of Present Illness Jean Johnson is a 72 y.o. African-American female with medical history significant for end-stage renal disease s/p renal transplant x2, hypertension, dyslipidemia, hypothyroidism, paroxysmal atrial fibrillation on Eliquis, PUD, type 2 diabetes mellitus with diabetic neuropathy, who presented to the emergency room with acute onset of worsening dyspnea with associated chest pain which has been progressively worse over the last week.    PT Comments    Pt is making gradual progress towards goals, limited by pain and skin breakdown on sacrum. Pt very resistant to mobility efforts and noted untouched lunch tray. Pt reports she would consider eating and this therapist assisted in repositioning in bed. Able to perform limited supine AAROM. Will continue to progress as able.   Recommendations for follow up therapy are one component of a multi-disciplinary discharge planning process, led by the attending physician.  Recommendations may be updated based on patient status, additional functional criteria and insurance authorization.  Follow Up Recommendations  Skilled nursing-short term rehab (<3 hours/day) Can patient physically be transported by private vehicle: No   Assistance Recommended at Discharge Frequent or constant Supervision/Assistance  Patient can return home with the following A lot of help with walking and/or transfers;A lot of help with bathing/dressing/bathroom;Assistance with cooking/housework;Assistance with feeding;Direct supervision/assist for medications management;Direct supervision/assist for financial management;Assist for transportation;Help with stairs or ramp for entrance   Equipment Recommendations  None recommended by PT    Recommendations for Other Services       Precautions / Restrictions  Precautions Precautions: Fall Precaution Comments: Sacral wound; aspiration; L UE AV fistula, painful L LE Restrictions Weight Bearing Restrictions: No     Mobility  Bed Mobility Overal bed mobility: Needs Assistance             General bed mobility comments: deferred most mobility due to pain. Max assist +2 for repositioning as pt slid down in bed and is hoping to eat lunch. Total assist for sliding up towards [place]. Very painful with repositioning.    Transfers                   General transfer comment: unable to tolerate    Ambulation/Gait                   Stairs             Wheelchair Mobility    Modified Rankin (Stroke Patients Only)       Balance                                            Cognition Arousal/Alertness: Awake/alert Behavior During Therapy: WFL for tasks assessed/performed Overall Cognitive Status: Within Functional Limits for tasks assessed                                 General Comments: reports she needs to eat, however resistant to repositioning due to pain        Exercises Other Exercises Other Exercises: encouraged participation, however pt very limited in ability. AAROM for B LE hip abd/add, heel slides, and AP. Pain L>R.    General Comments        Pertinent Vitals/Pain Pain Assessment Pain Assessment:  Faces Faces Pain Scale: Hurts whole lot Pain Location: sacrum, L LE with movement Pain Descriptors / Indicators: Discomfort, Grimacing, Guarding, Sore Pain Intervention(s): Limited activity within patient's tolerance, Repositioned    Home Living                          Prior Function            PT Goals (current goals can now be found in the care plan section) Acute Rehab PT Goals Patient Stated Goal: to go to rehab PT Goal Formulation: With patient Time For Goal Achievement: 09/22/22 Potential to Achieve Goals: Fair Progress towards PT goals:  Progressing toward goals    Frequency    Min 2X/week      PT Plan Current plan remains appropriate    Co-evaluation              AM-PAC PT "6 Clicks" Mobility   Outcome Measure  Help needed turning from your back to your side while in a flat bed without using bedrails?: A Lot Help needed moving from lying on your back to sitting on the side of a flat bed without using bedrails?: Total Help needed moving to and from a bed to a chair (including a wheelchair)?: Total Help needed standing up from a chair using your arms (e.g., wheelchair or bedside chair)?: Total Help needed to walk in hospital room?: Total Help needed climbing 3-5 steps with a railing? : Total 6 Click Score: 7    End of Session   Activity Tolerance: Patient limited by pain;Patient limited by fatigue Patient left: in bed;with call bell/phone within reach Nurse Communication: Mobility status PT Visit Diagnosis: Other abnormalities of gait and mobility (R26.89);Muscle weakness (generalized) (M62.81)     Time: 1356-1410 PT Time Calculation (min) (ACUTE ONLY): 14 min  Charges:  $Therapeutic Activity: 8-22 mins                     Greggory Stallion, PT, DPT, GCS [phone]    Jean Johnson 09/19/2022, 3:24 PM

## 2022-09-19 NOTE — Progress Notes (Signed)
PROGRESS NOTE    Jean Johnson   [id number] DOB: 1950-10-22  DOA: 09/06/2022 Date of Service: 09/19/22 PCP: Gladstone Lighter, MD     Brief Narrative / Hospital Course:  72 year old African-American female with past medical history significant for ESRD s/p renal transplant x 2 now having CKD stage IIIb, hypertension, dyslipidemia, hypothyroidism, PAF on anticoagulation with Eliquis, PUD, type 2 diabetes mellitus with diabetic neuropathy as well as other comorbidities who presented to the ED with acute worsening of her dyspnea. She has had diminished urine output without dysuria urinary frequency or flank pain.  02/27: BP was 172/91 and heart rate 103. EKG Aflutter w/ variable AV block. Chest x-ray showed cardiomegaly with central pulmonary vascular congestion and small bilateral pleural effusions and patchy opacities in the lung bases that may reflect edema or infection. BUN 38 and creatinine 1.33. Was given 4 baby aspirin, 0.4 mg sublingual nitroglycerin and 20 mg of IV Lasix. VQ low probability PE 02/28: cardiology saw patient - continue IV lasix bid, echo done  02/29: renal fxn worsening, lasix to continuous infusion, d/c lisinopril  03/01: Nephrology consulted, lasix drip increased. Echo with moderate LVH, mild AS, and mild-moderate TR, overall similar to prior from 2023    03/02-03/04: Remain on lasix gtt.  03/04: transition from lasix gtt to po  03/05: remained stable on po meds  03/06: Cardiology s/o. Palliative care [place] discussion. PT/OT to see.  03/07-03/10: PT/OT recs for SNF, TOC following, placement pending thru weekend.  03/11 Mon SNF still in process   Consultants:  Cardiology Palliative Care  Nephrology   Procedures: none      ASSESSMENT & PLAN:   Principal Problem:   Acute on chronic diastolic (congestive) heart failure (HCC) Active Problems:   Hypertensive urgency   Chest pain   Elevated d-dimer   Renal transplant recipient   Dyslipidemia    Paroxysmal atrial fibrillation ([place])   Type 2 diabetes mellitus with chronic kidney disease, with long-term current use of insulin (HCC)   Diabetic neuropathy (HCC)   Acute on chronic diastolic heart failure (HCC)   Protein-calorie malnutrition, severe   Acute on chronic diastolic congestive heart failure - stable/baseline  Hypertensive urgency - resolved  Lasix drip to oral Lasix on 09/12/2022 and has remained euvolemic  ACE inhibitor on hold at this time in the setting of increasing creatinine, other GDMT limited by renal function  Strict I's and O's, low-salt diet, daily weights Cardiology s/o 03/06 Nephrology following.     Paroxysmal atrial fibrillation with rapid ventricular response, now rate controlled Eliquis 2.5 mg twice daily Cardizem 120 mg twice daily Given worsening renal function flecainide was switched to amiodarone '200mg'$  BID x 14 days, then '200mg'$  daily thereafter    ESRD s/p renal transplant x 2 Chronic immunosuppression Acute on chronic renal failure likely multifactorial in the setting of diuresis as well as hepatorenal syndrome Still makes urine.  Followed by nephrology outpatient. Baseline creatinine 1.29-1.33 Continue immunosuppressive regimen of CellCept and tacrolimus  Nephrology on board - appreciate recommendations   Elevated troponin likely demand ischemia Dyslipidemia Borderline elevation.  Flat trend Not indicative of ACS Continue continue aspirin and statin therapy   Diabetic neuropathy (HCC) Continue with Pregabalin 50 mg p.o. twice daily --> reduced d/t renal function    Type 2 diabetes mellitus with chronic kidney disease, with long-term current use of insulin (HCC) continue insulin therapy Continue to Monitor CBG's per Protocol        DVT prophylaxis: eliquis Pertinent IV  fluids/nutrition: no continuous IV fluids, poor po intake but improving  Central lines / invasive devices: none  Code Status: FULL CODE   Current Admission Status:  inpatient   TOC needs / Dispo plan: SNF rehab Barriers to discharge / significant pending items: SNF placement              Subjective / Brief ROS:  Patient resting comfortably. Husband at bedside   Family Communication: family at bedside on rounds    Objective Findings:  Vitals:   09/18/22 1600 09/18/22 1946 09/19/22 0514 09/19/22 0753  BP: 116/76 113/68 95/64 (!) 131/56  Pulse: 84  97 92  Resp: '14 18 16 18  '$ Temp: 99.2 F (37.3 C) 98.9 F (37.2 C) 98.5 F (36.9 C) 98 F (36.7 C)  TempSrc: Oral Oral    SpO2:   100% 98%  Weight:      Height:        Intake/Output Summary (Last 24 hours) at 09/19/2022 1609 Last data filed at 09/19/2022 1042 Gross per 24 hour  Intake 0 ml  Output 500 ml  Net -500 ml   Filed Weights   09/10/22 0600 09/11/22 0334  Weight: 52.7 kg 50.4 kg    Examination:  Physical Exam Constitutional:      General: She is not in acute distress.    Appearance: She is not toxic-appearing.  Pulmonary:     Effort: Pulmonary effort is normal. No tachypnea.     Breath sounds: Decreased breath sounds present.  Skin:    General: Skin is warm and dry.  Neurological:     Mental Status: Mental status is at baseline.          Scheduled Medications:   amiodarone  200 mg Oral BID   Followed by   Derrill Memo ON 09/27/2022] amiodarone  200 mg Oral Daily   apixaban  2.5 mg Oral BID   vitamin C  500 mg Oral BID   atorvastatin  10 mg Oral Daily   diltiazem  60 mg Oral Q12H   docusate sodium  100 mg Oral BID   ezetimibe  10 mg Oral Daily   feeding supplement  237 mL Oral TID BM   furosemide  40 mg Oral BID   multivitamin with minerals  1 tablet Oral Daily   mycophenolate  500 mg Oral BID   mouth rinse  15 mL Mouth Rinse 4 times per day   potassium chloride SA  20 mEq Oral Daily   pregabalin  50 mg Oral Daily   tacrolimus  1 mg Oral BID   zinc sulfate  220 mg Oral Daily    Continuous Infusions:   PRN Medications:  acetaminophen **OR**  acetaminophen, iohexol, labetalol, morphine injection, nitroGLYCERIN, ondansetron **OR** ondansetron (ZOFRAN) IV, mouth rinse, traZODone  Antimicrobials from admission:  Anti-infectives (From admission, onward)    None           Data Reviewed:  I have personally reviewed the following...  CBC: Recent Labs  Lab 09/13/22 0653 09/14/22 0340 09/18/22 0555  WBC 8.3 7.3 7.5  NEUTROABS 6.1 5.4  --   HGB 10.0* 9.2* 9.3*  HCT 32.4* 28.8* 30.6*  MCV 98.2 95.4 101.0*  PLT 125* 135* [id number]   Basic Metabolic Panel: Recent Labs  Lab 09/13/22 0653 09/14/22 0340 09/15/22 0527 09/16/22 0516 09/18/22 0555 09/19/22 0658  NA 139 142 143  --  143 143  K 4.0 4.0 4.0 4.2 5.1 4.9  CL 108 109 110  --  111 111  CO2 '25 24 24  '$ --  24 24  GLUCOSE 88 83 88  --  100* 80  BUN 50* 52* 56*  --  71* 73*  CREATININE 2.75* 2.75* 2.83*  --  2.94* 3.03*  CALCIUM 8.2* 8.2* 8.3*  --  8.5* 8.6*  MG  --   --   --  1.8 2.1  --   PHOS  --   --   --  4.4 4.6  --    GFR: Estimated Creatinine Clearance: 13.1 mL/min (A) (by C-G formula based on SCr of 3.03 mg/dL (H)). Liver Function Tests: No results for input(s): "AST", "ALT", "ALKPHOS", "BILITOT", "PROT", "ALBUMIN" in the last 168 hours.  No results for input(s): "LIPASE", "AMYLASE" in the last 168 hours. No results for input(s): "AMMONIA" in the last 168 hours. Coagulation Profile: No results for input(s): "INR", "PROTIME" in the last 168 hours. Cardiac Enzymes: No results for input(s): "CKTOTAL", "CKMB", "CKMBINDEX", "TROPONINI" in the last 168 hours. BNP (last 3 results) No results for input(s): "PROBNP" in the last 8760 hours. HbA1C: No results for input(s): "HGBA1C" in the last 72 hours. CBG: Recent Labs  Lab 09/18/22 0752 09/18/22 1143 09/18/22 1656 09/18/22 1826 09/18/22 2114  GLUCAP 97 160* 50* 122* 74   Lipid Profile: No results for input(s): "CHOL", "HDL", "LDLCALC", "TRIG", "CHOLHDL", "LDLDIRECT" in the last 72 hours. Thyroid  Function Tests: No results for input(s): "TSH", "T4TOTAL", "FREET4", "T3FREE", "THYROIDAB" in the last 72 hours. Anemia Panel: No results for input(s): "VITAMINB12", "FOLATE", "FERRITIN", "TIBC", "IRON", "RETICCTPCT" in the last 72 hours. Most Recent Urinalysis On File:     Component Value Date/Time   COLORURINE STRAW (A) 09/06/2022 2353   APPEARANCEUR CLEAR (A) 09/06/2022 2353   APPEARANCEUR Hazy (A) 11/16/2020 1344   LABSPEC 1.007 09/06/2022 2353   PHURINE 5.0 09/06/2022 2353   GLUCOSEU NEGATIVE 09/06/2022 2353   HGBUR MODERATE (A) 09/06/2022 2353   [place] NEGATIVE 09/06/2022 2353   BILIRUBINUR Negative 11/16/2020 1344   [place] 09/06/2022 2353   PROTEINUR >=300 (A) 09/06/2022 2353   NITRITE NEGATIVE 09/06/2022 2353   LEUKOCYTESUR NEGATIVE 09/06/2022 2353   Sepsis Labs: '@LABRCNTIP'$ (procalcitonin:4,lacticidven:4) Microbiology: No results found for this or any previous visit (from the past 240 hour(s)).    Radiology Studies last 3 days: No results found.           LOS: 13 days       Emeterio Reeve, DO [facility] 09/19/2022, 4:09 PM    Dictation software may have been used to generate the above note. Typos may occur and escape review in typed/dictated notes. Please contact Dr Sheppard Coil directly for clarity if needed.  Staff may message me via secure chat in [place]  but this may not receive an immediate response,  please page me for urgent matters!  If 7PM-7AM, please contact night coverage [email or web address]

## 2022-09-19 NOTE — Care Management Important Message (Signed)
Important Message  Patient Details  Name: Rosangela Garrette MRN: [id number] Date of Birth: 1950-11-24   Medicare Important Message Given:  Yes     Dannette Barbara 09/19/2022, 10:58 AM

## 2022-09-19 NOTE — TOC Progression Note (Signed)
Transition of Care [facility] - Progression Note    Patient Details  Name: Jean Johnson MRN: [id number] Date of Birth: [birth year]  Transition of Care [facility]) CM/SW Contact  Beverly Sessions, RN Phone Number: 09/19/2022, 4:02 PM  Clinical Narrative:    Per MD patient medically ready for discharge.  Notified Sharyn Lull at [facility] and they can accept patient tomorrow    Expected Discharge Plan: [place] Barriers to Discharge: Continued Medical Work up  Expected Discharge Plan and [place] arrangements for the past 2 months: [place]: [place] ([place]) Date [place]: 09/08/22 Time [place]: [place] Representative spoke with at [place]: [place] ([place]) Interventions SDOH Screenings   Food Insecurity: No Food Insecurity (09/07/2022)  Housing: Low Risk  (09/07/2022)  Transportation Needs: No Transportation Needs (09/07/2022)  Utilities: Not At Risk (09/07/2022)  Tobacco Use: Low Risk  (09/10/2022)    Readmission Risk Interventions    09/08/2022    4:23 PM 04/27/2022    3:34 PM  Readmission Risk Prevention Plan  Transportation Screening Complete Complete  HRI or Home Care Consult  Complete  Social Work Consult for [place] Planning/Counseling  Complete  Palliative Care Screening  Not Applicable  Medication Review Press photographer) Complete Complete  PCP or Specialist appointment within 3-5 days of discharge Complete   HRI or [place] Complete   SW Recovery Care/Counseling Consult Complete   Palliative Care Screening Not Complete   Skilled Nursing Facility Complete

## 2022-09-19 NOTE — Progress Notes (Signed)
Occupational Therapy Treatment Patient Details Name: Jean Johnson MRN: [id number] DOB: February 27, 1951 Today's Date: 09/19/2022   History of present illness Jean Johnson is a 72 y.o. African-American female with medical history significant for end-stage renal disease s/p renal transplant x2, hypertension, dyslipidemia, hypothyroidism, paroxysmal atrial fibrillation on Eliquis, PUD, type 2 diabetes mellitus with diabetic neuropathy, who presented to the emergency room with acute onset of worsening dyspnea with associated chest pain which has been progressively worse over the last week.   OT comments  Patient received supine in bed and agreeable to OT. Family present. Pt engaged in bed level grooming and self-feeding tasks with Min A. OT then instructed in BUE ROM exercises (see details below). Activity tolerance limited this date 2/2 BUE pain with movement and bottom being sore in bed. OT provided Max A for repositioning in bed. Pt left as received with all needs in reach. Pt is making progress toward goal completion. D/C recommendation remains appropriate. OT will continue to follow acutely.     Recommendations for follow up therapy are one component of a multi-disciplinary discharge planning process, led by the attending physician.  Recommendations may be updated based on patient status, additional functional criteria and insurance authorization.    Follow Up Recommendations  Skilled nursing-short term rehab (<3 hours/day)     Assistance Recommended at Discharge Frequent or constant Supervision/Assistance  Patient can return home with the following  Two people to help with walking and/or transfers;A lot of help with bathing/dressing/bathroom   Equipment Recommendations  Hospital bed    Recommendations for Other Services      Precautions / Restrictions Precautions Precautions: Fall Precaution Comments: Sacral wound; aspiration; L UE AV fistula, painful L LE Restrictions Weight  Bearing Restrictions: No       Mobility Bed Mobility Overal bed mobility: Needs Assistance             General bed mobility comments: pt deferred EOB mobility, endorsed bottom being sore and OT provided Max A with repositioning pt in bed using pillows to offload bottom on L side    Transfers           Balance                 ADL either performed or assessed with clinical judgement   ADL Overall ADL's : Needs assistance/impaired Eating/Feeding: Bed level;Minimal assistance Eating/Feeding Details (indicate cue type and reason): granddaughter providing assistance for self-feeding upon arrival, upon request pt able to hold cup and take sips of water with assistance for stabilizing cup Grooming: Minimal assistance;Bed level;Wash/dry face            Extremity/Trunk Assessment Upper Extremity Assessment Upper Extremity Assessment: Generalized weakness   Lower Extremity Assessment Lower Extremity Assessment: Generalized weakness   Cervical / Trunk Assessment Cervical / Trunk Assessment: Kyphotic    Vision Baseline Vision/History: 1 Wears glasses Patient Visual Report: No change from baseline     Perception     Praxis      Cognition Arousal/Alertness: Awake/alert Behavior During Therapy: WFL for tasks assessed/performed Overall Cognitive Status: Within Functional Limits for tasks assessed            Exercises Other Exercises Other Exercises: OT instructed pt in BUE ROM exercises, limited AROM and completed via AAROM. x10 each: digit comp flexion/extension, wrist flexion/extension, elbow flexion/extension, shoulder flexion to ~90 deg. Pt endorsed pain in BUEs with movement.    Shoulder Instructions       General Comments  Pertinent Vitals/ Pain       Pain Assessment Pain Assessment: Faces Faces Pain Scale: Hurts even more Pain Location: BUEs with movement, bottom Pain Descriptors / Indicators: Discomfort, Grimacing, Guarding, Sore Pain  Intervention(s): Limited activity within patient's tolerance, Monitored during session, Repositioned  Home Living         Prior Functioning/Environment              Frequency  Min 2X/week        Progress Toward Goals  OT Goals(current goals can now be found in the care plan section)  Progress towards OT goals: Progressing toward goals  Acute Rehab OT Goals Patient Stated Goal: go to rehab OT Goal Formulation: With patient/family Time For Goal Achievement: 09/21/22 Potential to Achieve Goals: Good  Plan Discharge plan remains appropriate;Frequency remains appropriate    Co-evaluation                 AM-PAC OT "6 Clicks" Daily Activity     Outcome Measure   Help from another person eating meals?: A Little Help from another person taking care of personal grooming?: A Little Help from another person toileting, which includes using toliet, bedpan, or urinal?: Total Help from another person bathing (including washing, rinsing, drying)?: Total Help from another person to put on and taking off regular upper body clothing?: A Lot Help from another person to put on and taking off regular lower body clothing?: A Lot 6 Click Score: 12    End of Session    OT Visit Diagnosis: Unsteadiness on feet (R26.81);Muscle weakness (generalized) (M62.81)   Activity Tolerance Patient tolerated treatment well;Patient limited by pain   Patient Left in bed;with call bell/phone within reach;with family/visitor present   Nurse Communication Mobility status        Time: [id number] OT Time Calculation (min): 13 min  Charges: OT General Charges $OT Visit: 1 Visit OT Treatments $Self Care/Home Management : 8-22 mins  [facility] MS, OTR/L ascom [phone]  09/19/22, 12:07 PM

## 2022-09-20 ENCOUNTER — Inpatient Hospital Stay: Payer: Medicare Other

## 2022-09-20 DIAGNOSIS — I5033 Acute on chronic diastolic (congestive) heart failure: Secondary | ICD-10-CM | POA: Diagnosis not present

## 2022-09-20 DIAGNOSIS — Z515 Encounter for palliative care: Secondary | ICD-10-CM

## 2022-09-20 DIAGNOSIS — Z7189 Other specified counseling: Secondary | ICD-10-CM | POA: Diagnosis not present

## 2022-09-20 LAB — LACTIC ACID, PLASMA: Lactic Acid, Venous: 1 mmol/L (ref 0.5–1.9)

## 2022-09-20 LAB — URINALYSIS, COMPLETE (UACMP) WITH MICROSCOPIC
Bilirubin Urine: NEGATIVE
Glucose, UA: NEGATIVE mg/dL
Hgb urine dipstick: NEGATIVE
Ketones, ur: NEGATIVE mg/dL
Nitrite: NEGATIVE
Protein, ur: 100 mg/dL — AB
Specific Gravity, Urine: 1.011 (ref 1.005–1.030)
WBC, UA: >50 WBC/hpf (ref 0–5)
pH: 5 (ref 5.0–8.0)

## 2022-09-20 LAB — CBC
HCT: 30 % — ABNORMAL LOW (ref 36.0–46.0)
Hemoglobin: 9 g/dL — ABNORMAL LOW (ref 12.0–15.0)
MCH: 30.1 pg (ref 26.0–34.0)
MCHC: 30 g/dL (ref 30.0–36.0)
MCV: 100.3 fL — ABNORMAL HIGH (ref 80.0–100.0)
Platelets: 186 10*3/uL (ref 150–400)
RBC: 2.99 MIL/uL — ABNORMAL LOW (ref 3.87–5.11)
RDW: 17.1 % — ABNORMAL HIGH (ref 11.5–15.5)
WBC: 7.5 10*3/uL (ref 4.0–10.5)
nRBC: 0 % (ref 0.0–0.2)

## 2022-09-20 LAB — BASIC METABOLIC PANEL
Anion gap: 11 (ref 5–15)
BUN: 81 mg/dL — ABNORMAL HIGH (ref 8–23)
CO2: 23 mmol/L (ref 22–32)
Calcium: 8.6 mg/dL — ABNORMAL LOW (ref 8.9–10.3)
Chloride: 113 mmol/L — ABNORMAL HIGH (ref 98–111)
Creatinine, Ser: 3.01 mg/dL — ABNORMAL HIGH (ref 0.44–1.00)
GFR, Estimated: 16 mL/min — ABNORMAL LOW (ref >60)
Glucose, Bld: 111 mg/dL — ABNORMAL HIGH (ref 70–99)
Potassium: 4.5 mmol/L (ref 3.5–5.1)
Sodium: 147 mmol/L — ABNORMAL HIGH (ref 135–145)

## 2022-09-20 MED ORDER — SODIUM CHLORIDE 0.9 % IV SOLN
1.0000 g | INTRAVENOUS | Status: AC
  Administered 2022-09-20 – 2022-09-24 (×5): 1 g via INTRAVENOUS
  Filled 2022-09-20 (×2): qty 1
  Filled 2022-09-20: qty 10
  Filled 2022-09-20 (×2): qty 1

## 2022-09-20 MED ORDER — MORPHINE SULFATE (PF) 2 MG/ML IV SOLN
1.0000 mg | INTRAVENOUS | Status: DC | PRN
  Administered 2022-09-22 – 2022-09-24 (×4): 1 mg via INTRAVENOUS
  Filled 2022-09-20 (×4): qty 1

## 2022-09-20 NOTE — Progress Notes (Signed)
PROGRESS NOTE    Jean Johnson   [id number] DOB: 05-13-1951  DOA: 09/06/2022 Date of Service: 09/20/22 PCP: Gladstone Lighter, MD     Brief Narrative / Hospital Course:  72 year old African-American female with past medical history significant for ESRD s/p renal transplant x 2 now having CKD stage IIIb, hypertension, dyslipidemia, hypothyroidism, PAF on anticoagulation with Eliquis, PUD, type 2 diabetes mellitus with diabetic neuropathy as well as other comorbidities who presented to the ED with acute worsening of her dyspnea. She has had diminished urine output without dysuria urinary frequency or flank pain.  02/27: BP was 172/91 and heart rate 103. EKG Aflutter w/ variable AV block. Chest x-ray showed cardiomegaly with central pulmonary vascular congestion and small bilateral pleural effusions and patchy opacities in the lung bases that may reflect edema or infection. BUN 38 and creatinine 1.33. Was given 4 baby aspirin, 0.4 mg sublingual nitroglycerin and 20 mg of IV Lasix. VQ low probability PE 02/28: cardiology saw patient - continue IV lasix bid, echo done  02/29: renal fxn worsening, lasix to continuous infusion, d/c lisinopril  03/01: Nephrology consulted, lasix drip increased. Echo with moderate LVH, mild AS, and mild-moderate TR, overall similar to prior from 2023    03/02-03/04: Remain on lasix gtt.  03/04: transition from lasix gtt to po  03/05: remained stable on po meds  03/06: Cardiology s/o. Palliative care [place] discussion. PT/OT to see.  03/07-03/10: PT/OT recs for SNF, TOC following, placement pending thru weekend.  03/11 Mon SNF still in process 03/12: elevated temp and tachycardia, Cr slightly up, UA concerning for UTI, CXR no apparent pneumonia but concern for increased pulmonary edema. Caution w/ diuresis, will start ceftriaxone and await UCx, will not discharge today.    Consultants:  Cardiology Palliative Care  Nephrology    Procedures: none      ASSESSMENT & PLAN:   Principal Problem:   Acute on chronic diastolic (congestive) heart failure (HCC) Active Problems:   Hypertensive urgency   Chest pain   Elevated d-dimer   Renal transplant recipient   Dyslipidemia   Paroxysmal atrial fibrillation ([place])   Type 2 diabetes mellitus with chronic kidney disease, with long-term current use of insulin (HCC)   Diabetic neuropathy (HCC)   Acute on chronic diastolic heart failure (HCC)   Protein-calorie malnutrition, severe   Acute on chronic diastolic congestive heart failure - stable Hypertensive urgency - resolved  Pulmonary edema - worsening  Lasix drip to oral Lasix on 09/12/2022 and has remained euvolemic  ACE inhibitor on hold at this time in the setting of increasing creatinine, other GDMT limited by renal function  Strict I's and O's, low-salt diet, daily weights Cardiology s/o 03/06 Nephrology following.   Caution w/ diuresis given renal function    UTI Ceftriaxone started 09/20/22 Await cultures Follow fever curve, VS, CBC Avoiding IV fluids d/t pulmonary edema Monitor BMP  Paroxysmal atrial fibrillation with rapid ventricular response, now rate controlled Eliquis 2.5 mg twice daily Cardizem 120 mg twice daily Given worsening renal function flecainide was switched to amiodarone '200mg'$  BID x 14 days, then '200mg'$  daily thereafter    ESRD s/p renal transplant x 2 Chronic immunosuppression Acute on chronic renal failure likely multifactorial in the setting of diuresis as well as hepatorenal syndrome Still makes urine.  Followed by nephrology outpatient. Baseline creatinine 1.29-1.33 Continue immunosuppressive regimen of CellCept and tacrolimus  Nephrology on board - appreciate recommendations   Elevated troponin likely demand ischemia Dyslipidemia Borderline elevation.  Flat trend  Not indicative of ACS Continue continue aspirin and statin therapy   Diabetic neuropathy (HCC) Continue  with Pregabalin 50 mg p.o. twice daily --> reduced d/t renal function    Type 2 diabetes mellitus with chronic kidney disease, with long-term current use of insulin (HCC) continue insulin therapy Continue to Monitor CBG's per Protocol        DVT prophylaxis: eliquis Pertinent IV fluids/nutrition: no continuous IV fluids, poor po intake but improving  Central lines / invasive devices: none  Code Status: FULL CODE   Current Admission Status: inpatient   TOC needs / Dispo plan: SNF rehab Barriers to discharge / significant pending items: await UCx             Subjective / Brief ROS:  Patient resting comfortably. Husband at bedside   Family Communication: family at bedside on rounds    Objective Findings:  Vitals:   09/19/22 1931 09/19/22 2115 09/20/22 0640 09/20/22 0855  BP: (!) 119/51  139/62 126/69  Pulse: 93  (!) 103 (!) 103  Resp: '16  18 18  '$ Temp: 100.3 F (37.9 C) 98.1 F (36.7 C) (!) 100.4 F (38 C) 99.9 F (37.7 C)  TempSrc: Oral Oral  Oral  SpO2: 94%  97% 95%  Weight:      Height:        Intake/Output Summary (Last 24 hours) at 09/20/2022 1221 Last data filed at 09/19/2022 2000 Gross per 24 hour  Intake 120 ml  Output 475 ml  Net -355 ml   Filed Weights   09/10/22 0600 09/11/22 0334  Weight: 52.7 kg 50.4 kg    Examination:  Physical Exam Constitutional:      General: She is not in acute distress.    Appearance: She is not toxic-appearing.  Cardiovascular:     Rate and Rhythm: Normal rate and regular rhythm.  Pulmonary:     Effort: Pulmonary effort is normal. No tachypnea.     Breath sounds: Decreased breath sounds present.  Abdominal:     Palpations: Abdomen is soft.  Musculoskeletal:     Right lower leg: No edema.     Left lower leg: No edema.  Skin:    General: Skin is warm and dry.  Neurological:     General: No focal deficit present.     Mental Status: She is oriented to person, place, and time. Mental status is at  baseline.          Scheduled Medications:   amiodarone  200 mg Oral BID   Followed by   Derrill Memo ON 09/27/2022] amiodarone  200 mg Oral Daily   apixaban  2.5 mg Oral BID   vitamin C  500 mg Oral BID   atorvastatin  10 mg Oral Daily   diltiazem  60 mg Oral Q12H   docusate sodium  100 mg Oral BID   ezetimibe  10 mg Oral Daily   feeding supplement  237 mL Oral TID BM   furosemide  40 mg Oral BID   multivitamin with minerals  1 tablet Oral Daily   mycophenolate  500 mg Oral BID   mouth rinse  15 mL Mouth Rinse 4 times per day   potassium chloride SA  20 mEq Oral Daily   pregabalin  50 mg Oral Daily   tacrolimus  1 mg Oral BID   zinc sulfate  220 mg Oral Daily    Continuous Infusions:  cefTRIAXone (ROCEPHIN)  IV      PRN Medications:  acetaminophen **  OR** acetaminophen, iohexol, labetalol, morphine injection, nitroGLYCERIN, ondansetron **OR** ondansetron (ZOFRAN) IV, mouth rinse, traZODone  Antimicrobials from admission:  Anti-infectives (From admission, onward)    Start     Dose/Rate Route Frequency Ordered Stop   09/20/22 1215  cefTRIAXone (ROCEPHIN) 1 g in sodium chloride 0.9 % 100 mL IVPB        1 g 200 mL/hr over 30 Minutes Intravenous Every 24 hours 09/20/22 1125             Data Reviewed:  I have personally reviewed the following...  CBC: Recent Labs  Lab 09/14/22 0340 09/18/22 0555 09/20/22 0932  WBC 7.3 7.5 7.5  NEUTROABS 5.4  --   --   HGB 9.2* 9.3* 9.0*  HCT 28.8* 30.6* 30.0*  MCV 95.4 101.0* 100.3*  PLT 135* 165 [id number]   Basic Metabolic Panel: Recent Labs  Lab 09/14/22 0340 09/15/22 0527 09/16/22 0516 09/18/22 0555 09/19/22 0658 09/20/22 0932  NA 142 143  --  143 143 147*  K 4.0 4.0 4.2 5.1 4.9 4.5  CL 109 110  --  111 111 113*  CO2 24 24  --  '24 24 23  '$ GLUCOSE 83 88  --  100* 80 111*  BUN 52* 56*  --  71* 73* 81*  CREATININE 2.75* 2.83*  --  2.94* 3.03* 3.01*  CALCIUM 8.2* 8.3*  --  8.5* 8.6* 8.6*  MG  --   --  1.8 2.1  --   --    PHOS  --   --  4.4 4.6  --   --    GFR: Estimated Creatinine Clearance: 13.2 mL/min (A) (by C-G formula based on SCr of 3.01 mg/dL (H)). Liver Function Tests: No results for input(s): "AST", "ALT", "ALKPHOS", "BILITOT", "PROT", "ALBUMIN" in the last 168 hours.  No results for input(s): "LIPASE", "AMYLASE" in the last 168 hours. No results for input(s): "AMMONIA" in the last 168 hours. Coagulation Profile: No results for input(s): "INR", "PROTIME" in the last 168 hours. Cardiac Enzymes: No results for input(s): "CKTOTAL", "CKMB", "CKMBINDEX", "TROPONINI" in the last 168 hours. BNP (last 3 results) No results for input(s): "PROBNP" in the last 8760 hours. HbA1C: No results for input(s): "HGBA1C" in the last 72 hours. CBG: Recent Labs  Lab 09/18/22 1143 09/18/22 1656 09/18/22 1826 09/18/22 2114 09/19/22 2054  GLUCAP 160* 50* 122* 74 148*   Lipid Profile: No results for input(s): "CHOL", "HDL", "LDLCALC", "TRIG", "CHOLHDL", "LDLDIRECT" in the last 72 hours. Thyroid Function Tests: No results for input(s): "TSH", "T4TOTAL", "FREET4", "T3FREE", "THYROIDAB" in the last 72 hours. Anemia Panel: No results for input(s): "VITAMINB12", "FOLATE", "FERRITIN", "TIBC", "IRON", "RETICCTPCT" in the last 72 hours. Most Recent Urinalysis On File:     Component Value Date/Time   COLORURINE YELLOW (A) 09/20/2022 0944   APPEARANCEUR CLOUDY (A) 09/20/2022 0944   APPEARANCEUR Hazy (A) 11/16/2020 1344   LABSPEC 1.011 09/20/2022 0944   PHURINE 5.0 09/20/2022 0944   GLUCOSEU NEGATIVE 09/20/2022 0944   HGBUR NEGATIVE 09/20/2022 0944   BILIRUBINUR NEGATIVE 09/20/2022 0944   BILIRUBINUR Negative 11/16/2020 1344   KETONESUR NEGATIVE 09/20/2022 0944   PROTEINUR 100 (A) 09/20/2022 0944   NITRITE NEGATIVE 09/20/2022 0944   LEUKOCYTESUR LARGE (A) 09/20/2022 0944   Sepsis Labs: '@LABRCNTIP'$ (procalcitonin:4,lacticidven:4) Microbiology: No results found for this or any previous visit (from the past 240  hour(s)).    Radiology Studies last 3 days: DG Chest Port 1 View  Result Date: 09/20/2022 CLINICAL DATA:  Fever. EXAM: PORTABLE CHEST  1 VIEW COMPARISON:  Chest x-ray dated September 08, 2022. FINDINGS: Chronic cardiomegaly. Mild diffuse interstitial and hazy airspace opacities throughout both lungs have worsened in the interval. Unchanged layering small right pleural effusion with right basilar atelectasis. Decreased now small layering left pleural effusion with continued left lower lobe atelectasis. No pneumothorax. No acute osseous abnormality. IMPRESSION: 1. Worsening pulmonary edema. 2. Decreased now small left pleural effusion. Unchanged small right pleural effusion. Electronically Signed   By: Titus Dubin M.D.   On: 09/20/2022 09:01             LOS: 14 days       Emeterio Reeve, DO [facility] 09/20/2022, 12:21 PM    Dictation software may have been used to generate the above note. Typos may occur and escape review in typed/dictated notes. Please contact Dr Sheppard Coil directly for clarity if needed.  Staff may message me via secure chat in [place]  but this may not receive an immediate response,  please page me for urgent matters!  If 7PM-7AM, please contact night coverage [email or web address]

## 2022-09-20 NOTE — Plan of Care (Signed)
Patient Jean Johnson, disoriented to time and forgetful.  VSS throughout shift.  All meds given on time as ordered.  Diminished lungs, IS encouraged.  Pure[place] remains in place.  POC maintained, will continue to monitor.  Problem: Education: Goal: Ability to demonstrate management of disease process will improve Outcome: Progressing Goal: Ability to verbalize understanding of medication therapies will improve Outcome: Progressing Goal: Individualized Educational Video(s) Outcome: Progressing   Problem: Activity: Goal: Capacity to carry out activities will improve Outcome: Progressing   Problem: Cardiac: Goal: Ability to achieve and maintain adequate cardiopulmonary perfusion will improve Outcome: Progressing   Problem: Education: Goal: Ability to describe self-care measures that may prevent or decrease complications (Diabetes Survival Skills Education) will improve Outcome: Progressing Goal: Individualized Educational Video(s) Outcome: Progressing   Problem: Coping: Goal: Ability to adjust to condition or change in health will improve Outcome: Progressing   Problem: Fluid Volume: Goal: Ability to maintain a balanced intake and output will improve Outcome: Progressing   Problem: Health Behavior/Discharge Planning: Goal: Ability to identify and utilize available resources and services will improve Outcome: Progressing Goal: Ability to manage health-related needs will improve Outcome: Progressing   Problem: Metabolic: Goal: Ability to maintain appropriate glucose levels will improve Outcome: Progressing   Problem: Nutritional: Goal: Maintenance of adequate nutrition will improve Outcome: Progressing Goal: Progress toward achieving an optimal weight will improve Outcome: Progressing   Problem: Skin Integrity: Goal: Risk for impaired skin integrity will decrease Outcome: Progressing   Problem: Tissue Perfusion: Goal: Adequacy of tissue perfusion will improve Outcome:  Progressing   Problem: Education: Goal: Knowledge of General Education information will improve Description: Including pain rating scale, medication(s)/side effects and non-pharmacologic comfort measures Outcome: Progressing   Problem: Health Behavior/Discharge Planning: Goal: Ability to manage health-related needs will improve Outcome: Progressing   Problem: Clinical Measurements: Goal: Ability to maintain clinical measurements within normal limits will improve Outcome: Progressing Goal: Will remain free from infection Outcome: Progressing Goal: Diagnostic test results will improve Outcome: Progressing Goal: Respiratory complications will improve Outcome: Progressing Goal: Cardiovascular complication will be avoided Outcome: Progressing   Problem: Activity: Goal: Risk for activity intolerance will decrease Outcome: Progressing   Problem: Nutrition: Goal: Adequate nutrition will be maintained Outcome: Progressing   Problem: Coping: Goal: Level of anxiety will decrease Outcome: Progressing   Problem: Elimination: Goal: Will not experience complications related to bowel motility Outcome: Progressing Goal: Will not experience complications related to urinary retention Outcome: Progressing   Problem: Pain Managment: Goal: General experience of comfort will improve Outcome: Progressing   Problem: Safety: Goal: Ability to remain free from injury will improve Outcome: Progressing   Problem: Skin Integrity: Goal: Risk for impaired skin integrity will decrease Outcome: Progressing

## 2022-09-20 NOTE — Progress Notes (Signed)
[facility]  ROUNDING NOTE   Subjective:   Jean Johnson is a 72 y.o. female with past medical conditions including diabetes, atrial fibrillation on Eliquis, dyslipidemia, hypertension, end-stage renal disease status post renal transplant x 2, diabetic neuropathy, and PUD.  Patient presents to the emergency department with complaints of shortness of breath and chest pain.  Patient has been admitted for Acute on chronic diastolic heart failure (HCC) [I50.33] Acute on chronic diastolic (congestive) heart failure (HCC) [I50.33] Dyspnea, unspecified type [R06.00]  Patient is known to our practice and is followed outpatient by Dr. Lanora Manis.  Patient was last seen in office on 08/23/2022 for routine follow-up.    Patient seen sitting up in bed, husband at bedside States she feels well today Denies nausea or vomiting Denies pain or discomfort  Objective:  Vital signs in last 24 hours:  Temp:  [98.1 F (36.7 C)-100.4 F (38 C)] 99.9 F (37.7 C) (03/12 0855) Pulse Rate:  [93-108] 103 (03/12 0855) Resp:  [16-20] 18 (03/12 0855) BP: (119-139)/(51-78) 126/69 (03/12 0855) SpO2:  [91 %-97 %] 95 % (03/12 0855)  Weight change:  Filed Weights   09/10/22 0600 09/11/22 0334  Weight: 52.7 kg 50.4 kg    Intake/Output: I/O last 3 completed shifts: In: 120 [P.O.:120] Out: 975 [Urine:975]   Intake/Output this shift:  No intake/output data recorded.  Physical Exam: General: NAD  Head: Normocephalic, atraumatic. Moist oral mucosal membranes  Eyes: Anicteric  Lungs:  Clear to auscultation, normal effort  Heart: Regular rate and rhythm  Abdomen:  Soft, nontender, nondistended  Extremities: No peripheral edema.  Neurologic: Nonfocal, moving all four extremities  Skin: No lesions  Access: None    Basic Metabolic Panel: Recent Labs  Lab 09/14/22 0340 09/15/22 0527 09/16/22 0516 09/18/22 0555 09/19/22 0658 09/20/22 0932  NA 142 143  --  143 143 147*  K 4.0 4.0 4.2  5.1 4.9 4.5  CL 109 110  --  111 111 113*  CO2 24 24  --  '24 24 23  '$ GLUCOSE 83 88  --  100* 80 111*  BUN 52* 56*  --  71* 73* 81*  CREATININE 2.75* 2.83*  --  2.94* 3.03* 3.01*  CALCIUM 8.2* 8.3*  --  8.5* 8.6* 8.6*  MG  --   --  1.8 2.1  --   --   PHOS  --   --  4.4 4.6  --   --      Liver Function Tests: No results for input(s): "AST", "ALT", "ALKPHOS", "BILITOT", "PROT", "ALBUMIN" in the last 168 hours.  No results for input(s): "LIPASE", "AMYLASE" in the last 168 hours. No results for input(s): "AMMONIA" in the last 168 hours.  CBC: Recent Labs  Lab 09/14/22 0340 09/18/22 0555 09/20/22 0932  WBC 7.3 7.5 7.5  NEUTROABS 5.4  --   --   HGB 9.2* 9.3* 9.0*  HCT 28.8* 30.6* 30.0*  MCV 95.4 101.0* 100.3*  PLT 135* 165 186     Cardiac Enzymes: No results for input(s): "CKTOTAL", "CKMB", "CKMBINDEX", "TROPONINI" in the last 168 hours.  BNP: Invalid input(s): "POCBNP"  CBG: Recent Labs  Lab 09/18/22 1143 09/18/22 1656 09/18/22 1826 09/18/22 2114 09/19/22 2054  GLUCAP 160* 50* 122* 37 148*     Microbiology: Results for orders placed or performed during the hospital encounter of 08/29/22  Surgical pcr screen     Status: None   Collection Time: 08/29/22 11:08 AM   Specimen: Nasal Mucosa; Nasal Swab  Result Value Ref Range Status   MRSA, PCR NEGATIVE NEGATIVE Final   Staphylococcus aureus NEGATIVE NEGATIVE Final    Comment: (NOTE) The Xpert SA Assay (FDA approved for NASAL specimens in patients 15 years of age and older), is one component of a comprehensive surveillance program. It is not intended to diagnose infection nor to guide or monitor treatment. Performed at [facility], [address] [place], [place] [address]     Coagulation Studies: No results for input(s): "LABPROT", "INR" in the last 72 hours.  Urinalysis: Recent Labs    09/20/22 0944  COLORURINE YELLOW*  LABSPEC 1.011  PHURINE 5.0  GLUCOSEU NEGATIVE  HGBUR NEGATIVE   BILIRUBINUR NEGATIVE  KETONESUR NEGATIVE  PROTEINUR 100*  NITRITE NEGATIVE  LEUKOCYTESUR LARGE*       Imaging: DG Chest Port 1 View  Result Date: 09/20/2022 CLINICAL DATA:  Fever. EXAM: PORTABLE CHEST 1 VIEW COMPARISON:  Chest x-ray dated September 08, 2022. FINDINGS: Chronic cardiomegaly. Mild diffuse interstitial and hazy airspace opacities throughout both lungs have worsened in the interval. Unchanged layering small right pleural effusion with right basilar atelectasis. Decreased now small layering left pleural effusion with continued left lower lobe atelectasis. No pneumothorax. No acute osseous abnormality. IMPRESSION: 1. Worsening pulmonary edema. 2. Decreased now small left pleural effusion. Unchanged small right pleural effusion. Electronically Signed   By: Titus Dubin M.D.   On: 09/20/2022 09:01     Medications:    cefTRIAXone (ROCEPHIN)  IV       amiodarone  200 mg Oral BID   Followed by   Derrill Memo ON 09/27/2022] amiodarone  200 mg Oral Daily   apixaban  2.5 mg Oral BID   vitamin C  500 mg Oral BID   atorvastatin  10 mg Oral Daily   diltiazem  60 mg Oral Q12H   docusate sodium  100 mg Oral BID   ezetimibe  10 mg Oral Daily   feeding supplement  237 mL Oral TID BM   furosemide  40 mg Oral BID   multivitamin with minerals  1 tablet Oral Daily   mycophenolate  500 mg Oral BID   mouth rinse  15 mL Mouth Rinse 4 times per day   potassium chloride SA  20 mEq Oral Daily   pregabalin  50 mg Oral Daily   tacrolimus  1 mg Oral BID   zinc sulfate  220 mg Oral Daily   acetaminophen **OR** acetaminophen, iohexol, labetalol, morphine injection, nitroGLYCERIN, ondansetron **OR** ondansetron (ZOFRAN) IV, mouth rinse, traZODone  Assessment/ Plan:  [name] is a 72 y.o.  female  with past medical conditions including diabetes, atrial fibrillation on Eliquis, dyslipidemia, hypertension, end-stage renal disease status post renal transplant x 2, diabetic neuropathy,  and PUD.  Patient presents to the emergency department with complaints of shortness of breath and chest pain.  Patient has been admitted for Acute on chronic diastolic heart failure (HCC) [I50.33] Acute on chronic diastolic (congestive) heart failure (HCC) [I50.33] Dyspnea, unspecified type [R06.00]   Acute Kidney Injury on chronic kidney disease stage IIIAt with baseline creatinine 1.27 and GFR of 45 on 08/18/22.  Acute kidney injury secondary to cardiorenal syndrome Chronic kidney disease is secondary to diabetes, hypertension and CHF. No exposure to IV contrast. Furosemide drip started in ED.   Creatinine appears stable today.  Questionable urine output.  Will continue diuretic as prescribed.  Continue tacrolimus 1 mg twice daily with mycophenolate as ordered.  Will require a follow-up appointment  in our office at discharge.  Lab Results  Component Value Date   CREATININE 3.01 (H) 09/20/2022   CREATININE 3.03 (H) 09/19/2022   CREATININE 2.94 (H) 09/18/2022    Intake/Output Summary (Last 24 hours) at 09/20/2022 1158 Last data filed at 09/19/2022 2000 Gross per 24 hour  Intake 120 ml  Output 475 ml  Net -355 ml    2. Anemia of chronic kidney disease  Lab Results  Component Value Date   HGB 9.0 (L) 09/20/2022    Hemoglobin stable  3. Secondary Hyperparathyroidism: with outpatient labs: PTH 141, phosphorus 3.2, calcium 9.7 on 08/18/22.   Lab Results  Component Value Date   CALCIUM 8.6 (L) 09/20/2022   CAION 1.25 08/30/2022   PHOS 4.6 09/18/2022    Bone minerals acceptable at this time.  4. Diabetes mellitus type II with chronic kidney disease/renal manifestations: insulin/noninsulin dependent. Home regimen includes Humalog and levemir. Most recent hemoglobin A1c is 5.7 on 04/14/22.   Glucose well-controlled.  Sliding scale insulin managed by primary team.  5. Acute on Chronic diastolic heart failure. ECHO on 09/08/22 shows EF 60-65% with a grade 1 diastolic dysfunction, mild AVR  with stenosis.   Cotninue Furosemide to '40mg'$  BID.  Adequate results noted.  6.  Urinary tract infection, UA noted with leukocytes and patient febrile.  Primary team has ordered ceftriaxone.   LOS: [place] 3/12/202411:58 AM

## 2022-09-20 NOTE — Progress Notes (Signed)
Nutrition Follow-up  DOCUMENTATION CODES:   Severe malnutrition in context of chronic illness  INTERVENTION:   Ensure Enlive po TID, each supplement provides 350 kcal and 20 grams of protein.  Magic cup TID with meals, each supplement provides 290 kcal and 9 grams of protein  MVI po daily   Vitamin C '500mg'$  po BID  Zinc '220mg'$  po daily x 14 days  Assist with meals  Pt at high refeed risk; recommend monitor potassium, magnesium and phosphorus labs daily until stable  Daily weights   NUTRITION DIAGNOSIS:   Severe Malnutrition related to chronic illness (ESRD s/p renal transplant x 2) as evidenced by moderate fat depletion, severe fat depletion, moderate muscle depletion, severe muscle depletion, edema. -ongoing   GOAL:   Patient will meet greater than or equal to 90% of their needs -not met   MONITOR:   PO intake, Supplement acceptance, Labs, Weight trends, I & O's, Skin  ASSESSMENT:   72 y/o female with h/o CHF, renal transplant x 2 on chronic immunosuppression, now CKD III, HLD, PAF, DM, neuropathy, HTN, hypothyroidism, PUD, perianal fistula, kidney stones, spondylosis s/p spinal fusion who is admitted with CHF.  Pt with poor appetite and oral intake since admission; pt documented to be eating <50% of meals. Pt does appear to be doing better with the pureed diet with meal assistance. Pt documented to have eaten 25% of lunch today and 50% of dinner last night. Pt is drinking some of the Ensure supplements. Pt with worsening pulmonary edema and new UTI. Discharge cancelled. Pt has not been weighed since 3/3; pt is ordered for daily weights. No BM since 3/9. Plan is for SNF at discharge.   Medications reviewed and include: vitamin C, colace, lasix, insulin, MVI, cellcept, Kcl, zinc, ceftriaxone   Labs reviewed: Na 147(H), K 4.5 wnl, BUN 81(H), creat 3.01(H) P 4.6 wnl, Mg 2.1 wnl- 3/10 Hgb 9.0(L), Hct 30.0(L)  Diet Order:   Diet Order             DIET - DYS 1 Room  service appropriate? Yes with Assist; Fluid consistency: Thin  Diet effective now                  EDUCATION NEEDS:   Education needs have been addressed  Skin:  Skin Assessment: Reviewed RN Assessment (Stage III sacrum- 5cm x 3cm x 0.5cm)  Last BM:  3/9- type 5  Height:   Ht Readings from Last 1 Encounters:  09/06/22 '5\' 2"'$  (1.575 m)    Weight:   Wt Readings from Last 1 Encounters:  08/30/22 49.4 kg    Ideal Body Weight:  50 kg  BMI:  Body mass index is 20.32 kg/m.  Estimated Nutritional Needs:   Kcal:  1400-1600kcal/day  Protein:  70-80g/day  Fluid:  1.3-1.5L/day  Koleen Distance MS, RD, LDN Please refer to [facility] for RD and/or RD on-call/weekend/after hours pager

## 2022-09-20 NOTE — TOC Progression Note (Signed)
Transition of Care [facility]) - Progression Note    Patient Details  Name: Paolina Kukulski MRN: [id number] Date of Birth: 1951-06-13  Transition of Care [facility]) CM/SW Contact  Beverly Sessions, RN Phone Number: 09/20/2022, 3:20 PM  Clinical Narrative:    Per MD not medically ready for discharge today Sharyn Lull with [facility] place updated    Expected Discharge Plan: [place] Barriers to Discharge: Continued Medical Work up  Expected Discharge Plan and [place] arrangements for the past 2 months: [place]: [place] ([place]) Date [facility] Agency Contacted: 09/08/22 Time [place]: [place] Representative spoke with at [place]: [place] ([place]) Interventions SDOH Screenings   Food Insecurity: No Food Insecurity (09/07/2022)  Housing: Low Risk  (09/07/2022)  Transportation Needs: No Transportation Needs (09/07/2022)  Utilities: Not At Risk (09/07/2022)  Tobacco Use: Low Risk  (09/10/2022)    Readmission Risk Interventions    09/08/2022    4:23 PM 04/27/2022    3:34 PM  Readmission Risk Prevention Plan  Transportation Screening Complete Complete  HRI or Home Care Consult  Complete  Social Work Consult for [place] Planning/Counseling  Complete  Palliative Care Screening  Not Applicable  Medication Review Press photographer) Complete Complete  PCP or Specialist appointment within 3-5 days of discharge Complete   HRI or [place] Complete   SW Recovery Care/Counseling Consult Complete   Palliative Care Screening Not Complete   Skilled Nursing Facility Complete

## 2022-09-20 NOTE — Progress Notes (Addendum)
Palliative: Chart review completed.  [name] was to discharge today to short-term rehab at [facility], but this is delayed dt UTI.  At this point continue full scope/full code.  [name] has experienced acute kidney injury on CKD secondary to cardiorenal syndrome.  She has been followed by nephrology while inpatient and sees Dr. Lanora Johnson outpatient.  She is now noted to have a urinary tract infection, and is being treated.  Conference with attending, nephrology, bedside nursing staff, transition of care team related to patient condition, needs, goals of care, disposition.  Plan: Continue full scope/full code.  Short-term rehab at [facility].  Continue with outpatient nephrology support.     No charge Jean Axe, NP Palliative medicine team Team phone [phone] Greater than 50% of this time was spent counseling and coordinating care related to the above assessment and plan.

## 2022-09-21 ENCOUNTER — Ambulatory Visit (INDEPENDENT_AMBULATORY_CARE_PROVIDER_SITE_OTHER): Payer: Medicare Other | Admitting: Nurse Practitioner

## 2022-09-21 DIAGNOSIS — I5033 Acute on chronic diastolic (congestive) heart failure: Secondary | ICD-10-CM | POA: Diagnosis not present

## 2022-09-21 LAB — CBC WITH DIFFERENTIAL/PLATELET
Abs Immature Granulocytes: 0.07 10*3/uL (ref 0.00–0.07)
Basophils Absolute: 0 10*3/uL (ref 0.0–0.1)
Basophils Relative: 0 %
Eosinophils Absolute: 0.1 10*3/uL (ref 0.0–0.5)
Eosinophils Relative: 1 %
HCT: 29.7 % — ABNORMAL LOW (ref 36.0–46.0)
Hemoglobin: 9 g/dL — ABNORMAL LOW (ref 12.0–15.0)
Immature Granulocytes: 1 %
Lymphocytes Relative: 10 %
Lymphs Abs: 0.9 10*3/uL (ref 0.7–4.0)
MCH: 30.3 pg (ref 26.0–34.0)
MCHC: 30.3 g/dL (ref 30.0–36.0)
MCV: 100 fL (ref 80.0–100.0)
Monocytes Absolute: 0.7 10*3/uL (ref 0.1–1.0)
Monocytes Relative: 7 %
Neutro Abs: 8 10*3/uL — ABNORMAL HIGH (ref 1.7–7.7)
Neutrophils Relative %: 81 %
Platelets: 205 10*3/uL (ref 150–400)
RBC: 2.97 MIL/uL — ABNORMAL LOW (ref 3.87–5.11)
RDW: 17.2 % — ABNORMAL HIGH (ref 11.5–15.5)
WBC: 9.8 10*3/uL (ref 4.0–10.5)
nRBC: 0 % (ref 0.0–0.2)

## 2022-09-21 LAB — CBC
HCT: 29.8 % — ABNORMAL LOW (ref 36.0–46.0)
Hemoglobin: 9.2 g/dL — ABNORMAL LOW (ref 12.0–15.0)
MCH: 30.5 pg (ref 26.0–34.0)
MCHC: 30.9 g/dL (ref 30.0–36.0)
MCV: 98.7 fL (ref 80.0–100.0)
Platelets: 210 10*3/uL (ref 150–400)
RBC: 3.02 MIL/uL — ABNORMAL LOW (ref 3.87–5.11)
RDW: 17.2 % — ABNORMAL HIGH (ref 11.5–15.5)
WBC: 9.2 10*3/uL (ref 4.0–10.5)
nRBC: 0 % (ref 0.0–0.2)

## 2022-09-21 LAB — BASIC METABOLIC PANEL
Anion gap: 9 (ref 5–15)
BUN: 93 mg/dL — ABNORMAL HIGH (ref 8–23)
CO2: 24 mmol/L (ref 22–32)
Calcium: 8.7 mg/dL — ABNORMAL LOW (ref 8.9–10.3)
Chloride: 115 mmol/L — ABNORMAL HIGH (ref 98–111)
Creatinine, Ser: 3.32 mg/dL — ABNORMAL HIGH (ref 0.44–1.00)
GFR, Estimated: 14 mL/min — ABNORMAL LOW (ref >60)
Glucose, Bld: 88 mg/dL (ref 70–99)
Potassium: 4.1 mmol/L (ref 3.5–5.1)
Sodium: 148 mmol/L — ABNORMAL HIGH (ref 135–145)

## 2022-09-21 LAB — GLUCOSE, CAPILLARY: Glucose-Capillary: 98 mg/dL (ref 70–99)

## 2022-09-21 MED ORDER — FUROSEMIDE 40 MG PO TABS
40.0000 mg | ORAL_TABLET | Freq: Every day | ORAL | Status: DC
  Administered 2022-09-22 – 2022-09-24 (×3): 40 mg via ORAL
  Filled 2022-09-21 (×3): qty 1

## 2022-09-21 NOTE — Progress Notes (Signed)
Progress Note   Patient: Jean Johnson [id number] DOB: Mar 02, 1951 DOA: 09/06/2022     15 DOS: the patient was seen and examined on 09/21/2022    Subjective: Patient seen and examined at bedside this morning in the presence of her nurse He did have a temperature spike overnight She appears lethargic She denies nausea vomiting abdominal pain or chest pain  Brief Narrative / Hospital Course:  72 year old African-American female with past medical history significant for ESRD s/p renal transplant x 2 now having CKD stage IIIb, hypertension, dyslipidemia, hypothyroidism, PAF on anticoagulation with Eliquis, PUD, type 2 diabetes mellitus with diabetic neuropathy as well as other comorbidities who presented to the ED with acute worsening of her dyspnea. She has had diminished urine output without dysuria urinary frequency or flank pain.  02/27: BP was 172/91 and heart rate 103. EKG Aflutter w/ variable AV block. Chest x-ray showed cardiomegaly with central pulmonary vascular congestion and small bilateral pleural effusions and patchy opacities in the lung bases that may reflect edema or infection. BUN 38 and creatinine 1.33. Was given 4 baby aspirin, 0.4 mg sublingual nitroglycerin and 20 mg of IV Lasix. VQ low probability PE 02/28: cardiology saw patient - continue IV lasix bid, echo done  02/29: renal fxn worsening, lasix to continuous infusion, d/c lisinopril  03/01: Nephrology consulted, lasix drip increased. Echo with moderate LVH, mild AS, and mild-moderate TR, overall similar to prior from 2023    03/02-03/04: Remain on lasix gtt.  03/04: transition from lasix gtt to po  03/05: remained stable on po meds  03/06: Cardiology s/o. Palliative care [place] discussion. PT/OT to see.  03/07-03/10: PT/OT recs for SNF, TOC following, placement pending thru weekend.  03/11 Mon SNF still in process 03/12: elevated temp and tachycardia, Cr slightly up, UA concerning for UTI, CXR no apparent pneumonia  but concern for increased pulmonary edema. Caution w/ diuresis, will start ceftriaxone and await UCx, will not discharge today.      Consultants:  Cardiology Palliative Care  Nephrology    Procedures: none           ASSESSMENT & PLAN:   Principal Problem:   Acute on chronic diastolic (congestive) heart failure (HCC) Active Problems:   Hypertensive urgency   Chest pain   Elevated d-dimer   Renal transplant recipient   Dyslipidemia   Paroxysmal atrial fibrillation ([place])   Type 2 diabetes mellitus with chronic kidney disease, with long-term current use of insulin (HCC)   Diabetic neuropathy (HCC)   Acute on chronic diastolic heart failure (HCC)   Protein-calorie malnutrition, severe     Acute on chronic diastolic congestive heart failure - stable Hypertensive urgency - resolved  Pulmonary edema - worsening  Lasix drip to oral Lasix on 09/12/2022 and has remained euvolemic  ACE inhibitor on hold at this time in the setting of increasing creatinine, other GDMT limited by renal function  Strict I's and O's, low-salt diet, daily weights Cardiology s/o 03/06 Nephrology following.   Caution w/ diuresis given renal function    UTI Ceftriaxone started 09/20/22 Urine culture results showing E. coli sensitivity pending Follow fever curve, VS, CBC Avoiding IV fluids d/t pulmonary edema Monitor BMP   Paroxysmal atrial fibrillation with rapid ventricular response, now rate controlled Eliquis 2.5 mg twice daily Cardizem 120 mg twice daily Given worsening renal function flecainide was switched to amiodarone '200mg'$  BID x 14 days, then '200mg'$  daily thereafter    ESRD s/p renal transplant x 2 Chronic immunosuppression Acute  on chronic renal failure likely multifactorial in the setting of diuresis as well as hepatorenal syndrome Still makes urine.  Followed by nephrology outpatient. Baseline creatinine 1.29-1.33 Continue immunosuppressive regimen of CellCept and tacrolimus  Dose of  Lasix as well as tacrolimus have been decreased by nephrologist Nephrology on board - appreciate recommendations   Elevated troponin likely demand ischemia Dyslipidemia Borderline elevation.  Flat trend Not indicative of ACS Continue continue aspirin and statin therapy   Diabetic neuropathy (HCC) Continue with Pregabalin 50 mg p.o. twice daily --> reduced d/t renal function    Type 2 diabetes mellitus with chronic kidney disease, with long-term current use of insulin (HCC) continue insulin therapy Continue to Monitor CBG's per Protocol       DVT prophylaxis: eliquis Pertinent IV fluids/nutrition: no continuous IV fluids, poor po intake but improving  Central lines / invasive devices: none   Code Status: FULL CODE    Current Admission Status: inpatient   TOC needs / Dispo plan: SNF rehab Barriers to discharge / significant pending items: Being actively managed for fever and UTI       Physical Exam Constitutional:      General: She is not in acute distress.    Appearance: She is not toxic-appearing.  Cardiovascular:     Rate and Rhythm: Normal rate and regular rhythm.  Pulmonary:     Effort: Pulmonary effort is normal. No tachypnea.     Breath sounds: Decreased bilaterally especially at the bases Abdominal:     Palpations: Abdomen is soft.  Musculoskeletal:     Right lower leg: No edema.     Left lower leg: No edema.  Skin:    General: Skin is warm and dry.  Neurological:     General: No focal deficit present.     Mental Status: She is oriented to person, place, and time. Mental status is at baseline.       Data Reviewed: Labs reviewed showing worsening kidney function  Family Communication: No family present at bedside  Disposition: Status is: Inpatient   Planned Discharge Destination: To nursing facility  Time spent: 38 minutes       Vitals:   09/20/22 1814 09/21/22 0447 09/21/22 0812 09/21/22 1530  BP: (!) 110/47 (!) 128/59 128/66 124/65  Pulse:  82 96 93 88  Resp: '18 17 17 16  '$ Temp: 98.4 F (36.9 C) (!) 100.6 F (38.1 C) 99.6 F (37.6 C) 99.4 F (37.4 C)  TempSrc:      SpO2: 100% 100% 100% 100%  Weight:      Height:        Author: Verline Lema, MD 09/21/2022 3:43 PM  For on call review [email or web address].

## 2022-09-21 NOTE — Evaluation (Signed)
Occupational Therapy Re-Evaluation Patient Details Name: Jean Johnson MRN: [id number] DOB: Apr 19, 1951 Today's Date: 09/21/2022   History of Present Illness Jean Johnson is a 72 y.o. African-American female with medical history significant for end-stage renal disease s/p renal transplant x2, hypertension, dyslipidemia, hypothyroidism, paroxysmal atrial fibrillation on Eliquis, PUD, type 2 diabetes mellitus with diabetic neuropathy, who presented to the emergency room with acute onset of worsening dyspnea with associated chest pain which has been progressively worse over the last week.   Clinical Impression   Pt received semi-reclined in bed, leaning heavily L with body and head. Appearing fatigued; willing to work with OT on eating, with encouragement. See flowsheet below for further details of session. Left semi-reclined in bed with all needs in reach.  Patient will benefit from continued OT while in acute care.       Recommendations for follow up therapy are one component of a multi-disciplinary discharge planning process, led by the attending physician.  Recommendations may be updated based on patient status, additional functional criteria and insurance authorization.   Follow Up Recommendations  Skilled nursing-short term rehab (<3 hours/day)     Assistance Recommended at Discharge Frequent or constant Supervision/Assistance  Patient can return home with the following Two people to help with walking and/or transfers;A lot of help with bathing/dressing/bathroom    Functional Status Assessment     Equipment Recommendations  Hospital bed    Recommendations for Other Services       Precautions / Restrictions Precautions Precautions: Fall;Other (comment) Precaution Comments: Sacral wound; aspiration; L UE AV fistula, painful L LE Restrictions Weight Bearing Restrictions: No      Mobility Bed Mobility               General bed mobility comments: deferred  this session; PT pending; pt in pain today. Dependent for repositioning in bed. Pt with very strong L lean in bed.    Transfers                          Balance                                           ADL either performed or assessed with clinical judgement   ADL   Eating/Feeding: Bed level;Moderate assistance Eating/Feeding Details (indicate cue type and reason): assist for making built up spoon handle; assist for loading magic cup onto spoon; cues for bringing spoon to mouth. Dependent for holding coffee cup.   Grooming Details (indicate cue type and reason): With cue to use the towel that was on her chest, pt was able to wipe her mouth lightly; not very thorough.                               General ADL Comments: Prior OT assessments pt was MAX A x2 to the edge of the bed.     Vision         Perception     Praxis      Pertinent Vitals/Pain Pain Assessment Pain Assessment: 0-10 Pain Score:  (did not rate) Pain Location: appears generalized; noticed L side pain with movement Pain Descriptors / Indicators: Discomfort, Grimacing, Guarding, Sore     Hand Dominance Right   Extremity/Trunk Assessment Upper Extremity Assessment Upper Extremity Assessment:  RUE deficits/detail;LUE deficits/detail RUE Deficits / Details: needs cues to use RUE for eating; Pt able to use RUE to self-feed with very weak hand grip; OT placed built-up handle on spoon. Dependent for holding cup of coffee. LUE Deficits / Details: appears painful; weak   Lower Extremity Assessment Lower Extremity Assessment: Defer to PT evaluation   Cervical / Trunk Assessment Cervical / Trunk Assessment: Kyphotic   Communication Communication Communication: No difficulties   Cognition Arousal/Alertness: Awake/alert, Lethargic Behavior During Therapy: Flat affect Overall Cognitive Status: Within Functional Limits for tasks assessed                                  General Comments: needs step by step cues; did not carry over from one bite to the next with the rhythm of how she was eating today     General Comments  Pt appearing with decreased motivation today; needing encouragement just to eat.    Exercises     Shoulder Instructions      Home Living Family/patient expects to be discharged to:: Private residence Living Arrangements: Spouse/significant other Available Help at Discharge: Family;Available 24 hours/day Type of Home: House Home Access: Level entry     Home Layout: Bed/bath upstairs;1/2 bath on main level;Two level Alternate Level Stairs-Number of Steps: 14 Alternate Level Stairs-Rails: Left Bathroom Shower/Tub: Teacher, early years/pre: Standard Bathroom Accessibility: Yes   Home Equipment: Rollator (4 wheels);Rolling Walker (2 wheels);Wheelchair - power;Cane - single point   Additional Comments: information taken from previous admissions      Prior Functioning/Environment Prior Level of Function : Needs assist       Physical Assist : Mobility (physical);ADLs (physical) Mobility (physical): Bed mobility;Transfers ADLs (physical): Bathing;Dressing;Toileting Mobility Comments: pt reports she requries assist for bed mobilty and is lifted out of bed by her husband; household MRADLs via mwc ADLs Comments: assist for all ADLs- reports husband helps her get dressed, has an aid that comes in 1x a week for bathing or she goes to her sisters        OT Problem List:        OT Treatment/Interventions:      OT Goals(Current goals can be found in the care plan section) Acute Rehab OT Goals Patient Stated Goal: rest OT Goal Formulation: With patient/family Time For Goal Achievement: 10/05/22 Potential to Achieve Goals: Good ADL Goals Pt Will Perform Grooming: with min assist;sitting Pt Will Perform Lower Body Dressing: with mod assist;sitting/lateral leans Pt Will Transfer to Toilet: with min  assist Pt Will Perform Toileting - Clothing Manipulation and hygiene: with mod assist  OT Frequency: Min 2X/week    Co-evaluation              AM-PAC OT "6 Clicks" Daily Activity     Outcome Measure Help from another person eating meals?: A Lot Help from another person taking care of personal grooming?: A Lot Help from another person toileting, which includes using toliet, bedpan, or urinal?: Total Help from another person bathing (including washing, rinsing, drying)?: Total Help from another person to put on and taking off regular upper body clothing?: Total Help from another person to put on and taking off regular lower body clothing?: Total 6 Click Score: 8   End of Session    Activity Tolerance:   Patient left:    OT Visit Diagnosis: Unsteadiness on feet (R26.81);Muscle weakness (generalized) (M62.81)  Time: [id number] OT Time Calculation (min): 29 min Charges:  OT General Charges $OT Visit: 1 Visit OT Evaluation $OT Re-eval: 1 Re-eval OT Treatments $Self Care/Home Management : 8-22 mins  Waymon Amato, MS, OTR/L   Jean Johnson 09/21/2022, 1:05 PM

## 2022-09-21 NOTE — Progress Notes (Signed)
[facility]  ROUNDING NOTE   Subjective:   Jean Johnson is a 72 y.o. female with past medical conditions including diabetes, atrial fibrillation on Eliquis, dyslipidemia, hypertension, end-stage renal disease status post renal transplant x 2, diabetic neuropathy, and PUD.  Patient presents to the emergency department with complaints of shortness of breath and chest pain.  Patient has been admitted for Acute on chronic diastolic heart failure (HCC) [I50.33] Acute on chronic diastolic (congestive) heart failure (HCC) [I50.33] Dyspnea, unspecified type [R06.00]  Patient is known to our practice and is followed outpatient by Dr. Lanora Manis.  Patient was last seen in office on 08/23/2022 for routine follow-up.    Patient seen resting in bed, husband at bedside Alert and oriented Poor appetite, unable to tolerate breakfast Denies nausea or vomiting  Febrile overnight  Objective:  Vital signs in last 24 hours:  Temp:  [98.4 F (36.9 C)-100.6 F (38.1 C)] 99.6 F (37.6 C) (03/13 0812) Pulse Rate:  [82-96] 93 (03/13 0812) Resp:  [17-18] 17 (03/13 0812) BP: (110-128)/(47-66) 128/66 (03/13 0812) SpO2:  [100 %] 100 % (03/13 0812)  Weight change:  Filed Weights   09/10/22 0600 09/11/22 0334  Weight: 52.7 kg 50.4 kg    Intake/Output: I/O last 3 completed shifts: In: 580 [P.O.:480; IV Piggyback:100] Out: 500 [Urine:500]   Intake/Output this shift:  No intake/output data recorded.  Physical Exam: General: NAD  Head: Normocephalic, atraumatic. Moist oral mucosal membranes  Eyes: Anicteric  Lungs:  Clear to auscultation, normal effort  Heart: Regular rate and rhythm  Abdomen:  Soft, nontender, nondistended  Extremities: No peripheral edema.  Neurologic: Nonfocal, moving all four extremities  Skin: No lesions  Access: None    Basic Metabolic Panel: Recent Labs  Lab 09/15/22 0527 09/16/22 0516 09/18/22 0555 09/19/22 0658 09/20/22 0932 09/21/22 0645  NA  143  --  143 143 147* 148*  K 4.0 4.2 5.1 4.9 4.5 4.1  CL 110  --  111 111 113* 115*  CO2 24  --  '24 24 23 24  '$ GLUCOSE 88  --  100* 80 111* 88  BUN 56*  --  71* 73* 81* 93*  CREATININE 2.83*  --  2.94* 3.03* 3.01* 3.32*  CALCIUM 8.3*  --  8.5* 8.6* 8.6* 8.7*  MG  --  1.8 2.1  --   --   --   PHOS  --  4.4 4.6  --   --   --      Liver Function Tests: No results for input(s): "AST", "ALT", "ALKPHOS", "BILITOT", "PROT", "ALBUMIN" in the last 168 hours.  No results for input(s): "LIPASE", "AMYLASE" in the last 168 hours. No results for input(s): "AMMONIA" in the last 168 hours.  CBC: Recent Labs  Lab 09/18/22 0555 09/20/22 0932 09/21/22 0645  WBC 7.5 7.5 9.2  HGB 9.3* 9.0* 9.2*  HCT 30.6* 30.0* 29.8*  MCV 101.0* 100.3* 98.7  PLT 165 186 210     Cardiac Enzymes: No results for input(s): "CKTOTAL", "CKMB", "CKMBINDEX", "TROPONINI" in the last 168 hours.  BNP: Invalid input(s): "POCBNP"  CBG: Recent Labs  Lab 09/18/22 1143 09/18/22 1656 09/18/22 1826 09/18/22 2114 09/19/22 2054  GLUCAP 160* 50* 122* 66 148*     Microbiology: Results for orders placed or performed during the hospital encounter of 09/06/22  Urine Culture (for pregnant, neutropenic or urologic patients or patients with an indwelling urinary catheter)     Status: Abnormal (Preliminary result)   Collection Time: 09/20/22  9:44 AM   Specimen: Urine, Clean Catch  Result Value Ref Range Status   Specimen Description   Final    URINE, CLEAN CATCH Performed at [facility], [address], [place], [place] [address]    Special Requests   Final    NONE Performed at [facility], [address], [place], [place] [address]    Culture (A)  Final    >=100,000 COLONIES/mL Lonell Grandchild NEGATIVE RODS SUSCEPTIBILITIES TO FOLLOW Performed at [facility], [place] [address], [place], [place] [address]    Report Status PENDING  Incomplete    Coagulation Studies: No results for input(s):  "LABPROT", "INR" in the last 72 hours.  Urinalysis: Recent Labs    09/20/22 0944  COLORURINE YELLOW*  LABSPEC 1.011  PHURINE 5.0  GLUCOSEU NEGATIVE  HGBUR NEGATIVE  BILIRUBINUR NEGATIVE  KETONESUR NEGATIVE  PROTEINUR 100*  NITRITE NEGATIVE  LEUKOCYTESUR LARGE*       Imaging: DG Chest Port 1 View  Result Date: 09/20/2022 CLINICAL DATA:  Fever. EXAM: PORTABLE CHEST 1 VIEW COMPARISON:  Chest x-ray dated September 08, 2022. FINDINGS: Chronic cardiomegaly. Mild diffuse interstitial and hazy airspace opacities throughout both lungs have worsened in the interval. Unchanged layering small right pleural effusion with right basilar atelectasis. Decreased now small layering left pleural effusion with continued left lower lobe atelectasis. No pneumothorax. No acute osseous abnormality. IMPRESSION: 1. Worsening pulmonary edema. 2. Decreased now small left pleural effusion. Unchanged small right pleural effusion. Electronically Signed   By: Titus Dubin M.D.   On: 09/20/2022 09:01     Medications:    cefTRIAXone (ROCEPHIN)  IV Stopped (09/20/22 1340)     amiodarone  200 mg Oral BID   Followed by   Derrill Memo ON 09/27/2022] amiodarone  200 mg Oral Daily   apixaban  2.5 mg Oral BID   vitamin C  500 mg Oral BID   atorvastatin  10 mg Oral Daily   diltiazem  60 mg Oral Q12H   docusate sodium  100 mg Oral BID   ezetimibe  10 mg Oral Daily   feeding supplement  237 mL Oral TID BM   [START ON 09/22/2022] furosemide  40 mg Oral Daily   multivitamin with minerals  1 tablet Oral Daily   mycophenolate  500 mg Oral BID   mouth rinse  15 mL Mouth Rinse 4 times per day   potassium chloride SA  20 mEq Oral Daily   pregabalin  50 mg Oral Daily   tacrolimus  1 mg Oral BID   zinc sulfate  220 mg Oral Daily   acetaminophen **OR** acetaminophen, iohexol, labetalol, morphine injection, nitroGLYCERIN, ondansetron **OR** ondansetron (ZOFRAN) IV, mouth rinse, traZODone  Assessment/ Plan:  [name] is a 72 y.o.  female  with past medical conditions including diabetes, atrial fibrillation on Eliquis, dyslipidemia, hypertension, end-stage renal disease status post renal transplant x 2, diabetic neuropathy, and PUD.  Patient presents to the emergency department with complaints of shortness of breath and chest pain.  Patient has been admitted for Acute on chronic diastolic heart failure (HCC) [I50.33] Acute on chronic diastolic (congestive) heart failure (HCC) [I50.33] Dyspnea, unspecified type [R06.00]   Acute Kidney Injury on chronic kidney disease stage IIIAt with baseline creatinine 1.27 and GFR of 45 on 08/18/22.  Acute kidney injury secondary to cardiorenal syndrome Chronic kidney disease is secondary to diabetes, hypertension and CHF. No exposure to IV contrast. Furosemide drip started in ED.  Creatinine continues to slowly rise.  Will decrease oral furosemide to once daily.  Continue tacrolimus 1 mg twice daily with mycophenolate as ordered.  Will require a follow-up appointment in our office at discharge.  Lab Results  Component Value Date   CREATININE 3.32 (H) 09/21/2022   CREATININE 3.01 (H) 09/20/2022   CREATININE 3.03 (H) 09/19/2022    Intake/Output Summary (Last 24 hours) at 09/21/2022 1232 Last data filed at 09/20/2022 1500 Gross per 24 hour  Intake 580 ml  Output 300 ml  Net 280 ml    2. Anemia of chronic kidney disease  Lab Results  Component Value Date   HGB 9.2 (L) 09/21/2022    Hemoglobin just within desired range.  Will continue to monitor for now.  3. Secondary Hyperparathyroidism: with outpatient labs: PTH 141, phosphorus 3.2, calcium 9.7 on 08/18/22.   Lab Results  Component Value Date   CALCIUM 8.7 (L) 09/21/2022   CAION 1.25 08/30/2022   PHOS 4.6 09/18/2022    Calcium and phosphorus acceptable.  4. Diabetes mellitus type II with chronic kidney disease/renal manifestations: insulin/noninsulin dependent. Home regimen includes Humalog and levemir.  Most recent hemoglobin A1c is 5.7 on 04/14/22.   Sliding scale insulin managed by primary team.  5. Acute on Chronic diastolic heart failure. ECHO on 09/08/22 shows EF 60-65% with a grade 1 diastolic dysfunction, mild AVR with stenosis.   Will decrease to oral furosemide 40 mg daily.  Will continue to monitor volume status.  6.  Urinary tract infection, UA noted with leukocytes and patient febrile.  Patient febrile overnight, continued antibiotics managed by primary team.   LOS: [place] 3/13/202412:32 PM

## 2022-09-21 NOTE — Progress Notes (Addendum)
Physical Therapy Treatment Patient Details Name: Jean Johnson MRN: [id number] DOB: 1951/02/06 Today's Date: 09/21/2022   History of Present Illness Jean Johnson is a 72 y.o. African-American female with medical history significant for end-stage renal disease s/p renal transplant x2, hypertension, dyslipidemia, hypothyroidism, paroxysmal atrial fibrillation on Eliquis, PUD, type 2 diabetes mellitus with diabetic neuropathy, who presented to the emergency room with acute onset of worsening dyspnea with associated chest pain which has been progressively worse over the last week.    PT Comments    Pt agreeable to session and wants to be able to sit with more independence on EOB. Focused session on sitting balance. Needs total assist for all mobility and demonstrates heavy L lateral leaning in supine position and seated (reports new this admission). Pt reports pain with all movement. Assisted in repositioning in bed. Will continue to progress as able. New goals update this date.  Recommendations for follow up therapy are one component of a multi-disciplinary discharge planning process, led by the attending physician.  Recommendations may be updated based on patient status, additional functional criteria and insurance authorization.  Follow Up Recommendations  Skilled nursing-short term rehab (<3 hours/day) Can patient physically be transported by private vehicle: No   Assistance Recommended at Discharge Frequent or constant Supervision/Assistance  Patient can return home with the following A lot of help with walking and/or transfers;A lot of help with bathing/dressing/bathroom;Assistance with cooking/housework;Assistance with feeding;Direct supervision/assist for medications management;Direct supervision/assist for financial management;Assist for transportation;Help with stairs or ramp for entrance   Equipment Recommendations  None recommended by PT    Recommendations for Other  Services       Precautions / Restrictions Precautions Precautions: Fall;Other (comment) Precaution Comments: Sacral wound; aspiration; L UE AV fistula, painful L LE Restrictions Weight Bearing Restrictions: No     Mobility  Bed Mobility Overal bed mobility: Needs Assistance Bed Mobility: Supine to Sit, Sit to Supine     Supine to sit: Total assist Sit to supine: Total assist   General bed mobility comments: total assist for bed mobility. Once seated at EOB, heavy L side lean and unable to sit without heavy assist despite hand placement. Poor tolerance for sitting. Returned back to bed and respositioned    Transfers                   General transfer comment: doesn't transfer at baseline    Ambulation/Gait                   Stairs             Wheelchair Mobility    Modified Rankin (Stroke Patients Only)       Balance Overall balance assessment: Needs assistance Sitting-balance support: Feet unsupported, Bilateral upper extremity supported Sitting balance-Leahy Scale: Zero                                      Cognition Arousal/Alertness: Awake/alert Behavior During Therapy: Flat affect Overall Cognitive Status: Within Functional Limits for tasks assessed                                 General Comments: sleepy, however agreeable to session. Reports L side lean is new        Exercises      General Comments  Pertinent Vitals/Pain Pain Assessment Pain Assessment: Faces Faces Pain Scale: Hurts even more Pain Location: reports no pain with rest, however with any movement, complains of pain on L side Pain Descriptors / Indicators: Discomfort, Grimacing, Guarding, Sore Pain Intervention(s): Limited activity within patient's tolerance, Repositioned    Home Living Family/patient expects to be discharged to:: Private residence Living Arrangements: Spouse/significant other Available Help at  Discharge: Family;Available 24 hours/day Type of Home: House Home Access: Level entry     Alternate Level Stairs-Number of Steps: 14 Home Layout: Bed/bath upstairs;1/2 bath on main level;Two level Home Equipment: Rollator (4 wheels);Rolling Walker (2 wheels);Wheelchair - power;Cane - single point Additional Comments: information taken from previous admissions    Prior Function            PT Goals (current goals can now be found in the care plan section) Acute Rehab PT Goals Patient Stated Goal: to go to rehab PT Goal Formulation: With patient Time For Goal Achievement: 09/22/22 Potential to Achieve Goals: Fair Progress towards PT goals: Progressing toward goals    Frequency    Min 2X/week      PT Plan Current plan remains appropriate    Co-evaluation              AM-PAC PT "6 Clicks" Mobility   Outcome Measure  Help needed turning from your back to your side while in a flat bed without using bedrails?: Total Help needed moving from lying on your back to sitting on the side of a flat bed without using bedrails?: Total Help needed moving to and from a bed to a chair (including a wheelchair)?: Total Help needed standing up from a chair using your arms (e.g., wheelchair or bedside chair)?: Total Help needed to walk in hospital room?: Total Help needed climbing 3-5 steps with a railing? : Total 6 Click Score: 6    End of Session   Activity Tolerance: Patient limited by pain;Patient limited by fatigue Patient left: in bed;with call bell/phone within reach Nurse Communication: Mobility status PT Visit Diagnosis: Other abnormalities of gait and mobility (R26.89);Muscle weakness (generalized) (M62.81)     Time: [id number] PT Time Calculation (min) (ACUTE ONLY): 10 min  Charges:  $Therapeutic Activity: 8-22 mins                     Greggory Stallion, PT, DPT, GCS [phone]    Latarsha Zani 09/21/2022, 11:49 AM

## 2022-09-22 DIAGNOSIS — I5033 Acute on chronic diastolic (congestive) heart failure: Secondary | ICD-10-CM | POA: Diagnosis not present

## 2022-09-22 LAB — BASIC METABOLIC PANEL
Anion gap: 8 (ref 5–15)
BUN: 85 mg/dL — ABNORMAL HIGH (ref 8–23)
CO2: 23 mmol/L (ref 22–32)
Calcium: 8.5 mg/dL — ABNORMAL LOW (ref 8.9–10.3)
Chloride: 115 mmol/L — ABNORMAL HIGH (ref 98–111)
Creatinine, Ser: 3.12 mg/dL — ABNORMAL HIGH (ref 0.44–1.00)
GFR, Estimated: 15 mL/min — ABNORMAL LOW (ref >60)
Glucose, Bld: 94 mg/dL (ref 70–99)
Potassium: 4 mmol/L (ref 3.5–5.1)
Sodium: 146 mmol/L — ABNORMAL HIGH (ref 135–145)

## 2022-09-22 LAB — GLUCOSE, CAPILLARY: Glucose-Capillary: 129 mg/dL — ABNORMAL HIGH (ref 70–99)

## 2022-09-22 NOTE — Progress Notes (Signed)
[facility]  ROUNDING NOTE   Subjective:   Jean Johnson is a 72 y.o. female with past medical conditions including diabetes, atrial fibrillation on Eliquis, dyslipidemia, hypertension, end-stage renal disease status post renal transplant x 2, diabetic neuropathy, and PUD.  Patient presents to the emergency department with complaints of shortness of breath and chest pain.  Patient has been admitted for Acute on chronic diastolic heart failure (HCC) [I50.33] Acute on chronic diastolic (congestive) heart failure (HCC) [I50.33] Dyspnea, unspecified type [R06.00]  Patient is known to our practice and is followed outpatient by Dr. Lanora Manis.  Patient was last seen in office on 08/23/2022 for routine follow-up.    Patient seen sitting up in bed Resting quietly, no family at bedside Reports she continues to feel weak Appetite remains poor No lower extremity edema  Afebrile  Objective:  Vital signs in last 24 hours:  Temp:  [98 F (36.7 C)-99.6 F (37.6 C)] 98 F (36.7 C) (03/14 0845) Pulse Rate:  [86-88] 86 (03/14 0845) Resp:  [16-18] 18 (03/14 0845) BP: (109-124)/(65-80) 109/80 (03/14 0845) SpO2:  [97 %-100 %] 98 % (03/14 0845)  Weight change:  Filed Weights   09/10/22 0600 09/11/22 0334  Weight: 52.7 kg 50.4 kg    Intake/Output: I/O last 3 completed shifts: In: 100 [IV Piggyback:100] Out: 100 [Urine:100]   Intake/Output this shift:  Total I/O In: 240 [P.O.:240] Out: -   Physical Exam: General: NAD  Head: Normocephalic, atraumatic. Moist oral mucosal membranes  Eyes: Anicteric  Lungs:  Clear to auscultation, normal effort  Heart: Regular rate and rhythm  Abdomen:  Soft, nontender, nondistended  Extremities: No peripheral edema.  Neurologic: Nonfocal, moving all four extremities  Skin: No lesions  Access: None    Basic Metabolic Panel: Recent Labs  Lab 09/16/22 0516 09/16/22 0516 09/18/22 0555 09/19/22 0658 09/20/22 0932 09/21/22 0645  09/22/22 0658  NA  --   --  143 143 147* 148* 146*  K 4.2  --  5.1 4.9 4.5 4.1 4.0  CL  --   --  111 111 113* 115* 115*  CO2  --   --  '24 24 23 24 23  '$ GLUCOSE  --   --  100* 80 111* 88 94  BUN  --   --  71* 73* 81* 93* 85*  CREATININE  --   --  2.94* 3.03* 3.01* 3.32* 3.12*  CALCIUM  --    < > 8.5* 8.6* 8.6* 8.7* 8.5*  MG 1.8  --  2.1  --   --   --   --   PHOS 4.4  --  4.6  --   --   --   --    < > = values in this interval not displayed.     Liver Function Tests: No results for input(s): "AST", "ALT", "ALKPHOS", "BILITOT", "PROT", "ALBUMIN" in the last 168 hours.  No results for input(s): "LIPASE", "AMYLASE" in the last 168 hours. No results for input(s): "AMMONIA" in the last 168 hours.  CBC: Recent Labs  Lab 09/18/22 0555 09/20/22 0932 09/21/22 0645 09/21/22 1952  WBC 7.5 7.5 9.2 9.8  NEUTROABS  --   --   --  8.0*  HGB 9.3* 9.0* 9.2* 9.0*  HCT 30.6* 30.0* 29.8* 29.7*  MCV 101.0* 100.3* 98.7 100.0  PLT 165 186 210 205     Cardiac Enzymes: No results for input(s): "CKTOTAL", "CKMB", "CKMBINDEX", "TROPONINI" in the last 168 hours.  BNP: Invalid input(s): "POCBNP"  CBG: Recent Labs  Lab 09/18/22 1656 09/18/22 1826 09/18/22 2114 09/19/22 2054 09/21/22 1639  GLUCAP 50* 122* 74 148* 59     Microbiology: Results for orders placed or performed during the hospital encounter of 09/06/22  Urine Culture (for pregnant, neutropenic or urologic patients or patients with an indwelling urinary catheter)     Status: Abnormal (Preliminary result)   Collection Time: 09/20/22  9:44 AM   Specimen: Urine, Clean Catch  Result Value Ref Range Status   Specimen Description   Final    URINE, CLEAN CATCH Performed at [facility], [address] [place], [place] [address]    Special Requests   Final    NONE Performed at [facility], [address], [place], [place] [address]    Culture (A)  Final    >=100,000 COLONIES/mL ESCHERICHIA  COLI SUSCEPTIBILITIES TO FOLLOW Performed at [facility], [place] [address], [place], [place] [address]    Report Status PENDING  Incomplete    Coagulation Studies: No results for input(s): "LABPROT", "INR" in the last 72 hours.  Urinalysis: Recent Labs    09/20/22 0944  COLORURINE YELLOW*  LABSPEC 1.011  PHURINE 5.0  GLUCOSEU NEGATIVE  HGBUR NEGATIVE  BILIRUBINUR NEGATIVE  KETONESUR NEGATIVE  PROTEINUR 100*  NITRITE NEGATIVE  LEUKOCYTESUR LARGE*       Imaging: No results found.   Medications:    cefTRIAXone (ROCEPHIN)  IV 1 g (09/22/22 1211)     amiodarone  200 mg Oral BID   Followed by   Derrill Memo ON 09/27/2022] amiodarone  200 mg Oral Daily   apixaban  2.5 mg Oral BID   vitamin C  500 mg Oral BID   atorvastatin  10 mg Oral Daily   diltiazem  60 mg Oral Q12H   docusate sodium  100 mg Oral BID   ezetimibe  10 mg Oral Daily   feeding supplement  237 mL Oral TID BM   furosemide  40 mg Oral Daily   multivitamin with minerals  1 tablet Oral Daily   mycophenolate  500 mg Oral BID   mouth rinse  15 mL Mouth Rinse 4 times per day   potassium chloride SA  20 mEq Oral Daily   pregabalin  50 mg Oral Daily   tacrolimus  1 mg Oral BID   zinc sulfate  220 mg Oral Daily   acetaminophen **OR** acetaminophen, iohexol, labetalol, morphine injection, nitroGLYCERIN, ondansetron **OR** ondansetron (ZOFRAN) IV, mouth rinse, traZODone  Assessment/ Plan:  [name] is a 72 y.o.  female  with past medical conditions including diabetes, atrial fibrillation on Eliquis, dyslipidemia, hypertension, end-stage renal disease status post renal transplant x 2, diabetic neuropathy, and PUD.  Patient presents to the emergency department with complaints of shortness of breath and chest pain.  Patient has been admitted for Acute on chronic diastolic heart failure (HCC) [I50.33] Acute on chronic diastolic (congestive) heart failure (HCC) [I50.33] Dyspnea, unspecified type  [R06.00]   Acute Kidney Injury on chronic kidney disease stage IIIAt with baseline creatinine 1.27 and GFR of 45 on 08/18/22.  Acute kidney injury secondary to cardiorenal syndrome Chronic kidney disease is secondary to diabetes, hypertension and CHF. No exposure to IV contrast. Furosemide drip started in ED.   Creatinine has improved with decreasing dosing of diuretic. No urine output recorded.   Continue tacrolimus 1 mg twice daily with mycophenolate as ordered.  Will require a follow-up appointment in our office at discharge.  Lab  Results  Component Value Date   CREATININE 3.12 (H) 09/22/2022   CREATININE 3.32 (H) 09/21/2022   CREATININE 3.01 (H) 09/20/2022    Intake/Output Summary (Last 24 hours) at 09/22/2022 1318 Last data filed at 09/22/2022 0935 Gross per 24 hour  Intake 340 ml  Output 100 ml  Net 240 ml    2. Anemia of chronic kidney disease  Lab Results  Component Value Date   HGB 9.0 (L) 09/21/2022    Hemoglobin at goal.  Will continue to monitor for now.  3. Secondary Hyperparathyroidism: with outpatient labs: PTH 141, phosphorus 3.2, calcium 9.7 on 08/18/22.   Lab Results  Component Value Date   CALCIUM 8.5 (L) 09/22/2022   CAION 1.25 08/30/2022   PHOS 4.6 09/18/2022    Will continue to monitor bone minerals during this admission.  4. Diabetes mellitus type II with chronic kidney disease/renal manifestations: insulin/noninsulin dependent. Home regimen includes Humalog and levemir. Most recent hemoglobin A1c is 5.7 on 04/14/22.   Glucose well controlled. Sliding scale insulin managed by primary team.  5. Acute on Chronic diastolic heart failure. ECHO on 09/08/22 shows EF 60-65% with a grade 1 diastolic dysfunction, mild AVR with stenosis.   Continue furosemide 40 mg daily.    6.  Urinary tract infection, UA noted with leukocytes and patient febrile.   Continue antibiotics per primary team.   LOS: [place] 3/14/20241:18 PM

## 2022-09-22 NOTE — Progress Notes (Signed)
Palliative:  Chart review completed.  Jean Johnson's discharge has been delayed due to her to urinary tract infection she is still offered a bed for short-term rehab at [facility].  No needs at this time.  Face-to-face conference with transition of care team.   Jean Johnson has experienced acute kidney injury on CKD secondary to cardiorenal syndrome.  She has been followed by nephrology while inpatient and sees Dr. Lanora Manis outpatient.  She is now noted to have a urinary tract infection, and is being treated.   Plan: Continue full scope/full code.  Short-term rehab at [facility].  Continue with outpatient nephrology support.      No charge Quinn Axe, NP Palliative medicine team Team phone [phone] Greater than 50% of this time was spent counseling and coordinating care related to the above assessment and plan.

## 2022-09-22 NOTE — Care Management Important Message (Signed)
Important Message  Patient Details  Name: Jean Johnson MRN: [id number] Date of Birth: [birth year]   Medicare Important Message Given:  Yes     Dannette Barbara 09/22/2022, 2:26 PM

## 2022-09-22 NOTE — Progress Notes (Signed)
Progress Note   Patient: Jean Johnson [id number] DOB: Nov 18, 1950 DOA: 09/06/2022     16 DOS: the patient was seen and examined on 09/22/2022      Subjective: Patient seen and examined at bedside this morning in the presence of her nurse Temperature have improved Urine culture showing E. coli sensitivity pending She appears lethargic She denies nausea vomiting abdominal pain or chest pain  Brief Narrative / Hospital Course:  72 year old African-American female with past medical history significant for ESRD s/p renal transplant x 2 now having CKD stage IIIb, hypertension, dyslipidemia, hypothyroidism, PAF on anticoagulation with Eliquis, PUD, type 2 diabetes mellitus with diabetic neuropathy as well as other comorbidities who presented to the ED with acute worsening of her dyspnea. She has had diminished urine output without dysuria urinary frequency or flank pain.  02/27: BP was 172/91 and heart rate 103. EKG Aflutter w/ variable AV block. Chest x-ray showed cardiomegaly with central pulmonary vascular congestion and small bilateral pleural effusions and patchy opacities in the lung bases that may reflect edema or infection. BUN 38 and creatinine 1.33. Was given 4 baby aspirin, 0.4 mg sublingual nitroglycerin and 20 mg of IV Lasix. VQ low probability PE 02/28: cardiology saw patient - continue IV lasix bid, echo done  02/29: renal fxn worsening, lasix to continuous infusion, d/c lisinopril  03/01: Nephrology consulted, lasix drip increased. Echo with moderate LVH, mild AS, and mild-moderate TR, overall similar to prior from 2023    03/02-03/04: Remain on lasix gtt.  03/04: transition from lasix gtt to po  03/05: remained stable on po meds  03/06: Cardiology s/o. Palliative care [place] discussion. PT/OT to see.  03/07-03/10: PT/OT recs for SNF, TOC following, placement pending thru weekend.  03/11 Mon SNF still in process 03/12: elevated temp and tachycardia, Cr slightly up, UA  concerning for UTI, CXR no apparent pneumonia but concern for increased pulmonary edema. Caution w/ diuresis, will start ceftriaxone and await UCx     Consultants:  Cardiology Palliative Care  Nephrology    Procedures: none           ASSESSMENT & PLAN:   Principal Problem:   Acute on chronic diastolic (congestive) heart failure (HCC) Active Problems:   Hypertensive urgency   Chest pain   Elevated d-dimer   Renal transplant recipient   Dyslipidemia   Paroxysmal atrial fibrillation ([place])   Type 2 diabetes mellitus with chronic kidney disease, with long-term current use of insulin (HCC)   Diabetic neuropathy (HCC)   Acute on chronic diastolic heart failure (HCC)   Protein-calorie malnutrition, severe     Acute on chronic diastolic congestive heart failure - stable Hypertensive urgency - resolved  Pulmonary edema - worsening  Lasix drip to oral Lasix on 09/12/2022 and has remained euvolemic  ACE inhibitor on hold at this time in the setting of increasing creatinine, other GDMT limited by renal function  Strict I's and O's, low-salt diet, daily weights Cardiology s/o 03/06 Nephrology following.   Caution w/ diuresis given renal function    E. coli UTI Ceftriaxone started 09/20/22 Urine culture results showing E. coli sensitivity pending Follow fever curve, VS, CBC Avoiding IV fluids d/t pulmonary edema Monitor BMP   Paroxysmal atrial fibrillation with rapid ventricular response, now rate controlled Eliquis 2.5 mg twice daily Cardizem 120 mg twice daily Given worsening renal function flecainide was switched to amiodarone '200mg'$  BID x 14 days, then '200mg'$  daily thereafter    ESRD s/p renal transplant x 2 Chronic  immunosuppression Acute on chronic renal failure likely multifactorial in the setting of diuresis as well as hepatorenal syndrome Still makes urine.  Followed by nephrology outpatient. Baseline creatinine 1.29-1.33 Continue immunosuppressive regimen of CellCept  and tacrolimus  Dose of Lasix as well as tacrolimus have been decreased by nephrologist Nephrology on board - appreciate recommendations   Elevated troponin likely demand ischemia Dyslipidemia Borderline elevation.  Flat trend Not indicative of ACS Continue continue aspirin and statin therapy   Diabetic neuropathy (HCC) Continue with Pregabalin 50 mg p.o. twice daily --> reduced d/t renal function    Type 2 diabetes mellitus with chronic kidney disease, with long-term current use of insulin (HCC) continue insulin therapy Continue to Monitor CBG's per Protocol       DVT prophylaxis: eliquis Pertinent IV fluids/nutrition: no continuous IV fluids, poor po intake but improving  Central lines / invasive devices: none   Code Status: FULL CODE    Current Admission Status: inpatient   TOC needs / Dispo plan: SNF rehab Barriers to discharge / significant pending items: Being actively managed for fever and UTI       Physical Exam Constitutional:      General: She is not in acute distress.    Appearance: She is not toxic-appearing.  Cardiovascular:     Rate and Rhythm: Normal rate and regular rhythm.  Pulmonary:     Effort: Pulmonary effort is normal. No tachypnea.     Breath sounds: Decreased bilaterally especially at the bases Abdominal:     Palpations: Abdomen is soft.  Musculoskeletal:     Right lower leg: No edema.     Left lower leg: No edema.  Skin:    General: Skin is warm and dry.  Neurological:     General: No focal deficit present.     Mental Status: She is oriented to person, place, and time. Mental status is at baseline.       Data Reviewed: Labs reviewed showing renal function have stabilized with a creatinine of 3.12 today   Family Communication: No family present at bedside   Disposition: Status is: Inpatient    Planned Discharge Destination: To nursing facility  Patient still meets inpatient criteria since she is being managed for acute E. coli UTI  and sensitivity results are still pending. We will need sensitivity results to guide therapy at discharge   Time spent: 35 minutes          Vitals:   09/21/22 2021 09/22/22 0516 09/22/22 0845 09/22/22 1519  BP: 124/66 117/74 109/80 (!) 104/57  Pulse: 87 88 86 78  Resp: '16 16 18 16  '$ Temp: 98.9 F (37.2 C) 99.6 F (37.6 C) 98 F (36.7 C) (!) 97.2 F (36.2 C)  TempSrc: Oral     SpO2: 100% 97% 98% 100%  Weight:      Height:         Author: Verline Lema, MD 09/22/2022 4:39 PM  For on call review [email or web address].

## 2022-09-23 DIAGNOSIS — I5033 Acute on chronic diastolic (congestive) heart failure: Secondary | ICD-10-CM | POA: Diagnosis not present

## 2022-09-23 LAB — BASIC METABOLIC PANEL
Anion gap: 13 (ref 5–15)
BUN: 94 mg/dL — ABNORMAL HIGH (ref 8–23)
CO2: 22 mmol/L (ref 22–32)
Calcium: 8.7 mg/dL — ABNORMAL LOW (ref 8.9–10.3)
Chloride: 115 mmol/L — ABNORMAL HIGH (ref 98–111)
Creatinine, Ser: 2.93 mg/dL — ABNORMAL HIGH (ref 0.44–1.00)
GFR, Estimated: 16 mL/min — ABNORMAL LOW (ref >60)
Glucose, Bld: 98 mg/dL (ref 70–99)
Potassium: 3.9 mmol/L (ref 3.5–5.1)
Sodium: 150 mmol/L — ABNORMAL HIGH (ref 135–145)

## 2022-09-23 LAB — URINE CULTURE: Culture: >=100000 — AB

## 2022-09-23 LAB — GLUCOSE, CAPILLARY: Glucose-Capillary: 107 mg/dL — ABNORMAL HIGH (ref 70–99)

## 2022-09-23 MED ORDER — DOCUSATE SODIUM 100 MG PO CAPS: 100.0000 mg | ORAL_CAPSULE | Freq: Two times a day (BID) | ORAL | 0 refills | Status: DC

## 2022-09-23 MED ORDER — FUROSEMIDE 40 MG PO TABS: 40.0000 mg | ORAL_TABLET | Freq: Every day | ORAL | 1 refills | Status: DC

## 2022-09-23 MED ORDER — DILTIAZEM HCL 60 MG PO TABS: 60.0000 mg | ORAL_TABLET | Freq: Two times a day (BID) | ORAL | 1 refills | Status: DC

## 2022-09-23 MED ORDER — PREGABALIN 50 MG PO CAPS: 50.0000 mg | ORAL_CAPSULE | Freq: Every day | ORAL | 0 refills | Status: DC

## 2022-09-23 MED ORDER — AMIODARONE HCL 200 MG PO TABS: 200.0000 mg | ORAL_TABLET | Freq: Two times a day (BID) | ORAL | 0 refills | Status: DC

## 2022-09-23 MED ORDER — AMIODARONE HCL 200 MG PO TABS: 200.0000 mg | ORAL_TABLET | Freq: Every day | ORAL | 1 refills | Status: DC

## 2022-09-23 MED ORDER — DEXTROSE 5 % IV SOLN: INTRAVENOUS | Status: DC

## 2022-09-23 MED ORDER — TACROLIMUS 1 MG PO CAPS: 1.0000 mg | ORAL_CAPSULE | Freq: Two times a day (BID) | ORAL | 1 refills | Status: DC

## 2022-09-23 MED ORDER — ACETAMINOPHEN 325 MG PO TABS: 650.0000 mg | ORAL_TABLET | Freq: Four times a day (QID) | ORAL | 0 refills | Status: DC | PRN

## 2022-09-23 MED ORDER — ASCORBIC ACID 500 MG PO TABS: 500.0000 mg | ORAL_TABLET | Freq: Two times a day (BID) | ORAL | 1 refills | Status: DC

## 2022-09-23 MED ORDER — APIXABAN 2.5 MG PO TABS: 2.5000 mg | ORAL_TABLET | Freq: Two times a day (BID) | ORAL | 1 refills | Status: DC

## 2022-09-23 MED ORDER — ONDANSETRON HCL 4 MG PO TABS: 4.0000 mg | ORAL_TABLET | Freq: Four times a day (QID) | ORAL | 0 refills | Status: DC | PRN

## 2022-09-23 NOTE — TOC Progression Note (Addendum)
Transition of Care [facility]) - Progression Note    Patient Details  Name: Jean Johnson MRN: [id number] Date of Birth: 1950-08-03  Transition of Care [facility]) CM/SW [place], LCSW Phone Number: 09/23/2022, 1:52 PM  Clinical Narrative:   Discharge summary sent to [facility] in preparation for discharge tomorrow. They will have low air loss mattress delivered today.  2:40 pm: SNF is requesting that patient bring Prograf and Cellcept from home. Sister-in-law will notify husband.  Expected Discharge Plan: [place] Barriers to Discharge: Continued Medical Work up  Expected Discharge Plan and Services       Living arrangements for the past 2 months: [place]: [place] ([place]) Date [place]: 09/08/22 Time [place]: [place] Representative spoke with at [place]: [place] ([place]) Interventions SDOH Screenings   Food Insecurity: No Food Insecurity (09/07/2022)  Housing: Low Risk  (09/07/2022)  Transportation Needs: No Transportation Needs (09/07/2022)  Utilities: Not At Risk (09/07/2022)  Tobacco Use: Low Risk  (09/10/2022)    Readmission Risk Interventions    09/08/2022    4:23 PM 04/27/2022    3:34 PM  Readmission Risk Prevention Plan  Transportation Screening Complete Complete  HRI or Home Care Consult  Complete  Social Work Consult for [place] Planning/Counseling  Complete  Palliative Care Screening  Not Applicable  Medication Review Press photographer) Complete Complete  PCP or Specialist appointment within 3-5 days of discharge Complete   HRI or [place] Complete   SW Recovery Care/Counseling Consult Complete   Palliative Care Screening Not Complete   Skilled Nursing Facility Complete

## 2022-09-23 NOTE — Progress Notes (Addendum)
[facility]  ROUNDING NOTE   Subjective:   Jean Johnson is a 72 y.o. female with past medical conditions including diabetes, atrial fibrillation on Eliquis, dyslipidemia, hypertension, end-stage renal disease status post renal transplant x 2, diabetic neuropathy, and PUD.  Patient presents to the emergency department with complaints of shortness of breath and chest pain.  Patient has been admitted for Acute on chronic diastolic heart failure (HCC) [I50.33] Acute on chronic diastolic (congestive) heart failure (HCC) [I50.33] Dyspnea, unspecified type [R06.00]  Patient is known to our practice and is followed outpatient by Jean Johnson.  Patient was last seen in office on 08/23/2022 for routine follow-up.    Patient resting quietly No family at bedside Denies discomfort Continues to complain of weakness  Objective:  Vital signs in last 24 hours:  Temp:  [97.2 F (36.2 C)-98.6 F (37 C)] 98.3 F (36.8 C) (03/15 0731) Pulse Rate:  [78-88] 83 (03/15 0731) Resp:  [16-22] 22 (03/15 0731) BP: (104-125)/(57-67) 125/57 (03/15 0731) SpO2:  [96 %-100 %] 96 % (03/15 0731)  Weight change:  Filed Weights   09/10/22 0600 09/11/22 0334  Weight: 52.7 kg 50.4 kg    Intake/Output: I/O last 3 completed shifts: In: 61 [P.O.:660; IV Piggyback:200] Out: 100 [Urine:100]   Intake/Output this shift:  Total I/O In: 100 [P.O.:100] Out: 500 [Urine:500]  Physical Exam: General: NAD  Head: Normocephalic, atraumatic. Moist oral mucosal membranes  Eyes: Anicteric  Lungs:  Clear to auscultation, normal effort  Heart: Regular rate and rhythm  Abdomen:  Soft, nontender, nondistended  Extremities: No peripheral edema.  Neurologic: Nonfocal, moving all four extremities  Skin: No lesions  Access: None    Basic Metabolic Panel: Recent Labs  Lab 09/18/22 0555 09/19/22 0658 09/20/22 0932 09/21/22 0645 09/22/22 0658 09/23/22 0648  NA 143 143 147* 148* 146* 150*  K 5.1 4.9  4.5 4.1 4.0 3.9  CL 111 111 113* 115* 115* 115*  CO2 24 24 23 24 23 22   GLUCOSE 100* 80 111* 88 94 98  BUN 71* 73* 81* 93* 85* 94*  CREATININE 2.94* 3.03* 3.01* 3.32* 3.12* 2.93*  CALCIUM 8.5* 8.6* 8.6* 8.7* 8.5* 8.7*  MG 2.1  --   --   --   --   --   PHOS 4.6  --   --   --   --   --      Liver Function Tests: No results for input(s): "AST", "ALT", "ALKPHOS", "BILITOT", "PROT", "ALBUMIN" in the last 168 hours.  No results for input(s): "LIPASE", "AMYLASE" in the last 168 hours. No results for input(s): "AMMONIA" in the last 168 hours.  CBC: Recent Labs  Lab 09/18/22 0555 09/20/22 0932 09/21/22 0645 09/21/22 1952  WBC 7.5 7.5 9.2 9.8  NEUTROABS  --   --   --  8.0*  HGB 9.3* 9.0* 9.2* 9.0*  HCT 30.6* 30.0* 29.8* 29.7*  MCV 101.0* 100.3* 98.7 100.0  PLT 165 186 210 205     Cardiac Enzymes: No results for input(s): "CKTOTAL", "CKMB", "CKMBINDEX", "TROPONINI" in the last 168 hours.  BNP: Invalid input(s): "POCBNP"  CBG: Recent Labs  Lab 09/18/22 1826 09/18/22 2114 09/19/22 2054 09/21/22 1639 09/22/22 2115  GLUCAP 122* 74 148* 50 129*     Microbiology: Results for orders placed or performed during the hospital encounter of 09/06/22  Urine Culture (for pregnant, neutropenic or urologic patients or patients with an indwelling urinary catheter)     Status: Abnormal   Collection Time:  09/20/22  9:44 AM   Specimen: Urine, Clean Catch  Result Value Ref Range Status   Specimen Description   Final    URINE, CLEAN CATCH Performed at [facility], [address] [place], [place] [address]    Special Requests   Final    NONE Performed at [facility], [place], [place] [address]    Culture >=100,000 COLONIES/mL ESCHERICHIA COLI (A)  Final   Report Status 09/23/2022 FINAL  Final   Organism ID, Bacteria ESCHERICHIA COLI (A)  Final      Susceptibility   Escherichia coli - MIC*    AMPICILLIN 4 SENSITIVE Sensitive     CEFAZOLIN <=4  SENSITIVE Sensitive     CEFEPIME <=0.12 SENSITIVE Sensitive     CEFTRIAXONE <=0.25 SENSITIVE Sensitive     CIPROFLOXACIN <=0.25 SENSITIVE Sensitive     GENTAMICIN <=1 SENSITIVE Sensitive     IMIPENEM <=0.25 SENSITIVE Sensitive     NITROFURANTOIN <=16 SENSITIVE Sensitive     TRIMETH/SULFA <=20 SENSITIVE Sensitive     AMPICILLIN/SULBACTAM <=2 SENSITIVE Sensitive     PIP/TAZO <=4 SENSITIVE Sensitive     * >=100,000 COLONIES/mL ESCHERICHIA COLI    Coagulation Studies: No results for input(s): "LABPROT", "INR" in the last 72 hours.  Urinalysis: No results for input(s): "COLORURINE", "LABSPEC", "PHURINE", "GLUCOSEU", "HGBUR", "BILIRUBINUR", "KETONESUR", "PROTEINUR", "UROBILINOGEN", "NITRITE", "LEUKOCYTESUR" in the last 72 hours.  Invalid input(s): "APPERANCEUR"     Imaging: No results found.   Medications:    cefTRIAXone (ROCEPHIN)  IV 1 g (09/23/22 1226)   dextrose 75 mL/hr at 09/23/22 1226     amiodarone  200 mg Oral BID   Followed by   Derrill Memo ON 09/27/2022] amiodarone  200 mg Oral Daily   apixaban  2.5 mg Oral BID   vitamin C  500 mg Oral BID   atorvastatin  10 mg Oral Daily   diltiazem  60 mg Oral Q12H   docusate sodium  100 mg Oral BID   ezetimibe  10 mg Oral Daily   feeding supplement  237 mL Oral TID BM   furosemide  40 mg Oral Daily   multivitamin with minerals  1 tablet Oral Daily   mycophenolate  500 mg Oral BID   mouth rinse  15 mL Mouth Rinse 4 times per day   potassium chloride SA  20 mEq Oral Daily   pregabalin  50 mg Oral Daily   tacrolimus  1 mg Oral BID   zinc sulfate  220 mg Oral Daily   acetaminophen **OR** acetaminophen, iohexol, labetalol, morphine injection, nitroGLYCERIN, ondansetron **OR** ondansetron (ZOFRAN) IV, mouth rinse, traZODone  Assessment/ Plan:  [name] is a 72 y.o.  female  with past medical conditions including diabetes, atrial fibrillation on Eliquis, dyslipidemia, hypertension, end-stage renal disease status post  renal transplant x 2, diabetic neuropathy, and PUD.  Patient presents to the emergency department with complaints of shortness of breath and chest pain.  Patient has been admitted for Acute on chronic diastolic heart failure (HCC) [I50.33] Acute on chronic diastolic (congestive) heart failure (HCC) [I50.33] Dyspnea, unspecified type [R06.00]   Acute Kidney Injury on chronic kidney disease stage IIIAt with baseline creatinine 1.27 and GFR of 45 on 08/18/22.  Acute kidney injury secondary to cardiorenal syndrome Chronic kidney disease is secondary to diabetes, hypertension and CHF. No exposure to IV contrast. Furosemide drip started in ED.   Creatinine decreased. Continue to encourage urine output.   Continue tacrolimus 1  mg twice daily with mycophenolate as ordered.  Will require a follow-up appointment in our office at discharge.  Lab Results  Component Value Date   CREATININE 2.93 (H) 09/23/2022   CREATININE 3.12 (H) 09/22/2022   CREATININE 3.32 (H) 09/21/2022    Intake/Output Summary (Last 24 hours) at 09/23/2022 1229 Last data filed at 09/23/2022 1000 Gross per 24 hour  Intake 620 ml  Output 500 ml  Net 120 ml    2. Anemia of chronic kidney disease  Lab Results  Component Value Date   HGB 9.0 (L) 09/21/2022    Will continue to monitor for now.  3. Secondary Hyperparathyroidism: with outpatient labs: PTH 141, phosphorus 3.2, calcium 9.7 on 08/18/22.   Lab Results  Component Value Date   CALCIUM 8.7 (L) 09/23/2022   CAION 1.25 08/30/2022   PHOS 4.6 09/18/2022    Will continue to monitor bone minerals during this admission.  4. Diabetes mellitus type II with chronic kidney disease/renal manifestations: insulin/noninsulin dependent. Home regimen includes Humalog and levemir. Most recent hemoglobin A1c is 5.7 on 04/14/22.   Primary team will manage SSI  5. Acute on Chronic diastolic heart failure. ECHO on 09/08/22 shows EF 60-65% with a grade 1 diastolic dysfunction, mild AVR  with stenosis.   Continue furosemide 40 mg daily.    6.  Urinary tract infection, UA noted with leukocytes and patient febrile.   Continue antibiotics per primary team.  7. Hypernatremia, sodium 150. Furosemide reduced to daily on 09/22/22. Agree with D5 infusion.    LOS: [place] 3/15/202412:29 PM

## 2022-09-23 NOTE — Progress Notes (Signed)
Physical Therapy Treatment Patient Details Name: Jean Johnson MRN: [id number] DOB: 01/31/1951 Today's Date: 09/23/2022   History of Present Illness Jean Johnson is a 72 y.o. African-American female with medical history significant for end-stage renal disease s/p renal transplant x2, hypertension, dyslipidemia, hypothyroidism, paroxysmal atrial fibrillation on Eliquis, PUD, type 2 diabetes mellitus with diabetic neuropathy, who presented to the emergency room with acute onset of worsening dyspnea with associated chest pain which has been progressively worse over the last week.    PT Comments    Pt lethargic this am, agreed to PT/OT session for safe mobility and sitting EOB. Pt requires total assist for mobility and sitting EOB with feet and B Ue's supported due to inability to attain upright 90* sitting. Pt able to reach forward for objects, however limited by strength, posture, and limited AROM at shoulders. Pt fatigues quickly, tolerating only ~5 minutes EOB. Will continue to see pt acutely until pt transitions to SNF for continued care.    Recommendations for follow up therapy are one component of a multi-disciplinary discharge planning process, led by the attending physician.  Recommendations may be updated based on patient status, additional functional criteria and insurance authorization.  Follow Up Recommendations    Can patient physically be transported by private vehicle: No   Assistance Recommended at Discharge Frequent or constant Supervision/Assistance  Patient can return home with the following A lot of help with walking and/or transfers;A lot of help with bathing/dressing/bathroom;Assistance with cooking/housework;Assistance with feeding;Direct supervision/assist for medications management;Direct supervision/assist for financial management;Assist for transportation;Help with stairs or ramp for entrance   Equipment Recommendations  None recommended by PT     Recommendations for Other Services       Precautions / Restrictions Precautions Precautions: Fall;Other (comment) Precaution Comments: Sacral wound; aspiration; L UE AV fistula, painful L LE Restrictions Weight Bearing Restrictions: No     Mobility  Bed Mobility Overal bed mobility: Needs Assistance Bed Mobility: Supine to Sit, Sit to Supine, Rolling Rolling: Max assist, +2 for physical assistance   Supine to sit: +2 for physical assistance, Total assist Sit to supine: +2 for physical assistance, Total assist   General bed mobility comments: total assist for bed mobility. Once seated at EOB, heavy posterior side lean and unable to sit without heavy assist despite hand placement. Maintains sitting balance for ~5 min at EOB with MAX A.    Transfers                   General transfer comment: doesn't transfer at baseline    Ambulation/Gait               General Gait Details:  (non-ambulatory at baseline)   Chief Strategy Officer    Modified Rankin (Stroke Patients Only)       Balance Overall balance assessment: Needs assistance Sitting-balance support: Feet supported, Bilateral upper extremity supported Sitting balance-Leahy Scale: Zero Sitting balance - Comments: Requires MAX A to maintain upright sitting balance at EOB. Postural control: Right lateral lean, Posterior lean                                  Cognition Arousal/Alertness: Awake/alert Behavior During Therapy: Flat affect, WFL for tasks assessed/performed Overall Cognitive Status: Within Functional Limits for tasks assessed Area of Impairment: Problem solving  Problem Solving: Slow processing          Exercises Other Exercises Other Exercises: encouraged participation, however pt very limited in ability due to weakness throughout    General Comments General comments (skin integrity, edema, etc.): Pt  incontinet of stool during session, nursing in to change sacral dressing. No breakdown noted at B heels.      Pertinent Vitals/Pain Pain Assessment Pain Assessment: Faces Faces Pain Scale: Hurts even more Pain Location: reports no pain with rest, however with any movement, complains of pain on L side, sacral wound, back, and Left groin/thigh area Pain Descriptors / Indicators: Discomfort, Grimacing, Guarding, Sore Pain Intervention(s): Monitored during session, Limited activity within patient's tolerance    Home Living                          Prior Function            PT Goals (current goals can now be found in the care plan section) Acute Rehab PT Goals Patient Stated Goal: to go to rehab    Frequency    Min 2X/week      PT Plan Current plan remains appropriate    Co-evaluation PT/OT/SLP Co-Evaluation/Treatment: Yes Reason for Co-Treatment: Complexity of the patient's impairments (multi-system involvement);For patient/therapist safety;To address functional/ADL transfers PT goals addressed during session: Mobility/safety with mobility;Balance OT goals addressed during session: ADL's and self-care      AM-PAC PT "6 Clicks" Mobility   Outcome Measure  Help needed turning from your back to your side while in a flat bed without using bedrails?: Total Help needed moving from lying on your back to sitting on the side of a flat bed without using bedrails?: Total Help needed moving to and from a bed to a chair (including a wheelchair)?: Total Help needed standing up from a chair using your arms (e.g., wheelchair or bedside chair)?: Total Help needed to walk in hospital room?: Total Help needed climbing 3-5 steps with a railing? : Total 6 Click Score: 6    End of Session   Activity Tolerance: Patient limited by pain;Patient limited by fatigue Patient left: in bed;with call bell/phone within reach Nurse Communication: Mobility status PT Visit Diagnosis: Other  abnormalities of gait and mobility (R26.89);Muscle weakness (generalized) (M62.81)     Time: [id number] PT Time Calculation (min) (ACUTE ONLY): 18 min  Charges:  $Therapeutic Activity: 8-22 mins                    Mikel Cella, PTA    Jean Johnson 09/23/2022, 1:09 PM

## 2022-09-23 NOTE — Progress Notes (Signed)
Occupational Therapy Treatment Patient Details Name: Jean Johnson MRN: [id number] DOB: [birth year] Today's Date: 09/23/2022   History of present illness Jean Johnson is a 72 y.o. African-American female with medical history significant for end-stage renal disease s/p renal transplant x2, hypertension, dyslipidemia, hypothyroidism, paroxysmal atrial fibrillation on Eliquis, PUD, type 2 diabetes mellitus with diabetic neuropathy, who presented to the emergency room with acute onset of worsening dyspnea with associated chest pain which has been progressively worse over the last week.   OT comments  [name] was seen for OT/PT co-tx this date. Pt received semi-supine in bed, reports feeling tired but agreeable to therapy session. She continues to be significantly functionally limited by generalized weakness, decreased activity tolerance, decreased balance, and decreased functional use of BUE. She requires TOTAL A +2 for sup<>sit EOB. MAX A +2 for rolling R<>L for peri-care. She is able to perform drinking semi-supine in bed with MIN A at end of session. Pt making good progress toward goals and continues to benefit from skilled OT services to maximize return to PLOF and minimize risk of future falls, injury, caregiver burden, and readmission. Will continue to follow POC. Discharge recommendation remains appropriate.     Recommendations for follow up therapy are one component of a multi-disciplinary discharge planning process, led by the attending physician.  Recommendations may be updated based on patient status, additional functional criteria and insurance authorization.    Follow Up Recommendations  Skilled nursing-short term rehab (<3 hours/day)     Assistance Recommended at Discharge Frequent or constant Supervision/Assistance  Patient can return home with the following  Two people to help with walking and/or transfers;A lot of help with bathing/dressing/bathroom   Equipment  Recommendations  Hospital bed    Recommendations for Other Services      Precautions / Restrictions Precautions Precautions: Fall;Other (comment) Precaution Comments: Sacral wound; aspiration; L UE AV fistula, painful L LE Restrictions Weight Bearing Restrictions: No       Mobility Bed Mobility Overal bed mobility: Needs Assistance Bed Mobility: Supine to Sit, Sit to Supine, Rolling Rolling: Max assist, +2 for physical assistance   Supine to sit: Total assist, +2 for physical assistance Sit to supine: Total assist, +2 for physical assistance   General bed mobility comments: total assist for bed mobility. Once seated at EOB, heavy posterior side lean and unable to sit without heavy assist despite hand placement. Maintains sitting balance for ~5 min at EOB with MAX A.    Transfers Overall transfer level: Needs assistance Equipment used: Rolling walker (2 wheels) Transfers: Sit to/from Stand Sit to Stand: Max assist, From elevated surface, +2 safety/equipment                 Balance Overall balance assessment: Needs assistance Sitting-balance support: Feet unsupported, Bilateral upper extremity supported Sitting balance-Leahy Scale: Zero Sitting balance - Comments: Requires MAX A to maintain upright sitting balance at EOB.       Standing balance comment: NT                           ADL either performed or assessed with clinical judgement   ADL Overall ADL's : Needs assistance/impaired Eating/Feeding: Bed level;Minimal assistance Eating/Feeding Details (indicate cue type and reason): Min A for drinking at bed level, fatigues quickly. Continues to require MOD A to for feeding. Grooming: Sitting;Maximal assistance Grooming Details (indicate cue type and reason): MAX A +2 for sitting balance and simulated face  washing at EOB.                     Toileting- Clothing Manipulation and Hygiene: Total assistance Toileting - Clothing Manipulation  Details (indicate cue type and reason): TOTAL A for peri care after pt found to be soiled of BM/Urine.     Functional mobility during ADLs: Maximal assistance;Rolling walker (2 wheels);+2 for physical assistance      Extremity/Trunk Assessment              Vision Baseline Vision/History: 1 Wears glasses Patient Visual Report: No change from baseline     Perception     Praxis      Cognition Arousal/Alertness: Awake/alert Behavior During Therapy: Flat affect, WFL for tasks assessed/performed Overall Cognitive Status: Within Functional Limits for tasks assessed                                          Exercises Other Exercises Other Exercises: OT/PT facilitated bed mobility, UB ADL management, and peri-care as described above. Ongoing education on safety, falls prevention, compensatory strategies, and importance of functional activity provided t/o session.    Shoulder Instructions       General Comments      Pertinent Vitals/ Pain       Pain Assessment Pain Assessment: Faces Faces Pain Scale: Hurts even more Pain Location: reports no pain with rest, however with any movement, complains of pain on L side and at sacral wound Pain Descriptors / Indicators: Discomfort, Grimacing, Guarding, Sore Pain Intervention(s): Limited activity within patient's tolerance, Monitored during session, Repositioned  Home Living                                          Prior Functioning/Environment              Frequency  Min 2X/week        Progress Toward Goals  OT Goals(current goals can now be found in the care plan section)  Progress towards OT goals: Progressing toward goals  Acute Rehab OT Goals Patient Stated Goal: to feel better OT Goal Formulation: With patient/family Time For Goal Achievement: 10/05/22 Potential to Achieve Goals: Good  Plan Discharge plan remains appropriate;Frequency remains appropriate     Co-evaluation    PT/OT/SLP Co-Evaluation/Treatment: Yes Reason for Co-Treatment: Complexity of the patient's impairments (multi-system involvement);For patient/therapist safety;To address functional/ADL transfers PT goals addressed during session: Mobility/safety with mobility;Balance OT goals addressed during session: ADL's and self-care      AM-PAC OT "6 Clicks" Daily Activity     Outcome Measure   Help from another person eating meals?: A Lot Help from another person taking care of personal grooming?: A Lot Help from another person toileting, which includes using toliet, bedpan, or urinal?: Total Help from another person bathing (including washing, rinsing, drying)?: Total Help from another person to put on and taking off regular upper body clothing?: A Lot Help from another person to put on and taking off regular lower body clothing?: Total 6 Click Score: 9    End of Session    OT Visit Diagnosis: Unsteadiness on feet (R26.81);Muscle weakness (generalized) (M62.81)   Activity Tolerance Patient limited by fatigue;Patient limited by pain   Patient Left in bed;with call bell/phone within reach;with family/visitor present  Nurse Communication Other (comment) (Pt soiled sacral wound bandage. RN in to assess and apply clean bandage during session.)        Time: [id number] OT Time Calculation (min): 30 min  Charges: OT General Charges $OT Visit: 1 Visit OT Treatments $Self Care/Home Management : 8-22 mins  Shara Blazing, M.S., OTR/L 09/23/22, 12:41 PM

## 2022-09-23 NOTE — Discharge Summary (Signed)
Physician Discharge Summary   Patient: Jean Johnson MRN: [id number] DOB: 07/10/1951  Admit date:     09/06/2022  Discharge date: 09/23/22  Discharge Physician: Verline Lema   PCP: Gladstone Lighter, MD     Discharge Diagnoses: Acute on chronic diastolic congestive heart failure - stable Hypertensive urgency - resolved  Pulmonary edema -improved E. coli UTI status post antibiotic therapy Paroxysmal atrial fibrillation with rapid ventricular response, now rate controlled ESRD s/p renal transplant x 2 Chronic immunosuppression Acute on chronic renal failure likely multifactorial in the setting of diuresis as well as hepatorenal syndrome Elevated troponin likely demand ischemia Dyslipidemia Diabetic neuropathy (HCC) Type 2 diabetes mellitus with chronic kidney disease, with long-term current use of insulin [facility])  Hospital Course: 72 year old African-American female with past medical history significant for ESRD s/p renal transplant x 2 now having CKD stage IIIb, hypertension, dyslipidemia, hypothyroidism, PAF on anticoagulation with Eliquis, PUD, type 2 diabetes mellitus with diabetic neuropathy as well as other comorbidities who presented to the ED with acute worsening of her dyspnea. She has had diminished urine output without dysuria urinary frequency or flank pain.  During admission patient was found to have EKG Aflutter w/ variable AV block. Chest x-ray showed cardiomegaly with central pulmonary vascular congestion and small bilateral pleural effusions and patchy opacities in the lung bases that may reflect edema or infection. VQ low probability PE.  She was placed on Lasix drip by nephrologist and was seen by cardiologist in consultation.  She diuresed well and Lasix drip was transitioned to oral Lasix.  Echo with EF 60 to 65%, moderate LVH, mild AS, and mild-moderate TR, overall similar to prior from 2023    Patient was also found to have symptoms of UTI and has completed  antibiotic therapy.  Was seen by PT OT who recommended placement to rehab facility.  Patient is therefore being discharged to rehab and will follow-up with cardiologist, and nephrologist.       Consultants: Neurology nephrology Procedures performed: None Disposition: Skilled nursing facility Diet recommendation:  Cardiac diet DISCHARGE MEDICATION: Allergies as of 09/23/2022       Reactions   E-mycin [erythromycin] Swelling   And itching   Penicillins Swelling   Throat Tolerated 1st generation cephalosporin (CEFAZOLIN) on 03/24/2021 without documented ADRs.  No reaction with Ancef on 08/30/2022        Medication List     STOP taking these medications    alendronate 70 MG tablet Commonly known as: FOSAMAX   diltiazem 240 MG 24 hr capsule Commonly known as: CARDIZEM CD   flecainide 100 MG tablet Commonly known as: TAMBOCOR   lisinopril 30 MG tablet Commonly known as: ZESTRIL   Potassium Chloride ER 20 MEQ Tbcr   predniSONE 20 MG tablet Commonly known as: DELTASONE       TAKE these medications    acetaminophen 325 MG tablet Commonly known as: TYLENOL Take 2 tablets (650 mg total) by mouth every 6 (six) hours as needed for mild pain (or Fever >/= 101).   amiodarone 200 MG tablet Commonly known as: PACERONE Take 1 tablet (200 mg total) by mouth 2 (two) times daily.   amiodarone 200 MG tablet Commonly known as: PACERONE Take 1 tablet (200 mg total) by mouth daily. Start taking on: September 27, 2022   apixaban 2.5 MG Tabs tablet Commonly known as: ELIQUIS Take 1 tablet (2.5 mg total) by mouth 2 (two) times daily. What changed:  medication strength how much to take  ascorbic acid 500 MG tablet Commonly known as: VITAMIN C Take 1 tablet (500 mg total) by mouth 2 (two) times daily.   atorvastatin 10 MG tablet Commonly known as: LIPITOR Take 10 mg by mouth daily.   diltiazem 60 MG tablet Commonly known as: CARDIZEM Take 1 tablet (60 mg total) by  mouth every 12 (twelve) hours.   docusate sodium 100 MG capsule Commonly known as: COLACE Take 1 capsule (100 mg total) by mouth 2 (two) times daily.   ezetimibe 10 MG tablet Commonly known as: ZETIA Take 10 mg by mouth daily.   furosemide 40 MG tablet Commonly known as: LASIX Take 1 tablet (40 mg total) by mouth daily. Start taking on: September 24, 2022 What changed:  medication strength how much to take when to take this reasons to take this   HumaLOG 100 UNIT/ML injection Generic drug: insulin lispro Inject 5 Units into the skin 2 (two) times daily.   insulin detemir 100 UNIT/ML injection Commonly known as: LEVEMIR Inject 8 Units into the skin at bedtime.   mycophenolate 500 MG tablet Commonly known as: CELLCEPT Take 500 mg by mouth 2 (two) times daily.   ondansetron 4 MG tablet Commonly known as: ZOFRAN Take 1 tablet (4 mg total) by mouth every 6 (six) hours as needed for nausea.   pregabalin 50 MG capsule Commonly known as: LYRICA Take 1 capsule (50 mg total) by mouth daily. What changed: when to take this   tacrolimus 1 MG capsule Commonly known as: PROGRAF Take 1 capsule (1 mg total) by mouth 2 (two) times daily. What changed: how much to take   traZODone 50 MG tablet Commonly known as: DESYREL Take 0.5 tablets (25 mg total) by mouth at bedtime as needed for sleep.        Contact information for after-discharge care     [facility] .   Service: Skilled Nursing Contact information: [address] [place] [phone]                    Discharge Exam: Danley Danker Weights   09/10/22 0600 09/11/22 0334  Weight: 52.7 kg 50.4 kg   Constitutional:      General: She is not in acute distress.    Appearance: She is not toxic-appearing.  Cardiovascular:     Rate and Rhythm: Normal rate and regular rhythm.  Pulmonary:     Effort: Pulmonary effort is  normal. No tachypnea.     Breath sounds: Decreased bilaterally especially at the bases Abdominal:     Palpations: Abdomen is soft.  Musculoskeletal:     Right lower leg: No edema.     Left lower leg: No edema.  Skin:    General: Skin is warm and dry.  Neurological:     General: No focal deficit present.     Mental Status: She is oriented to person, place, and time. Mental status is at baseline.   Condition at discharge: good Discharge time spent: greater than 30 minutes.  Signed: Verline Lema, MD [facility] 09/23/2022

## 2022-09-24 DIAGNOSIS — I5033 Acute on chronic diastolic (congestive) heart failure: Secondary | ICD-10-CM | POA: Diagnosis not present

## 2022-09-24 LAB — BASIC METABOLIC PANEL
Anion gap: 6 (ref 5–15)
BUN: 94 mg/dL — ABNORMAL HIGH (ref 8–23)
CO2: 25 mmol/L (ref 22–32)
Calcium: 8.5 mg/dL — ABNORMAL LOW (ref 8.9–10.3)
Chloride: 115 mmol/L — ABNORMAL HIGH (ref 98–111)
Creatinine, Ser: 2.61 mg/dL — ABNORMAL HIGH (ref 0.44–1.00)
GFR, Estimated: 19 mL/min — ABNORMAL LOW (ref >60)
Glucose, Bld: 125 mg/dL — ABNORMAL HIGH (ref 70–99)
Potassium: 4 mmol/L (ref 3.5–5.1)
Sodium: 146 mmol/L — ABNORMAL HIGH (ref 135–145)

## 2022-09-24 NOTE — TOC Transition Note (Signed)
Transition of Care [facility]) - CM/SW Discharge Note   Patient Details  Name: Jean Johnson MRN: [id number] Date of Birth: Apr 20, 1951  Transition of Care [facility]) CM/SW Contact:  Harriet Masson, RN Phone Number: [phone] 09/24/2022, 11:40 AM   Clinical Narrative:    Pt will be [facility] to [facility]) confirmed by the facility. TOC RN spoke with sister-in-law Eyvonne Mechanic on the arrangements for [facility] transport Room 604 and bedside nurse to call report to [phone]. Team updated accordingly.  TOC RN remains available for any additional needs.    Final next level of care: [place] Barriers to Discharge: No Barriers Identified   Patient Goals and CMS Choice      Discharge Placement                  Patient to be transferred to facility by: [facility] Name of family member notified: Julien Nordmann (sister-in-law) made aware Patient and family notified of of transfer: 09/24/22  Discharge Plan and Services Additional resources added to the After Visit Summary for                              [facility] Agency: [place] ([place]) Date [facility] Agency Contacted: 09/08/22 Time [place]: [place] Representative spoke with at [place]: [place] ([place]) Interventions SDOH Screenings   Food Insecurity: No Food Insecurity (09/07/2022)  Housing: Low Risk  (09/07/2022)  Transportation Needs: No Transportation Needs (09/07/2022)  Utilities: Not At Risk (09/07/2022)  Tobacco Use: Low Risk  (09/10/2022)     Readmission Risk Interventions    09/08/2022    4:23 PM 04/27/2022    3:34 PM  Readmission Risk Prevention Plan  Transportation Screening Complete Complete  HRI or Home Care Consult  Complete  Social Work Consult for [facility] Planning/Counseling  Complete  Palliative Care Screening  Not Applicable  Medication Review Press photographer) Complete Complete  PCP or Specialist appointment within 3-5 days  of discharge Complete   HRI or [place] Complete   SW Recovery Care/Counseling Consult Complete   Palliative Care Screening Not Complete   Skilled Nursing Facility Complete

## 2022-09-24 NOTE — Progress Notes (Signed)
[facility]  ROUNDING NOTE   Subjective:   Jean Johnson is a 72 y.o. female with past medical conditions including diabetes, atrial fibrillation on Eliquis, dyslipidemia, hypertension, end-stage renal disease status post renal transplant x 2, diabetic neuropathy, and PUD.  Patient presents to the emergency department with complaints of shortness of breath and chest pain.  Patient has been admitted for Acute on chronic diastolic heart failure (HCC) [I50.33] Acute on chronic diastolic (congestive) heart failure (HCC) [I50.33] Dyspnea, unspecified type [R06.00]  Patient is known to our practice and is followed outpatient by Dr. Lanora Manis.  Patient was last seen in office on 08/23/2022 for routine follow-up.    Patient seen resting quietly, family at bedside Appetite remains poor Remains on room air with minimal lower extremity edema Urine output 900 mL recorded.  Objective:  Vital signs in last 24 hours:  Temp:  [97.7 F (36.5 C)-98 F (36.7 C)] 98 F (36.7 C) (03/16 0751) Pulse Rate:  [77-87] 78 (03/16 0751) Resp:  [14-20] 18 (03/16 0751) BP: (117-128)/(23-81) 119/64 (03/16 0751) SpO2:  [96 %-100 %] 96 % (03/16 0751) Weight:  [93 kg] 93 kg (03/16 0405)  Weight change:  Filed Weights   09/11/22 0334 09/24/22 0405  Weight: 50.4 kg 93 kg    Intake/Output: I/O last 3 completed shifts: In: 1502.3 [P.O.:460; I.V.:842.3; IV Piggyback:200] Out: 902 [Urine:900; Stool:2]   Intake/Output this shift:  No intake/output data recorded.  Physical Exam: General: NAD  Head: Normocephalic, atraumatic. Moist oral mucosal membranes  Eyes: Anicteric  Lungs:  Clear to auscultation, normal effort  Heart: Regular rate and rhythm  Abdomen:  Soft, nontender, nondistended  Extremities: Trace peripheral edema.  Neurologic: Nonfocal, moving all four extremities  Skin: No lesions  Access: None    Basic Metabolic Panel: Recent Labs  Lab 09/18/22 0555 09/19/22 0658  09/20/22 0932 09/21/22 0645 09/22/22 0658 09/23/22 0648 09/24/22 0434  NA 143   < > 147* 148* 146* 150* 146*  K 5.1   < > 4.5 4.1 4.0 3.9 4.0  CL 111   < > 113* 115* 115* 115* 115*  CO2 24   < > 23 24 23 22 25   GLUCOSE 100*   < > 111* 88 94 98 125*  BUN 71*   < > 81* 93* 85* 94* 94*  CREATININE 2.94*   < > 3.01* 3.32* 3.12* 2.93* 2.61*  CALCIUM 8.5*   < > 8.6* 8.7* 8.5* 8.7* 8.5*  MG 2.1  --   --   --   --   --   --   PHOS 4.6  --   --   --   --   --   --    < > = values in this interval not displayed.     Liver Function Tests: No results for input(s): "AST", "ALT", "ALKPHOS", "BILITOT", "PROT", "ALBUMIN" in the last 168 hours.  No results for input(s): "LIPASE", "AMYLASE" in the last 168 hours. No results for input(s): "AMMONIA" in the last 168 hours.  CBC: Recent Labs  Lab 09/18/22 0555 09/20/22 0932 09/21/22 0645 09/21/22 1952  WBC 7.5 7.5 9.2 9.8  NEUTROABS  --   --   --  8.0*  HGB 9.3* 9.0* 9.2* 9.0*  HCT 30.6* 30.0* 29.8* 29.7*  MCV 101.0* 100.3* 98.7 100.0  PLT 165 186 210 205     Cardiac Enzymes: No results for input(s): "CKTOTAL", "CKMB", "CKMBINDEX", "TROPONINI" in the last 168 hours.  BNP: Invalid input(s): "  POCBNP"  CBG: Recent Labs  Lab 09/18/22 2114 09/19/22 2054 09/21/22 1639 09/22/22 2115 09/23/22 2116  GLUCAP 74 148* 98 129* 107*     Microbiology: Results for orders placed or performed during the hospital encounter of 09/06/22  Urine Culture (for pregnant, neutropenic or urologic patients or patients with an indwelling urinary catheter)     Status: Abnormal   Collection Time: 09/20/22  9:44 AM   Specimen: Urine, Clean Catch  Result Value Ref Range Status   Specimen Description   Final    URINE, CLEAN CATCH Performed at [facility], [address] [place], [place] [address]    Special Requests   Final    NONE Performed at [facility], [address], [place], [place] [address]    Culture >=100,000  COLONIES/mL ESCHERICHIA COLI (A)  Final   Report Status 09/23/2022 FINAL  Final   Organism ID, Bacteria ESCHERICHIA COLI (A)  Final      Susceptibility   Escherichia coli - MIC*    AMPICILLIN 4 SENSITIVE Sensitive     CEFAZOLIN <=4 SENSITIVE Sensitive     CEFEPIME <=0.12 SENSITIVE Sensitive     CEFTRIAXONE <=0.25 SENSITIVE Sensitive     CIPROFLOXACIN <=0.25 SENSITIVE Sensitive     GENTAMICIN <=1 SENSITIVE Sensitive     IMIPENEM <=0.25 SENSITIVE Sensitive     NITROFURANTOIN <=16 SENSITIVE Sensitive     TRIMETH/SULFA <=20 SENSITIVE Sensitive     AMPICILLIN/SULBACTAM <=2 SENSITIVE Sensitive     PIP/TAZO <=4 SENSITIVE Sensitive     * >=100,000 COLONIES/mL ESCHERICHIA COLI    Coagulation Studies: No results for input(s): "LABPROT", "INR" in the last 72 hours.  Urinalysis: No results for input(s): "COLORURINE", "LABSPEC", "PHURINE", "GLUCOSEU", "HGBUR", "BILIRUBINUR", "KETONESUR", "PROTEINUR", "UROBILINOGEN", "NITRITE", "LEUKOCYTESUR" in the last 72 hours.  Invalid input(s): "APPERANCEUR"     Imaging: No results found.   Medications:    cefTRIAXone (ROCEPHIN)  IV Stopped (09/23/22 1256)     amiodarone  200 mg Oral BID   Followed by   Derrill Memo ON 09/27/2022] amiodarone  200 mg Oral Daily   apixaban  2.5 mg Oral BID   vitamin C  500 mg Oral BID   atorvastatin  10 mg Oral Daily   diltiazem  60 mg Oral Q12H   docusate sodium  100 mg Oral BID   ezetimibe  10 mg Oral Daily   feeding supplement  237 mL Oral TID BM   furosemide  40 mg Oral Daily   multivitamin with minerals  1 tablet Oral Daily   mycophenolate  500 mg Oral BID   mouth rinse  15 mL Mouth Rinse 4 times per day   potassium chloride SA  20 mEq Oral Daily   pregabalin  50 mg Oral Daily   tacrolimus  1 mg Oral BID   zinc sulfate  220 mg Oral Daily   acetaminophen **OR** acetaminophen, iohexol, labetalol, morphine injection, nitroGLYCERIN, ondansetron **OR** ondansetron (ZOFRAN) IV, mouth rinse,  traZODone  Assessment/ Plan:  [name] is a 72 y.o.  female  with past medical conditions including diabetes, atrial fibrillation on Eliquis, dyslipidemia, hypertension, end-stage renal disease status post renal transplant x 2, diabetic neuropathy, and PUD.  Patient presents to the emergency department with complaints of shortness of breath and chest pain.  Patient has been admitted for Acute on chronic diastolic heart failure (HCC) [I50.33] Acute on chronic diastolic (congestive) heart failure (HCC) [I50.33] Dyspnea, unspecified type [R06.00]   Acute Kidney Injury  on chronic kidney disease stage IIIAt with baseline creatinine 1.27 and GFR of 45 on 08/18/22.  Acute kidney injury secondary to cardiorenal syndrome Chronic kidney disease is secondary to diabetes, hypertension and CHF. No exposure to IV contrast. Furosemide drip started in ED.   Creatinine continues to slowly improve.  Continue tacrolimus 1 mg twice daily with mycophenolate as ordered.  Will require a follow-up appointment in our office at discharge.  Lab Results  Component Value Date   CREATININE 2.61 (H) 09/24/2022   CREATININE 2.93 (H) 09/23/2022   CREATININE 3.12 (H) 09/22/2022    Intake/Output Summary (Last 24 hours) at 09/24/2022 1038 Last data filed at 09/24/2022 0611 Gross per 24 hour  Intake 1122.27 ml  Output 401 ml  Net 721.27 ml    2. Anemia of chronic kidney disease  Lab Results  Component Value Date   HGB 9.0 (L) 09/21/2022    Hemoglobin remains within desired range.  3. Secondary Hyperparathyroidism: with outpatient labs: PTH 141, phosphorus 3.2, calcium 9.7 on 08/18/22.   Lab Results  Component Value Date   CALCIUM 8.5 (L) 09/24/2022   CAION 1.25 08/30/2022   PHOS 4.6 09/18/2022  Calcium acceptable. Will continue to monitor bone minerals during this admission.  4. Diabetes mellitus type II with chronic kidney disease/renal manifestations: insulin/noninsulin dependent. Home regimen  includes Humalog and levemir. Most recent hemoglobin A1c is 5.7 on 04/14/22.   Glucose well-controlled.  Primary team will manage SSI  5. Acute on Chronic diastolic heart failure. ECHO on 09/08/22 shows EF 60-65% with a grade 1 diastolic dysfunction, mild AVR with stenosis.   Continue furosemide 40 mg daily.    6.  Urinary tract infection, UA noted with leukocytes and patient febrile.   Ceftriaxone ordered by primary team.  7. Hypernatremia, sodium 146.  Responded well to D5 infusion.   LOS: [place] 3/16/202410:38 AM

## 2022-09-24 NOTE — Progress Notes (Signed)
Wynona Neat RN from [facility] called and report provided.

## 2022-09-24 NOTE — Progress Notes (Signed)
Progress Note   Patient: Jean Johnson [id number] DOB: June 02, 1951 DOA: 09/06/2022     18 DOS: the patient was seen and examined on 09/24/2022    Subjective: Patient seen and examined at bedside this morning Denies nausea vomiting or abdominal pain Completed 5 days of antibiotics for UTI Patient been taking 2 skilled nursing facility today  Brief Narrative / Hospital Course:  72 year old African-American female with past medical history significant for ESRD s/p renal transplant x 2 now having CKD stage IIIb, hypertension, dyslipidemia, hypothyroidism, PAF on anticoagulation with Eliquis, PUD, type 2 diabetes mellitus with diabetic neuropathy as well as other comorbidities who presented to the ED with acute worsening of her dyspnea. She has had diminished urine output without dysuria urinary frequency or flank pain.  02/27: BP was 172/91 and heart rate 103. EKG Aflutter w/ variable AV block. Chest x-ray showed cardiomegaly with central pulmonary vascular congestion and small bilateral pleural effusions and patchy opacities in the lung bases that may reflect edema or infection. BUN 38 and creatinine 1.33. Was given 4 baby aspirin, 0.4 mg sublingual nitroglycerin and 20 mg of IV Lasix. VQ low probability PE 02/28: cardiology saw patient - continue IV lasix bid, echo done  02/29: renal fxn worsening, lasix to continuous infusion, d/c lisinopril  03/01: Nephrology consulted, lasix drip increased. Echo with moderate LVH, mild AS, and mild-moderate TR, overall similar to prior from 2023    03/02-03/04: Remain on lasix gtt.  03/04: transition from lasix gtt to po  03/05: remained stable on po meds  03/06: Cardiology s/o. Palliative care [place] discussion. PT/OT to see.  03/07-03/10: PT/OT recs for SNF, TOC following, placement pending thru weekend.  03/11 Mon SNF still in process 03/12: elevated temp and tachycardia, Cr slightly up, UA concerning for UTI, CXR no apparent pneumonia but concern for  increased pulmonary edema. Caution w/ diuresis, will start ceftriaxone and await Ucx 3/14-3/16 patient currently stable being discharged to skilled nursing facility     Consultants:  Cardiology Palliative Care  Nephrology    Procedures: none           ASSESSMENT & PLAN:   Principal Problem:   Acute on chronic diastolic (congestive) heart failure ([place]) Active Problems:   Hypertensive urgency   Chest pain   Elevated d-dimer   Renal transplant recipient   Dyslipidemia   Paroxysmal atrial fibrillation ([place])   Type 2 diabetes mellitus with chronic kidney disease, with long-term current use of insulin (H[facility])   Diabetic neuropathy (H[facility])   Acute on chronic diastolic heart failure ([facility])   Protein-calorie malnutrition, severe     Acute on chronic diastolic congestive heart failure - stable Hypertensive urgency - resolved  Pulmonary edema - worsening  Lasix drip to oral Lasix on 09/12/2022 and has remained euvolemic  ACE inhibitor on hold at this time in the setting of increasing creatinine, other GDMT limited by renal function  Strict I's and O's, low-salt diet, daily weights Cardiology s/o 03/06 Nephrology following.   Caution w/ diuresis given renal function     Hyponatremia-improved Patient received IV D5W  E. coli UTI Ceftriaxone started 09/20/22 Urine culture results showing E. coli sensitivity pending Follow fever curve, VS, CBC Avoiding IV fluids d/t pulmonary edema Monitor BMP   Paroxysmal atrial fibrillation with rapid ventricular response, now rate controlled Eliquis 2.5 mg twice daily Cardizem 120 mg twice daily Given worsening renal function flecainide was switched to amiodarone 200mg  BID x 14 days, then 200mg  daily thereafter  ESRD s/p renal transplant x 2 Chronic immunosuppression Acute on chronic renal failure likely multifactorial in the setting of diuresis as well as hepatorenal syndrome Still makes urine.  Followed by nephrology outpatient.  Baseline creatinine 1.29-1.33 Continue immunosuppressive regimen of CellCept and tacrolimus  Dose of Lasix as well as tacrolimus have been decreased by nephrologist Nephrology on board - appreciate recommendations   Elevated troponin likely demand ischemia Dyslipidemia Borderline elevation.  Flat trend Not indicative of ACS Continue continue aspirin and statin therapy   Diabetic neuropathy (HCC) Continue with Pregabalin 50 mg p.o. twice daily --> reduced d/t renal function    Type 2 diabetes mellitus with chronic kidney disease, with long-term current use of insulin (HCC) continue insulin therapy Continue to Monitor CBG's per Protocol       DVT prophylaxis: eliquis Pertinent IV fluids/nutrition: no continuous IV fluids, poor po intake but improving  Central lines / invasive devices: none   Code Status: FULL CODE    Current Admission Status: inpatient   TOC needs / Dispo plan: SNF rehab Barriers to discharge / significant pending items: Being actively managed for fever and UTI       Physical Exam Constitutional:      General: She is not in acute distress.    Appearance: She is not toxic-appearing.  Cardiovascular:     Rate and Rhythm: Normal rate and regular rhythm.  Pulmonary:     Effort: Pulmonary effort is normal. No tachypnea.     Breath sounds: Decreased bilaterally especially at the bases Abdominal:     Palpations: Abdomen is soft.  Musculoskeletal:     Right lower leg: No edema.     Left lower leg: No edema.  Skin:    General: Skin is warm and dry.  Neurological:     General: No focal deficit present.     Mental Status: She is oriented to person, place, and time. Mental status is at baseline.        Data Reviewed: Labs reviewed showing renal function have stabilized   Family Communication: Discussed with patient's husband over the phone   Disposition: Status is: Inpatient    Planned Discharge Destination: To nursing facility     Time spent: 35  minutes      Physical Exam: Vitals:   09/23/22 1941 09/24/22 0405 09/24/22 0536 09/24/22 0751  BP: 117/81  (!) 128/59 119/64  Pulse: 87  77 78  Resp: 16  20 18   Temp: 97.9 F (36.6 C)  97.7 F (36.5 C) 98 F (36.7 C)  TempSrc: Oral  Oral   SpO2: 100%  97% 96%  Weight:  93 kg    Height:  5\' 2"  (1.575 m)     Author: Verline Lema, MD 09/24/2022 12:17 PM  For on call review [email or web address].

## 2022-09-24 NOTE — Progress Notes (Signed)
Called back and spoke to [place] the Network engineer who forward me to Joycelyn Schmid that told me the nurse was not available. She received the contact number to return the call for report on the patient. Awaiting return call.

## 2022-09-24 NOTE — Progress Notes (Signed)
Attempted to provide report twice; Was placed on hold for 5 minutes then routed back to concierge who tried to connect me again. No one picked up. Will try one more time later.

## 2022-09-28 ENCOUNTER — Emergency Department: Payer: Medicare Other

## 2022-09-28 ENCOUNTER — Other Ambulatory Visit: Payer: Self-pay

## 2022-09-28 ENCOUNTER — Inpatient Hospital Stay
Admission: EM | Admit: 2022-09-28 | Discharge: 2022-10-01 | DRG: 186 | Disposition: A | Discharge status: Hospice | Payer: Medicare Other | Source: Skilled Nursing Facility | Attending: Internal Medicine | Admitting: Internal Medicine

## 2022-09-28 ENCOUNTER — Encounter: Payer: Self-pay | Admitting: Internal Medicine

## 2022-09-28 DIAGNOSIS — I482 Chronic atrial fibrillation, unspecified: Secondary | ICD-10-CM | POA: Diagnosis present

## 2022-09-28 DIAGNOSIS — E039 Hypothyroidism, unspecified: Secondary | ICD-10-CM | POA: Diagnosis present

## 2022-09-28 DIAGNOSIS — N179 Acute kidney failure, unspecified: Secondary | ICD-10-CM | POA: Diagnosis present

## 2022-09-28 DIAGNOSIS — J90 Pleural effusion, not elsewhere classified: Principal | ICD-10-CM | POA: Diagnosis present

## 2022-09-28 DIAGNOSIS — I4892 Unspecified atrial flutter: Secondary | ICD-10-CM | POA: Diagnosis present

## 2022-09-28 DIAGNOSIS — L89896 Pressure-induced deep tissue damage of other site: Secondary | ICD-10-CM | POA: Diagnosis not present

## 2022-09-28 DIAGNOSIS — I130 Hypertensive heart and chronic kidney disease with heart failure and stage 1 through stage 4 chronic kidney disease, or unspecified chronic kidney disease: Secondary | ICD-10-CM | POA: Diagnosis present

## 2022-09-28 DIAGNOSIS — E870 Hyperosmolality and hypernatremia: Secondary | ICD-10-CM | POA: Diagnosis present

## 2022-09-28 DIAGNOSIS — I10 Essential (primary) hypertension: Secondary | ICD-10-CM | POA: Diagnosis not present

## 2022-09-28 DIAGNOSIS — Z8711 Personal history of peptic ulcer disease: Secondary | ICD-10-CM

## 2022-09-28 DIAGNOSIS — E1140 Type 2 diabetes mellitus with diabetic neuropathy, unspecified: Secondary | ICD-10-CM | POA: Diagnosis present

## 2022-09-28 DIAGNOSIS — R64 Cachexia: Secondary | ICD-10-CM | POA: Diagnosis present

## 2022-09-28 DIAGNOSIS — N2581 Secondary hyperparathyroidism of renal origin: Secondary | ICD-10-CM | POA: Diagnosis present

## 2022-09-28 DIAGNOSIS — Z515 Encounter for palliative care: Secondary | ICD-10-CM

## 2022-09-28 DIAGNOSIS — D631 Anemia in chronic kidney disease: Secondary | ICD-10-CM | POA: Diagnosis present

## 2022-09-28 DIAGNOSIS — D84821 Immunodeficiency due to drugs: Secondary | ICD-10-CM | POA: Diagnosis present

## 2022-09-28 DIAGNOSIS — M810 Age-related osteoporosis without current pathological fracture: Secondary | ICD-10-CM | POA: Diagnosis present

## 2022-09-28 DIAGNOSIS — L89154 Pressure ulcer of sacral region, stage 4: Secondary | ICD-10-CM | POA: Diagnosis present

## 2022-09-28 DIAGNOSIS — R54 Age-related physical debility: Secondary | ICD-10-CM | POA: Diagnosis present

## 2022-09-28 DIAGNOSIS — Z87442 Personal history of urinary calculi: Secondary | ICD-10-CM

## 2022-09-28 DIAGNOSIS — B9620 Unspecified Escherichia coli [E. coli] as the cause of diseases classified elsewhere: Secondary | ICD-10-CM | POA: Diagnosis present

## 2022-09-28 DIAGNOSIS — E785 Hyperlipidemia, unspecified: Secondary | ICD-10-CM | POA: Diagnosis present

## 2022-09-28 DIAGNOSIS — Z981 Arthrodesis status: Secondary | ICD-10-CM

## 2022-09-28 DIAGNOSIS — I480 Paroxysmal atrial fibrillation: Secondary | ICD-10-CM | POA: Diagnosis present

## 2022-09-28 DIAGNOSIS — Z79899 Other long term (current) drug therapy: Secondary | ICD-10-CM

## 2022-09-28 DIAGNOSIS — R0600 Dyspnea, unspecified: Principal | ICD-10-CM

## 2022-09-28 DIAGNOSIS — I5A Non-ischemic myocardial injury (non-traumatic): Secondary | ICD-10-CM | POA: Diagnosis not present

## 2022-09-28 DIAGNOSIS — I5032 Chronic diastolic (congestive) heart failure: Secondary | ICD-10-CM | POA: Diagnosis present

## 2022-09-28 DIAGNOSIS — T8619 Other complication of kidney transplant: Secondary | ICD-10-CM | POA: Diagnosis present

## 2022-09-28 DIAGNOSIS — R079 Chest pain, unspecified: Secondary | ICD-10-CM | POA: Diagnosis present

## 2022-09-28 DIAGNOSIS — E1122 Type 2 diabetes mellitus with diabetic chronic kidney disease: Secondary | ICD-10-CM | POA: Diagnosis present

## 2022-09-28 DIAGNOSIS — R627 Adult failure to thrive: Secondary | ICD-10-CM | POA: Diagnosis present

## 2022-09-28 DIAGNOSIS — Z66 Do not resuscitate: Secondary | ICD-10-CM | POA: Diagnosis present

## 2022-09-28 DIAGNOSIS — Y830 Surgical operation with transplant of whole organ as the cause of abnormal reaction of the patient, or of later complication, without mention of misadventure at the time of the procedure: Secondary | ICD-10-CM | POA: Diagnosis present

## 2022-09-28 DIAGNOSIS — N1832 Chronic kidney disease, stage 3b: Secondary | ICD-10-CM | POA: Diagnosis not present

## 2022-09-28 DIAGNOSIS — Z8249 Family history of ischemic heart disease and other diseases of the circulatory system: Secondary | ICD-10-CM

## 2022-09-28 DIAGNOSIS — E1129 Type 2 diabetes mellitus with other diabetic kidney complication: Secondary | ICD-10-CM | POA: Diagnosis present

## 2022-09-28 DIAGNOSIS — Z79624 Long term (current) use of inhibitors of nucleotide synthesis: Secondary | ICD-10-CM

## 2022-09-28 DIAGNOSIS — Z833 Family history of diabetes mellitus: Secondary | ICD-10-CM

## 2022-09-28 DIAGNOSIS — Z940 Kidney transplant status: Secondary | ICD-10-CM | POA: Diagnosis not present

## 2022-09-28 DIAGNOSIS — Z681 Body mass index (BMI) 19 or less, adult: Secondary | ICD-10-CM | POA: Diagnosis not present

## 2022-09-28 DIAGNOSIS — Z794 Long term (current) use of insulin: Secondary | ICD-10-CM

## 2022-09-28 DIAGNOSIS — E43 Unspecified severe protein-calorie malnutrition: Secondary | ICD-10-CM | POA: Diagnosis present

## 2022-09-28 DIAGNOSIS — D72829 Elevated white blood cell count, unspecified: Secondary | ICD-10-CM | POA: Diagnosis present

## 2022-09-28 DIAGNOSIS — Z7901 Long term (current) use of anticoagulants: Secondary | ICD-10-CM

## 2022-09-28 DIAGNOSIS — Z9889 Other specified postprocedural states: Secondary | ICD-10-CM

## 2022-09-28 DIAGNOSIS — Z79621 Long term (current) use of calcineurin inhibitor: Secondary | ICD-10-CM

## 2022-09-28 DIAGNOSIS — E11649 Type 2 diabetes mellitus with hypoglycemia without coma: Secondary | ICD-10-CM | POA: Diagnosis not present

## 2022-09-28 DIAGNOSIS — Z881 Allergy status to other antibiotic agents status: Secondary | ICD-10-CM

## 2022-09-28 DIAGNOSIS — Z880 Allergy status to penicillin: Secondary | ICD-10-CM

## 2022-09-28 DIAGNOSIS — Z7189 Other specified counseling: Secondary | ICD-10-CM | POA: Diagnosis not present

## 2022-09-28 LAB — COMPREHENSIVE METABOLIC PANEL
ALT: 9 U/L (ref 0–44)
AST: 17 U/L (ref 15–41)
Albumin: 2.2 g/dL — ABNORMAL LOW (ref 3.5–5.0)
Alkaline Phosphatase: 89 U/L (ref 38–126)
Anion gap: 10 (ref 5–15)
BUN: 77 mg/dL — ABNORMAL HIGH (ref 8–23)
CO2: 26 mmol/L (ref 22–32)
Calcium: 9.1 mg/dL (ref 8.9–10.3)
Chloride: 110 mmol/L (ref 98–111)
Creatinine, Ser: 2.13 mg/dL — ABNORMAL HIGH (ref 0.44–1.00)
GFR, Estimated: 24 mL/min — ABNORMAL LOW (ref >60)
Glucose, Bld: 100 mg/dL — ABNORMAL HIGH (ref 70–99)
Potassium: 4 mmol/L (ref 3.5–5.1)
Sodium: 146 mmol/L — ABNORMAL HIGH (ref 135–145)
Total Bilirubin: 0.4 mg/dL (ref 0.3–1.2)
Total Protein: 5.7 g/dL — ABNORMAL LOW (ref 6.5–8.1)

## 2022-09-28 LAB — CBC WITH DIFFERENTIAL/PLATELET
Abs Immature Granulocytes: 0.05 10*3/uL (ref 0.00–0.07)
Basophils Absolute: 0 10*3/uL (ref 0.0–0.1)
Basophils Relative: 0 %
Eosinophils Absolute: 0 10*3/uL (ref 0.0–0.5)
Eosinophils Relative: 0 %
HCT: 35.2 % — ABNORMAL LOW (ref 36.0–46.0)
Hemoglobin: 10.6 g/dL — ABNORMAL LOW (ref 12.0–15.0)
Immature Granulocytes: 0 %
Lymphocytes Relative: 7 %
Lymphs Abs: 0.8 10*3/uL (ref 0.7–4.0)
MCH: 29.9 pg (ref 26.0–34.0)
MCHC: 30.1 g/dL (ref 30.0–36.0)
MCV: 99.4 fL (ref 80.0–100.0)
Monocytes Absolute: 0.7 10*3/uL (ref 0.1–1.0)
Monocytes Relative: 6 %
Neutro Abs: 10 10*3/uL — ABNORMAL HIGH (ref 1.7–7.7)
Neutrophils Relative %: 87 %
Platelets: 245 10*3/uL (ref 150–400)
RBC: 3.54 MIL/uL — ABNORMAL LOW (ref 3.87–5.11)
RDW: 16.4 % — ABNORMAL HIGH (ref 11.5–15.5)
WBC: 11.6 10*3/uL — ABNORMAL HIGH (ref 4.0–10.5)
nRBC: 0 % (ref 0.0–0.2)

## 2022-09-28 LAB — GLUCOSE, CAPILLARY: Glucose-Capillary: 97 mg/dL (ref 70–99)

## 2022-09-28 LAB — TROPONIN I (HIGH SENSITIVITY)
Troponin I (High Sensitivity): 34 ng/L — ABNORMAL HIGH (ref <18)
Troponin I (High Sensitivity): 25 ng/L — ABNORMAL HIGH (ref <18)

## 2022-09-28 LAB — PROTIME-INR
INR: 1.7 — ABNORMAL HIGH (ref 0.8–1.2)
Prothrombin Time: 19.8 seconds — ABNORMAL HIGH (ref 11.4–15.2)

## 2022-09-28 LAB — BRAIN NATRIURETIC PEPTIDE: B Natriuretic Peptide: 646 pg/mL — ABNORMAL HIGH (ref 0.0–100.0)

## 2022-09-28 MED ORDER — OXYCODONE-ACETAMINOPHEN 5-325 MG PO TABS
1.0000 | ORAL_TABLET | ORAL | Status: DC | PRN
  Administered 2022-09-29 – 2022-09-30 (×2): 1 via ORAL
  Filled 2022-09-28 (×3): qty 1

## 2022-09-28 MED ORDER — TRAZODONE HCL 50 MG PO TABS
25.0000 mg | ORAL_TABLET | Freq: Every evening | ORAL | Status: DC | PRN
  Administered 2022-09-28 – 2022-09-29 (×2): 25 mg via ORAL
  Filled 2022-09-28 (×2): qty 1

## 2022-09-28 MED ORDER — FUROSEMIDE 10 MG/ML IJ SOLN
40.0000 mg | Freq: Once | INTRAMUSCULAR | Status: AC
  Administered 2022-09-28: 40 mg via INTRAVENOUS
  Filled 2022-09-28: qty 4

## 2022-09-28 MED ORDER — MYCOPHENOLATE MOFETIL 250 MG PO CAPS
500.0000 mg | ORAL_CAPSULE | Freq: Two times a day (BID) | ORAL | Status: DC
  Administered 2022-09-28 – 2022-09-30 (×4): 500 mg via ORAL
  Filled 2022-09-28 (×8): qty 2

## 2022-09-28 MED ORDER — DOCUSATE SODIUM 100 MG PO CAPS: 100.0000 mg | ORAL_CAPSULE | Freq: Two times a day (BID) | ORAL | Status: DC | PRN

## 2022-09-28 MED ORDER — HYDRALAZINE HCL 20 MG/ML IJ SOLN: 5.0000 mg | INTRAMUSCULAR | Status: DC | PRN

## 2022-09-28 MED ORDER — ACETAMINOPHEN 325 MG PO TABS
650.0000 mg | ORAL_TABLET | Freq: Four times a day (QID) | ORAL | Status: DC | PRN
  Administered 2022-09-28 – 2022-09-29 (×2): 650 mg via ORAL
  Filled 2022-09-28 (×2): qty 2

## 2022-09-28 MED ORDER — DM-GUAIFENESIN ER 30-600 MG PO TB12: 1.0000 | ORAL_TABLET | Freq: Two times a day (BID) | ORAL | Status: DC | PRN

## 2022-09-28 MED ORDER — TACROLIMUS 1 MG PO CAPS
1.0000 mg | ORAL_CAPSULE | Freq: Two times a day (BID) | ORAL | Status: DC
  Administered 2022-09-28 – 2022-10-01 (×5): 1 mg via ORAL
  Filled 2022-09-28 (×8): qty 1

## 2022-09-28 MED ORDER — ACETAMINOPHEN 500 MG PO TABS
1000.0000 mg | ORAL_TABLET | Freq: Once | ORAL | Status: AC
  Administered 2022-09-28: 1000 mg via ORAL
  Filled 2022-09-28: qty 2

## 2022-09-28 MED ORDER — VITAMIN C 500 MG PO TABS
500.0000 mg | ORAL_TABLET | Freq: Two times a day (BID) | ORAL | Status: DC
  Administered 2022-09-28 – 2022-09-30 (×4): 500 mg via ORAL
  Filled 2022-09-28 (×4): qty 1

## 2022-09-28 MED ORDER — FUROSEMIDE 40 MG PO TABS
40.0000 mg | ORAL_TABLET | Freq: Every day | ORAL | Status: DC
  Administered 2022-09-29 – 2022-09-30 (×2): 40 mg via ORAL
  Filled 2022-09-28 (×2): qty 1

## 2022-09-28 MED ORDER — INSULIN GLARGINE-YFGN 100 UNIT/ML ~~LOC~~ SOLN
4.0000 [IU] | Freq: Every day | SUBCUTANEOUS | Status: DC
  Administered 2022-09-28: 4 [IU] via SUBCUTANEOUS
  Filled 2022-09-28 (×2): qty 0.04

## 2022-09-28 MED ORDER — EZETIMIBE 10 MG PO TABS
10.0000 mg | ORAL_TABLET | Freq: Every day | ORAL | Status: DC
  Administered 2022-09-28 – 2022-09-30 (×3): 10 mg via ORAL
  Filled 2022-09-28 (×3): qty 1

## 2022-09-28 MED ORDER — ENSURE ENLIVE PO LIQD
237.0000 mL | Freq: Two times a day (BID) | ORAL | Status: DC
  Administered 2022-09-29: 237 mL via ORAL

## 2022-09-28 MED ORDER — INSULIN ASPART 100 UNIT/ML IJ SOLN: 0.0000 [IU] | Freq: Every day | INTRAMUSCULAR | Status: DC

## 2022-09-28 MED ORDER — ATORVASTATIN CALCIUM 10 MG PO TABS
10.0000 mg | ORAL_TABLET | Freq: Every day | ORAL | Status: DC
  Administered 2022-09-28 – 2022-09-30 (×3): 10 mg via ORAL
  Filled 2022-09-28 (×3): qty 1

## 2022-09-28 MED ORDER — ALBUTEROL SULFATE (2.5 MG/3ML) 0.083% IN NEBU: 3.0000 mL | INHALATION_SOLUTION | RESPIRATORY_TRACT | Status: DC | PRN

## 2022-09-28 MED ORDER — AMIODARONE HCL 200 MG PO TABS
200.0000 mg | ORAL_TABLET | Freq: Every day | ORAL | Status: DC
  Administered 2022-09-28 – 2022-09-30 (×3): 200 mg via ORAL
  Filled 2022-09-28 (×3): qty 1

## 2022-09-28 MED ORDER — ONDANSETRON HCL 4 MG/2ML IJ SOLN
4.0000 mg | Freq: Once | INTRAMUSCULAR | Status: AC
  Administered 2022-09-28: 4 mg via INTRAVENOUS
  Filled 2022-09-28: qty 2

## 2022-09-28 MED ORDER — INSULIN ASPART 100 UNIT/ML IJ SOLN: 0.0000 [IU] | Freq: Three times a day (TID) | INTRAMUSCULAR | Status: DC

## 2022-09-28 MED ORDER — DIPHENHYDRAMINE HCL 50 MG/ML IJ SOLN: 12.5000 mg | Freq: Three times a day (TID) | INTRAMUSCULAR | Status: DC | PRN

## 2022-09-28 NOTE — ED Provider Notes (Signed)
[facility] Provider Note    Event Date/Time   First MD Initiated Contact with Patient 09/28/22 1250     (approximate)   History   Chest Pain   HPI  Jean Johnson is a 72 y.o. female past medical history significant for ESRD status post renal transplant, CKD no longer on HD, hypertension, hyperlipidemia, hypothyroidism, paroxysmal atrial fibrillation on Eliquis, diabetes, presents to the emergency department with shortness of breath.  Patient had a recent hospitalization and states that she over the past 1 week she has had progressively worsening shortness of breath.  Feels "full" and like she cannot catch her breath.  States that minimal movement causes her to have increased work of breathing.  Does still make urine and states that she has been having symptoms of burning with urination.  Denies any chest pain.  Denies any nausea or vomiting.  States that she has been compliant with all of her home medications.     Physical Exam   Triage Vital Signs: ED Triage Vitals  Enc Vitals Group     BP 09/28/22 1224 (!) 114/53     Pulse Rate 09/28/22 1224 68     Resp 09/28/22 1224 18     Temp 09/28/22 1224 98.8 F (37.1 C)     Temp Source 09/28/22 1224 Oral     SpO2 09/28/22 1224 100 %     Weight 09/28/22 1227 107 lb (48.5 kg)     Height 09/28/22 1227 5\' 2"  (1.575 m)     Head Circumference --      Peak Flow --      Pain Score 09/28/22 1227 5     Pain Loc --      Pain Edu? --      Excl. in [place]? --     Most recent vital signs: Vitals:   09/28/22 1449 09/28/22 1600  BP: 122/65 126/80  Pulse: 82 89  Resp: 18 15  Temp: 98.6 F (37 C)   SpO2: 100% 98%    Physical Exam Constitutional:      Appearance: She is well-developed.  HENT:     Head: Atraumatic.  Eyes:     Conjunctiva/sclera: Conjunctivae normal.  Cardiovascular:     Rate and Rhythm: Regular rhythm.  Pulmonary:     Effort: No respiratory distress.  Abdominal:     General: There is no  distension.     Palpations: Abdomen is soft.  Musculoskeletal:        General: Normal range of motion.     Cervical back: Normal range of motion.     Right lower leg: Edema present.     Left lower leg: Edema present.     Comments: Trace edema to bilateral lower extremities.  Left upper extremity with palpable AV fistula.  No unilateral leg swelling.  Skin:    General: Skin is warm.     Capillary Refill: Capillary refill takes less than 2 seconds.  Neurological:     General: No focal deficit present.     Mental Status: She is alert. Mental status is at baseline.     IMPRESSION / MDM / ASSESSMENT AND PLAN / ED COURSE  I reviewed the triage vital signs and the nursing notes.  Differential diagnosis including pulmonary edema, pleural effusions, ACS, anemia, pneumonia EKG  I, Nathaniel Man, the attending physician, personally viewed and interpreted this ECG.   Rate: Normal  Rhythm: Normal sinus  Axis: Normal  Intervals: Normal  ST&T Change: None  No tachycardic or bradycardic dysrhythmias while on cardiac telemetry.  RADIOLOGY I independently reviewed imaging, my interpretation of imaging: Left pleural effusion that looks similar to when she needed admission during her last hospitalization.  Read as progressive left pleural effusion similar to 09/08/2022 exam.  Mild pulmonary edema.  LABS (all labs ordered are listed, but only abnormal results are displayed) Labs interpreted as -    Labs Reviewed  BRAIN NATRIURETIC PEPTIDE - Abnormal; Notable for the following components:      Result Value   B Natriuretic Peptide 646.0 (*)    All other components within normal limits  PROTIME-INR - Abnormal; Notable for the following components:   Prothrombin Time 19.8 (*)    INR 1.7 (*)    All other components within normal limits  CBC WITH DIFFERENTIAL/PLATELET - Abnormal; Notable for the following components:   WBC 11.6 (*)    RBC 3.54 (*)    Hemoglobin 10.6 (*)    HCT 35.2 (*)     RDW 16.4 (*)    Neutro Abs 10.0 (*)    All other components within normal limits  COMPREHENSIVE METABOLIC PANEL - Abnormal; Notable for the following components:   Sodium 146 (*)    Glucose, Bld 100 (*)    BUN 77 (*)    Creatinine, Ser 2.13 (*)    Total Protein 5.7 (*)    Albumin 2.2 (*)    GFR, Estimated 24 (*)    All other components within normal limits  TROPONIN I (HIGH SENSITIVITY) - Abnormal; Notable for the following components:   Troponin I (High Sensitivity) 34 (*)    All other components within normal limits  CBC WITH DIFFERENTIAL/PLATELET  URINALYSIS, W/ REFLEX TO CULTURE (INFECTION SUSPECTED)  TROPONIN I (HIGH SENSITIVITY)    TREATMENT  IV Zofran, IV Lasix  MDM   On chart review patient had a recent hospitalization where she had significant pleural effusion and pulmonary edema.  Patient was evaluated by cardiology and nephrology at that time.  At 1 point she was on a Lasix infusion.  I do not see that she had a thoracentesis.  Clinical picture concerning for recurrent pleural effusion.  Given IV Lasix.  Patient will likely need admission.  Currently waiting for lab work to return.  Initial troponin mildly elevated.  No significant anemia.  PROCEDURE NOTE: IV Placement under Ultrasound Guidance Performed by: Nathaniel Man, MD (myself) Indication: IV access required. Multiple attempts at peripheral IV placement were made by the nursing staff without success   Procedure: The area was prepped in the usual fashion. The right AC was cannulated with a 20 gauge angiocath with use of dynamic  ultrasound to identify the vein. The patient tolerated the procedure  well. Imaging not saved.  Complications: none     PROCEDURES:  Critical Care performed: No  Procedures  Patient's presentation is most consistent with acute presentation with potential threat to life or bodily function.   MEDICATIONS ORDERED IN ED: Medications  furosemide (LASIX) injection 40 mg (has no  administration in time range)  ondansetron (ZOFRAN) injection 4 mg (4 mg Intravenous Given 09/28/22 1605)    FINAL CLINICAL IMPRESSION(S) / ED DIAGNOSES   Final diagnoses:  Dyspnea, unspecified type  Pleural effusion     Rx / DC Orders   ED Discharge Orders     None        Note:  This document was prepared using Dragon voice recognition software and may include unintentional  dictation errors.   Nathaniel Man, MD 09/28/22 [phone]

## 2022-09-28 NOTE — ED Notes (Signed)
This RN attempted IV access without success - Primary RN made aware. Green tube sent to lab

## 2022-09-28 NOTE — ED Notes (Signed)
Attempted IV start and blood draw, unsuccessful. Another RN will try.

## 2022-09-28 NOTE — ED Provider Notes (Signed)
5:10 PM Assumed care for off going team.   Blood pressure 126/80, pulse 89, temperature 98.6 F (37 C), resp. rate 15, height 5\' 2"  (1.575 m), weight 48.5 kg, SpO2 98 %.  See their HPI for full report but in brief pending labs    Labs show stable declining troponin and BNP at baseline INR slightly elevated CBC stable hemoglobin but x-ray does show new pleural effusion therefore will discuss with hospital team for admission for consideration of thoracentesis    Vanessa Charenton, MD 09/28/22 1711

## 2022-09-28 NOTE — ED Triage Notes (Signed)
Pt bib EMS from [facility] c/o intermittent chest pain and SOB for 1 hour, worse with exertion. Refused ASA, refused nitro with EMS. Here for same 3 weeks ago. AO x 4.   Per pt, her chest pain and SOB didn't go away when she was dcd from here 3 weeks ago. Pt states she has chronic SOB.  134/54 HR 80 99% on 3LNC, chronic CBG 120

## 2022-09-28 NOTE — H&P (Signed)
History and Physical    Jean Johnson [id number] DOB: 1950-10-09 DOA: 09/28/2022  Referring MD/NP/PA:   PCP: Gladstone Lighter, MD   Patient coming from:  The patient is coming from SNF  Chief Complaint: SOB  HPI: Jean Johnson is a 72 y.o. female with medical history significant of ESRD s/p renal transplant x 2 now having CKD stage IIIb, hypertension, dyslipidemia, hypothyroidism, PAF on anticoagulation with Eliquis, PUD, type 2 diabetes mellitus with diabetic neuropathy as well as other comorbidities who presented to the ED with SOB.  Pt was recently hospitalized from 2/27 - 3/15 due to CHF exacerbation.  Patient is discharged to nursing home for rehab.  Patient states that in the past several days, she has progressively worsening shortness breath.  Patient does not have cough, fever or chills.  Patient reports left-sided chest pain, which is sharp, pleuritic, 7 out of 10 in severity, nonradiating.  Patient has nausea, no vomiting, diarrhea or abdominal pain.  No symptoms of UTI.  She took her last dose of Eliquis at 11:33 on 09/27/2022.  Data reviewed independently and ED Course: pt was found to have WBC 11.6, BNP 646, trop  34 --> 25, stable renal function compared to recent baseline creatinine, INR 1.7, temperature normal, soft blood pressure 97/73, heart rate 89, RR 18, oxygen saturation 100% on room air.  Chest x-ray showed large left pleural effusion with mild interstitial edema.  Patient is elevated as inpatient.  EKG: I have personally reviewed.  A flutter, QTc 526, RAD, poor R wave progression.   Review of Systems:   General: no fevers, chills, no body weight gain, has fatigue HEENT: no blurry vision, hearing changes or sore throat Respiratory: has dyspnea, no coughing, wheezing CV: has chest pain, no palpitations GI: has nausea, no vomiting, abdominal pain, diarrhea, constipation GU: no dysuria, burning on urination, increased urinary frequency, hematuria   Ext: no leg edema Neuro: no unilateral weakness, numbness, or tingling, no vision change or hearing loss Skin: has sacral ulcer. MSK: No muscle spasm, no deformity, no limitation of range of movement in spin Heme: No easy bruising.  Travel history: No recent long distant travel.   Allergy:  Allergies  Allergen Reactions   E-Mycin [Erythromycin] Swelling    And itching    Penicillins Swelling    Throat Tolerated 1st generation cephalosporin (CEFAZOLIN) on 03/24/2021 without documented ADRs.  No reaction with Ancef on 08/30/2022    Past Medical History:  Diagnosis Date   Anemia of chronic renal disease    Aortic stenosis, mild    a.) TTE on 11/23/2020 --> mean gradient 9.7 mmHg   Atrial fibrillation ([place])    a.) CHA2DS2VASc = 4 (age, sex, HTN, T2DM);  b.) rate/rhythm maintained on oral diltiazem + flecainide; chronically anticoagulated with apixaban   B12 deficiency    Cervical spinal stenosis    Diabetic neuropathy (HCC)    Diverticulosis    Dyspnea    ESRD (end stage renal disease) (HCC)    First degree AV block    Gastrointestinal ulcer    HLD (hyperlipidemia)    Hypertension    Hypothyroidism    Incomplete right bundle branch block (RBBB)    LAE (left atrial enlargement)    a.) TTE on 11/23/2020 --> moderate   Left thyroid nodule    a.) cervical MRI on 12/29/2020 --> measured "at least" 3.5 cm; incompletely imaged.   Long term current use of anticoagulant    a.) apixaban   Long-term  use of immunosuppressant medication    a.) takes daily mycophenolate, tacrolimus, prednisone   Murmur    Nephrolithiasis    Osteoporosis    Perianal fistula    PONV (postoperative nausea and vomiting)    Post-transplant diabetes mellitus ([place])    Pyelonephritis    Renal transplant recipient    a.) living donor transplant from sister on 12/26/1998; rejected organ in 2006 and restarted on hemodialysis. b.) cadaveric organ recipient on 02/04/2009; located in LEFT lower abdominal  quadrant.   Valvular regurgitation    a.) TTE 11/23/2020 --> mild panvalvular regurgitation; b.) TTE 04/29/2022: mild MR    Past Surgical History:  Procedure Laterality Date   A/V FISTULAGRAM Left    ANTERIOR CERVICAL DECOMP/DISCECTOMY FUSION N/A 03/24/2021   Procedure: C3-6 ANTERIOR CERVICAL DECOMPRESSION/DISCECTOMY FUSION 3 LEVELS;  Surgeon: Meade Maw, MD;  Location: [facility];  Service: Neurosurgery;  Laterality: N/A;   ARTERY BIOPSY Right 08/30/2022   Procedure: BIOPSY TEMPORAL ARTERY;  Surgeon: Katha Cabal, MD;  Location: [facility];  Service: Vascular;  Laterality: Right;   CARPAL TUNNEL RELEASE Right 2016   CATARACT EXTRACTION W/ INTRAOCULAR LENS IMPLANT Right 03/2020   CATARACT EXTRACTION W/PHACO Left 10/21/2020   Procedure: CATARACT EXTRACTION PHACO AND INTRAOCULAR LENS PLACEMENT ([place]) DIABETES LEFT;  Surgeon: Leandrew Koyanagi, MD;  Location: [facility];  Service: Ophthalmology;  Laterality: Left;  2.84 0:48.8 5.8%   COLONOSCOPY WITH PROPOFOL N/A 09/03/2021   Procedure: COLONOSCOPY WITH PROPOFOL;  Surgeon: Lesly Rubenstein, MD;  Location: [facility];  Service: Endoscopy;  Laterality: N/A;  DM, ELIQUIS, WHEELCHAIR   EYE SURGERY Right 2020   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   Living donor organ recipient (sister)   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   Cadaveric organ recipient    Social History:  reports that she has never smoked. She has never used smokeless tobacco. She reports that she does not currently use alcohol. She reports that she does not use drugs.  Family History:  Family History  Problem Relation Age of Onset   Diabetes Father    Heart disease Father      Prior to Admission medications   Medication Sig Start Date End Date Taking? Authorizing Provider  acetaminophen (TYLENOL) 325 MG tablet Take 2 tablets (650 mg total) by mouth every 6 (six) hours as needed for mild pain (or Fever >/= 101). 09/23/22   Verline Lema, MD   amiodarone (PACERONE) 200 MG tablet Take 1 tablet (200 mg total) by mouth 2 (two) times daily. 09/23/22   Verline Lema, MD  amiodarone (PACERONE) 200 MG tablet Take 1 tablet (200 mg total) by mouth daily. 09/27/22   Verline Lema, MD  apixaban (ELIQUIS) 2.5 MG TABS tablet Take 1 tablet (2.5 mg total) by mouth 2 (two) times daily. 09/23/22   Verline Lema, MD  ascorbic acid (VITAMIN C) 500 MG tablet Take 1 tablet (500 mg total) by mouth 2 (two) times daily. 09/23/22   Verline Lema, MD  atorvastatin (LIPITOR) 10 MG tablet Take 10 mg by mouth daily.    [provider]  diltiazem (CARDIZEM) 60 MG tablet Take 1 tablet (60 mg total) by mouth every 12 (twelve) hours. 09/23/22   Verline Lema, MD  docusate sodium (COLACE) 100 MG capsule Take 1 capsule (100 mg total) by mouth 2 (two) times daily. 09/23/22   Verline Lema, MD  ezetimibe (ZETIA) 10 MG tablet Take 10 mg by mouth daily.    [provider]  furosemide (LASIX) 40 MG tablet Take 1 tablet (40 mg total) by mouth daily. 09/24/22   Verline Lema, MD  insulin detemir (LEVEMIR) 100 UNIT/ML injection Inject 8 Units into the skin at bedtime.    [provider]  insulin lispro (HUMALOG) 100 UNIT/ML injection Inject 5 Units into the skin 2 (two) times daily.    [provider]  mycophenolate (CELLCEPT) 500 MG tablet Take 500 mg by mouth 2 (two) times daily.    [provider]  ondansetron (ZOFRAN) 4 MG tablet Take 1 tablet (4 mg total) by mouth every 6 (six) hours as needed for nausea. 09/23/22   Verline Lema, MD  pregabalin (LYRICA) 50 MG capsule Take 1 capsule (50 mg total) by mouth daily. 09/23/22   Verline Lema, MD  tacrolimus (PROGRAF) 1 MG capsule Take 1 capsule (1 mg total) by mouth 2 (two) times daily. 09/23/22   Verline Lema, MD  traZODone (DESYREL) 50 MG tablet Take 0.5 tablets (25 mg total) by mouth at bedtime as needed for sleep. 05/01/22   Emeterio Reeve, DO    Physical Exam: Vitals:    09/28/22 1227 09/28/22 1449 09/28/22 1600 09/28/22 1700  BP:  122/65 126/80 (!) 140/76  Pulse:  82 89 92  Resp:  18 15 16   Temp:  98.6 F (37 C)    TempSrc:      SpO2:  100% 98% 98%  Weight: 48.5 kg     Height: 5\' 2"  (1.575 m)      General: Not in acute distress HEENT:       Eyes: PERRL, EOMI, no scleral icterus.       ENT: No discharge from the ears and nose, no pharynx injection, no tonsillar enlargement.        Neck: No JVD, no bruit, no mass felt. Heme: No neck lymph node enlargement. Cardiac: S1/S2, RRR, No gallops or rubs. Respiratory: has decreased air movement on the left side GI: Soft, nondistended, nontender, no rebound pain, no organomegaly, BS present. GU: No hematuria Ext: No pitting leg edema bilaterally. 1+DP/PT pulse bilaterally. Musculoskeletal: No joint deformities, No joint redness or warmth, no limitation of ROM in spin. Skin: has stage IV sacral ulcer   Neuro: Alert, oriented X3, cranial nerves II-XII grossly intact, moves all extremities. Psych: Patient is not psychotic, no suicidal or hemocidal ideation.  Labs on Admission: I have personally reviewed following labs and imaging studies  CBC: Recent Labs  Lab 09/21/22 1952 09/28/22 1444  WBC 9.8 11.6*  NEUTROABS 8.0* 10.0*  HGB 9.0* 10.6*  HCT 29.7* 35.2*  MCV 100.0 99.4  PLT 205 [id number]   Basic Metabolic Panel: Recent Labs  Lab 09/22/22 0658 09/23/22 0648 09/24/22 0434 09/28/22 1444  NA 146* 150* 146* 146*  K 4.0 3.9 4.0 4.0  CL 115* 115* 115* 110  CO2 23 22 25 26   GLUCOSE 94 98 125* 100*  BUN 85* 94* 94* 77*  CREATININE 3.12* 2.93* 2.61* 2.13*  CALCIUM 8.5* 8.7* 8.5* 9.1   GFR: Estimated Creatinine Clearance: 18 mL/min (A) (by C-G formula based on SCr of 2.13 mg/dL (H)). Liver Function Tests: Recent Labs  Lab 09/28/22 1444  AST 17  ALT 9  ALKPHOS 89  BILITOT 0.4  PROT 5.7*  ALBUMIN 2.2*   No results for input(s): "LIPASE", "AMYLASE" in the last 168 hours. No results for  input(s): "AMMONIA" in the last 168 hours. Coagulation Profile: Recent Labs  Lab 09/28/22 1444  INR  1.7*   Cardiac Enzymes: No results for input(s): "CKTOTAL", "CKMB", "CKMBINDEX", "TROPONINI" in the last 168 hours. BNP (last 3 results) No results for input(s): "PROBNP" in the last 8760 hours. HbA1C: No results for input(s): "HGBA1C" in the last 72 hours. CBG: Recent Labs  Lab 09/22/22 2115 09/23/22 2116  GLUCAP 129* 107*   Lipid Profile: No results for input(s): "CHOL", "HDL", "LDLCALC", "TRIG", "CHOLHDL", "LDLDIRECT" in the last 72 hours. Thyroid Function Tests: No results for input(s): "TSH", "T4TOTAL", "FREET4", "T3FREE", "THYROIDAB" in the last 72 hours. Anemia Panel: No results for input(s): "VITAMINB12", "FOLATE", "FERRITIN", "TIBC", "IRON", "RETICCTPCT" in the last 72 hours. Urine analysis:    Component Value Date/Time   COLORURINE YELLOW (A) 09/20/2022 0944   APPEARANCEUR CLOUDY (A) 09/20/2022 0944   APPEARANCEUR Hazy (A) 11/16/2020 1344   LABSPEC 1.011 09/20/2022 0944   PHURINE 5.0 09/20/2022 0944   GLUCOSEU NEGATIVE 09/20/2022 0944   HGBUR NEGATIVE 09/20/2022 0944   BILIRUBINUR NEGATIVE 09/20/2022 0944   BILIRUBINUR Negative 11/16/2020 1344   KETONESUR NEGATIVE 09/20/2022 0944   PROTEINUR 100 (A) 09/20/2022 0944   NITRITE NEGATIVE 09/20/2022 0944   LEUKOCYTESUR LARGE (A) 09/20/2022 0944   Sepsis Labs: @LABRCNTIP (procalcitonin:4,lacticidven:4) ) Recent Results (from the past 240 hour(s))  Urine Culture (for pregnant, neutropenic or urologic patients or patients with an indwelling urinary catheter)     Status: Abnormal   Collection Time: 09/20/22  9:44 AM   Specimen: Urine, Clean Catch  Result Value Ref Range Status   Specimen Description   Final    URINE, CLEAN CATCH Performed at [facility], [address] [place], [place] [address]    Special Requests   Final    NONE Performed at [facility], [address], [place],  [place] [address]    Culture >=100,000 COLONIES/mL ESCHERICHIA COLI (A)  Final   Report Status 09/23/2022 FINAL  Final   Organism ID, Bacteria ESCHERICHIA COLI (A)  Final      Susceptibility   Escherichia coli - MIC*    AMPICILLIN 4 SENSITIVE Sensitive     CEFAZOLIN <=4 SENSITIVE Sensitive     CEFEPIME <=0.12 SENSITIVE Sensitive     CEFTRIAXONE <=0.25 SENSITIVE Sensitive     CIPROFLOXACIN <=0.25 SENSITIVE Sensitive     GENTAMICIN <=1 SENSITIVE Sensitive     IMIPENEM <=0.25 SENSITIVE Sensitive     NITROFURANTOIN <=16 SENSITIVE Sensitive     TRIMETH/SULFA <=20 SENSITIVE Sensitive     AMPICILLIN/SULBACTAM <=2 SENSITIVE Sensitive     PIP/TAZO <=4 SENSITIVE Sensitive     * >=100,000 COLONIES/mL ESCHERICHIA COLI     Radiological Exams on Admission: DG Chest 1 View  Result Date: 09/28/2022 CLINICAL DATA:  Dyspnea, intermittent chest pain EXAM: CHEST  1 VIEW COMPARISON:  09/20/2022 FINDINGS: Single frontal view of the chest demonstrates interval increase in left pleural effusion and left basilar consolidation. Cardiac silhouette is obscured. There is increased central vascular congestion, with patchy ground-glass airspace disease seen within the aerated lungs bilaterally. No pneumothorax. No acute bony abnormality. IMPRESSION: 1. Progressive left pleural effusion and left basilar atelectasis since prior exam. Similar appearance to the 09/08/2022 exam. 2. Mild pulmonary edema. Electronically Signed   By: Randa Ngo M.D.   On: 09/28/2022 13:52      Assessment/Plan Principal Problem:   Pleural effusion on left Active Problems:   Chronic diastolic CHF (congestive heart failure) (HCC)   Myocardial injury   Atrial fibrillation, chronic (HCC)   Chronic kidney disease, stage 3b ([place])  Essential hypertension   HLD (hyperlipidemia)   Anemia in CKD (chronic kidney disease)   Kidney transplant status   Type II diabetes mellitus with renal manifestations (HCC)   Stage IV pressure ulcer of sacral  region (HCC)   Protein-calorie malnutrition, severe   Assessment and Plan:  Pleural effusion on left: Patient's short breath is likely due to large left pleural effusion.  Patient has mild leukocytosis 11.6, but no fever.  Clinically does not seem to have sepsis.  Will hold off antibiotics.  -Admitted to telemetry bed as inpatient -As needed albuterol -Ordered thoracentesis -Follow-up fluid analysis  Chronic diastolic CHF (congestive heart failure) ([place]): 2D echo on 09/08/2022 showed EF of 60 to 65% with grade 1 diastolic dysfunction.  Patient has elevated BNP 646, but no leg edema or JVD.  Does not seem to have CHF exacerbation. -Patient received 40 mg of Lasix IV in the ED, will not continue IV Lasix. -: [place] home Lasix 40 mg daily  Myocardial injury: Troponin level 34 --> 25.  Patient reports pleuritic chest pain, which is likely due to left pleural effusion. -Continue Lipitor -Will not give aspirin since patient is on Eliquis  Atrial fibrillation, chronic ([place]): Heart rate 89 -Continue amiodarone -Hold Cardizem due to soft blood pressure -Hold Eliquis today for thoracentesis  Chronic kidney disease, stage 3b ([place]): Recent creatinine 2.61 on 09/24/2022.  Today her creatinine is 2.13, BUN 77, GFR 24.  Close to recent baseline -Follow-up with BMP  Essential hypertension: Blood pressure is soft -IV hydralazine as needed -Hold Cardizem -Patient is on Lasix  HLD (hyperlipidemia) -Lipitor  Anemia in CKD (chronic kidney disease): Hemoglobin 10.6 (9.0 09/21/2022), stable -Follow-up with CBC  Kidney transplant status -Continue home Prograf and CellCept  Type II diabetes mellitus with renal manifestations [facility]): Recent A1c 5.7, well-controlled.  Patient taking Levemir 80 units daily, Humalog -Sliding scale insulin -Glargine insulin 4 units daily  Stage IV pressure ulcer of sacral region ([place]) -Consult wound care  Protein-calorie malnutrition, severe -Ensure -Consult  nutrition     DVT ppx: SCD  Code Status: DNR (I discussed with patient in the presence of her sister-in law, and explained the meaning of CODE STATUS. Patient wants to be DNR)  Family Communication:  Yes, patient's sister-in law at bed side.    Disposition Plan:  Anticipate discharge back to previous environment  Consults called:  none  Admission status and Level of care: Telemetry Cardiac:     as inpt     Dispo: The patient is from: SNF              Anticipated d/c is to: SNF              Anticipated d/c date is: 2 days              Patient currently is not medically stable to d/c.    Severity of Illness:  The appropriate patient status for this patient is INPATIENT. Inpatient status is judged to be reasonable and necessary in order to provide the required intensity of service to ensure the patient's safety. The patient's presenting symptoms, physical exam findings, and initial radiographic and laboratory data in the context of their chronic comorbidities is felt to place them at high risk for further clinical deterioration. Furthermore, it is not anticipated that the patient will be medically stable for discharge from the hospital within 2 midnights of admission.   * I certify that at the point of admission it is my clinical  judgment that the patient will require inpatient hospital care spanning beyond 2 midnights from the point of admission due to high intensity of service, high risk for further deterioration and high frequency of surveillance required.*       Date of Service 09/28/2022    Ivor Costa [facility]   If 7PM-7AM, please contact night-coverage [email or web address] 09/28/2022, 6:47 PM

## 2022-09-29 ENCOUNTER — Inpatient Hospital Stay: Payer: Medicare Other

## 2022-09-29 DIAGNOSIS — I10 Essential (primary) hypertension: Secondary | ICD-10-CM

## 2022-09-29 DIAGNOSIS — L89154 Pressure ulcer of sacral region, stage 4: Secondary | ICD-10-CM

## 2022-09-29 DIAGNOSIS — D631 Anemia in chronic kidney disease: Secondary | ICD-10-CM

## 2022-09-29 DIAGNOSIS — I482 Chronic atrial fibrillation, unspecified: Secondary | ICD-10-CM | POA: Diagnosis not present

## 2022-09-29 DIAGNOSIS — I5A Non-ischemic myocardial injury (non-traumatic): Secondary | ICD-10-CM | POA: Diagnosis not present

## 2022-09-29 DIAGNOSIS — I5032 Chronic diastolic (congestive) heart failure: Secondary | ICD-10-CM | POA: Diagnosis not present

## 2022-09-29 DIAGNOSIS — J90 Pleural effusion, not elsewhere classified: Secondary | ICD-10-CM | POA: Diagnosis not present

## 2022-09-29 LAB — GLUCOSE, CAPILLARY
Glucose-Capillary: 33 mg/dL — CL (ref 70–99)
Glucose-Capillary: 61 mg/dL — ABNORMAL LOW (ref 70–99)
Glucose-Capillary: 60 mg/dL — ABNORMAL LOW (ref 70–99)
Glucose-Capillary: 60 mg/dL — ABNORMAL LOW (ref 70–99)
Glucose-Capillary: 67 mg/dL — ABNORMAL LOW (ref 70–99)
Glucose-Capillary: 146 mg/dL — ABNORMAL HIGH (ref 70–99)
Glucose-Capillary: 130 mg/dL — ABNORMAL HIGH (ref 70–99)
Glucose-Capillary: 128 mg/dL — ABNORMAL HIGH (ref 70–99)
Glucose-Capillary: 100 mg/dL — ABNORMAL HIGH (ref 70–99)

## 2022-09-29 LAB — BASIC METABOLIC PANEL
Anion gap: 7 (ref 5–15)
BUN: 80 mg/dL — ABNORMAL HIGH (ref 8–23)
CO2: 24 mmol/L (ref 22–32)
Calcium: 8.6 mg/dL — ABNORMAL LOW (ref 8.9–10.3)
Chloride: 115 mmol/L — ABNORMAL HIGH (ref 98–111)
Creatinine, Ser: 2.27 mg/dL — ABNORMAL HIGH (ref 0.44–1.00)
GFR, Estimated: 22 mL/min — ABNORMAL LOW (ref >60)
Glucose, Bld: 70 mg/dL (ref 70–99)
Potassium: 4 mmol/L (ref 3.5–5.1)
Sodium: 146 mmol/L — ABNORMAL HIGH (ref 135–145)

## 2022-09-29 LAB — BODY FLUID CELL COUNT WITH DIFFERENTIAL
Eos, Fluid: 0 %
Lymphs, Fluid: 23 %
Monocyte-Macrophage-Serous Fluid: 38 %
Neutrophil Count, Fluid: 39 %
Total Nucleated Cell Count, Fluid: 334 cu mm

## 2022-09-29 LAB — GLUCOSE, PLEURAL OR PERITONEAL FLUID: Glucose, Fluid: 172 mg/dL

## 2022-09-29 LAB — LACTATE DEHYDROGENASE, PLEURAL OR PERITONEAL FLUID: LD, Fluid: 61 U/L — ABNORMAL HIGH (ref 3–23)

## 2022-09-29 LAB — CBC
HCT: 30.3 % — ABNORMAL LOW (ref 36.0–46.0)
Hemoglobin: 9.1 g/dL — ABNORMAL LOW (ref 12.0–15.0)
MCH: 30.3 pg (ref 26.0–34.0)
MCHC: 30 g/dL (ref 30.0–36.0)
MCV: 101 fL — ABNORMAL HIGH (ref 80.0–100.0)
Platelets: 197 10*3/uL (ref 150–400)
RBC: 3 MIL/uL — ABNORMAL LOW (ref 3.87–5.11)
RDW: 16.6 % — ABNORMAL HIGH (ref 11.5–15.5)
WBC: 11.1 10*3/uL — ABNORMAL HIGH (ref 4.0–10.5)
nRBC: 0 % (ref 0.0–0.2)

## 2022-09-29 LAB — LACTATE DEHYDROGENASE: LDH: 120 U/L (ref 98–192)

## 2022-09-29 LAB — PROTEIN, PLEURAL OR PERITONEAL FLUID: Total protein, fluid: <3 g/dL

## 2022-09-29 LAB — ALBUMIN, PLEURAL OR PERITONEAL FLUID: Albumin, Fluid: <1.5 g/dL

## 2022-09-29 LAB — MAGNESIUM: Magnesium: 2.4 mg/dL (ref 1.7–2.4)

## 2022-09-29 MED ORDER — DEXTROSE 50 % IV SOLN
INTRAVENOUS | Status: AC
  Filled 2022-09-29: qty 50

## 2022-09-29 MED ORDER — ADULT MULTIVITAMIN W/MINERALS CH
1.0000 | ORAL_TABLET | Freq: Every day | ORAL | Status: DC
  Administered 2022-09-29 – 2022-09-30 (×2): 1 via ORAL
  Filled 2022-09-29 (×2): qty 1

## 2022-09-29 MED ORDER — INSULIN ASPART 100 UNIT/ML IJ SOLN: 0.0000 [IU] | Freq: Three times a day (TID) | INTRAMUSCULAR | Status: DC

## 2022-09-29 MED ORDER — LIDOCAINE HCL (PF) 1 % IJ SOLN
10.0000 mL | Freq: Once | INTRAMUSCULAR | Status: AC
  Administered 2022-09-29: 10 mL via INTRADERMAL

## 2022-09-29 MED ORDER — ENSURE ENLIVE PO LIQD
237.0000 mL | Freq: Three times a day (TID) | ORAL | Status: DC
  Administered 2022-09-29 – 2022-09-30 (×3): 237 mL via ORAL

## 2022-09-29 NOTE — Assessment & Plan Note (Signed)
Patient with large left pleural effusion which can be contributory to her shortness of breath.  Persistent leukocytosis but no fever. Thoracentesis was ordered-still pending. -Follow-up fluid analysis

## 2022-09-29 NOTE — Progress Notes (Signed)
[facility]  ROUNDING NOTE   Subjective:   Jean Johnson is a 72 y.o. female with past medical conditions including diabetes, atrial fibrillation on Eliquis, dyslipidemia, hypertension, end-stage renal disease status post renal transplant x 2, diabetic neuropathy, and PUD.  Patient presents to the emergency department with complaints of shortness of breath.  Patient has been admitted for Pleural effusion [J90] Pleural effusion on left [J90] Dyspnea, unspecified type [R06.00]  Patient is known to our practice and is followed outpatient by Jean Johnson.  Patient was last seen in office on 08/23/2022 for routine follow-up.  Patient was recently discharged from hospital to a rehab facility. She is seen today resting in bed with husband at bedside. They states she has progressively developed shortness of breath. Appetite remains poor without nausea and vomiting. Denies shortness of breath.   Labs on ED arrival significant for sodium 146, BUN 77, creatinine 2.13 with GFR 24, BNP 646 and troponin 34. Hgb 10.6.Chest xray shows mild pulmonary edema.   We have been consulted to manage chronic kidney disease.   Objective:  Vital signs in last 24 hours:  Temp:  [97.5 F (36.4 C)-98.6 F (37 C)] 98 F (36.7 C) (03/21 1148) Pulse Rate:  [60-92] 63 (03/21 1148) Resp:  [15-18] 16 (03/21 1148) BP: (93-140)/(48-87) 93/48 (03/21 1148) SpO2:  [98 %-100 %] 100 % (03/21 0434)  Weight change:  Filed Weights   09/28/22 1227  Weight: 48.5 kg    Intake/Output: No intake/output data recorded.   Intake/Output this shift:  Total I/O In: 140 [P.O.:140] Out: -   Physical Exam: General: NAD  Head: Normocephalic, atraumatic. Moist oral mucosal membranes  Eyes: Anicteric  Lungs:  Clear to auscultation, normal effort  Heart: Regular rate and rhythm  Abdomen:  Soft, nontender, nondistended  Extremities: Trace peripheral edema.  Neurologic: Nonfocal, moving all four extremities  Skin:  No lesions  Access: None    Basic Metabolic Panel: Recent Labs  Lab 09/23/22 0648 09/24/22 0434 09/28/22 1444 09/29/22 0631  NA 150* 146* 146* 146*  K 3.9 4.0 4.0 4.0  CL 115* 115* 110 115*  CO2 22 25 26 24   GLUCOSE 98 125* 100* 70  BUN 94* 94* 77* 80*  CREATININE 2.93* 2.61* 2.13* 2.27*  CALCIUM 8.7* 8.5* 9.1 8.6*  MG  --   --   --  2.4     Liver Function Tests: Recent Labs  Lab 09/28/22 1444  AST 17  ALT 9  ALKPHOS 89  BILITOT 0.4  PROT 5.7*  ALBUMIN 2.2*    No results for input(s): "LIPASE", "AMYLASE" in the last 168 hours. No results for input(s): "AMMONIA" in the last 168 hours.  CBC: Recent Labs  Lab 09/28/22 1444 09/29/22 0631  WBC 11.6* 11.1*  NEUTROABS 10.0*  --   HGB 10.6* 9.1*  HCT 35.2* 30.3*  MCV 99.4 101.0*  PLT 245 197     Cardiac Enzymes: No results for input(s): "CKTOTAL", "CKMB", "CKMBINDEX", "TROPONINI" in the last 168 hours.  BNP: Invalid input(s): "POCBNP"  CBG: Recent Labs  Lab 09/29/22 0947 09/29/22 1007 09/29/22 1009 09/29/22 1124 09/29/22 1209  GLUCAP 67* 60* 60* 146* 130*     Microbiology: Results for orders placed or performed during the hospital encounter of 09/06/22  Urine Culture (for pregnant, neutropenic or urologic patients or patients with an indwelling urinary catheter)     Status: Abnormal   Collection Time: 09/20/22  9:44 AM   Specimen: Urine, Clean Catch  Result  Value Ref Range Status   Specimen Description   Final    URINE, CLEAN CATCH Performed at [facility], [address] [place], [place] [address]    Special Requests   Final    NONE Performed at [facility], [place], [place] [address]    Culture >=100,000 COLONIES/mL ESCHERICHIA COLI (A)  Final   Report Status 09/23/2022 FINAL  Final   Organism ID, Bacteria ESCHERICHIA COLI (A)  Final      Susceptibility   Escherichia coli - MIC*    AMPICILLIN 4 SENSITIVE Sensitive     CEFAZOLIN <=4 SENSITIVE  Sensitive     CEFEPIME <=0.12 SENSITIVE Sensitive     CEFTRIAXONE <=0.25 SENSITIVE Sensitive     CIPROFLOXACIN <=0.25 SENSITIVE Sensitive     GENTAMICIN <=1 SENSITIVE Sensitive     IMIPENEM <=0.25 SENSITIVE Sensitive     NITROFURANTOIN <=16 SENSITIVE Sensitive     TRIMETH/SULFA <=20 SENSITIVE Sensitive     AMPICILLIN/SULBACTAM <=2 SENSITIVE Sensitive     PIP/TAZO <=4 SENSITIVE Sensitive     * >=100,000 COLONIES/mL ESCHERICHIA COLI    Coagulation Studies: Recent Labs    09/28/22 1444  LABPROT 19.8*  INR 1.7*    Urinalysis: No results for input(s): "COLORURINE", "LABSPEC", "PHURINE", "GLUCOSEU", "HGBUR", "BILIRUBINUR", "KETONESUR", "PROTEINUR", "UROBILINOGEN", "NITRITE", "LEUKOCYTESUR" in the last 72 hours.  Invalid input(s): "APPERANCEUR"     Imaging: DG Chest 1 View  Result Date: 09/28/2022 CLINICAL DATA:  Dyspnea, intermittent chest pain EXAM: CHEST  1 VIEW COMPARISON:  09/20/2022 FINDINGS: Single frontal view of the chest demonstrates interval increase in left pleural effusion and left basilar consolidation. Cardiac silhouette is obscured. There is increased central vascular congestion, with patchy ground-glass airspace disease seen within the aerated lungs bilaterally. No pneumothorax. No acute bony abnormality. IMPRESSION: 1. Progressive left pleural effusion and left basilar atelectasis since prior exam. Similar appearance to the 09/08/2022 exam. 2. Mild pulmonary edema. Electronically Signed   By: Jean Johnson M.D.   On: 09/28/2022 13:52     Medications:       amiodarone  200 mg Oral Daily   ascorbic acid  500 mg Oral BID   atorvastatin  10 mg Oral Daily   ezetimibe  10 mg Oral Daily   feeding supplement  237 mL Oral TID BM   furosemide  40 mg Oral Daily   insulin aspart  0-5 Units Subcutaneous QHS   insulin aspart  0-6 Units Subcutaneous TID WC   multivitamin with minerals  1 tablet Oral Daily   mycophenolate  500 mg Oral BID   tacrolimus  1 mg Oral BID    acetaminophen, albuterol, dextromethorphan-guaiFENesin, diphenhydrAMINE, docusate sodium, hydrALAZINE, oxyCODONE-acetaminophen, traZODone  Assessment/ Plan:  Jean Johnson is a 72 y.o.  female  with past medical conditions including diabetes, atrial fibrillation on Eliquis, dyslipidemia, hypertension, end-stage renal disease status post renal transplant x 2, diabetic neuropathy, and PUD.  Patient presents to the emergency department with complaints of shortness of breath and chest pain.  Patient has been admitted for Pleural effusion [J90] Pleural effusion on left [J90] Dyspnea, unspecified type [R06.00]   Acute Kidney Injury on chronic kidney disease stage IIIAt with baseline creatinine 1.27 and GFR of 45 on 08/18/22.  Acute kidney injury secondary to cardiorenal syndrome Chronic kidney disease is secondary to diabetes, hypertension and CHF. No exposure to IV contrast. Remains on mycophenolate and tacrolimus. Will check tacrolimus level in am. Will continue to monitor renal function  with diuresis. Palliative care consulted.   Lab Results  Component Value Date   CREATININE 2.27 (H) 09/29/2022   CREATININE 2.13 (H) 09/28/2022   CREATININE 2.61 (H) 09/24/2022    Intake/Output Summary (Last 24 hours) at 09/29/2022 1419 Last data filed at 09/29/2022 1048 Gross per 24 hour  Intake 140 ml  Output --  Net 140 ml    2. Anemia of chronic kidney disease  Lab Results  Component Value Date   HGB 9.1 (L) 09/29/2022    Hemoglobin within desired range.  3. Secondary Hyperparathyroidism: with outpatient labs: PTH 141, phosphorus 3.2, calcium 9.7 on 08/18/22.   Lab Results  Component Value Date   CALCIUM 8.6 (L) 09/29/2022   CAION 1.25 08/30/2022   PHOS 4.6 09/18/2022   Will continue to monitor bone minerals during this admission.  4. Diabetes mellitus type II with chronic kidney disease/renal manifestations: insulin/noninsulin dependent. Home regimen includes Humalog and levemir.  Most recent hemoglobin A1c is 5.7 on 04/14/22.     5. Acute on Chronic diastolic heart failure. ECHO on 09/08/22 shows EF 60-65% with a grade 1 diastolic dysfunction, mild AVR with stenosis.   Furosemide 40 mg IV ordered once.    7. Hypernatremia, sodium 146.  Will monitor   LOS: 1   3/21/20242:19 PM

## 2022-09-29 NOTE — Progress Notes (Addendum)
Initial Nutrition Assessment  DOCUMENTATION CODES:   Severe malnutrition in context of chronic illness  INTERVENTION:   -Downgrade diet to dysphagia 3 diet for ease of intake -MVI with minerals daily -Ensure Enlive po TID, each supplement provides 350 kcal and 20 grams of protein -Magic cup TID with meals, each supplement provides 290 kcal and 9 grams of protein  -Continue 500 mg vitamin C BID  NUTRITION DIAGNOSIS:   Severe Malnutrition related to chronic illness (ESRD s/p renal transplant x 2) as evidenced by moderate fat depletion, severe fat depletion, moderate muscle depletion, severe muscle depletion.  GOAL:   Patient will meet greater than or equal to 90% of their needs  MONITOR:   PO intake, Supplement acceptance  REASON FOR ASSESSMENT:   Consult Wound healing  ASSESSMENT:   Pt with medical history significant of ESRD s/p renal transplant x 2 now having CKD stage IIIb, hypertension, dyslipidemia, hypothyroidism, PAF on anticoagulation with Eliquis, PUD, type 2 diabetes mellitus with diabetic neuropathy as well as other comorbidities who presented with SOB.  Pt admitted with lt pleural effusion.   Reviewed I/O's: +140 ml x 24 hours   Per MD notes, plan for thoracentesis.   Per [facility] notes, pt with stage III pressure injury to sacrum.   Spoke with pt and family member at bedside. Pt is familiar to this RD due to recent prior admission. Pt reports that she has been making progress in SNF [facility]) since discharge. Per family member, pt was pocketing food during last admission and was placed on a dysphagia 1 diet. Pt has since been upgraded to a regular textured diet, but still needs meats cut up. She has been consuming a lot of soft salad sandwiches. Pt admits that she is a selective eater.   Pt is currently on a heart healthy diet. Noted meal completions 25%. RD offered choice of liberalized diet vs mechanically altered diet for ease of intake. Pt would prefer  mechanically altered diet.   Pt reports wt loss. She reports her UBW is around 155# and estimates a 20# wt loss over the past year. Reviewed wt hx; pt has experienced a 2.8% wt loss over the past 3 months, which is not significant for time frame.   Discussed importance of good meal and supplement intake to promote healing. Pt amenable to Ensure and Magic Cups.   Medications reviewed and include vitamin C, lasix, cellcept, and prograf.   Lab Results  Component Value Date   HGBA1C 5.7 (H) 04/14/2022   PTA DM medications are 8 units insulin detemir daily at bedtime and 5 units insulin lispro BID.   Labs reviewed: CBGS: 60-146 (inpatient orders for glycemic control are 0-5 units insulin aspart daily at bedtime and 0-6 units insulin aspart TID with meals).    NUTRITION - FOCUSED PHYSICAL EXAM:  Flowsheet Row Most Recent Value  Orbital Region Moderate depletion  Upper Arm Region Severe depletion  Thoracic and Lumbar Region Severe depletion  Buccal Region Moderate depletion  Temple Region Mild depletion  Clavicle Bone Region Severe depletion  Clavicle and Acromion Bone Region Severe depletion  Scapular Bone Region Severe depletion  Dorsal Hand Moderate depletion  Patellar Region Severe depletion  Anterior Thigh Region Severe depletion  Posterior Calf Region Severe depletion  Edema (RD Assessment) None  Hair Reviewed  Eyes Reviewed  Mouth Reviewed  Skin Reviewed  Nails Reviewed       Diet Order:   Diet Order  DIET DYS 3 Fluid consistency: Thin  Diet effective now                   EDUCATION NEEDS:   Education needs have been addressed  Skin:  Skin Assessment: Skin Integrity Issues: Skin Integrity Issues:: Stage II Stage II: - Stage III: sacrum  Last BM:  09/28/22  Height:   Ht Readings from Last 1 Encounters:  09/28/22 5\' 2"  (1.575 m)    Weight:   Wt Readings from Last 1 Encounters:  09/28/22 48.5 kg    Ideal Body Weight:  50 kg  BMI:   Body mass index is 19.57 kg/m.  Estimated Nutritional Needs:   Kcal:  1500-1700  Protein:  85-100 grams  Fluid:  > 1.5 L    Loistine Chance, RD, LDN, [place] Registered Dietitian II Certified Diabetes Care and Education Specialist Please refer to [facility] for RD and/or RD on-call/weekend/after hours pager

## 2022-09-29 NOTE — Assessment & Plan Note (Signed)
Heart rate well-controlled. -Continue amiodarone -Cardizem is being held due to borderline blood pressure -Holding Eliquis for thoracentesis

## 2022-09-29 NOTE — TOC Progression Note (Signed)
Transition of Care [facility]) - Progression Note    Patient Details  Name: Jean Johnson MRN: [id number] Date of Birth: 1950-08-06  [facility] of [facility] CM/SW Contact  Laurena Slimmer, RN Phone Number: 09/29/2022, 3:59 PM  Clinical Narrative:    Damaris Schooner with Sharyn Lull in admissions at [facility]. Patient is STR and can return at discharge.         Expected Discharge Plan and Services                                               Social Determinants of Health (SDOH) Interventions SDOH Screenings   Food Insecurity: No Food Insecurity (09/07/2022)  Housing: Low Risk  (09/07/2022)  Transportation Needs: No Transportation Needs (09/07/2022)  Utilities: Not At Risk (09/07/2022)  Tobacco Use: Low Risk  (09/28/2022)    Readmission Risk Interventions    09/08/2022    4:23 PM 04/27/2022    3:34 PM  Readmission Risk Prevention Plan  Transportation Screening Complete Complete  HRI or Home Care Consult  Complete  Social Work Consult for [place] Planning/Counseling  Complete  Palliative Care Screening  Not Applicable  Medication Review Press photographer) Complete Complete  PCP or Specialist appointment within 3-5 days of discharge Complete   HRI or [place] Complete   SW Recovery Care/Counseling Consult Complete   Palliative Care Screening Not Complete   Skilled Nursing Facility Complete

## 2022-09-29 NOTE — Assessment & Plan Note (Signed)
Hemoglobin at 10.6 on admission, may be some hemoconcentration as it is back to baseline around 9 today. -Continue to monitor

## 2022-09-29 NOTE — Assessment & Plan Note (Signed)
:   2D echo on 09/08/2022 showed EF of 60 to 65% with grade 1 diastolic dysfunction.  Patient has elevated BNP 646, but no leg edema or JVD.  Does not seem to have CHF exacerbation. -Patient received 40 mg of Lasix IV in the ED, will not continue IV Lasix. -Continue home Lasix 40 mg daily

## 2022-09-29 NOTE — Assessment & Plan Note (Signed)
Slight increase in creatinine with GFR of 22 today.  She has creatinine of 2.61 on 3/16 when she was discharged to rehab.  Patient with history of renal transplant x 2. -Nephrology was also consulted -Monitor renal function -Avoid nephrotoxins

## 2022-09-29 NOTE — Consult Note (Signed)
[facility] Nurse Consult Note: Reason for Consult: sacral pressure injury Patient from [facility] with SOB, chest pain. Recently DC 09/24/22 Seen by [facility] nursing at that time as well.  Wound type: Stage 3 Pressure Injury; sacrum  Pressure Injury POA: Yes Measurement: 5cm x 3cm x 0.5cm  Wound bed:100% pink Drainage (amount, consistency, odor) scant, serosanguinous  Periwound:  macerated  Dressing procedure/placement/frequency: Saline moist gauze dressing, change daily Top with silicone foam, ok to lift foam and change gauze daily. Change foam every 3 days.    Re consult if needed, will not follow at this time. Thanks  Rider Ermis [employer], RN,CWOCN, CNS, [place] [phone])

## 2022-09-29 NOTE — Progress Notes (Signed)
Progress Note   Patient: Jean Johnson [id number] DOB: [birth year] DOA: 09/28/2022     1 DOS: the patient was seen and examined on 09/29/2022   Brief hospital course: Taken from H&P.  Jean Johnson is a 72 y.o. female with medical history significant of ESRD s/p renal transplant x 2 now having CKD stage IIIb, hypertension, dyslipidemia, hypothyroidism, PAF on anticoagulation with Eliquis, PUD, type 2 diabetes mellitus with diabetic neuropathy as well as other comorbidities who presented to the ED with SOB.   Pt was recently hospitalized from 2/27 - 3/15 due to CHF exacerbation.  Patient is discharged to nursing home for rehab. Patient reports left-sided chest pain, which is sharp, pleuritic, 7 out of 10 in severity, nonradiating.  Patient has nausea, no vomiting, diarrhea or abdominal pain.  No symptoms of UTI.  She took her last dose of Eliquis at 11:33 on 09/27/2022.     Data reviewed independently and ED Course: pt was found to have WBC 11.6, BNP 646, trop  34 --> 25, stable renal function compared to recent baseline creatinine, INR 1.7, temperature normal, soft blood pressure 97/73, heart rate 89, RR 18, oxygen saturation 100% on room air.  Chest x-ray showed large left pleural effusion with mild interstitial edema. EKG: A flutter, QTc 526, RAD, poor R wave progression.   Thoracentesis was ordered by admitting provider.  3/21: Vital stable.  Significant hypoglycemia with recorded CBG of 33 requiring intervention.  Stopping Semglee and SSI decreased to very sensitive.  Some worsening of renal function.  Nephrology was also consulted.  Pending thoracentesis Patient with overall persistent declining health and recurrent admissions.  Family is now leaning more towards comfort care.  Palliative care consult was ordered. She is very high risk for deterioration and mortality based on significant underlying comorbidities.    Assessment and Plan: * Pleural effusion on left Patient  with large left pleural effusion which can be contributory to her shortness of breath.  Persistent leukocytosis but no fever. Thoracentesis was ordered-still pending. -Follow-up fluid analysis  Chronic diastolic CHF (congestive heart failure) ([place]) : 2D echo on 09/08/2022 showed EF of 60 to 65% with grade 1 diastolic dysfunction.  Patient has elevated BNP 646, but no leg edema or JVD.  Does not seem to have CHF exacerbation. -Patient received 40 mg of Lasix IV in the ED, will not continue IV Lasix. -Continue home Lasix 40 mg daily  Myocardial injury  Troponin level 34 --> 25.  Patient reports pleuritic chest pain, which is likely due to left pleural effusion. -Continue Lipitor -Will not give aspirin since patient is on Eliquis  Atrial fibrillation, chronic (HCC) Heart rate well-controlled. -Continue amiodarone -Cardizem is being held due to borderline blood pressure -Holding Eliquis for thoracentesis  Chronic kidney disease, stage 3b (HCC) Slight increase in creatinine with GFR of 22 today.  She has creatinine of 2.61 on 3/16 when she was discharged to rehab.  Patient with history of renal transplant x 2. -Nephrology was also consulted -Monitor renal function -Avoid nephrotoxins  HLD (hyperlipidemia) -Continue Lipitor  Essential hypertension Blood pressure currently on softer side. -Holding home Cardizem -Continue with Lasix  Anemia in CKD (chronic kidney disease) Hemoglobin at 10.6 on admission, may be some hemoconcentration as it is back to baseline around 9 today. -Continue to monitor  Kidney transplant status -Continue home Prograf and CellCept   Type II diabetes mellitus with renal manifestations (HCC) Recent A1c of 5.7.  It was listed that patient is  taking Levemir 8 units daily along with Humalog.  She has developed hypoglycemia this morning which was initially resistant to p.o. intervention requiring an amp of D50. Patient also has very poor p.o. intake and failure  to thrive picture. -Stop Semglee -Very sensitive SSI  Stage IV pressure ulcer of sacral region ([place]) Also started having some pressure injury to left shoulder as patient mostly lean towards left. -Wound care was consulted  Protein-calorie malnutrition, severe Patient also has failure to thrive picture. -Continue with dietary supplements -Palliative care consult        Subjective: Patient was seen and examined today.  Appears lethargic but denies any significant pain.  Physical Exam: Vitals:   09/28/22 2357 09/29/22 0434 09/29/22 0900 09/29/22 1148  BP: (!) 93/55 (!) 112/55 (!) 118/56 (!) 93/48  Pulse: 89 72 60 63  Resp: 16 16 16 16   Temp: (!) 97.5 F (36.4 C) 97.6 F (36.4 C) 98 F (36.7 C) 98 F (36.7 C)  TempSrc:   Oral Oral  SpO2:  100%    Weight:      Height:       General.  Frail and severely malnourished elderly lady, in no acute distress. Pulmonary.  Lungs clear bilaterally, normal respiratory effort.  Decreased breath sound at bases. CV.  Regular rate and rhythm, no JVD, rub or murmur. Abdomen.  Soft, nontender, nondistended, BS positive. CNS.  Alert and oriented .  No focal neurologic deficit. Extremities.  1+ LE edema, no cyanosis, pulses intact and symmetrical. Psychiatry.  Judgment and insight appears normal.   Data Reviewed: Prior data reviewed  Family Communication: Discussed with husband and sister-in-law at bedside.  Family now thinking about proceeding with comfort care only, no definitive decision yet  Disposition: Status is: Inpatient Remains inpatient appropriate because: Severity of illness  Planned Discharge Destination: SNF versus hospice  Time spent: 50 minutes  This record has been created using Systems analyst. Errors have been sought and corrected,but may not always be located. Such creation errors do not reflect on the standard of care.   Author: Lorella Nimrod, MD 09/29/2022 2:42 PM  For on call review  [email or web address].

## 2022-09-29 NOTE — Assessment & Plan Note (Signed)
-  Continue home Prograf and CellCept

## 2022-09-29 NOTE — Assessment & Plan Note (Signed)
Blood pressure currently on softer side. -Holding home Cardizem -Continue with Lasix

## 2022-09-29 NOTE — Procedures (Signed)
PROCEDURE SUMMARY:  Successful US guided Left thoracentesis. Yielded 350 mL of clear pale colored fluid. Pt tolerated procedure well. No immediate complications.  Specimen was sent for labs. CXR ordered.  EBL < 1 mL  Rockney Ghee 09/29/2022 2:44 PM

## 2022-09-29 NOTE — Assessment & Plan Note (Signed)
Recent A1c of 5.7.  It was listed that patient is taking Levemir 8 units daily along with Humalog.  She has developed hypoglycemia this morning which was initially resistant to p.o. intervention requiring an amp of D50. Patient also has very poor p.o. intake and failure to thrive picture. -Stop Semglee -Very sensitive SSI

## 2022-09-29 NOTE — Progress Notes (Signed)
Inpatient Diabetes Program Recommendations  AACE/ADA: New Consensus Statement on Inpatient Glycemic Control (2015)  Target Ranges:  Prepandial:   less than 140 mg/dL      Peak postprandial:   less than 180 mg/dL (1-2 hours)      Critically ill patients:  140 - 180 mg/dL   Lab Results  Component Value Date   GLUCAP 61 (L) 09/29/2022   HGBA1C 5.7 (H) 04/14/2022    Review of Glycemic Control  Latest Reference Range & Units 09/28/22 22:08 09/29/22 08:26 09/29/22 08:30  Glucose-Capillary 70 - 99 mg/dL 97 33 (LL) 61 (L)   Diabetes history: DM 2 Outpatient Diabetes medications:  Levemir 8 units q HS Humalog 5 units bid Current orders for Inpatient glycemic control:  Semglee 4 units q HS Novolog sensitive tid with meals and HS  Inpatient Diabetes Program Recommendations:    Note low blood sugar this AM.  Recommend holding Semglee for now and reduce Novolog correction to "very sensitive" 0-6 units tid with meals.    Thanks,  Adah Perl, RN, BC-ADM Inpatient Diabetes Coordinator Pager [phone]  (8a-5p)

## 2022-09-29 NOTE — Hospital Course (Addendum)
Taken from H&P.  Jean Johnson is a 72 y.o. female with medical history significant of ESRD s/p renal transplant x 2 now having CKD stage IIIb, hypertension, dyslipidemia, hypothyroidism, PAF on anticoagulation with Eliquis, PUD, type 2 diabetes mellitus with diabetic neuropathy as well as other comorbidities who presented to the ED with SOB.   Pt was recently hospitalized from 2/27 - 3/15 due to CHF exacerbation.  Patient is discharged to nursing home for rehab. Patient reports left-sided chest pain, which is sharp, pleuritic, 7 out of 10 in severity, nonradiating.  Patient has nausea, no vomiting, diarrhea or abdominal pain.  No symptoms of UTI.  She took her last dose of Eliquis at 11:33 on 09/27/2022.     Data reviewed independently and ED Course: pt was found to have WBC 11.6, BNP 646, trop  34 --> 25, stable renal function compared to recent baseline creatinine, INR 1.7, temperature normal, soft blood pressure 97/73, heart rate 89, RR 18, oxygen saturation 100% on room air.  Chest x-ray showed large left pleural effusion with mild interstitial edema. EKG: A flutter, QTc 526, RAD, poor R wave progression.   Thoracentesis was ordered by admitting provider.  3/21: Vital stable.  Significant hypoglycemia with recorded CBG of 33 requiring intervention.  Stopping Semglee and SSI decreased to very sensitive.  Some worsening of renal function.  Nephrology was also consulted.  Pending thoracentesis Patient with overall persistent declining health and recurrent admissions.  Family is now leaning more towards comfort care.  Palliative care consult was ordered. She is very high risk for deterioration and mortality based on significant underlying comorbidities.  3/22: Vital stable.  S/p thoracentesis yesterday with removal of 350 cc fluid.  Preliminary labs appears transudative and preliminary cultures negative so far.  Urine cultures with pansensitive E. Coli. Palliative care meeting today around  noon, family decided to proceed with comfort care only.  Most likely be going to hospice facility, family has not make any final decision yet. P.o. intake remains very poor and patient appears very lethargic.  3/23: Patient seems stable with very poor p.o. intake.  Discussed with family and now they agree to consider hospice facility.  Apparently they do not have any experience with this situation.  Hospice to discuss with family and if remains stable then proceed with transfer, depending bed availability.  Patient now had a bed at hospice facility, where she is being discharged for EOL care.

## 2022-09-29 NOTE — Assessment & Plan Note (Signed)
Troponin level 34 --> 25.  Patient reports pleuritic chest pain, which is likely due to left pleural effusion. -Continue Lipitor -Will not give aspirin since patient is on Eliquis

## 2022-09-29 NOTE — Plan of Care (Signed)
GOC consult noted. Patient is off unit. Husband is at bedside. Patient familiar to our team and completed AD last admission. Plans for meeting tomorrow at 12:00 with Sunday Spillers and husband at bedside.

## 2022-09-29 NOTE — Assessment & Plan Note (Signed)
Patient also has failure to thrive picture. -Continue with dietary supplements -Palliative care consult

## 2022-09-29 NOTE — Assessment & Plan Note (Signed)
-  Continue Lipitor °

## 2022-09-29 NOTE — Assessment & Plan Note (Signed)
Also started having some pressure injury to left shoulder as patient mostly lean towards left. -Wound care was consulted

## 2022-09-30 DIAGNOSIS — Z7189 Other specified counseling: Secondary | ICD-10-CM | POA: Diagnosis not present

## 2022-09-30 DIAGNOSIS — J90 Pleural effusion, not elsewhere classified: Secondary | ICD-10-CM | POA: Diagnosis not present

## 2022-09-30 DIAGNOSIS — I482 Chronic atrial fibrillation, unspecified: Secondary | ICD-10-CM | POA: Diagnosis not present

## 2022-09-30 DIAGNOSIS — N1832 Chronic kidney disease, stage 3b: Secondary | ICD-10-CM | POA: Diagnosis not present

## 2022-09-30 DIAGNOSIS — I5032 Chronic diastolic (congestive) heart failure: Secondary | ICD-10-CM | POA: Diagnosis not present

## 2022-09-30 LAB — GLUCOSE, CAPILLARY
Glucose-Capillary: 85 mg/dL (ref 70–99)
Glucose-Capillary: 80 mg/dL (ref 70–99)

## 2022-09-30 MED ORDER — HALOPERIDOL LACTATE 5 MG/ML IJ SOLN: 0.5000 mg | INTRAMUSCULAR | Status: DC | PRN

## 2022-09-30 MED ORDER — HYDROMORPHONE HCL 1 MG/ML IJ SOLN
0.5000 mg | INTRAMUSCULAR | Status: DC | PRN
  Administered 2022-09-30 – 2022-10-01 (×6): 0.5 mg via INTRAVENOUS
  Filled 2022-09-30 (×6): qty 1

## 2022-09-30 MED ORDER — ONDANSETRON HCL 4 MG/2ML IJ SOLN: 4.0000 mg | Freq: Four times a day (QID) | INTRAMUSCULAR | Status: DC | PRN

## 2022-09-30 MED ORDER — LORAZEPAM 2 MG/ML PO CONC: 1.0000 mg | ORAL | Status: DC | PRN

## 2022-09-30 MED ORDER — POLYVINYL ALCOHOL 1.4 % OP SOLN: 1.0000 [drp] | Freq: Four times a day (QID) | OPHTHALMIC | Status: DC | PRN

## 2022-09-30 MED ORDER — BIOTENE DRY MOUTH MT LIQD: 15.0000 mL | OROMUCOSAL | Status: DC | PRN

## 2022-09-30 MED ORDER — SODIUM CHLORIDE 0.9% FLUSH: 3.0000 mL | INTRAVENOUS | Status: DC | PRN

## 2022-09-30 MED ORDER — HALOPERIDOL LACTATE 2 MG/ML PO CONC: 0.5000 mg | ORAL | Status: DC | PRN

## 2022-09-30 MED ORDER — SODIUM CHLORIDE 0.9% FLUSH
3.0000 mL | Freq: Two times a day (BID) | INTRAVENOUS | Status: DC
  Administered 2022-09-30 – 2022-10-01 (×3): 3 mL via INTRAVENOUS

## 2022-09-30 MED ORDER — GLYCOPYRROLATE 0.2 MG/ML IJ SOLN: 0.2000 mg | INTRAMUSCULAR | Status: DC | PRN

## 2022-09-30 MED ORDER — ONDANSETRON 4 MG PO TBDP
4.0000 mg | ORAL_TABLET | Freq: Four times a day (QID) | ORAL | Status: DC | PRN
  Administered 2022-10-01: 4 mg via ORAL
  Filled 2022-09-30: qty 1

## 2022-09-30 MED ORDER — HALOPERIDOL 0.5 MG PO TABS: 0.5000 mg | ORAL_TABLET | ORAL | Status: DC | PRN

## 2022-09-30 MED ORDER — LORAZEPAM 2 MG/ML IJ SOLN: 1.0000 mg | INTRAMUSCULAR | Status: DC | PRN

## 2022-09-30 MED ORDER — GLYCOPYRROLATE 1 MG PO TABS: 1.0000 mg | ORAL_TABLET | ORAL | Status: DC | PRN

## 2022-09-30 MED ORDER — OXYCODONE HCL 5 MG PO TABS: 5.0000 mg | ORAL_TABLET | ORAL | Status: DC | PRN

## 2022-09-30 MED ORDER — SODIUM CHLORIDE 0.9 % IV SOLN: 250.0000 mL | INTRAVENOUS | Status: DC | PRN

## 2022-09-30 MED ORDER — LORAZEPAM 1 MG PO TABS: 1.0000 mg | ORAL_TABLET | ORAL | Status: DC | PRN

## 2022-09-30 NOTE — Assessment & Plan Note (Signed)
S/p thoracentesis on 3/21.  No growth on cultures so far Patient presented with large left pleural effusion which can be contributory to her shortness of breath.  Persistent leukocytosis but no fever. -Patient is not transition to comfort care

## 2022-09-30 NOTE — Progress Notes (Signed)
       CROSS COVER NOTE  NAME: Jean Johnson MRN: [id number] DOB : Jul 14, 1950    HPI/Events of Note   Report:askef to transfer patient from progressive unit due to transition of care to comfort  On review of chart:Comfort care measures ordered    Assessment and  Interventions   Assessment:  Plan: Transfer to [facility]

## 2022-09-30 NOTE — TOC Progression Note (Addendum)
Transition of Care [facility]) - Progression Note    Patient Details  Name: Jean Johnson MRN: [id number] Date of Birth: 05/23/1951  Transition of Care [facility]) CM/SW Contact  Laurena Slimmer, RN Phone Number: 09/30/2022, 12:41 PM  Clinical Narrative:    Patient is on comfort care.  Spoke with patients HCPOA regarding hospice.  She stated she was overwhelmed and could not make a choice at this time. She would like for the patient to remain her while she speak with patient's spouse about transferring to hospice.   Spoke with Donne Anon, and patient's spouse at the bedside. They do not wish for patient to be transferred to hospice. MD notified.          Expected Discharge Plan and Services                                               Social Determinants of Health (SDOH) Interventions SDOH Screenings   Food Insecurity: No Food Insecurity (09/07/2022)  Housing: Low Risk  (09/07/2022)  Transportation Needs: No Transportation Needs (09/07/2022)  Utilities: Not At Risk (09/07/2022)  Tobacco Use: Low Risk  (09/28/2022)    Readmission Risk Interventions    09/08/2022    4:23 PM 04/27/2022    3:34 PM  Readmission Risk Prevention Plan  Transportation Screening Complete Complete  HRI or Home Care Consult  Complete  Social Work Consult for [place] Planning/Counseling  Complete  Palliative Care Screening  Not Applicable  Medication Review Press photographer) Complete Complete  PCP or Specialist appointment within 3-5 days of discharge Complete   HRI or [place] Complete   SW Recovery Care/Counseling Consult Complete   Palliative Care Screening Not Complete   Skilled Nursing Facility Complete

## 2022-09-30 NOTE — Assessment & Plan Note (Signed)
:   2D echo on 09/08/2022 showed EF of 60 to 65% with grade 1 diastolic dysfunction.  Patient has elevated BNP 646, but no leg edema or JVD.  Does not seem to have CHF exacerbation. -Patient received 40 mg of Lasix IV in the ED, will not continue IV Lasix. -Continue home Lasix 40 mg daily

## 2022-09-30 NOTE — Assessment & Plan Note (Signed)
Patient also has failure to thrive picture. -Continue with dietary supplements -Palliative care consult-being transition to comfort care

## 2022-09-30 NOTE — Consult Note (Cosign Needed Addendum)
Consultation Note Date: 09/30/2022   Patient Name: Jean Johnson  DOB: 1951/01/19  MRN: [id number]  Age / Sex: 72 y.o., female  PCP: Gladstone Lighter, MD Referring Physician: Lorella Nimrod, MD  Reason for Consultation: Establishing goals of care  HPI/Patient Profile: Jean Johnson is a 72 y.o. female with medical history significant of ESRD s/p renal transplant x 2 now having CKD stage IIIb, hypertension, dyslipidemia, hypothyroidism, PAF on anticoagulation with Eliquis, PUD, type 2 diabetes mellitus with diabetic neuropathy as well as other comorbidities who presented to the ED with SOB.   Clinical Assessment and Goals of Care: Notes and labs reviewed. In to see patient. Patient is resting in bed. She complains of pain.  H POA Sunday Spillers, and husband who is secondary H POA are at bedside.  They discussed that since last discharge she has had shortness of breath, and has continued to decline. They discussed that her pain has increased.  They discussed that her p.o. intake has declined.   We discussed her diagnosis, prognosis, GOC, EOL wishes disposition and options.  Created space and opportunity for patient  to explore thoughts and feelings regarding current medical information.   A detailed discussion was had today regarding advanced directives.  Concepts specific to code status, artifical feeding and hydration, IV antibiotics and rehospitalization were discussed.  The difference between an aggressive medical intervention path and a comfort care path was discussed.  Values and goals of care important to patient and family were attempted to be elicited.  Discussed limitations of medical interventions to prolong quality of life in some situations and discussed the concept of human mortality.  Patient states that she is tired, and does not want to suffer any longer.  Indicates she does not want any  further life-prolonging care.  She states she would like to shift to comfort focused care and is amenable to hospice.  I completed a MOST form today and was signed by primary H POA Sunday Spillers as patient is unable, the signed original was placed in the chart. The form was scanned and sent to medical records for it to be uploaded under ACP tab in Epic. A photocopy was also placed in the chart to be scanned into EMR. The patient outlined their wishes for the following treatment decisions:  Cardiopulmonary Resuscitation: Do Not Attempt Resuscitation (DNR/No CPR)  Medical Interventions: Comfort Measures: Keep clean, warm, and dry. Use medication by any route, positioning, wound care, and other measures to relieve pain and suffering. Use oxygen, suction and manual treatment of airway obstruction as needed for comfort. Do not transfer to the hospital unless comfort needs cannot be met in current location.  Antibiotics: No antibiotics (use other measures to relieve symptoms)  IV Fluids: No IV fluids (provide other measures to ensure comfort)  Feeding Tube: No feeding tube       SUMMARY OF RECOMMENDATIONS   Patient would like to transition to comfort focused care.  Recommend hospice facility placement if able, and if stable when bed is available.  Comfort orders in place.  Epic chat sent to attending and TOC.  Nursing aware of plans.   Prognosis:  < 2 weeks     Primary Diagnoses: Present on Admission:  Pleural effusion on left  Atrial fibrillation, chronic (HCC)  Essential hypertension  Myocardial injury  Protein-calorie malnutrition, severe  Chronic diastolic CHF (congestive heart failure) (HCC)  HLD (hyperlipidemia)  Chronic kidney disease, stage 3b (HCC)  Type II diabetes mellitus with renal manifestations ([place])  Anemia in CKD (chronic kidney disease)  Stage IV pressure ulcer of sacral region [facility])   I have reviewed the medical record, interviewed the patient and family, and examined the  patient. The following aspects are pertinent.  Past Medical History:  Diagnosis Date   Anemia of chronic renal disease    Aortic stenosis, mild    a.) TTE on 11/23/2020 --> mean gradient 9.7 mmHg   Atrial fibrillation (HCC)    a.) CHA2DS2VASc = 4 (age, sex, HTN, T2DM);  b.) rate/rhythm maintained on oral diltiazem + flecainide; chronically anticoagulated with apixaban   B12 deficiency    Cervical spinal stenosis    Diabetic neuropathy (HCC)    Diverticulosis    Dyspnea    ESRD (end stage renal disease) (HCC)    First degree AV block    Gastrointestinal ulcer    HLD (hyperlipidemia)    Hypertension    Hypothyroidism    Incomplete right bundle branch block (RBBB)    LAE (left atrial enlargement)    a.) TTE on 11/23/2020 --> moderate   Left thyroid nodule    a.) cervical MRI on 12/29/2020 --> measured "at least" 3.5 cm; incompletely imaged.   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   Valvular regurgitation    a.) TTE 11/23/2020 --> mild panvalvular regurgitation; b.) TTE 04/29/2022: mild MR   Social History   Socioeconomic History   Marital status: Married    Spouse name: Dance movement psychotherapist   Number of children: Not on file   Years of education: Not on file   Highest education level: Not on file  Occupational History   Not on file  Tobacco Use   Smoking status: Never   Smokeless tobacco: Never  Vaping Use   Vaping Use: Never used  Substance and Sexual Activity    Alcohol use: Not Currently   Drug use: Never   Sexual activity: Not Currently  Other Topics Concern   Not on file  Social History Narrative   Not on file   Social Determinants of Health   Financial Resource Strain: Not on file  Food Insecurity: No Food Insecurity (09/07/2022)   Hunger Vital Sign    Worried About Running Out of Food in the Last Year: Never true    Ran Out of Food in the Last Year: Never true  Transportation Needs: No Transportation Needs (09/07/2022)   PRAPARE - Hydrologist (Medical): No    Lack of Transportation (Non-Medical): No  Physical Activity: Not on file  Stress: Not on file  Social Connections: Not on file   Family History  Problem Relation Age of Onset   Diabetes Father    Heart disease Father    Scheduled Meds:  amiodarone  200 mg Oral Daily   feeding supplement  237 mL Oral TID BM   furosemide  40 mg Oral Daily   mycophenolate  500 mg Oral BID   tacrolimus  1 mg Oral BID   Continuous Infusions: PRN Meds:.acetaminophen, albuterol, dextromethorphan-guaiFENesin, diphenhydrAMINE, docusate sodium, hydrALAZINE, oxyCODONE-acetaminophen, traZODone Medications Prior to Admission:  Prior to Admission medications   Medication Sig Start Date End Date Taking? Authorizing Provider  amiodarone (PACERONE) 200 MG tablet Take 1 tablet (200 mg total) by mouth daily. 09/27/22  Yes Verline Lema, MD  apixaban (ELIQUIS) 2.5 MG TABS tablet Take 1 tablet (2.5 mg total) by mouth 2 (two) times daily. 09/23/22  Yes Verline Lema, MD  ascorbic acid (VITAMIN C) 500 MG tablet Take 1 tablet (500 mg total) by mouth 2 (two) times daily. 09/23/22  Yes Verline Lema, MD  atorvastatin (LIPITOR) 10 MG tablet Take 10 mg by mouth daily.   Yes [provider]  diltiazem (CARDIZEM) 60 MG tablet Take 1 tablet (60 mg total) by mouth every 12 (twelve) hours. 09/23/22  Yes Verline Lema, MD  docusate sodium (COLACE) 100 MG capsule Take 1 capsule (100 mg  total) by mouth 2 (two) times daily. 09/23/22  Yes Verline Lema, MD  ezetimibe (ZETIA) 10 MG tablet Take 10 mg by mouth daily.   Yes [provider]  furosemide (LASIX) 40 MG tablet Take 1 tablet (40 mg total) by mouth daily. 09/24/22  Yes Verline Lema, MD  insulin detemir (LEVEMIR) 100 UNIT/ML injection Inject 8 Units into the skin at bedtime.   Yes [provider]  insulin lispro (HUMALOG) 100 UNIT/ML injection Inject 5 Units into the skin 2 (two) times daily.   Yes [provider]  mycophenolate (CELLCEPT) 500 MG tablet Take 500 mg by mouth 2 (two) times daily.   Yes [provider]  ondansetron (ZOFRAN-ODT) 4 MG disintegrating tablet Take 4 mg by mouth every 8 (eight) hours as needed. 09/25/22  Yes [provider]  pregabalin (LYRICA) 50 MG capsule Take 1 capsule (50 mg total) by mouth daily. 09/23/22  Yes Verline Lema, MD  tacrolimus (PROGRAF) 1 MG capsule Take 1 capsule (1 mg total) by mouth 2 (two) times daily. 09/23/22  Yes Verline Lema, MD  traMADol (ULTRAM) 50 MG tablet Take 25 mg by mouth 3 (three) times daily as needed.   Yes [provider]  traZODone (DESYREL) 50 MG tablet Take 0.5 tablets (25 mg total) by mouth at bedtime as needed for sleep. 05/01/22  Yes Emeterio Reeve, DO  acetaminophen (TYLENOL) 325 MG tablet Take 2 tablets (650 mg total) by mouth every 6 (six) hours as needed for mild pain (or Fever >/= 101). 09/23/22   Verline Lema, MD  amiodarone (PACERONE) 200 MG tablet Take 1 tablet (200 mg total) by mouth 2 (two) times daily. Patient not taking: Reported on 09/28/2022 09/23/22   Verline Lema, MD  diltiazem (CARDIZEM CD) 240 MG 24 hr capsule Take 240 mg by mouth daily. Patient not taking: Reported on 09/28/2022 09/26/22   [provider]  HUMALOG MIX 75/25 KWIKPEN (75-25) 100 UNIT/ML KwikPen Inject into the skin. Patient not taking: Reported on 09/28/2022 09/25/22  [provider]  ondansetron  (ZOFRAN) 4 MG tablet Take 1 tablet (4 mg total) by mouth every 6 (six) hours as needed for nausea. Patient not taking: Reported on 09/28/2022 09/23/22   Verline Lema, MD   Allergies  Allergen Reactions   E-Mycin [Erythromycin] Swelling    And itching    Penicillins Swelling    Throat Tolerated 1st generation cephalosporin (CEFAZOLIN) on 03/24/2021 without documented ADRs.  No reaction with Ancef on 08/30/2022   Review of Systems  Skin:        Pain in sacral wound.    Physical Exam Pulmonary:     Effort: Pulmonary effort is normal.  Skin:    General: Skin is warm and dry.  Neurological:     Mental Status: She is alert.     Vital Signs: BP 92/64 (BP Location: Right Arm)   Pulse 83   Temp 100.3 F (37.9 C) (Oral)   Resp 18   Ht 5\' 2"  (1.575 m)   Wt 48.5 kg   SpO2 (!) 70%   BMI 19.57 kg/m  Pain Scale: 0-10   Pain Score: 0-No pain   SpO2: SpO2: (!) 70 % O2 Device:SpO2: (!) 70 % O2 Flow Rate: .O2 Flow Rate (L/min): 2 L/min  IO: Intake/output summary:  Intake/Output Summary (Last 24 hours) at 09/30/2022 1238 Last data filed at 09/30/2022 1153 Gross per 24 hour  Intake 580 ml  Output 175 ml  Net 405 ml    LBM: Last BM Date : 09/28/22 Baseline Weight: Weight: 48.5 kg Most recent weight: Weight: 48.5 kg      Signed by: Asencion Gowda, NP   Please contact Palliative Medicine Team phone at [phone] for questions and concerns.  For individual provider: See Shea Evans

## 2022-09-30 NOTE — Progress Notes (Signed)
Progress Note   Patient: Jean Johnson [id number] DOB: [birth year] DOA: 09/28/2022     2 DOS: the patient was seen and examined on 09/30/2022   Brief hospital course: Taken from H&P.  Annabel Rockman is a 72 y.o. female with medical history significant of ESRD s/p renal transplant x 2 now having CKD stage IIIb, hypertension, dyslipidemia, hypothyroidism, PAF on anticoagulation with Eliquis, PUD, type 2 diabetes mellitus with diabetic neuropathy as well as other comorbidities who presented to the ED with SOB.   Pt was recently hospitalized from 2/27 - 3/15 due to CHF exacerbation.  Patient is discharged to nursing home for rehab. Patient reports left-sided chest pain, which is sharp, pleuritic, 7 out of 10 in severity, nonradiating.  Patient has nausea, no vomiting, diarrhea or abdominal pain.  No symptoms of UTI.  She took her last dose of Eliquis at 11:33 on 09/27/2022.     Data reviewed independently and ED Course: pt was found to have WBC 11.6, BNP 646, trop  34 --> 25, stable renal function compared to recent baseline creatinine, INR 1.7, temperature normal, soft blood pressure 97/73, heart rate 89, RR 18, oxygen saturation 100% on room air.  Chest x-ray showed large left pleural effusion with mild interstitial edema. EKG: A flutter, QTc 526, RAD, poor R wave progression.   Thoracentesis was ordered by admitting provider.  3/21: Vital stable.  Significant hypoglycemia with recorded CBG of 33 requiring intervention.  Stopping Semglee and SSI decreased to very sensitive.  Some worsening of renal function.  Nephrology was also consulted.  Pending thoracentesis Patient with overall persistent declining health and recurrent admissions.  Family is now leaning more towards comfort care.  Palliative care consult was ordered. She is very high risk for deterioration and mortality based on significant underlying comorbidities.  3/22: Vital stable.  S/p thoracentesis yesterday with removal  of 350 cc fluid.  Preliminary labs appears transudative and preliminary cultures negative so far.  Urine cultures with pansensitive E. Coli. Palliative care meeting today around noon, family decided to proceed with comfort care only.  Most likely be going to hospice facility, family has not make any final decision yet. P.o. intake remains very poor and patient appears very lethargic.   Assessment and Plan: * Pleural effusion on left S/p thoracentesis on 3/21.  No growth on cultures so far Patient presented with large left pleural effusion which can be contributory to her shortness of breath.  Persistent leukocytosis but no fever. -Patient is not transition to comfort care  Chronic diastolic CHF (congestive heart failure) ([place]) : 2D echo on 09/08/2022 showed EF of 60 to 65% with grade 1 diastolic dysfunction.  Patient has elevated BNP 646, but no leg edema or JVD.  Does not seem to have CHF exacerbation. -Patient received 40 mg of Lasix IV in the ED, will not continue IV Lasix. -Continue home Lasix 40 mg daily  Myocardial injury  Troponin level 34 --> 25.  Patient reports pleuritic chest pain, which is likely due to left pleural effusion. -Continue Lipitor -Will not give aspirin since patient is on Eliquis  Atrial fibrillation, chronic (HCC) Heart rate well-controlled. -Continue amiodarone -Cardizem is being held due to borderline blood pressure -Holding Eliquis for thoracentesis  Chronic kidney disease, stage 3b (HCC) Slight increase in creatinine with GFR of 22 today.  She has creatinine of 2.61 on 3/16 when she was discharged to rehab.  Patient with history of renal transplant x 2. -Nephrology was also consulted -  Monitor renal function -Avoid nephrotoxins  HLD (hyperlipidemia) -Continue Lipitor  Essential hypertension Blood pressure currently on softer side. -Holding home Cardizem -Continue with Lasix  Anemia in CKD (chronic kidney disease) Hemoglobin at 10.6 on admission,  may be some hemoconcentration as it is back to baseline around 9 today. -Continue to monitor  Kidney transplant status -Continue home Prograf and CellCept   Type II diabetes mellitus with renal manifestations (HCC) Recent A1c of 5.7.  It was listed that patient is taking Levemir 8 units daily along with Humalog.  She has developed hypoglycemia this morning which was initially resistant to p.o. intervention requiring an amp of D50. Patient also has very poor p.o. intake and failure to thrive picture. -Stop Semglee -Very sensitive SSI  Stage IV pressure ulcer of sacral region ([place]) Also started having some pressure injury to left shoulder as patient mostly lean towards left. -Wound care was consulted  Protein-calorie malnutrition, severe Patient also has failure to thrive picture. -Continue with dietary supplements -Palliative care consult-being transition to comfort care        Subjective: Patient appears comfortable.  P.o. intake remains very poor.  Physical Exam: Vitals:   09/30/22 0814 09/30/22 0822 09/30/22 0822 09/30/22 1205  BP: 95/61 116/75 116/75 92/64  Pulse: 82 99 98 83  Resp: 16 16 16 18   Temp: 98.3 F (36.8 C) 98.3 F (36.8 C) 98.3 F (36.8 C) 100.3 F (37.9 C)  TempSrc: Oral Oral Oral Oral  SpO2:  100% 100% (!) 70%  Weight:      Height:       General.  Frail and cachectic lady, in no acute distress. Pulmonary.  Lungs clear bilaterally, normal respiratory effort. CV.  Regular rate and rhythm, no JVD, rub or murmur. Abdomen.  Soft, nontender, nondistended, BS positive. CNS.  Alert and oriented .  No focal neurologic deficit. Extremities.  No edema, no cyanosis, pulses intact and symmetrical.   Data Reviewed: Prior data reviewed  Family Communication: Discussed with husband and sister-in-law at bedside.   Disposition: Status is: Inpatient Remains inpatient appropriate because: Severity of illness  Planned Discharge Destination: SNF versus  hospice  Time spent: 45 minutes  This record has been created using Systems analyst. Errors have been sought and corrected,but may not always be located. Such creation errors do not reflect on the standard of care.   Author: Lorella Nimrod, MD 09/30/2022 3:37 PM  For on call review [email or web address].

## 2022-10-01 DIAGNOSIS — I5032 Chronic diastolic (congestive) heart failure: Secondary | ICD-10-CM | POA: Diagnosis not present

## 2022-10-01 DIAGNOSIS — R0600 Dyspnea, unspecified: Secondary | ICD-10-CM

## 2022-10-01 DIAGNOSIS — J90 Pleural effusion, not elsewhere classified: Secondary | ICD-10-CM | POA: Diagnosis not present

## 2022-10-01 DIAGNOSIS — I482 Chronic atrial fibrillation, unspecified: Secondary | ICD-10-CM | POA: Diagnosis not present

## 2022-10-01 NOTE — Progress Notes (Signed)
Family met with Doreatha Lew, [facility] Liaison. Agree and wish to proceed with [facility] in [place]. Appropriate admission.  Visited with [name] and family at bedside. Offered emotional support. No acute palliative needs at this time. Symptoms well managed on current regimen.  No Charge.  Theodoro Grist, DNP, AGNP-C Palliative Medicine  Please call Palliative Medicine team phone with any questions [phone]. For individual providers please see [facility]ION.

## 2022-10-01 NOTE — Progress Notes (Signed)
Received request from Raina Mina, SW [facility], for  hospice services at [facility]). Spoke with Delfino Lovett, patient's husband and Sunday Spillers his wife to initiate education related to hospice philosophy, services, and team approach to care. Patient/family verbalized understanding of information given.   Per Dr. Laqueta Jean, hospice physician, patient has been approved for admission to [facility].  Bed offer made and accepted by patient's husband.  He would like Sunday Spillers to sign consents in person at the hospice home.  Sunday Spillers will plan on taking a tour of [place].  Consents to be signed with [employer], SW at [facility] at [phone] today.  This RN will arrange EMS transport for 1930.  [place] RN to call report to [facility] at [phone].  Please send signed and completed DNR home with patient/family if applicable.   [facility] information and contact numbers given patient's husband  Continued collaboration with patient/family and [facility] staff is ongoing through final disposition.  Please call with any hospice related questions or concerns. Thank you for the opportunity to participate in this patient's care.  Dimas Aguas, RN Nurse Liaison [phone]

## 2022-10-01 NOTE — Progress Notes (Signed)
Progress Note   Patient: Jean Johnson [id number] DOB: 06-27-1951 DOA: 09/28/2022     3 DOS: the patient was seen and examined on 10/01/2022   Brief hospital course: Taken from H&P.  Jean Johnson is a 73 y.o. female with medical history significant of ESRD s/p renal transplant x 2 now having CKD stage IIIb, hypertension, dyslipidemia, hypothyroidism, PAF on anticoagulation with Eliquis, PUD, type 2 diabetes mellitus with diabetic neuropathy as well as other comorbidities who presented to the ED with SOB.   Pt was recently hospitalized from 2/27 - 3/15 due to CHF exacerbation.  Patient is discharged to nursing home for rehab. Patient reports left-sided chest pain, which is sharp, pleuritic, 7 out of 10 in severity, nonradiating.  Patient has nausea, no vomiting, diarrhea or abdominal pain.  No symptoms of UTI.  She took her last dose of Eliquis at 11:33 on 09/27/2022.     Data reviewed independently and ED Course: pt was found to have WBC 11.6, BNP 646, trop  34 --> 25, stable renal function compared to recent baseline creatinine, INR 1.7, temperature normal, soft blood pressure 97/73, heart rate 89, RR 18, oxygen saturation 100% on room air.  Chest x-ray showed large left pleural effusion with mild interstitial edema. EKG: A flutter, QTc 526, RAD, poor R wave progression.   Thoracentesis was ordered by admitting provider.  3/21: Vital stable.  Significant hypoglycemia with recorded CBG of 33 requiring intervention.  Stopping Semglee and SSI decreased to very sensitive.  Some worsening of renal function.  Nephrology was also consulted.  Pending thoracentesis Patient with overall persistent declining health and recurrent admissions.  Family is now leaning more towards comfort care.  Palliative care consult was ordered. She is very high risk for deterioration and mortality based on significant underlying comorbidities.  3/22: Vital stable.  S/p thoracentesis yesterday with removal  of 350 cc fluid.  Preliminary labs appears transudative and preliminary cultures negative so far.  Urine cultures with pansensitive E. Coli. Palliative care meeting today around noon, family decided to proceed with comfort care only.  Most likely be going to hospice facility, family has not make any final decision yet. P.o. intake remains very poor and patient appears very lethargic.  3/23: Patient seems stable with very poor p.o. intake.  Discussed with family and now they agree to consider hospice facility.  Apparently they do not have any experience with this situation.  Hospice to discuss with family and if remains stable then proceed with transfer, depending bed availability.   Assessment and Plan: * Pleural effusion on left S/p thoracentesis on 3/21.  No growth on cultures so far Patient presented with large left pleural effusion which can be contributory to her shortness of breath.  Persistent leukocytosis but no fever. -Patient is not transition to comfort care  Chronic diastolic CHF (congestive heart failure) ([name]) : 2D echo on 09/08/2022 showed EF of 60 to 65% with grade 1 diastolic dysfunction.  Patient has elevated BNP 646, but no leg edema or JVD.  Does not seem to have CHF exacerbation. -Patient received 40 mg of Lasix IV in the ED, will not continue IV Lasix. -Continue home Lasix 40 mg daily  Myocardial injury  Troponin level 34 --> 25.  Patient reports pleuritic chest pain, which is likely due to left pleural effusion. -Continue Lipitor -Will not give aspirin since patient is on Eliquis  Atrial fibrillation, chronic (HCC) Heart rate well-controlled. -Continue amiodarone -Cardizem is being held due to  borderline blood pressure -Holding Eliquis for thoracentesis  Chronic kidney disease, stage 3b (HCC) Slight increase in creatinine with GFR of 22 today.  She has creatinine of 2.61 on 3/16 when she was discharged to rehab.  Patient with history of renal transplant x  2. -Nephrology was also consulted -Monitor renal function -Avoid nephrotoxins  HLD (hyperlipidemia) -Continue Lipitor  Essential hypertension Blood pressure currently on softer side. -Holding home Cardizem -Continue with Lasix  Anemia in CKD (chronic kidney disease) Hemoglobin at 10.6 on admission, may be some hemoconcentration as it is back to baseline around 9 today. -Continue to monitor  Kidney transplant status -Continue home Prograf and CellCept   Type II diabetes mellitus with renal manifestations (HCC) Recent A1c of 5.7.  It was listed that patient is taking Levemir 8 units daily along with Humalog.  She has developed hypoglycemia this morning which was initially resistant to p.o. intervention requiring an amp of D50. Patient also has very poor p.o. intake and failure to thrive picture. -Stop Semglee -Very sensitive SSI  Stage IV pressure ulcer of sacral region ([place]) Also started having some pressure injury to left shoulder as patient mostly lean towards left. -Wound care was consulted  Protein-calorie malnutrition, severe Patient also has failure to thrive picture. -Continue with dietary supplements -Palliative care consult-being transition to comfort care        Subjective: Patient appears comfortable and talking with family when seen today.  Physical Exam: Vitals:   09/30/22 0822 09/30/22 1205 09/30/22 2045 09/30/22 2258  BP: 116/75 92/64 125/70 120/72  Pulse: 98 83 96 93  Resp: 16 18 18 20   Temp: 98.3 F (36.8 C) 100.3 F (37.9 C) 99.1 F (37.3 C) 99.3 F (37.4 C)  TempSrc: Oral Oral  Oral  SpO2: 100% (!) 70% 100% (!) 59%  Weight:      Height:       General.  Frail and cachectic elderly lady, in no acute distress. Pulmonary.  Lungs clear bilaterally, normal respiratory effort. CV.  Regular rate and rhythm, no JVD, rub or murmur. Abdomen.  Soft, nontender, nondistended, BS positive. CNS.  Alert and oriented .  No focal neurologic  deficit. Extremities.  No edema, no cyanosis, pulses intact and symmetrical. Psychiatry.  Judgment and insight appears normal.   Data Reviewed: Prior data reviewed  Family Communication: Multiple family members at bedside  Disposition: Status is: Inpatient Remains inpatient appropriate because: Severity of illness  Planned Discharge Destination: SNF versus hospice  Time spent: 40 minutes  This record has been created using Systems analyst. Errors have been sought and corrected,but may not always be located. Such creation errors do not reflect on the standard of care.   Author: Lorella Nimrod, MD 10/01/2022 2:03 PM  For on call review [email or web address].

## 2022-10-01 NOTE — Discharge Summary (Signed)
Physician Discharge Summary   Patient: Jean Johnson MRN: [id number] DOB: [birth year]  Admit date:     09/28/2022  Discharge date: 10/01/22  Discharge Physician: Lorella Nimrod   PCP: Gladstone Lighter, MD   Recommendations at discharge:   Patient is being discharge to [place].  Discharge Diagnoses: Principal Problem:   Pleural effusion on left Active Problems:   Chronic diastolic CHF (congestive heart failure) (HCC)   Myocardial injury   Atrial fibrillation, chronic (HCC)   Chronic kidney disease, stage 3b (HCC)   Essential hypertension   HLD (hyperlipidemia)   Anemia in CKD (chronic kidney disease)   Kidney transplant status   Type II diabetes mellitus with renal manifestations (HCC)   Stage IV pressure ulcer of sacral region ([place])   Protein-calorie malnutrition, severe   Dyspnea  Resolved Problems:   * No resolved hospital problems. [facility] Course: Taken from H&P.  Jean Johnson is a 73 y.o. female with medical history significant of ESRD s/p renal transplant x 2 now having CKD stage IIIb, hypertension, dyslipidemia, hypothyroidism, PAF on anticoagulation with Eliquis, PUD, type 2 diabetes mellitus with diabetic neuropathy as well as other comorbidities who presented to the ED with SOB.   Pt was recently hospitalized from 2/27 - 3/15 due to CHF exacerbation.  Patient is discharged to nursing home for rehab. Patient reports left-sided chest pain, which is sharp, pleuritic, 7 out of 10 in severity, nonradiating.  Patient has nausea, no vomiting, diarrhea or abdominal pain.  No symptoms of UTI.  She took her last dose of Eliquis at 11:33 on 09/27/2022.     Data reviewed independently and ED Course: pt was found to have WBC 11.6, BNP 646, trop  34 --> 25, stable renal function compared to recent baseline creatinine, INR 1.7, temperature normal, soft blood pressure 97/73, heart rate 89, RR 18, oxygen saturation 100% on room air.  Chest x-ray showed large  left pleural effusion with mild interstitial edema. EKG: A flutter, QTc 526, RAD, poor R wave progression.   Thoracentesis was ordered by admitting provider.  3/21: Vital stable.  Significant hypoglycemia with recorded CBG of 33 requiring intervention.  Stopping Semglee and SSI decreased to very sensitive.  Some worsening of renal function.  Nephrology was also consulted.  Pending thoracentesis Patient with overall persistent declining health and recurrent admissions.  Family is now leaning more towards comfort care.  Palliative care consult was ordered. She is very high risk for deterioration and mortality based on significant underlying comorbidities.  3/22: Vital stable.  S/p thoracentesis yesterday with removal of 350 cc fluid.  Preliminary labs appears transudative and preliminary cultures negative so far.  Urine cultures with pansensitive E. Coli. Palliative care meeting today around noon, family decided to proceed with comfort care only.  Most likely be going to hospice facility, family has not make any final decision yet. P.o. intake remains very poor and patient appears very lethargic.  3/23: Patient seems stable with very poor p.o. intake.  Discussed with family and now they agree to consider hospice facility.  Apparently they do not have any experience with this situation.  Hospice to discuss with family and if remains stable then proceed with transfer, depending bed availability.  Patient now had a bed at hospice facility, where she is being discharged for EOL care.  Assessment and Plan: * Pleural effusion on left S/p thoracentesis on 3/21.  No growth on cultures so far Patient presented with large left pleural effusion  which can be contributory to her shortness of breath.  Persistent leukocytosis but no fever. -Patient is not transition to comfort care  Chronic diastolic CHF (congestive heart failure) ([place]) : 2D echo on 09/08/2022 showed EF of 60 to 65% with grade 1 diastolic  dysfunction.  Patient has elevated BNP 646, but no leg edema or JVD.  Does not seem to have CHF exacerbation. -Patient received 40 mg of Lasix IV in the ED, will not continue IV Lasix. -Continue home Lasix 40 mg daily  Myocardial injury  Troponin level 34 --> 25.  Patient reports pleuritic chest pain, which is likely due to left pleural effusion. -Continue Lipitor -Will not give aspirin since patient is on Eliquis  Atrial fibrillation, chronic (HCC) Heart rate well-controlled. -Continue amiodarone -Cardizem is being held due to borderline blood pressure -Holding Eliquis for thoracentesis  Chronic kidney disease, stage 3b (HCC) Slight increase in creatinine with GFR of 22 today.  She has creatinine of 2.61 on 3/16 when she was discharged to rehab.  Patient with history of renal transplant x 2. -Nephrology was also consulted -Monitor renal function -Avoid nephrotoxins  HLD (hyperlipidemia) -Continue Lipitor  Essential hypertension Blood pressure currently on softer side. -Holding home Cardizem -Continue with Lasix  Anemia in CKD (chronic kidney disease) Hemoglobin at 10.6 on admission, may be some hemoconcentration as it is back to baseline around 9 today. -Continue to monitor  Kidney transplant status -Continue home Prograf and CellCept   Type II diabetes mellitus with renal manifestations (HCC) Recent A1c of 5.7.  It was listed that patient is taking Levemir 8 units daily along with Humalog.  She has developed hypoglycemia this morning which was initially resistant to p.o. intervention requiring an amp of D50. Patient also has very poor p.o. intake and failure to thrive picture. -Stop Semglee -Very sensitive SSI  Stage IV pressure ulcer of sacral region ([place]) Also started having some pressure injury to left shoulder as patient mostly lean towards left. -Wound care was consulted  Protein-calorie malnutrition, severe Patient also has failure to thrive picture. -Continue  with dietary supplements -Palliative care consult-being transition to comfort care         Consultants: Palliative Care Procedures performed: None  Disposition: Hospice care Diet recommendation:  Discharge Diet Orders (From admission, onward)     Start     Ordered   10/01/22 0000  Diet - low sodium heart healthy        10/01/22 1559           Regular diet DISCHARGE MEDICATION: Allergies as of 10/01/2022       Reactions   E-mycin [erythromycin] Swelling   And itching   Penicillins Swelling   Throat Tolerated 1st generation cephalosporin (CEFAZOLIN) on 03/24/2021 without documented ADRs.  No reaction with Ancef on 08/30/2022        Medication List     STOP taking these medications    apixaban 2.5 MG Tabs tablet Commonly known as: ELIQUIS   ascorbic acid 500 MG tablet Commonly known as: VITAMIN C   atorvastatin 10 MG tablet Commonly known as: LIPITOR   diltiazem 240 MG 24 hr capsule Commonly known as: CARDIZEM CD   diltiazem 60 MG tablet Commonly known as: CARDIZEM   ezetimibe 10 MG tablet Commonly known as: ZETIA   HumaLOG 100 UNIT/ML injection Generic drug: insulin lispro   HumaLOG Mix 75/25 KwikPen (75-25) 100 UNIT/ML Kwikpen Generic drug: Insulin Lispro Prot & Lispro   insulin detemir 100 UNIT/ML injection Commonly  known as: LEVEMIR   ondansetron 4 MG tablet Commonly known as: ZOFRAN   pregabalin 50 MG capsule Commonly known as: LYRICA       TAKE these medications    acetaminophen 325 MG tablet Commonly known as: TYLENOL Take 2 tablets (650 mg total) by mouth every 6 (six) hours as needed for mild pain (or Fever >/= 101).   amiodarone 200 MG tablet Commonly known as: PACERONE Take 1 tablet (200 mg total) by mouth daily. What changed: Another medication with the same name was removed. Continue taking this medication, and follow the directions you see here.   docusate sodium 100 MG capsule Commonly known as: COLACE Take 1  capsule (100 mg total) by mouth 2 (two) times daily.   furosemide 40 MG tablet Commonly known as: LASIX Take 1 tablet (40 mg total) by mouth daily.   mycophenolate 500 MG tablet Commonly known as: CELLCEPT Take 500 mg by mouth 2 (two) times daily.   ondansetron 4 MG disintegrating tablet Commonly known as: ZOFRAN-ODT Take 4 mg by mouth every 8 (eight) hours as needed.   tacrolimus 1 MG capsule Commonly known as: PROGRAF Take 1 capsule (1 mg total) by mouth 2 (two) times daily.   traMADol 50 MG tablet Commonly known as: ULTRAM Take 25 mg by mouth 3 (three) times daily as needed.   traZODone 50 MG tablet Commonly known as: DESYREL Take 0.5 tablets (25 mg total) by mouth at bedtime as needed for sleep.               Discharge Care Instructions  (From admission, onward)           Start     Ordered   10/01/22 0000  No dressing needed        10/01/22 1559            Discharge Exam: Filed Weights   09/28/22 1227  Weight: 48.5 kg   General.  Frail and cachectic elderly lady, in no acute distress. Pulmonary.  Lungs clear bilaterally, normal respiratory effort. CV.  Regular rate and rhythm, no JVD, rub or murmur. Abdomen.  Soft, nontender, nondistended, BS positive. CNS.  Alert and oriented .  No focal neurologic deficit. Extremities.  No edema, no cyanosis, pulses intact and symmetrical. Psychiatry.  Judgment and insight appears normal.    Condition at discharge: stable  The results of significant diagnostics from this hospitalization (including imaging, microbiology, ancillary and laboratory) are listed below for reference.   Imaging Studies: US THORACENTESIS ASP PLEURAL SPACE W/IMG GUIDE  Result Date: 09/29/2022 INDICATION: Patient with shortness of breath and left-sided pleural effusion request received for diagnostic and therapeutic thoracentesis. EXAM: ULTRASOUND GUIDED LEFT THORACENTESIS MEDICATIONS: Local 1% lidocaine only. COMPLICATIONS: None  immediate. PROCEDURE: An ultrasound guided thoracentesis was thoroughly discussed with the patient and questions answered. The benefits, risks, alternatives and complications were also discussed. The patient understands and wishes to proceed with the procedure. Written consent was obtained. Ultrasound was performed to localize and mark an adequate pocket of fluid in the left chest. The area was then prepped and draped in the normal sterile fashion. 1% Lidocaine was used for local anesthesia. Under ultrasound guidance a 19 gauge, 7-cm, Yueh catheter was introduced. Thoracentesis was performed. The catheter was removed and a dressing applied. FINDINGS: A total of approximately 350 mL of clear pale colored fluid was removed. Samples were sent to the laboratory as requested by the clinical team. IMPRESSION: Successful ultrasound guided left thoracentesis yielding 350  mL of pleural fluid. This exam was performed by Tsosie Billing PA-C, and was supervised and interpreted by Dr. Denna Haggard. Electronically Signed   By: Albin Felling M.D.   On: 09/29/2022 15:49   DG Chest Port 1 View  Result Date: 09/29/2022 CLINICAL DATA:  Status post thoracentesis. EXAM: PORTABLE CHEST 1 VIEW COMPARISON:  September 28, 2022. FINDINGS: No pneumothorax status post left thoracentesis. Left pleural effusion is significantly smaller. IMPRESSION: No pneumothorax status post left thoracentesis. Electronically Signed   By: Marijo Conception M.D.   On: 09/29/2022 15:04   DG Chest 1 View  Result Date: 09/28/2022 CLINICAL DATA:  Dyspnea, intermittent chest pain EXAM: CHEST  1 VIEW COMPARISON:  09/20/2022 FINDINGS: Single frontal view of the chest demonstrates interval increase in left pleural effusion and left basilar consolidation. Cardiac silhouette is obscured. There is increased central vascular congestion, with patchy ground-glass airspace disease seen within the aerated lungs bilaterally. No pneumothorax. No acute bony abnormality. IMPRESSION: 1.  Progressive left pleural effusion and left basilar atelectasis since prior exam. Similar appearance to the 09/08/2022 exam. 2. Mild pulmonary edema. Electronically Signed   By: Randa Ngo M.D.   On: 09/28/2022 13:52   DG Chest Port 1 View  Result Date: 09/20/2022 CLINICAL DATA:  Fever. EXAM: PORTABLE CHEST 1 VIEW COMPARISON:  Chest x-ray dated September 08, 2022. FINDINGS: Chronic cardiomegaly. Mild diffuse interstitial and hazy airspace opacities throughout both lungs have worsened in the interval. Unchanged layering small right pleural effusion with right basilar atelectasis. Decreased now small layering left pleural effusion with continued left lower lobe atelectasis. No pneumothorax. No acute osseous abnormality. IMPRESSION: 1. Worsening pulmonary edema. 2. Decreased now small left pleural effusion. Unchanged small right pleural effusion. Electronically Signed   By: Titus Dubin M.D.   On: 09/20/2022 09:01   ECHOCARDIOGRAM COMPLETE  Result Date: 09/09/2022    ECHOCARDIOGRAM REPORT   Patient Name:   HILDE CASHMORE DAVIS Date of Exam: 09/08/2022 Medical Rec #:  [id number]           Height:       62.0 in Accession #:    [id number]          Weight:       112.0 lb Date of Birth:  [birth year]           BSA:          1.494 m Patient Age:    49 years            BP:           139/67 mmHg Patient Gender: F                   HR:           68 bpm. Exam Location:  [facility] Procedure: 2D Echo, Cardiac Doppler and Color Doppler Indications:     CHF-acute diastolic [id number]  History:         Patient has prior history of Echocardiogram examinations, most                  recent 04/29/2022. Risk Factors:Hypertension and Dyslipidemia.  Sonographer:     Sherrie Sport Referring Phys:  [id number] Hurley TANG Diagnosing Phys: Isaias Cowman MD IMPRESSIONS  1. Left ventricular ejection fraction, by estimation, is 60 to 65%. The left ventricle has normal function. The left ventricle has no regional wall motion abnormalities.  There is moderate concentric left ventricular hypertrophy. Left ventricular diastolic parameters are consistent  with Grade I diastolic dysfunction (impaired relaxation).  2. Right ventricular systolic function is normal. The right ventricular size is normal.  3. A small pericardial effusion is present. Moderate pleural effusion.  4. The mitral valve is normal in structure. No evidence of mitral valve regurgitation. No evidence of mitral stenosis.  5. Tricuspid valve regurgitation is mild to moderate.  6. The aortic valve is normal in structure. Aortic valve regurgitation is mild. Mild aortic valve stenosis.  7. The inferior vena cava is normal in size with greater than 50% respiratory variability, suggesting right atrial pressure of 3 mmHg. FINDINGS  Left Ventricle: Left ventricular ejection fraction, by estimation, is 60 to 65%. The left ventricle has normal function. The left ventricle has no regional wall motion abnormalities. The left ventricular internal cavity size was normal in size. There is  moderate concentric left ventricular hypertrophy. Left ventricular diastolic parameters are consistent with Grade I diastolic dysfunction (impaired relaxation). Right Ventricle: The right ventricular size is normal. No increase in right ventricular wall thickness. Right ventricular systolic function is normal. Left Atrium: Left atrial size was normal in size. Right Atrium: Right atrial size was normal in size. Pericardium: A small pericardial effusion is present. Mitral Valve: The mitral valve is normal in structure. No evidence of mitral valve regurgitation. No evidence of mitral valve stenosis. Tricuspid Valve: The tricuspid valve is normal in structure. Tricuspid valve regurgitation is mild to moderate. No evidence of tricuspid stenosis. Aortic Valve: The aortic valve is normal in structure. Aortic valve regurgitation is mild. Aortic regurgitation PHT measures 508 msec. Mild aortic stenosis is present. Aortic valve  mean gradient measures 9.8 mmHg. Aortic valve peak gradient measures 18.0  mmHg. Aortic valve area, by VTI measures 2.00 cm. Pulmonic Valve: The pulmonic valve was normal in structure. Pulmonic valve regurgitation is not visualized. No evidence of pulmonic stenosis. Aorta: The aortic root is normal in size and structure. Venous: The inferior vena cava is normal in size with greater than 50% respiratory variability, suggesting right atrial pressure of 3 mmHg. IAS/Shunts: No atrial level shunt detected by color flow Doppler. Additional Comments: There is a moderate pleural effusion.  LEFT VENTRICLE PLAX 2D LVIDd:         4.00 cm   Diastology LVIDs:         2.60 cm   LV e' medial:    7.07 cm/s LV PW:         1.80 cm   LV E/e' medial:  14.7 LV IVS:        1.50 cm   LV e' lateral:   9.36 cm/s LVOT diam:     2.00 cm   LV E/e' lateral: 11.1 LV SV:         71 LV SV Index:   48 LVOT Area:     3.14 cm  RIGHT VENTRICLE RV Basal diam:  2.70 cm RV Mid diam:    1.50 cm RV S prime:     11.60 cm/s TAPSE (M-mode): 1.7 cm LEFT ATRIUM             Index        RIGHT ATRIUM           Index LA diam:        4.60 cm 3.08 cm/m   RA Area:     16.40 cm LA Vol (A2C):   37.8 ml 25.30 ml/m  RA Volume:   42.20 ml  28.24 ml/m LA Vol (A4C):   70.1  ml 46.92 ml/m LA Biplane Vol: 55.7 ml 37.28 ml/m  AORTIC VALVE AV Area (Vmax):    1.66 cm AV Area (Vmean):   1.86 cm AV Area (VTI):     2.00 cm AV Vmax:           212.00 cm/s AV Vmean:          142.750 cm/s AV VTI:            0.356 m AV Peak Grad:      18.0 mmHg AV Mean Grad:      9.8 mmHg LVOT Vmax:         112.00 cm/s LVOT Vmean:        84.500 cm/s LVOT VTI:          0.226 m LVOT/AV VTI ratio: 0.64 AI PHT:            508 msec  AORTA Ao Root diam: 2.30 cm MITRAL VALVE                TRICUSPID VALVE MV Area (PHT): 2.88 cm     TR Peak grad:   36.0 mmHg MV Decel Time: 263 msec     TR Vmax:        300.00 cm/s MV E velocity: 104.00 cm/s MV A velocity: 33.00 cm/s   SHUNTS MV E/A ratio:  3.15          Systemic VTI:  0.23 m                             Systemic Diam: 2.00 cm Isaias Cowman MD Electronically signed by Isaias Cowman MD Signature Date/Time: 09/09/2022/7:58:45 AM    Final    DG Chest Port 1 View  Result Date: 09/08/2022 CLINICAL DATA:  Acute on chronic congestive heart failure. EXAM: PORTABLE CHEST 1 VIEW COMPARISON:  09/06/22 FINDINGS: Stable cardiomediastinal contours. Increased volume of left pleural effusion. Now moderate to large. Small right pleural effusion is improved in the interval. Similar appearance of mild diffuse interstitial edema. IMPRESSION: 1. Increased volume of left pleural effusion. 2. Improved right pleural effusion. 3. Persistent mild interstitial edema. Electronically Signed   By: Kerby Moors M.D.   On: 09/08/2022 08:16   NM Pulmonary Perfusion  Result Date: 09/07/2022 CLINICAL DATA:  Pulmonary embolism suspected, low to intermediate probability. Abnormal D-dimer. EXAM: NUCLEAR MEDICINE PERFUSION LUNG SCAN TECHNIQUE: Perfusion images were obtained in multiple projections after intravenous injection of radiopharmaceutical. Ventilation scans intentionally deferred if perfusion scan and chest x-ray adequate for interpretation during COVID 19 epidemic. RADIOPHARMACEUTICALS:  4.17 mCi Tc-29m MAA IV COMPARISON:  Chest radiography yesterday FINDINGS: No segmental or subsegmental defects suggestive of embolic disease. Defects related to layering pleural effusions. IMPRESSION: No finding to suggest pulmonary emboli. Layering pleural effusions, but no sign of arterial distribution hypoperfusion. Electronically Signed   By: Nelson Chimes M.D.   On: 09/07/2022 08:59   US Venous Img Lower Bilateral (DVT)  Result Date: 09/06/2022 CLINICAL DATA:  Leg swelling bilateral EXAM: Bilateral LOWER EXTREMITY VENOUS DOPPLER ULTRASOUND TECHNIQUE: Gray-scale sonography with compression, as well as color and duplex ultrasound, were performed to evaluate the deep venous system(s)  from the level of the common femoral vein through the popliteal and proximal calf veins. COMPARISON:  None Available. FINDINGS: VENOUS Normal compressibility of the common femoral, superficial femoral, and popliteal veins, as well as the visualized calf veins. Visualized portions of profunda femoral vein and great saphenous vein unremarkable.  No filling defects to suggest DVT on grayscale or color Doppler imaging. Doppler waveforms show normal direction of venous flow, normal respiratory plasticity and response to augmentation. Limited views of the contralateral common femoral vein are unremarkable. OTHER Scattered soft tissue edema Limitations: none IMPRESSION: No evidence of bilateral lower extremity DVT.  Soft tissue edema Electronically Signed   By: Jill Side M.D.   On: 09/06/2022 18:57   DG Chest Port 1 View  Result Date: 09/06/2022 CLINICAL DATA:  Shortness of breath EXAM: PORTABLE CHEST 1 VIEW COMPARISON:  Chest x-ray 04/29/2022 FINDINGS: Small bilateral pleural effusions are present, left greater than right. There central pulmonary vascular congestion. There are patchy opacities in the lung bases. The heart is enlarged, unchanged. There is no pneumothorax or acute fracture. Cervical spinal fusion plate is present. IMPRESSION: 1. Cardiomegaly with central pulmonary vascular congestion and small bilateral pleural effusions. 2. Patchy opacities in the lung bases may reflect edema or infection. Electronically Signed   By: Ronney Asters M.D.   On: 09/06/2022 17:32    Microbiology: Results for orders placed or performed during the hospital encounter of 09/28/22  Body fluid culture w Gram Stain     Status: None (Preliminary result)   Collection Time: 09/29/22  2:59 PM   Specimen: PATH Cytology Pleural fluid  Result Value Ref Range Status   Specimen Description   Final    PLEURAL Performed at [facility], [address], [place], [place] [address]    Special Requests   Final     PLEURAL Performed at [facility], [address] [place], [place] [address]    Gram Stain NO WBC SEEN NO ORGANISMS SEEN CYTOSPIN SMEAR   Final   Culture   Final    NO GROWTH 2 DAYS Performed at [facility], [place] [address], [place], [place] [address]    Report Status PENDING  Incomplete    Labs: CBC: Recent Labs  Lab 09/28/22 1444 09/29/22 0631  WBC 11.6* 11.1*  NEUTROABS 10.0*  --   HGB 10.6* 9.1*  HCT 35.2* 30.3*  MCV 99.4 101.0*  PLT 245 [id number]   Basic Metabolic Panel: Recent Labs  Lab 09/28/22 1444 09/29/22 0631  NA 146* 146*  K 4.0 4.0  CL 110 115*  CO2 26 24  GLUCOSE 100* 70  BUN 77* 80*  CREATININE 2.13* 2.27*  CALCIUM 9.1 8.6*  MG  --  2.4   Liver Function Tests: Recent Labs  Lab 09/28/22 1444  AST 17  ALT 9  ALKPHOS 89  BILITOT 0.4  PROT 5.7*  ALBUMIN 2.2*   CBG: Recent Labs  Lab 09/29/22 1209 09/29/22 1705 09/29/22 2056 09/30/22 0812 09/30/22 1155  GLUCAP 130* 128* 100* 85 80    Discharge time spent: greater than 30 minutes.  This record has been created using Systems analyst. Errors have been sought and corrected,but may not always be located. Such creation errors do not reflect on the standard of care.   Signed: Lorella Nimrod, MD [facility] 10/01/2022

## 2022-10-02 LAB — CHOLESTEROL, BODY FLUID: Cholesterol, Fluid: 20 mg/dL

## 2022-10-03 LAB — BODY FLUID CULTURE W GRAM STAIN
Culture: NO GROWTH
Gram Stain: NONE SEEN

## 2022-10-03 LAB — TACROLIMUS LEVEL: Tacrolimus (FK506) - LabCorp: 4.8 ng/mL (ref 2.0–20.0)

## 2022-10-03 LAB — CYTOLOGY - NON PAP

## 2022-10-05 LAB — MISC LABCORP TEST (SEND OUT)
LabCorp test name: 5367
Labcorp test code: 5367

## 2022-10-10 DEATH — deceased

## 2022-11-06 IMAGING — MR MR PELVIS WO/W CM
7 of 8 series · 42 of 48 positions shown · IV contrast (gadavist)
Comparison: None.

CLINICAL DATA: Anal lesion with intermittent drainage.

EXAM:
MRI PELVIS WITHOUT AND WITH CONTRAST
TECHNIQUE: Multiplanar multisequence MR imaging of the pelvis was performed
both before and after administration of intravenous contrast.
CONTRAST:  5mL GADAVIST GADOBUTROL 1 MMOL/ML IV SOLN

[Series 3: sag t2_in · sagittal · 2.5mm · 0.81mm/px · 7 of 45 slices shown]
[im 1/45]
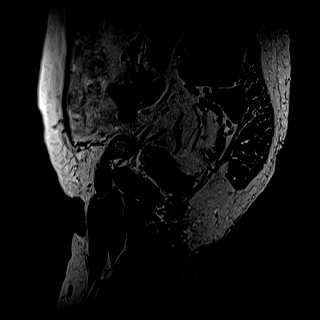
[im 8/45]
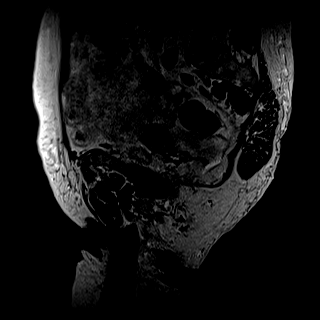
[im 15/45]
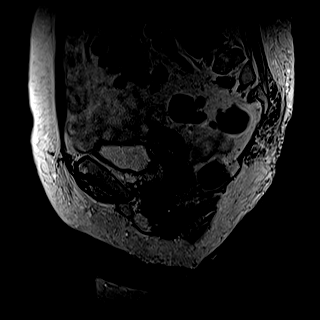
[im 23/45]
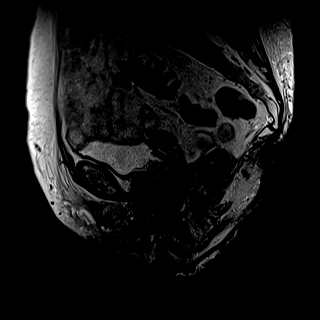
[im 30/45]
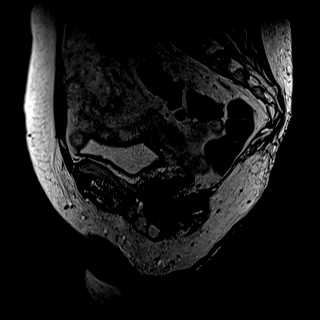
[im 37/45]
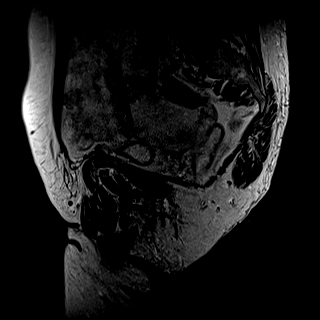
[im 45/45]
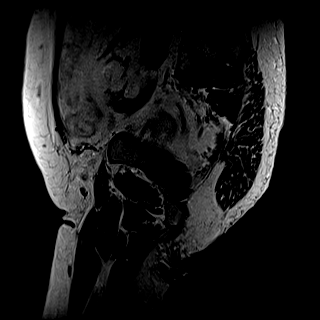

[Series 5: sag t2_w · sagittal · 2.5mm · 0.81mm/px · 7 of 45 slices shown]
[im 1/45]
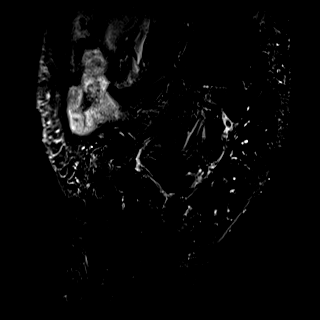
[im 8/45]
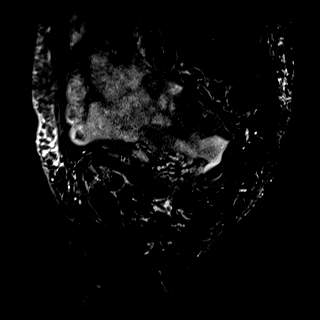
[im 15/45]
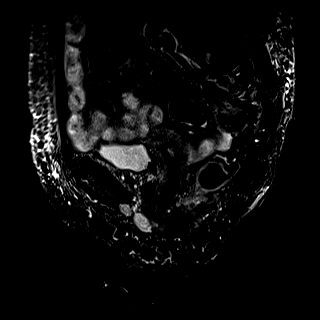
[im 23/45]
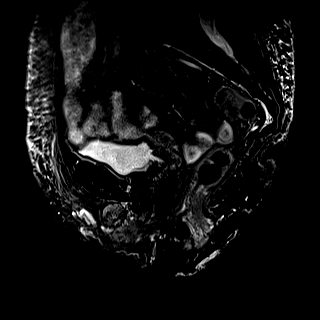
[im 30/45]
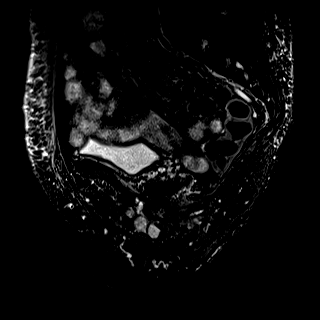
[im 37/45]
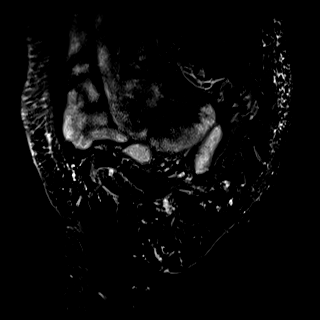
[im 45/45]
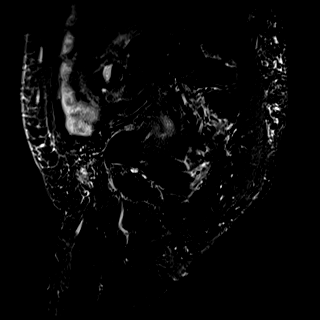

[Series 6: T1 · axial · 4.0mm · 0.57mm/px · z∈[-44,+105]mm · 6 of 37 slices shown]
[im 1/37]
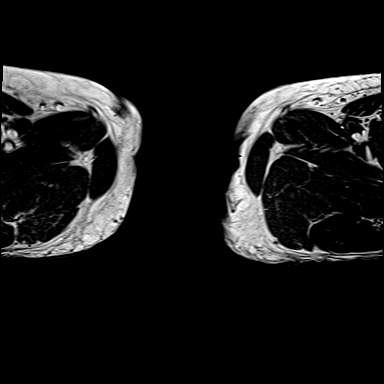
[im 8/37]
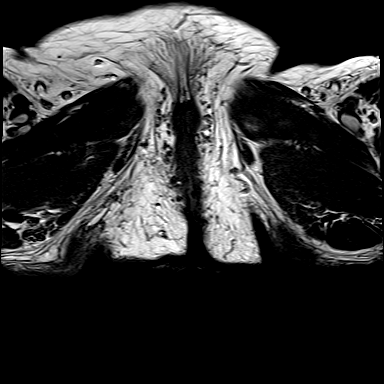
[im 15/37]
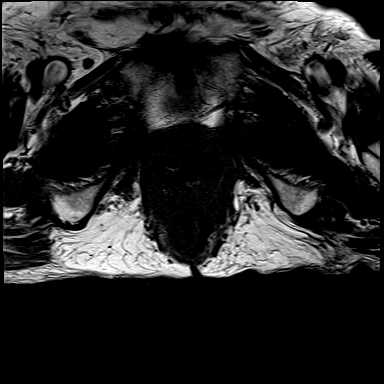
[im 22/37]
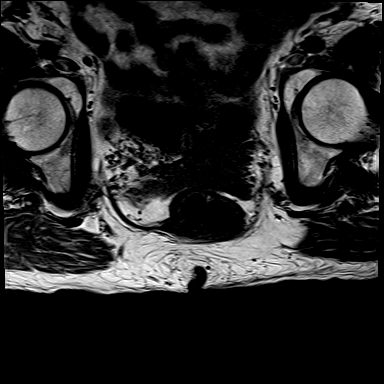
[im 29/37]
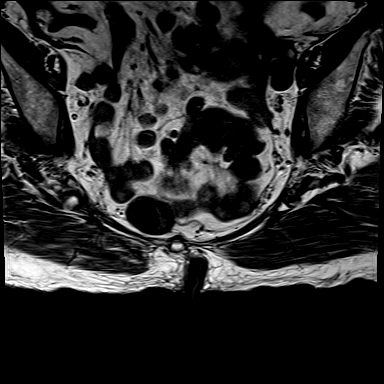
[im 37/37]
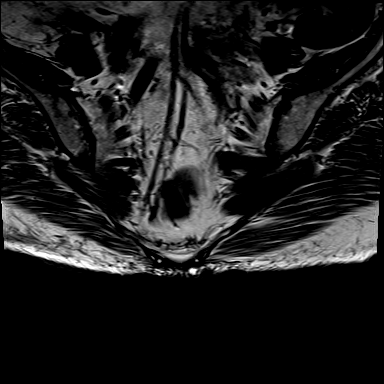

[Series 7: ax t2_in · axial · 4.0mm · 0.75mm/px · z∈[-38,+110]mm · 6 of 37 slices shown]
[im 1/37]
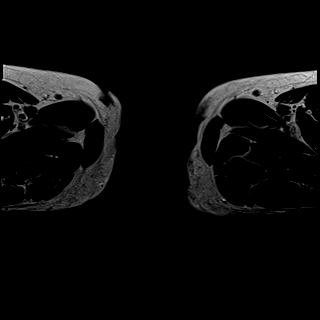
[im 8/37]
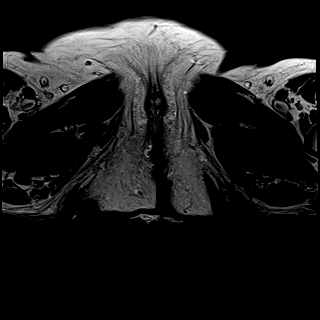
[im 15/37]
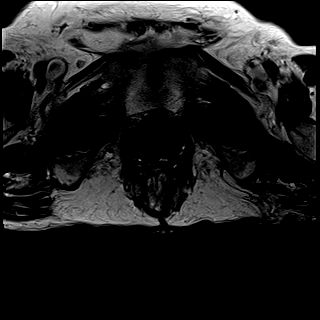
[im 22/37]
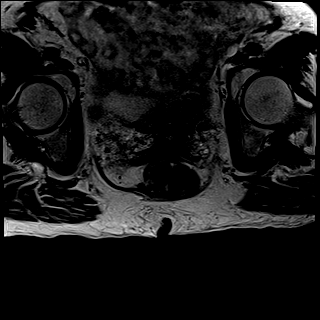
[im 29/37]
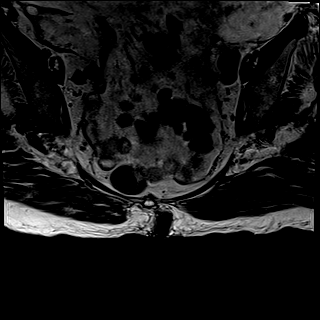
[im 37/37]
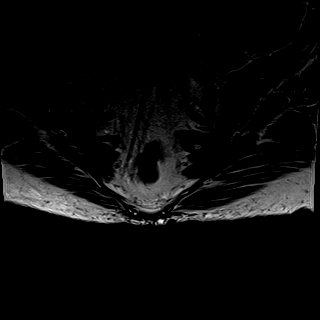

[Series 9: ax t2_w · axial · 4.0mm · 0.75mm/px · z∈[-38,+110]mm · 6 of 37 slices shown]
[im 1/37]
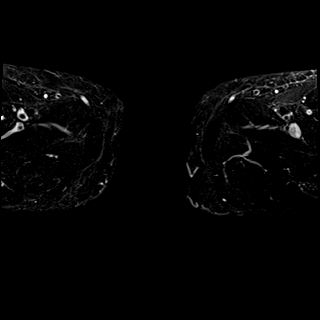
[im 8/37]
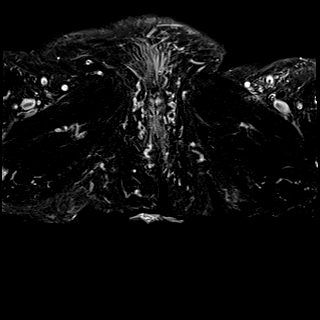
[im 15/37]
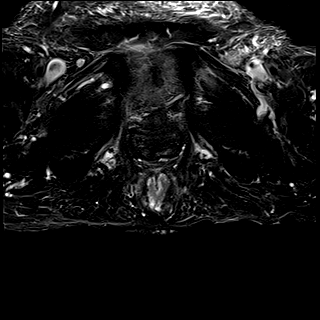
[im 22/37]
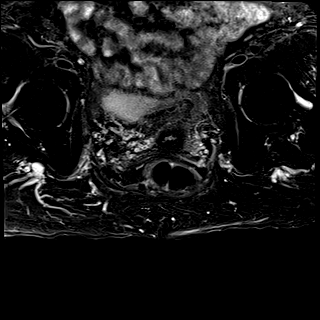
[im 29/37]
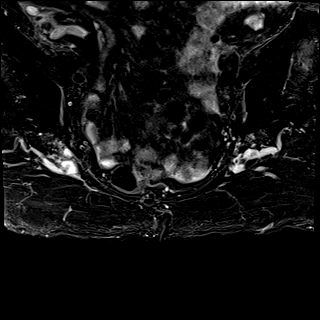
[im 37/37]
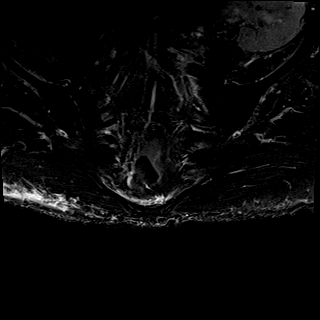

[Series 10: T2 fat-sat · coronal · 4.0mm · 0.69mm/px · 5 of 29 slices shown]
[im 1/29]
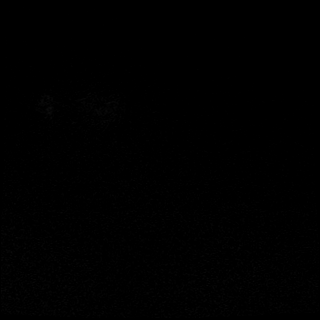
[im 8/29]
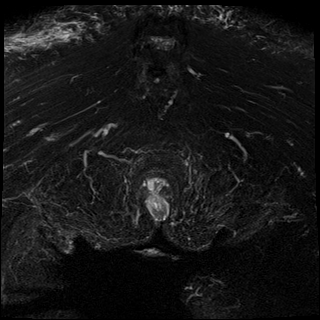
[im 15/29]
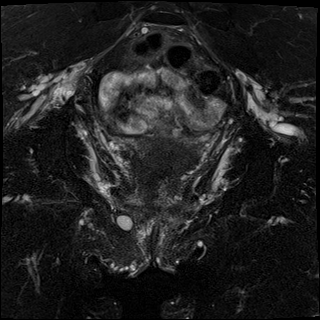
[im 22/29]
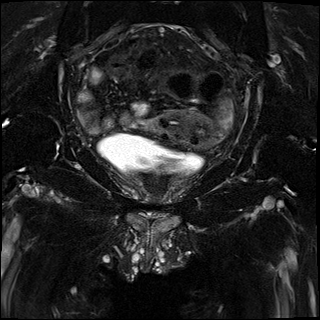
[im 29/29]
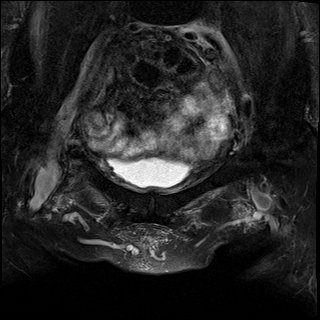

[Series 11: T1 fat-sat post-contrast · axial · 4.0mm · 0.57mm/px · z∈[-44,+72]mm · 5 of 37 slices shown]
[im 1/37]
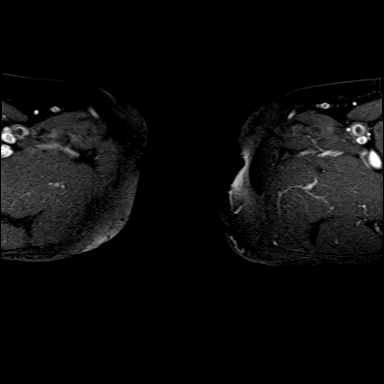
[im 8/37]
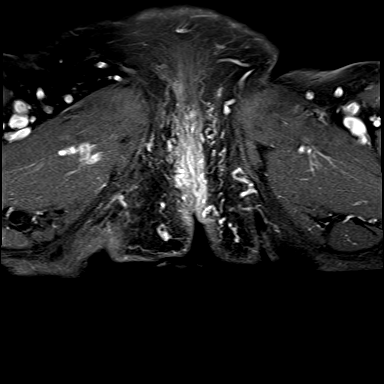
[im 15/37]
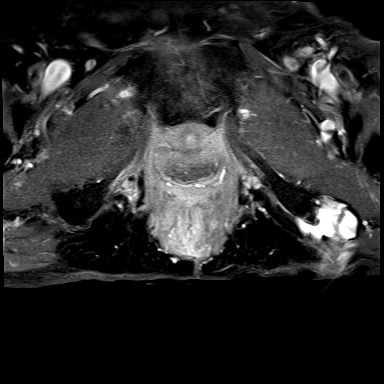
[im 22/37]
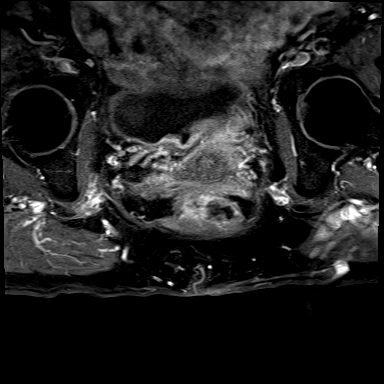
[im 29/37]
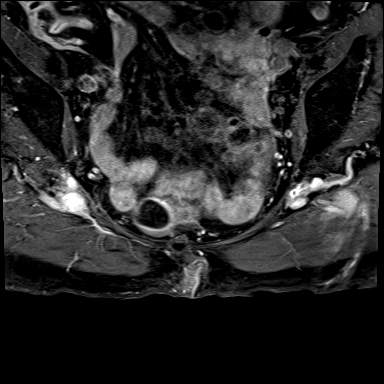

[42 of 48 positions shown; findings below may reference images not displayed]

FINDINGS: Lower Urinary Tract: Unremarkable urinary bladder.

Bowel: Severe sigmoid diverticulosis is seen, without evidence of
diverticulitis in this region. Moderate hemorrhoids are noted at the
distal anal sphincter. No evidence of perianal fistula or abscess.

Vascular/Lymphatic: No pathologically enlarged lymph nodes or other
significant abnormality seen in lower pelvis.

Reproductive: Uterus measures 6.3 x 3.0 x 6.0 cm. Several tiny less
than 1 cm uterine fibroids are seen. Septate uterus also noted.
Endometrial thickness measures 5 mm. Cervix and vagina are
unremarkable in appearance. No evidence of adnexal mass or free
fluid. 11 mm cyst is seen in the right lateral wall of the inferior
vagina, consistent with a small Bartholin's gland cyst.

Other: None.

Musculoskeletal: No significant abnormality identified.
IMPRESSION: No evidence of perianal fistula or abscess.

Moderate hemorrhoids at the distal anal sphincter.

Severe sigmoid diverticulosis, without evidence of diverticulitis.

Several tiny less than 1 cm uterine fibroids.

11 mm Bartholin's gland cyst in the right lateral wall of the
inferior vagina.
